# Patient Record
Sex: Male | Born: 1960 | Race: Black or African American | Hispanic: No | Smoking: Current every day smoker
Health system: Southern US, Community
[De-identification: ages and names within clinical notes are randomized; demographics above are authoritative.]

## PROBLEM LIST (undated history)

## (undated) DIAGNOSIS — Z91148 Patient's other noncompliance with medication regimen for other reason: Secondary | ICD-10-CM

## (undated) DIAGNOSIS — R0600 Dyspnea, unspecified: Secondary | ICD-10-CM

## (undated) DIAGNOSIS — G8929 Other chronic pain: Secondary | ICD-10-CM

## (undated) DIAGNOSIS — Z9289 Personal history of other medical treatment: Secondary | ICD-10-CM

## (undated) DIAGNOSIS — I509 Heart failure, unspecified: Secondary | ICD-10-CM

## (undated) DIAGNOSIS — M1990 Unspecified osteoarthritis, unspecified site: Secondary | ICD-10-CM

## (undated) DIAGNOSIS — N189 Chronic kidney disease, unspecified: Secondary | ICD-10-CM

## (undated) DIAGNOSIS — Z87828 Personal history of other (healed) physical injury and trauma: Secondary | ICD-10-CM

## (undated) DIAGNOSIS — E119 Type 2 diabetes mellitus without complications: Secondary | ICD-10-CM

## (undated) DIAGNOSIS — I499 Cardiac arrhythmia, unspecified: Secondary | ICD-10-CM

## (undated) DIAGNOSIS — E161 Other hypoglycemia: Secondary | ICD-10-CM

## (undated) DIAGNOSIS — F419 Anxiety disorder, unspecified: Secondary | ICD-10-CM

## (undated) DIAGNOSIS — M549 Dorsalgia, unspecified: Secondary | ICD-10-CM

## (undated) DIAGNOSIS — E785 Hyperlipidemia, unspecified: Secondary | ICD-10-CM

## (undated) DIAGNOSIS — E1121 Type 2 diabetes mellitus with diabetic nephropathy: Secondary | ICD-10-CM

## (undated) DIAGNOSIS — I10 Essential (primary) hypertension: Secondary | ICD-10-CM

## (undated) DIAGNOSIS — D649 Anemia, unspecified: Secondary | ICD-10-CM

## (undated) DIAGNOSIS — Q845 Enlarged and hypertrophic nails: Secondary | ICD-10-CM

## (undated) DIAGNOSIS — Z9114 Patient's other noncompliance with medication regimen: Secondary | ICD-10-CM

## (undated) HISTORY — PX: OTHER SURGICAL HISTORY: SHX169

## (undated) HISTORY — PX: VASCULAR SURGERY: SHX849

---
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
## 2009-08-08 ENCOUNTER — Emergency Department: Payer: Self-pay | Admitting: Emergency Medicine

## 2009-08-15 ENCOUNTER — Emergency Department: Payer: Self-pay | Admitting: Emergency Medicine

## 2010-02-14 ENCOUNTER — Emergency Department: Payer: Self-pay | Admitting: Emergency Medicine

## 2010-09-21 ENCOUNTER — Emergency Department: Payer: Self-pay | Admitting: Emergency Medicine

## 2010-09-25 ENCOUNTER — Emergency Department: Payer: Self-pay | Admitting: Emergency Medicine

## 2015-02-04 ENCOUNTER — Encounter: Payer: Self-pay | Admitting: Emergency Medicine

## 2015-02-04 ENCOUNTER — Emergency Department
Admission: EM | Admit: 2015-02-04 | Discharge: 2015-02-04 | Disposition: A | Discharge status: Home / Self Care | Payer: Self-pay | Attending: Emergency Medicine | Admitting: Emergency Medicine

## 2015-02-04 DIAGNOSIS — Z79899 Other long term (current) drug therapy: Secondary | ICD-10-CM | POA: Insufficient documentation

## 2015-02-04 DIAGNOSIS — E1122 Type 2 diabetes mellitus with diabetic chronic kidney disease: Secondary | ICD-10-CM

## 2015-02-04 DIAGNOSIS — E1165 Type 2 diabetes mellitus with hyperglycemia: Secondary | ICD-10-CM

## 2015-02-04 DIAGNOSIS — R202 Paresthesia of skin: Secondary | ICD-10-CM | POA: Insufficient documentation

## 2015-02-04 DIAGNOSIS — E119 Type 2 diabetes mellitus without complications: Secondary | ICD-10-CM | POA: Insufficient documentation

## 2015-02-04 DIAGNOSIS — E1169 Type 2 diabetes mellitus with other specified complication: Secondary | ICD-10-CM

## 2015-02-04 DIAGNOSIS — Q845 Enlarged and hypertrophic nails: Secondary | ICD-10-CM | POA: Insufficient documentation

## 2015-02-04 LAB — CBC WITH DIFFERENTIAL/PLATELET
BASOS ABS: 0 10*3/uL (ref 0–0.1)
BASOS PCT: 1 %
Eosinophils Absolute: 0.1 10*3/uL (ref 0–0.7)
Eosinophils Relative: 2 %
HCT: 37 % — ABNORMAL LOW (ref 40.0–52.0)
HEMOGLOBIN: 12.4 g/dL — AB (ref 13.0–18.0)
Lymphocytes Relative: 47 %
Lymphs Abs: 3.5 10*3/uL (ref 1.0–3.6)
MCH: 28 pg (ref 26.0–34.0)
MCHC: 33.7 g/dL (ref 32.0–36.0)
MCV: 83.4 fL (ref 80.0–100.0)
Monocytes Absolute: 0.4 10*3/uL (ref 0.2–1.0)
Monocytes Relative: 6 %
NEUTROS ABS: 3.4 10*3/uL (ref 1.4–6.5)
Neutrophils Relative %: 46 %
Platelets: 230 10*3/uL (ref 150–440)
RBC: 4.44 MIL/uL (ref 4.40–5.90)
RDW: 14.6 % — AB (ref 11.5–14.5)
WBC: 7.4 10*3/uL (ref 3.8–10.6)

## 2015-02-04 LAB — URINALYSIS COMPLETE WITH MICROSCOPIC (ARMC ONLY)
BACTERIA UA: NONE SEEN
Bilirubin Urine: NEGATIVE
Ketones, ur: NEGATIVE mg/dL
Leukocytes, UA: NEGATIVE
NITRITE: NEGATIVE
Protein, ur: 100 mg/dL — AB
Specific Gravity, Urine: 1.03 (ref 1.005–1.030)
pH: 5 (ref 5.0–8.0)

## 2015-02-04 LAB — BASIC METABOLIC PANEL
Anion gap: 12 (ref 5–15)
BUN: 14 mg/dL (ref 6–20)
CHLORIDE: 95 mmol/L — AB (ref 101–111)
CO2: 27 mmol/L (ref 22–32)
CREATININE: 0.92 mg/dL (ref 0.61–1.24)
Calcium: 9.8 mg/dL (ref 8.9–10.3)
GFR calc Af Amer: >60 mL/min (ref >60)
GFR calc non Af Amer: >60 mL/min (ref >60)
GLUCOSE: 437 mg/dL — AB (ref 65–99)
POTASSIUM: 3.2 mmol/L — AB (ref 3.5–5.1)
Sodium: 134 mmol/L — ABNORMAL LOW (ref 135–145)

## 2015-02-04 LAB — FERRITIN: Ferritin: 82 ng/mL (ref 24–336)

## 2015-02-04 LAB — GLUCOSE, CAPILLARY: Glucose-Capillary: 293 mg/dL — ABNORMAL HIGH (ref 65–99)

## 2015-02-04 MED ORDER — METFORMIN HCL 500 MG PO TABS: 500.0000 mg | ORAL_TABLET | Freq: Every day | ORAL | Status: DC

## 2015-02-04 MED ORDER — TRAMADOL HCL 50 MG PO TABS
ORAL_TABLET | ORAL | Status: AC
  Filled 2015-02-04: qty 1

## 2015-02-04 MED ORDER — TRAMADOL HCL 50 MG PO TABS
50.0000 mg | ORAL_TABLET | Freq: Once | ORAL | Status: AC
  Administered 2015-02-04: 50 mg via ORAL

## 2015-02-04 MED ORDER — SODIUM CHLORIDE 0.9 % IV BOLUS (SEPSIS)
1000.0000 mL | Freq: Once | INTRAVENOUS | Status: AC
  Administered 2015-02-04: 1000 mL via INTRAVENOUS

## 2015-02-04 MED ORDER — TRAZODONE HCL 50 MG PO TABS: 50.0000 mg | ORAL_TABLET | Freq: Every evening | ORAL | Status: DC | PRN

## 2015-02-04 NOTE — ED Notes (Signed)
Called lab about results of labwork; states that ferritin and BMP can be run off of labs sent

## 2015-02-04 NOTE — ED Notes (Signed)
C/o bilateral leg pain for the past 8 months or so.  Reports pain worse at night.  Concerned that he has restless leg.

## 2015-02-04 NOTE — ED Provider Notes (Signed)
[facility] Emergency Department Provider Note ____________________________________________  Time seen: 1644  I have reviewed the triage vital signs and the nursing notes.  HISTORY  Chief Complaint Leg Pain  HPI Steve Vance is a 54 y.o. male reports to the ED with his wife, with complaints of bilateral leg pain, right greater than left, for the last 8 months. He describes the pain being worse at night, noting burning and pins and needles in the legs. He describes the pain starts at his knees and works up towards his waist. He believes his symptoms are due to restless leg syndrome, this after he saw and infomercial in his wife's doctor's office waiting room last week. He is here today for evaluation management of his symptoms noting his pain keeps him awake at night. He has not dosed Tylenol, Advil, and over-the-counter sleep aids with no relief. He denies out right leg weakness, foot drop or difficulty walking.  History reviewed. No pertinent past medical history.  Patient Active Problem List   Diagnosis Date Noted  . 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 New onset type 2 diabetes mellitus 02/04/2015   History reviewed. No pertinent past surgical history.  Current Outpatient Rx  Name  Route  Sig  Dispense  Refill  . metFORMIN (GLUCOPHAGE) 500 MG tablet   Oral   Take 1 tablet (500 mg total) by mouth daily with breakfast.   30 tablet   0   . traZODone (DESYREL) 50 MG tablet   Oral   Take 1 tablet (50 mg total) by mouth at bedtime as needed for sleep.   20 tablet   0    Allergies Percocet  No family history on file.  Social History History  Substance Use Topics  . Smoking status: Never Smoker   . Smokeless tobacco: Not on file  . Alcohol Use: No   Review of Systems  Constitutional: Negative for fever. Eyes: Negative for visual changes. ENT: Negative for sore throat. Cardiovascular: Negative for chest pain. Respiratory: Negative for  shortness of breath. Gastrointestinal: Negative for abdominal pain, vomiting and diarrhea. Genitourinary: Negative for dysuria. Musculoskeletal: Negative for back pain. Positive for bilateral leg pain. Skin: Negative for rash. Neurological: Negative for headaches, focal weakness or numbness. ____________________________________________  PHYSICAL EXAM:  VITAL SIGNS: ED Triage Vitals  Enc Vitals Group     BP 02/04/15 1557 162/95 mmHg     Pulse Rate 02/04/15 1557 100     Resp 02/04/15 1557 22     Temp 02/04/15 1557 98.1 F (36.7 C)     Temp Source 02/04/15 1557 Oral     SpO2 02/04/15 1557 99 %     Weight 02/04/15 1557 250 lb (113.399 kg)     Height 02/04/15 1557 $RemoveBefor'5\' 11"'[id number]$  (1.803 m)     Head Cir --      Peak Flow --      Pain Score 02/04/15 1558 8     Pain Loc --      Pain Edu? --      Excl. in [place]? --    Constitutional: Alert and oriented. Well appearing and in no distress. Eyes: Conjunctivae are normal. PERRL. Normal extraocular movements. ENT   Head: Normocephalic and atraumatic.   Nose: No congestion/rhinnorhea.   Mouth/Throat: Mucous membranes are moist.   Neck: No stridor. Hematological/Lymphatic/Immunilogical: No cervical lymphadenopathy. Cardiovascular: Normal rate, regular rhythm. Normal distal pulses bilaterally. Respiratory: Normal respiratory effort.No wheezes/rales/rhonchi. Gastrointestinal: Soft and nontender. No distention. Musculoskeletal: Nontender with normal range of  motion in all extremities. No lower extremity tenderness nor edema.  Neurologic:  Normal speech and language. No gross focal neurologic deficits are appreciated. Normal BLE DTRs. Skin:  Skin is warm, dry and intact. No rash noted. Bilateral feet with severely overgrown, hypertrophic toenails. Psychiatric: Mood and affect are normal. Patient exhibits appropriate insight and judgment. ____________________________________________    LABS (pertinent positives/negatives) Labs Reviewed   CBC WITH DIFFERENTIAL/PLATELET - Abnormal; Notable for the following:    Hemoglobin 12.4 (*)    HCT 37.0 (*)    RDW 14.6 (*)    All other components within normal limits  BASIC METABOLIC PANEL - Abnormal; Notable for the following:    Sodium 134 (*)    Potassium 3.2 (*)    Chloride 95 (*)    Glucose, Bld 437 (*)    All other components within normal limits  URINALYSIS COMPLETEWITH MICROSCOPIC ([facility] ONLY) - Abnormal; Notable for the following:    Color, Urine YELLOW (*)    APPearance CLEAR (*)    Glucose, UA >500 (*)    Hgb urine dipstick 2+ (*)    Protein, ur 100 (*)    Squamous Epithelial / LPF 0-5 (*)    All other components within normal limits  GLUCOSE, CAPILLARY - Abnormal; Notable for the following:    Glucose-Capillary 293 (*)    All other components within normal limits  FERRITIN  HEMOGLOBIN A1C   Hgb A1c not available at time of discharge per lab. ____________________________________________  PROCEDURES   Ultram 50 mg PO  NS 1000 ml bolus x 1 ____________________________________________  INITIAL IMPRESSION / ASSESSMENT AND PLAN / ED COURSE  Clinical presentation of BLE paresthesias and insomnia due to symptoms. Patient and wife advised that there is no specific diagnostic test for RLS. Will check baseline labs and consider treatment for pain & insomnia with Trazodone. Patient to follow-up with [facility] as scheduled on February 17, 2015.  ----------------------------------------- 6:57 PM on 02/04/2015 ----------------------------------------- Labs resulted and reviewed with patient. Elevated serum glucose, consistent with probable new onset diabetes. Will add Hgb A1c and U/A. Patient now admits to nocturia and increased thirst.   Reviewed labs including UA and capillary blood sugar following IV fluid bolus with patient and wife. Symptoms, labs and exam consistent with new onset diabetes.  Patient verbalizes understanding of serious nature of this  diagnosis.  He will keep his appointment and start Metformin 500 mg PO daily.  Paresthesias may be related to new diagnosis. ____________________________________________  FINAL CLINICAL IMPRESSION(S) / ED DIAGNOSES  Final diagnoses:  Paresthesia of both lower extremities  Congenital hypertrophic nails  New onset type 2 diabetes mellitus     Melvenia Needles, PA-C 02/05/15 0033  Orbie Pyo, MD 02/06/15 [phone]

## 2015-02-04 NOTE — Discharge Instructions (Signed)
Paresthesia Paresthesia is a burning or prickling feeling. This feeling can happen in any part of the body. It often happens in the hands, arms, legs, or feet. HOME CARE  Avoid drinking alcohol.  Try massage or needle therapy (acupuncture) to help with your problems.  Keep all doctor visits as told. GET HELP RIGHT AWAY IF:   You feel weak.  You have trouble walking or moving.  You have problems speaking or seeing.  You feel confused.  You cannot control when you poop (bowel movement) or pee (urinate).  You lose feeling (numbness) after an injury.  You pass out (faint).  Your burning or prickling feeling gets worse when you walk.  You have pain, cramps, or feel dizzy.  You have a rash. MAKE SURE YOU:   Understand these instructions.  Will watch your condition.  Will get help right away if you are not doing well or get worse. Document Released: 08/05/2008 Document Revised: 11/15/2011 Document Reviewed: 05/14/2011 [facility] Patient Information 2015 [place], [place]. This information is not intended to replace advice given to you by your health care provider. Make sure you discuss any questions you have with your health care provider.  Restless Legs Syndrome Restless legs syndrome is a movement disorder. It may also be called a sensorimotor disorder.  CAUSES  No one knows what specifically causes restless legs syndrome, but it tends to run in families. It is also more common in people with low iron, in pregnancy, in people who need dialysis, and those with nerve damage (neuropathy).Some medications may make restless legs syndrome worse.Those medications include drugs to treat high blood pressure, some heart conditions, nausea, colds, allergies, and depression. SYMPTOMS Symptoms include uncomfortable sensations in the legs. These leg sensations are worse during periods of inactivity or rest. They are also worse while sitting or lying down. Individuals that have the disorder  describe sensations in the legs that feel like:  Pulling.  Drawing.  Crawling.  Worming.  Boring.  Tingling.  Pins and needles.  Prickling.  Pain. The sensations are usually accompanied by an overwhelming urge to move the legs. Sudden muscle jerks may also occur. Movement provides temporary relief from the discomfort. In rare cases, the arms may also be affected. Symptoms may interfere with going to sleep (sleep onset insomnia). Restless legs syndrome may also be related to periodic limb movement disorder (PLMD). PLMD is another more common motor disorder. It also causes interrupted sleep. The symptoms from PLMD usually occur most often when you are awake. TREATMENT  Treatment for restless legs syndrome is symptomatic. This means that the symptoms are treated.   Massage and cold compresses may provide temporary relief.  Walk, stretch, or take a cold or hot bath.  Get regular exercise and a good night's sleep.  Avoid caffeine, alcohol, nicotine, and medications that can make it worse.  Do activities that provide mental stimulation like discussions, needlework, and video games. These may be helpful if you are not able to walk or stretch. Some medications are effective in relieving the symptoms. However, many of these medications have side effects. Ask your caregiver about medications that may help your symptoms. Correcting iron deficiency may improve symptoms for some patients. Document Released: 08/13/2002 Document Revised: 01/07/2014 Document Reviewed: 11/19/2010 [facility] Patient Information 2015 [place], [place]. This information is not intended to replace advice given to you by your health care provider. Make sure you discuss any questions you have with your health care provider.  Your exam and labs were essentially normal  today. Your history and symptoms are suspicious for restless leg syndrome. There is not a particular test to confirm the diagnosis.  Take the prescription med  as directed for sleep and leg pains.  Follow-up with [facility], as scheduled, on Monday, February 17, 2015.  You should also make arrangements to see Dr. Elvina Mattes for foot care and nail trimming.  Diabetes Mellitus and Food It is important for you to manage your blood sugar (glucose) level. Your blood glucose level can be greatly affected by what you eat. Eating healthier foods in the appropriate amounts throughout the day at about the same time each day will help you control your blood glucose level. It can also help slow or prevent worsening of your diabetes mellitus. Healthy eating may even help you improve the level of your blood pressure and reach or maintain a healthy weight.  HOW CAN FOOD AFFECT ME? Carbohydrates Carbohydrates affect your blood glucose level more than any other type of food. Your dietitian will help you determine how many carbohydrates to eat at each meal and teach you how to count carbohydrates. Counting carbohydrates is important to keep your blood glucose at a healthy level, especially if you are using insulin or taking certain medicines for diabetes mellitus. Alcohol Alcohol can cause sudden decreases in blood glucose (hypoglycemia), especially if you use insulin or take certain medicines for diabetes mellitus. Hypoglycemia can be a life-threatening condition. Symptoms of hypoglycemia (sleepiness, dizziness, and disorientation) are similar to symptoms of having too much alcohol.  If your health care provider has given you approval to drink alcohol, do so in moderation and use the following guidelines:  Women should not have more than one drink per day, and men should not have more than two drinks per day. One drink is equal to:  12 oz of beer.  5 oz of wine.  1 oz of hard liquor.  Do not drink on an empty stomach.  Keep yourself hydrated. Have water, diet soda, or unsweetened iced tea.  Regular soda, juice, and other mixers might contain a lot of  carbohydrates and should be counted. WHAT FOODS ARE NOT RECOMMENDED? As you make food choices, it is important to remember that all foods are not the same. Some foods have fewer nutrients per serving than other foods, even though they might have the same number of calories or carbohydrates. It is difficult to get your body what it needs when you eat foods with fewer nutrients. Examples of foods that you should avoid that are high in calories and carbohydrates but low in nutrients include:  Trans fats (most processed foods list trans fats on the Nutrition Facts label).  Regular soda.  Juice.  Candy.  Sweets, such as cake, pie, doughnuts, and cookies.  Fried foods. WHAT FOODS CAN I EAT? Have nutrient-rich foods, which will nourish your body and keep you healthy. The food you should eat also will depend on several factors, including:  The calories you need.  The medicines you take.  Your weight.  Your blood glucose level.  Your blood pressure level.  Your cholesterol level. You also should eat a variety of foods, including:  Protein, such as meat, poultry, fish, tofu, nuts, and seeds (lean animal proteins are best).  Fruits.  Vegetables.  Dairy products, such as milk, cheese, and yogurt (low fat is best).  Breads, grains, pasta, cereal, rice, and beans.  Fats such as olive oil, trans fat-free margarine, canola oil, avocado, and olives. DOES EVERYONE WITH DIABETES  MELLITUS HAVE THE SAME MEAL PLAN? Because every person with diabetes mellitus is different, there is not one meal plan that works for everyone. It is very important that you meet with a dietitian who will help you create a meal plan that is just right for you. Document Released: 05/20/2005 Document Revised: 08/28/2013 Document Reviewed: 07/20/2013 [facility] Patient Information 2015 [place], [place]. This information is not intended to replace advice given to you by your health care provider. Make sure you discuss any  questions you have with your health care provider.  Take the new medicine as directed and follow-up with your provider as planned.  Monitor your high sugar and high carbohydrate foods in the meantime.  Return the the ED as needed.

## 2015-02-04 NOTE — ED Notes (Signed)
Per Edrick Kins PA-C pt okay to go home

## 2015-02-04 NOTE — ED Notes (Signed)
R leg pain X8 months; worse at night. Color WNL. Pt alert and oriented X4, active, cooperative, pt in NAD. RR even and unlabored, color WNL.

## 2015-02-05 LAB — HEMOGLOBIN A1C: HEMOGLOBIN A1C: 11.9 % — AB (ref 4.0–6.0)

## 2015-02-12 ENCOUNTER — Ambulatory Visit: Payer: Self-pay

## 2016-03-18 ENCOUNTER — Emergency Department: Payer: Commercial Managed Care - HMO

## 2016-03-18 ENCOUNTER — Encounter: Payer: Self-pay | Admitting: Emergency Medicine

## 2016-03-18 ENCOUNTER — Emergency Department
Admission: EM | Admit: 2016-03-18 | Discharge: 2016-03-18 | Disposition: A | Discharge status: Home / Self Care | Payer: Commercial Managed Care - HMO | Attending: Emergency Medicine | Admitting: Emergency Medicine

## 2016-03-18 DIAGNOSIS — Y999 Unspecified external cause status: Secondary | ICD-10-CM | POA: Diagnosis not present

## 2016-03-18 DIAGNOSIS — Y92488 Other paved roadways as the place of occurrence of the external cause: Secondary | ICD-10-CM | POA: Insufficient documentation

## 2016-03-18 DIAGNOSIS — I10 Essential (primary) hypertension: Secondary | ICD-10-CM | POA: Insufficient documentation

## 2016-03-18 DIAGNOSIS — Z760 Encounter for issue of repeat prescription: Secondary | ICD-10-CM | POA: Diagnosis not present

## 2016-03-18 DIAGNOSIS — E1165 Type 2 diabetes mellitus with hyperglycemia: Secondary | ICD-10-CM | POA: Insufficient documentation

## 2016-03-18 DIAGNOSIS — Z79899 Other long term (current) drug therapy: Secondary | ICD-10-CM | POA: Diagnosis not present

## 2016-03-18 DIAGNOSIS — S161XXA Strain of muscle, fascia and tendon at neck level, initial encounter: Secondary | ICD-10-CM | POA: Insufficient documentation

## 2016-03-18 DIAGNOSIS — M542 Cervicalgia: Secondary | ICD-10-CM | POA: Diagnosis present

## 2016-03-18 DIAGNOSIS — Y9389 Activity, other specified: Secondary | ICD-10-CM | POA: Diagnosis not present

## 2016-03-18 DIAGNOSIS — R739 Hyperglycemia, unspecified: Secondary | ICD-10-CM

## 2016-03-18 HISTORY — DX: Type 2 diabetes mellitus without complications: E11.9

## 2016-03-18 HISTORY — DX: Essential (primary) hypertension: I10

## 2016-03-18 LAB — URINALYSIS COMPLETE WITH MICROSCOPIC (ARMC ONLY)
Bacteria, UA: NONE SEEN
Bilirubin Urine: NEGATIVE
Glucose, UA: >500 mg/dL — AB
KETONES UR: NEGATIVE mg/dL
Leukocytes, UA: NEGATIVE
Nitrite: NEGATIVE
Protein, ur: >500 mg/dL — AB
SPECIFIC GRAVITY, URINE: 1.02 (ref 1.005–1.030)
pH: 7 (ref 5.0–8.0)

## 2016-03-18 LAB — CBC WITH DIFFERENTIAL/PLATELET
BASOS ABS: 0 10*3/uL (ref 0–0.1)
Basophils Relative: 1 %
EOS ABS: 0.2 10*3/uL (ref 0–0.7)
HCT: 37.8 % — ABNORMAL LOW (ref 40.0–52.0)
Hemoglobin: 13.7 g/dL (ref 13.0–18.0)
Lymphocytes Relative: 40 %
Lymphs Abs: 3.3 10*3/uL (ref 1.0–3.6)
MCH: 29.6 pg (ref 26.0–34.0)
MCHC: 36.2 g/dL — ABNORMAL HIGH (ref 32.0–36.0)
MCV: 81.9 fL (ref 80.0–100.0)
Monocytes Absolute: 0.5 10*3/uL (ref 0.2–1.0)
Monocytes Relative: 6 %
Neutro Abs: 4.2 10*3/uL (ref 1.4–6.5)
Neutrophils Relative %: 51 %
PLATELETS: 232 10*3/uL (ref 150–440)
RBC: 4.61 MIL/uL (ref 4.40–5.90)
RDW: 13.3 % (ref 11.5–14.5)
WBC: 8.2 10*3/uL (ref 3.8–10.6)

## 2016-03-18 LAB — BASIC METABOLIC PANEL
Anion gap: UNDETERMINED (ref 5–15)
BUN: 16 mg/dL (ref 6–20)
CO2: 28 mmol/L (ref 22–32)
Calcium: 9.7 mg/dL (ref 8.9–10.3)
Chloride: 98 mmol/L — ABNORMAL LOW (ref 101–111)
Creatinine, Ser: 1.04 mg/dL (ref 0.61–1.24)
GFR calc Af Amer: >60 mL/min (ref >60)
Glucose, Bld: 392 mg/dL — ABNORMAL HIGH (ref 65–99)
POTASSIUM: 3.5 mmol/L (ref 3.5–5.1)
SODIUM: UNDETERMINED mmol/L (ref 135–145)
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
## 2016-03-18 MED ORDER — NAPROXEN 500 MG PO TABS: 500.0000 mg | ORAL_TABLET | Freq: Two times a day (BID) | ORAL | Status: DC

## 2016-03-18 MED ORDER — METFORMIN HCL 500 MG PO TABS: 500.0000 mg | ORAL_TABLET | Freq: Two times a day (BID) | ORAL | Status: DC

## 2016-03-18 MED ORDER — AMLODIPINE BESYLATE 5 MG PO TABS: 5.0000 mg | ORAL_TABLET | Freq: Every day | ORAL | Status: DC

## 2016-03-18 MED ORDER — SODIUM CHLORIDE 0.9 % IV BOLUS (SEPSIS)
2000.0000 mL | Freq: Once | INTRAVENOUS | Status: AC
  Administered 2016-03-18: 2000 mL via INTRAVENOUS

## 2016-03-18 NOTE — Discharge Instructions (Signed)
Hyperglycemia Hyperglycemia occurs when the glucose (sugar) in your blood is too high. Hyperglycemia can happen for many reasons, but it most often happens to people who do not know they have diabetes or are not managing their diabetes properly.  CAUSES  Whether you have diabetes or not, there are other causes of hyperglycemia. Hyperglycemia can occur when you have diabetes, but it can also occur in other situations that you might not be as aware of, such as: Diabetes  If you have diabetes and are having problems controlling your blood glucose, hyperglycemia could occur because of some of the following reasons:  Not following your meal plan.  Not taking your diabetes medications or not taking it properly.  Exercising less or doing less activity than you normally do.  Being sick. Pre-diabetes  This cannot be ignored. Before people develop Type 2 diabetes, they almost always have "pre-diabetes." This is when your blood glucose levels are higher than normal, but not yet high enough to be diagnosed as diabetes. Research has shown that some long-term damage to the body, especially the heart and circulatory system, may already be occurring during pre-diabetes. If you take action to manage your blood glucose when you have pre-diabetes, you may delay or prevent Type 2 diabetes from developing. Stress  If you have diabetes, you may be "diet" controlled or on oral medications or insulin to control your diabetes. However, you may find that your blood glucose is higher than usual in the hospital whether you have diabetes or not. This is often referred to as "stress hyperglycemia." Stress can elevate your blood glucose. This happens because of hormones put out by the body during times of stress. If stress has been the cause of your high blood glucose, it can be followed regularly by your caregiver. That way he/she can make sure your hyperglycemia does not continue to get worse or progress to  diabetes. Steroids  Steroids are medications that act on the infection fighting system (immune system) to block inflammation or infection. One side effect can be a rise in blood glucose. Most people can produce enough extra insulin to allow for this rise, but for those who cannot, steroids make blood glucose levels go even higher. It is not unusual for steroid treatments to "uncover" diabetes that is developing. It is not always possible to determine if the hyperglycemia will go away after the steroids are stopped. A special blood test called an A1c is sometimes done to determine if your blood glucose was elevated before the steroids were started. SYMPTOMS  Thirsty.  Frequent urination.  Dry mouth.  Blurred vision.  Tired or fatigue.  Weakness.  Sleepy.  Tingling in feet or leg. DIAGNOSIS  Diagnosis is made by monitoring blood glucose in one or all of the following ways:  A1c test. This is a chemical found in your blood.  Fingerstick blood glucose monitoring.  Laboratory results. TREATMENT  First, knowing the cause of the hyperglycemia is important before the hyperglycemia can be treated. Treatment may include, but is not be limited to:  Education.  Change or adjustment in medications.  Change or adjustment in meal plan.  Treatment for an illness, infection, etc.  More frequent blood glucose monitoring.  Change in exercise plan.  Decreasing or stopping steroids.  Lifestyle changes. HOME CARE INSTRUCTIONS   Test your blood glucose as directed.  Exercise regularly. Your caregiver will give you instructions about exercise. Pre-diabetes or diabetes which comes on with stress is helped by exercising.  Eat wholesome,  balanced meals. Eat often and at regular, fixed times. Your caregiver or nutritionist will give you a meal plan to guide your sugar intake.  Being at an ideal weight is important. If needed, losing as little as 10 to 15 pounds may help improve blood  glucose levels. SEEK MEDICAL CARE IF:   You have questions about medicine, activity, or diet.  You continue to have symptoms (problems such as increased thirst, urination, or weight gain). SEEK IMMEDIATE MEDICAL CARE IF:   You are vomiting or have diarrhea.  Your breath smells fruity.  You are breathing faster or slower.  You are very sleepy or incoherent.  You have numbness, tingling, or pain in your feet or hands.  You have chest pain.  Your symptoms get worse even though you have been following your caregiver's orders.  If you have any other questions or concerns.   This information is not intended to replace advice given to you by your health care provider. Make sure you discuss any questions you have with your health care provider.   Document Released: 02/16/2001 Document Revised: 11/15/2011 Document Reviewed: 04/29/2015 Elsevier Interactive Patient Education 2016 [employer].  Hypertension Hypertension, commonly called high blood pressure, is when the force of blood pumping through your arteries is too strong. Your arteries are the blood vessels that carry blood from your heart throughout your body. A blood pressure reading consists of a higher number over a lower number, such as 110/72. The higher number (systolic) is the pressure inside your arteries when your heart pumps. The lower number (diastolic) is the pressure inside your arteries when your heart relaxes. Ideally you want your blood pressure below 120/80. Hypertension forces your heart to work harder to pump blood. Your arteries may become narrow or stiff. Having untreated or uncontrolled hypertension can cause heart attack, stroke, kidney disease, and other problems. RISK FACTORS Some risk factors for high blood pressure are controllable. Others are not.  Risk factors you cannot control include:   Race. You may be at higher risk if you are African American.  Age. Risk increases with age.  Gender. Men are at  higher risk than women before age 28 years. After age 47, women are at higher risk than men. Risk factors you can control include:  Not getting enough exercise or physical activity.  Being overweight.  Getting too much fat, sugar, calories, or salt in your diet.  Drinking too much alcohol. SIGNS AND SYMPTOMS Hypertension does not usually cause signs or symptoms. Extremely high blood pressure (hypertensive crisis) may cause headache, anxiety, shortness of breath, and nosebleed. DIAGNOSIS To check if you have hypertension, your health care provider will measure your blood pressure while you are seated, with your arm held at the level of your heart. It should be measured at least twice using the same arm. Certain conditions can cause a difference in blood pressure between your right and left arms. A blood pressure reading that is higher than normal on one occasion does not mean that you need treatment. If it is not clear whether you have high blood pressure, you may be asked to return on a different day to have your blood pressure checked again. Or, you may be asked to monitor your blood pressure at home for 1 or more weeks. TREATMENT Treating high blood pressure includes making lifestyle changes and possibly taking medicine. Living a healthy lifestyle can help lower high blood pressure. You may need to change some of your habits. Lifestyle changes may include:  Following the DASH diet. This diet is high in fruits, vegetables, and whole grains. It is low in salt, red meat, and added sugars.  Keep your sodium intake below 2,300 mg per day.  Getting at least 30-45 minutes of aerobic exercise at least 4 times per week.  Losing weight if necessary.  Not smoking.  Limiting alcoholic beverages.  Learning ways to reduce stress. Your health care provider may prescribe medicine if lifestyle changes are not enough to get your blood pressure under control, and if one of the following is true:  You  are 11-43 years of age and your systolic blood pressure is above 140.  You are 46 years of age or older, and your systolic blood pressure is above 150.  Your diastolic blood pressure is above 90.  You have diabetes, and your systolic blood pressure is over 102 or your diastolic blood pressure is over 90.  You have kidney disease and your blood pressure is above 140/90.  You have heart disease and your blood pressure is above 140/90. Your personal target blood pressure may vary depending on your medical conditions, your age, and other factors. HOME CARE INSTRUCTIONS  Have your blood pressure rechecked as directed by your health care provider.   Take medicines only as directed by your health care provider. Follow the directions carefully. Blood pressure medicines must be taken as prescribed. The medicine does not work as well when you skip doses. Skipping doses also puts you at risk for problems.  Do not smoke.   Monitor your blood pressure at home as directed by your health care provider. SEEK MEDICAL CARE IF:   You think you are having a reaction to medicines taken.  You have recurrent headaches or feel dizzy.  You have swelling in your ankles.  You have trouble with your vision. SEEK IMMEDIATE MEDICAL CARE IF:  You develop a severe headache or confusion.  You have unusual weakness, numbness, or feel faint.  You have severe chest or abdominal pain.  You vomit repeatedly.  You have trouble breathing. MAKE SURE YOU:   Understand these instructions.  Will watch your condition.  Will get help right away if you are not doing well or get worse.   This information is not intended to replace advice given to you by your health care provider. Make sure you discuss any questions you have with your health care provider.   Document Released: 08/23/2005 Document Revised: 01/07/2015 Document Reviewed: 06/15/2013 Elsevier Interactive Patient Education 2016 [place].  Muscle Strain A muscle strain is an injury that occurs when a muscle is stretched beyond its normal length. Usually a small number of muscle fibers are torn when this happens. Muscle strain is rated in degrees. First-degree strains have the least amount of muscle fiber tearing and pain. Second-degree and third-degree strains have increasingly more tearing and pain.  Usually, recovery from muscle strain takes 1-2 weeks. Complete healing takes 5-6 weeks.  CAUSES  Muscle strain happens when a sudden, violent force placed on a muscle stretches it too far. This may occur with lifting, sports, or a fall.  RISK FACTORS Muscle strain is especially common in athletes.  SIGNS AND SYMPTOMS At the site of the muscle strain, there may be:  Pain.  Bruising.  Swelling.  Difficulty using the muscle due to pain or lack of normal function. DIAGNOSIS  Your health care provider will perform a physical exam and ask about your medical history. TREATMENT  Often, the best treatment for  a muscle strain is resting, icing, and applying cold compresses to the injured area.  HOME CARE INSTRUCTIONS   Use the PRICE method of treatment to promote muscle healing during the first 2-3 days after your injury. The PRICE method involves:  Protecting the muscle from being injured again.  Restricting your activity and resting the injured body part.  Icing your injury. To do this, put ice in a plastic bag. Place a towel between your skin and the bag. Then, apply the ice and leave it on from 15-20 minutes each hour. After the third day, switch to moist heat packs.  Apply compression to the injured area with a splint or elastic bandage. Be careful not to wrap it too tightly. This may interfere with blood circulation or increase swelling.  Elevate the injured body part above the level of your heart as often as you can.  Only take over-the-counter or prescription medicines for pain, discomfort, or fever as directed  by your health care provider.  Warming up prior to exercise helps to prevent future muscle strains. SEEK MEDICAL CARE IF:   You have increasing pain or swelling in the injured area.  You have numbness, tingling, or a significant loss of strength in the injured area. MAKE SURE YOU:   Understand these instructions.  Will watch your condition.  Will get help right away if you are not doing well or get worse.   This information is not intended to replace advice given to you by your health care provider. Make sure you discuss any questions you have with your health care provider.   Document Released: 08/23/2005 Document Revised: 06/13/2013 Document Reviewed: 03/22/2013 Elsevier Interactive Patient Education [employer].
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
## 2016-12-30 ENCOUNTER — Observation Stay
Admission: EM | Admit: 2016-12-30 | Discharge: 2016-12-31 | Disposition: A | Discharge status: Home / Self Care | Payer: Commercial Managed Care - HMO | Attending: Internal Medicine | Admitting: Internal Medicine

## 2016-12-30 ENCOUNTER — Emergency Department: Payer: Commercial Managed Care - HMO

## 2016-12-30 ENCOUNTER — Encounter: Payer: Self-pay | Admitting: *Deleted

## 2016-12-30 DIAGNOSIS — Z6836 Body mass index (BMI) 36.0-36.9, adult: Secondary | ICD-10-CM | POA: Diagnosis not present

## 2016-12-30 DIAGNOSIS — E876 Hypokalemia: Secondary | ICD-10-CM | POA: Diagnosis not present

## 2016-12-30 DIAGNOSIS — Z7984 Long term (current) use of oral hypoglycemic drugs: Secondary | ICD-10-CM | POA: Insufficient documentation

## 2016-12-30 DIAGNOSIS — Z9119 Patient's noncompliance with other medical treatment and regimen: Secondary | ICD-10-CM | POA: Insufficient documentation

## 2016-12-30 DIAGNOSIS — F419 Anxiety disorder, unspecified: Secondary | ICD-10-CM | POA: Diagnosis not present

## 2016-12-30 DIAGNOSIS — I10 Essential (primary) hypertension: Secondary | ICD-10-CM

## 2016-12-30 DIAGNOSIS — I214 Non-ST elevation (NSTEMI) myocardial infarction: Secondary | ICD-10-CM

## 2016-12-30 DIAGNOSIS — M549 Dorsalgia, unspecified: Secondary | ICD-10-CM | POA: Insufficient documentation

## 2016-12-30 DIAGNOSIS — G8929 Other chronic pain: Secondary | ICD-10-CM | POA: Insufficient documentation

## 2016-12-30 DIAGNOSIS — E119 Type 2 diabetes mellitus without complications: Secondary | ICD-10-CM | POA: Diagnosis not present

## 2016-12-30 DIAGNOSIS — R079 Chest pain, unspecified: Principal | ICD-10-CM | POA: Diagnosis present

## 2016-12-30 DIAGNOSIS — Z79899 Other long term (current) drug therapy: Secondary | ICD-10-CM | POA: Diagnosis not present

## 2016-12-30 DIAGNOSIS — Z885 Allergy status to narcotic agent status: Secondary | ICD-10-CM | POA: Diagnosis not present

## 2016-12-30 DIAGNOSIS — Z9114 Patient's other noncompliance with medication regimen: Secondary | ICD-10-CM

## 2016-12-30 DIAGNOSIS — Z91148 Patient's other noncompliance with medication regimen for other reason: Secondary | ICD-10-CM

## 2016-12-30 HISTORY — DX: Dorsalgia, unspecified: M54.9

## 2016-12-30 HISTORY — DX: Personal history of other (healed) physical injury and trauma: Z87.828

## 2016-12-30 HISTORY — DX: Patient's other noncompliance with medication regimen: Z91.14

## 2016-12-30 HISTORY — DX: Patient's other noncompliance with medication regimen for other reason: Z91.148

## 2016-12-30 HISTORY — DX: Other chronic pain: G89.29

## 2016-12-30 HISTORY — DX: Anxiety disorder, unspecified: F41.9

## 2016-12-30 HISTORY — DX: Morbid (severe) obesity due to excess calories: E66.01

## 2016-12-30 LAB — CBC
HCT: 36.8 % — ABNORMAL LOW (ref 40.0–52.0)
Hemoglobin: 12.7 g/dL — ABNORMAL LOW (ref 13.0–18.0)
MCH: 27.4 pg (ref 26.0–34.0)
MCHC: 34.4 g/dL (ref 32.0–36.0)
MCV: 79.6 fL — ABNORMAL LOW (ref 80.0–100.0)
Platelets: 281 10*3/uL (ref 150–440)
RBC: 4.63 MIL/uL (ref 4.40–5.90)
RDW: 13.6 % (ref 11.5–14.5)
WBC: 7.9 10*3/uL (ref 3.8–10.6)

## 2016-12-30 LAB — PROTIME-INR
INR: 0.94
Prothrombin Time: 12.6 seconds (ref 11.4–15.2)

## 2016-12-30 LAB — MAGNESIUM: Magnesium: 1.6 mg/dL — ABNORMAL LOW (ref 1.7–2.4)

## 2016-12-30 LAB — BASIC METABOLIC PANEL
Anion gap: 8 (ref 5–15)
BUN: 13 mg/dL (ref 6–20)
CO2: 31 mmol/L (ref 22–32)
Calcium: 9.5 mg/dL (ref 8.9–10.3)
Chloride: 94 mmol/L — ABNORMAL LOW (ref 101–111)
Creatinine, Ser: 1.18 mg/dL (ref 0.61–1.24)
GFR calc Af Amer: >60 mL/min (ref >60)
Glucose, Bld: 313 mg/dL — ABNORMAL HIGH (ref 65–99)
POTASSIUM: 3 mmol/L — AB (ref 3.5–5.1)
Sodium: 133 mmol/L — ABNORMAL LOW (ref 135–145)

## 2016-12-30 LAB — TROPONIN I
Troponin I: 0.07 ng/mL (ref <0.03)
Troponin I: 0.07 ng/mL (ref <0.03)

## 2016-12-30 LAB — APTT: APTT: 29 s (ref 24–36)

## 2016-12-30 LAB — GLUCOSE, CAPILLARY: GLUCOSE-CAPILLARY: 265 mg/dL — AB (ref 65–99)

## 2016-12-30 IMAGING — CR DG CHEST 2V
2 series · 2 of 2 positions shown · non-contrast
Comparison: None.

CLINICAL DATA: Chest pain and shortness of breath for 6 months.

EXAM:
CHEST  2 VIEW

[chest pa]
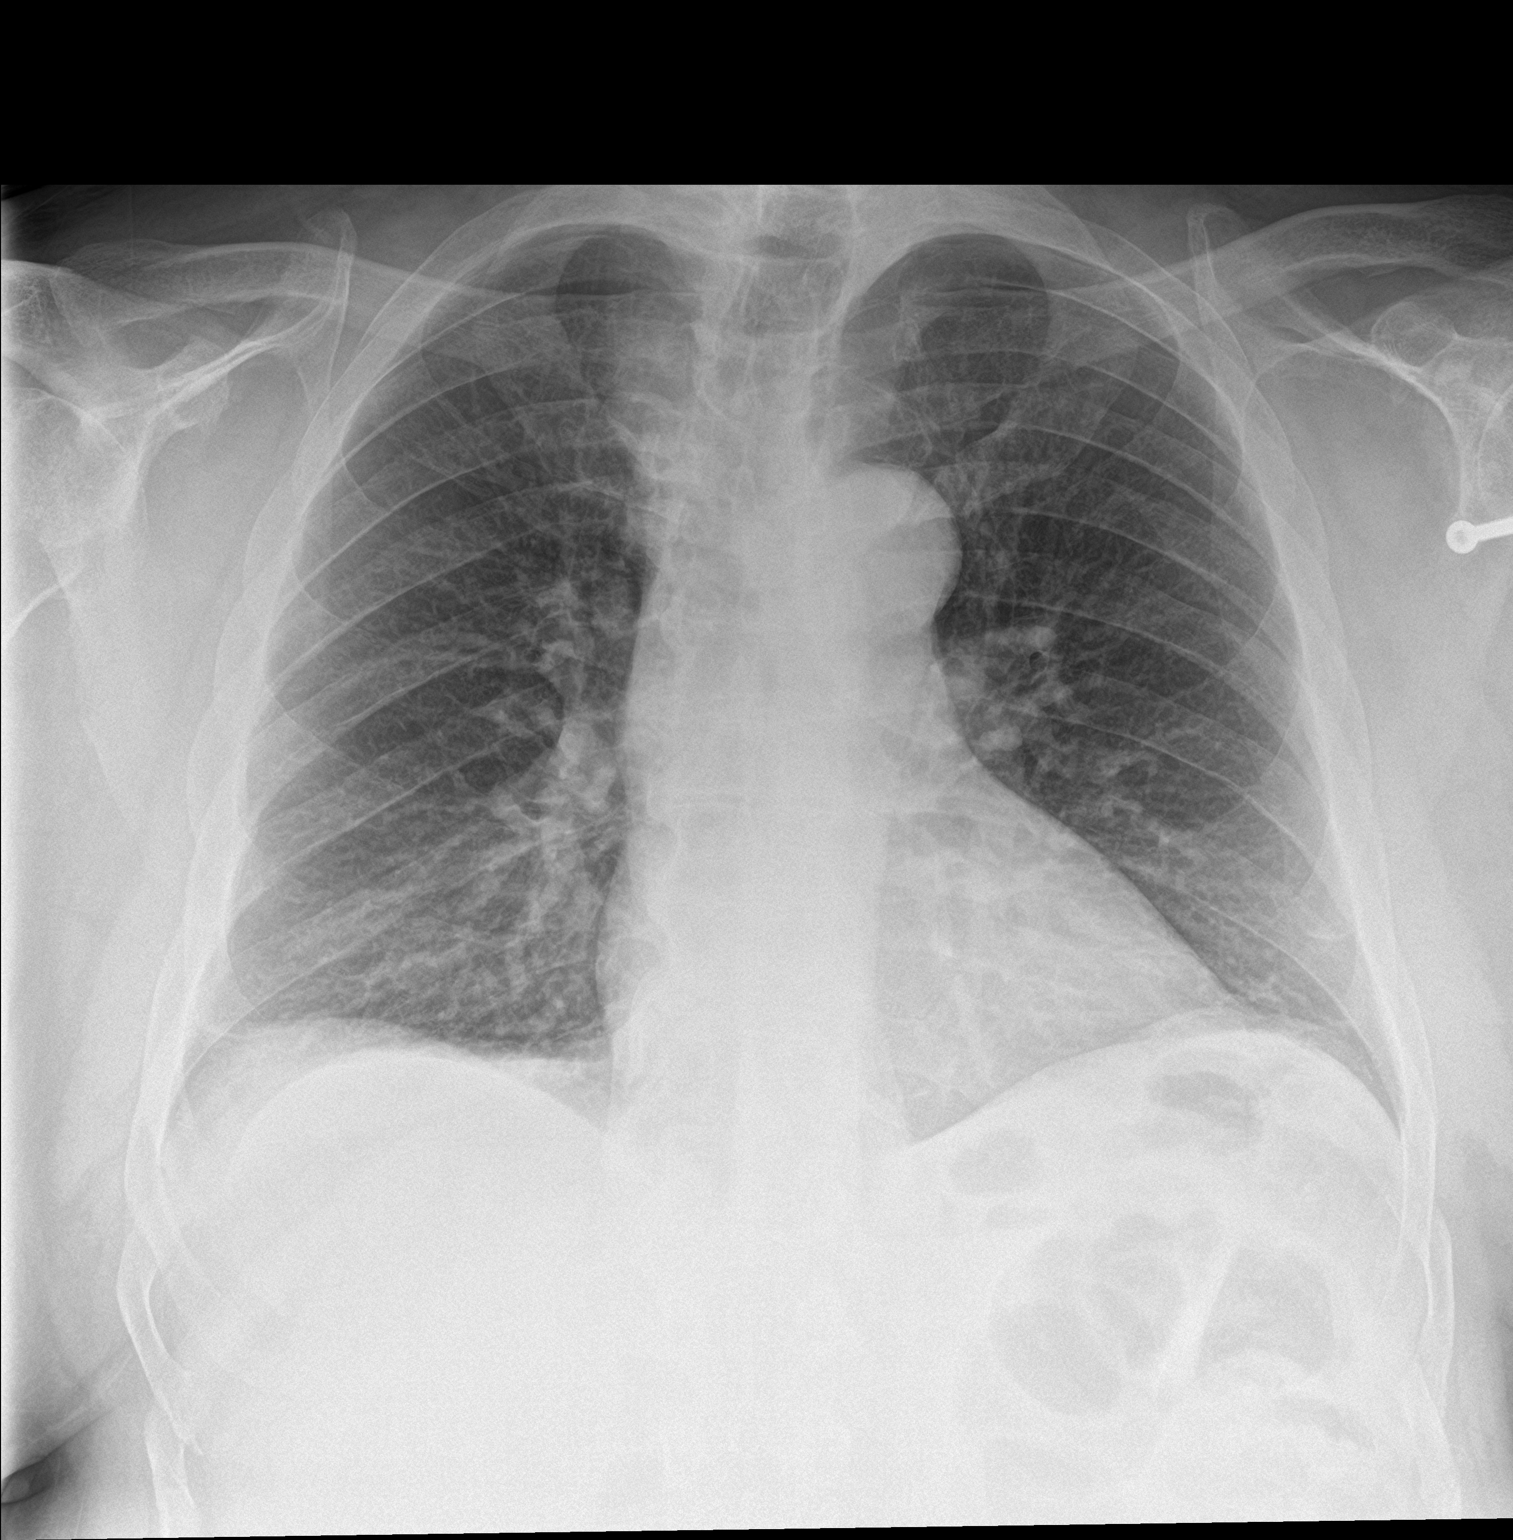

[chest lat]
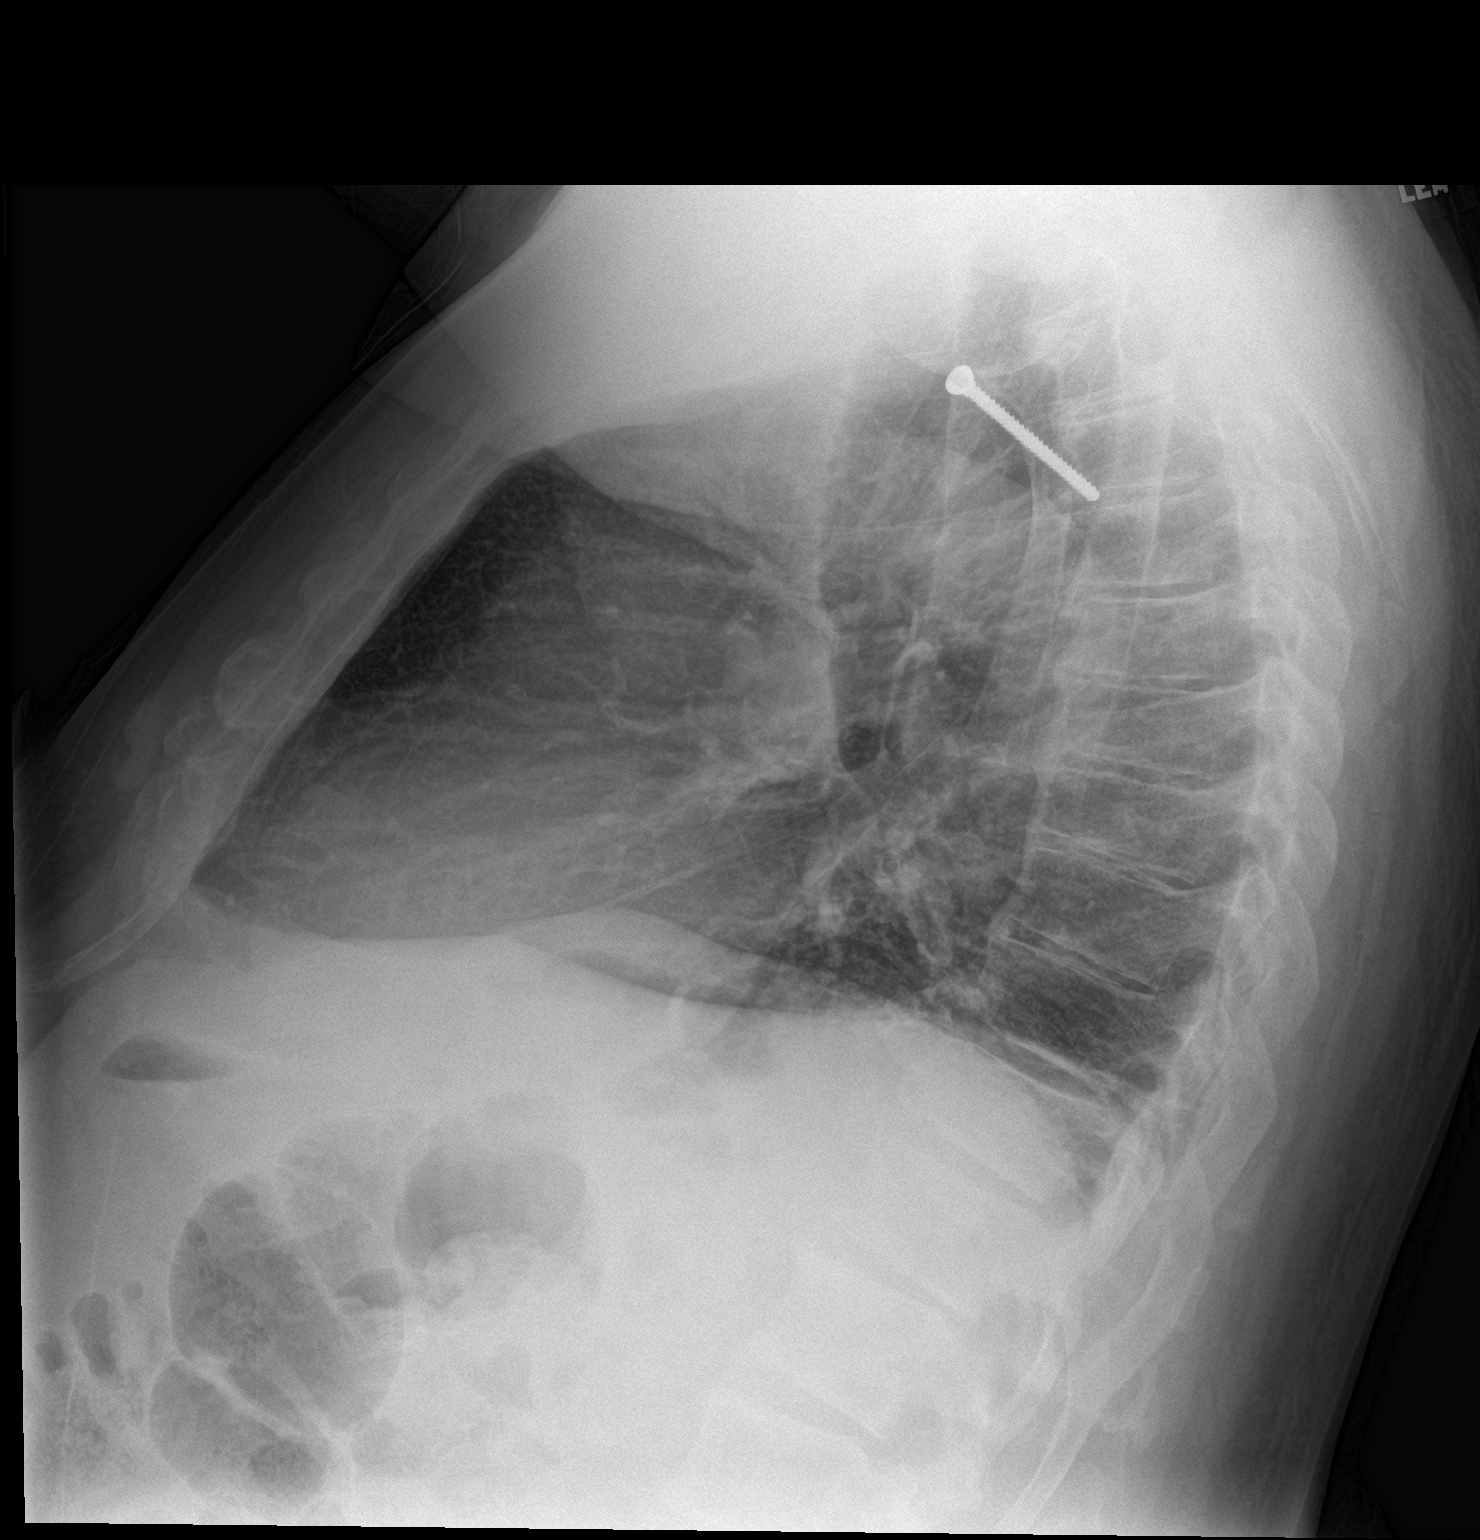

[2 of 2 positions shown; findings below may reference images not displayed]

FINDINGS: The cardiomediastinal silhouette is unremarkable.

Mild peribronchial thickening is noted.

There is no evidence of focal airspace disease, pulmonary edema,
suspicious pulmonary nodule/mass, pleural effusion, or pneumothorax.
No acute bony abnormalities are identified.

The surgical screw in the region of the left shoulder noted.
IMPRESSION: Mild peribronchial thickening -likely chronic.

No other significant cardiopulmonary process.

## 2016-12-30 MED ORDER — KETOROLAC TROMETHAMINE 15 MG/ML IJ SOLN
15.0000 mg | Freq: Four times a day (QID) | INTRAMUSCULAR | Status: DC | PRN
  Filled 2016-12-30: qty 1

## 2016-12-30 MED ORDER — ACETAMINOPHEN 325 MG PO TABS: 650.0000 mg | ORAL_TABLET | Freq: Four times a day (QID) | ORAL | Status: DC | PRN

## 2016-12-30 MED ORDER — POTASSIUM CHLORIDE CRYS ER 20 MEQ PO TBCR
40.0000 meq | EXTENDED_RELEASE_TABLET | Freq: Once | ORAL | Status: AC
  Administered 2016-12-30: 40 meq via ORAL
  Filled 2016-12-30: qty 2

## 2016-12-30 MED ORDER — ACETAMINOPHEN 650 MG RE SUPP: 650.0000 mg | Freq: Four times a day (QID) | RECTAL | Status: DC | PRN

## 2016-12-30 MED ORDER — TRAZODONE HCL 50 MG PO TABS
50.0000 mg | ORAL_TABLET | Freq: Every evening | ORAL | Status: DC | PRN
  Administered 2016-12-31: 50 mg via ORAL
  Filled 2016-12-30: qty 1

## 2016-12-30 MED ORDER — INSULIN ASPART 100 UNIT/ML ~~LOC~~ SOLN
0.0000 [IU] | Freq: Three times a day (TID) | SUBCUTANEOUS | Status: DC
  Administered 2016-12-31: 2 [IU] via SUBCUTANEOUS
  Administered 2016-12-31: 3 [IU] via SUBCUTANEOUS
  Filled 2016-12-30: qty 2
  Filled 2016-12-30: qty 3

## 2016-12-30 MED ORDER — LISINOPRIL 10 MG PO TABS
10.0000 mg | ORAL_TABLET | Freq: Every day | ORAL | Status: DC
  Administered 2016-12-31: 10 mg via ORAL
  Filled 2016-12-30: qty 1

## 2016-12-30 MED ORDER — ONDANSETRON HCL 4 MG/2ML IJ SOLN: 4.0000 mg | Freq: Four times a day (QID) | INTRAMUSCULAR | Status: DC | PRN

## 2016-12-30 MED ORDER — NITROGLYCERIN 0.4 MG SL SUBL: 0.4000 mg | SUBLINGUAL_TABLET | SUBLINGUAL | Status: DC | PRN

## 2016-12-30 MED ORDER — HEPARIN (PORCINE) IN NACL 100-0.45 UNIT/ML-% IJ SOLN
1700.0000 [IU]/h | INTRAMUSCULAR | Status: DC
  Administered 2016-12-30: 1300 [IU]/h via INTRAVENOUS
  Filled 2016-12-30 (×3): qty 250

## 2016-12-30 MED ORDER — METOPROLOL TARTRATE 25 MG PO TABS
25.0000 mg | ORAL_TABLET | Freq: Two times a day (BID) | ORAL | Status: DC
Start: 2016-12-30 — End: 2016-12-31
  Administered 2016-12-30 – 2016-12-31 (×2): 25 mg via ORAL
  Filled 2016-12-30 (×2): qty 1

## 2016-12-30 MED ORDER — POTASSIUM CHLORIDE CRYS ER 20 MEQ PO TBCR
20.0000 meq | EXTENDED_RELEASE_TABLET | Freq: Two times a day (BID) | ORAL | Status: DC
  Administered 2016-12-30 – 2016-12-31 (×2): 20 meq via ORAL
  Filled 2016-12-30 (×2): qty 1

## 2016-12-30 MED ORDER — HEPARIN BOLUS VIA INFUSION
4000.0000 [IU] | Freq: Once | INTRAVENOUS | Status: AC
  Administered 2016-12-30: 4000 [IU] via INTRAVENOUS
  Filled 2016-12-30: qty 4000

## 2016-12-30 MED ORDER — ASPIRIN 81 MG PO CHEW
324.0000 mg | CHEWABLE_TABLET | Freq: Once | ORAL | Status: AC
  Administered 2016-12-30: 324 mg via ORAL
  Filled 2016-12-30: qty 4

## 2016-12-30 MED ORDER — NITROGLYCERIN 0.4 MG SL SUBL
0.4000 mg | SUBLINGUAL_TABLET | SUBLINGUAL | Status: DC | PRN
  Administered 2016-12-30: 0.4 mg via SUBLINGUAL
  Filled 2016-12-30: qty 1

## 2016-12-30 MED ORDER — ASPIRIN EC 325 MG PO TBEC
DELAYED_RELEASE_TABLET | ORAL | Status: AC
  Filled 2016-12-30: qty 1

## 2016-12-30 MED ORDER — SODIUM CHLORIDE 0.9% FLUSH
3.0000 mL | Freq: Two times a day (BID) | INTRAVENOUS | Status: DC
  Administered 2016-12-30 – 2016-12-31 (×2): 3 mL via INTRAVENOUS

## 2016-12-30 MED ORDER — NITROGLYCERIN IN D5W 200-5 MCG/ML-% IV SOLN: 10.0000 ug/min | INTRAVENOUS | Status: DC

## 2016-12-30 MED ORDER — ONDANSETRON HCL 4 MG PO TABS: 4.0000 mg | ORAL_TABLET | Freq: Four times a day (QID) | ORAL | Status: DC | PRN

## 2016-12-30 MED ORDER — HYDRALAZINE HCL 20 MG/ML IJ SOLN: 10.0000 mg | Freq: Four times a day (QID) | INTRAMUSCULAR | Status: DC | PRN

## 2016-12-30 MED ORDER — ASPIRIN EC 81 MG PO TBEC
81.0000 mg | DELAYED_RELEASE_TABLET | Freq: Every day | ORAL | Status: DC
  Administered 2016-12-31: 81 mg via ORAL
  Filled 2016-12-30: qty 1

## 2016-12-30 MED ORDER — AMLODIPINE BESYLATE 5 MG PO TABS
5.0000 mg | ORAL_TABLET | Freq: Every day | ORAL | Status: DC
  Administered 2016-12-31: 5 mg via ORAL
  Filled 2016-12-30: qty 1

## 2016-12-30 MED ORDER — METFORMIN HCL 500 MG PO TABS: 500.0000 mg | ORAL_TABLET | Freq: Two times a day (BID) | ORAL | Status: DC

## 2016-12-30 MED ORDER — INSULIN ASPART 100 UNIT/ML ~~LOC~~ SOLN
0.0000 [IU] | Freq: Every day | SUBCUTANEOUS | Status: DC
  Administered 2016-12-30: 3 [IU] via SUBCUTANEOUS
  Filled 2016-12-30: qty 3

## 2016-12-30 NOTE — ED Notes (Signed)
Critical troponin of 0.07 given to Dr Darl Householder with verbal acknowledge.

## 2016-12-30 NOTE — Progress Notes (Signed)
ANTICOAGULATION CONSULT NOTE - Initial Consult  Pharmacy Consult for heparin drip  Indication: chest pain/ACS  Allergies  Allergen Reactions  . Percocet [Oxycodone-Acetaminophen] Nausea And Vomiting    Patient Measurements: Height: 5\' 11"  (180.3 cm) Weight: 260 lb (117.9 kg) IBW/kg (Calculated) : 75.3 Heparin Dosing Weight: 100kg  Vital Signs: Temp: 98.4 F (36.9 C) (04/26 1536) Temp Source: Oral (04/26 1536) BP: 184/106 (04/26 1730) Pulse Rate: 91 (04/26 1730)  Labs:  Recent Labs  12/30/16 1545 12/30/16 1705  HGB 12.7*  --   HCT 36.8*  --   PLT 281  --   APTT  --  29  LABPROT  --  12.6  INR  --  0.94  CREATININE 1.18  --   TROPONINI 0.07*  --     Estimated Creatinine Clearance: 90.2 mL/min (by C-G formula based on SCr of 1.18 mg/dL).   Medical History: Past Medical History:  Diagnosis Date  . Diabetes mellitus without complication ([place])   . Hypertension      Assessment: 56 yo male with chest pain. Pharmacy consulted for heparin dosing and monitoring.   Goal of Therapy:  Heparin level 0.3-0.7 units/ml Monitor platelets by anticoagulation protocol: Yes   Plan:  Baseline labs ordered Dosing weight:100kg Give 4000 units bolus x 1 Start heparin infusion at 1300 units/hr Check anti-Xa level in 6 hours and daily while on heparin Continue to monitor H&H and platelets  Pernell Dupre, PharmD, BCPS Clinical Pharmacist 12/30/2016 5:56 PM
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
## 2016-12-30 NOTE — ED Triage Notes (Signed)
Pt to triage via wheelchair.  Pt reports having chest pain for several weeks.  Pt also states sob.  Nonsmoker.  No n/v/d.  nonradiatng pain in chest   Pt alert

## 2016-12-30 NOTE — H&P (Addendum)
[place] NAME: Steve Vance    MR#:  [id number]  DATE OF BIRTH:  [birth year]  DATE OF ADMISSION:  12/30/2016  PRIMARY CARE PHYSICIAN:  Dr. Steele Sizer  REQUESTING/REFERRING PHYSICIAN: Dr. Shirlyn Goltz  CHIEF COMPLAINT:   Chief Complaint  Patient presents with  . Chest Pain    HISTORY OF PRESENT ILLNESS:  Steve Vance  is a 56 y.o. male with a known history of Diabetes, essential hypertension, medical noncompliance who presents to the hospital due to intermittent chest pain. Patient says he's been having chest pain on and off for the past 3-4 months but he had an episode like 2 days ago that was significantly worse than usual and therefore came to the ER for further evaluation. He describes the pain as being sharp in the nature in the center of his chest nonradiating. Not associated with any nausea, vomiting, diaphoresis, palpitations or syncope. Patient denies ever having a heart attack or CVA before. He called his primary care physician who advised to come to the ER for further evaluation. Patient emotional was noted to have significantly elevated blood pressure with systolic blood pressure in 200, he was noted to have a mildly elevated troponin of 0.07 and given his risk factors hospitalist services were contacted further treatment and evaluation.  PAST MEDICAL HISTORY:   Past Medical History:  Diagnosis Date  . Diabetes mellitus without complication ([place])   . Hypertension     PAST SURGICAL HISTORY:  No past surgical history on file.  SOCIAL HISTORY:   Social History  Substance Use Topics  . Smoking status: Never Smoker  . Smokeless tobacco: Never Used  . Alcohol use No    FAMILY HISTORY:   Family History  Problem Relation Age of Onset  . Heart failure Mother   . Cancer Father     DRUG ALLERGIES:   Allergies  Allergen Reactions  . Percocet [Oxycodone-Acetaminophen] Nausea And Vomiting    REVIEW OF  SYSTEMS:   Review of Systems  Constitutional: Negative for fever and weight loss.  HENT: Negative for congestion, nosebleeds and tinnitus.   Eyes: Negative for blurred vision, double vision and redness.  Respiratory: Negative for cough, hemoptysis and shortness of breath.   Cardiovascular: Positive for chest pain. Negative for orthopnea, leg swelling and PND.  Gastrointestinal: Negative for abdominal pain, diarrhea, melena, nausea and vomiting.  Genitourinary: Negative for dysuria, hematuria and urgency.  Musculoskeletal: Negative for falls and joint pain.  Neurological: Negative for dizziness, tingling, sensory change, focal weakness, seizures, weakness and headaches.  Endo/Heme/Allergies: Negative for polydipsia. Does not bruise/bleed easily.  Psychiatric/Behavioral: Negative for depression and memory loss. The patient is not nervous/anxious.     MEDICATIONS AT HOME:   Prior to Admission medications   Medication Sig Start Date End Date Taking? Authorizing Provider  amLODipine (NORVASC) 5 MG tablet Take 1 tablet (5 mg total) by mouth daily. 03/18/16   Carrie Mew, MD  metFORMIN (GLUCOPHAGE) 500 MG tablet Take 1 tablet (500 mg total) by mouth 2 (two) times daily with a meal. 03/18/16   Carrie Mew, MD  naproxen (NAPROSYN) 500 MG tablet Take 1 tablet (500 mg total) by mouth 2 (two) times daily with a meal. 03/18/16   Carrie Mew, MD  traZODone (DESYREL) 50 MG tablet Take 1 tablet (50 mg total) by mouth at bedtime as needed for sleep. 02/04/15   Dannielle Karvonen Menshew, PA-C      VITAL SIGNS:  Blood  pressure (!) 193/119, pulse 92, temperature 98.4 F (36.9 C), temperature source Oral, resp. rate (!) 22, height $RemoveBe'5\' 11"'LepRAhwyl$  (1.803 m), weight 117.9 kg (260 lb), SpO2 98 %.  PHYSICAL EXAMINATION:  Physical Exam  GENERAL:  56 y.o.-year-old obese patient lying in bed in no acute distress.  EYES: Pupils equal, round, reactive to light and accommodation. No scleral icterus. Extraocular  muscles intact.  HEENT: Head atraumatic, normocephalic. Oropharynx and nasopharynx clear. No oropharyngeal erythema, moist oral mucosa  NECK:  Supple, no jugular venous distention. No thyroid enlargement, no tenderness.  LUNGS: Normal breath sounds bilaterally, no wheezing, rales, rhonchi. No use of accessory muscles of respiration.  CARDIOVASCULAR: S1, S2 RRR. No murmurs, rubs, gallops, clicks.  ABDOMEN: Soft, nontender, nondistended. Bowel sounds present. No organomegaly or mass.  EXTREMITIES: No pedal edema, cyanosis, or clubbing. + 2 pedal & radial pulses b/l.   NEUROLOGIC: Cranial nerves II through XII are intact. No focal Motor or sensory deficits appreciated b/l PSYCHIATRIC: The patient is alert and oriented x 3.  SKIN: No obvious rash, lesion, or ulcer.   LABORATORY PANEL:   CBC  Recent Labs Lab 12/30/16 1545  WBC 7.9  HGB 12.7*  HCT 36.8*  PLT 281   ------------------------------------------------------------------------------------------------------------------  Chemistries   Recent Labs Lab 12/30/16 1545  NA 133*  K 3.0*  CL 94*  CO2 31  GLUCOSE 313*  BUN 13  CREATININE 1.18  CALCIUM 9.5   ------------------------------------------------------------------------------------------------------------------  Cardiac Enzymes  Recent Labs Lab 12/30/16 1545  TROPONINI 0.07*   ------------------------------------------------------------------------------------------------------------------  RADIOLOGY:  Dg Chest 2 View  Result Date: 12/30/2016 CLINICAL DATA:  Chest pain and shortness of breath for 6 months. EXAM: CHEST  2 VIEW COMPARISON:  None. FINDINGS: The cardiomediastinal silhouette is unremarkable. Mild peribronchial thickening is noted. There is no evidence of focal airspace disease, pulmonary edema, suspicious pulmonary nodule/mass, pleural effusion, or pneumothorax. No acute bony abnormalities are identified. The surgical screw in the region of the left  shoulder noted. IMPRESSION: Mild peribronchial thickening -likely chronic. No other significant cardiopulmonary process. Electronically Signed   By: Margarette Canada M.D.   On: 12/30/2016 16:23     IMPRESSION AND PLAN:   56 year old male with past medical history of diabetes, hypertension who presents to the hospital due to intermittent chest pain.   1. Chest pain-patient does have significant risk factors given his diabetes, hypertension and medical noncompliance. -His EKG shows LVH by voltage criteria. There are no acute ST or T-wave changes consistent with ischemia. He is a mildly elevated troponin. -I'll observe him on telemetry, cycle his cardiac markers. Continue aspirin, nitroglycerin, beta blocker, heparin gtt.  -I will check a two-dimensional echocardiogram, get a cardiology consult, I will go ahead and order stress test for tomorrow but if his markers turn positive this likely needs to be discontinued.  2. Accelerated hypertension-secondary to medical noncompliance. -I will continue his Norvasc, add some low-dose ace inhibitor given his diabetes. Also place him on some low-dose beta blocker, and as needed IV hydralazine.  3. DM Type II without complication - place on SSI.  - hold Metformin.   4. Hypokalemia - will place on Oral potassium supplements. Repeat level in a.m.  - check Mg. Level.    All the records are reviewed and case discussed with ED provider. Management plans discussed with the patient, family and they are in agreement.  CODE STATUS: full code  TOTAL TIME TAKING CARE OF THIS PATIENT: 40 minutes.    Henreitta Leber M.D on 12/30/2016  at 5:41 PM  Between 7am to 6pm - Pager - [phone]  After 6pm go to [email or web address] - password [email or web address] [facility]  [facility]  Office  [phone]  CC: Primary care physician; No PCP Per Patient

## 2016-12-31 ENCOUNTER — Observation Stay (HOSPITAL_BASED_OUTPATIENT_CLINIC_OR_DEPARTMENT_OTHER)
Admit: 2016-12-31 | Discharge: 2016-12-31 | Disposition: A | Discharge status: Home / Self Care | Payer: Commercial Managed Care - HMO | Attending: Specialist | Admitting: Specialist

## 2016-12-31 ENCOUNTER — Inpatient Hospital Stay (HOSPITAL_BASED_OUTPATIENT_CLINIC_OR_DEPARTMENT_OTHER): Payer: Commercial Managed Care - HMO

## 2016-12-31 ENCOUNTER — Encounter: Payer: Self-pay | Admitting: Nurse Practitioner

## 2016-12-31 DIAGNOSIS — E1165 Type 2 diabetes mellitus with hyperglycemia: Secondary | ICD-10-CM

## 2016-12-31 DIAGNOSIS — I10 Essential (primary) hypertension: Secondary | ICD-10-CM

## 2016-12-31 DIAGNOSIS — Z9114 Patient's other noncompliance with medication regimen: Secondary | ICD-10-CM

## 2016-12-31 DIAGNOSIS — R079 Chest pain, unspecified: Secondary | ICD-10-CM

## 2016-12-31 DIAGNOSIS — E782 Mixed hyperlipidemia: Secondary | ICD-10-CM | POA: Diagnosis not present

## 2016-12-31 LAB — NM MYOCAR MULTI W/SPECT W/WALL MOTION / EF
CHL CUP NUCLEAR SDS: 0
CHL CUP NUCLEAR SRS: 1
CHL CUP NUCLEAR SSS: 0
CSEPED: 0 min
CSEPPHR: 96 {beats}/min
Estimated workload: 1 METS
Exercise duration (sec): 0 s
LV dias vol: 178 mL (ref 62–150)
LVSYSVOL: 100 mL
MPHR: 163 {beats}/min
Percent HR: 58 %
Rest HR: 68 {beats}/min
TID: 1.09

## 2016-12-31 LAB — HEMOGLOBIN A1C
Hgb A1c MFr Bld: 12.2 % — ABNORMAL HIGH (ref 4.8–5.6)
Mean Plasma Glucose: 303 mg/dL

## 2016-12-31 LAB — GLUCOSE, CAPILLARY
Glucose-Capillary: 215 mg/dL — ABNORMAL HIGH (ref 65–99)
Glucose-Capillary: 187 mg/dL — ABNORMAL HIGH (ref 65–99)

## 2016-12-31 LAB — BASIC METABOLIC PANEL
ANION GAP: 7 (ref 5–15)
BUN: 17 mg/dL (ref 6–20)
CHLORIDE: 98 mmol/L — AB (ref 101–111)
CO2: 30 mmol/L (ref 22–32)
Calcium: 9.2 mg/dL (ref 8.9–10.3)
Creatinine, Ser: 1.39 mg/dL — ABNORMAL HIGH (ref 0.61–1.24)
GFR, EST NON AFRICAN AMERICAN: 55 mL/min — AB (ref >60)
Glucose, Bld: 235 mg/dL — ABNORMAL HIGH (ref 65–99)
POTASSIUM: 3.4 mmol/L — AB (ref 3.5–5.1)
Sodium: 135 mmol/L (ref 135–145)

## 2016-12-31 LAB — CBC
HEMATOCRIT: 37.1 % — AB (ref 40.0–52.0)
HEMOGLOBIN: 12.9 g/dL — AB (ref 13.0–18.0)
MCH: 27.5 pg (ref 26.0–34.0)
MCHC: 34.7 g/dL (ref 32.0–36.0)
MCV: 79.3 fL — ABNORMAL LOW (ref 80.0–100.0)
Platelets: 284 10*3/uL (ref 150–440)
RBC: 4.68 MIL/uL (ref 4.40–5.90)
RDW: 14.2 % (ref 11.5–14.5)
WBC: 8.7 10*3/uL (ref 3.8–10.6)

## 2016-12-31 LAB — LIPID PANEL
CHOLESTEROL: 135 mg/dL (ref 0–200)
HDL: 32 mg/dL — ABNORMAL LOW (ref >40)
LDL Cholesterol: 57 mg/dL (ref 0–99)
Total CHOL/HDL Ratio: 4.2 RATIO
Triglycerides: 232 mg/dL — ABNORMAL HIGH (ref <150)
VLDL: 46 mg/dL — ABNORMAL HIGH (ref 0–40)

## 2016-12-31 LAB — HEPARIN LEVEL (UNFRACTIONATED)
HEPARIN UNFRACTIONATED: 0.12 [IU]/mL — AB (ref 0.30–0.70)
Heparin Unfractionated: 0.29 IU/mL — ABNORMAL LOW (ref 0.30–0.70)

## 2016-12-31 LAB — ECHOCARDIOGRAM COMPLETE
HEIGHTINCHES: 71 in
WEIGHTICAEL: 4160 [oz_av]

## 2016-12-31 LAB — HIV ANTIBODY (ROUTINE TESTING W REFLEX): HIV SCREEN 4TH GENERATION: NONREACTIVE

## 2016-12-31 LAB — TROPONIN I
TROPONIN I: 0.07 ng/mL — AB (ref <0.03)
Troponin I: 0.06 ng/mL (ref <0.03)

## 2016-12-31 MED ORDER — TECHNETIUM TC 99M TETROFOSMIN IV KIT
13.0000 | PACK | Freq: Once | INTRAVENOUS | Status: AC | PRN
  Administered 2016-12-31: 13.984 via INTRAVENOUS

## 2016-12-31 MED ORDER — LISINOPRIL 10 MG PO TABS: 10.0000 mg | ORAL_TABLET | Freq: Every day | ORAL | 0 refills | Status: DC

## 2016-12-31 MED ORDER — REGADENOSON 0.4 MG/5ML IV SOLN
0.4000 mg | Freq: Once | INTRAVENOUS | Status: DC
  Filled 2016-12-31: qty 5

## 2016-12-31 MED ORDER — TECHNETIUM TC 99M TETROFOSMIN IV KIT
32.9900 | PACK | Freq: Once | INTRAVENOUS | Status: AC | PRN
  Administered 2016-12-31: 32.99 via INTRAVENOUS

## 2016-12-31 MED ORDER — ASPIRIN 81 MG PO TBEC: 81.0000 mg | DELAYED_RELEASE_TABLET | Freq: Every day | ORAL | 0 refills | Status: DC

## 2016-12-31 MED ORDER — REGADENOSON 0.4 MG/5ML IV SOLN
0.4000 mg | Freq: Once | INTRAVENOUS | Status: AC
  Administered 2016-12-31: 0.4 mg via INTRAVENOUS
  Filled 2016-12-31: qty 5

## 2016-12-31 MED ORDER — HEPARIN BOLUS VIA INFUSION
3000.0000 [IU] | Freq: Once | INTRAVENOUS | Status: AC
  Administered 2016-12-31: 3000 [IU] via INTRAVENOUS
  Filled 2016-12-31: qty 3000

## 2016-12-31 MED ORDER — MAGNESIUM SULFATE 2 GM/50ML IV SOLN
2.0000 g | Freq: Once | INTRAVENOUS | Status: AC
  Administered 2016-12-31: 2 g via INTRAVENOUS
  Filled 2016-12-31: qty 50

## 2016-12-31 MED ORDER — METOPROLOL TARTRATE 25 MG PO TABS: 25.0000 mg | ORAL_TABLET | Freq: Two times a day (BID) | ORAL | 0 refills | Status: DC

## 2016-12-31 MED ORDER — HEPARIN BOLUS VIA INFUSION
1500.0000 [IU] | Freq: Once | INTRAVENOUS | Status: AC
  Administered 2016-12-31: 1500 [IU] via INTRAVENOUS
  Filled 2016-12-31: qty 1500

## 2016-12-31 NOTE — Progress Notes (Signed)
Discharge instructions along with home medication list and follow up gone over in great detail with patient. Patient verbalized that he understood instructions. Printed rx given to patient. Iv removed x2, telemetry removed. No c/o pain no distress. Patient will be discharged home with family.

## 2016-12-31 NOTE — Progress Notes (Signed)
Spoke with dr. Rockey Situ. Per md patient okay to be discharged home stress test negative and echo result read.

## 2016-12-31 NOTE — Progress Notes (Signed)
MD Gollon notified. RN verified MD wanted heparin gtt discontinued. I will discontinue heparin gtt. I will continue to assess.
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
## 2016-12-31 NOTE — Progress Notes (Signed)
ANTICOAGULATION CONSULT NOTE - Initial Consult  Pharmacy Consult for heparin drip  Indication: chest pain/ACS      Allergies  Allergen Reactions  . Percocet [Oxycodone-Acetaminophen] Nausea And Vomiting    Patient Measurements: Height: 5\' 11"  (180.3 cm) Weight: 260 lb (117.9 kg) IBW/kg (Calculated) : 75.3 Heparin Dosing Weight: 100kg  Vital Signs: Temp: 98.4 F (36.9 C) (04/26 1536) Temp Source: Oral (04/26 1536) BP: 184/106 (04/26 1730) Pulse Rate: 91 (04/26 1730)  Labs:  Recent Labs (last 2 labs)    Recent Labs  12/30/16 1545 12/30/16 1705  HGB 12.7*  --   HCT 36.8*  --   PLT 281  --   APTT  --  29  LABPROT  --  12.6  INR  --  0.94  CREATININE 1.18  --   TROPONINI 0.07*  --       Estimated Creatinine Clearance: 90.2 mL/min (by C-G formula based on SCr of 1.18 mg/dL).   Medical History:     Past Medical History:  Diagnosis Date  . Diabetes mellitus without complication ([place])   . Hypertension      Assessment: 56 yo male with chest pain. Pharmacy consulted for heparin dosing and monitoring.   Goal of Therapy:  Heparin level 0.3-0.7 units/ml Monitor platelets by anticoagulation protocol: Yes   Plan:  Baseline labs ordered Dosing weight:100kg Give 4000 units bolus x 1 Start heparin infusion at 1300 units/hr Check anti-Xa level in 6 hours and daily while on heparin Continue to monitor H&H and platelets  4/27 @ 2346 HL 0.12 subtherapeutic. Will rebolus w/ heparin 3000 units IV x 1 and will increase rate to 1500 units/hr and will recheck HL @ 0600.  Thank you for this consult.  Tobie Lords, PharmD, BCPS Clinical Pharmacist 12/31/2016

## 2016-12-31 NOTE — Progress Notes (Signed)
*  PRELIMINARY RESULTS* Echocardiogram 2D Echocardiogram has been performed.  Sherrie Sport 12/31/2016, 2:56 PM

## 2016-12-31 NOTE — Progress Notes (Signed)
[facility] for heparin drip  Indication: chest pain/ACS  Allergies  Allergen Reactions  . Percocet [Oxycodone-Acetaminophen] Nausea And Vomiting    Patient Measurements: Height: 5\' 11"  (180.3 cm) Weight: 260 lb (117.9 kg) IBW/kg (Calculated) : 75.3 Heparin Dosing Weight: 100kg  Vital Signs: Temp: 98.3 F (36.8 C) (04/27 0501) Temp Source: Oral (04/27 0501) BP: 167/86 (04/27 0501) Pulse Rate: 82 (04/27 0501)  Labs:  Recent Labs  12/30/16 1545 12/30/16 1705 12/30/16 1912 12/30/16 2345 12/31/16 0250 12/31/16 0610  HGB 12.7*  --   --   --  12.9*  --   HCT 36.8*  --   --   --  37.1*  --   PLT 281  --   --   --  284  --   APTT  --  29  --   --   --   --   LABPROT  --  12.6  --   --   --   --   INR  --  0.94  --   --   --   --   HEPARINUNFRC  --   --   --  0.12*  --  0.29*  CREATININE 1.18  --   --   --  1.39*  --   TROPONINI 0.07*  --  0.07* 0.06* 0.07*  --     Estimated Creatinine Clearance: 76.5 mL/min (A) (by C-G formula based on SCr of 1.39 mg/dL (H)).   Medical History: Past Medical History:  Diagnosis Date  . Diabetes mellitus without complication ([place])   . Hypertension      Assessment: 56 yo male with chest pain. Pharmacy consulted for heparin dosing and monitoring.   Goal of Therapy:  Heparin level 0.3-0.7 units/ml Monitor platelets by anticoagulation protocol: Yes   Plan:  Baseline labs ordered Dosing weight:100kg Give 4000 units bolus x 1 Start heparin infusion at 1300 units/hr Check anti-Xa level in 6 hours and daily while on heparin Continue to monitor H&H and platelets   4/27 @ 2346 HL 0.12 subtherapeutic. Will rebolus w/ heparin 3000 units IV x 1 and will increase rate to 1500 units/hr and will recheck HL @ 0600.  4/27 HL@ 0610= 0.29. Will give Heparin bolus of 1500 units x 1 and increase rate to 1700 units/hr. Will recheck in 6 hours at 1400.    Noralee Space, PharmD, BCPS Clinical  Pharmacist 12/31/2016 7:18 AM
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
## 2016-12-31 NOTE — Progress Notes (Signed)
Inpatient Diabetes Program Recommendations  AACE/ADA: New Consensus Statement on Inpatient Glycemic Control (2015)  Target Ranges:  Prepandial:   less than 140 mg/dL      Peak postprandial:   less than 180 mg/dL (1-2 hours)      Critically ill patients:  140 - 180 mg/dL   Lab Results  Component Value Date   GLUCAP 215 (H) 12/31/2016   HGBA1C 11.9 (H) 02/04/2015    Review of Glycemic Control  Results for AMEAR, STROJNY (MRN [id number]) as of 12/31/2016 08:31  Ref. Range 12/30/2016 21:07 12/31/2016 07:40  Glucose-Capillary Latest Ref Range: 65 - 99 mg/dL 265 (H) 215 (H)    Diabetes history: Type 2, A1C pending Outpatient Diabetes medications: Metformin 500mg  bid Current orders for Inpatient glycemic control: Metformin 500mg  bid, Novolog 0-9 units tid , Novolog 0-5 units qhs  Inpatient Diabetes Program Recommendations:  Please d/c Metformin while patient is in the hospital.   Gentry Fitz, RN, BA, MHA, CDE Diabetes Coordinator Inpatient Diabetes Program  [phone] (Team Pager) [phone] ([place]) 12/31/2016 8:33 AM

## 2017-01-07 ENCOUNTER — Ambulatory Visit: Payer: Commercial Managed Care - HMO | Admitting: Pharmacist

## 2017-01-07 VITALS — BP 142/90

## 2017-01-07 DIAGNOSIS — Z79899 Other long term (current) drug therapy: Secondary | ICD-10-CM

## 2017-01-11 NOTE — Progress Notes (Signed)
Mr. Tietje stopped by our office and asked to have his blood pressure checked. Patient states that it has been "high" and that he was started on new medication. He has not received medications from [facility] since 2016.  He has an eligibility appointment with our care manager scheduled for 01-14-17.   Per the hospital discharge note from 12/31/16, patient has not been compliant and was started on lisinopril, metoprolol tartrate, and amlodipine for his hypertension.  BP was 152/92 then 142/90. Patient denied chest pain, headaches or dizziness.  Shreyas Piatkowski K. Dicky Doe, PharmD [facility] [place] Operations Coordinator [phone]

## 2017-01-14 ENCOUNTER — Ambulatory Visit: Payer: Commercial Managed Care - HMO | Admitting: Pharmacy Technician

## 2017-01-17 NOTE — Progress Notes (Signed)
Patient scheduled for eligibility appointment at [facility].  Patient did not show for the appointment on Jan 14, 2018 at 9:00a.m.  Patient did not reschedule eligibility appointment.  [facility] unable to provide additional medication assistance until eligibility is determined.  [facility]

## 2017-05-04 ENCOUNTER — Ambulatory Visit: Payer: Self-pay | Admitting: Pharmacy Technician

## 2017-05-04 ENCOUNTER — Encounter (INDEPENDENT_AMBULATORY_CARE_PROVIDER_SITE_OTHER): Payer: Self-pay

## 2017-05-04 DIAGNOSIS — Z79899 Other long term (current) drug therapy: Secondary | ICD-10-CM

## 2017-05-04 NOTE — Progress Notes (Signed)
Met with patient completed financial assistance application for  due to recent hospital visit.  Patient agreed to be responsible for gathering financial information and forwarding to appropriate department in [facility].    Completed [facility] application and contract.  Patient agreed to all terms of the [facility] contract.  Patient approved to receive medication assistance through 2018, as long as eligibility criteria continues to be met.  Provided patient with Civil engineer, contracting based on his particular needs.    Referred patient to [facility].  Referred patient for MTM.  [place]

## 2017-05-24 ENCOUNTER — Emergency Department
Admission: EM | Admit: 2017-05-24 | Discharge: 2017-05-24 | Disposition: A | Discharge status: Home / Self Care | Payer: Self-pay | Attending: Emergency Medicine | Admitting: Emergency Medicine

## 2017-05-24 ENCOUNTER — Encounter: Payer: Self-pay | Admitting: *Deleted

## 2017-05-24 ENCOUNTER — Emergency Department: Payer: Self-pay

## 2017-05-24 DIAGNOSIS — I10 Essential (primary) hypertension: Secondary | ICD-10-CM | POA: Insufficient documentation

## 2017-05-24 DIAGNOSIS — W19XXXA Unspecified fall, initial encounter: Secondary | ICD-10-CM | POA: Insufficient documentation

## 2017-05-24 DIAGNOSIS — E119 Type 2 diabetes mellitus without complications: Secondary | ICD-10-CM | POA: Insufficient documentation

## 2017-05-24 DIAGNOSIS — R0789 Other chest pain: Secondary | ICD-10-CM | POA: Insufficient documentation

## 2017-05-24 DIAGNOSIS — Z7982 Long term (current) use of aspirin: Secondary | ICD-10-CM | POA: Insufficient documentation

## 2017-05-24 DIAGNOSIS — M25562 Pain in left knee: Secondary | ICD-10-CM | POA: Insufficient documentation

## 2017-05-24 DIAGNOSIS — Z7984 Long term (current) use of oral hypoglycemic drugs: Secondary | ICD-10-CM | POA: Insufficient documentation

## 2017-05-24 LAB — CBC
HEMATOCRIT: 34 % — AB (ref 40.0–52.0)
Hemoglobin: 11.7 g/dL — ABNORMAL LOW (ref 13.0–18.0)
MCH: 28.3 pg (ref 26.0–34.0)
MCHC: 34.4 g/dL (ref 32.0–36.0)
MCV: 82.2 fL (ref 80.0–100.0)
PLATELETS: 212 10*3/uL (ref 150–440)
RBC: 4.13 MIL/uL — ABNORMAL LOW (ref 4.40–5.90)
RDW: 14.6 % — AB (ref 11.5–14.5)
WBC: 8.3 10*3/uL (ref 3.8–10.6)

## 2017-05-24 LAB — BASIC METABOLIC PANEL
Anion gap: 8 (ref 5–15)
BUN: 23 mg/dL — AB (ref 6–20)
CO2: 27 mmol/L (ref 22–32)
CREATININE: 1.78 mg/dL — AB (ref 0.61–1.24)
Calcium: 9.3 mg/dL (ref 8.9–10.3)
Chloride: 103 mmol/L (ref 101–111)
GFR calc Af Amer: 47 mL/min — ABNORMAL LOW (ref >60)
GFR, EST NON AFRICAN AMERICAN: 41 mL/min — AB (ref >60)
Glucose, Bld: 140 mg/dL — ABNORMAL HIGH (ref 65–99)
POTASSIUM: 3.7 mmol/L (ref 3.5–5.1)
SODIUM: 138 mmol/L (ref 135–145)

## 2017-05-24 LAB — TROPONIN I
TROPONIN I: 0.04 ng/mL — AB (ref <0.03)
Troponin I: 0.04 ng/mL (ref <0.03)

## 2017-05-24 IMAGING — CR DG KNEE COMPLETE 4+V*L*
4 series · 4 of 4 positions shown · non-contrast
Comparison: CT scan [DATE]

CLINICAL DATA: Fell yesterday.  Low back pain.

EXAM:
LEFT KNEE - COMPLETE 4+ VIEW; LUMBAR SPINE - 2-3 VIEW

[knee ap]
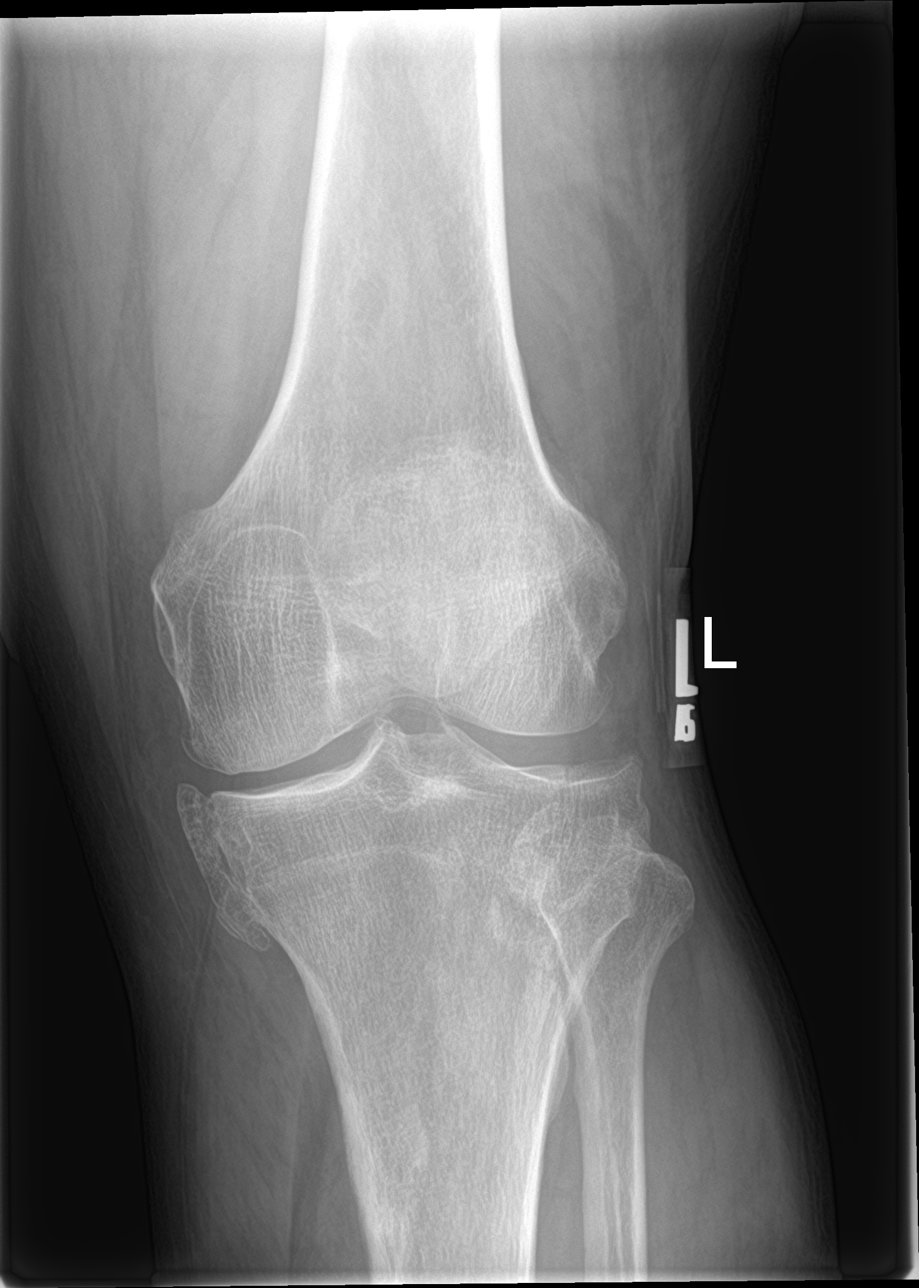

[knee obl (1 of 2)]
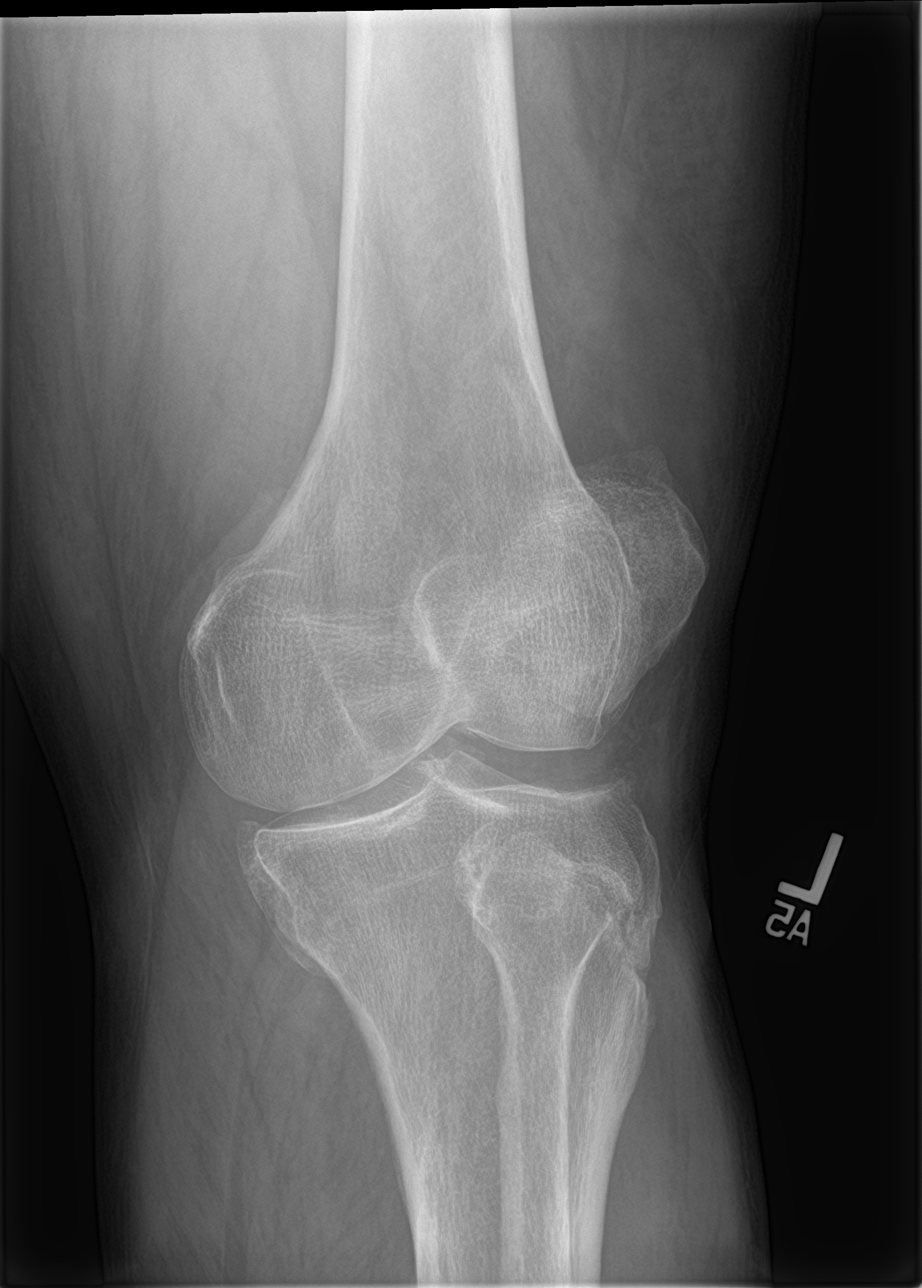

[knee obl (2 of 2)]
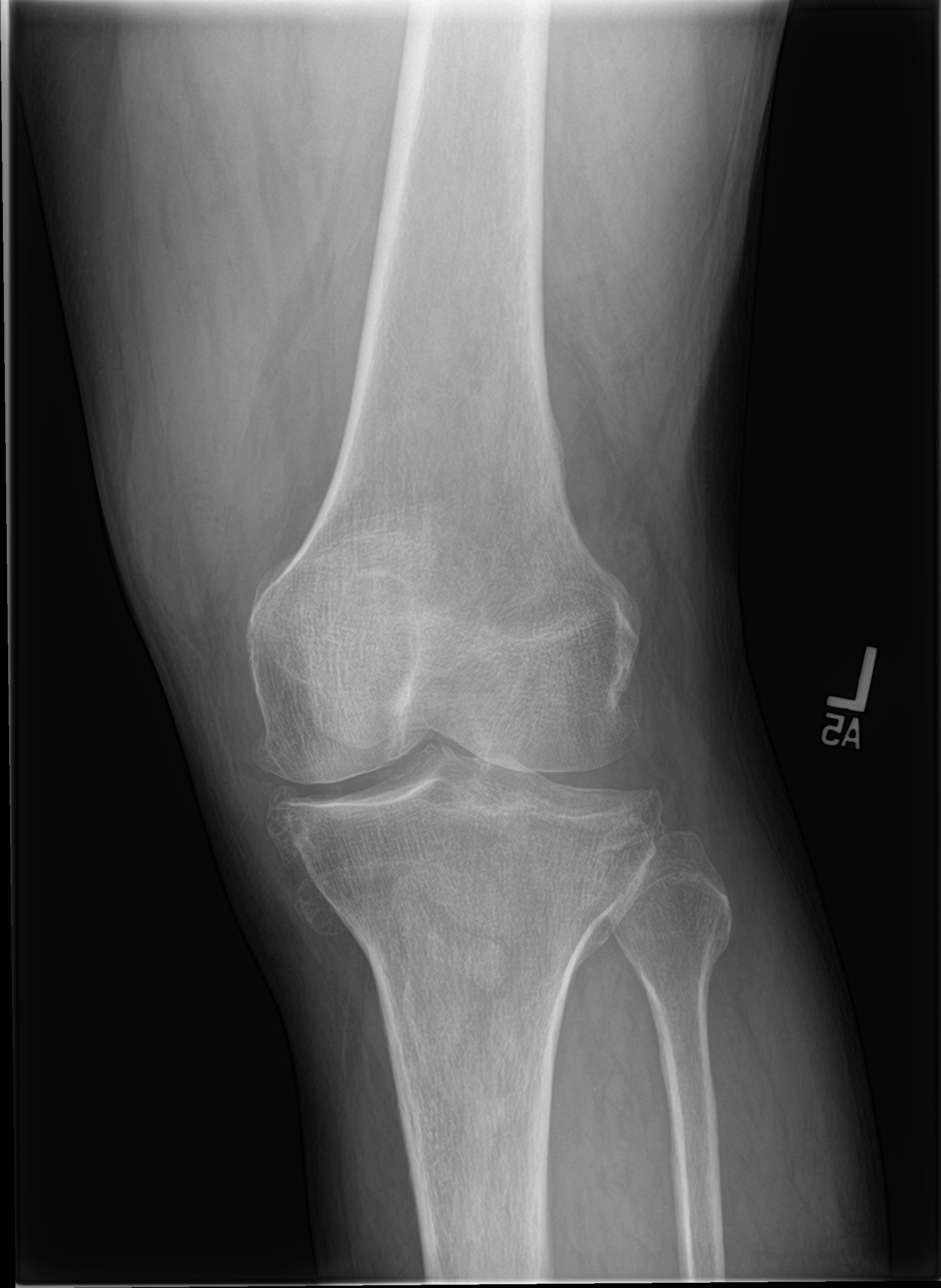

[knee lat]
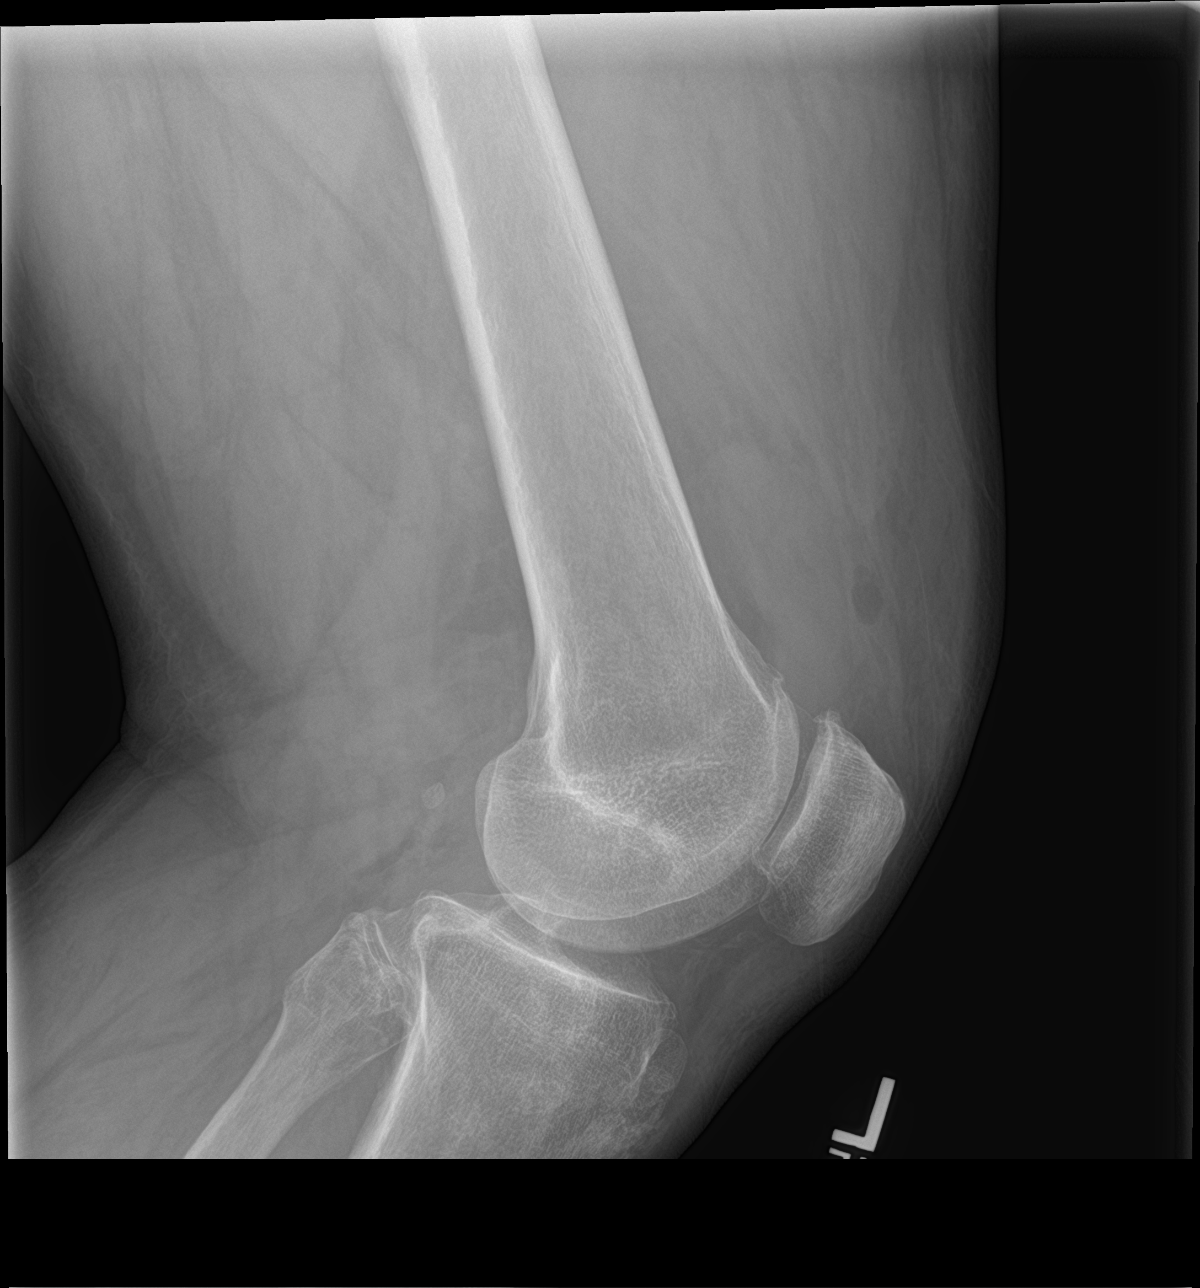

[4 of 4 positions shown; findings below may reference images not displayed]

FINDINGS: Stable degenerative changes. Normal overall alignment. No acute
fracture is identified. The facets are normally aligned. Advanced
facet disease in the lower lumbar spine most notably at L5-S1. No
pars defects.
IMPRESSION: Normal alignment and no acute bony findings.

Stable degenerative changes.

## 2017-05-24 IMAGING — CR DG LUMBAR SPINE 2-3V
3 series · 3 of 3 positions shown · non-contrast
Comparison: CT scan [DATE]

CLINICAL DATA: Fell yesterday.  Low back pain.

EXAM:
LEFT KNEE - COMPLETE 4+ VIEW; LUMBAR SPINE - 2-3 VIEW

[l-spine ap]
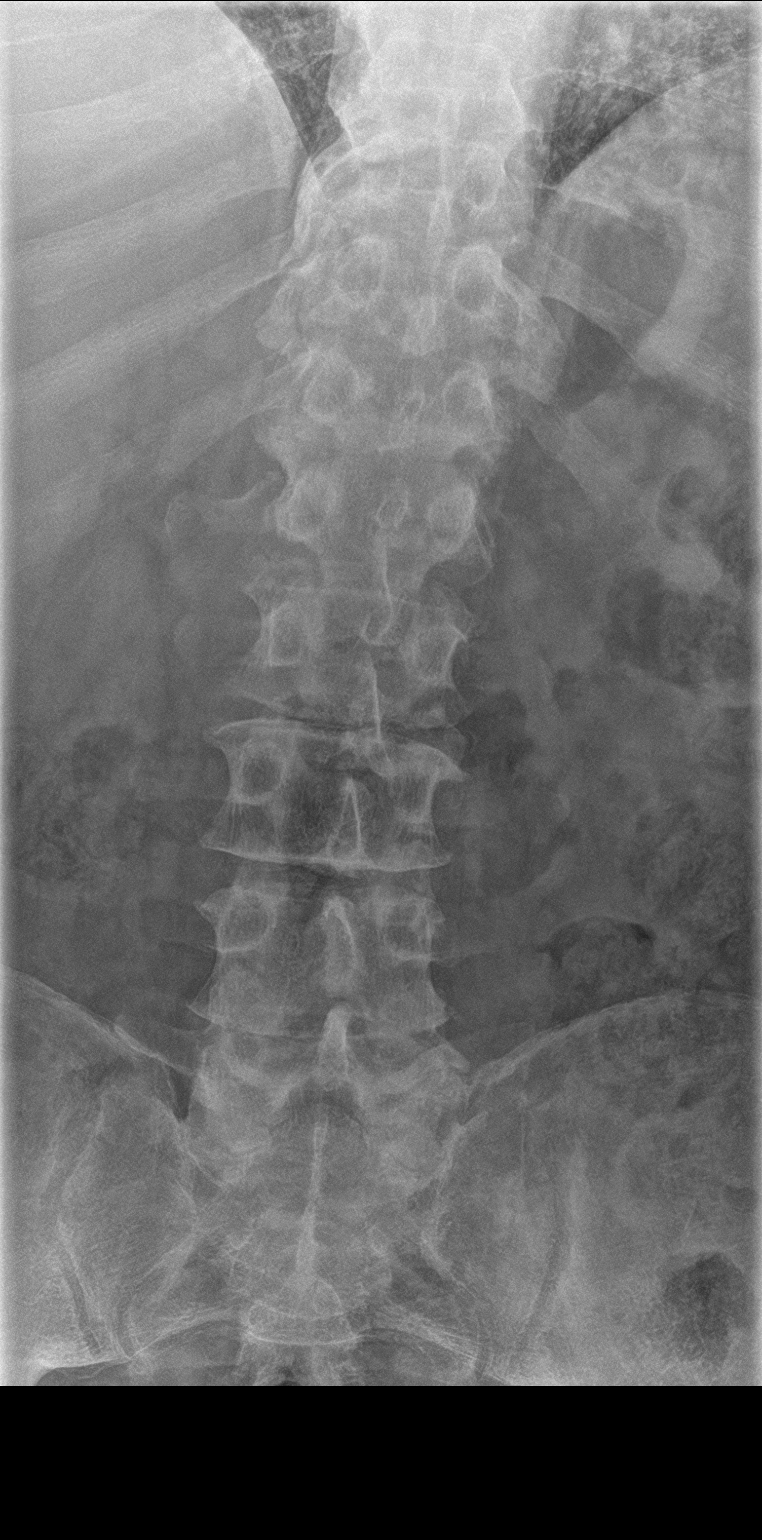

[l-spine lat]
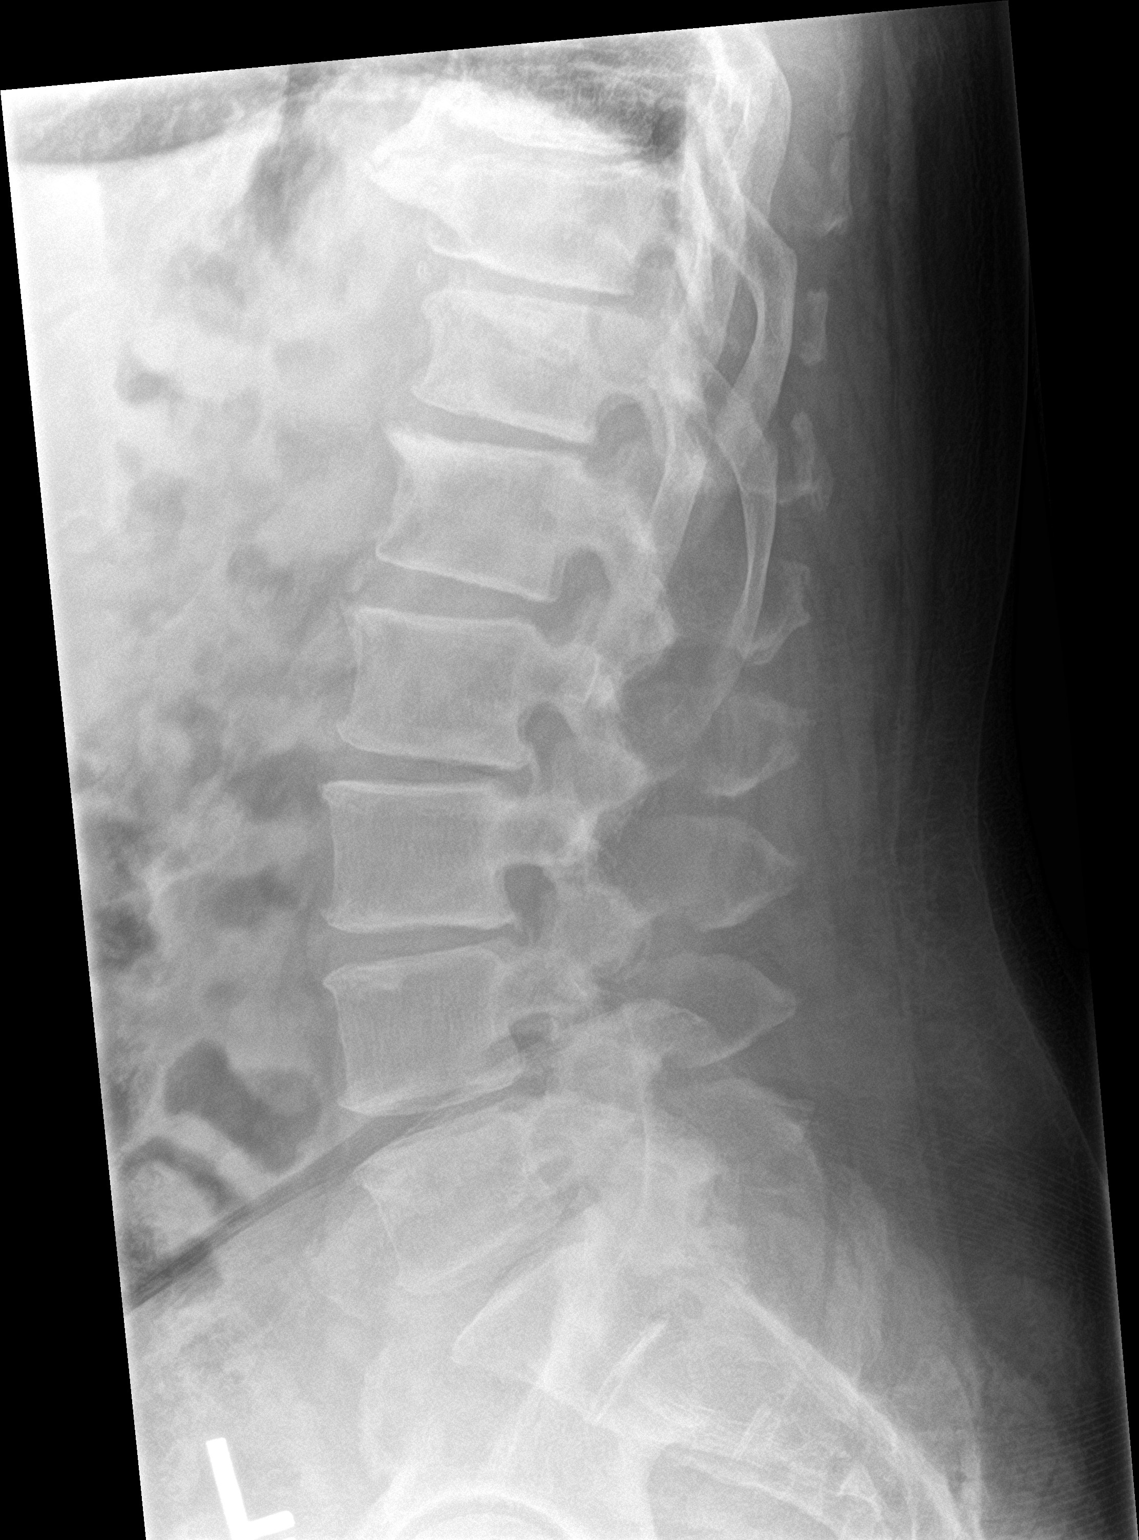

[l-spine spot]
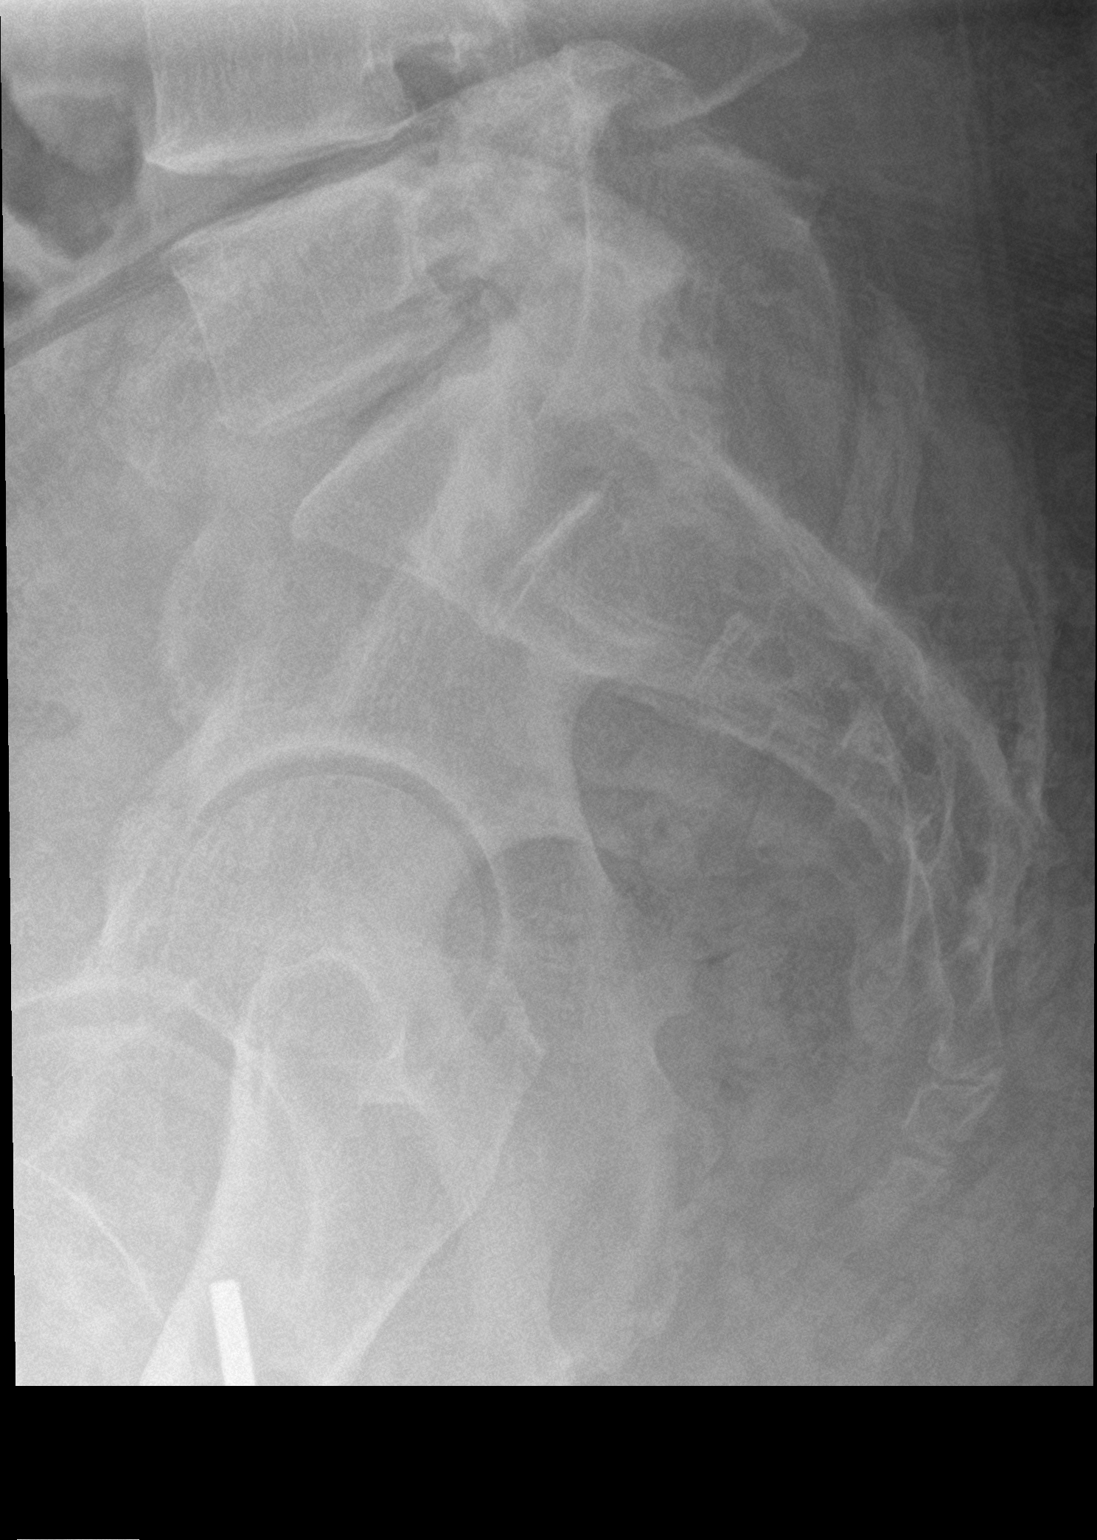

[3 of 3 positions shown; findings below may reference images not displayed]

FINDINGS: Stable degenerative changes. Normal overall alignment. No acute
fracture is identified. The facets are normally aligned. Advanced
facet disease in the lower lumbar spine most notably at L5-S1. No
pars defects.
IMPRESSION: Normal alignment and no acute bony findings.

Stable degenerative changes.

## 2017-05-24 IMAGING — CR DG CHEST 2V
2 series · 2 of 2 positions shown · non-contrast
Comparison: [DATE]

CLINICAL DATA: Shortness of Breath

EXAM:
CHEST  2 VIEW

[chest lat]
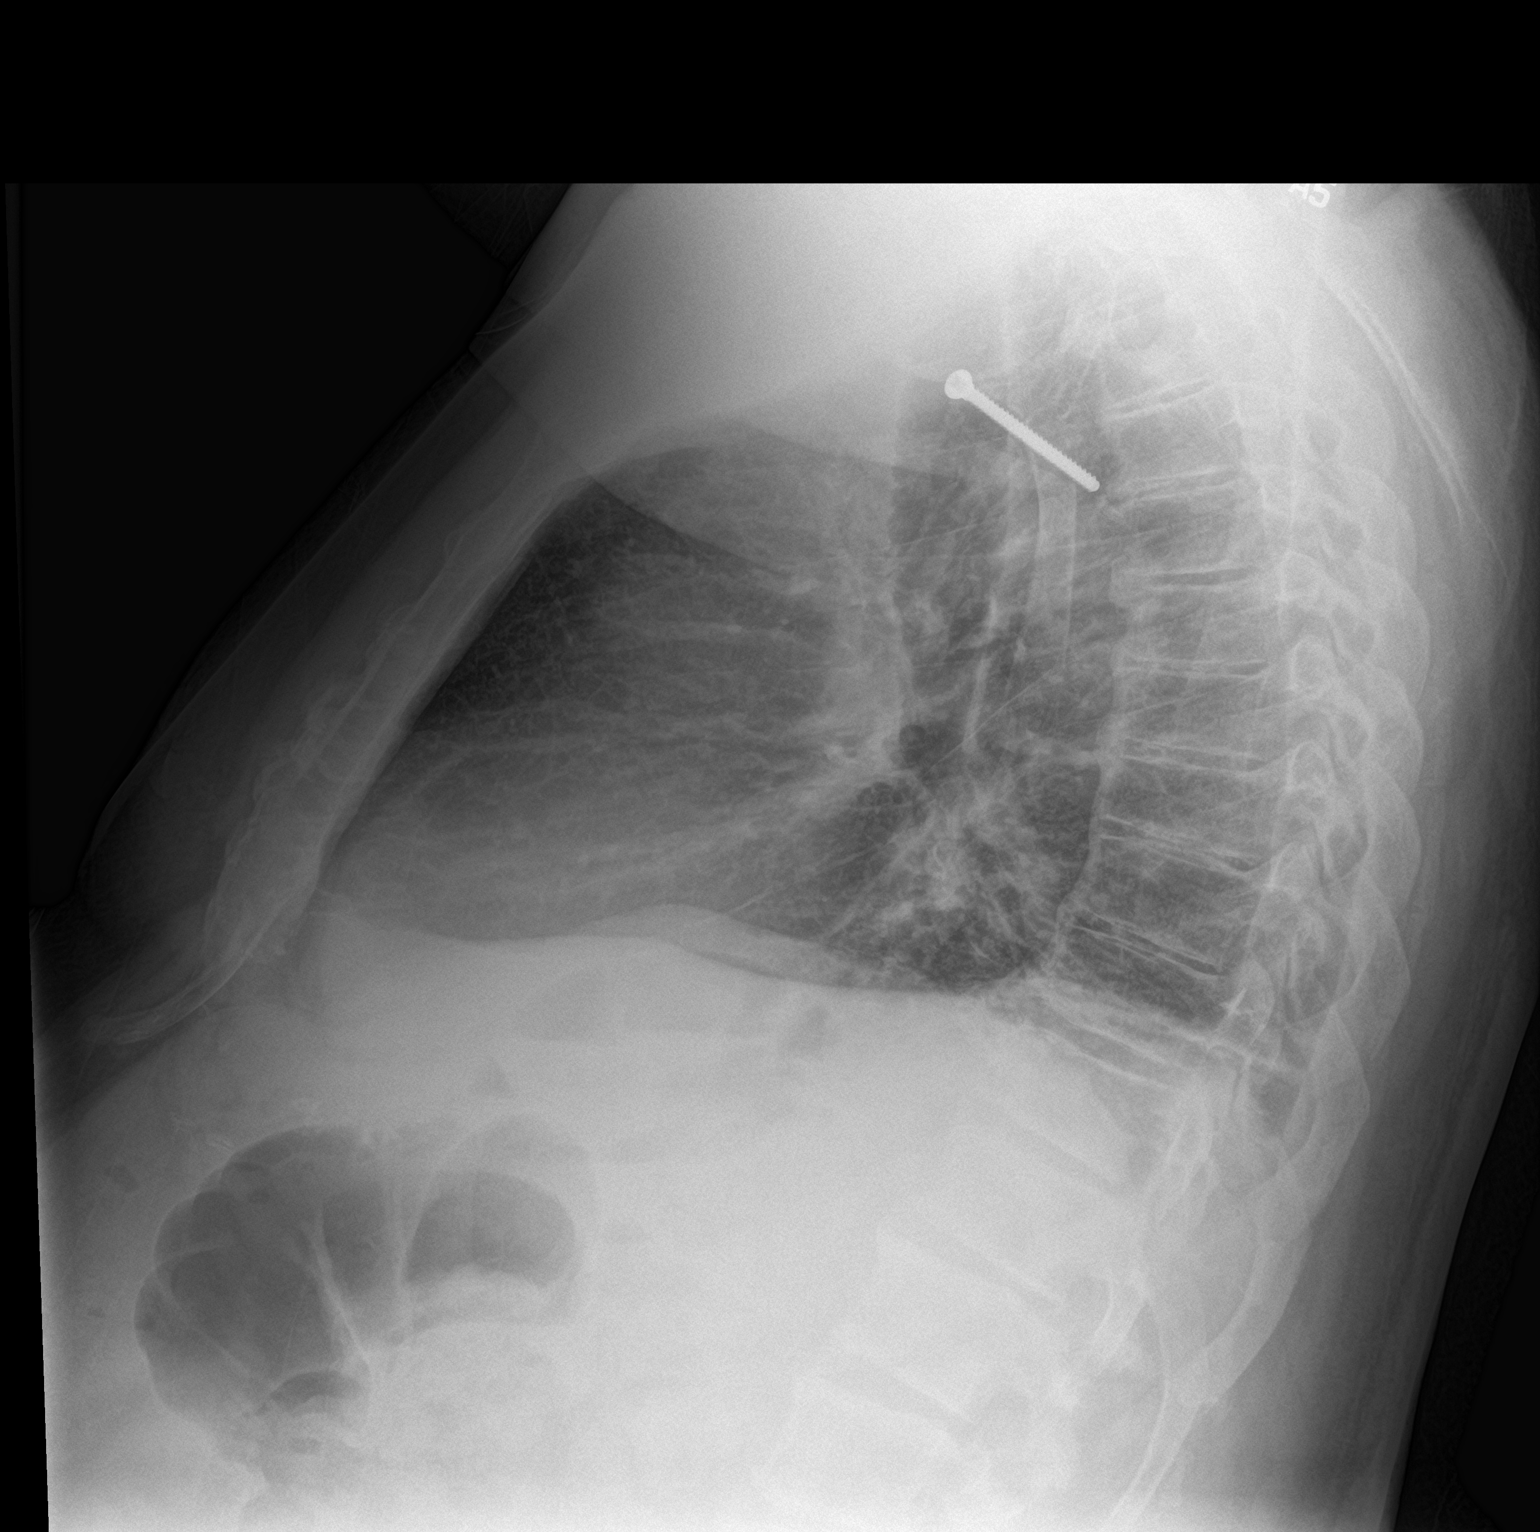

[chest ap]
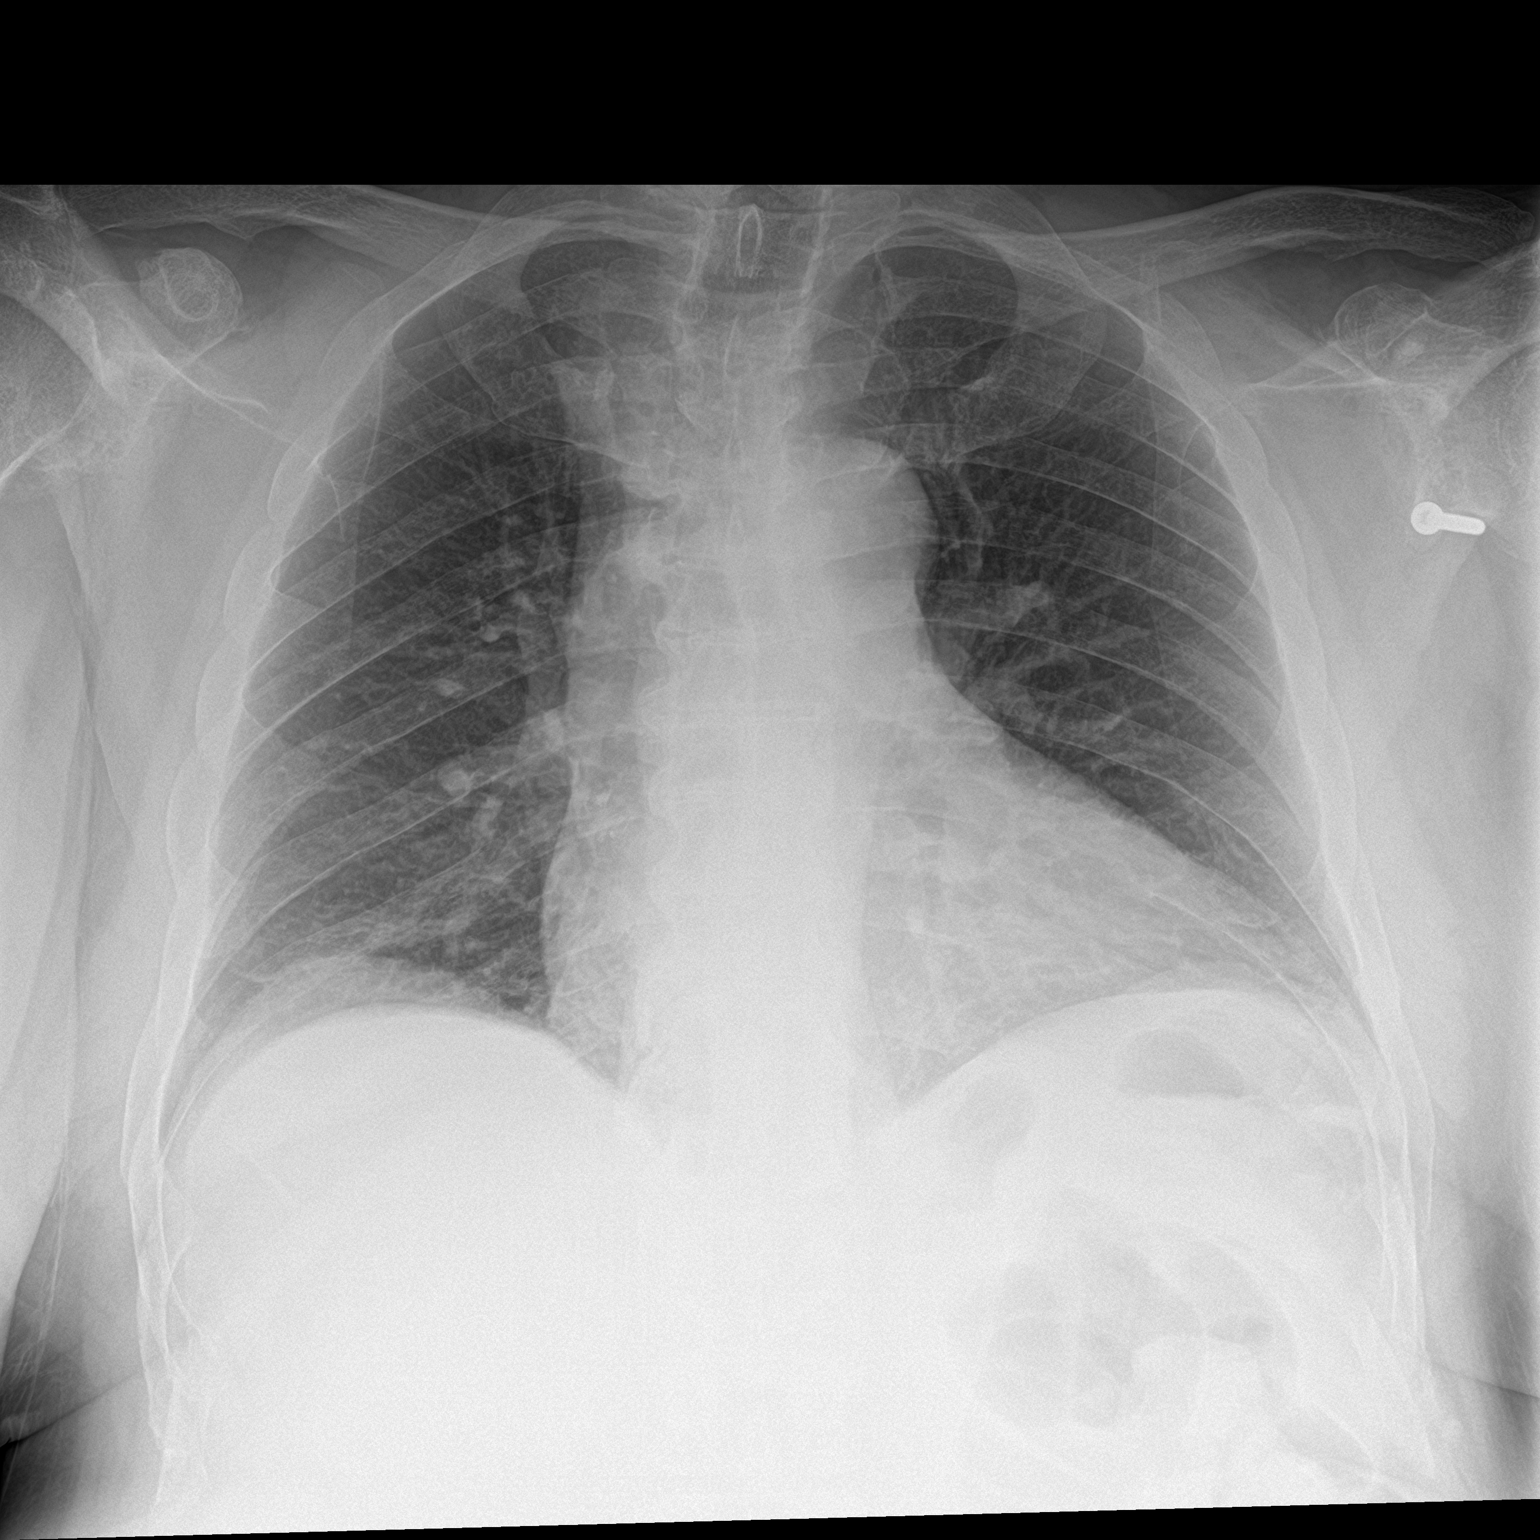

[2 of 2 positions shown; findings below may reference images not displayed]

FINDINGS: Bibasilar atelectasis. Mild cardiomegaly. No overt edema or
effusions. No acute bony abnormality.
IMPRESSION: Cardiomegaly.  Bibasilar atelectasis.

## 2017-05-24 MED ORDER — LORAZEPAM 1 MG PO TABS: 1.0000 mg | ORAL_TABLET | Freq: Two times a day (BID) | ORAL | 0 refills | Status: AC

## 2017-05-24 MED ORDER — HYDROCODONE-ACETAMINOPHEN 5-325 MG PO TABS: 1.0000 | ORAL_TABLET | Freq: Four times a day (QID) | ORAL | 0 refills | Status: DC | PRN

## 2017-05-24 MED ORDER — HYDROCODONE-ACETAMINOPHEN 5-325 MG PO TABS
2.0000 | ORAL_TABLET | Freq: Once | ORAL | Status: AC
Start: 2017-05-24 — End: 2017-05-24
  Administered 2017-05-24: 2 via ORAL
  Filled 2017-05-24: qty 2

## 2017-05-24 NOTE — ED Notes (Signed)
Troponin 0.04 Retail banker notified. Awaiting bed placement.
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
## 2017-05-24 NOTE — ED Triage Notes (Signed)
Pt arrives with complaints of chest tightness and feeling as if he is breathing threw a straw, states he also fell last night and and has back pain and left knee pain, uses a cane

## 2017-06-01 ENCOUNTER — Emergency Department
Admission: EM | Admit: 2017-06-01 | Discharge: 2017-06-01 | Disposition: A | Discharge status: Home / Self Care | Payer: Self-pay | Attending: Emergency Medicine | Admitting: Emergency Medicine

## 2017-06-01 ENCOUNTER — Encounter: Payer: Self-pay | Admitting: Emergency Medicine

## 2017-06-01 DIAGNOSIS — E119 Type 2 diabetes mellitus without complications: Secondary | ICD-10-CM | POA: Insufficient documentation

## 2017-06-01 DIAGNOSIS — I10 Essential (primary) hypertension: Secondary | ICD-10-CM | POA: Insufficient documentation

## 2017-06-01 DIAGNOSIS — Z7984 Long term (current) use of oral hypoglycemic drugs: Secondary | ICD-10-CM | POA: Insufficient documentation

## 2017-06-01 DIAGNOSIS — M25562 Pain in left knee: Secondary | ICD-10-CM | POA: Insufficient documentation

## 2017-06-01 DIAGNOSIS — Z7982 Long term (current) use of aspirin: Secondary | ICD-10-CM | POA: Insufficient documentation

## 2017-06-01 DIAGNOSIS — M1712 Unilateral primary osteoarthritis, left knee: Secondary | ICD-10-CM | POA: Insufficient documentation

## 2017-06-01 DIAGNOSIS — Z79899 Other long term (current) drug therapy: Secondary | ICD-10-CM | POA: Insufficient documentation

## 2017-06-01 MED ORDER — HYDROCODONE-ACETAMINOPHEN 5-325 MG PO TABS: 1.0000 | ORAL_TABLET | Freq: Four times a day (QID) | ORAL | 0 refills | Status: DC | PRN
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
## 2017-06-01 NOTE — Discharge Instructions (Signed)
Follow-up with your primary care doctor for reevaluation of your blood pressure. Blood pressure today was 176/91. Wear knee immobilizer when up walking. Call and make an appointment with Dr. Marry Guan who is the orthopedist on call today for reevaluation of your knee pain.

## 2017-06-01 NOTE — ED Triage Notes (Signed)
Says the vicodin was working on his knee pain, but he is out

## 2017-06-01 NOTE — ED Triage Notes (Signed)
Pt says his knee is still hurting from when he came in last time.  Also says he is still short of breath as he was then. He is not worse. He has not followed up as yet.

## 2017-07-16 ENCOUNTER — Emergency Department: Payer: Self-pay

## 2017-07-16 ENCOUNTER — Emergency Department
Admission: EM | Admit: 2017-07-16 | Discharge: 2017-07-16 | Disposition: A | Discharge status: Home / Self Care | Payer: Self-pay | Attending: Emergency Medicine | Admitting: Emergency Medicine

## 2017-07-16 ENCOUNTER — Encounter: Payer: Self-pay | Admitting: Emergency Medicine

## 2017-07-16 ENCOUNTER — Other Ambulatory Visit: Payer: Self-pay

## 2017-07-16 DIAGNOSIS — Z7984 Long term (current) use of oral hypoglycemic drugs: Secondary | ICD-10-CM | POA: Insufficient documentation

## 2017-07-16 DIAGNOSIS — I10 Essential (primary) hypertension: Secondary | ICD-10-CM | POA: Insufficient documentation

## 2017-07-16 DIAGNOSIS — E119 Type 2 diabetes mellitus without complications: Secondary | ICD-10-CM | POA: Insufficient documentation

## 2017-07-16 DIAGNOSIS — Z7982 Long term (current) use of aspirin: Secondary | ICD-10-CM | POA: Insufficient documentation

## 2017-07-16 DIAGNOSIS — Z79899 Other long term (current) drug therapy: Secondary | ICD-10-CM | POA: Insufficient documentation

## 2017-07-16 DIAGNOSIS — R079 Chest pain, unspecified: Secondary | ICD-10-CM | POA: Insufficient documentation

## 2017-07-16 LAB — BASIC METABOLIC PANEL
ANION GAP: 8 (ref 5–15)
BUN: 21 mg/dL — ABNORMAL HIGH (ref 6–20)
CALCIUM: 9.8 mg/dL (ref 8.9–10.3)
CO2: 27 mmol/L (ref 22–32)
Chloride: 101 mmol/L (ref 101–111)
Creatinine, Ser: 1.65 mg/dL — ABNORMAL HIGH (ref 0.61–1.24)
GFR, EST AFRICAN AMERICAN: 51 mL/min — AB (ref >60)
GFR, EST NON AFRICAN AMERICAN: 44 mL/min — AB (ref >60)
Glucose, Bld: 173 mg/dL — ABNORMAL HIGH (ref 65–99)
Potassium: 3.6 mmol/L (ref 3.5–5.1)
Sodium: 136 mmol/L (ref 135–145)

## 2017-07-16 LAB — CBC
HCT: 36.5 % — ABNORMAL LOW (ref 40.0–52.0)
HEMOGLOBIN: 12.1 g/dL — AB (ref 13.0–18.0)
MCH: 27.1 pg (ref 26.0–34.0)
MCHC: 33.2 g/dL (ref 32.0–36.0)
MCV: 81.4 fL (ref 80.0–100.0)
Platelets: 240 10*3/uL (ref 150–440)
RBC: 4.48 MIL/uL (ref 4.40–5.90)
RDW: 14.5 % (ref 11.5–14.5)
WBC: 6.3 10*3/uL (ref 3.8–10.6)

## 2017-07-16 LAB — TROPONIN I
TROPONIN I: 0.04 ng/mL — AB (ref <0.03)
TROPONIN I: 0.04 ng/mL — AB (ref <0.03)

## 2017-07-16 IMAGING — CR DG CHEST 2V
2 series · 2 of 2 positions shown · non-contrast
Comparison: [DATE]

CLINICAL DATA: Intermittent central chest pain.

EXAM:
CHEST  2 VIEW

[chest pa]
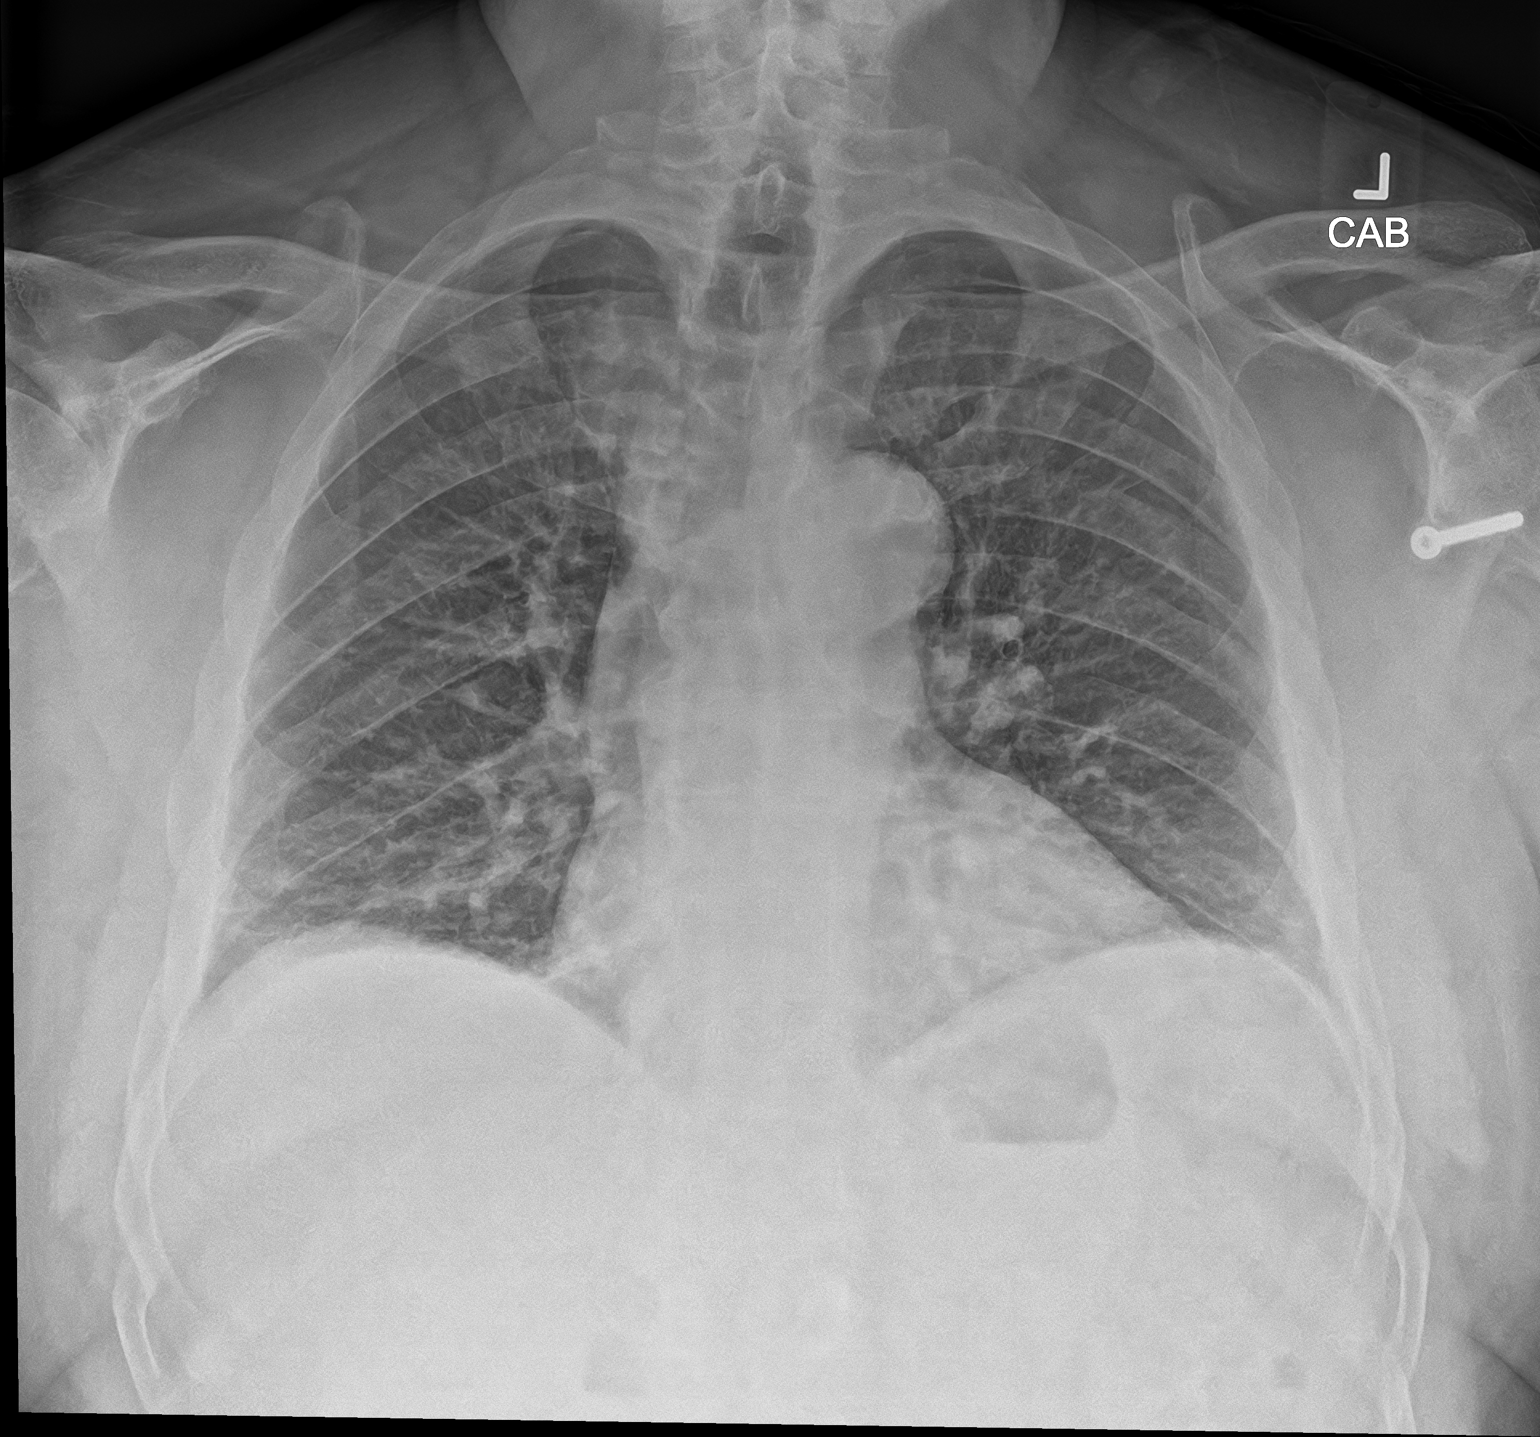

[chest lat]
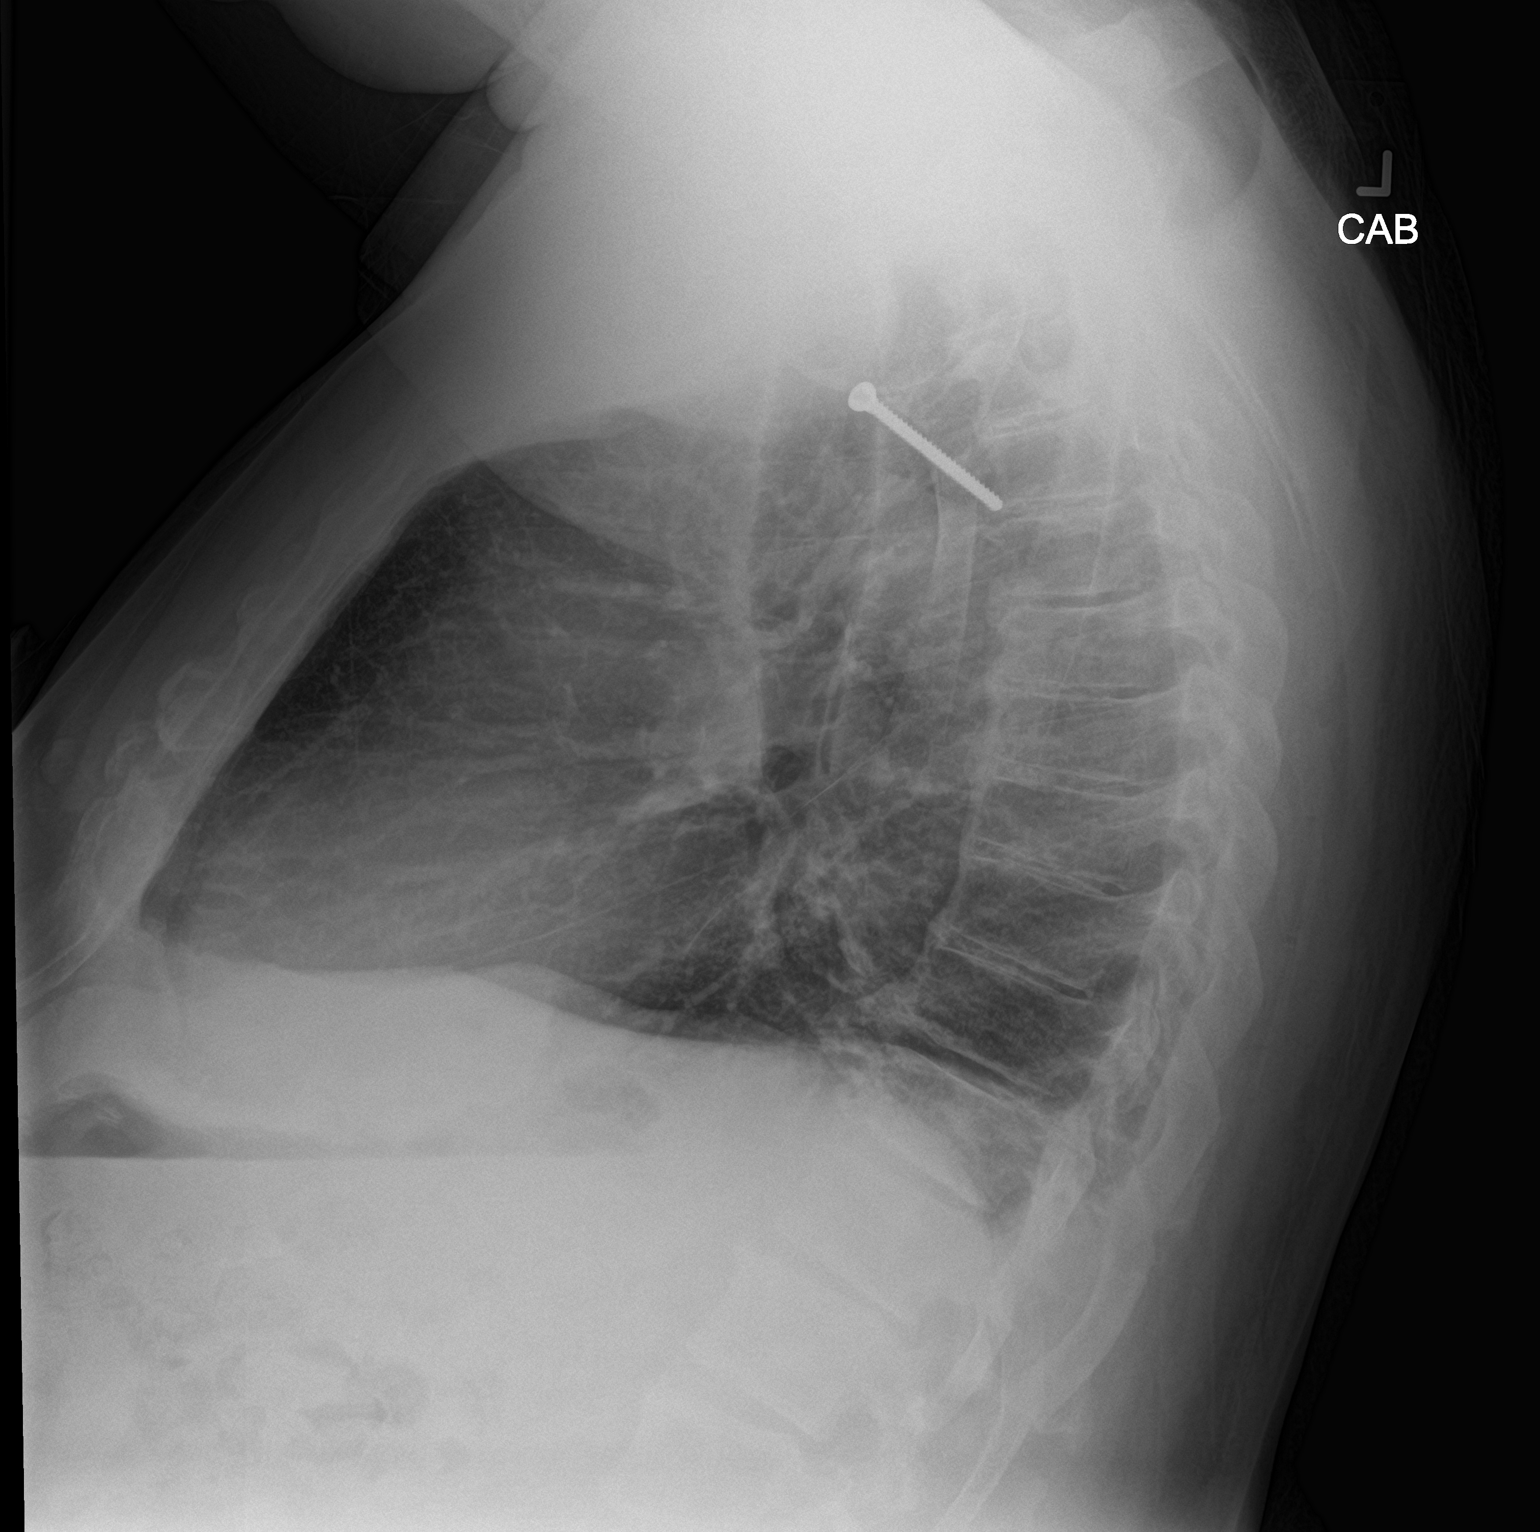

[2 of 2 positions shown; findings below may reference images not displayed]

FINDINGS: Cardiomediastinal silhouette is normal. Mediastinal contours appear
intact. Tortuosity and calcific atherosclerotic disease of the
aorta.

There is no evidence of focal airspace consolidation, pleural
effusion or pneumothorax. Diffusely increased interstitial markings.

Osseous structures are without acute abnormality. Soft tissues are
grossly normal.
IMPRESSION: Diffusely increased interstitial markings which may represent
pulmonary vascular congestion.

Calcific atherosclerotic disease of the aorta.

## 2017-07-16 NOTE — ED Triage Notes (Signed)
Pt to ED stating that he has been having intermittent chest pain since Wednesday. Pt states that he does have shortness of breath associated with the chest pain but no other symptoms. Pt states that he thinks some of his medications may be too strong. Pt is in NAD at this time.

## 2017-07-16 NOTE — ED Notes (Signed)
Pt resting in bed watching football at this time. NAD Noted. VSS. Pt denies any needs. Will continue to monitor for further patient needs. Explained to patient will continue to monitor until approx 1500 when 2nd cardiac enzyme will be drawn, pt states understanding at this time.

## 2017-07-16 NOTE — ED Notes (Signed)
Pt currently states no chest pain at present

## 2017-07-16 NOTE — Discharge Instructions (Signed)
You have been seen in the emergency department today for chest pain. Your workup has shown normal results. As we discussed please follow-up with your primary care physician in the next 1-2 days for recheck. Return to the emergency department for any further chest pain, trouble breathing, or any other symptom personally concerning to yourself. °
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
## 2017-07-16 NOTE — ED Notes (Signed)
This RN and MD to bedside.

## 2017-12-09 ENCOUNTER — Telehealth: Payer: Self-pay | Admitting: Pharmacy Technician

## 2017-12-09 NOTE — Telephone Encounter (Signed)
Patient failed to provide 2019 financial documentation.  No additional medication assistance will be provided by [facility] without the required proof of income documentation.  Patient notified by letter.  Brixton Schnapp J. Kymberlyn Eckford Care Manager [facility] 

## 2017-12-16 ENCOUNTER — Ambulatory Visit: Payer: Self-pay | Admitting: Pharmacy Technician

## 2017-12-19 ENCOUNTER — Ambulatory Visit: Payer: Self-pay | Admitting: Pharmacist

## 2017-12-19 ENCOUNTER — Encounter: Payer: Self-pay | Admitting: Pharmacist

## 2017-12-19 ENCOUNTER — Other Ambulatory Visit: Payer: Self-pay

## 2017-12-19 ENCOUNTER — Telehealth: Payer: Self-pay | Admitting: Pharmacy Technician

## 2017-12-19 VITALS — Ht 71.0 in | Wt 284.0 lb

## 2017-12-19 DIAGNOSIS — Z79899 Other long term (current) drug therapy: Secondary | ICD-10-CM
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
## 2017-12-19 NOTE — Telephone Encounter (Signed)
Received updated proof of income.  Patient eligible to receive medication assistance at [facility] through 2019, as long as eligibility requirements continue to be met.  [place] [facility]

## 2018-03-28 ENCOUNTER — Encounter (INDEPENDENT_AMBULATORY_CARE_PROVIDER_SITE_OTHER): Payer: Self-pay

## 2018-03-28 ENCOUNTER — Other Ambulatory Visit: Payer: Self-pay

## 2018-03-28 ENCOUNTER — Telehealth: Payer: Self-pay | Admitting: Pharmacy Technician

## 2018-03-28 ENCOUNTER — Ambulatory Visit: Payer: Medicaid Other | Admitting: Pharmacist

## 2018-03-28 DIAGNOSIS — Z79899 Other long term (current) drug therapy: Secondary | ICD-10-CM

## 2018-03-28 NOTE — Telephone Encounter (Signed)
Patient has full Medicaid that provides prescription drug coverage.  Patient is no longer eligible to receive medication assistance at [facility].  Patient told when he presented to pick-up medications at [facility].  Patient acknowledged that he understood.  Patient asked that his prescriptions be transferred to [facility], [employer], [place].    Rita-Pharmacy Technician at [facility] is to handle the transfer of the medications to [place].  [place] [facility]
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
## 2018-03-30 ENCOUNTER — Encounter: Payer: Self-pay | Admitting: Pharmacist

## 2018-07-19 ENCOUNTER — Other Ambulatory Visit: Payer: Self-pay | Admitting: Student

## 2018-07-19 DIAGNOSIS — B182 Chronic viral hepatitis C: Secondary | ICD-10-CM

## 2018-07-27 ENCOUNTER — Ambulatory Visit: Payer: Medicaid Other

## 2019-03-07 DIAGNOSIS — E161 Other hypoglycemia: Secondary | ICD-10-CM

## 2019-03-07 HISTORY — DX: Other hypoglycemia: E16.1

## 2019-03-29 ENCOUNTER — Inpatient Hospital Stay: Admit: 2019-03-29 | Payer: Medicare Other

## 2019-03-29 ENCOUNTER — Inpatient Hospital Stay
Admission: EM | Admit: 2019-03-29 | Discharge: 2019-04-06 | DRG: 291 | Disposition: A | Discharge status: Home Health | Payer: Medicare Other | Attending: Specialist | Admitting: Specialist

## 2019-03-29 ENCOUNTER — Emergency Department: Payer: Medicare Other

## 2019-03-29 ENCOUNTER — Encounter: Payer: Self-pay | Admitting: Emergency Medicine

## 2019-03-29 ENCOUNTER — Other Ambulatory Visit: Payer: Self-pay

## 2019-03-29 ENCOUNTER — Inpatient Hospital Stay (HOSPITAL_COMMUNITY)
Admit: 2019-03-29 | Discharge: 2019-03-29 | Disposition: A | Discharge status: Home / Self Care | Payer: Medicare Other | Attending: Critical Care Medicine | Admitting: Critical Care Medicine

## 2019-03-29 ENCOUNTER — Inpatient Hospital Stay: Payer: Medicare Other

## 2019-03-29 DIAGNOSIS — N2581 Secondary hyperparathyroidism of renal origin: Secondary | ICD-10-CM | POA: Diagnosis present

## 2019-03-29 DIAGNOSIS — F419 Anxiety disorder, unspecified: Secondary | ICD-10-CM | POA: Diagnosis present

## 2019-03-29 DIAGNOSIS — M549 Dorsalgia, unspecified: Secondary | ICD-10-CM | POA: Diagnosis not present

## 2019-03-29 DIAGNOSIS — Z79899 Other long term (current) drug therapy: Secondary | ICD-10-CM | POA: Diagnosis not present

## 2019-03-29 DIAGNOSIS — I340 Nonrheumatic mitral (valve) insufficiency: Secondary | ICD-10-CM

## 2019-03-29 DIAGNOSIS — Z7984 Long term (current) use of oral hypoglycemic drugs: Secondary | ICD-10-CM

## 2019-03-29 DIAGNOSIS — I132 Hypertensive heart and chronic kidney disease with heart failure and with stage 5 chronic kidney disease, or end stage renal disease: Secondary | ICD-10-CM | POA: Diagnosis present

## 2019-03-29 DIAGNOSIS — G8929 Other chronic pain: Secondary | ICD-10-CM | POA: Diagnosis present

## 2019-03-29 DIAGNOSIS — N189 Chronic kidney disease, unspecified: Secondary | ICD-10-CM

## 2019-03-29 DIAGNOSIS — Z7982 Long term (current) use of aspirin: Secondary | ICD-10-CM | POA: Diagnosis not present

## 2019-03-29 DIAGNOSIS — N185 Chronic kidney disease, stage 5: Secondary | ICD-10-CM

## 2019-03-29 DIAGNOSIS — I5033 Acute on chronic diastolic (congestive) heart failure: Secondary | ICD-10-CM | POA: Diagnosis present

## 2019-03-29 DIAGNOSIS — N183 Chronic kidney disease, stage 3 (moderate): Secondary | ICD-10-CM

## 2019-03-29 DIAGNOSIS — N186 End stage renal disease: Secondary | ICD-10-CM | POA: Diagnosis present

## 2019-03-29 DIAGNOSIS — K219 Gastro-esophageal reflux disease without esophagitis: Secondary | ICD-10-CM | POA: Diagnosis present

## 2019-03-29 DIAGNOSIS — Z8249 Family history of ischemic heart disease and other diseases of the circulatory system: Secondary | ICD-10-CM | POA: Diagnosis not present

## 2019-03-29 DIAGNOSIS — F329 Major depressive disorder, single episode, unspecified: Secondary | ICD-10-CM | POA: Diagnosis present

## 2019-03-29 DIAGNOSIS — D631 Anemia in chronic kidney disease: Secondary | ICD-10-CM | POA: Diagnosis present

## 2019-03-29 DIAGNOSIS — Z9114 Patient's other noncompliance with medication regimen: Secondary | ICD-10-CM | POA: Diagnosis not present

## 2019-03-29 DIAGNOSIS — D649 Anemia, unspecified: Secondary | ICD-10-CM

## 2019-03-29 DIAGNOSIS — Z20828 Contact with and (suspected) exposure to other viral communicable diseases: Secondary | ICD-10-CM | POA: Diagnosis present

## 2019-03-29 DIAGNOSIS — E1122 Type 2 diabetes mellitus with diabetic chronic kidney disease: Secondary | ICD-10-CM | POA: Diagnosis present

## 2019-03-29 DIAGNOSIS — J810 Acute pulmonary edema: Secondary | ICD-10-CM

## 2019-03-29 DIAGNOSIS — Z885 Allergy status to narcotic agent status: Secondary | ICD-10-CM

## 2019-03-29 DIAGNOSIS — R600 Localized edema: Secondary | ICD-10-CM | POA: Diagnosis not present

## 2019-03-29 DIAGNOSIS — I129 Hypertensive chronic kidney disease with stage 1 through stage 4 chronic kidney disease, or unspecified chronic kidney disease: Secondary | ICD-10-CM | POA: Diagnosis not present

## 2019-03-29 DIAGNOSIS — I161 Hypertensive emergency: Secondary | ICD-10-CM | POA: Diagnosis present

## 2019-03-29 DIAGNOSIS — N179 Acute kidney failure, unspecified: Secondary | ICD-10-CM | POA: Diagnosis present

## 2019-03-29 DIAGNOSIS — Z6841 Body Mass Index (BMI) 40.0 and over, adult: Secondary | ICD-10-CM

## 2019-03-29 DIAGNOSIS — E440 Moderate protein-calorie malnutrition: Secondary | ICD-10-CM | POA: Diagnosis present

## 2019-03-29 DIAGNOSIS — Z992 Dependence on renal dialysis: Secondary | ICD-10-CM | POA: Diagnosis not present

## 2019-03-29 LAB — BASIC METABOLIC PANEL
Anion gap: 10 (ref 5–15)
BUN: 41 mg/dL — ABNORMAL HIGH (ref 6–20)
CO2: 28 mmol/L (ref 22–32)
Calcium: 8.5 mg/dL — ABNORMAL LOW (ref 8.9–10.3)
Chloride: 101 mmol/L (ref 98–111)
Creatinine, Ser: 5.14 mg/dL — ABNORMAL HIGH (ref 0.61–1.24)
GFR calc Af Amer: 13 mL/min — ABNORMAL LOW (ref >60)
GFR calc non Af Amer: 11 mL/min — ABNORMAL LOW (ref >60)
Glucose, Bld: 62 mg/dL — ABNORMAL LOW (ref 70–99)
Potassium: 3.1 mmol/L — ABNORMAL LOW (ref 3.5–5.1)
Sodium: 139 mmol/L (ref 135–145)

## 2019-03-29 LAB — TROPONIN I (HIGH SENSITIVITY): Troponin I (High Sensitivity): 32 ng/L — ABNORMAL HIGH (ref <18)

## 2019-03-29 LAB — CBC
HCT: 26.6 % — ABNORMAL LOW (ref 39.0–52.0)
Hemoglobin: 7.9 g/dL — ABNORMAL LOW (ref 13.0–17.0)
MCH: 24.6 pg — ABNORMAL LOW (ref 26.0–34.0)
MCHC: 29.7 g/dL — ABNORMAL LOW (ref 30.0–36.0)
MCV: 82.9 fL (ref 80.0–100.0)
Platelets: 362 10*3/uL (ref 150–400)
RBC: 3.21 MIL/uL — ABNORMAL LOW (ref 4.22–5.81)
RDW: 15.6 % — ABNORMAL HIGH (ref 11.5–15.5)
WBC: 7.8 10*3/uL (ref 4.0–10.5)
nRBC: 0 % (ref 0.0–0.2)

## 2019-03-29 LAB — TYPE AND SCREEN
ABO/RH(D): AB POS
Antibody Screen: NEGATIVE

## 2019-03-29 LAB — GLUCOSE, CAPILLARY
Glucose-Capillary: 55 mg/dL — ABNORMAL LOW (ref 70–99)
Glucose-Capillary: 58 mg/dL — ABNORMAL LOW (ref 70–99)
Glucose-Capillary: 66 mg/dL — ABNORMAL LOW (ref 70–99)
Glucose-Capillary: 92 mg/dL (ref 70–99)
Glucose-Capillary: 115 mg/dL — ABNORMAL HIGH (ref 70–99)
Glucose-Capillary: 88 mg/dL (ref 70–99)
Glucose-Capillary: 97 mg/dL (ref 70–99)
Glucose-Capillary: 121 mg/dL — ABNORMAL HIGH (ref 70–99)

## 2019-03-29 LAB — BRAIN NATRIURETIC PEPTIDE: B Natriuretic Peptide: 891 pg/mL — ABNORMAL HIGH (ref 0.0–100.0)

## 2019-03-29 LAB — ECHOCARDIOGRAM COMPLETE
Height: 71 in
Weight: 4934.78 oz

## 2019-03-29 LAB — MRSA PCR SCREENING: MRSA by PCR: NEGATIVE

## 2019-03-29 LAB — TSH: TSH: 2.484 u[IU]/mL (ref 0.350–4.500)

## 2019-03-29 LAB — SARS CORONAVIRUS 2 BY RT PCR (HOSPITAL ORDER, PERFORMED IN ~~LOC~~ HOSPITAL LAB): SARS Coronavirus 2: NEGATIVE

## 2019-03-29 IMAGING — CR CHEST - 2 VIEW
1 series · 2 of 2 positions shown · non-contrast
Comparison: [DATE]

CLINICAL DATA: Leg swelling for 1 week. Shortness of breath.

EXAM:
CHEST - 2 VIEW

[Series 1: dg chest 2 view · 0.14mm/px · 2 of 2 slices shown]
[im 1/2]
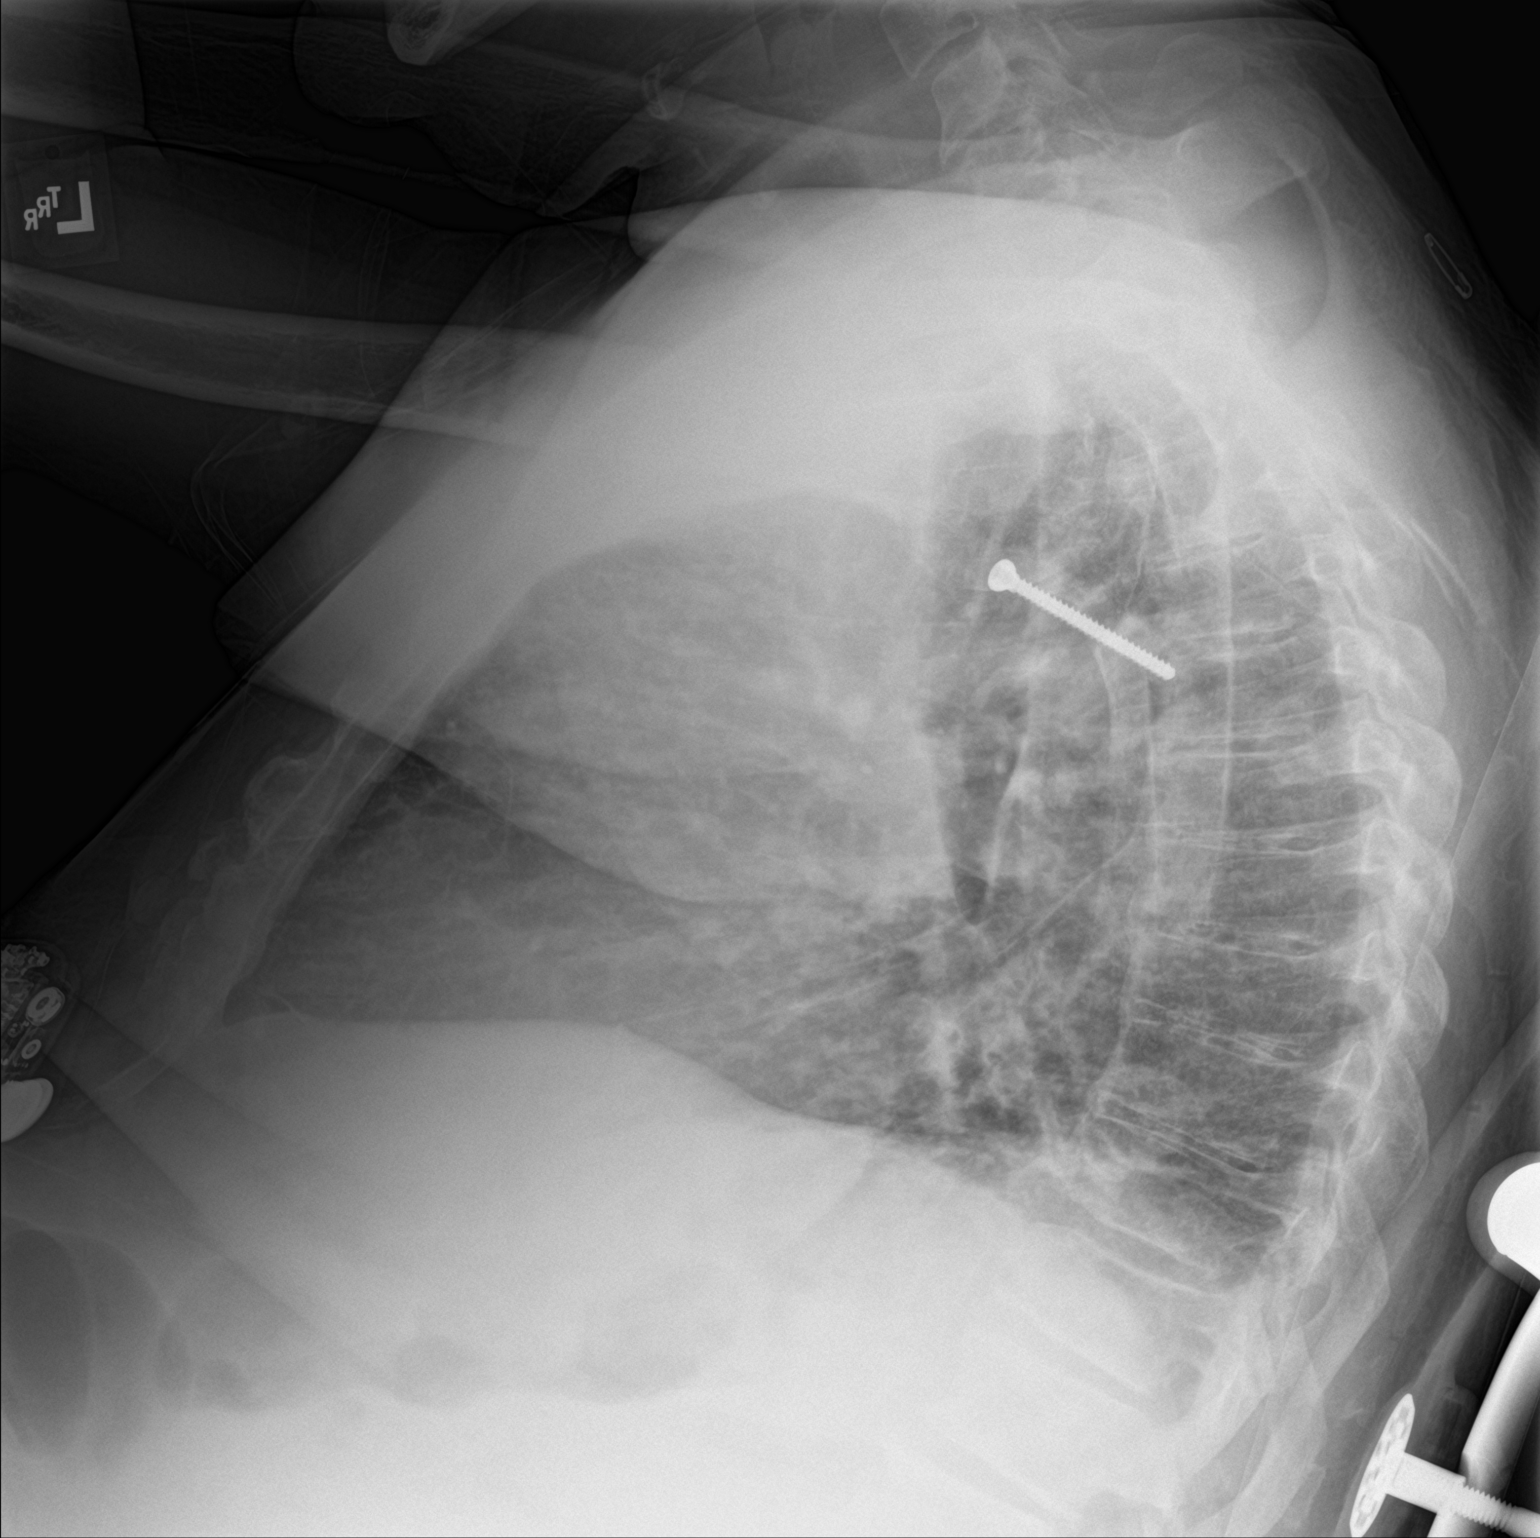
[im 2/2]
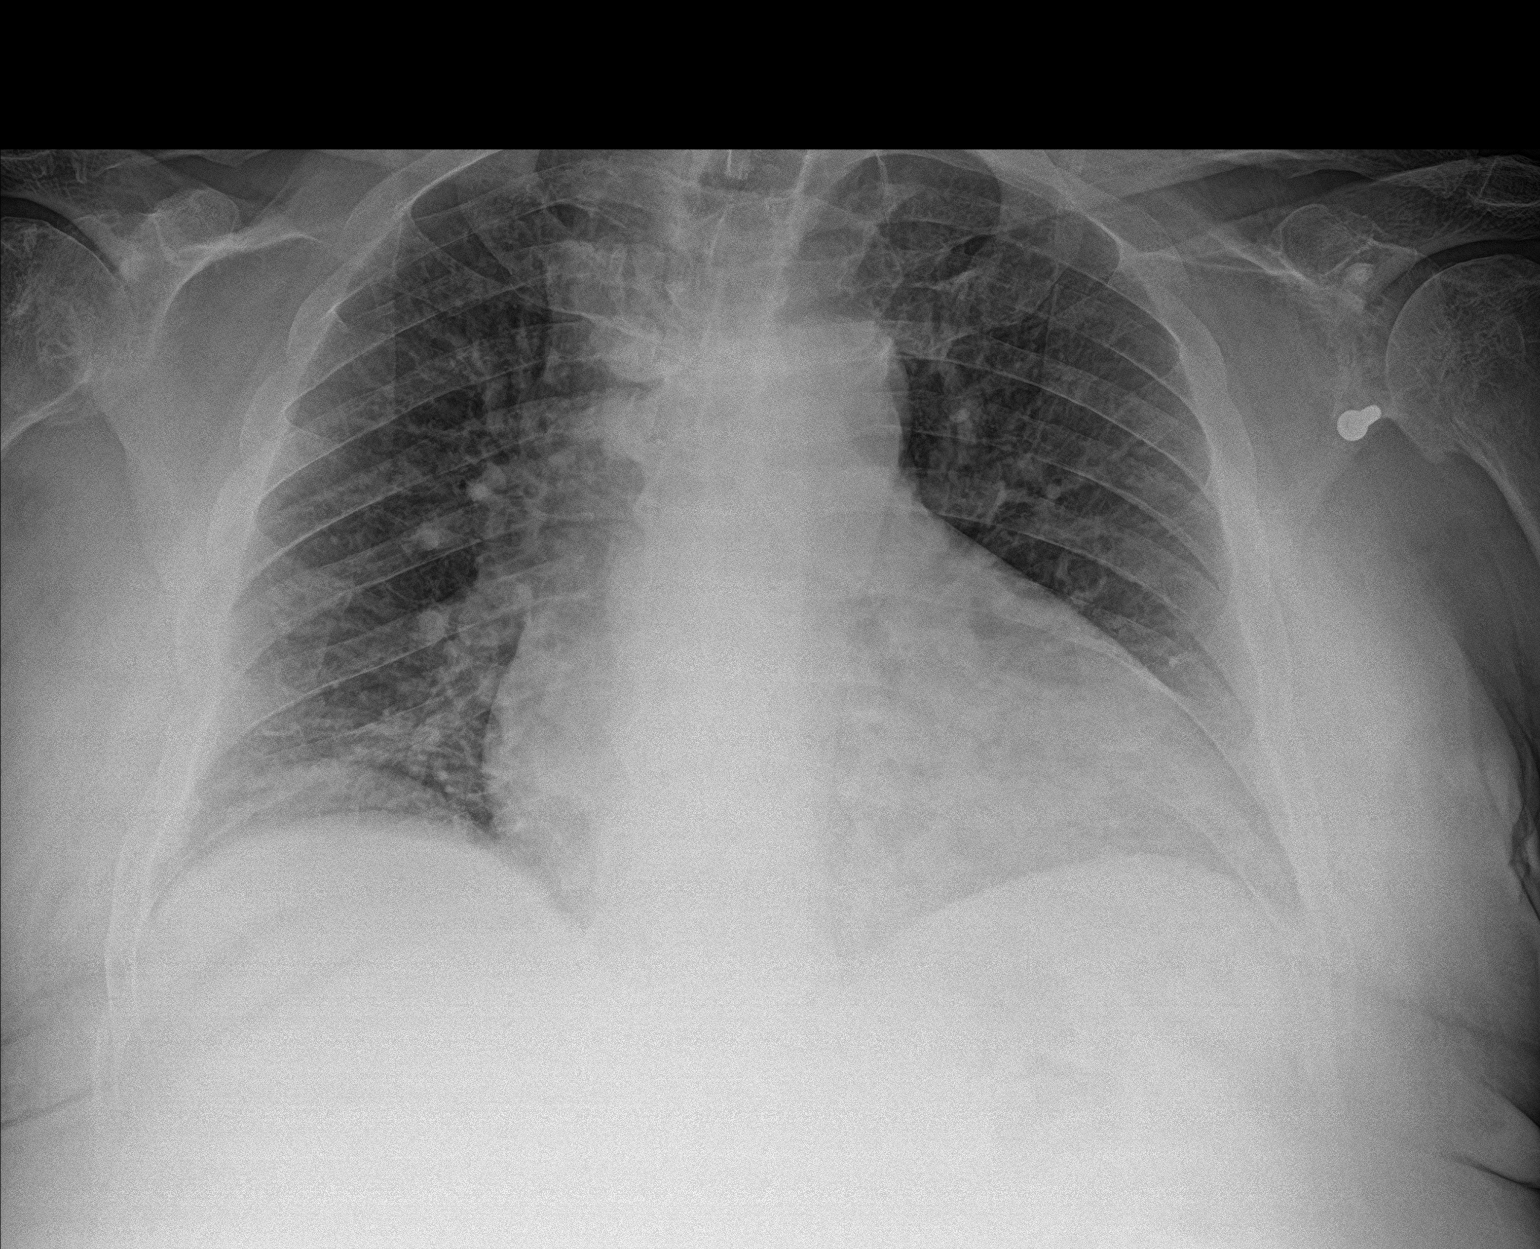

[2 of 2 positions shown; findings below may reference images not displayed]

FINDINGS: Cardiomegaly with unchanged mediastinal contours. Vascular
congestion with some interstitial prominence suspicious for
pulmonary edema. No significant pleural effusion. Mild right lung
base scarring. No confluent airspace disease. Surgical screw in the
left glenoid. Safety pin projects over the left posterior upper
back.
IMPRESSION: Cardiomegaly with vascular congestion and interstitial prominence,
suspicious for pulmonary edema.

## 2019-03-29 MED ORDER — LABETALOL HCL 5 MG/ML IV SOLN
10.0000 mg | INTRAVENOUS | Status: DC | PRN
  Administered 2019-03-29 – 2019-04-05 (×6): 10 mg via INTRAVENOUS
  Filled 2019-03-29 (×8): qty 4

## 2019-03-29 MED ORDER — ASPIRIN EC 81 MG PO TBEC
81.0000 mg | DELAYED_RELEASE_TABLET | Freq: Every day | ORAL | Status: DC
  Administered 2019-03-29 – 2019-04-06 (×9): 81 mg via ORAL
  Filled 2019-03-29 (×9): qty 1

## 2019-03-29 MED ORDER — DOCUSATE SODIUM 100 MG PO CAPS
100.0000 mg | ORAL_CAPSULE | Freq: Two times a day (BID) | ORAL | Status: DC
  Administered 2019-03-29 – 2019-04-06 (×13): 100 mg via ORAL
  Filled 2019-03-29 (×14): qty 1

## 2019-03-29 MED ORDER — LABETALOL HCL 5 MG/ML IV SOLN
10.0000 mg | Freq: Once | INTRAVENOUS | Status: AC
  Administered 2019-03-29: 10 mg via INTRAVENOUS
  Filled 2019-03-29: qty 4

## 2019-03-29 MED ORDER — POTASSIUM CHLORIDE CRYS ER 20 MEQ PO TBCR
40.0000 meq | EXTENDED_RELEASE_TABLET | Freq: Once | ORAL | Status: AC
  Administered 2019-03-29: 40 meq via ORAL
  Filled 2019-03-29: qty 2

## 2019-03-29 MED ORDER — ONDANSETRON HCL 4 MG PO TABS: 4.0000 mg | ORAL_TABLET | Freq: Four times a day (QID) | ORAL | Status: DC | PRN

## 2019-03-29 MED ORDER — ACETAMINOPHEN 650 MG RE SUPP: 650.0000 mg | Freq: Four times a day (QID) | RECTAL | Status: DC | PRN

## 2019-03-29 MED ORDER — INSULIN ASPART 100 UNIT/ML ~~LOC~~ SOLN
0.0000 [IU] | Freq: Three times a day (TID) | SUBCUTANEOUS | Status: DC
  Administered 2019-03-30 – 2019-04-06 (×6): 1 [IU] via SUBCUTANEOUS
  Filled 2019-03-29 (×6): qty 1

## 2019-03-29 MED ORDER — NITROGLYCERIN 2 % TD OINT
0.5000 [in_us] | TOPICAL_OINTMENT | TRANSDERMAL | Status: AC
  Administered 2019-03-29: 0.5 [in_us] via TOPICAL
  Filled 2019-03-29: qty 1

## 2019-03-29 MED ORDER — NICARDIPINE HCL IN NACL 20-0.86 MG/200ML-% IV SOLN
3.0000 mg/h | INTRAVENOUS | Status: DC
  Administered 2019-03-29: 10 mg/h via INTRAVENOUS
  Administered 2019-03-29: 5 mg/h via INTRAVENOUS
  Filled 2019-03-29 (×4): qty 200

## 2019-03-29 MED ORDER — LABETALOL HCL 5 MG/ML IV SOLN
10.0000 mg | Freq: Once | INTRAVENOUS | Status: AC
  Administered 2019-03-29: 10 mg via INTRAVENOUS

## 2019-03-29 MED ORDER — METOPROLOL TARTRATE 25 MG PO TABS
25.0000 mg | ORAL_TABLET | Freq: Two times a day (BID) | ORAL | Status: DC
  Administered 2019-03-29: 25 mg via ORAL
  Filled 2019-03-29: qty 1

## 2019-03-29 MED ORDER — CHLORHEXIDINE GLUCONATE CLOTH 2 % EX PADS
6.0000 | MEDICATED_PAD | Freq: Every day | CUTANEOUS | Status: DC
  Administered 2019-03-29 – 2019-04-04 (×6): 6 via TOPICAL
  Filled 2019-03-29: qty 6

## 2019-03-29 MED ORDER — HYDROCHLOROTHIAZIDE 25 MG PO TABS
25.0000 mg | ORAL_TABLET | Freq: Every day | ORAL | Status: DC
  Administered 2019-03-29 – 2019-03-30 (×2): 25 mg via ORAL
  Filled 2019-03-29 (×2): qty 1

## 2019-03-29 MED ORDER — LABETALOL HCL 200 MG PO TABS
200.0000 mg | ORAL_TABLET | Freq: Two times a day (BID) | ORAL | Status: DC
  Administered 2019-03-29 – 2019-04-06 (×16): 200 mg via ORAL
  Filled 2019-03-29 (×19): qty 1

## 2019-03-29 MED ORDER — SERTRALINE HCL 50 MG PO TABS
100.0000 mg | ORAL_TABLET | Freq: Every day | ORAL | Status: DC
  Administered 2019-03-29 – 2019-04-06 (×9): 100 mg via ORAL
  Filled 2019-03-29 (×9): qty 2

## 2019-03-29 MED ORDER — ONDANSETRON HCL 4 MG/2ML IJ SOLN
4.0000 mg | Freq: Four times a day (QID) | INTRAMUSCULAR | Status: DC | PRN
  Administered 2019-03-29: 4 mg via INTRAVENOUS
  Filled 2019-03-29: qty 2

## 2019-03-29 MED ORDER — ALUM & MAG HYDROXIDE-SIMETH 200-200-20 MG/5ML PO SUSP
30.0000 mL | Freq: Once | ORAL | Status: AC
  Administered 2019-03-29: 30 mL via ORAL
  Filled 2019-03-29: qty 30

## 2019-03-29 MED ORDER — HEPARIN SODIUM (PORCINE) 5000 UNIT/ML IJ SOLN
5000.0000 [IU] | Freq: Three times a day (TID) | INTRAMUSCULAR | Status: DC
  Administered 2019-03-29 – 2019-04-06 (×21): 5000 [IU] via SUBCUTANEOUS
  Filled 2019-03-29 (×21): qty 1

## 2019-03-29 MED ORDER — INSULIN ASPART 100 UNIT/ML ~~LOC~~ SOLN
0.0000 [IU] | Freq: Every day | SUBCUTANEOUS | Status: DC
  Administered 2019-03-29: 1 [IU] via SUBCUTANEOUS
  Administered 2019-04-03: 2 [IU] via SUBCUTANEOUS
  Filled 2019-03-29: qty 1

## 2019-03-29 MED ORDER — FUROSEMIDE 10 MG/ML IJ SOLN
20.0000 mg | Freq: Once | INTRAMUSCULAR | Status: AC
  Administered 2019-03-29: 20 mg via INTRAVENOUS
  Filled 2019-03-29: qty 4

## 2019-03-29 MED ORDER — ACETAMINOPHEN 325 MG PO TABS
650.0000 mg | ORAL_TABLET | Freq: Four times a day (QID) | ORAL | Status: DC | PRN
  Administered 2019-03-29 (×2): 650 mg via ORAL
  Filled 2019-03-29 (×2): qty 2
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
## 2019-03-29 NOTE — Progress Notes (Signed)
*  PRELIMINARY RESULTS* Echocardiogram 2D Echocardiogram has been performed.  Steve Vance 03/29/2019, 11:42 AM

## 2019-03-29 NOTE — Progress Notes (Signed)
Hypoglycemic Event  CBG: 55 at 0553  Treatment: 8 oz juice/soda  Symptoms: Hungry  Follow-up CBG: Time: 0610 CBG Result: 66 Follow-up CBG: Time: 0630 CBG Result: 92  Possible Reasons for Event: Pt kept NPO in ED  Comments/MD notified:Dana Melene Muller, NP aware    Dena Billet, RN
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
## 2019-03-29 NOTE — ED Notes (Signed)
Pt placed on 2L BNC to help with SOB.

## 2019-03-29 NOTE — ED Triage Notes (Signed)
Pt presents to ED with bilateral leg swelling for over a week with slight sob. Pt states swelling starts in his abd and goes into his testicles and down into his legs. Denies similar symptoms previously. Pt reports he has not been taking BP his mediations as prescribed.
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
## 2019-03-29 NOTE — Progress Notes (Signed)
Pt admitted to [place] from ED. Pt on nicardipine gtt infusing. Report received via phone from [place]. See flowsheets for more details. Will continue to monitor.

## 2019-03-29 NOTE — Progress Notes (Signed)
[facility] NAME: Danni Leabo    MR#:  [id number]  DATE OF BIRTH:  [birth year]  SUBJECTIVE:  CHIEF COMPLAINT:   Chief Complaint  Patient presents with  . Leg Swelling  . Shortness of Breath   No new complaint this morning.  Shortness of breath and wheezing improved.  2D echocardiogram being done at bedside. REVIEW OF SYSTEMS:  Review of Systems  Constitutional: Negative for chills and fever.  HENT: Negative for hearing loss and tinnitus.   Eyes: Negative for blurred vision and double vision.  Respiratory: Negative for cough and hemoptysis.        Shortness of breath improved  Cardiovascular: Negative for chest pain and palpitations.  Gastrointestinal: Negative for heartburn and nausea.  Genitourinary: Negative for dysuria.  Musculoskeletal: Negative for myalgias and neck pain.  Skin: Negative for itching and rash.  Neurological: Negative for dizziness and headaches.  Psychiatric/Behavioral: Negative for depression and hallucinations.    DRUG ALLERGIES:   Allergies  Allergen Reactions  . Percocet [Oxycodone-Acetaminophen] Nausea And Vomiting   VITALS:  Blood pressure (!) 167/99, pulse 67, temperature (!) 97.3 F (36.3 C), temperature source Oral, resp. rate (!) 21, height $RemoveBe'5\' 11"'dOfEMJyWL$  (1.803 m), weight (!) 139.9 kg, SpO2 100 %. PHYSICAL EXAMINATION:   Physical Exam  Constitutional: He is oriented to person, place, and time. He appears well-developed.  HENT:  Head: Normocephalic.  Right Ear: External ear normal.  Left Ear: External ear normal.  Eyes: Pupils are equal, round, and reactive to light. Conjunctivae are normal. Right eye exhibits no discharge.  Neck: Normal range of motion. Neck supple. No thyromegaly present.  Cardiovascular: Normal rate, regular rhythm and normal heart sounds.  Respiratory: Effort normal. No respiratory distress.  GI: Soft. Bowel sounds are normal. There is no abdominal tenderness.   Musculoskeletal: Normal range of motion.        General: No edema.  Neurological: He is alert and oriented to person, place, and time. No cranial nerve deficit.  Skin: Skin is warm. He is not diaphoretic. No erythema.  Psychiatric: He has a normal mood and affect. Thought content normal.   LABORATORY PANEL:  Male CBC Recent Labs  Lab 03/29/19 0128  WBC 7.8  HGB 7.9*  HCT 26.6*  PLT 362   ------------------------------------------------------------------------------------------------------------------ Chemistries  Recent Labs  Lab 03/29/19 0128  NA 139  K 3.1*  CL 101  CO2 28  GLUCOSE 62*  BUN 41*  CREATININE 5.14*  CALCIUM 8.5*   RADIOLOGY:  Dg Chest 2 View  Result Date: 03/29/2019 CLINICAL DATA:  Leg swelling for 1 week. Shortness of breath. EXAM: CHEST - 2 VIEW COMPARISON:  07/16/2017 FINDINGS: Cardiomegaly with unchanged mediastinal contours. Vascular congestion with some interstitial prominence suspicious for pulmonary edema. No significant pleural effusion. Mild right lung base scarring. No confluent airspace disease. Surgical screw in the left glenoid. Safety pin projects over the left posterior upper back. IMPRESSION: Cardiomegaly with vascular congestion and interstitial prominence, suspicious for pulmonary edema. Electronically Signed   By: Keith Rake M.D.   On: 03/29/2019 02:47   ASSESSMENT AND PLAN:   This is a 58 year old male admitted for hypertensive emergency. 1.  Hypertensive emergency: Significant worsening kidney failure as well as mild increase in troponin.  Continue nicardipine drip.  Transition to p.o. blood pressure meds once weaned off nicardipine drip.   Being managed by ICU team.    2.  Acute kidney injury: Superimposed upon chronic kidney failure.  Hold lisinopril.  Avoid other nephrotoxic agents.  Consulted nephrology for further evaluation and management.  3.  CHF: Acute on chronic; diastolic.   Was given a dose of IV Lasix this morning.   Further evaluation and recommendation from nephrology and ICU team.  Follow-up on 2D echocardiogram report  4.  Diabetes mellitus type 2: Hold oral hypoglycemic agents.  Sliding scale insulin while hospitalized.  5.  Depression: Continue Zoloft   DVT prophylaxis: Heparin   All the records are reviewed and case discussed with Care Management/Social Worker. Management plans discussed with the patient, family and they are in agreement.  CODE STATUS: Full Code  TOTAL TIME TAKING CARE OF THIS PATIENT: 38 minutes.   More than 50% of the time was spent in counseling/coordination of care: YES  POSSIBLE D/C IN 2 DAYS, DEPENDING ON CLINICAL CONDITION.   Ceira Hoeschen M.D on 03/29/2019 at 4:20 PM  Between 7am to 6pm - Pager - [phone]  After 6pm go to [email or web address] - [employer] [facility]  Office  [phone]  CC: Primary care physician; [place]  Note: This dictation was prepared with Dragon dictation along with smaller Company secretary. Any transcriptional errors that result from this process are unintentional.

## 2019-03-30 ENCOUNTER — Inpatient Hospital Stay: Payer: Medicare Other

## 2019-03-30 LAB — CBC
HCT: 25.6 % — ABNORMAL LOW (ref 39.0–52.0)
Hemoglobin: 7.6 g/dL — ABNORMAL LOW (ref 13.0–17.0)
MCH: 24.1 pg — ABNORMAL LOW (ref 26.0–34.0)
MCHC: 29.7 g/dL — ABNORMAL LOW (ref 30.0–36.0)
MCV: 81 fL (ref 80.0–100.0)
Platelets: 332 10*3/uL (ref 150–400)
RBC: 3.16 MIL/uL — ABNORMAL LOW (ref 4.22–5.81)
RDW: 15.5 % (ref 11.5–15.5)
WBC: 5.5 10*3/uL (ref 4.0–10.5)
nRBC: 0 % (ref 0.0–0.2)

## 2019-03-30 LAB — COMPREHENSIVE METABOLIC PANEL
ALT: 24 U/L (ref 0–44)
AST: 23 U/L (ref 15–41)
Albumin: 2.6 g/dL — ABNORMAL LOW (ref 3.5–5.0)
Alkaline Phosphatase: 85 U/L (ref 38–126)
Anion gap: 9 (ref 5–15)
BUN: 40 mg/dL — ABNORMAL HIGH (ref 6–20)
CO2: 25 mmol/L (ref 22–32)
Calcium: 8.4 mg/dL — ABNORMAL LOW (ref 8.9–10.3)
Chloride: 102 mmol/L (ref 98–111)
Creatinine, Ser: 4.7 mg/dL — ABNORMAL HIGH (ref 0.61–1.24)
GFR calc Af Amer: 15 mL/min — ABNORMAL LOW (ref >60)
GFR calc non Af Amer: 13 mL/min — ABNORMAL LOW (ref >60)
Glucose, Bld: 133 mg/dL — ABNORMAL HIGH (ref 70–99)
Potassium: 3.5 mmol/L (ref 3.5–5.1)
Sodium: 136 mmol/L (ref 135–145)
Total Bilirubin: 0.5 mg/dL (ref 0.3–1.2)
Total Protein: 6.8 g/dL (ref 6.5–8.1)

## 2019-03-30 LAB — GLUCOSE, CAPILLARY
Glucose-Capillary: 117 mg/dL — ABNORMAL HIGH (ref 70–99)
Glucose-Capillary: 132 mg/dL — ABNORMAL HIGH (ref 70–99)
Glucose-Capillary: 146 mg/dL — ABNORMAL HIGH (ref 70–99)
Glucose-Capillary: 134 mg/dL — ABNORMAL HIGH (ref 70–99)

## 2019-03-30 LAB — PROTEIN / CREATININE RATIO, URINE
Creatinine, Urine: 134 mg/dL
Protein Creatinine Ratio: 3.75 mg/mg{Cre} — ABNORMAL HIGH (ref 0.00–0.15)
Total Protein, Urine: 503 mg/dL

## 2019-03-30 LAB — HEMOGLOBIN A1C
Hgb A1c MFr Bld: 6.8 % — ABNORMAL HIGH (ref 4.8–5.6)
Mean Plasma Glucose: 148.46 mg/dL

## 2019-03-30 LAB — PHOSPHORUS: Phosphorus: 5.7 mg/dL — ABNORMAL HIGH (ref 2.5–4.6)

## 2019-03-30 MED ORDER — METOPROLOL TARTRATE 25 MG PO TABS
25.0000 mg | ORAL_TABLET | Freq: Two times a day (BID) | ORAL | Status: DC
  Administered 2019-03-30 – 2019-04-04 (×9): 25 mg via ORAL
  Filled 2019-03-30 (×10): qty 1

## 2019-03-30 MED ORDER — PANTOPRAZOLE SODIUM 40 MG IV SOLR
40.0000 mg | Freq: Two times a day (BID) | INTRAVENOUS | Status: DC
  Administered 2019-03-30 – 2019-04-03 (×10): 40 mg via INTRAVENOUS
  Filled 2019-03-30 (×10): qty 40

## 2019-03-30 MED ORDER — HYDRALAZINE HCL 20 MG/ML IJ SOLN
10.0000 mg | INTRAMUSCULAR | Status: DC | PRN
  Administered 2019-03-30 (×2): 10 mg via INTRAVENOUS
  Administered 2019-04-03 – 2019-04-05 (×3): 20 mg via INTRAVENOUS
  Administered 2019-04-05 – 2019-04-06 (×2): 10 mg via INTRAVENOUS
  Filled 2019-03-30 (×7): qty 1

## 2019-03-30 MED ORDER — ALUM & MAG HYDROXIDE-SIMETH 200-200-20 MG/5ML PO SUSP
30.0000 mL | Freq: Once | ORAL | Status: DC
  Filled 2019-03-30: qty 30

## 2019-03-30 MED ORDER — FUROSEMIDE 10 MG/ML IJ SOLN
8.0000 mg/h | INTRAVENOUS | Status: DC
  Administered 2019-03-30 – 2019-04-02 (×3): 8 mg/h via INTRAVENOUS
  Filled 2019-03-30 (×3): qty 25

## 2019-03-30 MED ORDER — HYDROCODONE-ACETAMINOPHEN 5-325 MG PO TABS
1.0000 | ORAL_TABLET | Freq: Four times a day (QID) | ORAL | Status: DC | PRN
  Administered 2019-03-30 – 2019-03-31 (×2): 2 via ORAL
  Administered 2019-04-02 – 2019-04-05 (×3): 1 via ORAL
  Filled 2019-03-30: qty 2
  Filled 2019-03-30 (×2): qty 1
  Filled 2019-03-30: qty 2
  Filled 2019-03-30: qty 1
  Filled 2019-03-30: qty 2

## 2019-03-30 MED ORDER — LIDOCAINE VISCOUS HCL 2 % MT SOLN
15.0000 mL | Freq: Once | OROMUCOSAL | Status: DC
  Filled 2019-03-30: qty 15

## 2019-03-30 MED ORDER — AMLODIPINE BESYLATE 5 MG PO TABS
5.0000 mg | ORAL_TABLET | Freq: Every day | ORAL | Status: DC
  Administered 2019-03-30 – 2019-04-05 (×6): 5 mg via ORAL
  Filled 2019-03-30 (×6): qty 1
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
## 2019-03-30 NOTE — Progress Notes (Signed)
[place] [place] NAME: Steve Vance    MR#:  [id number]  DATE OF BIRTH:  [birth year]  SUBJECTIVE:  CHIEF COMPLAINT:   Chief Complaint  Patient presents with  . Leg Swelling  . Shortness of Breath   No new complaint this morning.  Shortness of breath and wheezing improved.  Patient weaned off nicardipine drip   REVIEW OF SYSTEMS:  Review of Systems  Constitutional: Negative for chills and fever.  HENT: Negative for hearing loss and tinnitus.   Eyes: Negative for blurred vision and double vision.  Respiratory: Negative for cough and hemoptysis.        Shortness of breath improved  Cardiovascular: Negative for chest pain and palpitations.  Gastrointestinal: Negative for heartburn and nausea.  Genitourinary: Negative for dysuria.  Musculoskeletal: Negative for myalgias and neck pain.  Skin: Negative for itching and rash.  Neurological: Negative for dizziness and headaches.  Psychiatric/Behavioral: Negative for depression and hallucinations.    DRUG ALLERGIES:   Allergies  Allergen Reactions  . Percocet [Oxycodone-Acetaminophen] Nausea And Vomiting   VITALS:  Blood pressure (!) 179/106, pulse 73, temperature (!) 97.4 F (36.3 C), temperature source Oral, resp. rate 18, height $RemoveBe'5\' 11"'[id number]$  (1.803 m), weight (!) 142 kg, SpO2 100 %. PHYSICAL EXAMINATION:   Physical Exam  Constitutional: He is oriented to person, place, and time. He appears well-developed.  HENT:  Head: Normocephalic.  Right Ear: External ear normal.  Left Ear: External ear normal.  Eyes: Pupils are equal, round, and reactive to light. Conjunctivae are normal. Right eye exhibits no discharge.  Neck: Normal range of motion. Neck supple. No thyromegaly present.  Cardiovascular: Normal rate, regular rhythm and normal heart sounds.  Respiratory: Effort normal. No respiratory distress.  GI: Soft. Bowel sounds are normal. There is no abdominal tenderness.   Musculoskeletal: Normal range of motion.        General: No edema.  Neurological: He is alert and oriented to person, place, and time. No cranial nerve deficit.  Skin: Skin is warm. He is not diaphoretic. No erythema.  Psychiatric: He has a normal mood and affect. Thought content normal.   LABORATORY PANEL:  Male CBC Recent Labs  Lab 03/30/19 0456  WBC 5.5  HGB 7.6*  HCT 25.6*  PLT 332   ------------------------------------------------------------------------------------------------------------------ Chemistries  Recent Labs  Lab 03/30/19 0456  NA 136  K 3.5  CL 102  CO2 25  GLUCOSE 133*  BUN 40*  CREATININE 4.70*  CALCIUM 8.4*  AST 23  ALT 24  ALKPHOS 85  BILITOT 0.5   RADIOLOGY:  No results found. ASSESSMENT AND PLAN:   This is a 58 year old male admitted for hypertensive emergency. 1.  Hypertensive emergency: Significant worsening kidney failure as well as mild increase in troponin.  Patient initially placed on nicardipine drip and admitted to ICU.  Has been weaned off nicardipine drip now.  Continue nicardipine drip.  Transition to p.o. blood pressure meds once weaned off nicardipine drip.  Started on p.o. blood pressure meds. Being managed by ICU team.   Patient admitted to being noncompliant with meds.  Had been off blood pressure medications for a while.  2.  Acute kidney injury: Superimposed upon chronic kidney failure.  Hold lisinopril and hydrochlorothiazide.  Avoid other nephrotoxic agents.  Consulted nephrology for further evaluation and management.  Follow-up renal ultrasound.  3.  CHF: Acute on chronic; diastolic.   Patient currently on Lasix drip.  2D echocardiogram with ejection fraction  of 55 to 60%.  4.  Diabetes mellitus type 2: Hold oral hypoglycemic agents.  Sliding scale insulin while hospitalized.  5.  Depression: Continue Zoloft   DVT prophylaxis: Heparin   All the records are reviewed and case discussed with Care Management/Social  Worker. Management plans discussed with the patient, family and they are in agreement.  CODE STATUS: Full Code  TOTAL TIME TAKING CARE OF THIS PATIENT: 36 minutes.   More than 50% of the time was spent in counseling/coordination of care: YES  POSSIBLE D/C IN 2 DAYS, DEPENDING ON CLINICAL CONDITION.   [name] M.D on 03/30/2019 at 3:39 PM  Between 7am to 6pm - Pager - [phone]  After 6pm go to [email or web address] - Technical brewer [facility]  Office  [phone]  CC: Primary care physician; [place]  Note: This dictation was prepared with Dragon dictation along with smaller Company secretary. Any transcriptional errors that result from this process are unintentional.
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
## 2019-03-31 ENCOUNTER — Inpatient Hospital Stay: Payer: Medicare Other

## 2019-03-31 LAB — CBC
HCT: 25.8 % — ABNORMAL LOW (ref 39.0–52.0)
Hemoglobin: 7.8 g/dL — ABNORMAL LOW (ref 13.0–17.0)
MCH: 24.3 pg — ABNORMAL LOW (ref 26.0–34.0)
MCHC: 30.2 g/dL (ref 30.0–36.0)
MCV: 80.4 fL (ref 80.0–100.0)
Platelets: 357 10*3/uL (ref 150–400)
RBC: 3.21 MIL/uL — ABNORMAL LOW (ref 4.22–5.81)
RDW: 15.6 % — ABNORMAL HIGH (ref 11.5–15.5)
WBC: 5.3 10*3/uL (ref 4.0–10.5)
nRBC: 0 % (ref 0.0–0.2)

## 2019-03-31 LAB — MAGNESIUM: Magnesium: 2.1 mg/dL (ref 1.7–2.4)

## 2019-03-31 LAB — COMPREHENSIVE METABOLIC PANEL
ALT: 23 U/L (ref 0–44)
AST: 19 U/L (ref 15–41)
Albumin: 2.6 g/dL — ABNORMAL LOW (ref 3.5–5.0)
Alkaline Phosphatase: 86 U/L (ref 38–126)
Anion gap: 10 (ref 5–15)
BUN: 45 mg/dL — ABNORMAL HIGH (ref 6–20)
CO2: 24 mmol/L (ref 22–32)
Calcium: 8.7 mg/dL — ABNORMAL LOW (ref 8.9–10.3)
Chloride: 101 mmol/L (ref 98–111)
Creatinine, Ser: 5.16 mg/dL — ABNORMAL HIGH (ref 0.61–1.24)
GFR calc Af Amer: 13 mL/min — ABNORMAL LOW (ref >60)
GFR calc non Af Amer: 11 mL/min — ABNORMAL LOW (ref >60)
Glucose, Bld: 128 mg/dL — ABNORMAL HIGH (ref 70–99)
Potassium: 3.8 mmol/L (ref 3.5–5.1)
Sodium: 135 mmol/L (ref 135–145)
Total Bilirubin: 0.6 mg/dL (ref 0.3–1.2)
Total Protein: 7 g/dL (ref 6.5–8.1)

## 2019-03-31 LAB — GLUCOSE, CAPILLARY
Glucose-Capillary: 123 mg/dL — ABNORMAL HIGH (ref 70–99)
Glucose-Capillary: 142 mg/dL — ABNORMAL HIGH (ref 70–99)
Glucose-Capillary: 128 mg/dL — ABNORMAL HIGH (ref 70–99)
Glucose-Capillary: 145 mg/dL — ABNORMAL HIGH (ref 70–99)

## 2019-03-31 LAB — FERRITIN: Ferritin: 27 ng/mL (ref 24–336)

## 2019-03-31 LAB — ANA W/REFLEX IF POSITIVE: Anti Nuclear Antibody (ANA): NEGATIVE

## 2019-03-31 LAB — C3 COMPLEMENT: C3 Complement: 110 mg/dL (ref 82–167)

## 2019-03-31 LAB — PHOSPHORUS: Phosphorus: 6.4 mg/dL — ABNORMAL HIGH (ref 2.5–4.6)

## 2019-03-31 LAB — IRON AND TIBC
Iron: 20 ug/dL — ABNORMAL LOW (ref 45–182)
Saturation Ratios: 7 % — ABNORMAL LOW (ref 17.9–39.5)
TIBC: 290 ug/dL (ref 250–450)
UIBC: 270 ug/dL

## 2019-03-31 LAB — C4 COMPLEMENT: Complement C4, Body Fluid: 22 mg/dL (ref 14–44)

## 2019-03-31 LAB — PARATHYROID HORMONE, INTACT (NO CA): PTH: 190 pg/mL — ABNORMAL HIGH (ref 15–65)

## 2019-03-31 IMAGING — US US RENAL
1 series · 14 of 25 positions shown · non-contrast
Comparison: None.

CLINICAL DATA: 57-year-old male with acute renal failure. History
of diabetes.

EXAM:
RENAL / URINARY TRACT ULTRASOUND COMPLETE

[Series 1: us renal · 14 of 28 slices shown]
[im 1/28]
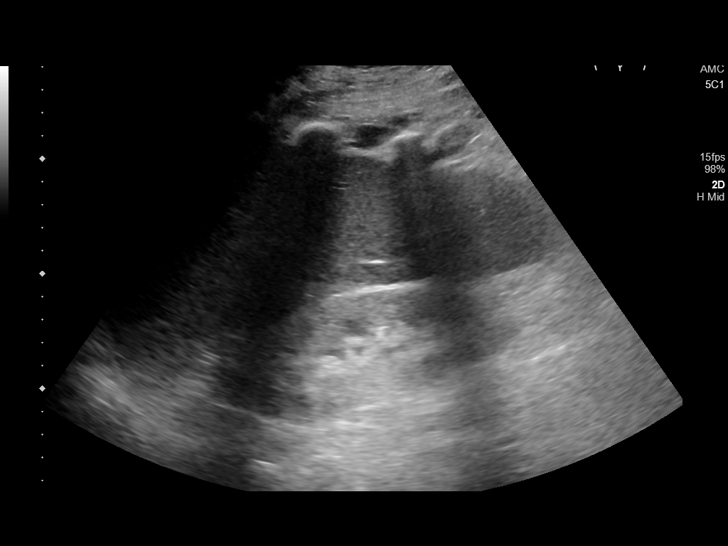
[im 3/28]
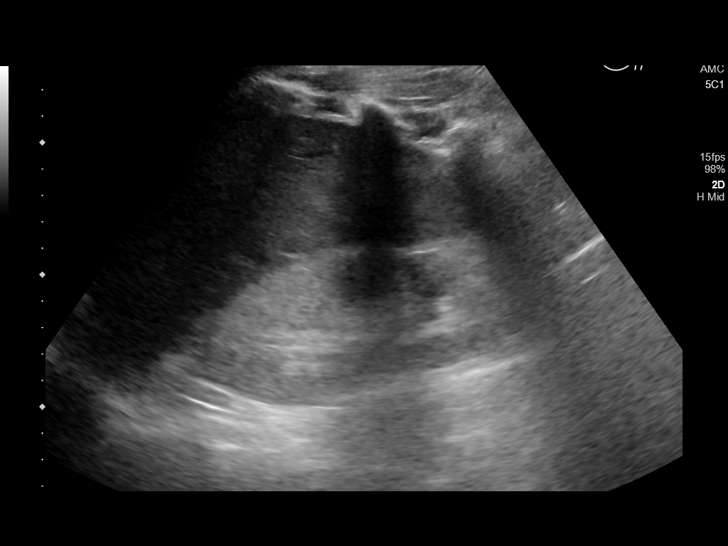
[im 5/28]
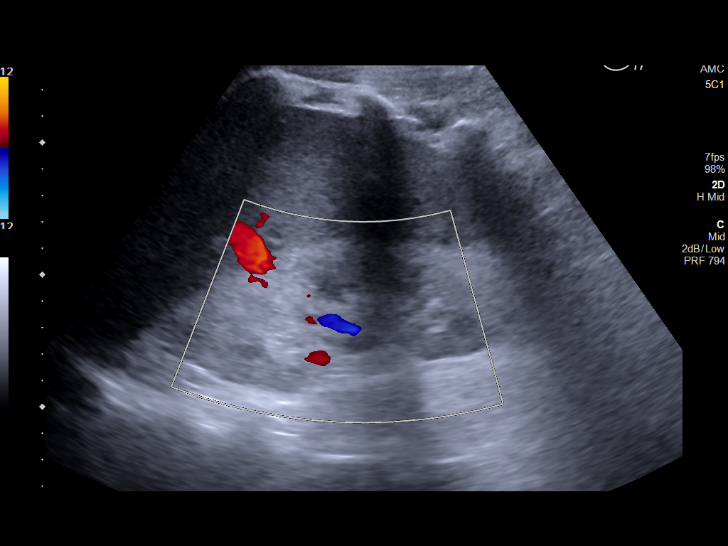
[im 7/28]
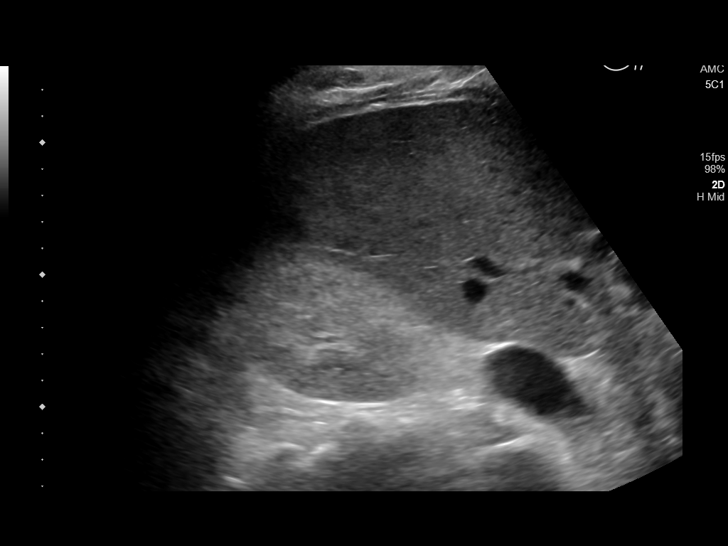
[im 10/28]
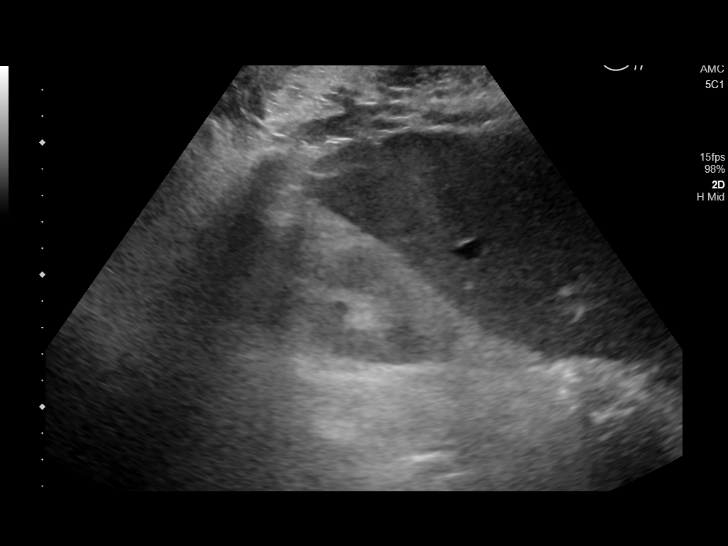
[im 11/28]
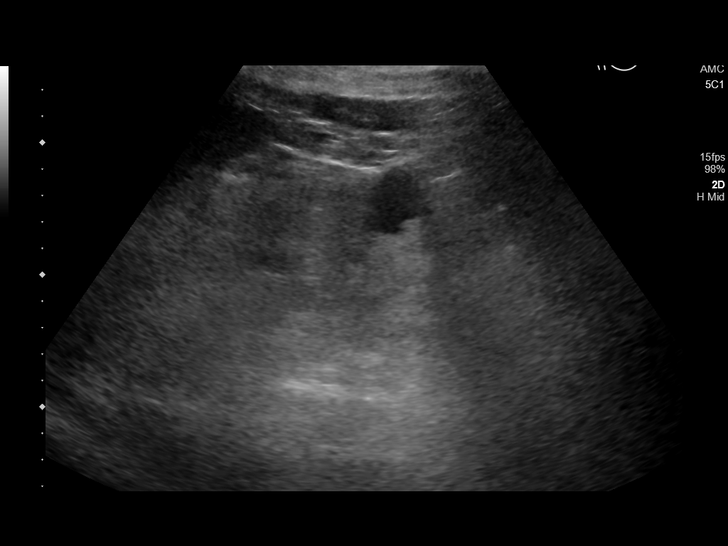
[im 13/28]
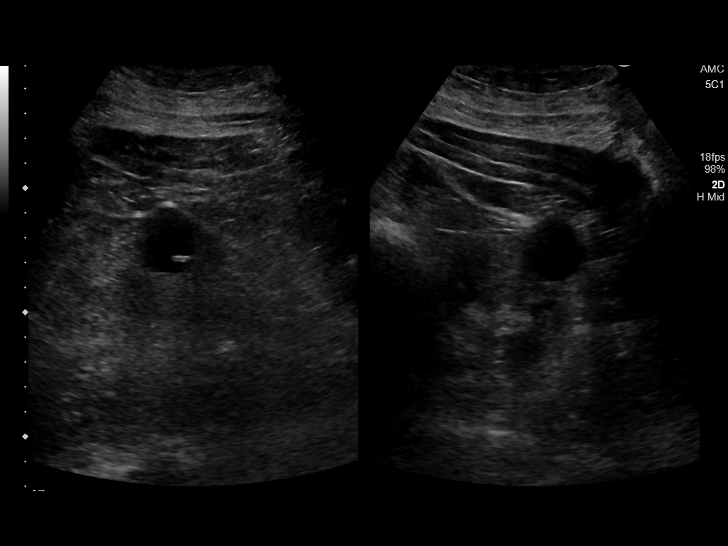
[im 15/28]
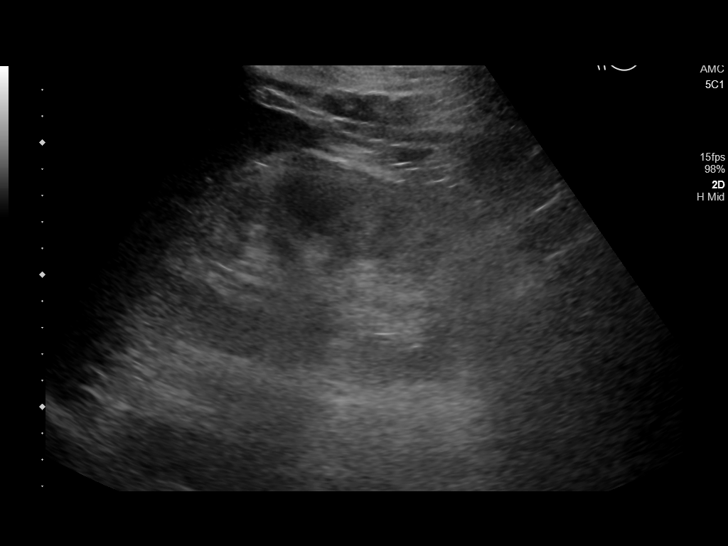
[im 17/28]
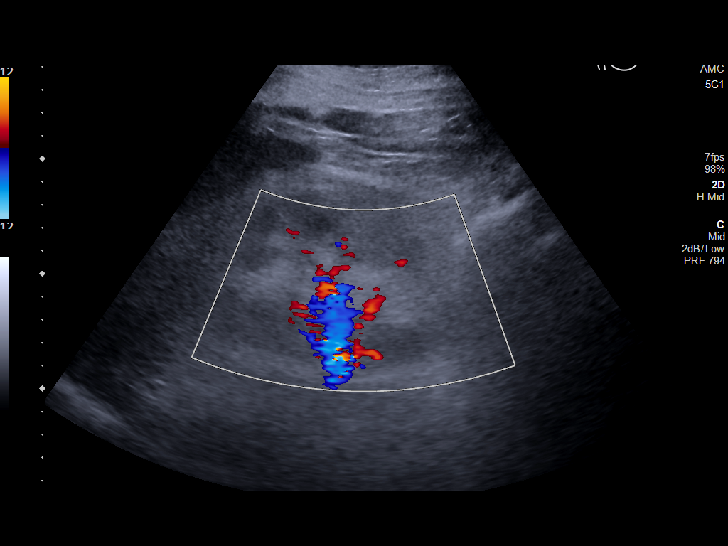
[im 19/28]
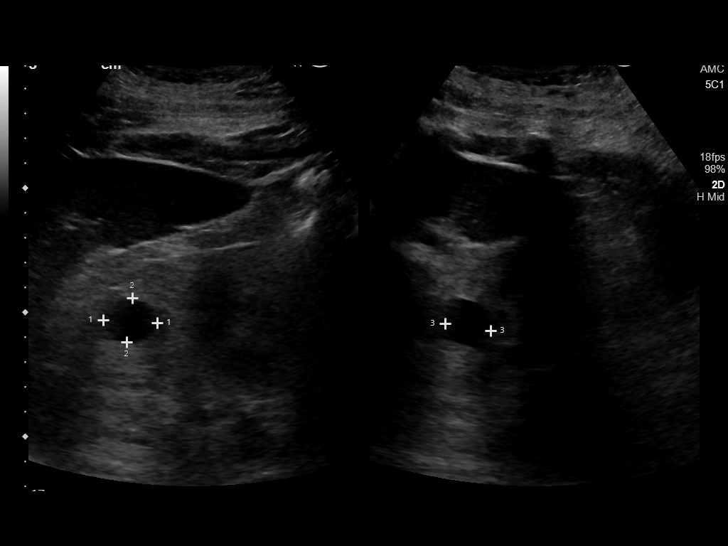
[im 21/28]
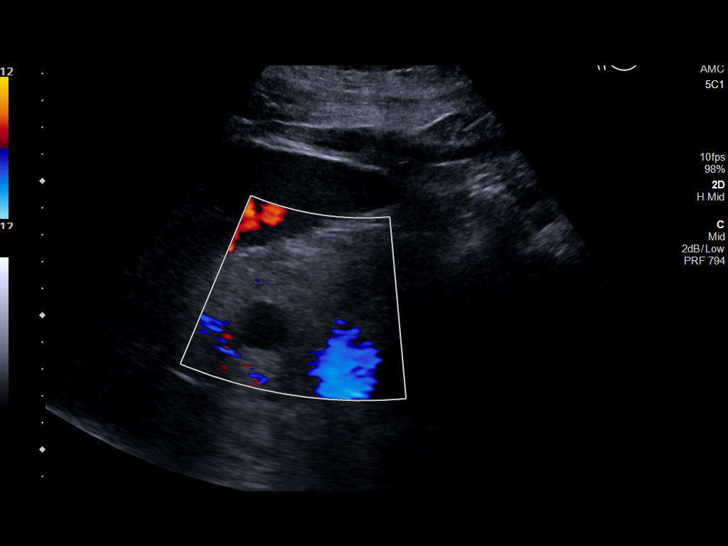
[im 23/28]
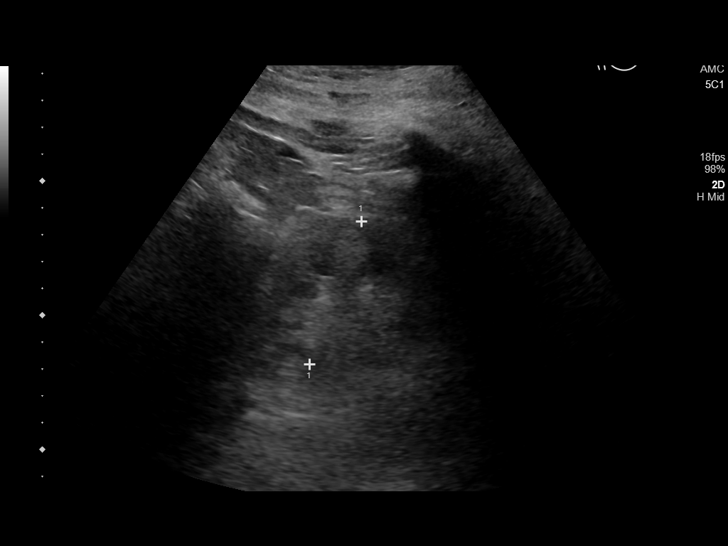
[im 25/28]
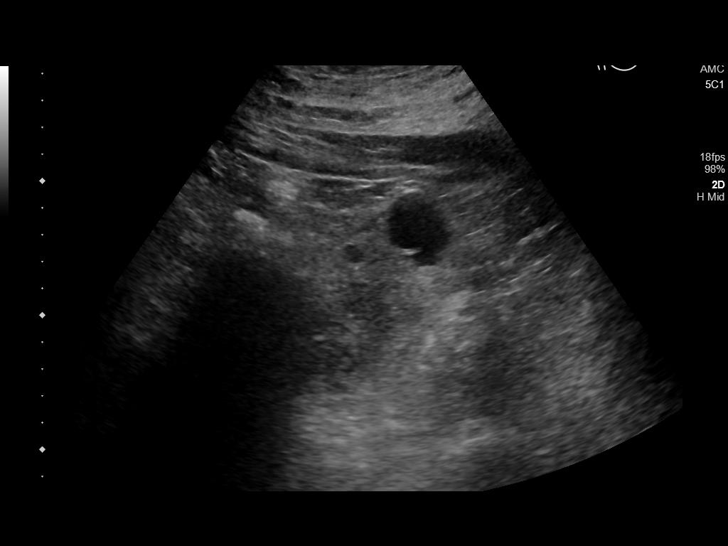
[im 28/28]
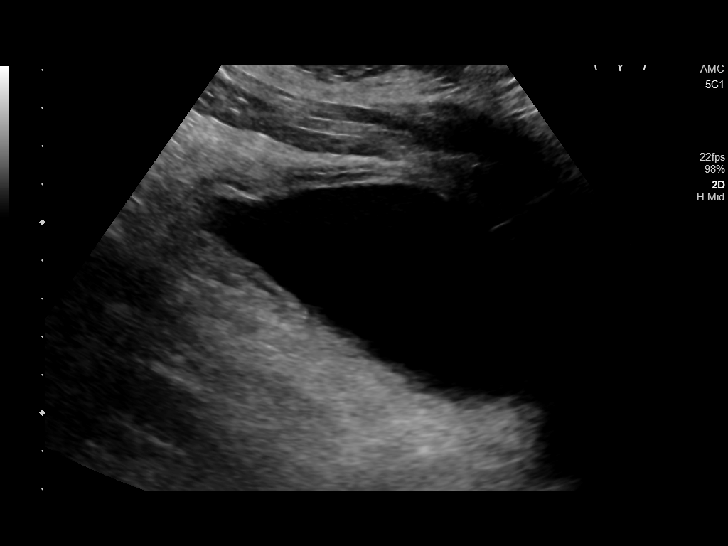

[14 of 25 positions shown; findings below may reference images not displayed]

FINDINGS: Evaluation is limited due to body habitus.

Right Kidney:

Renal measurements: 12.3 x 5.3 x 5.7 cm = volume: 197 mL. There is
diffuse increased renal echogenicity. No hydronephrosis or shadowing
stone.

Left Kidney:

Renal measurements: 13.3 x 7.6 x 2.7 cm = volume: 299 mL. There is
diffuse increased renal echogenicity. No hydronephrosis or shadowing
stone. There is a 2.5 x 2.4 x 3.0 cm lateral interpolar cyst and a
2.8 x 1.9 x 1.8 cm medial upper pole cyst.

Bladder:

Appears normal for degree of bladder distention.
IMPRESSION: 1. Echogenic kidneys in keeping with medical renal disease.
2. No hydronephrosis or shadowing stone.

## 2019-03-31 NOTE — Progress Notes (Signed)
[facility]  ROUNDING NOTE   Subjective:  Urine output was only 750 cc despite Lasix drip. BUN currently 45 with a creatinine up to 5.16 and EGFR of 13. Patient sitting up in bed at the moment.   Objective:  Vital signs in last 24 hours:  Temp:  [97.4 F (36.3 C)-98.6 F (37 C)] 98.6 F (37 C) (07/25 0800) Pulse Rate:  [68-84] 84 (07/25 0800) Resp:  [6-26] 15 (07/25 0800) BP: (150-189)/(84-124) 150/107 (07/25 0800) SpO2:  [93 %-100 %] 96 % (07/25 0800) Weight:  [140.6 kg] 140.6 kg (07/25 0500)  Weight change: -1.4 kg Filed Weights   03/29/19 0543 03/30/19 0500 03/31/19 0500  Weight: (!) 139.9 kg (!) 142 kg (!) 140.6 kg    Intake/Output: I/O last 3 completed shifts: In: 136.3 [I.V.:136.3] Out: 1050 [Urine:1050]   Intake/Output this shift:  Total I/O In: 180 [P.O.:180] Out: 600 [Urine:600]  Physical Exam: General: No acute distress  Head: Normocephalic, atraumatic. Moist oral mucosal membranes  Eyes: Anicteric  Neck: Supple, trachea midline  Lungs:  Clear to auscultation, normal effort  Heart: S1S2 no rubs  Abdomen:  Soft, nontender, bowel sounds present  Extremities: 3+ brawny peripheral edema.  Neurologic: Awake, alert, following commands  Skin: No lesions       Basic Metabolic Panel: Recent Labs  Lab 03/29/19 0128 03/30/19 0456 03/30/19 1511 03/31/19 0616  NA 139 136  --  135  K 3.1* 3.5  --  3.8  CL 101 102  --  101  CO2 28 25  --  24  GLUCOSE 62* 133*  --  128*  BUN 41* 40*  --  45*  CREATININE 5.14* 4.70*  --  5.16*  CALCIUM 8.5* 8.4*  --  8.7*  MG  --   --   --  2.1  PHOS  --   --  5.7* 6.4*    Liver Function Tests: Recent Labs  Lab 03/30/19 0456 03/31/19 0616  AST 23 19  ALT 24 23  ALKPHOS 85 86  BILITOT 0.5 0.6  PROT 6.8 7.0  ALBUMIN 2.6* 2.6*   No results for input(s): LIPASE, AMYLASE in the last 168 hours. No results for input(s): AMMONIA in the last 168 hours.  CBC: Recent Labs  Lab 03/29/19 0128  03/30/19 0456 03/31/19 0616  WBC 7.8 5.5 5.3  HGB 7.9* 7.6* 7.8*  HCT 26.6* 25.6* 25.8*  MCV 82.9 81.0 80.4  PLT 362 332 357    Cardiac Enzymes: No results for input(s): CKTOTAL, CKMB, CKMBINDEX, TROPONINI in the last 168 hours.  BNP: Invalid input(s): POCBNP  CBG: Recent Labs  Lab 03/30/19 0752 03/30/19 1158 03/30/19 1619 03/30/19 2219 03/31/19 0732  GLUCAP 117* 132* 146* 134* 123*    Microbiology: Results for orders placed or performed during the hospital encounter of 03/29/19  SARS Coronavirus 2 (CEPHEID - Performed in [place] hospital lab), Hosp Order     Status: None   Collection Time: 03/29/19  3:08 AM   Specimen: Nasopharyngeal Swab  Result Value Ref Range Status   SARS Coronavirus 2 NEGATIVE NEGATIVE Final    Comment: (NOTE) If result is NEGATIVE SARS-CoV-2 target nucleic acids are NOT DETECTED. The SARS-CoV-2 RNA is generally detectable in upper and lower  respiratory specimens during the acute phase of infection. The lowest  concentration of SARS-CoV-2 viral copies this assay can detect is 250  copies / mL. A negative result does not preclude SARS-CoV-2 infection  and should not be used as the  sole basis for treatment or other  patient management decisions.  A negative result may occur with  improper specimen collection / handling, submission of specimen other  than nasopharyngeal swab, presence of viral mutation(s) within the  areas targeted by this assay, and inadequate number of viral copies  (<250 copies / mL). A negative result must be combined with clinical  observations, patient history, and epidemiological information. If result is POSITIVE SARS-CoV-2 target nucleic acids are DETECTED. The SARS-CoV-2 RNA is generally detectable in upper and lower  respiratory specimens dur ing the acute phase of infection.  Positive  results are indicative of active infection with SARS-CoV-2.  Clinical  correlation with patient history and other diagnostic  information is  necessary to determine patient infection status.  Positive results do  not rule out bacterial infection or co-infection with other viruses. If result is PRESUMPTIVE POSTIVE SARS-CoV-2 nucleic acids MAY BE PRESENT.   A presumptive positive result was obtained on the submitted specimen  and confirmed on repeat testing.  While 2019 novel coronavirus  (SARS-CoV-2) nucleic acids may be present in the submitted sample  additional confirmatory testing may be necessary for epidemiological  and / or clinical management purposes  to differentiate between  SARS-CoV-2 and other Sarbecovirus currently known to infect humans.  If clinically indicated additional testing with an alternate test  methodology [phone]) is advised. The SARS-CoV-2 RNA is generally  detectable in upper and lower respiratory sp ecimens during the acute  phase of infection. The expected result is Negative. Fact Sheet for Patients:  StrictlyIdeas.no Fact Sheet for Healthcare Providers: BankingDealers.co.za This test is not yet approved or cleared by the [place] FDA and has been authorized for detection and/or diagnosis of SARS-CoV-2 by FDA under an Emergency Use Authorization (EUA).  This EUA will remain in effect (meaning this test can be used) for the duration of the COVID-19 declaration under Section 564(b)(1) of the Act, 21 U.S.C. section 360bbb-3(b)(1), unless the authorization is terminated or revoked sooner. Performed at [facility], [address] [place], [place] [address]   MRSA PCR Screening     Status: None   Collection Time: 03/29/19  5:45 AM   Specimen: Nasal Mucosa; Nasopharyngeal  Result Value Ref Range Status   MRSA by PCR NEGATIVE NEGATIVE Final    Comment:        The GeneXpert MRSA Assay (FDA approved for NASAL specimens only), is one component of a comprehensive MRSA colonization surveillance program. It is  not intended to diagnose MRSA infection nor to guide or monitor treatment for MRSA infections. Performed at [facility], [address] [place], [place] [address]     Coagulation Studies: No results for input(s): LABPROT, INR in the last 72 hours.  Urinalysis: No results for input(s): COLORURINE, LABSPEC, PHURINE, GLUCOSEU, HGBUR, BILIRUBINUR, KETONESUR, PROTEINUR, UROBILINOGEN, NITRITE, LEUKOCYTESUR in the last 72 hours.  Invalid input(s): APPERANCEUR    Imaging: US Renal  Result Date: 03/31/2019 CLINICAL DATA:  58 year old male with acute renal failure. History of diabetes. EXAM: RENAL / URINARY TRACT ULTRASOUND COMPLETE COMPARISON:  None. FINDINGS: Evaluation is limited due to body habitus. Right Kidney: Renal measurements: 12.3 x 5.3 x 5.7 cm = volume: 197 mL. There is diffuse increased renal echogenicity. No hydronephrosis or shadowing stone. Left Kidney: Renal measurements: 13.3 x 7.6 x 2.7 cm = volume: 299 mL. There is diffuse increased renal echogenicity. No hydronephrosis or shadowing stone. There is a 2.5 x 2.4 x 3.0 cm lateral interpolar cyst and  a 2.8 x 1.9 x 1.8 cm medial upper pole cyst. Bladder: Appears normal for degree of bladder distention. IMPRESSION: 1. Echogenic kidneys in keeping with medical renal disease. 2. No hydronephrosis or shadowing stone. Electronically Signed   By: Anner Crete M.D.   On: 03/31/2019 02:50     Medications:   . furosemide (LASIX) infusion 8 mg/hr (03/30/19 1911)   . alum & mag hydroxide-simeth  30 mL Oral Once   And  . lidocaine  15 mL Oral Once  . amLODipine  5 mg Oral Daily  . aspirin EC  81 mg Oral Daily  . Chlorhexidine Gluconate Cloth  6 each Topical Q0600  . docusate sodium  100 mg Oral BID  . heparin  5,000 Units Subcutaneous Q8H  . insulin aspart  0-5 Units Subcutaneous QHS  . insulin aspart  0-9 Units Subcutaneous TID WC  . labetalol  200 mg Oral BID  . metoprolol tartrate  25 mg Oral BID  . pantoprazole  (PROTONIX) IV  40 mg Intravenous Q12H  . sertraline  100 mg Oral Daily   acetaminophen **OR** acetaminophen, hydrALAZINE, HYDROcodone-acetaminophen, labetalol, ondansetron **OR** ondansetron (ZOFRAN) IV  Assessment/ Plan:  58 y.o. male with a PMHx of anxiety, chronic back pain, diabetes mellitus type 2, hypertension, morbid obesity, medication nonadherence, chronic kidney disease stage III baseline creatinine 2.0, anemia of chronic kidney disease, secondary hyperparathyroidism who was admitted to [facility] on 03/29/2019 for evaluation of shortness of breath.   1.  Acute renal failure/chronic kidney disease stage III baseline creatinine 2.02 previously followed by [facility] nephrology, but lost to follow up. He has now experienced acute renal failure which we suspect may be related to malignant hypertension.   -Patient's renal function not significantly improved.  EGFR remains low at 13.  Urine output was only 750 cc.  We had a long discussion today about the potential for renal placement therapy.  Patient is open to this possibility if deemed necessary.  Continue Lasix for another 24 hours.  2.  Acute on chronic diastolic heart failure.  Previously the patient had low ejection fraction however repeat 2D echocardiogram showed normal EF.  Patient remains significantly volume overloaded.  Continue Lasix drip.  3.  Anemia chronic kidney disease.  Awaiting SPEP, UPEP, serum iron studies.  Hold off on Epogen at this time.  4.  Secondary hyperparathyroidism.    PTH 190.  Hold off on calcitriol at the moment.  5.  Hypertension.  Patient currently on amlodipine, labetalol, and metoprolol.  Unclear if he needs to beta-blockers but defer to cardiology as well as primary team.   LOS: 2 Ervine Witucki 7/25/202011:48 AM

## 2019-03-31 NOTE — Progress Notes (Signed)
Pt transferred to room 154.  Pt is alert and oriented with no complaints of pain.  Latest vital signs in chart.

## 2019-03-31 NOTE — Progress Notes (Signed)
Patient is able to stand and pivot to [facility].  Patient is able to use the urinal himself.  He is alert x 4.  Patient's skin is dry and crunchy on his legs.  Patient's heart rate and vitals have been stable.  Phillis Knack, RN

## 2019-03-31 NOTE — Progress Notes (Signed)
[facility] [facility] [place] NAME: Steve Vance    MR#:  [id number]  DATE OF BIRTH:  09-11-1960  SUBJECTIVE:   Patient presented to the hospital due to shortness of breath and lower extremity swelling and noted to be in acute CHF.  Patient also noted to have significant accelerated hypertension which is also improved and patient is off Cardene drip now.  Renal function about the same, remains on a Lasix drip  REVIEW OF SYSTEMS:    Review of Systems  Constitutional: Negative for chills and fever.  HENT: Negative for congestion and tinnitus.   Eyes: Negative for blurred vision and double vision.  Respiratory: Positive for shortness of breath. Negative for cough and wheezing.   Cardiovascular: Positive for leg swelling. Negative for chest pain, orthopnea and PND.  Gastrointestinal: Negative for abdominal pain, diarrhea, nausea and vomiting.  Genitourinary: Negative for dysuria and hematuria.  Neurological: Negative for dizziness, sensory change and focal weakness.  All other systems reviewed and are negative.   Nutrition: Renal/Carb modified Tolerating Diet: Yes Tolerating PT: Await Eval.   DRUG ALLERGIES:   Allergies  Allergen Reactions  . Percocet [Oxycodone-Acetaminophen] Nausea And Vomiting    VITALS:  Blood pressure (!) 150/107, pulse 84, temperature 98.6 F (37 C), temperature source Axillary, resp. rate 15, height $RemoveBe'5\' 11"'oHvVwXcLq$  (1.803 m), weight (!) 140.6 kg, SpO2 96 %.  PHYSICAL EXAMINATION:   Physical Exam  GENERAL:  58 y.o.-year-old obese patient sitting up in bed in no acute distress.  EYES: Pupils equal, round, reactive to light and accommodation. No scleral icterus. Extraocular muscles intact.  HEENT: Head atraumatic, normocephalic. Oropharynx and nasopharynx clear.  NECK:  Supple, no jugular venous distention. No thyroid enlargement, no tenderness.  LUNGS: Normal breath sounds bilaterally, no wheezing, rales, rhonchi. No use  of accessory muscles of respiration.  CARDIOVASCULAR: S1, S2 normal. No murmurs, rubs, or gallops.  ABDOMEN: Soft, nontender, nondistended. Bowel sounds present. No organomegaly or mass.  EXTREMITIES: No cyanosis, clubbing, +2-3 edema b/l NEUROLOGIC: Cranial nerves II through XII are intact. No focal Motor or sensory deficits b/l. Globally weak.    PSYCHIATRIC: The patient is alert and oriented x 3.  SKIN: No obvious rash, lesion, or ulcer.    LABORATORY PANEL:   CBC Recent Labs  Lab 03/31/19 0616  WBC 5.3  HGB 7.8*  HCT 25.8*  PLT 357   ------------------------------------------------------------------------------------------------------------------  Chemistries  Recent Labs  Lab 03/31/19 0616  NA 135  K 3.8  CL 101  CO2 24  GLUCOSE 128*  BUN 45*  CREATININE 5.16*  CALCIUM 8.7*  MG 2.1  AST 19  ALT 23  ALKPHOS 86  BILITOT 0.6   ------------------------------------------------------------------------------------------------------------------  Cardiac Enzymes No results for input(s): TROPONINI in the last 168 hours. ------------------------------------------------------------------------------------------------------------------  RADIOLOGY:  US Renal  Result Date: 03/31/2019 CLINICAL DATA:  58 year old male with acute renal failure. History of diabetes. EXAM: RENAL / URINARY TRACT ULTRASOUND COMPLETE COMPARISON:  None. FINDINGS: Evaluation is limited due to body habitus. Right Kidney: Renal measurements: 12.3 x 5.3 x 5.7 cm = volume: 197 mL. There is diffuse increased renal echogenicity. No hydronephrosis or shadowing stone. Left Kidney: Renal measurements: 13.3 x 7.6 x 2.7 cm = volume: 299 mL. There is diffuse increased renal echogenicity. No hydronephrosis or shadowing stone. There is a 2.5 x 2.4 x 3.0 cm lateral interpolar cyst and a 2.8 x 1.9 x 1.8 cm medial upper pole cyst. Bladder: Appears normal for degree of  bladder distention. IMPRESSION: 1. Echogenic kidneys  in keeping with medical renal disease. 2. No hydronephrosis or shadowing stone. Electronically Signed   By: Anner Crete M.D.   On: 03/31/2019 02:50     ASSESSMENT AND PLAN:   58 year old male with past medical history of hypertension, diabetes, morbid obesity, chronic back pain, anxiety, medical noncompliance who presented to the hospital due to shortness of breath, worsening lower extremity edema and also noted to be in acute renal failure.  1.  Accelerated hypertension/hypertensive urgency- patient presented to the hospital with systolic blood pressures over 200.  Initially was on a nicardipine drip but now has been weaned off of it. - Blood pressures have improved.  Continue Norvasc, labetalol, Lopressor.  2.  Acute on chronic renal failure- patient's baseline creatinines around 2 and patient presented to the hospital with a creatinine over 5.  Patient is followed by [facility] nephrology but has not seen them in quite a while. - Currently on a Lasix drip and urine output is fair, nephrology is following and is considering dialysis but no acute need presently. - Serologic work-up has been initiated.  No evidence of obstruction as per renal ultrasound. -Patient likely has CKD secondary to uncontrolled hypertension.  3.  CHF-acute on chronic diastolic dysfunction. - Presently on a Lasix drip, follow I's and O's and daily weights -Continue metoprolol.  4.  Depression-continue Zoloft.  5.  Diabetes type 2 with CKD stage III-continue sliding scale insulin.  Follow blood sugars.   All the records are reviewed and case discussed with Care Management/Social Worker. Management plans discussed with the patient, family and they are in agreement.  CODE STATUS: Full code  DVT Prophylaxis: Hep. SQ  TOTAL TIME TAKING CARE OF THIS PATIENT: 30 minutes.   POSSIBLE D/C IN 2-3 DAYS, DEPENDING ON CLINICAL CONDITION.   Henreitta Leber M.D on 03/31/2019 at 2:50 PM  Between 7am to 6pm - Pager -  [phone]  After 6pm go to [email or web address] - [facility]  Office  [phone]  CC: Primary care physician; [place]
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
## 2019-04-01 LAB — GLUCOSE, CAPILLARY
Glucose-Capillary: 109 mg/dL — ABNORMAL HIGH (ref 70–99)
Glucose-Capillary: 125 mg/dL — ABNORMAL HIGH (ref 70–99)
Glucose-Capillary: 149 mg/dL — ABNORMAL HIGH (ref 70–99)
Glucose-Capillary: 133 mg/dL — ABNORMAL HIGH (ref 70–99)

## 2019-04-01 MED ORDER — LORAZEPAM 2 MG PO TABS
2.0000 mg | ORAL_TABLET | Freq: Every day | ORAL | Status: DC
  Administered 2019-04-01 – 2019-04-05 (×5): 2 mg via ORAL
  Filled 2019-04-01 (×5): qty 1

## 2019-04-01 MED ORDER — NITROGLYCERIN 2 % TD OINT
0.5000 [in_us] | TOPICAL_OINTMENT | Freq: Once | TRANSDERMAL | Status: AC
  Administered 2019-04-01: 03:00:00 0.5 [in_us] via TOPICAL
  Filled 2019-04-01: qty 1

## 2019-04-01 MED ORDER — CLONIDINE HCL 0.1 MG PO TABS
0.2000 mg | ORAL_TABLET | ORAL | Status: AC
  Administered 2019-04-01: 0.2 mg via ORAL
  Filled 2019-04-01: qty 2

## 2019-04-01 NOTE — Plan of Care (Signed)
Patient is pleasant and calm needs assistance with moving in bed and getting into bed. Legs are very swollen and he has difficulty lifting them. B/P has been elevated, PRN medication given. Patient asks questions about his medication and situation, unclear how much he understands.    Problem: Education: Goal: Knowledge of General Education information will improve Description: Including pain rating scale, medication(s)/side effects and non-pharmacologic comfort measures Outcome: Progressing   Problem: Health Behavior/Discharge Planning: Goal: Ability to manage health-related needs will improve Outcome: Progressing   Problem: Clinical Measurements: Goal: Ability to maintain clinical measurements within normal limits will improve Outcome: Progressing Goal: Will remain free from infection Outcome: Progressing Goal: Diagnostic test results will improve Outcome: Progressing Goal: Respiratory complications will improve Outcome: Progressing Goal: Cardiovascular complication will be avoided Outcome: Progressing   Problem: Activity: Goal: Risk for activity intolerance will decrease Outcome: Progressing   Problem: Nutrition: Goal: Adequate nutrition will be maintained Outcome: Progressing   Problem: Coping: Goal: Level of anxiety will decrease Outcome: Progressing   Problem: Elimination: Goal: Will not experience complications related to bowel motility Outcome: Progressing Goal: Will not experience complications related to urinary retention Outcome: Progressing   Problem: Pain Managment: Goal: General experience of comfort will improve Outcome: Progressing   Problem: Safety: Goal: Ability to remain free from injury will improve Outcome: Progressing   Problem: Skin Integrity: Goal: Risk for impaired skin integrity will decrease Outcome: Progressing

## 2019-04-01 NOTE — Progress Notes (Signed)
[facility]  ROUNDING NOTE   Subjective:  Patient seen at bedside.  Renal function worse. EGFR 13. Discussed the potential for renal replacement therapy. Patient agreeable to proceeding. Urine output was 1.5 L over the preceding 24 hours.  Objective:  Vital signs in last 24 hours:  Temp:  [97.7 F (36.5 C)-98.2 F (36.8 C)] 97.7 F (36.5 C) (07/26 1142) Pulse Rate:  [52-108] 102 (07/26 1142) Resp:  [12-24] 24 (07/25 2010) BP: (145-178)/(87-125) 149/87 (07/26 1142) SpO2:  [94 %-100 %] 99 % (07/26 1142)  Weight change:  Filed Weights   03/29/19 0543 03/30/19 0500 03/31/19 0500  Weight: (!) 139.9 kg (!) 142 kg (!) 140.6 kg    Intake/Output: I/O last 3 completed shifts: In: 559.7 [P.O.:380; I.V.:179.7] Out: 1950 [Urine:1950]   Intake/Output this shift:  Total I/O In: 167.3 [I.V.:167.3] Out: 950 [Urine:950]  Physical Exam: General: No acute distress  Head: Normocephalic, atraumatic. Moist oral mucosal membranes  Eyes: Anicteric  Neck: Supple, trachea midline  Lungs:  Clear to auscultation, normal effort  Heart: S1S2 no rubs  Abdomen:  Soft, nontender, bowel sounds present  Extremities: 3+ brawny peripheral edema.  Neurologic: Awake, alert, following commands  Skin: No lesions       Basic Metabolic Panel: Recent Labs  Lab 03/29/19 0128 03/30/19 0456 03/30/19 1511 03/31/19 0616  NA 139 136  --  135  K 3.1* 3.5  --  3.8  CL 101 102  --  101  CO2 28 25  --  24  GLUCOSE 62* 133*  --  128*  BUN 41* 40*  --  45*  CREATININE 5.14* 4.70*  --  5.16*  CALCIUM 8.5* 8.4*  --  8.7*  MG  --   --   --  2.1  PHOS  --   --  5.7* 6.4*    Liver Function Tests: Recent Labs  Lab 03/30/19 0456 03/31/19 0616  AST 23 19  ALT 24 23  ALKPHOS 85 86  BILITOT 0.5 0.6  PROT 6.8 7.0  ALBUMIN 2.6* 2.6*   No results for input(s): LIPASE, AMYLASE in the last 168 hours. No results for input(s): AMMONIA in the last 168 hours.  CBC: Recent Labs  Lab  03/29/19 0128 03/30/19 0456 03/31/19 0616  WBC 7.8 5.5 5.3  HGB 7.9* 7.6* 7.8*  HCT 26.6* 25.6* 25.8*  MCV 82.9 81.0 80.4  PLT 362 332 357    Cardiac Enzymes: No results for input(s): CKTOTAL, CKMB, CKMBINDEX, TROPONINI in the last 168 hours.  BNP: Invalid input(s): POCBNP  CBG: Recent Labs  Lab 03/31/19 1235 03/31/19 1616 03/31/19 2131 04/01/19 0739 04/01/19 1146  GLUCAP 142* 128* 145* 109* 125*    Microbiology: Results for orders placed or performed during the hospital encounter of 03/29/19  SARS Coronavirus 2 (CEPHEID - Performed in [place] hospital lab), Hosp Order     Status: None   Collection Time: 03/29/19  3:08 AM   Specimen: Nasopharyngeal Swab  Result Value Ref Range Status   SARS Coronavirus 2 NEGATIVE NEGATIVE Final    Comment: (NOTE) If result is NEGATIVE SARS-CoV-2 target nucleic acids are NOT DETECTED. The SARS-CoV-2 RNA is generally detectable in upper and lower  respiratory specimens during the acute phase of infection. The lowest  concentration of SARS-CoV-2 viral copies this assay can detect is 250  copies / mL. A negative result does not preclude SARS-CoV-2 infection  and should not be used as the sole basis for treatment or other  patient  management decisions.  A negative result may occur with  improper specimen collection / handling, submission of specimen other  than nasopharyngeal swab, presence of viral mutation(s) within the  areas targeted by this assay, and inadequate number of viral copies  (<250 copies / mL). A negative result must be combined with clinical  observations, patient history, and epidemiological information. If result is POSITIVE SARS-CoV-2 target nucleic acids are DETECTED. The SARS-CoV-2 RNA is generally detectable in upper and lower  respiratory specimens dur ing the acute phase of infection.  Positive  results are indicative of active infection with SARS-CoV-2.  Clinical  correlation with patient history and  other diagnostic information is  necessary to determine patient infection status.  Positive results do  not rule out bacterial infection or co-infection with other viruses. If result is PRESUMPTIVE POSTIVE SARS-CoV-2 nucleic acids MAY BE PRESENT.   A presumptive positive result was obtained on the submitted specimen  and confirmed on repeat testing.  While 2019 novel coronavirus  (SARS-CoV-2) nucleic acids may be present in the submitted sample  additional confirmatory testing may be necessary for epidemiological  and / or clinical management purposes  to differentiate between  SARS-CoV-2 and other Sarbecovirus currently known to infect humans.  If clinically indicated additional testing with an alternate test  methodology [phone]) is advised. The SARS-CoV-2 RNA is generally  detectable in upper and lower respiratory sp ecimens during the acute  phase of infection. The expected result is Negative. Fact Sheet for Patients:  StrictlyIdeas.no Fact Sheet for Healthcare Providers: BankingDealers.co.za This test is not yet approved or cleared by the [place] FDA and has been authorized for detection and/or diagnosis of SARS-CoV-2 by FDA under an Emergency Use Authorization (EUA).  This EUA will remain in effect (meaning this test can be used) for the duration of the COVID-19 declaration under Section 564(b)(1) of the Act, 21 U.S.C. section 360bbb-3(b)(1), unless the authorization is terminated or revoked sooner. Performed at [facility], [address] [place], [place] [address]   MRSA PCR Screening     Status: None   Collection Time: 03/29/19  5:45 AM   Specimen: Nasal Mucosa; Nasopharyngeal  Result Value Ref Range Status   MRSA by PCR NEGATIVE NEGATIVE Final    Comment:        The GeneXpert MRSA Assay (FDA approved for NASAL specimens only), is one component of a comprehensive MRSA colonization surveillance program.  It is not intended to diagnose MRSA infection nor to guide or monitor treatment for MRSA infections. Performed at [facility], [address] [place], [place] [address]     Coagulation Studies: No results for input(s): LABPROT, INR in the last 72 hours.  Urinalysis: No results for input(s): COLORURINE, LABSPEC, PHURINE, GLUCOSEU, HGBUR, BILIRUBINUR, KETONESUR, PROTEINUR, UROBILINOGEN, NITRITE, LEUKOCYTESUR in the last 72 hours.  Invalid input(s): APPERANCEUR    Imaging: US Renal  Result Date: 03/31/2019 CLINICAL DATA:  58 year old male with acute renal failure. History of diabetes. EXAM: RENAL / URINARY TRACT ULTRASOUND COMPLETE COMPARISON:  None. FINDINGS: Evaluation is limited due to body habitus. Right Kidney: Renal measurements: 12.3 x 5.3 x 5.7 cm = volume: 197 mL. There is diffuse increased renal echogenicity. No hydronephrosis or shadowing stone. Left Kidney: Renal measurements: 13.3 x 7.6 x 2.7 cm = volume: 299 mL. There is diffuse increased renal echogenicity. No hydronephrosis or shadowing stone. There is a 2.5 x 2.4 x 3.0 cm lateral interpolar cyst and a 2.8 x 1.9 x 1.8 cm medial  upper pole cyst. Bladder: Appears normal for degree of bladder distention. IMPRESSION: 1. Echogenic kidneys in keeping with medical renal disease. 2. No hydronephrosis or shadowing stone. Electronically Signed   By: Anner Crete M.D.   On: 03/31/2019 02:50     Medications:   . furosemide (LASIX) infusion 8 mg/hr (03/31/19 2133)   . alum & mag hydroxide-simeth  30 mL Oral Once   And  . lidocaine  15 mL Oral Once  . amLODipine  5 mg Oral Daily  . aspirin EC  81 mg Oral Daily  . Chlorhexidine Gluconate Cloth  6 each Topical Q0600  . docusate sodium  100 mg Oral BID  . heparin  5,000 Units Subcutaneous Q8H  . insulin aspart  0-5 Units Subcutaneous QHS  . insulin aspart  0-9 Units Subcutaneous TID WC  . labetalol  200 mg Oral BID  . metoprolol tartrate  25 mg Oral BID  .  pantoprazole (PROTONIX) IV  40 mg Intravenous Q12H  . sertraline  100 mg Oral Daily   acetaminophen **OR** acetaminophen, hydrALAZINE, HYDROcodone-acetaminophen, labetalol, ondansetron **OR** ondansetron (ZOFRAN) IV  Assessment/ Plan:  58 y.o. male with a PMHx of anxiety, chronic back pain, diabetes mellitus type 2, hypertension, morbid obesity, medication nonadherence, chronic kidney disease stage III baseline creatinine 2.0, anemia of chronic kidney disease, secondary hyperparathyroidism who was admitted to [facility] on 03/29/2019 for evaluation of shortness of breath.   1.  Acute renal failure/chronic kidney disease stage III baseline creatinine 2.02 previously followed by [facility] nephrology, but lost to follow up. He has now experienced acute renal failure which we suspect may be related to malignant hypertension.   -Patient remains significantly volume overloaded.  He may have experienced progressive renal dysfunction.  EGFR currently 13.  We will proceed with initiation of renal replacement therapy.  PermCath to be placed tomorrow.  2.  Acute on chronic diastolic heart failure.  Previously the patient had low ejection fraction however repeat 2D echocardiogram showed normal EF.  Continue Lasix drip for now.  Patient to be started on dialysis.  3.  Anemia chronic kidney disease.  Awaiting SPEP, UPEP.  Low iron noted.  Hold off on Epogen at this time.  4.  Secondary hyperparathyroidism.    PTH 190.  Hold off on calcitriol at the moment.  5.  Hypertension.  Continue amlodipine, labetalol.  Consider adding ARB once the patient is on dialysis.   LOS: 3 Steve Vance 7/26/20203:39 PM

## 2019-04-01 NOTE — Progress Notes (Addendum)
[place] at [place] NAME: Steve Vance    MR#:  [id number]  DATE OF BIRTH:  [birth year]  SUBJECTIVE:   Patient remains on Lasix drip and urine output is marginal.  Patient did not sleep well last night.  Denies any worsening shortness of breath, chest pains or any other associated symptoms.  REVIEW OF SYSTEMS:    Review of Systems  Constitutional: Negative for chills and fever.  HENT: Negative for congestion and tinnitus.   Eyes: Negative for blurred vision and double vision.  Respiratory: Negative for cough, shortness of breath and wheezing.   Cardiovascular: Positive for leg swelling. Negative for chest pain, orthopnea and PND.  Gastrointestinal: Negative for abdominal pain, diarrhea, nausea and vomiting.  Genitourinary: Negative for dysuria and hematuria.  Neurological: Negative for dizziness, sensory change and focal weakness.  All other systems reviewed and are negative.   Nutrition: Renal/Carb modified Tolerating Diet: Yes Tolerating PT: Await Eval.   DRUG ALLERGIES:   Allergies  Allergen Reactions  . Percocet [Oxycodone-Acetaminophen] Nausea And Vomiting    VITALS:  Blood pressure (!) 149/87, pulse (!) 102, temperature 97.7 F (36.5 C), temperature source Oral, resp. rate (!) 24, height $RemoveBe'5\' 11"'XmDzXEwFr$  (1.803 m), weight (!) 140.6 kg, SpO2 99 %.  PHYSICAL EXAMINATION:   Physical Exam  GENERAL:  58 y.o.-year-old obese patient lying in bed in no acute distress.  EYES: Pupils equal, round, reactive to light and accommodation. No scleral icterus. Extraocular muscles intact.  HEENT: Head atraumatic, normocephalic. Oropharynx and nasopharynx clear.  NECK:  Supple, no jugular venous distention. No thyroid enlargement, no tenderness.  LUNGS: Normal breath sounds bilaterally, no wheezing, rales, rhonchi. No use of accessory muscles of respiration.  CARDIOVASCULAR: S1, S2 normal. No murmurs, rubs, or gallops.  ABDOMEN: Soft,  nontender, nondistended. Bowel sounds present. No organomegaly or mass.  EXTREMITIES: No cyanosis, clubbing, +2-3 edema b/l. Signs of chronic venous stasis b/l.   NEUROLOGIC: Cranial nerves II through XII are intact. No focal Motor or sensory deficits b/l. Globally weak.    PSYCHIATRIC: The patient is alert and oriented x 3.  SKIN: No obvious rash, lesion, or ulcer.    LABORATORY PANEL:   CBC Recent Labs  Lab 03/31/19 0616  WBC 5.3  HGB 7.8*  HCT 25.8*  PLT 357   ------------------------------------------------------------------------------------------------------------------  Chemistries  Recent Labs  Lab 03/31/19 0616  NA 135  K 3.8  CL 101  CO2 24  GLUCOSE 128*  BUN 45*  CREATININE 5.16*  CALCIUM 8.7*  MG 2.1  AST 19  ALT 23  ALKPHOS 86  BILITOT 0.6   ------------------------------------------------------------------------------------------------------------------  Cardiac Enzymes No results for input(s): TROPONINI in the last 168 hours. ------------------------------------------------------------------------------------------------------------------  RADIOLOGY:  US Renal  Result Date: 03/31/2019 CLINICAL DATA:  58 year old male with acute renal failure. History of diabetes. EXAM: RENAL / URINARY TRACT ULTRASOUND COMPLETE COMPARISON:  None. FINDINGS: Evaluation is limited due to body habitus. Right Kidney: Renal measurements: 12.3 x 5.3 x 5.7 cm = volume: 197 mL. There is diffuse increased renal echogenicity. No hydronephrosis or shadowing stone. Left Kidney: Renal measurements: 13.3 x 7.6 x 2.7 cm = volume: 299 mL. There is diffuse increased renal echogenicity. No hydronephrosis or shadowing stone. There is a 2.5 x 2.4 x 3.0 cm lateral interpolar cyst and a 2.8 x 1.9 x 1.8 cm medial upper pole cyst. Bladder: Appears normal for degree of bladder distention. IMPRESSION: 1. Echogenic kidneys in keeping with medical renal disease. 2.  No hydronephrosis or shadowing  stone. Electronically Signed   By: Anner Crete M.D.   On: 03/31/2019 02:50     ASSESSMENT AND PLAN:   58 year old male with past medical history of hypertension, diabetes, morbid obesity, chronic back pain, anxiety, medical noncompliance who presented to the hospital due to shortness of breath, worsening lower extremity edema and also noted to be in acute renal failure.  1.  Accelerated hypertension/hypertensive urgency- patient presented to the hospital with systolic blood pressures over 200.  Initially was on a nicardipine drip but now has been weaned off of it. - Blood pressures have improved.  Continue Norvasc, labetalol, Lopressor. - cont. PRN Hydralazine  2.  Acute on chronic renal failure- patient's baseline creatinines around 2 and patient presented to the hospital with a creatinine over 5.  Patient is followed by [facility] nephrology but has not seen them in quite a while. - Currently on a Lasix drip and urine output is marginal, cussed with nephrology and they plan on initiating hemodialysis next week.  Renal ultrasound was negative for obstruction. -Patient likely has CKD secondary to uncontrolled hypertension.  3.  CHF-acute on chronic diastolic dysfunction. - Presently on a Lasix drip, had 1500 cc of urine output in the last 24 hours.  About 2.5 L negative since admission. -Continue metoprolol.  4.  Depression-continue Zoloft.  5.  Diabetes type 2 with CKD stage III-continue sliding scale insulin.  BS stable.    All the records are reviewed and case discussed with Care Management/Social Worker. Management plans discussed with the patient, family and they are in agreement.  CODE STATUS: Full code  DVT Prophylaxis: Hep. SQ  TOTAL TIME TAKING CARE OF THIS PATIENT: 30 minutes.   POSSIBLE D/C IN 3-4 DAYS, DEPENDING ON CLINICAL CONDITION.   Henreitta Leber M.D on 04/01/2019 at 1:42 PM  Between 7am to 6pm - Pager - [phone]  After 6pm go to [email or web address] - [facility]  Office  [phone]  CC: Primary care physician; [place]

## 2019-04-02 ENCOUNTER — Other Ambulatory Visit: Payer: Medicare Other | Admitting: Pharmacist

## 2019-04-02 ENCOUNTER — Inpatient Hospital Stay
Admission: EM | Disposition: A | Discharge status: Home Health | Payer: Self-pay | Source: Home / Self Care | Attending: Specialist

## 2019-04-02 ENCOUNTER — Encounter
Admission: EM | Disposition: A | Discharge status: Home Health | Payer: Self-pay | Source: Home / Self Care | Attending: Specialist

## 2019-04-02 ENCOUNTER — Encounter: Payer: Self-pay | Admitting: Vascular Surgery

## 2019-04-02 ENCOUNTER — Other Ambulatory Visit (INDEPENDENT_AMBULATORY_CARE_PROVIDER_SITE_OTHER): Payer: Self-pay | Admitting: Vascular Surgery

## 2019-04-02 DIAGNOSIS — I129 Hypertensive chronic kidney disease with stage 1 through stage 4 chronic kidney disease, or unspecified chronic kidney disease: Secondary | ICD-10-CM

## 2019-04-02 DIAGNOSIS — R600 Localized edema: Secondary | ICD-10-CM

## 2019-04-02 DIAGNOSIS — F419 Anxiety disorder, unspecified: Secondary | ICD-10-CM

## 2019-04-02 DIAGNOSIS — M549 Dorsalgia, unspecified: Secondary | ICD-10-CM

## 2019-04-02 DIAGNOSIS — G8929 Other chronic pain: Secondary | ICD-10-CM

## 2019-04-02 DIAGNOSIS — N189 Chronic kidney disease, unspecified: Secondary | ICD-10-CM

## 2019-04-02 DIAGNOSIS — N186 End stage renal disease: Secondary | ICD-10-CM

## 2019-04-02 DIAGNOSIS — E1122 Type 2 diabetes mellitus with diabetic chronic kidney disease: Secondary | ICD-10-CM

## 2019-04-02 HISTORY — PX: DIALYSIS/PERMA CATHETER INSERTION: CATH118288

## 2019-04-02 LAB — CBC
HCT: 23.8 % — ABNORMAL LOW (ref 39.0–52.0)
Hemoglobin: 7.2 g/dL — ABNORMAL LOW (ref 13.0–17.0)
MCH: 24 pg — ABNORMAL LOW (ref 26.0–34.0)
MCHC: 30.3 g/dL (ref 30.0–36.0)
MCV: 79.3 fL — ABNORMAL LOW (ref 80.0–100.0)
Platelets: 312 10*3/uL (ref 150–400)
RBC: 3 MIL/uL — ABNORMAL LOW (ref 4.22–5.81)
RDW: 15.6 % — ABNORMAL HIGH (ref 11.5–15.5)
WBC: 5.3 10*3/uL (ref 4.0–10.5)
nRBC: 0 % (ref 0.0–0.2)

## 2019-04-02 LAB — BASIC METABOLIC PANEL
Anion gap: 10 (ref 5–15)
BUN: 53 mg/dL — ABNORMAL HIGH (ref 6–20)
CO2: 26 mmol/L (ref 22–32)
Calcium: 8.7 mg/dL — ABNORMAL LOW (ref 8.9–10.3)
Chloride: 100 mmol/L (ref 98–111)
Creatinine, Ser: 5.6 mg/dL — ABNORMAL HIGH (ref 0.61–1.24)
GFR calc Af Amer: 12 mL/min — ABNORMAL LOW (ref >60)
GFR calc non Af Amer: 10 mL/min — ABNORMAL LOW (ref >60)
Glucose, Bld: 108 mg/dL — ABNORMAL HIGH (ref 70–99)
Potassium: 3.7 mmol/L (ref 3.5–5.1)
Sodium: 136 mmol/L (ref 135–145)

## 2019-04-02 LAB — PROTEIN ELECTROPHORESIS, SERUM
A/G Ratio: 0.7 (ref 0.7–1.7)
Albumin ELP: 2.6 g/dL — ABNORMAL LOW (ref 2.9–4.4)
Alpha-1-Globulin: 0.3 g/dL (ref 0.0–0.4)
Alpha-2-Globulin: 0.8 g/dL (ref 0.4–1.0)
Beta Globulin: 0.8 g/dL (ref 0.7–1.3)
Gamma Globulin: 1.8 g/dL (ref 0.4–1.8)
Globulin, Total: 3.8 g/dL (ref 2.2–3.9)
Total Protein ELP: 6.4 g/dL (ref 6.0–8.5)

## 2019-04-02 LAB — PROTEIN ELECTRO, RANDOM URINE
Albumin ELP, Urine: 56.2 %
Alpha-1-Globulin, U: 7 %
Alpha-2-Globulin, U: 6.4 %
Beta Globulin, U: 9.7 %
Gamma Globulin, U: 20.7 %
Total Protein, Urine: 484.7 mg/dL

## 2019-04-02 LAB — GLUCOSE, CAPILLARY
Glucose-Capillary: 115 mg/dL — ABNORMAL HIGH (ref 70–99)
Glucose-Capillary: 101 mg/dL — ABNORMAL HIGH (ref 70–99)
Glucose-Capillary: 98 mg/dL (ref 70–99)
Glucose-Capillary: 128 mg/dL — ABNORMAL HIGH (ref 70–99)

## 2019-04-02 LAB — PHOSPHORUS: Phosphorus: 6.2 mg/dL — ABNORMAL HIGH (ref 2.5–4.6)

## 2019-04-02 LAB — GLOMERULAR BASEMENT MEMBRANE ANTIBODIES: GBM Ab: 3 units (ref 0–20)

## 2019-04-02 SURGERY — DIALYSIS/PERMA CATHETER INSERTION
Anesthesia: Moderate Sedation

## 2019-04-02 SURGERY — LOWER EXTREMITY ANGIOGRAPHY
Anesthesia: Moderate Sedation

## 2019-04-02 MED ORDER — SODIUM CHLORIDE 0.9 % IV SOLN: 100.0000 mL | INTRAVENOUS | Status: DC | PRN

## 2019-04-02 MED ORDER — DIPHENHYDRAMINE HCL 50 MG/ML IJ SOLN: 50.0000 mg | Freq: Once | INTRAMUSCULAR | Status: DC | PRN

## 2019-04-02 MED ORDER — FAMOTIDINE 20 MG PO TABS: 40.0000 mg | ORAL_TABLET | Freq: Once | ORAL | Status: DC | PRN

## 2019-04-02 MED ORDER — CEFAZOLIN SODIUM-DEXTROSE 1-4 GM/50ML-% IV SOLN: 1.0000 g | Freq: Once | INTRAVENOUS | Status: DC

## 2019-04-02 MED ORDER — HEPARIN SODIUM (PORCINE) 1000 UNIT/ML DIALYSIS
1000.0000 [IU] | INTRAMUSCULAR | Status: DC | PRN
  Filled 2019-04-02: qty 1

## 2019-04-02 MED ORDER — MIDAZOLAM HCL 5 MG/5ML IJ SOLN
INTRAMUSCULAR | Status: AC
  Filled 2019-04-02: qty 5

## 2019-04-02 MED ORDER — LIDOCAINE-EPINEPHRINE (PF) 1 %-1:200000 IJ SOLN
INTRAMUSCULAR | Status: AC
  Filled 2019-04-02: qty 30

## 2019-04-02 MED ORDER — LIDOCAINE-PRILOCAINE 2.5-2.5 % EX CREA
1.0000 "application " | TOPICAL_CREAM | CUTANEOUS | Status: DC | PRN
  Filled 2019-04-02: qty 5

## 2019-04-02 MED ORDER — MIDAZOLAM HCL 2 MG/ML PO SYRP: 8.0000 mg | ORAL_SOLUTION | Freq: Once | ORAL | Status: DC | PRN

## 2019-04-02 MED ORDER — FENTANYL CITRATE (PF) 100 MCG/2ML IJ SOLN
INTRAMUSCULAR | Status: AC
  Filled 2019-04-02: qty 2

## 2019-04-02 MED ORDER — FENTANYL CITRATE (PF) 100 MCG/2ML IJ SOLN
INTRAMUSCULAR | Status: DC | PRN
  Administered 2019-04-02: 50 ug via INTRAVENOUS

## 2019-04-02 MED ORDER — TUBERCULIN PPD 5 UNIT/0.1ML ID SOLN
5.0000 [IU] | Freq: Once | INTRADERMAL | Status: AC
  Administered 2019-04-02: 5 [IU] via INTRADERMAL
  Filled 2019-04-02: qty 0.1

## 2019-04-02 MED ORDER — METHYLPREDNISOLONE SODIUM SUCC 125 MG IJ SOLR: 125.0000 mg | Freq: Once | INTRAMUSCULAR | Status: DC | PRN

## 2019-04-02 MED ORDER — ONDANSETRON HCL 4 MG/2ML IJ SOLN: 4.0000 mg | Freq: Four times a day (QID) | INTRAMUSCULAR | Status: DC | PRN

## 2019-04-02 MED ORDER — SODIUM CHLORIDE 0.9 % IV SOLN: INTRAVENOUS | Status: DC

## 2019-04-02 MED ORDER — PENTAFLUOROPROP-TETRAFLUOROETH EX AERO
1.0000 "application " | INHALATION_SPRAY | CUTANEOUS | Status: DC | PRN
  Filled 2019-04-02: qty 30

## 2019-04-02 MED ORDER — LIDOCAINE HCL (PF) 1 % IJ SOLN
5.0000 mL | INTRAMUSCULAR | Status: DC | PRN
  Filled 2019-04-02: qty 5

## 2019-04-02 MED ORDER — CEFAZOLIN SODIUM-DEXTROSE 1-4 GM/50ML-% IV SOLN
INTRAVENOUS | Status: AC
  Filled 2019-04-02: qty 50

## 2019-04-02 MED ORDER — ALTEPLASE 2 MG IJ SOLR
2.0000 mg | Freq: Once | INTRAMUSCULAR | Status: DC | PRN
  Filled 2019-04-02: qty 2

## 2019-04-02 MED ORDER — HEPARIN (PORCINE) IN NACL 1000-0.9 UT/500ML-% IV SOLN
INTRAVENOUS | Status: DC | PRN
  Administered 2019-04-02: 500 mL

## 2019-04-02 MED ORDER — HYDROMORPHONE HCL 1 MG/ML IJ SOLN: 1.0000 mg | Freq: Once | INTRAMUSCULAR | Status: DC | PRN

## 2019-04-02 MED ORDER — CHLORHEXIDINE GLUCONATE CLOTH 2 % EX PADS
6.0000 | MEDICATED_PAD | Freq: Every day | CUTANEOUS | Status: DC
  Administered 2019-04-03 – 2019-04-05 (×3): 6 via TOPICAL

## 2019-04-02 MED ORDER — CEFAZOLIN SODIUM-DEXTROSE 1-4 GM/50ML-% IV SOLN
1.0000 g | Freq: Once | INTRAVENOUS | Status: AC
  Administered 2019-04-02: 1 g via INTRAVENOUS

## 2019-04-02 MED ORDER — HEPARIN SODIUM (PORCINE) 10000 UNIT/ML IJ SOLN
INTRAMUSCULAR | Status: AC
  Filled 2019-04-02: qty 1

## 2019-04-02 MED ORDER — LIDOCAINE-EPINEPHRINE (PF) 1 %-1:200000 IJ SOLN
INTRAMUSCULAR | Status: DC | PRN
  Administered 2019-04-02: 10 mL

## 2019-04-02 MED ORDER — MIDAZOLAM HCL 2 MG/2ML IJ SOLN
INTRAMUSCULAR | Status: DC | PRN
  Administered 2019-04-02: 2 mg via INTRAVENOUS

## 2019-04-02 SURGICAL SUPPLY — 9 items
BIOPATCH RED 1 DISK 7.0 (GAUZE/BANDAGES/DRESSINGS) ×1 IMPLANT
BIOPATCH RED 1IN DISK 7.0MM (GAUZE/BANDAGES/DRESSINGS) ×1
CATH CANNON HEMO 15FR 19 (HEMODIALYSIS SUPPLIES) ×2 IMPLANT
COVER PROBE U/S 5X48 (MISCELLANEOUS) ×2 IMPLANT
DERMABOND ADVANCED (GAUZE/BANDAGES/DRESSINGS) ×2
DERMABOND ADVANCED .7 DNX12 (GAUZE/BANDAGES/DRESSINGS) IMPLANT
PACK ANGIOGRAPHY (CUSTOM PROCEDURE TRAY) ×2 IMPLANT
SUT MNCRL AB 4-0 PS2 18 (SUTURE) ×2 IMPLANT
SUT PROLENE 0 CT 1 30 (SUTURE) ×2 IMPLANT

## 2019-04-02 NOTE — Progress Notes (Signed)
[place] at [place] NAME: Steve Vance    MR#:  [id number]  DATE OF BIRTH:  1961/03/09  SUBJECTIVE:   Remains on Lasix drip but plan on having PermCath placement today and starting hemodialysis.  Patient denies any worsening shortness of breath, chest pain or any other associated symptoms.  REVIEW OF SYSTEMS:    Review of Systems  Constitutional: Negative for chills and fever.  HENT: Negative for congestion and tinnitus.   Eyes: Negative for blurred vision and double vision.  Respiratory: Negative for cough, shortness of breath and wheezing.   Cardiovascular: Positive for leg swelling. Negative for chest pain, orthopnea and PND.  Gastrointestinal: Negative for abdominal pain, diarrhea, nausea and vomiting.  Genitourinary: Negative for dysuria and hematuria.  Neurological: Negative for dizziness, sensory change and focal weakness.  All other systems reviewed and are negative.   Nutrition: Renal/Carb modified Tolerating Diet: Yes Tolerating PT: Await Eval.   DRUG ALLERGIES:   Allergies  Allergen Reactions  . Percocet [Oxycodone-Acetaminophen] Nausea And Vomiting    VITALS:  Blood pressure (!) 176/91, pulse 65, temperature 98.6 F (37 C), temperature source Oral, resp. rate 12, height $RemoveBe'5\' 11"'nDaGVMLix$  (1.803 m), weight (!) 139.3 kg, SpO2 95 %.  PHYSICAL EXAMINATION:   Physical Exam  GENERAL:  58 y.o.-year-old obese patient lying in bed in no acute distress.  EYES: Pupils equal, round, reactive to light and accommodation. No scleral icterus. Extraocular muscles intact.  HEENT: Head atraumatic, normocephalic. Oropharynx and nasopharynx clear.  NECK:  Supple, no jugular venous distention. No thyroid enlargement, no tenderness.  LUNGS: Normal breath sounds bilaterally, no wheezing, rales, rhonchi. No use of accessory muscles of respiration.  CARDIOVASCULAR: S1, S2 normal. No murmurs, rubs, or gallops.  ABDOMEN: Soft, nontender,  nondistended. Bowel sounds present. No organomegaly or mass.  EXTREMITIES: No cyanosis, clubbing, +2-3 edema b/l. Signs of chronic venous stasis b/l.   NEUROLOGIC: Cranial nerves II through XII are intact. No focal Motor or sensory deficits b/l. Globally weak.    PSYCHIATRIC: The patient is alert and oriented x 3.  SKIN: No obvious rash, lesion, or ulcer.    LABORATORY PANEL:   CBC Recent Labs  Lab 04/02/19 0447  WBC 5.3  HGB 7.2*  HCT 23.8*  PLT 312   ------------------------------------------------------------------------------------------------------------------  Chemistries  Recent Labs  Lab 03/31/19 0616 04/02/19 0447  NA 135 136  K 3.8 3.7  CL 101 100  CO2 24 26  GLUCOSE 128* 108*  BUN 45* 53*  CREATININE 5.16* 5.60*  CALCIUM 8.7* 8.7*  MG 2.1  --   AST 19  --   ALT 23  --   ALKPHOS 86  --   BILITOT 0.6  --    ------------------------------------------------------------------------------------------------------------------  Cardiac Enzymes No results for input(s): TROPONINI in the last 168 hours. ------------------------------------------------------------------------------------------------------------------  RADIOLOGY:  No results found.   ASSESSMENT AND PLAN:   58 year old male with past medical history of hypertension, diabetes, morbid obesity, chronic back pain, anxiety, medical noncompliance who presented to the hospital due to shortness of breath, worsening lower extremity edema and also noted to be in acute renal failure.  1.  Accelerated hypertension/hypertensive urgency- patient presented to the hospital with systolic blood pressures over 200.  Initially was on a nicardipine drip but now has been weaned off of it. - Blood pressures much improved. .  Continue Norvasc, labetalol, Lopressor. - cont. PRN Hydralazine  2.  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  Patient is followed by [facility] nephrology but has not seen them in quite a while. -Patient's urine output has been marginal despite being on a Lasix drip.  As per nephrology plan for permacath placement and starting hemodialysis today.  Renal ultrasound was negative.  Continue further care as per nephrology.   3.  CHF-acute on chronic diastolic dysfunction. - Presently on a Lasix drip, had 1600 cc of urine output in the last 24 hours.  About 3.7 L negative since admission. -Continue metoprolol.  4.  Depression-continue Zoloft.  5.  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    All the records are reviewed and case discussed with Care Management/Social Worker. Management plans discussed with the patient, family and they are in agreement.  CODE STATUS: Full code  DVT Prophylaxis: Hep. SQ  TOTAL TIME TAKING CARE OF THIS PATIENT: 30 minutes.   POSSIBLE D/C IN 2-3 DAYS, DEPENDING ON CLINICAL CONDITION.   Henreitta Leber M.D on 04/02/2019 at 2:57 PM  Between 7am to 6pm - Pager - [phone]  After 6pm go to [email or web address] - [facility]  Office  [phone]  CC: Primary care physician; [place]

## 2019-04-02 NOTE — Care Management Important Message (Signed)
Important Message  Patient Details  Name: Steve Vance MRN: [id number] Date of Birth: 20-Oct-1960   Medicare Important Message Given:  Yes     Juliann Pulse A Merced Brougham 04/02/2019, 11:20 AM

## 2019-04-02 NOTE — Progress Notes (Signed)
Post HD:    04/02/19 1500  Vital Signs  Temp 98.8 F (37.1 C)  Temp Source Oral  Pulse Rate 66  Pulse Rate Source Monitor  Resp 12  BP (!) 189/92  BP Location Left Arm  BP Method Automatic  Patient Position (if appropriate) Lying  Oxygen Therapy  SpO2 95 %  O2 Device Room Air  Pain Assessment  Pain Scale 0-10  Pain Score 0  Dialysis Weight  Weight (!) 138.7 kg  Type of Weight Post-Dialysis  Post-Hemodialysis Assessment  Rinseback Volume (mL) 250 mL  KECN 17.9 V  Dialyzer Clearance Lightly streaked  Duration of HD Treatment -hour(s) 1.5 hour(s)  Hemodialysis Intake (mL) 500 mL  UF Total -Machine (mL) 501 mL  Net UF (mL) 1 mL  Tolerated HD Treatment Yes  AVG/AVF Arterial Site Held (minutes)  (n/a )  AVG/AVF Venous Site Held (minutes)  (n/a )  Education / Care Plan  Dialysis Education Provided Yes  Documented Education in Care Plan Yes  Outpatient Plan of Care Reviewed and on Chart Yes

## 2019-04-02 NOTE — Progress Notes (Signed)
[facility]  ROUNDING NOTE   Subjective:   Seen and examined on first hemodialysis treatment. Tolerating treatment well. RIJ permcath.   Continued on furosemide gtt. UOP 2160mL  Objective:  Vital signs in last 24 hours:  Temp:  [97.6 F (36.4 C)-98.8 F (37.1 C)] 98.8 F (37.1 C) (07/27 1500) Pulse Rate:  [62-109] 66 (07/27 1500) Resp:  [6-28] 12 (07/27 1500) BP: (125-189)/(72-109) 189/92 (07/27 1500) SpO2:  [81 %-100 %] 95 % (07/27 1500) Weight:  [138.7 kg-139.3 kg] 138.7 kg (07/27 1500)  Weight change:  Filed Weights   04/02/19 1400 04/02/19 1449 04/02/19 1500  Weight: (!) 139.3 kg (!) 138.7 kg (!) 138.7 kg    Intake/Output: I/O last 3 completed shifts: In: 475.2 [P.O.:200; I.V.:275.2] Out: 3100 [Urine:3100]   Intake/Output this shift:  Total I/O In: 100 [P.O.:100] Out: 426 [Urine:425; Other:1]  Physical Exam: General: No acute distress  Head: Normocephalic, atraumatic. Moist oral mucosal membranes  Eyes: Anicteric  Neck: Supple, trachea midline  Lungs:  Clear to auscultation, normal effort  Heart: regular  Abdomen:  Soft, nontender, bowel sounds present  Extremities: 3+ brawny peripheral edema.  Neurologic: Awake, alert, following commands  Skin: No lesions       Basic Metabolic Panel: Recent Labs  Lab 03/29/19 0128 03/30/19 0456 03/30/19 1511 03/31/19 0616 04/02/19 0447  NA 139 136  --  135 136  K 3.1* 3.5  --  3.8 3.7  CL 101 102  --  101 100  CO2 28 25  --  24 26  GLUCOSE 62* 133*  --  128* 108*  BUN 41* 40*  --  45* 53*  CREATININE 5.14* 4.70*  --  5.16* 5.60*  CALCIUM 8.5* 8.4*  --  8.7* 8.7*  MG  --   --   --  2.1  --   PHOS  --   --  5.7* 6.4*  --     Liver Function Tests: Recent Labs  Lab 03/30/19 0456 03/31/19 0616  AST 23 19  ALT 24 23  ALKPHOS 85 86  BILITOT 0.5 0.6  PROT 6.8 7.0  ALBUMIN 2.6* 2.6*   No results for input(s): LIPASE, AMYLASE in the last 168 hours. No results for input(s): AMMONIA in the  last 168 hours.  CBC: Recent Labs  Lab 03/29/19 0128 03/30/19 0456 03/31/19 0616 04/02/19 0447  WBC 7.8 5.5 5.3 5.3  HGB 7.9* 7.6* 7.8* 7.2*  HCT 26.6* 25.6* 25.8* 23.8*  MCV 82.9 81.0 80.4 79.3*  PLT 362 332 357 312    Cardiac Enzymes: No results for input(s): CKTOTAL, CKMB, CKMBINDEX, TROPONINI in the last 168 hours.  BNP: Invalid input(s): POCBNP  CBG: Recent Labs  Lab 04/01/19 1146 04/01/19 1720 04/01/19 2134 04/02/19 0750 04/02/19 1221  GLUCAP 125* 149* 133* 115* 101*    Microbiology: Results for orders placed or performed during the hospital encounter of 03/29/19  SARS Coronavirus 2 (CEPHEID - Performed in [place] hospital lab), Hosp Order     Status: None   Collection Time: 03/29/19  3:08 AM   Specimen: Nasopharyngeal Swab  Result Value Ref Range Status   SARS Coronavirus 2 NEGATIVE NEGATIVE Final    Comment: (NOTE) If result is NEGATIVE SARS-CoV-2 target nucleic acids are NOT DETECTED. The SARS-CoV-2 RNA is generally detectable in upper and lower  respiratory specimens during the acute phase of infection. The lowest  concentration of SARS-CoV-2 viral copies this assay can detect is 250  copies / mL. A negative result  does not preclude SARS-CoV-2 infection  and should not be used as the sole basis for treatment or other  patient management decisions.  A negative result may occur with  improper specimen collection / handling, submission of specimen other  than nasopharyngeal swab, presence of viral mutation(s) within the  areas targeted by this assay, and inadequate number of viral copies  (<250 copies / mL). A negative result must be combined with clinical  observations, patient history, and epidemiological information. If result is POSITIVE SARS-CoV-2 target nucleic acids are DETECTED. The SARS-CoV-2 RNA is generally detectable in upper and lower  respiratory specimens dur ing the acute phase of infection.  Positive  results are indicative of  active infection with SARS-CoV-2.  Clinical  correlation with patient history and other diagnostic information is  necessary to determine patient infection status.  Positive results do  not rule out bacterial infection or co-infection with other viruses. If result is PRESUMPTIVE POSTIVE SARS-CoV-2 nucleic acids MAY BE PRESENT.   A presumptive positive result was obtained on the submitted specimen  and confirmed on repeat testing.  While 2019 novel coronavirus  (SARS-CoV-2) nucleic acids may be present in the submitted sample  additional confirmatory testing may be necessary for epidemiological  and / or clinical management purposes  to differentiate between  SARS-CoV-2 and other Sarbecovirus currently known to infect humans.  If clinically indicated additional testing with an alternate test  methodology [phone]) is advised. The SARS-CoV-2 RNA is generally  detectable in upper and lower respiratory sp ecimens during the acute  phase of infection. The expected result is Negative. Fact Sheet for Patients:  StrictlyIdeas.no Fact Sheet for Healthcare Providers: BankingDealers.co.za This test is not yet approved or cleared by the [place] FDA and has been authorized for detection and/or diagnosis of SARS-CoV-2 by FDA under an Emergency Use Authorization (EUA).  This EUA will remain in effect (meaning this test can be used) for the duration of the COVID-19 declaration under Section 564(b)(1) of the Act, 21 U.S.C. section 360bbb-3(b)(1), unless the authorization is terminated or revoked sooner. Performed at [facility], [address] [place], [place] [address]   MRSA PCR Screening     Status: None   Collection Time: 03/29/19  5:45 AM   Specimen: Nasal Mucosa; Nasopharyngeal  Result Value Ref Range Status   MRSA by PCR NEGATIVE NEGATIVE Final    Comment:        The GeneXpert MRSA Assay (FDA approved for NASAL  specimens only), is one component of a comprehensive MRSA colonization surveillance program. It is not intended to diagnose MRSA infection nor to guide or monitor treatment for MRSA infections. Performed at [facility], [address] [place], [place] [address]     Coagulation Studies: No results for input(s): LABPROT, INR in the last 72 hours.  Urinalysis: No results for input(s): COLORURINE, LABSPEC, PHURINE, GLUCOSEU, HGBUR, BILIRUBINUR, KETONESUR, PROTEINUR, UROBILINOGEN, NITRITE, LEUKOCYTESUR in the last 72 hours.  Invalid input(s): APPERANCEUR    Imaging: No results found.   Medications:   . ceFAZolin    . furosemide (LASIX) infusion Stopped (04/02/19 1518)   . alum & mag hydroxide-simeth  30 mL Oral Once   And  . lidocaine  15 mL Oral Once  . amLODipine  5 mg Oral Daily  . aspirin EC  81 mg Oral Daily  . Chlorhexidine Gluconate Cloth  6 each Topical Q0600  . Chlorhexidine Gluconate Cloth  6 each Topical Q0600  . docusate sodium  100  mg Oral BID  . fentaNYL      . heparin      . heparin  5,000 Units Subcutaneous Q8H  . insulin aspart  0-5 Units Subcutaneous QHS  . insulin aspart  0-9 Units Subcutaneous TID WC  . labetalol  200 mg Oral BID  . lidocaine-EPINEPHrine      . LORazepam  2 mg Oral QHS  . metoprolol tartrate  25 mg Oral BID  . midazolam      . pantoprazole (PROTONIX) IV  40 mg Intravenous Q12H  . sertraline  100 mg Oral Daily  . tuberculin  5 Units Intradermal Once   acetaminophen **OR** acetaminophen, hydrALAZINE, HYDROcodone-acetaminophen, HYDROmorphone (DILAUDID) injection, labetalol, ondansetron **OR** ondansetron (ZOFRAN) IV, ondansetron (ZOFRAN) IV  Assessment/ Plan:   Steve Vance is a 58 y.o. black male with anxiety, diabetes mellitus type 2, hypertension, morbid obesity,  [facility] on 03/29/2019 for evaluation of shortness of breath. Found to have acute renal failure and requiring hemodialysis treatment.   1.  Acute renal  failure on chronic kidney disease stage III baseline creatinine 2.02, GFR of 41 on 05/23/2019. Nephrotic range proteinuria.  Chronic kidney disease secondary to diabetic nephropathy.  Acute renal failure secondary to acute cardiorenal syndrome.  Requiring hemodialysis. First treatment on 7/27. Seen and examined on hemodialysis treatment. Tolerating treatment well.  - Evaluate daily for dialysis need  2.  Hypertension and Acute on chronic diastolic heart failure: echocardiogram 7/23.  - UF with hemodialysis treatment - Discontinue furosemide gtt - Continue labetalol.   3.  Anemia of chronic kidney disease: hemoglobin 7.2. Microcytic. Iron studies are low.  Pending SPEP/UPEP.    - Will consider EPO with next treatment.   4.  Secondary hyperparathyroidism: PTH 190, phosphorus 6.4, calcium 8.7. Not currently on activated vitamin D agent or binders.    LOS: 4 Fernande Treiber 7/27/20203:21 PM

## 2019-04-02 NOTE — Plan of Care (Signed)
Patient asked for sleep medication.   Problem: Education: Goal: Knowledge of General Education information will improve Description: Including pain rating scale, medication(s)/side effects and non-pharmacologic comfort measures Outcome: Progressing   Problem: Health Behavior/Discharge Planning: Goal: Ability to manage health-related needs will improve Outcome: Progressing   Problem: Clinical Measurements: Goal: Ability to maintain clinical measurements within normal limits will improve Outcome: Progressing Goal: Will remain free from infection Outcome: Progressing Goal: Diagnostic test results will improve Outcome: Progressing Goal: Respiratory complications will improve Outcome: Progressing Goal: Cardiovascular complication will be avoided Outcome: Progressing   Problem: Activity: Goal: Risk for activity intolerance will decrease Outcome: Progressing   Problem: Nutrition: Goal: Adequate nutrition will be maintained Outcome: Progressing   Problem: Coping: Goal: Level of anxiety will decrease Outcome: Progressing   Problem: Elimination: Goal: Will not experience complications related to bowel motility Outcome: Progressing Goal: Will not experience complications related to urinary retention Outcome: Progressing   Problem: Pain Managment: Goal: General experience of comfort will improve Outcome: Progressing   Problem: Safety: Goal: Ability to remain free from injury will improve Outcome: Progressing   Problem: Skin Integrity: Goal: Risk for impaired skin integrity will decrease Outcome: Progressing

## 2019-04-02 NOTE — Op Note (Signed)
OPERATIVE NOTE    PRE-OPERATIVE DIAGNOSIS: 1. ESRD   POST-OPERATIVE DIAGNOSIS: same as above  PROCEDURE: 1. Ultrasound guidance for vascular access to the right internal jugular vein 2. Fluoroscopic guidance for placement of catheter 3. Placement of a 19 cm tip to cuff tunneled hemodialysis catheter via the right internal jugular vein  SURGEON: [name], MD  ANESTHESIA:  Local with Moderate conscious sedation for approximately 20 minutes using 2 mg of Versed and 50 mcg of Fentanyl  ESTIMATED BLOOD LOSS: 5 cc  FLUORO TIME: less than one minute  CONTRAST: none  FINDING(S): 1.  Patent right internal jugular vein  SPECIMEN(S):  None  INDICATIONS:   Steve Vance is a 58 y.o.male who presents with renal failure.  The patient needs long term dialysis access for their ESRD, and a Permcath is necessary.  Risks and benefits are discussed and informed consent is obtained.    DESCRIPTION: After obtaining full informed written consent, the patient was brought back to the vascular suited. The patient's right neck and chest were sterilely prepped and draped in a sterile surgical field was created. Moderate conscious sedation was administered during a face to face encounter with the patient throughout the procedure with my supervision of the RN administering medicines and monitoring the patient's vital signs, pulse oximetry, telemetry and mental status throughout from the start of the procedure until the patient was taken to the recovery room.  The right internal jugular vein was visualized with ultrasound and found to be patent. It was then accessed under direct ultrasound guidance and a permanent image was recorded. A wire was placed. After skin nick and dilatation, the peel-away sheath was placed over the wire. I then turned my attention to an area under the clavicle. Approximately 1-2 fingerbreadths below the clavicle a small counterincision was created and tunneled from the subclavicular  incision to the access site. Using fluoroscopic guidance, a 19 centimeter tip to cuff tunneled hemodialysis catheter was selected, and tunneled from the subclavicular incision to the access site. It was then placed through the peel-away sheath and the peel-away sheath was removed. Using fluoroscopic guidance the catheter tips were parked in the right atrium. The appropriate distal connectors were placed. It withdrew blood well and flushed easily with heparinized saline and a concentrated heparin solution was then placed. It was secured to the chest wall with 2 Prolene sutures. The access incision was closed single 4-0 Monocryl. A 4-0 Monocryl pursestring suture was placed around the exit site. Sterile dressings were placed. The patient tolerated the procedure well and was taken to the recovery room in stable condition.  COMPLICATIONS: None  CONDITION: Stable  [name], MD 04/02/2019 12:01 PM   This note was created with Dragon Medical transcription system. Any errors in dictation are purely unintentional.

## 2019-04-02 NOTE — Progress Notes (Signed)
[facility] Assessment:     04/02/19 1500  Neurological  Level of Consciousness Alert  Orientation Level Oriented to person;Oriented to place  Respiratory  Respiratory Pattern Irregular  Chest Assessment Chest expansion symmetrical  Bilateral Breath Sounds Clear;Diminished  Cardiac  Pulse Regular  Heart Sounds S1, S2  Jugular Venous Distention (JVD) No  ECG Monitor Yes  Vascular  R Radial Pulse +2  L Radial Pulse +2  Psychosocial  Psychosocial (WDL) WDL
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
## 2019-04-02 NOTE — Progress Notes (Signed)
Pre- HD Assessment:    04/02/19 1315  Neurological  Level of Consciousness Responds to Voice  Orientation Level Oriented to person  Respiratory  Respiratory Pattern Irregular  Chest Assessment Chest expansion symmetrical  Bilateral Breath Sounds Diminished (expiratory grunting)  Cough None  Cardiac  Heart Sounds S1, S2  Vascular  R Radial Pulse +2  L Radial Pulse +2  Psychosocial  Psychosocial (WDL) WDL

## 2019-04-02 NOTE — Progress Notes (Addendum)
HD Completed:     04/02/19 1449  Vital Signs  Temp 98.8 F (37.1 C)  Temp Source Oral  Pulse Rate 67  Pulse Rate Source Monitor  Resp 12  BP (!) 176/86  BP Location Left Arm  BP Method Automatic  Patient Position (if appropriate) Lying  Oxygen Therapy  SpO2 100 %  O2 Device Room Air  Pain Assessment  Pain Scale 0-10  Pain Score 0  Dialysis Weight  Weight (!) 138.7 kg  Type of Weight Post-Dialysis  During Hemodialysis Assessment  Blood Flow Rate (mL/min) 150 mL/min  Arterial Pressure (mmHg) -100 mmHg  Venous Pressure (mmHg) 70 mmHg  Transmembrane Pressure (mmHg) 40 mmHg  Ultrafiltration Rate (mL/min) 0 mL/min  Dialysate Flow Rate (mL/min) 340 ml/min  Conductivity: Machine  14  HD Safety Checks Performed Yes  Intra-Hemodialysis Comments Tx completed

## 2019-04-02 NOTE — Progress Notes (Signed)
Pre HD Tx:    04/02/19 1315  Vital Signs  Temp 98.6 F (37 C)  Temp Source Oral  Pulse Rate 65  Pulse Rate Source Monitor  Resp (!) 23  BP (!) 171/92  BP Location Left Arm  BP Method Automatic  Patient Position (if appropriate) Lying  Oxygen Therapy  SpO2 94 %  O2 Device Room Air  Pain Assessment  Pain Scale 0-10  Pain Score Asleep  Dialysis Weight  Weight (!) 139.3 kg  Type of Weight Pre-Dialysis  Time-Out for Hemodialysis  What Procedure? HD  Pt Identifiers(min of two) First/Last Name;MRN/Account#;Pt's DOB(use if MRN/Acct# not available  Correct Site? Yes  Correct Side? Yes  Correct Procedure? Yes  Consents Verified? Yes  Rad Studies Available? Yes  Safety Precautions Reviewed? Yes  Engineer, civil (consulting) Number 7  Station Number 2  UF/Alarm Test Passed  Conductivity: Meter 13.8  Conductivity: Machine  14  pH 7.6  Reverse Osmosis main   Normal Saline Lot Number [id number]  Dialyzer Lot Number [id number]  Disposable Set Lot Number [id number]  Machine Temperature 98.6 F (37 C)  Musician and Audible Yes  Blood Lines Intact and Secured Yes  Pre Treatment Patient Checks  Vascular access used during treatment Catheter  HD catheter dressing before treatment WDL  Patient is receiving dialysis in a chair  (bed)  Hepatitis B Surface Antigen Results Pending  Hepatitis B Surface Antibody  (unknwn)  Date Hepatitis B Surface Antibody Drawn 04/02/19  Hemodialysis Consent Verified Yes  Hemodialysis Standing Orders Initiated Yes  ECG (Telemetry) Monitor On Yes  Prime Ordered Normal Saline  Length of  DialysisTreatment -hour(s) 1.5 Hour(s)  Dialysis Treatment Comments  (Na 140)  Dialyzer Elisio 17H NR  Dialysate 2K;2.5 Ca  Dialysate Flow Ordered 300  Blood Flow Rate Ordered 150 mL/min  Ultrafiltration Goal 0 Liters  Dialysis Blood Pressure Support Ordered Normal Saline  Education / Care Plan  Dialysis Education Provided Yes  Documented Education in Care Plan  Yes  Outpatient Plan of Care Reviewed and on Chart Yes

## 2019-04-02 NOTE — Progress Notes (Signed)
HD Tx Initiated:    04/02/19 1400  Vital Signs  Temp 98.6 F (37 C)  Temp Source Oral  Pulse Rate 64  Pulse Rate Source Monitor  Resp (!) 28  BP (!) 169/89  BP Location Left Arm  BP Method Automatic  Patient Position (if appropriate) Lying  Oxygen Therapy  SpO2 99 %  O2 Device Room Air  Pain Assessment  Pain Scale 0-10  Pain Score Asleep  Dialysis Weight  Weight (!) 139.3 kg  Type of Weight Pre-Dialysis  During Hemodialysis Assessment  Blood Flow Rate (mL/min) 150 mL/min  Arterial Pressure (mmHg) -60 mmHg  Venous Pressure (mmHg) 50 mmHg  Transmembrane Pressure (mmHg) 40 mmHg  Ultrafiltration Rate (mL/min) 0 mL/min  Dialysate Flow Rate (mL/min) 340 ml/min  Conductivity: Machine  14  HD Safety Checks Performed Yes  Intra-Hemodialysis Comments Tx initiated

## 2019-04-02 NOTE — H&P (Signed)
[facility] History & Physical Update  The patient was interviewed and re-examined.  The patient's previous History and Physical has been reviewed and is unchanged.  There is no change in the plan of care. We plan to proceed with the scheduled procedure.  [name], MD  04/02/2019, 11:41 AM

## 2019-04-03 LAB — CBC
HCT: 25.5 % — ABNORMAL LOW (ref 39.0–52.0)
Hemoglobin: 7.7 g/dL — ABNORMAL LOW (ref 13.0–17.0)
MCH: 24.4 pg — ABNORMAL LOW (ref 26.0–34.0)
MCHC: 30.2 g/dL (ref 30.0–36.0)
MCV: 81 fL (ref 80.0–100.0)
Platelets: 321 10*3/uL (ref 150–400)
RBC: 3.15 MIL/uL — ABNORMAL LOW (ref 4.22–5.81)
RDW: 15.8 % — ABNORMAL HIGH (ref 11.5–15.5)
WBC: 5.7 10*3/uL (ref 4.0–10.5)
nRBC: 0 % (ref 0.0–0.2)

## 2019-04-03 LAB — GLUCOSE, CAPILLARY
Glucose-Capillary: 96 mg/dL (ref 70–99)
Glucose-Capillary: 117 mg/dL — ABNORMAL HIGH (ref 70–99)
Glucose-Capillary: 120 mg/dL — ABNORMAL HIGH (ref 70–99)
Glucose-Capillary: 203 mg/dL — ABNORMAL HIGH (ref 70–99)

## 2019-04-03 LAB — PROTEIN ELECTROPHORESIS, SERUM
A/G Ratio: 0.6 — ABNORMAL LOW (ref 0.7–1.7)
Albumin ELP: 2.5 g/dL — ABNORMAL LOW (ref 2.9–4.4)
Alpha-1-Globulin: 0.4 g/dL (ref 0.0–0.4)
Alpha-2-Globulin: 0.9 g/dL (ref 0.4–1.0)
Beta Globulin: 0.8 g/dL (ref 0.7–1.3)
Gamma Globulin: 1.9 g/dL — ABNORMAL HIGH (ref 0.4–1.8)
Globulin, Total: 4 g/dL — ABNORMAL HIGH (ref 2.2–3.9)
Total Protein ELP: 6.5 g/dL (ref 6.0–8.5)

## 2019-04-03 LAB — RENAL FUNCTION PANEL
Albumin: 2.7 g/dL — ABNORMAL LOW (ref 3.5–5.0)
Anion gap: 9 (ref 5–15)
BUN: 47 mg/dL — ABNORMAL HIGH (ref 6–20)
CO2: 27 mmol/L (ref 22–32)
Calcium: 8.9 mg/dL (ref 8.9–10.3)
Chloride: 100 mmol/L (ref 98–111)
Creatinine, Ser: 5.31 mg/dL — ABNORMAL HIGH (ref 0.61–1.24)
GFR calc Af Amer: 13 mL/min — ABNORMAL LOW (ref >60)
GFR calc non Af Amer: 11 mL/min — ABNORMAL LOW (ref >60)
Glucose, Bld: 117 mg/dL — ABNORMAL HIGH (ref 70–99)
Phosphorus: 6 mg/dL — ABNORMAL HIGH (ref 2.5–4.6)
Potassium: 3.9 mmol/L (ref 3.5–5.1)
Sodium: 136 mmol/L (ref 135–145)

## 2019-04-03 LAB — HEPATITIS B SURFACE ANTIGEN: Hepatitis B Surface Ag: NEGATIVE

## 2019-04-03 LAB — MPO/PR-3 (ANCA) ANTIBODIES
ANCA Proteinase 3: <3.5 U/mL (ref 0.0–3.5)
Myeloperoxidase Abs: <9 U/mL (ref 0.0–9.0)

## 2019-04-03 LAB — HEPATITIS B CORE ANTIBODY, IGM: Hep B C IgM: NEGATIVE

## 2019-04-03 LAB — HEPATITIS B SURFACE ANTIBODY, QUANTITATIVE: Hep B S AB Quant (Post): <3.1 m[IU]/mL — ABNORMAL LOW (ref >9.9)

## 2019-04-03 MED ORDER — PANTOPRAZOLE SODIUM 40 MG PO TBEC
40.0000 mg | DELAYED_RELEASE_TABLET | Freq: Every day | ORAL | Status: DC
  Administered 2019-04-04 – 2019-04-06 (×3): 40 mg via ORAL
  Filled 2019-04-03 (×3): qty 1

## 2019-04-03 MED ORDER — EPOETIN ALFA 10000 UNIT/ML IJ SOLN
10000.0000 [IU] | INTRAMUSCULAR | Status: DC
  Filled 2019-04-03: qty 1

## 2019-04-03 NOTE — Progress Notes (Signed)
[facility] [place] NAME: Steve Vance    MR#:  [id number]  DATE OF BIRTH:  Jul 19, 1961  SUBJECTIVE:   Patient received his first hemodialysis session yesterday and tolerated well.  Off the Lasix drip now.  Denies any worsening shortness of breath.  Plan for another hemodialysis treatment today.  REVIEW OF SYSTEMS:    Review of Systems  Constitutional: Negative for chills and fever.  HENT: Negative for congestion and tinnitus.   Eyes: Negative for blurred vision and double vision.  Respiratory: Negative for cough, shortness of breath and wheezing.   Cardiovascular: Positive for leg swelling. Negative for chest pain, orthopnea and PND.  Gastrointestinal: Negative for abdominal pain, diarrhea, nausea and vomiting.  Genitourinary: Negative for dysuria and hematuria.  Neurological: Negative for dizziness, sensory change and focal weakness.  All other systems reviewed and are negative.   Nutrition: Renal/Carb modified Tolerating Diet: Yes Tolerating PT: Await Eval.   DRUG ALLERGIES:   Allergies  Allergen Reactions  . Percocet [Oxycodone-Acetaminophen] Nausea And Vomiting    VITALS:  Blood pressure (!) 161/95, pulse 100, temperature 97.9 F (36.6 C), temperature source Oral, resp. rate 17, height $RemoveBe'5\' 11"'XjKNMAYgV$  (1.803 m), weight (!) 138.7 kg, SpO2 98 %.  PHYSICAL EXAMINATION:   Physical Exam  GENERAL:  58 y.o.-year-old obese patient sitting up in bed in no acute distress.  EYES: Pupils equal, round, reactive to light and accommodation. No scleral icterus. Extraocular muscles intact.  HEENT: Head atraumatic, normocephalic. Oropharynx and nasopharynx clear.  NECK:  Supple, no jugular venous distention. No thyroid enlargement, no tenderness.  LUNGS: Normal breath sounds bilaterally, no wheezing, rales, rhonchi. No use of accessory muscles of respiration.  CARDIOVASCULAR: S1, S2 normal. No murmurs, rubs, or gallops.  ABDOMEN: Soft,  nontender, nondistended. Bowel sounds present. No organomegaly or mass.  EXTREMITIES: No cyanosis, clubbing, +2-3 edema b/l. Signs of chronic venous stasis b/l.   NEUROLOGIC: Cranial nerves II through XII are intact. No focal Motor or sensory deficits b/l. Globally weak.    PSYCHIATRIC: The patient is alert and oriented x 3.  SKIN: No obvious rash, lesion, or ulcer.    LABORATORY PANEL:   CBC Recent Labs  Lab 04/03/19 0040  WBC 5.7  HGB 7.7*  HCT 25.5*  PLT 321   ------------------------------------------------------------------------------------------------------------------  Chemistries  Recent Labs  Lab 03/31/19 0616  04/03/19 0040  NA 135   < > 136  K 3.8   < > 3.9  CL 101   < > 100  CO2 24   < > 27  GLUCOSE 128*   < > 117*  BUN 45*   < > 47*  CREATININE 5.16*   < > 5.31*  CALCIUM 8.7*   < > 8.9  MG 2.1  --   --   AST 19  --   --   ALT 23  --   --   ALKPHOS 86  --   --   BILITOT 0.6  --   --    < > = values in this interval not displayed.   ------------------------------------------------------------------------------------------------------------------  Cardiac Enzymes No results for input(s): TROPONINI in the last 168 hours. ------------------------------------------------------------------------------------------------------------------  RADIOLOGY:  No results found.   ASSESSMENT AND PLAN:   58 year old male with past medical history of hypertension, diabetes, morbid obesity, chronic back pain, anxiety, medical noncompliance who presented to the hospital due to shortness of breath, worsening lower extremity edema and also noted to be in acute  renal failure.  1.  Accelerated hypertension/hypertensive urgency- patient presented to the hospital with systolic blood pressures over 200.  Initially was on a nicardipine drip but now has been weaned off of it. - Continue Norvasc, labetalol, Lopressor, cont. PRN Hydralazine - BP improved  2.  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  Patient is followed by [facility] nephrology but has not seen them in quite a while. -Status post PermCath placement yesterday and started on hemodialysis.  Had first dialysis treatment yesterday.  Plan for the treatment today. -Continue further care as per nephrology. - awaiting outpatient spot for HD.    3.  CHF-acute on chronic diastolic dysfunction. -Patient was on a Lasix drip but that has been discontinued.  Continue dialysis for fluid removal.  Patient is clinically stable. -Continue metoprolol.  4.  Depression-continue Zoloft.  5.  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   6. GERD - cont. Protonix.    All the records are reviewed and case discussed with Care Management/Social Worker. Management plans discussed with the patient, family and they are in agreement.  CODE STATUS: Full code  DVT Prophylaxis: Hep. SQ  TOTAL TIME TAKING CARE OF THIS PATIENT: 30 minutes.   POSSIBLE D/C IN 2-3 DAYS, DEPENDING ON CLINICAL CONDITION.   Henreitta Leber M.D on 04/03/2019 at 2:59 PM  Between 7am to 6pm - Pager - [phone]  After 6pm go to [email or web address] - [facility]  Office  [phone]  CC: Primary care physician; [place]

## 2019-04-03 NOTE — Progress Notes (Signed)
[facility]  ROUNDING NOTE   Subjective:   Lethargic and slow to answer questions. Alert and oriented.  First hemodialysis treatment yesterday. Tolerated treatment well.   Objective:  Vital signs in last 24 hours:  Temp:  [97.5 F (36.4 C)-98.8 F (37.1 C)] 97.9 F (36.6 C) (07/28 0753) Pulse Rate:  [64-101] 100 (07/28 0753) Resp:  [12-28] 17 (07/28 0753) BP: (147-189)/(78-102) 161/95 (07/28 0753) SpO2:  [81 %-100 %] 98 % (07/28 0753) Weight:  [138.7 kg-139.3 kg] 138.7 kg (07/27 1500)  Weight change:  Filed Weights   04/02/19 1400 04/02/19 1449 04/02/19 1500  Weight: (!) 139.3 kg (!) 138.7 kg (!) 138.7 kg    Intake/Output: I/O last 3 completed shifts: In: 404.5 [P.O.:340; I.V.:64.5] Out: 3276 [Urine:3275; Other:1]   Intake/Output this shift:  Total I/O In: 240 [P.O.:240] Out: 625 [Urine:625]  Physical Exam: General: No acute distress, laying in bed  Head: Normocephalic, atraumatic. Moist oral mucosal membranes  Eyes: Anicteric  Neck: Supple, trachea midline  Lungs:  Clear to auscultation, normal effort  Heart: regular  Abdomen:  Soft, nontender, bowel sounds present, obese  Extremities: 2+ brawny peripheral edema.  Neurologic: Awake, alert, following commands  Skin: No lesions       Basic Metabolic Panel: Recent Labs  Lab 03/29/19 0128 03/30/19 0456 03/30/19 1511 03/31/19 0616 04/02/19 0447 04/02/19 2023 04/03/19 0040  NA 139 136  --  135 136  --  136  K 3.1* 3.5  --  3.8 3.7  --  3.9  CL 101 102  --  101 100  --  100  CO2 28 25  --  24 26  --  27  GLUCOSE 62* 133*  --  128* 108*  --  117*  BUN 41* 40*  --  45* 53*  --  47*  CREATININE 5.14* 4.70*  --  5.16* 5.60*  --  5.31*  CALCIUM 8.5* 8.4*  --  8.7* 8.7*  --  8.9  MG  --   --   --  2.1  --   --   --   PHOS  --   --  5.7* 6.4*  --  6.2* 6.0*    Liver Function Tests: Recent Labs  Lab 03/30/19 0456 03/31/19 0616 04/03/19 0040  AST 23 19  --   ALT 24 23  --   ALKPHOS 85 86   --   BILITOT 0.5 0.6  --   PROT 6.8 7.0  --   ALBUMIN 2.6* 2.6* 2.7*   No results for input(s): LIPASE, AMYLASE in the last 168 hours. No results for input(s): AMMONIA in the last 168 hours.  CBC: Recent Labs  Lab 03/29/19 0128 03/30/19 0456 03/31/19 0616 04/02/19 0447 04/03/19 0040  WBC 7.8 5.5 5.3 5.3 5.7  HGB 7.9* 7.6* 7.8* 7.2* 7.7*  HCT 26.6* 25.6* 25.8* 23.8* 25.5*  MCV 82.9 81.0 80.4 79.3* 81.0  PLT 362 332 357 312 321    Cardiac Enzymes: No results for input(s): CKTOTAL, CKMB, CKMBINDEX, TROPONINI in the last 168 hours.  BNP: Invalid input(s): POCBNP  CBG: Recent Labs  Lab 04/02/19 1221 04/02/19 1636 04/02/19 2126 04/03/19 0756 04/03/19 1148  GLUCAP 101* 98 128* 96 117*    Microbiology: Results for orders placed or performed during the hospital encounter of 03/29/19  SARS Coronavirus 2 (CEPHEID - Performed in [facility] hospital lab), Hosp Order     Status: None   Collection Time: 03/29/19  3:08 AM   Specimen: Nasopharyngeal  Swab  Result Value Ref Range Status   SARS Coronavirus 2 NEGATIVE NEGATIVE Final    Comment: (NOTE) If result is NEGATIVE SARS-CoV-2 target nucleic acids are NOT DETECTED. The SARS-CoV-2 RNA is generally detectable in upper and lower  respiratory specimens during the acute phase of infection. The lowest  concentration of SARS-CoV-2 viral copies this assay can detect is 250  copies / mL. A negative result does not preclude SARS-CoV-2 infection  and should not be used as the sole basis for treatment or other  patient management decisions.  A negative result may occur with  improper specimen collection / handling, submission of specimen other  than nasopharyngeal swab, presence of viral mutation(s) within the  areas targeted by this assay, and inadequate number of viral copies  (<250 copies / mL). A negative result must be combined with clinical  observations, patient history, and epidemiological information. If result is  POSITIVE SARS-CoV-2 target nucleic acids are DETECTED. The SARS-CoV-2 RNA is generally detectable in upper and lower  respiratory specimens dur ing the acute phase of infection.  Positive  results are indicative of active infection with SARS-CoV-2.  Clinical  correlation with patient history and other diagnostic information is  necessary to determine patient infection status.  Positive results do  not rule out bacterial infection or co-infection with other viruses. If result is PRESUMPTIVE POSTIVE SARS-CoV-2 nucleic acids MAY BE PRESENT.   A presumptive positive result was obtained on the submitted specimen  and confirmed on repeat testing.  While 2019 novel coronavirus  (SARS-CoV-2) nucleic acids may be present in the submitted sample  additional confirmatory testing may be necessary for epidemiological  and / or clinical management purposes  to differentiate between  SARS-CoV-2 and other Sarbecovirus currently known to infect humans.  If clinically indicated additional testing with an alternate test  methodology [phone]) is advised. The SARS-CoV-2 RNA is generally  detectable in upper and lower respiratory sp ecimens during the acute  phase of infection. The expected result is Negative. Fact Sheet for Patients:  BoilerBrush.com.cy Fact Sheet for Healthcare Providers: https://pope.com/ This test is not yet approved or cleared by the [place] FDA and has been authorized for detection and/or diagnosis of SARS-CoV-2 by FDA under an Emergency Use Authorization (EUA).  This EUA will remain in effect (meaning this test can be used) for the duration of the COVID-19 declaration under Section 564(b)(1) of the Act, 21 U.S.C. section 360bbb-3(b)(1), unless the authorization is terminated or revoked sooner. Performed at [facility], [address] [place], [place] [address]   MRSA PCR Screening     Status: None   Collection  Time: 03/29/19  5:45 AM   Specimen: Nasal Mucosa; Nasopharyngeal  Result Value Ref Range Status   MRSA by PCR NEGATIVE NEGATIVE Final    Comment:        The GeneXpert MRSA Assay (FDA approved for NASAL specimens only), is one component of a comprehensive MRSA colonization surveillance program. It is not intended to diagnose MRSA infection nor to guide or monitor treatment for MRSA infections. Performed at [facility], [address] [place], [place] [address]     Coagulation Studies: No results for input(s): LABPROT, INR in the last 72 hours.  Urinalysis: No results for input(s): COLORURINE, LABSPEC, PHURINE, GLUCOSEU, HGBUR, BILIRUBINUR, KETONESUR, PROTEINUR, UROBILINOGEN, NITRITE, LEUKOCYTESUR in the last 72 hours.  Invalid input(s): APPERANCEUR    Imaging: No results found.   Medications:   . sodium chloride    .  sodium chloride     . alum & mag hydroxide-simeth  30 mL Oral Once   And  . lidocaine  15 mL Oral Once  . amLODipine  5 mg Oral Daily  . aspirin EC  81 mg Oral Daily  . Chlorhexidine Gluconate Cloth  6 each Topical Q0600  . Chlorhexidine Gluconate Cloth  6 each Topical Q0600  . docusate sodium  100 mg Oral BID  . heparin  5,000 Units Subcutaneous Q8H  . insulin aspart  0-5 Units Subcutaneous QHS  . insulin aspart  0-9 Units Subcutaneous TID WC  . labetalol  200 mg Oral BID  . LORazepam  2 mg Oral QHS  . metoprolol tartrate  25 mg Oral BID  . pantoprazole (PROTONIX) IV  40 mg Intravenous Q12H  . sertraline  100 mg Oral Daily  . tuberculin  5 Units Intradermal Once   sodium chloride, sodium chloride, acetaminophen **OR** acetaminophen, alteplase, heparin, hydrALAZINE, HYDROcodone-acetaminophen, HYDROmorphone (DILAUDID) injection, labetalol, lidocaine (PF), lidocaine-prilocaine, ondansetron **OR** ondansetron (ZOFRAN) IV, ondansetron (ZOFRAN) IV, pentafluoroprop-tetrafluoroeth  Assessment/ Plan:   Mr. Fritz Cauthon is a 58 y.o. black male  with anxiety, diabetes mellitus type 2, hypertension, morbid obesity,  [facility] on 03/29/2019 for evaluation of shortness of breath. Found to have acute renal failure and requiring hemodialysis treatment.   1.  Acute renal failure on chronic kidney disease stage III baseline creatinine 2.02, GFR of 41 on 05/22/2018. Nephrotic range proteinuria.  Chronic kidney disease secondary to diabetic nephropathy.  Acute renal failure secondary to acute cardiorenal syndrome.  Requiring hemodialysis. First treatment on 7/27. - Second hemodialysis treatment for today. Orders prepared.  - Evaluate daily for dialysis need  2.  Hypertension and Acute on chronic diastolic heart failure: echocardiogram 7/23.  - UF with hemodialysis treatment - Continue labetalol.   3.  Anemia of chronic kidney disease: hemoglobin 7.7. Negative SPEP/UPEP - EPO with HD treatment today.   4.  Secondary hyperparathyroidism:  - Not currently on activated vitamin D agent or binders.    LOS: 5 Deral Schellenberg 7/28/202012:34 PM

## 2019-04-04 LAB — GLUCOSE, CAPILLARY
Glucose-Capillary: 120 mg/dL — ABNORMAL HIGH (ref 70–99)
Glucose-Capillary: 102 mg/dL — ABNORMAL HIGH (ref 70–99)
Glucose-Capillary: 146 mg/dL — ABNORMAL HIGH (ref 70–99)

## 2019-04-04 LAB — PARATHYROID HORMONE, INTACT (NO CA): PTH: 152 pg/mL — ABNORMAL HIGH (ref 15–65)

## 2019-04-04 LAB — HEPATITIS B SURFACE ANTIBODY,QUALITATIVE: Hep B S Ab: NONREACTIVE

## 2019-04-04 LAB — HEPATITIS B CORE ANTIBODY, TOTAL: Hep B Core Total Ab: POSITIVE — AB

## 2019-04-04 MED ORDER — DIPHENHYDRAMINE HCL 50 MG/ML IJ SOLN
12.5000 mg | Freq: Once | INTRAMUSCULAR | Status: AC
  Administered 2019-04-04: 12.5 mg via INTRAVENOUS
  Filled 2019-04-04: qty 1

## 2019-04-04 NOTE — Progress Notes (Signed)
Hemodialysis treatment completed at 1340. Net UF removed 1.5 L. Blood rinsed back, CVC  flushed and locked with NS. Red caps replaced. Dressing remains intact and insertion site remains without signs and symptoms of infection.  Post hemodialysis report given to B. Tamala Julian, RN.    04/04/19 1340  Hand-Off documentation  Report given to (Full Name) Elizebeth Koller, RN  Vital Signs  Pulse Rate 74  Resp (!) 21  BP (!) 182/90  BP Location Left Arm  BP Method Automatic  Patient Position (if appropriate) Lying  Oxygen Therapy  SpO2 98 %  O2 Device Room Air  Pain Assessment  Pain Scale 0-10  Pain Score 0  During Hemodialysis Assessment  Dialysis Fluid Bolus Normal Saline  Bolus Amount (mL) 250 mL (rinseback)  Intra-Hemodialysis Comments Tx completed  Post-Hemodialysis Assessment  Rinseback Volume (mL) 250 mL  KECN 67.8 V  Dialyzer Clearance Lightly streaked  Duration of HD Treatment -hour(s) 3 hour(s)  Hemodialysis Intake (mL) 500 mL  UF Total -Machine (mL) 2000 mL  Net UF (mL) 1500 mL  Tolerated HD Treatment Yes  Education / Care Plan  Dialysis Education Provided Yes  Documented Education in Care Plan Yes  Outpatient Plan of Care Reviewed and on Chart Yes

## 2019-04-04 NOTE — Progress Notes (Signed)
Pre hemodialysis assessement   04/04/19 1040  Neurological  Level of Consciousness Alert  Orientation Level Oriented X4  Respiratory  Respiratory Pattern Regular;Unlabored  Chest Assessment Chest expansion symmetrical  Bilateral Breath Sounds Clear;Diminished  Cardiac  Pulse Regular  Heart Sounds S1, S2  Jugular Venous Distention (JVD) No  ECG Monitor Yes  Cardiac Rhythm NSR  Vascular  R Radial Pulse +2  L Radial Pulse +2  Edema Generalized;Left lower extremity;Right lower extremity  RUE Edema Non-pitting  LUE Edema Non-pitting  RLE Edema Non-pitting  LLE Edema Non-Pitting  Integumentary  Integumentary (WDL) WDL  Skin Color Appropriate for ethnicity  Skin Condition Dry  Skin Integrity Intact  Musculoskeletal  Musculoskeletal (WDL) X  Generalized Weakness Yes  Gastrointestinal  Bowel Sounds Assessment Active  GU Assessment  Genitourinary (WDL) WDL  Psychosocial  Psychosocial (WDL) WDL  Emotional support given Given to patient

## 2019-04-04 NOTE — Progress Notes (Signed)
Pre hemodialysis report received from B. Tamala Julian, RN. Received patient in acute room via bed. Awake, alert and verbally responsive. No acute distress noted. CVC without signs and symptoms of infection.  Dressing in place and uncompromised. CVC accessed per policy and found patent on each side. Hemodialysis treatment initiated at 1040. Plan for 3  hours treatment with UF of 1.5 L  as tolerated.    04/04/19 1040  Hand-Off documentation  Report received from (Full Name) Elizebeth Koller, RN  Vital Signs  Temp 98.2 F (36.8 C)  Temp Source Oral  Pulse Rate 73  Resp 18  BP (!) 173/84  BP Location Left Arm  BP Method Automatic  Patient Position (if appropriate) Lying  Oxygen Therapy  SpO2 99 %  O2 Device Room Air  Pain Assessment  Pain Scale 0-10  Pain Score 0  Time-Out for Hemodialysis  What Procedure? hemodialysis  Pt Identifiers(min of two) First/Last Name;MRN/Account#  Correct Site? Yes  Correct Side? Yes  Correct Procedure? Yes  Consents Verified? Yes  Rad Studies Available? N/A  Safety Precautions Reviewed? Yes  Engineer, civil (consulting) Number 3  Station Number 3  UF/Alarm Test Passed  Conductivity: Meter 14  Conductivity: Machine  13.9  pH 7.4  Reverse Osmosis main  Normal Saline Lot Number [id number]  Dialyzer Lot Number [id number]  Disposable Set Lot Number [id number]  Machine Temperature 98.6 F (37 C)  Musician and Audible Yes  Blood Lines Intact and Secured Yes  Pre Treatment Patient Checks  Vascular access used during treatment Catheter  HD catheter dressing before treatment WDL  Hepatitis B Surface Antigen Results Negative  Date Hepatitis B Surface Antigen Drawn 04/02/19  Hepatitis B Surface Antibody  (suceptible)  Date Hepatitis B Surface Antibody Drawn 04/02/19  Hemodialysis Consent Verified Yes  Hemodialysis Standing Orders Initiated Yes  ECG (Telemetry) Monitor On Yes  Prime Ordered Normal Saline  Length of  DialysisTreatment -hour(s) 3 Hour(s)  Dialyzer  Elisio 17H NR  Dialysate 2K;2.5 Ca  Dialysate Flow Ordered 800  Blood Flow Rate Ordered 400 mL/min  Ultrafiltration Goal 1500 Liters  During Hemodialysis Assessment  Blood Flow Rate (mL/min) 400 mL/min  Arterial Pressure (mmHg) -130 mmHg  Venous Pressure (mmHg) 110 mmHg  Transmembrane Pressure (mmHg) 50 mmHg  Ultrafiltration Rate (mL/min) 670 mL/min  Dialysate Flow Rate (mL/min) 800 ml/min  Conductivity: Machine  13.9  HD Safety Checks Performed Yes  Dialysis Fluid Bolus Normal Saline  Bolus Amount (mL) 250 mL (prime)  Intra-Hemodialysis Comments Tx initiated

## 2019-04-04 NOTE — Progress Notes (Signed)
Following this patient for hemodialysis placement, patient choose [facility] as clinic. Sent referral, patient education completed. Please contact me directly with any dialysis placement concerns.    Elvera Bicker Dialysis Coordinator [phone]

## 2019-04-04 NOTE — Progress Notes (Signed)
[facility]  ROUNDING NOTE   Subjective:   Seen and examined on hemodialysis treatment. Third treatment. Tolerating treatment well.   Laying in bed.   Objective:  Vital signs in last 24 hours:  Temp:  [97.8 F (36.6 C)-98.2 F (36.8 C)] 98.2 F (36.8 C) (07/29 1040) Pulse Rate:  [64-105] 76 (07/29 1230) Resp:  [10-25] 18 (07/29 1230) BP: (143-189)/(70-110) 179/87 (07/29 1230) SpO2:  [94 %-100 %] 99 % (07/29 1040)  Weight change:  Filed Weights   04/02/19 1400 04/02/19 1449 04/02/19 1500  Weight: (!) 139.3 kg (!) 138.7 kg (!) 138.7 kg    Intake/Output: I/O last 3 completed shifts: In: 540 [P.O.:540] Out: 4750 [Urine:3250; Other:1500]   Intake/Output this shift:  Total I/O In: 240 [P.O.:240] Out: -   Physical Exam: General: No acute distress, laying in bed  Head: Normocephalic, atraumatic. Moist oral mucosal membranes  Eyes: Anicteric  Neck: Supple, trachea midline  Lungs:  Clear to auscultation, normal effort  Heart: regular  Abdomen:  Soft, nontender, bowel sounds present, obese  Extremities: 1+ peripheral edema.  Neurologic: Awake, alert, following commands  Skin: No lesions       Basic Metabolic Panel: Recent Labs  Lab 03/29/19 0128 03/30/19 0456 03/30/19 1511 03/31/19 0616 04/02/19 0447 04/02/19 2023 04/03/19 0040  NA 139 136  --  135 136  --  136  K 3.1* 3.5  --  3.8 3.7  --  3.9  CL 101 102  --  101 100  --  100  CO2 28 25  --  24 26  --  27  GLUCOSE 62* 133*  --  128* 108*  --  117*  BUN 41* 40*  --  45* 53*  --  47*  CREATININE 5.14* 4.70*  --  5.16* 5.60*  --  5.31*  CALCIUM 8.5* 8.4*  --  8.7* 8.7*  --  8.9  MG  --   --   --  2.1  --   --   --   PHOS  --   --  5.7* 6.4*  --  6.2* 6.0*    Liver Function Tests: Recent Labs  Lab 03/30/19 0456 03/31/19 0616 04/03/19 0040  AST 23 19  --   ALT 24 23  --   ALKPHOS 85 86  --   BILITOT 0.5 0.6  --   PROT 6.8 7.0  --   ALBUMIN 2.6* 2.6* 2.7*   No results for input(s):  LIPASE, AMYLASE in the last 168 hours. No results for input(s): AMMONIA in the last 168 hours.  CBC: Recent Labs  Lab 03/29/19 0128 03/30/19 0456 03/31/19 0616 04/02/19 0447 04/03/19 0040  WBC 7.8 5.5 5.3 5.3 5.7  HGB 7.9* 7.6* 7.8* 7.2* 7.7*  HCT 26.6* 25.6* 25.8* 23.8* 25.5*  MCV 82.9 81.0 80.4 79.3* 81.0  PLT 362 332 357 312 321    Cardiac Enzymes: No results for input(s): CKTOTAL, CKMB, CKMBINDEX, TROPONINI in the last 168 hours.  BNP: Invalid input(s): POCBNP  CBG: Recent Labs  Lab 04/03/19 0756 04/03/19 1148 04/03/19 1638 04/03/19 2114 04/04/19 0745  GLUCAP 96 117* 120* 203* 120*    Microbiology: Results for orders placed or performed during the hospital encounter of 03/29/19  SARS Coronavirus 2 (CEPHEID - Performed in [facility] hospital lab), Hosp Order     Status: None   Collection Time: 03/29/19  3:08 AM   Specimen: Nasopharyngeal Swab  Result Value Ref Range Status   SARS Coronavirus 2  NEGATIVE NEGATIVE Final    Comment: (NOTE) If result is NEGATIVE SARS-CoV-2 target nucleic acids are NOT DETECTED. The SARS-CoV-2 RNA is generally detectable in upper and lower  respiratory specimens during the acute phase of infection. The lowest  concentration of SARS-CoV-2 viral copies this assay can detect is 250  copies / mL. A negative result does not preclude SARS-CoV-2 infection  and should not be used as the sole basis for treatment or other  patient management decisions.  A negative result may occur with  improper specimen collection / handling, submission of specimen other  than nasopharyngeal swab, presence of viral mutation(s) within the  areas targeted by this assay, and inadequate number of viral copies  (<250 copies / mL). A negative result must be combined with clinical  observations, patient history, and epidemiological information. If result is POSITIVE SARS-CoV-2 target nucleic acids are DETECTED. The SARS-CoV-2 RNA is generally detectable in  upper and lower  respiratory specimens dur ing the acute phase of infection.  Positive  results are indicative of active infection with SARS-CoV-2.  Clinical  correlation with patient history and other diagnostic information is  necessary to determine patient infection status.  Positive results do  not rule out bacterial infection or co-infection with other viruses. If result is PRESUMPTIVE POSTIVE SARS-CoV-2 nucleic acids MAY BE PRESENT.   A presumptive positive result was obtained on the submitted specimen  and confirmed on repeat testing.  While 2019 novel coronavirus  (SARS-CoV-2) nucleic acids may be present in the submitted sample  additional confirmatory testing may be necessary for epidemiological  and / or clinical management purposes  to differentiate between  SARS-CoV-2 and other Sarbecovirus currently known to infect humans.  If clinically indicated additional testing with an alternate test  methodology [phone]) is advised. The SARS-CoV-2 RNA is generally  detectable in upper and lower respiratory sp ecimens during the acute  phase of infection. The expected result is Negative. Fact Sheet for Patients:  StrictlyIdeas.no Fact Sheet for Healthcare Providers: BankingDealers.co.za This test is not yet approved or cleared by the [place] FDA and has been authorized for detection and/or diagnosis of SARS-CoV-2 by FDA under an Emergency Use Authorization (EUA).  This EUA will remain in effect (meaning this test can be used) for the duration of the COVID-19 declaration under Section 564(b)(1) of the Act, 21 U.S.C. section 360bbb-3(b)(1), unless the authorization is terminated or revoked sooner. Performed at [facility], [address] [place], [place] [address]   MRSA PCR Screening     Status: None   Collection Time: 03/29/19  5:45 AM   Specimen: Nasal Mucosa; Nasopharyngeal  Result Value Ref Range Status   MRSA  by PCR NEGATIVE NEGATIVE Final    Comment:        The GeneXpert MRSA Assay (FDA approved for NASAL specimens only), is one component of a comprehensive MRSA colonization surveillance program. It is not intended to diagnose MRSA infection nor to guide or monitor treatment for MRSA infections. Performed at [facility], [address] [place], [place] [address]     Coagulation Studies: No results for input(s): LABPROT, INR in the last 72 hours.  Urinalysis: No results for input(s): COLORURINE, LABSPEC, PHURINE, GLUCOSEU, HGBUR, BILIRUBINUR, KETONESUR, PROTEINUR, UROBILINOGEN, NITRITE, LEUKOCYTESUR in the last 72 hours.  Invalid input(s): APPERANCEUR    Imaging: No results found.   Medications:   . sodium chloride    . sodium chloride     . alum & mag hydroxide-simeth  30 mL Oral Once   And  . lidocaine  15 mL Oral Once  . amLODipine  5 mg Oral Daily  . aspirin EC  81 mg Oral Daily  . Chlorhexidine Gluconate Cloth  6 each Topical Q0600  . Chlorhexidine Gluconate Cloth  6 each Topical Q0600  . docusate sodium  100 mg Oral BID  . [START ON 04/05/2019] epoetin (EPOGEN/PROCRIT) injection  10,000 Units Intravenous Q T,Th,Sa-HD  . heparin  5,000 Units Subcutaneous Q8H  . insulin aspart  0-5 Units Subcutaneous QHS  . insulin aspart  0-9 Units Subcutaneous TID WC  . labetalol  200 mg Oral BID  . LORazepam  2 mg Oral QHS  . metoprolol tartrate  25 mg Oral BID  . pantoprazole  40 mg Oral Daily  . sertraline  100 mg Oral Daily  . tuberculin  5 Units Intradermal Once   sodium chloride, sodium chloride, acetaminophen **OR** acetaminophen, alteplase, heparin, hydrALAZINE, HYDROcodone-acetaminophen, HYDROmorphone (DILAUDID) injection, labetalol, lidocaine (PF), lidocaine-prilocaine, ondansetron **OR** ondansetron (ZOFRAN) IV, ondansetron (ZOFRAN) IV, pentafluoroprop-tetrafluoroeth  Assessment/ Plan:   [name] is a 58 y.o. black male with anxiety, diabetes  mellitus type 2, hypertension, morbid obesity,  [facility] on 03/29/2019 for evaluation of shortness of breath. Found to have acute renal failure and requiring hemodialysis treatment.   1.  Acute renal failure on chronic kidney disease stage III baseline creatinine 2.02, GFR of 41 on 05/22/2018. Nephrotic range proteinuria.  Chronic kidney disease secondary to diabetic nephropathy.  Acute renal failure secondary to acute cardiorenal syndrome.  Requiring hemodialysis. First treatment on 7/27. - Third hemodialysis treatment today. Tolerating treatment well.  - Next treatment scheduled for tomorrow in a chair.  - Evaluate daily for dialysis need - Outpatient dialysis planning for [facility] TTS schedule.   2.  Hypertension and Acute on chronic diastolic heart failure: echocardiogram 7/23.  - UF with hemodialysis treatment - Continue labetalol.   3.  Anemia of chronic kidney disease: hemoglobin 7.7. Negative SPEP/UPEP - EPO with HD treatments TTS  4.  Secondary hyperparathyroidism:  - Not currently on activated vitamin D agent or binders.    LOS: [place] 7/29/202012:47 PM

## 2019-04-04 NOTE — Care Management Important Message (Signed)
Important Message  Patient Details  Name: Steve Vance MRN: [id number] Date of Birth: 1960-12-27   Medicare Important Message Given:  Yes     Juliann Pulse A Corona Popovich 04/04/2019, 12:20 PM

## 2019-04-05 LAB — RENAL FUNCTION PANEL
Albumin: 2.8 g/dL — ABNORMAL LOW (ref 3.5–5.0)
Anion gap: 9 (ref 5–15)
BUN: 30 mg/dL — ABNORMAL HIGH (ref 6–20)
CO2: 28 mmol/L (ref 22–32)
Calcium: 8.9 mg/dL (ref 8.9–10.3)
Chloride: 100 mmol/L (ref 98–111)
Creatinine, Ser: 3.69 mg/dL — ABNORMAL HIGH (ref 0.61–1.24)
GFR calc Af Amer: 20 mL/min — ABNORMAL LOW (ref >60)
GFR calc non Af Amer: 17 mL/min — ABNORMAL LOW (ref >60)
Glucose, Bld: 110 mg/dL — ABNORMAL HIGH (ref 70–99)
Phosphorus: 4 mg/dL (ref 2.5–4.6)
Potassium: 3.6 mmol/L (ref 3.5–5.1)
Sodium: 137 mmol/L (ref 135–145)

## 2019-04-05 LAB — CBC
HCT: 23.1 % — ABNORMAL LOW (ref 39.0–52.0)
Hemoglobin: 7 g/dL — ABNORMAL LOW (ref 13.0–17.0)
MCH: 24.3 pg — ABNORMAL LOW (ref 26.0–34.0)
MCHC: 30.3 g/dL (ref 30.0–36.0)
MCV: 80.2 fL (ref 80.0–100.0)
Platelets: 282 10*3/uL (ref 150–400)
RBC: 2.88 MIL/uL — ABNORMAL LOW (ref 4.22–5.81)
RDW: 15.4 % (ref 11.5–15.5)
WBC: 5 10*3/uL (ref 4.0–10.5)
nRBC: 0 % (ref 0.0–0.2)

## 2019-04-05 LAB — PREPARE RBC (CROSSMATCH)

## 2019-04-05 LAB — GLUCOSE, CAPILLARY
Glucose-Capillary: 83 mg/dL (ref 70–99)
Glucose-Capillary: 118 mg/dL — ABNORMAL HIGH (ref 70–99)

## 2019-04-05 MED ORDER — NEPRO/CARBSTEADY PO LIQD
237.0000 mL | Freq: Two times a day (BID) | ORAL | Status: DC
  Administered 2019-04-05: 237 mL via ORAL

## 2019-04-05 MED ORDER — AMLODIPINE BESYLATE 10 MG PO TABS
10.0000 mg | ORAL_TABLET | Freq: Every day | ORAL | Status: DC
  Administered 2019-04-06: 10 mg via ORAL
  Filled 2019-04-05: qty 1

## 2019-04-05 MED ORDER — RENA-VITE PO TABS
1.0000 | ORAL_TABLET | Freq: Every day | ORAL | Status: DC
  Administered 2019-04-05: 23:00:00 1 via ORAL
  Filled 2019-04-05: qty 1

## 2019-04-05 MED ORDER — MUSCLE RUB 10-15 % EX CREA
TOPICAL_CREAM | CUTANEOUS | Status: DC | PRN
  Filled 2019-04-05: qty 85

## 2019-04-05 MED ORDER — SODIUM CHLORIDE 0.9% IV SOLUTION
Freq: Once | INTRAVENOUS | Status: AC
  Administered 2019-04-05: 10 mL/h via INTRAVENOUS

## 2019-04-05 NOTE — Progress Notes (Signed)
[facility]  ROUNDING NOTE   Subjective:   Seen and examined on hemodialysis treatment. Fourth treatment. Tolerating treatment well. Sitting in chair    HEMODIALYSIS FLOWSHEET:  Blood Flow Rate (mL/min): 400 mL/min Arterial Pressure (mmHg): -130 mmHg Venous Pressure (mmHg): 140 mmHg Transmembrane Pressure (mmHg): 30 mmHg Ultrafiltration Rate (mL/min): 660 mL/min Dialysate Flow Rate (mL/min): 600 ml/min Conductivity: Machine : 13.9 Conductivity: Machine : 13.9 Dialysis Fluid Bolus: Normal Saline Bolus Amount (mL): 250 mL(rinseback)    Objective:  Vital signs in last 24 hours:  Temp:  [98.2 F (36.8 C)-98.6 F (37 C)] 98.3 F (36.8 C) (07/30 0406) Pulse Rate:  [72-78] 74 (07/30 0610) Resp:  [10-30] 20 (07/30 0406) BP: (159-189)/(82-100) 159/86 (07/30 0610) SpO2:  [94 %-100 %] 100 % (07/30 0406)  Weight change:  Filed Weights   04/02/19 1400 04/02/19 1449 04/02/19 1500  Weight: (!) 139.3 kg (!) 138.7 kg (!) 138.7 kg    Intake/Output: I/O last 3 completed shifts: In: 300 [P.O.:300] Out: 4000 [Urine:1000; Other:3000]   Intake/Output this shift:  No intake/output data recorded.  Physical Exam: General: No acute distress, in chair  Head: Normocephalic, atraumatic. Moist oral mucosal membranes  Eyes: Anicteric  Neck: Supple, trachea midline  Lungs:  Clear to auscultation, normal effort  Heart: regular  Abdomen:  Soft, nontender, bowel sounds present, obese  Extremities: trace peripheral edema.  Neurologic: Awake, alert, following commands  Skin: No lesions       Basic Metabolic Panel: Recent Labs  Lab 03/30/19 0456 03/30/19 1511 03/31/19 0616 04/02/19 0447 04/02/19 2023 04/03/19 0040 04/05/19 0507  NA 136  --  135 136  --  136 137  K 3.5  --  3.8 3.7  --  3.9 3.6  CL 102  --  101 100  --  100 100  CO2 25  --  24 26  --  27 28  GLUCOSE 133*  --  128* 108*  --  117* 110*  BUN 40*  --  45* 53*  --  47* 30*  CREATININE 4.70*  --  5.16*  5.60*  --  5.31* 3.69*  CALCIUM 8.4*  --  8.7* 8.7*  --  8.9 8.9  MG  --   --  2.1  --   --   --   --   PHOS  --  5.7* 6.4*  --  6.2* 6.0* 4.0    Liver Function Tests: Recent Labs  Lab 03/30/19 0456 03/31/19 0616 04/03/19 0040 04/05/19 0507  AST 23 19  --   --   ALT 24 23  --   --   ALKPHOS 85 86  --   --   BILITOT 0.5 0.6  --   --   PROT 6.8 7.0  --   --   ALBUMIN 2.6* 2.6* 2.7* 2.8*   No results for input(s): LIPASE, AMYLASE in the last 168 hours. No results for input(s): AMMONIA in the last 168 hours.  CBC: Recent Labs  Lab 03/30/19 0456 03/31/19 0616 04/02/19 0447 04/03/19 0040 04/05/19 0507  WBC 5.5 5.3 5.3 5.7 5.0  HGB 7.6* 7.8* 7.2* 7.7* 7.0*  HCT 25.6* 25.8* 23.8* 25.5* 23.1*  MCV 81.0 80.4 79.3* 81.0 80.2  PLT 332 357 312 321 282    Cardiac Enzymes: No results for input(s): CKTOTAL, CKMB, CKMBINDEX, TROPONINI in the last 168 hours.  BNP: Invalid input(s): POCBNP  CBG: Recent Labs  Lab 04/03/19 1638 04/03/19 2114 04/04/19 0745 04/04/19 1621 04/04/19 2059  GLUCAP 120* [place]    Microbiology: Results for orders placed or performed during the hospital encounter of 03/29/19  SARS Coronavirus 2 (CEPHEID - Performed in [place] hospital lab), Hosp Order     Status: None   Collection Time: 03/29/19  3:08 AM   Specimen: Nasopharyngeal Swab  Result Value Ref Range Status   SARS Coronavirus 2 NEGATIVE NEGATIVE Final    Comment: (NOTE) If result is NEGATIVE SARS-CoV-2 target nucleic acids are NOT DETECTED. The SARS-CoV-2 RNA is generally detectable in upper and lower  respiratory specimens during the acute phase of infection. The lowest  concentration of SARS-CoV-2 viral copies this assay can detect is 250  copies / mL. A negative result does not preclude SARS-CoV-2 infection  and should not be used as the sole basis for treatment or other  patient management decisions.  A negative result may occur with  improper specimen collection  / handling, submission of specimen other  than nasopharyngeal swab, presence of viral mutation(s) within the  areas targeted by this assay, and inadequate number of viral copies  (<250 copies / mL). A negative result must be combined with clinical  observations, patient history, and epidemiological information. If result is POSITIVE SARS-CoV-2 target nucleic acids are DETECTED. The SARS-CoV-2 RNA is generally detectable in upper and lower  respiratory specimens dur ing the acute phase of infection.  Positive  results are indicative of active infection with SARS-CoV-2.  Clinical  correlation with patient history and other diagnostic information is  necessary to determine patient infection status.  Positive results do  not rule out bacterial infection or co-infection with other viruses. If result is PRESUMPTIVE POSTIVE SARS-CoV-2 nucleic acids MAY BE PRESENT.   A presumptive positive result was obtained on the submitted specimen  and confirmed on repeat testing.  While 2019 novel coronavirus  (SARS-CoV-2) nucleic acids may be present in the submitted sample  additional confirmatory testing may be necessary for epidemiological  and / or clinical management purposes  to differentiate between  SARS-CoV-2 and other Sarbecovirus currently known to infect humans.  If clinically indicated additional testing with an alternate test  methodology [phone]) is advised. The SARS-CoV-2 RNA is generally  detectable in upper and lower respiratory sp ecimens during the acute  phase of infection. The expected result is Negative. Fact Sheet for Patients:  StrictlyIdeas.no Fact Sheet for Healthcare Providers: BankingDealers.co.za This test is not yet approved or cleared by the [place] FDA and has been authorized for detection and/or diagnosis of SARS-CoV-2 by FDA under an Emergency Use Authorization (EUA).  This EUA will remain in effect (meaning this  test can be used) for the duration of the COVID-19 declaration under Section 564(b)(1) of the Act, 21 U.S.C. section 360bbb-3(b)(1), unless the authorization is terminated or revoked sooner. Performed at [facility], [address] [place], [place] [address]   MRSA PCR Screening     Status: None   Collection Time: 03/29/19  5:45 AM   Specimen: Nasal Mucosa; Nasopharyngeal  Result Value Ref Range Status   MRSA by PCR NEGATIVE NEGATIVE Final    Comment:        The GeneXpert MRSA Assay (FDA approved for NASAL specimens only), is one component of a comprehensive MRSA colonization surveillance program. It is not intended to diagnose MRSA infection nor to guide or monitor treatment for MRSA infections. Performed at [facility], [address] [place], [place] [address]     Coagulation Studies: No results  for input(s): LABPROT, INR in the last 72 hours.  Urinalysis: No results for input(s): COLORURINE, LABSPEC, PHURINE, GLUCOSEU, HGBUR, BILIRUBINUR, KETONESUR, PROTEINUR, UROBILINOGEN, NITRITE, LEUKOCYTESUR in the last 72 hours.  Invalid input(s): APPERANCEUR    Imaging: No results found.   Medications:    . alum & mag hydroxide-simeth  30 mL Oral Once   And  . lidocaine  15 mL Oral Once  . amLODipine  5 mg Oral Daily  . aspirin EC  81 mg Oral Daily  . Chlorhexidine Gluconate Cloth  6 each Topical Q0600  . docusate sodium  100 mg Oral BID  . epoetin (EPOGEN/PROCRIT) injection  10,000 Units Intravenous Q T,Th,Sa-HD  . heparin  5,000 Units Subcutaneous Q8H  . insulin aspart  0-5 Units Subcutaneous QHS  . insulin aspart  0-9 Units Subcutaneous TID WC  . labetalol  200 mg Oral BID  . LORazepam  2 mg Oral QHS  . metoprolol tartrate  25 mg Oral BID  . pantoprazole  40 mg Oral Daily  . sertraline  100 mg Oral Daily   acetaminophen **OR** acetaminophen, hydrALAZINE, HYDROcodone-acetaminophen, HYDROmorphone (DILAUDID) injection, labetalol, ondansetron  **OR** ondansetron (ZOFRAN) IV, ondansetron (ZOFRAN) IV  Assessment/ Plan:   Steve Vance is a 58 y.o. black male with anxiety, diabetes mellitus type 2, hypertension, morbid obesity,  [facility] on 03/29/2019 for evaluation of shortness of breath. Found to have acute renal failure and requiring hemodialysis treatment.   1.  Acute renal failure on chronic kidney disease stage III baseline creatinine 2.02, GFR of 41 on 05/22/2018. Nephrotic range proteinuria.  Chronic kidney disease secondary to diabetic nephropathy.  Acute renal failure secondary to acute cardiorenal syndrome.  Requiring hemodialysis. First treatment on 7/27. - Fourth hemodialysis treatment today. Tolerating treatment well. Seated in chair - Evaluate daily for dialysis need - Outpatient dialysis planning for [facility] TTS schedule.   2.  Hypertension and Acute on chronic diastolic heart failure: echocardiogram 7/23.  - UF with hemodialysis treatment - Continue labetalol. Started on amlodipine this admission. Also no metoprolol? - discontinue metoprolol  3.  Anemia of chronic kidney disease: hemoglobin 7. Negative SPEP/UPEP - EPO with HD treatments TTS  4.  Secondary hyperparathyroidism:  - Not currently on activated vitamin D agent or binders.    LOS: 7 Zavon Hyson 7/30/20208:48 AM

## 2019-04-05 NOTE — Progress Notes (Signed)
Hemodialysis treatment completed at 1100.  Net UF removed 1L. Blood rinsed back, CV flushed and locked with NS . Red caps replaced. Dressing changed and insertion site remains without signs and symptoms of infection.  Post hemodialysis report given to M. Burns, RN.    04/05/19 1100  Hand-Off documentation  Report given to (Full Name) Doneen Poisson, RN  Vital Signs  Temp 98.2 F (36.8 C)  Temp Source Oral  Pulse Rate 77  Resp (!) 24  BP (!) 183/92  BP Location Left Arm  BP Method Automatic  Patient Position (if appropriate) Lying  Oxygen Therapy  SpO2 97 %  O2 Device Room Air  Pain Assessment  Pain Scale 0-10  Pain Score 0  During Hemodialysis Assessment  Blood Flow Rate (mL/min) 300 mL/min  KECN 53.5 KECN  Dialysis Fluid Bolus Normal Saline  Bolus Amount (mL) 250 mL (rinseback)  Intra-Hemodialysis Comments Tx completed  Post-Hemodialysis Assessment  Rinseback Volume (mL) 250 mL  KECN 53.5 V  Dialyzer Clearance Lightly streaked  Duration of HD Treatment -hour(s) 3 hour(s)  Hemodialysis Intake (mL) 500 mL  UF Total -Machine (mL) 1500 mL  Net UF (mL) 1000 mL  Tolerated HD Treatment Yes  Education / Care Plan  Dialysis Education Provided Yes  Documented Education in Care Plan Yes  Outpatient Plan of Care Reviewed and on Chart Yes

## 2019-04-05 NOTE — Progress Notes (Signed)
Pre hemodialysis assessment   04/05/19 0800  Neurological  Level of Consciousness Alert  Orientation Level Oriented X4  Respiratory  Respiratory Pattern Regular;Unlabored  Chest Assessment Chest expansion symmetrical  Bilateral Breath Sounds Clear;Diminished  Cough Non-productive  Cardiac  Pulse Regular  Heart Sounds S1, S2  Jugular Venous Distention (JVD) No  ECG Monitor Yes  Cardiac Rhythm NSR  Antiarrhythmic device No  Vascular  R Radial Pulse +2  L Radial Pulse +2  RUE Edema Non-pitting  LUE Edema Non-pitting  RLE Edema Non-pitting  LLE Edema Non-Pitting  Integumentary  Integumentary (WDL) WDL  Skin Color Appropriate for ethnicity  Skin Condition Dry  Skin Integrity Intact  Musculoskeletal  Musculoskeletal (WDL) X  Generalized Weakness Yes  Assistive Device Cane  Gastrointestinal  Bowel Sounds Assessment Active  GU Assessment  Genitourinary (WDL) WDL  Psychosocial  Psychosocial (WDL) WDL  Patient Behaviors Cooperative;Irritable  Emotional support given Given to patient

## 2019-04-05 NOTE — TOC Initial Note (Signed)
Transition of Care [facility]) - Initial/Assessment Note    Patient Details  Name: Steve Vance MRN: [id number] Date of Birth: Nov 24, 1960  [facility] of [facility] CM/SW Contact:    Candie Chroman, LCSW Phone Number: 04/05/2019, 2:21 PM  Clinical Narrative: CSW met with patient, introduced role, and explained that PT recommendations would be discussed. Patient is not interested in SNF placement but is agreeable to home health. Provided CMS list for agencies that serve [place]. [facility]. CSW called and made referral. Patient typically uses a cane at home but has two walkers as well. Outpatient HD center is pending. Patient stated while he's getting used to HD his fiance' will drive him but afterwards he plans to drive himself. No further concerns. CSW encouraged patient to contact CSW as needed. CSW will continue to follow patient for support and facilitate return home when stable.                 Expected Discharge Plan: [place] Barriers to Discharge: Continued Medical Work up, Waiting for outpatient dialysis   Patient Goals and CMS Choice   CMS Medicare.gov Compare Post Acute Care list provided to:: Patient    Expected Discharge Plan and Services Expected Discharge Plan: [place] Choice: [place] arrangements for the past 2 months: [place]: PT [place] Agency: [place] ([place]) Date [place]: 04/05/19   Representative spoke with at [place]: Corene Cornea  Prior Living Arrangements/Services Living arrangements for the past 2 months: [place] Lives with:: Parents Patient language and need for interpreter reviewed:: Yes Do you feel safe going back to the place where you live?: Yes      Need for Family Participation in Patient Care: Yes (Comment) Care giver support system in place?: Yes (comment) Current home  services: DME Criminal Activity/Legal Involvement Pertinent to Current Situation/Hospitalization: No - Comment as needed  Activities of Daily Living Home Assistive Devices/Equipment: Gilford Rile (specify type) ADL Screening (condition at time of admission) Patient's cognitive ability adequate to safely complete daily activities?: Yes Is the patient deaf or have difficulty hearing?: No Does the patient have difficulty seeing, even when wearing glasses/contacts?: No Does the patient have difficulty concentrating, remembering, or making decisions?: No Patient able to express need for assistance with ADLs?: Yes Does the patient have difficulty dressing or bathing?: Yes Independently performs ADLs?: Yes (appropriate for developmental age) Does the patient have difficulty walking or climbing stairs?: Yes Weakness of Legs: Both Weakness of Arms/Hands: None  Permission Sought/Granted Permission sought to share information with : Facility Art therapist granted to share information with : Yes, Verbal Permission Granted     Permission granted to share info w AGENCY: [facility]        Emotional Assessment Appearance:: Appears stated age Attitude/Demeanor/Rapport: Engaged, Gracious Affect (typically observed): Accepting, Appropriate, Calm, Pleasant Orientation: : Oriented to Self, Oriented to Place, Oriented to  Time, Oriented to Situation Alcohol / Substance Use: Never Used Psych Involvement: No (comment)  Admission diagnosis:  Acute pulmonary edema (HCC) [J81.0] Hypertensive emergency [I16.1] Acute kidney injury superimposed on chronic kidney disease ([place]) [N17.9, N18.9] Acute on chronic anemia [D64.9] Patient Active Problem List   Diagnosis Date Noted  . Hypertensive emergency 03/29/2019  .  Essential hypertension 12/31/2016  . H/O medication noncompliance 12/31/2016  . Morbid obesity ([place]) 12/31/2016  . Chest pain 12/30/2016  . 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 Poorly controlled type 2 diabetes mellitus ([place]) 02/04/2015   PCP:  [place] [facility]:   [facility] [address]), [place] [address] [place] [address] Phone: [phone] Fax: [phone]  Medication Mgmt. [address] [place], [address] [place] [address] Phone: [phone] Fax: [phone]     Social Determinants of Health (SDOH) Interventions    Readmission Risk Interventions No flowsheet data found.

## 2019-04-05 NOTE — Progress Notes (Signed)
Pre hemodialysis report received from D. Songster, Therapist, sports. Received patient in acute room via bed. Awake, alert and verbally responsive. No acute distress noted. CVC without signs and symptoms of infection.  Dressing in place and uncompromised. CVC accessed  per policy and found patent on each side. Hemodialysis treatment initiated at 0800. Plan for 3 hours treatment with UF of 1L as tolerated.   04/05/19 0800  Hand-Off documentation  Report given to (Full Name) Lupita Dawn, RN  Vital Signs  Temp 98 F (36.7 C)  Temp Source Axillary  Pulse Rate 75  Resp (!) 26  BP (!) 177/96  BP Location Left Arm  BP Method Automatic  Patient Position (if appropriate) Sitting  Oxygen Therapy  SpO2 96 %  O2 Device Room Air  Pain Assessment  Pain Scale 0-10  Pain Score 0  Time-Out for Hemodialysis  What Procedure? hemodialysis  Pt Identifiers(min of two) First/Last Name;MRN/Account#  Correct Site? Yes  Correct Side? Yes  Correct Procedure? Yes  Consents Verified? Yes  Rad Studies Available? N/A  Safety Precautions Reviewed? Yes  Engineer, civil (consulting) Number 7  Station Number 2  UF/Alarm Test Passed  Conductivity: Meter 14  Conductivity: Machine  14.2  pH 7.4  Reverse Osmosis main  Normal Saline Lot Number [id number]  Dialyzer Lot Number [id number]  Disposable Set Lot Number [id number]  Machine Temperature 98.6 F (37 C)  Musician and Audible Yes  Blood Lines Intact and Secured Yes  Pre Treatment Patient Checks  Vascular access used during treatment Catheter  HD catheter dressing before treatment WDL  Patient is receiving dialysis in a chair Yes  Hepatitis B Surface Antigen Results Negative  Date Hepatitis B Surface Antigen Drawn 04/02/19  Hepatitis B Surface Antibody  (negative)  Date Hepatitis B Surface Antibody Drawn 04/02/19  Hemodialysis Consent Verified Yes  Hemodialysis Standing Orders Initiated Yes  ECG (Telemetry) Monitor On Yes  Prime Ordered Normal Saline  Length of   DialysisTreatment -hour(s) 3 Hour(s)  Dialyzer Elisio 17H NR  Dialysate 3K;2.5 Ca  Dialysate Flow Ordered 600  Blood Flow Rate Ordered 300 mL/min  Ultrafiltration Goal 1000 Liters  During Hemodialysis Assessment  Blood Flow Rate (mL/min) 300 mL/min  Arterial Pressure (mmHg) -90 mmHg  Venous Pressure (mmHg) 90 mmHg  Transmembrane Pressure (mmHg) 80 mmHg  Ultrafiltration Rate (mL/min) 500 mL/min  Dialysate Flow Rate (mL/min) 600 ml/min  Conductivity: Machine  14.3  HD Safety Checks Performed Yes  Dialysis Fluid Bolus Normal Saline  Bolus Amount (mL) 250 mL (prime)  Intra-Hemodialysis Comments Tx initiated

## 2019-04-05 NOTE — Progress Notes (Signed)
[place] at [place] NAME: Steve Vance    MR#:  [id number]  DATE OF BIRTH:  1961-03-22  SUBJECTIVE:   Patient seen during hemodialysis today, tolerating it well.  No other acute complaints or events overnight.  Still awaiting physical therapy evaluation.  Patient noted to be anemic with a hemoglobin down to 7.0.  REVIEW OF SYSTEMS:    Review of Systems  Constitutional: Negative for chills and fever.  HENT: Negative for congestion and tinnitus.   Eyes: Negative for blurred vision and double vision.  Respiratory: Negative for cough, shortness of breath and wheezing.   Cardiovascular: Positive for leg swelling. Negative for chest pain, orthopnea and PND.  Gastrointestinal: Negative for abdominal pain, diarrhea, nausea and vomiting.  Genitourinary: Negative for dysuria and hematuria.  Neurological: Negative for dizziness, sensory change and focal weakness.  All other systems reviewed and are negative.   Nutrition: Renal/Carb modified Tolerating Diet: Yes Tolerating PT: Await Eval.   DRUG ALLERGIES:   Allergies  Allergen Reactions  . Percocet [Oxycodone-Acetaminophen] Nausea And Vomiting    VITALS:  Blood pressure (!) 176/83, pulse 82, temperature 98.1 F (36.7 C), temperature source Oral, resp. rate (!) 24, height $RemoveBe'5\' 11"'mnYNGqjHd$  (1.803 m), weight (!) 138.7 kg, SpO2 96 %.  PHYSICAL EXAMINATION:   Physical Exam  GENERAL:  58 y.o.-year-old obese patient sitting up in chair getting HD.   EYES: Pupils equal, round, reactive to light and accommodation. No scleral icterus. Extraocular muscles intact.  HEENT: Head atraumatic, normocephalic. Oropharynx and nasopharynx clear.  NECK:  Supple, no jugular venous distention. No thyroid enlargement, no tenderness.  LUNGS: Normal breath sounds bilaterally, no wheezing, rales, rhonchi. No use of accessory muscles of respiration.  CARDIOVASCULAR: S1, S2 normal. No murmurs, rubs, or gallops.   ABDOMEN: Soft, nontender, nondistended. Bowel sounds present. No organomegaly or mass.  EXTREMITIES: No cyanosis, clubbing, +1-2 edema b/l. Signs of chronic venous stasis b/l.   NEUROLOGIC: Cranial nerves II through XII are intact. No focal Motor or sensory deficits b/l. Globally weak.    PSYCHIATRIC: The patient is alert and oriented x 3.  SKIN: No obvious rash, lesion, or ulcer.    LABORATORY PANEL:   CBC Recent Labs  Lab 04/05/19 0507  WBC 5.0  HGB 7.0*  HCT 23.1*  PLT 282   ------------------------------------------------------------------------------------------------------------------  Chemistries  Recent Labs  Lab 03/31/19 0616  04/05/19 0507  NA 135   < > 137  K 3.8   < > 3.6  CL 101   < > 100  CO2 24   < > 28  GLUCOSE 128*   < > 110*  BUN 45*   < > 30*  CREATININE 5.16*   < > 3.69*  CALCIUM 8.7*   < > 8.9  MG 2.1  --   --   AST 19  --   --   ALT 23  --   --   ALKPHOS 86  --   --   BILITOT 0.6  --   --    < > = values in this interval not displayed.   ------------------------------------------------------------------------------------------------------------------  Cardiac Enzymes No results for input(s): TROPONINI in the last 168 hours. ------------------------------------------------------------------------------------------------------------------  RADIOLOGY:  No results found.   ASSESSMENT AND PLAN:   58 year old male with past medical history of hypertension, diabetes, morbid obesity, chronic back pain, anxiety, medical noncompliance who presented to the hospital due to shortness of breath, worsening lower extremity edema and also noted  to be in acute renal failure.  1.  Accelerated hypertension/hypertensive urgency- patient presented to the hospital with systolic blood pressures over 200.  Initially was on a nicardipine drip but now has been weaned off of it. - Continue Norvasc but will increase dose, cont. labetalol, cont. PRN Hydralazine - BP  improved  2.  Acute on chronic renal failure progressed to ESRD on HD- patient's baseline creatinines around 2 and patient presented to the hospital with a creatinine over 5.  Patient is [facility] nephrology but has not seen them in quite a while. -Continue dialysis as per nephrology.  Patient has received dialysis the past 3 days.  Plan for outpatient spot on a Monday Wednesday Friday schedule.  Appreciate nephrology input.  3.  CHF-acute on chronic diastolic dysfunction. -Patient was on a Lasix drip but that has been discontinued.  Continue dialysis for fluid removal.  Patient is clinically stable. -Continue Labetalol.   4.  Depression-continue Zoloft.  5.  Diabetes type 2 with CKD stage III- continue sliding scale insulin and BS are stable.   6. GERD - cont. Protonix.    All the records are reviewed and case discussed with Care Management/Social Worker. Management plans discussed with the patient, family and they are in agreement.  CODE STATUS: Full code  DVT Prophylaxis: Hep. SQ  TOTAL TIME TAKING CARE OF THIS PATIENT: 30 minutes.   POSSIBLE D/C IN 2-3 DAYS, DEPENDING ON CLINICAL CONDITION.   Henreitta Leber M.D on 04/05/2019 at 2:04 PM  Between 7am to 6pm - Pager - [phone]  After 6pm go to [email or web address] - [employer] [facility]  Office  [phone]  CC: Primary care physician; [place]

## 2019-04-05 NOTE — Evaluation (Signed)
Physical Therapy Evaluation Patient Details Name: Steve Vance MRN: [id number] DOB: [birth year] Today's Date: 04/05/2019   History of Present Illness  Pt is a 58 year old male with past medical history of hypertension, diabetes, morbid obesity, chronic back pain, anxiety, medical noncompliance who presented to the hospital due to shortness of breath, worsening lower extremity edema and also noted to be in acute renal failure.  Pt now s/p right internal jugular vein Permcath placement and initiation of HD.  Assessment includes: Accelerated hypertension/hypertensive urgency, Acute on chronic renal failure progressed to ESRD on HD, CHF, depression, DM II, and GERD.    Clinical Impression  Pt presents with deficits in strength, transfers, mobility, gait, balance, and activity tolerance.  Pt required min A with bed mobility tasks and CGA during sit to/from stand transfers from an elevated EOB.  Pt required min verbal cues for sequencing for foot/hand positioning and increased trunk flexion during transfer training.  Pt was able to amb 3' x 1 and 10' x 1 with slow cadence and short, effortful step length.  Ambulation performed near EOB for safety with pt's Hgb 7.0. Pt's SpO2 and HR were WNL during the session with no adverse symptoms noted other than B knee pain.  Pt will benefit from PT services in a SNF setting upon discharge to safely address above deficits for decreased caregiver assistance and eventual return to PLOF.      Follow Up Recommendations SNF    Equipment Recommendations  None recommended by PT    Recommendations for Other Services       Precautions / Restrictions Precautions Precautions: Fall Precaution Comments: Recent Right IJ permcath placement Restrictions Weight Bearing Restrictions: No      Mobility  Bed Mobility Overal bed mobility: Needs Assistance Bed Mobility: Supine to Sit;Sit to Supine     Supine to sit: Min assist Sit to supine: Min assist   General bed  mobility comments: Min A for BLE control/positioning  Transfers Overall transfer level: Needs assistance Equipment used: Rolling walker (2 wheeled) Transfers: Sit to/from Stand Sit to Stand: From elevated surface;Min guard         General transfer comment: Pt required extra time and effort to stand from an elevated surface with min verbal cues for sequencing.    Ambulation/Gait Ambulation/Gait assistance: Min guard Gait Distance (Feet): 8 Feet Assistive device: Rolling walker (2 wheeled) Gait Pattern/deviations: Step-to pattern;Trunk flexed Gait velocity: decreased   General Gait Details: Heavy lean on the RW with amb with short effortful steps  Stairs            Wheelchair Mobility    Modified Rankin (Stroke Patients Only)       Balance Overall balance assessment: Needs assistance   Sitting balance-Leahy Scale: Good     Standing balance support: Bilateral upper extremity supported Standing balance-Leahy Scale: Fair                               Pertinent Vitals/Pain Pain Assessment: 0-10 Pain Score: 7  Pain Location: B knees Pain Descriptors / Indicators: Aching;Sore Pain Intervention(s): Monitored during session    Home Living Family/patient expects to be discharged to:: Private residence Living Arrangements: Parent Available Help at Discharge: Family;Available PRN/intermittently Type of Home: House Home Access: Ramped entrance     Home Layout: One level Home Equipment: Walker - 2 wheels;Cane - single point      Prior Function Level of Independence: Needs assistance   Gait /  Transfers Assistance Needed: Mod Ind amb with a SPC limited community distances with no falls in the last year  ADL's / Homemaking Assistance Needed: Ind with ADLs, brother and fiance assist with errands        Hand Dominance        Extremity/Trunk Assessment   Upper Extremity Assessment Upper Extremity Assessment: Generalized weakness    Lower  Extremity Assessment Lower Extremity Assessment: Generalized weakness       Communication      Cognition Arousal/Alertness: Awake/alert Behavior During Therapy: Flat affect Overall Cognitive Status: Within Functional Limits for tasks assessed                                        General Comments      Exercises Total Joint Exercises Ankle Circles/Pumps: Strengthening;Both;10 reps;5 reps Quad Sets: Strengthening;Both;5 reps;10 reps Gluteal Sets: Strengthening;Both;5 reps;10 reps Heel Slides: AAROM;Both;5 reps Hip ABduction/ADduction: AAROM;Both;5 reps Straight Leg Raises: AAROM;Both;5 reps Long Arc Quad: AROM;Both;5 reps;10 reps Knee Flexion: AROM;Both;5 reps;10 reps Marching in Standing: AROM;Both;5 reps   Assessment/Plan    PT Assessment Patient needs continued PT services  PT Problem List Decreased strength;Decreased activity tolerance;Decreased balance;Decreased mobility;Decreased knowledge of use of DME       PT Treatment Interventions DME instruction;Gait training;Functional mobility training;Therapeutic activities;Therapeutic exercise;Balance training;Patient/family education    PT Goals (Current goals can be found in the Care Plan section)  Acute Rehab PT Goals Patient Stated Goal: To get my strength back and return home PT Goal Formulation: With patient Time For Goal Achievement: 04/18/19 Potential to Achieve Goals: Good    Frequency Min 2X/week   Barriers to discharge Decreased caregiver support      Co-evaluation               AM-PAC PT "6 Clicks" Mobility  Outcome Measure Help needed turning from your back to your side while in a flat bed without using bedrails?: A Little Help needed moving from lying on your back to sitting on the side of a flat bed without using bedrails?: A Little Help needed moving to and from a bed to a chair (including a wheelchair)?: A Little Help needed standing up from a chair using your arms (e.g.,  wheelchair or bedside chair)?: A Little Help needed to walk in hospital room?: A Little Help needed climbing 3-5 steps with a railing? : A Lot 6 Click Score: 17    End of Session Equipment Utilized During Treatment: Gait belt Activity Tolerance: Patient tolerated treatment well Patient left: in bed;with call bell/phone within reach;with bed alarm set Nurse Communication: Mobility status;Patient requests pain meds PT Visit Diagnosis: Unsteadiness on feet (R26.81);Difficulty in walking, not elsewhere classified (R26.2);Muscle weakness (generalized) (M62.81)    Time: 1130-1200 PT Time Calculation (min) (ACUTE ONLY): 30 min   Charges:   PT Evaluation $PT Eval Low Complexity: 1 Low PT Treatments $Therapeutic Exercise: 8-22 mins        D. Royetta Asal PT, DPT 04/05/19, 12:23 PM

## 2019-04-06 LAB — CBC
HCT: 27.2 % — ABNORMAL LOW (ref 39.0–52.0)
Hemoglobin: 8.4 g/dL — ABNORMAL LOW (ref 13.0–17.0)
MCH: 24.7 pg — ABNORMAL LOW (ref 26.0–34.0)
MCHC: 30.9 g/dL (ref 30.0–36.0)
MCV: 80 fL (ref 80.0–100.0)
Platelets: 289 10*3/uL (ref 150–400)
RBC: 3.4 MIL/uL — ABNORMAL LOW (ref 4.22–5.81)
RDW: 15.5 % (ref 11.5–15.5)
WBC: 6.1 10*3/uL (ref 4.0–10.5)
nRBC: 0 % (ref 0.0–0.2)

## 2019-04-06 LAB — TYPE AND SCREEN
ABO/RH(D): AB POS
Antibody Screen: NEGATIVE
Unit division: 0

## 2019-04-06 LAB — BPAM RBC
Blood Product Expiration Date: 202008102359
ISSUE DATE / TIME: 202007301828
Unit Type and Rh: 7300

## 2019-04-06 LAB — GLUCOSE, CAPILLARY
Glucose-Capillary: 151 mg/dL — ABNORMAL HIGH (ref 70–99)
Glucose-Capillary: 114 mg/dL — ABNORMAL HIGH (ref 70–99)
Glucose-Capillary: 125 mg/dL — ABNORMAL HIGH (ref 70–99)

## 2019-04-06 MED ORDER — DIPHENHYDRAMINE HCL 25 MG PO CAPS
25.0000 mg | ORAL_CAPSULE | Freq: Four times a day (QID) | ORAL | Status: DC | PRN
  Administered 2019-04-06: 25 mg via ORAL
  Filled 2019-04-06: qty 1

## 2019-04-06 MED ORDER — AMLODIPINE BESYLATE 10 MG PO TABS: 10.0000 mg | ORAL_TABLET | Freq: Every day | ORAL | 1 refills | Status: DC

## 2019-04-06 MED ORDER — LABETALOL HCL 200 MG PO TABS: 200.0000 mg | ORAL_TABLET | Freq: Two times a day (BID) | ORAL | 1 refills | Status: DC

## 2019-04-06 NOTE — TOC Transition Note (Signed)
Transition of Care [facility]) - CM/SW Discharge Note   Patient Details  Name: Tabitha Tupper MRN: [id number] Date of Birth: [birth year]  Transition of Care [facility]) CM/SW Contact:  Candie Chroman, LCSW Phone Number: 04/06/2019, 11:44 AM   Clinical Narrative: Patient has discharge and home health orders. Advanced representative is aware. No DME needs. Patient's fiance' will pick him up to get him to his outpatient HD center by 1:00. CSW signing off.    Final next level of care: Home w Home Health Services Barriers to Discharge: Barriers Resolved   Patient Goals and CMS Choice   CMS Medicare.gov Compare Post Acute Care list provided to:: Patient Choice offered to / list presented to : Patient  Discharge Placement                Patient to be transferred to facility by: Girlfriend will pick him up.   Patient and family notified of of transfer: 04/06/19  Discharge Plan and Services     Post Acute Care Choice: Home Health          DME Arranged: N/A         HH Arranged: RN, PT, OT, Nurse's Aide, Social Work [employer] Agency: [place] ([place]) Date [facility]: 04/06/19   Representative spoke with at [place]: [place] ([place]) Interventions     Readmission Risk Interventions No flowsheet data found.

## 2019-04-06 NOTE — Progress Notes (Signed)
Patient is being discharged today. Dr. Juleen China asked if clinic was set up for patient to start today. Called clinic and they are ready for patient to start as long as he can arrive by 1:00. Spoke with Dr. Juleen China and Annye Rusk to make aware of this. Per Dr. Juleen China patient stated that his girlfriend would be able to pick him and get him to dialysis center today. Dialysis center is [place] MWF 1:30. Thank you everyone for all of your help in avoiding a Day of Dialysis Discharge.  Elvera Bicker Dialysis Coordinator  [phone]
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
## 2019-04-06 NOTE — Progress Notes (Signed)
[facility]  ROUNDING NOTE   Subjective:   Patient has completed four hemodialysis treatments. He feels well. Edema and shortness of breath has improved.   Objective:  Vital signs in last 24 hours:  Temp:  [97.7 F (36.5 C)-98.4 F (36.9 C)] 98.4 F (36.9 C) (07/31 1143) Pulse Rate:  [73-87] 78 (07/31 1143) Resp:  [15-20] 20 (07/31 0643) BP: (133-190)/(68-101) 179/97 (07/31 1143) SpO2:  [89 %-100 %] 94 % (07/31 1143) Weight:  [132.1 kg] 132.1 kg (07/31 0500)  Weight change:  Filed Weights   04/02/19 1449 04/02/19 1500 04/06/19 0500  Weight: (!) 138.7 kg (!) 138.7 kg 132.1 kg    Intake/Output: I/O last 3 completed shifts: In: 1060 [P.O.:120; I.V.:250; Blood:690] Out: 2050 [Urine:1050; Other:1000]   Intake/Output this shift:  Total I/O In: 240 [P.O.:240] Out: -   Physical Exam: General: No acute distress, in chair  Head: Normocephalic, atraumatic. Moist oral mucosal membranes  Eyes: Anicteric  Neck: Supple, trachea midline  Lungs:  Clear to auscultation, normal effort  Heart: regular  Abdomen:  Soft, nontender, bowel sounds present, obese  Extremities: trace peripheral edema.  Neurologic: Awake, alert, following commands  Skin: No lesions       Basic Metabolic Panel: Recent Labs  Lab 03/30/19 1511  03/31/19 0616 04/02/19 0447 04/02/19 2023 04/03/19 0040 04/05/19 0507  NA  --   --  135 136  --  136 137  K  --   --  3.8 3.7  --  3.9 3.6  CL  --   --  101 100  --  100 100  CO2  --   --  24 26  --  27 28  GLUCOSE  --   --  128* 108*  --  117* 110*  BUN  --   --  45* 53*  --  47* 30*  CREATININE  --   --  5.16* 5.60*  --  5.31* 3.69*  CALCIUM  --    < > 8.7* 8.7*  --  8.9 8.9  MG  --   --  2.1  --   --   --   --   PHOS 5.7*  --  6.4*  --  6.2* 6.0* 4.0   < > = values in this interval not displayed.    Liver Function Tests: Recent Labs  Lab 03/31/19 0616 04/03/19 0040 04/05/19 0507  AST 19  --   --   ALT 23  --   --   ALKPHOS 86  --    --   BILITOT 0.6  --   --   PROT 7.0  --   --   ALBUMIN 2.6* 2.7* 2.8*   No results for input(s): LIPASE, AMYLASE in the last 168 hours. No results for input(s): AMMONIA in the last 168 hours.  CBC: Recent Labs  Lab 03/31/19 0616 04/02/19 0447 04/03/19 0040 04/05/19 0507 04/06/19 0909  WBC 5.3 5.3 5.7 5.0 6.1  HGB 7.8* 7.2* 7.7* 7.0* 8.4*  HCT 25.8* 23.8* 25.5* 23.1* 27.2*  MCV 80.4 79.3* 81.0 80.2 80.0  PLT 357 312 321 282 289    Cardiac Enzymes: No results for input(s): CKTOTAL, CKMB, CKMBINDEX, TROPONINI in the last 168 hours.  BNP: Invalid input(s): POCBNP  CBG: Recent Labs  Lab 04/05/19 1137 04/05/19 1640 04/05/19 2135 04/06/19 0752 04/06/19 1141  GLUCAP 83 118* 151* 114* 125*    Microbiology: Results for orders placed or performed during the hospital encounter of 03/29/19  SARS Coronavirus 2 (CEPHEID - Performed in [place] hospital lab), Hosp Order     Status: None   Collection Time: 03/29/19  3:08 AM   Specimen: Nasopharyngeal Swab  Result Value Ref Range Status   SARS Coronavirus 2 NEGATIVE NEGATIVE Final    Comment: (NOTE) If result is NEGATIVE SARS-CoV-2 target nucleic acids are NOT DETECTED. The SARS-CoV-2 RNA is generally detectable in upper and lower  respiratory specimens during the acute phase of infection. The lowest  concentration of SARS-CoV-2 viral copies this assay can detect is 250  copies / mL. A negative result does not preclude SARS-CoV-2 infection  and should not be used as the sole basis for treatment or other  patient management decisions.  A negative result may occur with  improper specimen collection / handling, submission of specimen other  than nasopharyngeal swab, presence of viral mutation(s) within the  areas targeted by this assay, and inadequate number of viral copies  (<250 copies / mL). A negative result must be combined with clinical  observations, patient history, and epidemiological information. If result is  POSITIVE SARS-CoV-2 target nucleic acids are DETECTED. The SARS-CoV-2 RNA is generally detectable in upper and lower  respiratory specimens dur ing the acute phase of infection.  Positive  results are indicative of active infection with SARS-CoV-2.  Clinical  correlation with patient history and other diagnostic information is  necessary to determine patient infection status.  Positive results do  not rule out bacterial infection or co-infection with other viruses. If result is PRESUMPTIVE POSTIVE SARS-CoV-2 nucleic acids MAY BE PRESENT.   A presumptive positive result was obtained on the submitted specimen  and confirmed on repeat testing.  While 2019 novel coronavirus  (SARS-CoV-2) nucleic acids may be present in the submitted sample  additional confirmatory testing may be necessary for epidemiological  and / or clinical management purposes  to differentiate between  SARS-CoV-2 and other Sarbecovirus currently known to infect humans.  If clinically indicated additional testing with an alternate test  methodology [phone]) is advised. The SARS-CoV-2 RNA is generally  detectable in upper and lower respiratory sp ecimens during the acute  phase of infection. The expected result is Negative. Fact Sheet for Patients:  StrictlyIdeas.no Fact Sheet for Healthcare Providers: BankingDealers.co.za This test is not yet approved or cleared by the [place] FDA and has been authorized for detection and/or diagnosis of SARS-CoV-2 by FDA under an Emergency Use Authorization (EUA).  This EUA will remain in effect (meaning this test can be used) for the duration of the COVID-19 declaration under Section 564(b)(1) of the Act, 21 U.S.C. section 360bbb-3(b)(1), unless the authorization is terminated or revoked sooner. Performed at [facility], [address] [place], [place] [address]   MRSA PCR Screening     Status: None   Collection  Time: 03/29/19  5:45 AM   Specimen: Nasal Mucosa; Nasopharyngeal  Result Value Ref Range Status   MRSA by PCR NEGATIVE NEGATIVE Final    Comment:        The GeneXpert MRSA Assay (FDA approved for NASAL specimens only), is one component of a comprehensive MRSA colonization surveillance program. It is not intended to diagnose MRSA infection nor to guide or monitor treatment for MRSA infections. Performed at [facility], [address] [place], [place] [address]     Coagulation Studies: No results for input(s): LABPROT, INR in the last 72 hours.  Urinalysis: No results for input(s): COLORURINE, LABSPEC, [place], [place], [place], [place], [place], [place],  UROBILINOGEN, NITRITE, LEUKOCYTESUR in the last 72 hours.  Invalid input(s): APPERANCEUR    Imaging: No results found.   Medications:    . alum & mag hydroxide-simeth  30 mL Oral Once   And  . lidocaine  15 mL Oral Once  . amLODipine  10 mg Oral Daily  . aspirin EC  81 mg Oral Daily  . Chlorhexidine Gluconate Cloth  6 each Topical Q0600  . docusate sodium  100 mg Oral BID  . epoetin (EPOGEN/PROCRIT) injection  10,000 Units Intravenous Q T,Th,Sa-HD  . feeding supplement (NEPRO CARB STEADY)  237 mL Oral BID BM  . heparin  5,000 Units Subcutaneous Q8H  . insulin aspart  0-5 Units Subcutaneous QHS  . insulin aspart  0-9 Units Subcutaneous TID WC  . labetalol  200 mg Oral BID  . LORazepam  2 mg Oral QHS  . multivitamin  1 tablet Oral QHS  . pantoprazole  40 mg Oral Daily  . sertraline  100 mg Oral Daily   acetaminophen **OR** acetaminophen, diphenhydrAMINE, hydrALAZINE, HYDROcodone-acetaminophen, HYDROmorphone (DILAUDID) injection, labetalol, Muscle Rub, ondansetron **OR** ondansetron (ZOFRAN) IV, ondansetron (ZOFRAN) IV  Assessment/ Plan:   [name] is a 58 y.o. black male with anxiety, diabetes mellitus type 2, hypertension, morbid obesity,  [facility] on 03/29/2019 for evaluation of shortness  of breath. Found to have acute renal failure and requiring hemodialysis treatment.   1. 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  Secondary to diabetic nephropathy.  Requiring hemodialysis. First treatment on 7/27. - Outpatient dialysis planning for [facility] MWF schedule.   2.  Hypertension and Acute on chronic diastolic heart failure: echocardiogram 7/23.  - UF with hemodialysis treatment - Started on labetalol and amlodipine this admission.   3.  Anemia of chronic kidney disease:   Negative SPEP/UPEP - EPO with HD treatments    4.  Secondary hyperparathyroidism:  - Not currently on activated vitamin D agent or binders.    LOS: 8 Steve Vance 7/31/202012:29 PM

## 2019-04-06 NOTE — Care Management Important Message (Signed)
Important Message  Patient Details  Name: Steve Vance MRN: [id number] Date of Birth: 1961-08-31   Medicare Important Message Given:  Yes     Dannette Barbara 04/06/2019, 10:50 AM

## 2019-04-13 ENCOUNTER — Emergency Department: Payer: No Typology Code available for payment source

## 2019-04-13 ENCOUNTER — Emergency Department
Admission: EM | Admit: 2019-04-13 | Discharge: 2019-04-13 | Disposition: A | Discharge status: Home / Self Care | Payer: No Typology Code available for payment source | Attending: Emergency Medicine | Admitting: Emergency Medicine

## 2019-04-13 ENCOUNTER — Encounter: Payer: Self-pay | Admitting: Emergency Medicine

## 2019-04-13 DIAGNOSIS — Y92410 Unspecified street and highway as the place of occurrence of the external cause: Secondary | ICD-10-CM | POA: Insufficient documentation

## 2019-04-13 DIAGNOSIS — M545 Low back pain, unspecified: Secondary | ICD-10-CM

## 2019-04-13 DIAGNOSIS — E119 Type 2 diabetes mellitus without complications: Secondary | ICD-10-CM | POA: Diagnosis not present

## 2019-04-13 DIAGNOSIS — I10 Essential (primary) hypertension: Secondary | ICD-10-CM | POA: Insufficient documentation

## 2019-04-13 DIAGNOSIS — Z7982 Long term (current) use of aspirin: Secondary | ICD-10-CM | POA: Insufficient documentation

## 2019-04-13 DIAGNOSIS — Y9389 Activity, other specified: Secondary | ICD-10-CM | POA: Diagnosis not present

## 2019-04-13 DIAGNOSIS — Z79899 Other long term (current) drug therapy: Secondary | ICD-10-CM | POA: Diagnosis not present

## 2019-04-13 DIAGNOSIS — Y999 Unspecified external cause status: Secondary | ICD-10-CM | POA: Insufficient documentation

## 2019-04-13 DIAGNOSIS — Z7984 Long term (current) use of oral hypoglycemic drugs: Secondary | ICD-10-CM | POA: Diagnosis not present
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
## 2019-04-13 MED ORDER — CYCLOBENZAPRINE HCL 10 MG PO TABS
5.0000 mg | ORAL_TABLET | Freq: Once | ORAL | Status: AC
  Administered 2019-04-13: 5 mg via ORAL
  Filled 2019-04-13: qty 1

## 2019-04-13 MED ORDER — BACLOFEN 10 MG PO TABS: 10.0000 mg | ORAL_TABLET | Freq: Three times a day (TID) | ORAL | 0 refills | Status: DC | PRN
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
## 2019-04-17 ENCOUNTER — Encounter: Payer: Self-pay | Admitting: Emergency Medicine

## 2019-04-17 ENCOUNTER — Inpatient Hospital Stay
Admission: EM | Admit: 2019-04-17 | Discharge: 2019-04-19 | DRG: 638 | Disposition: A | Discharge status: Home / Self Care | Payer: Medicare Other | Source: Other Acute Inpatient Hospital | Attending: Internal Medicine | Admitting: Internal Medicine

## 2019-04-17 ENCOUNTER — Other Ambulatory Visit: Payer: Self-pay

## 2019-04-17 DIAGNOSIS — E11649 Type 2 diabetes mellitus with hypoglycemia without coma: Principal | ICD-10-CM | POA: Diagnosis present

## 2019-04-17 DIAGNOSIS — R410 Disorientation, unspecified: Secondary | ICD-10-CM | POA: Diagnosis present

## 2019-04-17 DIAGNOSIS — D631 Anemia in chronic kidney disease: Secondary | ICD-10-CM | POA: Diagnosis present

## 2019-04-17 DIAGNOSIS — Z992 Dependence on renal dialysis: Secondary | ICD-10-CM

## 2019-04-17 DIAGNOSIS — Z6841 Body Mass Index (BMI) 40.0 and over, adult: Secondary | ICD-10-CM

## 2019-04-17 DIAGNOSIS — T383X1A Poisoning by insulin and oral hypoglycemic [antidiabetic] drugs, accidental (unintentional), initial encounter: Secondary | ICD-10-CM

## 2019-04-17 DIAGNOSIS — N186 End stage renal disease: Secondary | ICD-10-CM | POA: Diagnosis present

## 2019-04-17 DIAGNOSIS — I120 Hypertensive chronic kidney disease with stage 5 chronic kidney disease or end stage renal disease: Secondary | ICD-10-CM | POA: Diagnosis present

## 2019-04-17 DIAGNOSIS — Z20828 Contact with and (suspected) exposure to other viral communicable diseases: Secondary | ICD-10-CM | POA: Diagnosis present

## 2019-04-17 DIAGNOSIS — Z8249 Family history of ischemic heart disease and other diseases of the circulatory system: Secondary | ICD-10-CM | POA: Diagnosis not present

## 2019-04-17 DIAGNOSIS — E876 Hypokalemia: Secondary | ICD-10-CM | POA: Diagnosis present

## 2019-04-17 DIAGNOSIS — Z7984 Long term (current) use of oral hypoglycemic drugs: Secondary | ICD-10-CM | POA: Diagnosis not present

## 2019-04-17 DIAGNOSIS — Z7982 Long term (current) use of aspirin: Secondary | ICD-10-CM

## 2019-04-17 DIAGNOSIS — E162 Hypoglycemia, unspecified: Secondary | ICD-10-CM

## 2019-04-17 DIAGNOSIS — E1122 Type 2 diabetes mellitus with diabetic chronic kidney disease: Secondary | ICD-10-CM | POA: Diagnosis present

## 2019-04-17 DIAGNOSIS — N2581 Secondary hyperparathyroidism of renal origin: Secondary | ICD-10-CM | POA: Diagnosis present

## 2019-04-17 HISTORY — DX: Chronic kidney disease, unspecified: N18.9

## 2019-04-17 LAB — CBC WITH DIFFERENTIAL/PLATELET
Abs Immature Granulocytes: 0.03 10*3/uL (ref 0.00–0.07)
Basophils Absolute: 0 10*3/uL (ref 0.0–0.1)
Basophils Relative: 1 %
Eosinophils Absolute: 0.2 10*3/uL (ref 0.0–0.5)
Eosinophils Relative: 3 %
HCT: 27.5 % — ABNORMAL LOW (ref 39.0–52.0)
Hemoglobin: 8.3 g/dL — ABNORMAL LOW (ref 13.0–17.0)
Immature Granulocytes: 1 %
Lymphocytes Relative: 19 %
Lymphs Abs: 1.1 10*3/uL (ref 0.7–4.0)
MCH: 24.1 pg — ABNORMAL LOW (ref 26.0–34.0)
MCHC: 30.2 g/dL (ref 30.0–36.0)
MCV: 79.7 fL — ABNORMAL LOW (ref 80.0–100.0)
Monocytes Absolute: 0.6 10*3/uL (ref 0.1–1.0)
Monocytes Relative: 11 %
Neutro Abs: 3.7 10*3/uL (ref 1.7–7.7)
Neutrophils Relative %: 65 %
Platelets: 335 10*3/uL (ref 150–400)
RBC: 3.45 MIL/uL — ABNORMAL LOW (ref 4.22–5.81)
RDW: 15.7 % — ABNORMAL HIGH (ref 11.5–15.5)
WBC: 5.6 10*3/uL (ref 4.0–10.5)
nRBC: 0 % (ref 0.0–0.2)

## 2019-04-17 LAB — GLUCOSE, CAPILLARY
Glucose-Capillary: 36 mg/dL — CL (ref 70–99)
Glucose-Capillary: 79 mg/dL (ref 70–99)
Glucose-Capillary: 108 mg/dL — ABNORMAL HIGH (ref 70–99)

## 2019-04-17 LAB — COMPREHENSIVE METABOLIC PANEL
ALT: 18 U/L (ref 0–44)
AST: 23 U/L (ref 15–41)
Albumin: 3.1 g/dL — ABNORMAL LOW (ref 3.5–5.0)
Alkaline Phosphatase: 126 U/L (ref 38–126)
Anion gap: 8 (ref 5–15)
BUN: 14 mg/dL (ref 6–20)
CO2: 27 mmol/L (ref 22–32)
Calcium: 8.6 mg/dL — ABNORMAL LOW (ref 8.9–10.3)
Chloride: 98 mmol/L (ref 98–111)
Creatinine, Ser: 2.69 mg/dL — ABNORMAL HIGH (ref 0.61–1.24)
GFR calc Af Amer: 29 mL/min — ABNORMAL LOW (ref >60)
GFR calc non Af Amer: 25 mL/min — ABNORMAL LOW (ref >60)
Glucose, Bld: 49 mg/dL — ABNORMAL LOW (ref 70–99)
Potassium: 2.6 mmol/L — CL (ref 3.5–5.1)
Sodium: 133 mmol/L — ABNORMAL LOW (ref 135–145)
Total Bilirubin: 0.4 mg/dL (ref 0.3–1.2)
Total Protein: 8.3 g/dL — ABNORMAL HIGH (ref 6.5–8.1)

## 2019-04-17 MED ORDER — HEPARIN SODIUM (PORCINE) 5000 UNIT/ML IJ SOLN
5000.0000 [IU] | Freq: Three times a day (TID) | INTRAMUSCULAR | Status: DC
  Administered 2019-04-17 – 2019-04-19 (×5): 5000 [IU] via SUBCUTANEOUS
  Filled 2019-04-17 (×5): qty 1

## 2019-04-17 MED ORDER — POTASSIUM CHLORIDE 20 MEQ/15ML (10%) PO SOLN
40.0000 meq | Freq: Once | ORAL | Status: AC
  Administered 2019-04-17: 20:00:00 40 meq via ORAL
  Filled 2019-04-17: qty 30

## 2019-04-17 MED ORDER — DEXTROSE 50 % IV SOLN
50.0000 mL | Freq: Once | INTRAVENOUS | Status: AC
  Administered 2019-04-17: 18:00:00 50 mL via INTRAVENOUS

## 2019-04-17 MED ORDER — BACLOFEN 10 MG PO TABS
10.0000 mg | ORAL_TABLET | Freq: Three times a day (TID) | ORAL | Status: DC | PRN
  Administered 2019-04-18: 10 mg via ORAL
  Filled 2019-04-17 (×2): qty 1

## 2019-04-17 MED ORDER — DEXTROSE 10 % IV SOLN
INTRAVENOUS | Status: DC
  Administered 2019-04-17: 18:00:00 via INTRAVENOUS
  Administered 2019-04-17: 500 mL via INTRAVENOUS
  Administered 2019-04-18 (×2): via INTRAVENOUS

## 2019-04-17 MED ORDER — AMLODIPINE BESYLATE 10 MG PO TABS
10.0000 mg | ORAL_TABLET | Freq: Every day | ORAL | Status: DC
  Administered 2019-04-18 – 2019-04-19 (×2): 10 mg via ORAL
  Filled 2019-04-17 (×2): qty 1

## 2019-04-17 MED ORDER — SODIUM CHLORIDE 4 MEQ/ML IV SOLN: INTRAVENOUS | Status: DC

## 2019-04-17 MED ORDER — DEXTROSE 50 % IV SOLN
INTRAVENOUS | Status: AC
  Administered 2019-04-17: 18:00:00 50 mL via INTRAVENOUS
  Filled 2019-04-17: qty 50

## 2019-04-17 MED ORDER — ASPIRIN EC 81 MG PO TBEC
81.0000 mg | DELAYED_RELEASE_TABLET | Freq: Every day | ORAL | Status: DC
  Administered 2019-04-18 – 2019-04-19 (×2): 81 mg via ORAL
  Filled 2019-04-17 (×2): qty 1

## 2019-04-17 MED ORDER — ACETAMINOPHEN 500 MG PO TABS: 500.0000 mg | ORAL_TABLET | Freq: Four times a day (QID) | ORAL | Status: DC | PRN

## 2019-04-17 MED ORDER — DOCUSATE SODIUM 100 MG PO CAPS: 100.0000 mg | ORAL_CAPSULE | Freq: Two times a day (BID) | ORAL | Status: DC | PRN

## 2019-04-17 MED ORDER — SERTRALINE HCL 50 MG PO TABS
100.0000 mg | ORAL_TABLET | Freq: Every day | ORAL | Status: DC
  Administered 2019-04-18 – 2019-04-19 (×2): 100 mg via ORAL
  Filled 2019-04-17 (×2): qty 2

## 2019-04-17 MED ORDER — LABETALOL HCL 200 MG PO TABS
200.0000 mg | ORAL_TABLET | Freq: Two times a day (BID) | ORAL | Status: DC
  Administered 2019-04-17 – 2019-04-19 (×4): 200 mg via ORAL
  Filled 2019-04-17 (×5): qty 1

## 2019-04-17 NOTE — ED Triage Notes (Signed)
Pt presents to ED via [facility] from dialysis center c/o hypoglycemia. Pt states he takes glipizide, has not eaten since breakfast. C/o generalized weakness. Denies cough, SOB, and pain. Initial CBG with EMS 34, rose to 121 after 237mL of D10. CBG on arrival 28. D50 given, EDP notified.
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
## 2019-04-17 NOTE — ED Notes (Signed)
Date and time results received: 04/17/19 1818  Test: Potassium Critical Value: 2.6  Name of Provider Notified: Dr. Charna Archer  Orders Received? Or Actions Taken?: Orders Received - See Orders for details

## 2019-04-17 NOTE — Progress Notes (Signed)
Family Meeting Note  Advance Directive:yes  Today a meeting took place with the Patient.  The following clinical team members were present during this meeting:MD  The following were discussed:Patient's diagnosis: Hypoglycemia, end-stage renal disease, hypertension, diabetes, Patient's progosis: Unable to determine and Goals for treatment: Full Code  Additional follow-up to be provided: Nephrology  Time spent during discussion:20 minutes  Vaughan Basta, MD

## 2019-04-18 LAB — CBC
HCT: 25 % — ABNORMAL LOW (ref 39.0–52.0)
Hemoglobin: 7.5 g/dL — ABNORMAL LOW (ref 13.0–17.0)
MCH: 23.5 pg — ABNORMAL LOW (ref 26.0–34.0)
MCHC: 30 g/dL (ref 30.0–36.0)
MCV: 78.4 fL — ABNORMAL LOW (ref 80.0–100.0)
Platelets: 323 10*3/uL (ref 150–400)
RBC: 3.19 MIL/uL — ABNORMAL LOW (ref 4.22–5.81)
RDW: 15.6 % — ABNORMAL HIGH (ref 11.5–15.5)
WBC: 5.4 10*3/uL (ref 4.0–10.5)
nRBC: 0 % (ref 0.0–0.2)

## 2019-04-18 LAB — GLUCOSE, CAPILLARY
Glucose-Capillary: 63 mg/dL — ABNORMAL LOW (ref 70–99)
Glucose-Capillary: 57 mg/dL — ABNORMAL LOW (ref 70–99)
Glucose-Capillary: 69 mg/dL — ABNORMAL LOW (ref 70–99)
Glucose-Capillary: 94 mg/dL (ref 70–99)
Glucose-Capillary: 77 mg/dL (ref 70–99)
Glucose-Capillary: 75 mg/dL (ref 70–99)
Glucose-Capillary: 89 mg/dL (ref 70–99)
Glucose-Capillary: 98 mg/dL (ref 70–99)
Glucose-Capillary: 137 mg/dL — ABNORMAL HIGH (ref 70–99)
Glucose-Capillary: 123 mg/dL — ABNORMAL HIGH (ref 70–99)
Glucose-Capillary: 145 mg/dL — ABNORMAL HIGH (ref 70–99)
Glucose-Capillary: 127 mg/dL — ABNORMAL HIGH (ref 70–99)
Glucose-Capillary: 163 mg/dL — ABNORMAL HIGH (ref 70–99)

## 2019-04-18 LAB — BASIC METABOLIC PANEL
Anion gap: 6 (ref 5–15)
BUN: 16 mg/dL (ref 6–20)
CO2: 27 mmol/L (ref 22–32)
Calcium: 8.6 mg/dL — ABNORMAL LOW (ref 8.9–10.3)
Chloride: 100 mmol/L (ref 98–111)
Creatinine, Ser: 3.14 mg/dL — ABNORMAL HIGH (ref 0.61–1.24)
GFR calc Af Amer: 24 mL/min — ABNORMAL LOW (ref >60)
GFR calc non Af Amer: 21 mL/min — ABNORMAL LOW (ref >60)
Glucose, Bld: 77 mg/dL (ref 70–99)
Potassium: 3 mmol/L — ABNORMAL LOW (ref 3.5–5.1)
Sodium: 133 mmol/L — ABNORMAL LOW (ref 135–145)

## 2019-04-18 LAB — SARS CORONAVIRUS 2 (TAT 6-24 HRS): SARS Coronavirus 2: NEGATIVE

## 2019-04-18 MED ORDER — POTASSIUM CHLORIDE CRYS ER 20 MEQ PO TBCR
40.0000 meq | EXTENDED_RELEASE_TABLET | Freq: Once | ORAL | Status: AC
  Administered 2019-04-18: 40 meq via ORAL
  Filled 2019-04-18: qty 2

## 2019-04-18 MED ORDER — DEXTROSE 50 % IV SOLN
25.0000 mL | Freq: Once | INTRAVENOUS | Status: AC
  Administered 2019-04-18: 25 mL via INTRAVENOUS

## 2019-04-18 MED ORDER — CHLORHEXIDINE GLUCONATE CLOTH 2 % EX PADS
6.0000 | MEDICATED_PAD | Freq: Every day | CUTANEOUS | Status: DC
  Administered 2019-04-18 – 2019-04-19 (×2): 6 via TOPICAL

## 2019-04-18 NOTE — Progress Notes (Addendum)
Inpatient Diabetes Program Recommendations  AACE/ADA: New Consensus Statement on Inpatient Glycemic Control  Target Ranges:  Prepandial:   less than 140 mg/dL      Peak postprandial:   less than 180 mg/dL (1-2 hours)      Critically ill patients:  140 - 180 mg/dL   Results for Steve Vance, Steve Vance (MRN [id number]) as of 04/18/2019 08:25  Ref. Range 04/17/2019 17:41 04/17/2019 18:20 04/17/2019 19:43 04/17/2019 22:45 04/18/2019 01:09 04/18/2019 01:42 04/18/2019 03:08 04/18/2019 04:55  Glucose-Capillary Latest Ref Range: 70 - 99 mg/dL 36 (LL) 79 108 (H) 63 (L) 57 (L) 69 (L) 94 77  Results for Steve Vance, Steve Vance (MRN [id number]) as of 04/18/2019 08:25  Ref. Range 04/06/2019 07:52 04/06/2019 11:41  Glucose-Capillary Latest Ref Range: 70 - 99 mg/dL 114 (H) 125 (H)  Results for Steve Vance, Steve Vance (MRN [id number]) as of 04/18/2019 08:25  Ref. Range 04/05/2019 11:37 04/05/2019 16:40 04/05/2019 21:35  Glucose-Capillary Latest Ref Range: 70 - 99 mg/dL 83 118 (H) 151 (H)  Results for Steve Vance, Steve Vance (MRN [id number]) as of 04/18/2019 08:25  Ref. Range 03/29/2019 16:31  Hemoglobin A1C Latest Ref Range: 4.8 - 5.6 % 6.8 (H)  Results for Steve Vance, Steve Vance (MRN [id number]) as of 04/18/2019 08:25  Ref. Range 03/29/2019 01:28  Hemoglobin Latest Ref Range: 13.0 - 17.0 g/dL 7.9 (L)   Review of Glycemic Control  Diabetes history: DM2 Outpatient Diabetes medications: Glipizide 5 mg BID Current orders for Inpatient glycemic control: CBG monitoring  Inpatient Diabetes Program Recommendations:  HgbA1C: A1C 6.8% on 03/29/19 indicating an average glucose of 148 mg/dl. However, hemoglobin was 7.9 g/dL on 03/29/19 so A1C results are likely not completely accurate.  Noted patient was admitted with hypoglycemia and was taking Glipizide 5 mg BID as an outpatient. Patient recently inpatient from 03/29/19 to 04/06/19 and received a total of Novolog 7 units over the course of the entire hospitalization (so very little insulin needed SQ for correction).  Would  recommend patient not be discharged on any DM medication, continue to monitor glucose outpatient, and to follow up with PCP regarding glycemic control.  NOTE: Patient was inpatient 03/29/19 to 04/06/19 did not require much Novolog correction at all. Also noted that patient was newly started on hemodialysis at last hospitalization. Patient admitted 04/17/19 with hypoglycemia and patient is ordered D10$RemoveBefor'@100'nEFUizTqTlOU$  ml/hr. Due to hypoglycemia, recent initiation of hemodialysis, and A1C results would recommend patient not be discharged on any DM medication, asked to continue checking glucose outpatient, and follow up with PCP regarding glycemic control.   Thanks, Barnie Alderman, RN, MSN, CDE Diabetes Coordinator Inpatient Diabetes Program [phone] (Team Pager from 8am to 5pm)

## 2019-04-18 NOTE — Progress Notes (Signed)
Pt's CBG 69. Notified Dr. Jannifer Franklin. Orders for the hypoglycemic protocol obtained. 1/2 amp of D50 administered. Will continue to monitor

## 2019-04-18 NOTE — Progress Notes (Signed)
[facility]  ROUNDING NOTE   Subjective:  Patient known to [place] from prior admission. Comes in now with hypoglycemia. Upon arriving here he was started on D10 drip. Patient did complete dialysis yesterday.   Objective:  Vital signs in last 24 hours:  Temp:  [97.8 F (36.6 C)-98.7 F (37.1 C)] 98.7 F (37.1 C) (08/12 0452) Pulse Rate:  [68-85] 85 (08/12 1416) Resp:  [10-19] 19 (08/12 1416) BP: (149-190)/(75-99) 166/81 (08/12 1416) SpO2:  [95 %-100 %] 100 % (08/12 1416) Weight:  [128 kg-136.1 kg] 128 kg (08/11 2233)  Weight change:  Filed Weights   04/17/19 1750 04/17/19 2233  Weight: 136.1 kg 128 kg    Intake/Output: I/O last 3 completed shifts: In: 1060 [I.V.:1060] Out: 300 [Urine:300]   Intake/Output this shift:  Total I/O In: 988.3 [P.O.:480; I.V.:508.3] Out: -   Physical Exam: General: No acute distress  Head: Normocephalic, atraumatic. Moist oral mucosal membranes  Eyes: Anicteric  Neck: Supple, trachea midline  Lungs:  Clear to auscultation, normal effort  Heart: regular  Abdomen:  Soft, nontender, bowel sounds present, obese  Extremities: trace peripheral edema.  Neurologic: Awake, alert, following commands  Skin: No lesions  Access:  Right internal jugular PermCath    Basic Metabolic Panel: Recent Labs  Lab 04/17/19 1746 04/18/19 0506  NA 133* 133*  K 2.6* 3.0*  CL 98 100  CO2 27 27  GLUCOSE 49* 77  BUN 14 16  CREATININE 2.69* 3.14*  CALCIUM 8.6* 8.6*    Liver Function Tests: Recent Labs  Lab 04/17/19 1746  AST 23  ALT 18  ALKPHOS 126  BILITOT 0.4  PROT 8.3*  ALBUMIN 3.1*   No results for input(s): LIPASE, AMYLASE in the last 168 hours. No results for input(s): AMMONIA in the last 168 hours.  CBC: Recent Labs  Lab 04/17/19 1746 04/18/19 0506  WBC 5.6 5.4  NEUTROABS 3.7  --   HGB 8.3* 7.5*  HCT 27.5* 25.0*  MCV 79.7* 78.4*  PLT 335 323    Cardiac Enzymes: No results for input(s): CKTOTAL, CKMB, CKMBINDEX,  TROPONINI in the last 168 hours.  BNP: Invalid input(s): POCBNP  CBG: Recent Labs  Lab 04/18/19 0455 04/18/19 0737 04/18/19 0935 04/18/19 1146 04/18/19 1406  GLUCAP 77 75 89 98 137*    Microbiology: Results for orders placed or performed during the hospital encounter of 04/17/19  SARS CORONAVIRUS 2 Nasal Swab Aptima Multi Swab     Status: None   Collection Time: 04/17/19  7:44 PM   Specimen: Aptima Multi Swab; Nasal Swab  Result Value Ref Range Status   SARS Coronavirus 2 NEGATIVE NEGATIVE Final    Comment: (NOTE) SARS-CoV-2 target nucleic acids are NOT DETECTED. The SARS-CoV-2 RNA is generally detectable in upper and lower respiratory specimens during the acute phase of infection. Negative results do not preclude SARS-CoV-2 infection, do not rule out co-infections with other pathogens, and should not be used as the sole basis for treatment or other patient management decisions. Negative results must be combined with clinical observations, patient history, and epidemiological information. The expected result is Negative. Fact Sheet for Patients: SugarRoll.be Fact Sheet for Healthcare Providers: https://www.woods-mathews.com/ This test is not yet approved or cleared by the [place] FDA and  has been authorized for detection and/or diagnosis of SARS-CoV-2 by FDA under an Emergency Use Authorization (EUA). This EUA will remain  in effect (meaning this test can be used) for the duration of the COVID-19 declaration under Section 56 4(b)(1)  of the Act, 21 U.S.C. section 360bbb-3(b)(1), unless the authorization is terminated or revoked sooner. Performed at [facility], [place] [address] [place], [place] [address]     Coagulation Studies: No results for input(s): LABPROT, INR in the last 72 hours.  Urinalysis: No results for input(s): COLORURINE, LABSPEC, PHURINE, GLUCOSEU, HGBUR, BILIRUBINUR, KETONESUR, PROTEINUR,  UROBILINOGEN, NITRITE, LEUKOCYTESUR in the last 72 hours.  Invalid input(s): APPERANCEUR    Imaging: No results found.   Medications:   . dextrose 100 mL/hr at 04/18/19 1410   . amLODipine  10 mg Oral Daily  . aspirin EC  81 mg Oral Daily  . Chlorhexidine Gluconate Cloth  6 each Topical Q0600  . heparin  5,000 Units Subcutaneous Q8H  . labetalol  200 mg Oral BID  . sertraline  100 mg Oral Daily   acetaminophen, baclofen, docusate sodium  Assessment/ Plan:   [name] is a 58 y.o. black male with anxiety, diabetes mellitus type 2, hypertension, morbid obesity,  admitted with hypoglycemia.  1. 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  Secondary to diabetic nephropathy.  Requiring hemodialysis. First treatment on 7/27. -Patient due for dialysis again tomorrow.  He dialyzes on TTS schedule.  2.    Hypertension.  Maintain the patient on amlodipine and labetalol for now.  3.  Anemia of chronic kidney disease:   Hemoglobin down to 7.5.  Consider starting the patient on Epogen tomorrow.  4.  Secondary hyperparathyroidism:  Check serum phosphorus and calcium during dialysis treatment tomorrow.   LOS: 1 [name] 8/12/20203:03 PM

## 2019-04-18 NOTE — Progress Notes (Signed)
Patient's last 2 CBGs were over 100 with last one being 123.  Made Dr. Estanislado Pandy aware, verbal order to stop D10 and keep monitoring CBGs.  Clarise Cruz, BSN

## 2019-04-18 NOTE — Progress Notes (Signed)
[place] [facility] [place] NAME: Steve Vance    MR#:  [id number]  DATE OF BIRTH:  1961/03/14  SUBJECTIVE:  CHIEF COMPLAINT:   Chief Complaint  Patient presents with  . Hypoglycemia  Seen and evaluated today Awake and responds to all verbal commands .  Sugars have been borderline Blood sugars are 63, 57, 69, 94  REVIEW OF SYSTEMS:    ROS  CONSTITUTIONAL: No documented fever. Has fatigue, weakness. No weight gain, no weight loss.  EYES: No blurry or double vision.  ENT: No tinnitus. No postnasal drip. No redness of the oropharynx.  RESPIRATORY: No cough, no wheeze, no hemoptysis. No dyspnea.  CARDIOVASCULAR: No chest pain. No orthopnea. No palpitations. No syncope.  GASTROINTESTINAL: No nausea, no vomiting or diarrhea. No abdominal pain. No melena or hematochezia.  GENITOURINARY: No dysuria or hematuria.  ENDOCRINE: No polyuria or nocturia. No heat or cold intolerance.  HEMATOLOGY: No anemia. No bruising. No bleeding.  INTEGUMENTARY: No rashes. No lesions.  MUSCULOSKELETAL: No arthritis. No swelling. No gout.  NEUROLOGIC: No numbness, tingling, or ataxia. No seizure-type activity.  PSYCHIATRIC: No anxiety. No insomnia. No ADD.   DRUG ALLERGIES:   Allergies  Allergen Reactions  . Percocet [Oxycodone-Acetaminophen] Nausea And Vomiting    VITALS:  Blood pressure (!) 153/77, pulse 79, temperature 98.7 F (37.1 C), temperature source Oral, resp. rate 18, height $RemoveBe'5\' 9"'eqUubFjPa$  (1.753 m), weight 128 kg, SpO2 96 %.  PHYSICAL EXAMINATION:   Physical Exam  GENERAL:  58 y.o.-year-old patient lying in the bed with no acute distress.  EYES: Pupils equal, round, reactive to light and accommodation. No scleral icterus. Extraocular muscles intact.  HEENT: Head atraumatic, normocephalic. Oropharynx and nasopharynx clear.  NECK:  Supple, no jugular venous distention. No thyroid enlargement, no tenderness.  LUNGS: Normal breath sounds bilaterally, no  wheezing, rales, rhonchi. No use of accessory muscles of respiration.  CARDIOVASCULAR: S1, S2 normal. No murmurs, rubs, or gallops.  ABDOMEN: Soft, nontender, nondistended. Bowel sounds present. No organomegaly or mass.  EXTREMITIES: No cyanosis, clubbing or edema b/l.    NEUROLOGIC: Cranial nerves II through XII are intact. No focal Motor or sensory deficits b/l.   PSYCHIATRIC: The patient is alert and oriented x 3.  SKIN: No obvious rash, lesion, or ulcer.   LABORATORY PANEL:   CBC Recent Labs  Lab 04/18/19 0506  WBC 5.4  HGB 7.5*  HCT 25.0*  PLT 323   ------------------------------------------------------------------------------------------------------------------ Chemistries  Recent Labs  Lab 04/17/19 1746 04/18/19 0506  NA 133* 133*  K 2.6* 3.0*  CL 98 100  CO2 27 27  GLUCOSE 49* 77  BUN 14 16  CREATININE 2.69* 3.14*  CALCIUM 8.6* 8.6*  AST 23  --   ALT 18  --   ALKPHOS 126  --   BILITOT 0.4  --    ------------------------------------------------------------------------------------------------------------------  Cardiac Enzymes No results for input(s): TROPONINI in the last 168 hours. ------------------------------------------------------------------------------------------------------------------  RADIOLOGY:  No results found.   ASSESSMENT AND PLAN:   58 year old male patient with history of chronic back pain, end-stage renal disease on dialysis, hypertension, obesity, anxiety disorder currently under hospitalist service  -Acute hypoglycemia Diabetic medications on hold On IV D10 drip Blood sugar still borderline Close monitoring of blood sugars Appreciate diabetic coordinator follow-up  -End-stage renal disease Nephrology follow-up and dialysis to continue  -Hypertension Continue Norvasc and labetalol  -Hypokalemia Replace potassium  All the records are reviewed and case discussed with Care Management/Social Worker. Management plans  discussed with the patient, family and they are in agreement.  CODE STATUS: Full code  DVT Prophylaxis: SCDs  TOTAL TIME TAKING CARE OF THIS PATIENT: 36 minutes.   POSSIBLE D/C IN 1 to 2 DAYS, DEPENDING ON CLINICAL CONDITION.  Saundra Shelling M.D on 04/18/2019 at 11:36 AM  Between 7am to 6pm - Pager - [phone]  After 6pm go to [email or web address] - password [email or web address] [facility]  Office  [phone]  CC: Primary care physician; [place]  Note: This dictation was prepared with Dragon dictation along with smaller Company secretary. Any transcriptional errors that result from this process are unintentional.

## 2019-04-19 LAB — BASIC METABOLIC PANEL
Anion gap: 11 (ref 5–15)
BUN: 21 mg/dL — ABNORMAL HIGH (ref 6–20)
CO2: 25 mmol/L (ref 22–32)
Calcium: 9.1 mg/dL (ref 8.9–10.3)
Chloride: 99 mmol/L (ref 98–111)
Creatinine, Ser: 3.92 mg/dL — ABNORMAL HIGH (ref 0.61–1.24)
GFR calc Af Amer: 18 mL/min — ABNORMAL LOW (ref >60)
GFR calc non Af Amer: 16 mL/min — ABNORMAL LOW (ref >60)
Glucose, Bld: 116 mg/dL — ABNORMAL HIGH (ref 70–99)
Potassium: 3.5 mmol/L (ref 3.5–5.1)
Sodium: 135 mmol/L (ref 135–145)

## 2019-04-19 LAB — HIV ANTIBODY (ROUTINE TESTING W REFLEX): HIV Screen 4th Generation wRfx: NONREACTIVE

## 2019-04-19 LAB — GLUCOSE, CAPILLARY
Glucose-Capillary: 102 mg/dL — ABNORMAL HIGH (ref 70–99)
Glucose-Capillary: 117 mg/dL — ABNORMAL HIGH (ref 70–99)
Glucose-Capillary: 124 mg/dL — ABNORMAL HIGH (ref 70–99)
Glucose-Capillary: 124 mg/dL — ABNORMAL HIGH (ref 70–99)
Glucose-Capillary: 139 mg/dL — ABNORMAL HIGH (ref 70–99)

## 2019-04-19 MED ORDER — LOSARTAN POTASSIUM 50 MG PO TABS: 50.0000 mg | ORAL_TABLET | Freq: Every day | ORAL | 0 refills | Status: DC

## 2019-04-19 MED ORDER — BLOOD GLUCOSE MONITOR KIT: PACK | 0 refills | Status: DC

## 2019-04-19 MED ORDER — LOSARTAN POTASSIUM 50 MG PO TABS
50.0000 mg | ORAL_TABLET | Freq: Every day | ORAL | Status: DC
  Administered 2019-04-19: 15:00:00 50 mg via ORAL
  Filled 2019-04-19 (×2): qty 1

## 2019-04-19 MED ORDER — EPOETIN ALFA 10000 UNIT/ML IJ SOLN
10000.0000 [IU] | INTRAMUSCULAR | Status: DC
  Administered 2019-04-19: 10000 [IU] via INTRAVENOUS

## 2019-04-19 NOTE — Progress Notes (Addendum)
HD Tx Paused:    04/19/19 1015  Vital Signs  Temp 98.4 F (36.9 C)  Temp Source Oral  Pulse Rate 86  Resp (!) 23  BP (!) 163/82  BP Location Left Arm  BP Method Automatic  Patient Position (if appropriate) Lying  Oxygen Therapy  SpO2 93 %  O2 Device Room Air  Pain Assessment  Pain Scale 0-10  Pain Score 0  During Hemodialysis Assessment  Blood Flow Rate (mL/min) 400 mL/min  Arterial Pressure (mmHg) -160 mmHg  Venous Pressure (mmHg) 150 mmHg  Transmembrane Pressure (mmHg) 50 mmHg  Ultrafiltration Rate (mL/min) 570 mL/min  Dialysate Flow Rate (mL/min) 800 ml/min  Conductivity: Machine  14.1  HD Safety Checks Performed Yes  Intra-Hemodialysis Comments Tx completed  Post-Hemodialysis Assessment  Rinseback Volume (mL) 250 mL  KECN 28.2 V  Dialyzer Clearance Lightly streaked  Duration of HD Treatment -hour(s) 1.15 hour(s)  Hemodialysis Intake (mL) 500 mL  UF Total -Machine (mL) 718 mL  Net UF (mL) 218 mL  Tolerated HD Treatment Yes  AVG/AVF Arterial Site Held (minutes)  (n/a)  AVG/AVF Venous Site Held (minutes)  (n/a)

## 2019-04-19 NOTE — Progress Notes (Signed)
HD Tx Initiated:    04/19/19 0900  Vital Signs  Temp 98.5 F (36.9 C)  Temp Source Oral  Pulse Rate 78  Pulse Rate Source Dinamap  Resp (!) 0  BP (!) 185/97  BP Location Left Arm  BP Method Automatic  Patient Position (if appropriate) Lying  Oxygen Therapy  SpO2 97 %  O2 Device Room Air  Pain Assessment  Pain Scale 0-10  Pain Score 0  During Hemodialysis Assessment  Blood Flow Rate (mL/min) 300 mL/min  Arterial Pressure (mmHg) -90 mmHg  Venous Pressure (mmHg) 70 mmHg  Transmembrane Pressure (mmHg) 60 mmHg  Ultrafiltration Rate (mL/min) 570 mL/min  Dialysate Flow Rate (mL/min) 800 ml/min  Conductivity: Machine  14.1  HD Safety Checks Performed Yes  Intra-Hemodialysis Comments Tx initiated  Hemodialysis Catheter Right Subclavian Double lumen Permanent (Tunneled)  Placement Date/Time: 04/02/19 1200   Placed prior to admission: No  Person Inserting Catheter: Dr. [name]  Orientation: Right  Access Location: Subclavian  Hemodialysis Catheter Type: Double lumen Permanent (Tunneled)  Site Condition No complications  Blue Lumen Status Blood return noted  Red Lumen Status Blood return noted  Purple Lumen Status N/A  Dressing Type Biopatch;Occlusive  Dressing Status Clean;Dry;Intact  Drainage Description None

## 2019-04-19 NOTE — Progress Notes (Signed)
Established hemodialysis patient known at [facility] TTS 10:45, transported by self or girlfriend. Please note any change in Covid and mobility may affect this plan. Please contact me directly with any dialysis placement concerns.  Elvera Bicker Dialysis Coordinator [phone]

## 2019-04-19 NOTE — Progress Notes (Addendum)
Discussed discharge instructions and medications with patient.  Patient educated on carb modified diet. IV removed. All questions addressed. Patient transported home via car by his family.  Clarise Cruz, BSN

## 2019-04-19 NOTE — Progress Notes (Signed)
Post HD Tx Assessment:   04/19/19 1345  Neurological  Level of Consciousness Alert  Orientation Level Oriented X4  Cardiac  Pulse Regular  Heart Sounds S1, S2  Vascular  R Radial Pulse +2  L Radial Pulse +2  Psychosocial  Psychosocial (WDL) WDL

## 2019-04-19 NOTE — Progress Notes (Signed)
HD Tx Completed:    04/19/19 1330  Vital Signs  Temp 99 F (37.2 C)  Temp Source Oral  Pulse Rate 80  Pulse Rate Source Dinamap  Resp 13  BP (!) 179/91  BP Location Right Arm  BP Method Automatic  Patient Position (if appropriate) Lying  Oxygen Therapy  SpO2 98 %  O2 Device Room Air  Pain Assessment  Pain Scale 0-10  Pain Score 0  During Hemodialysis Assessment  Blood Flow Rate (mL/min) 400 mL/min  Arterial Pressure (mmHg) -150 mmHg  Venous Pressure (mmHg) 140 mmHg  Transmembrane Pressure (mmHg) 60 mmHg  Ultrafiltration Rate (mL/min) 550 mL/min  Dialysate Flow Rate (mL/min) 800 ml/min  Conductivity: Machine  14  HD Safety Checks Performed Yes  Intra-Hemodialysis Comments Tx completed

## 2019-04-19 NOTE — Progress Notes (Signed)
HD Tx Reinitiated:   04/19/19 1115  Vital Signs  Temp 98.4 F (36.9 C)  Temp Source Oral  Pulse Rate 85  Pulse Rate Source Dinamap  Resp 15  BP (!) 165/81  BP Location Left Arm  BP Method Automatic  Patient Position (if appropriate) Lying  Oxygen Therapy  SpO2 95 %  O2 Device Room Air  Pain Assessment  Pain Scale 0-10  Pain Score 0  During Hemodialysis Assessment  Blood Flow Rate (mL/min) 400 mL/min  Arterial Pressure (mmHg) -110 mmHg  Venous Pressure (mmHg) 120 mmHg  Transmembrane Pressure (mmHg) 50 mmHg  Ultrafiltration Rate (mL/min) 550 mL/min  Dialysate Flow Rate (mL/min) 800 ml/min  Conductivity: Machine  14  HD Safety Checks Performed Yes  Intra-Hemodialysis Comments Tx initiated
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
## 2019-04-19 NOTE — Progress Notes (Signed)
Pre HD Note:    04/19/19 0855  Vital Signs  Temp 98.5 F (36.9 C)  Temp Source Oral  Pulse Rate 78  Resp 16  BP (!) 185/97  BP Location Left Arm  BP Method Automatic  Patient Position (if appropriate) Lying  Oxygen Therapy  SpO2 96 %  O2 Device Room Air  Pain Assessment  Pain Scale 0-10  Pain Score 0  Machine Checks  Machine Number 2  Station Number 1  UF/Alarm Test Passed  Conductivity: Meter 13.8  Conductivity: Machine  14  pH 7.6  Reverse Osmosis main  Normal Saline Lot Number [id number]  Dialyzer Lot Number [id number]  Disposable Set Lot Number [id number]  Machine Temperature 98.6 F (37 C)  Musician and Audible Yes  Blood Lines Intact and Secured Yes  Pre Treatment Patient Checks  Vascular access used during treatment Catheter  HD catheter dressing before treatment WDL  Hepatitis B Surface Antigen Results Negative  Date Hepatitis B Surface Antigen Drawn 04/02/19  Hepatitis B Surface Antibody  (<10)  Date Hepatitis B Surface Antibody Drawn 04/02/19  Hemodialysis Consent Verified Yes  Hemodialysis Standing Orders Initiated Yes  ECG (Telemetry) Monitor On Yes  Prime Ordered Normal Saline  Length of  DialysisTreatment -hour(s) 3.5 Hour(s)  Dialysis Treatment Comments  (Na 140)  Dialyzer Elisio 17H NR  Dialysate 2K;2.5 Ca  Dialysate Flow Ordered 800  Blood Flow Rate Ordered 400 mL/min  Ultrafiltration Goal 1.5 Liters  Dialysis Blood Pressure Support Ordered Normal Saline  Hemodialysis Catheter Right Subclavian Double lumen Permanent (Tunneled)  Placement Date/Time: 04/02/19 1200   Placed prior to admission: No  Person Inserting Catheter: Dr. [name]  Orientation: Right  Access Location: Subclavian  Hemodialysis Catheter Type: Double lumen Permanent (Tunneled)  Site Condition No complications  Blue Lumen Status Blood return noted  Red Lumen Status Blood return noted  Purple Lumen Status N/A  Dressing Type Biopatch;Occlusive  Dressing Status  Dry;Intact;Clean  Drainage Description None

## 2019-04-19 NOTE — Progress Notes (Signed)
[facility]  ROUNDING NOTE   Subjective:  Patient seen and evaluated during dialysis treatment. Tolerating well.    HEMODIALYSIS FLOWSHEET:  Blood Flow Rate (mL/min): 400 mL/min Arterial Pressure (mmHg): -160 mmHg Venous Pressure (mmHg): 150 mmHg Transmembrane Pressure (mmHg): 50 mmHg Conductivity: Machine : 15.4 Conductivity: Machine : 15.4   Objective:  Vital signs in last 24 hours:  Temp:  [98.2 F (36.8 C)-98.6 F (37 C)] 98.4 F (36.9 C) (08/13 1028) Pulse Rate:  [75-86] 83 (08/13 1130) Resp:  [0-30] 19 (08/13 1130) BP: (162-189)/(81-102) 180/88 (08/13 1130) SpO2:  [93 %-100 %] 99 % (08/13 1130)  Weight change:  Filed Weights   04/17/19 1750 04/17/19 2233  Weight: 136.1 kg 128 kg    Intake/Output: I/O last 3 completed shifts: In: 2787.2 [P.O.:600; I.V.:2187.2] Out: 600 [Urine:600]   Intake/Output this shift:  Total I/O In: -  Out: 400 [Urine:400]  Physical Exam: General: No acute distress  Head: Normocephalic, atraumatic. Moist oral mucosal membranes  Eyes: Anicteric  Neck: Supple, trachea midline  Lungs:  Clear to auscultation, normal effort  Heart: regular  Abdomen:  Soft, nontender, bowel sounds present, obese  Extremities: trace peripheral edema.  Neurologic: Awake, alert, following commands  Skin: No lesions  Access:  Right internal jugular PermCath    Basic Metabolic Panel: Recent Labs  Lab 04/17/19 1746 04/18/19 0506 04/19/19 0535  NA 133* 133* 135  K 2.6* 3.0* 3.5  CL 98 100 99  CO2 $Re'27 27 25  'Iek$ GLUCOSE 49* 77 116*  BUN 14 16 21*  CREATININE 2.69* 3.14* 3.92*  CALCIUM 8.6* 8.6* 9.1    Liver Function Tests: Recent Labs  Lab 04/17/19 1746  AST 23  ALT 18  ALKPHOS 126  BILITOT 0.4  PROT 8.3*  ALBUMIN 3.1*   No results for input(s): LIPASE, AMYLASE in the last 168 hours. No results for input(s): AMMONIA in the last 168 hours.  CBC: Recent Labs  Lab 04/17/19 1746 04/18/19 0506  WBC 5.6 5.4  NEUTROABS 3.7   --   HGB 8.3* 7.5*  HCT 27.5* 25.0*  MCV 79.7* 78.4*  PLT 335 323    Cardiac Enzymes: No results for input(s): CKTOTAL, CKMB, CKMBINDEX, TROPONINI in the last 168 hours.  BNP: Invalid input(s): POCBNP  CBG: Recent Labs  Lab 04/19/19 0001 04/19/19 0203 04/19/19 0337 04/19/19 0538 04/19/19 0741  GLUCAP 139* 124* 124* 117* 102*    Microbiology: Results for orders placed or performed during the hospital encounter of 04/17/19  SARS CORONAVIRUS 2 Nasal Swab Aptima Multi Swab     Status: None   Collection Time: 04/17/19  7:44 PM   Specimen: Aptima Multi Swab; Nasal Swab  Result Value Ref Range Status   SARS Coronavirus 2 NEGATIVE NEGATIVE Final    Comment: (NOTE) SARS-CoV-2 target nucleic acids are NOT DETECTED. The SARS-CoV-2 RNA is generally detectable in upper and lower respiratory specimens during the acute phase of infection. Negative results do not preclude SARS-CoV-2 infection, do not rule out co-infections with other pathogens, and should not be used as the sole basis for treatment or other patient management decisions. Negative results must be combined with clinical observations, patient history, and epidemiological information. The expected result is Negative. Fact Sheet for Patients: SugarRoll.be Fact Sheet for Healthcare Providers: https://www.woods-mathews.com/ This test is not yet approved or cleared by the [place] FDA and  has been authorized for detection and/or diagnosis of SARS-CoV-2 by FDA under an Emergency Use Authorization (EUA). This EUA will remain  in effect (meaning this test can be used) for the duration of the COVID-19 declaration under Section 56 4(b)(1) of the Act, 21 U.S.C. section 360bbb-3(b)(1), unless the authorization is terminated or revoked sooner. Performed at [facility], [place] [address], [place], [place] [address]     Coagulation Studies: No results for input(s): LABPROT,  INR in the last 72 hours.  Urinalysis: No results for input(s): COLORURINE, LABSPEC, PHURINE, GLUCOSEU, HGBUR, BILIRUBINUR, KETONESUR, PROTEINUR, UROBILINOGEN, NITRITE, LEUKOCYTESUR in the last 72 hours.  Invalid input(s): APPERANCEUR    Imaging: No results found.   Medications:    . amLODipine  10 mg Oral Daily  . aspirin EC  81 mg Oral Daily  . Chlorhexidine Gluconate Cloth  6 each Topical Q0600  . heparin  5,000 Units Subcutaneous Q8H  . labetalol  200 mg Oral BID  . sertraline  100 mg Oral Daily   acetaminophen, baclofen, docusate sodium  Assessment/ Plan:   [name] is a 58 y.o. black male with anxiety, diabetes mellitus type 2, hypertension, morbid obesity,  admitted with hypoglycemia.  1. 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  Secondary to diabetic nephropathy.  Requiring hemodialysis. First treatment on 7/27. -Patient seen and evaluated during dialysis treatment.  Tolerating well.  We plan to complete dialysis treatment today.  2.    Hypertension.  Blood pressure still high at 180/88.  Patient maintained on amlodipine and labetalol.  Add losartan 50 mg daily to his medication regimen.  3.  Anemia of chronic kidney disease:   Start the patient on Epogen 10,000 units IV with dialysis.  4.  Secondary hyperparathyroidism:  Check serum phosphorus with dialysis treatment today.   LOS: 2 Steve Vance 8/13/202011:32 AM

## 2019-04-19 NOTE — Progress Notes (Signed)
Pre HD Assessment:    04/19/19 0900  Neurological  Level of Consciousness Alert  Orientation Level Oriented X4  Respiratory  Respiratory Pattern Regular;Unlabored  Chest Assessment Chest expansion symmetrical  Bilateral Breath Sounds Clear;Diminished  L Lower Breath Sounds Diminished  Cardiac  Pulse Regular  Heart Sounds S1, S2  ECG Monitor No  Antiarrhythmic device No  Vascular  R Radial Pulse +2  L Radial Pulse +2  RLE Edema +1  LLE Edema +1  Integumentary  Skin Color Ashen  Skin Condition Flaky;Dry  Psychosocial  Psychosocial (WDL) WDL

## 2019-05-07 ENCOUNTER — Emergency Department
Admission: EM | Admit: 2019-05-07 | Discharge: 2019-05-07 | Disposition: A | Discharge status: Home / Self Care | Payer: No Typology Code available for payment source | Attending: Emergency Medicine | Admitting: Emergency Medicine

## 2019-05-07 ENCOUNTER — Other Ambulatory Visit: Payer: Self-pay

## 2019-05-07 DIAGNOSIS — E1122 Type 2 diabetes mellitus with diabetic chronic kidney disease: Secondary | ICD-10-CM | POA: Insufficient documentation

## 2019-05-07 DIAGNOSIS — S39012D Strain of muscle, fascia and tendon of lower back, subsequent encounter: Secondary | ICD-10-CM | POA: Diagnosis present

## 2019-05-07 DIAGNOSIS — Z79899 Other long term (current) drug therapy: Secondary | ICD-10-CM | POA: Diagnosis not present

## 2019-05-07 DIAGNOSIS — G8929 Other chronic pain: Secondary | ICD-10-CM

## 2019-05-07 DIAGNOSIS — N189 Chronic kidney disease, unspecified: Secondary | ICD-10-CM | POA: Diagnosis not present

## 2019-05-07 DIAGNOSIS — I129 Hypertensive chronic kidney disease with stage 1 through stage 4 chronic kidney disease, or unspecified chronic kidney disease: Secondary | ICD-10-CM | POA: Diagnosis not present

## 2019-05-07 MED ORDER — HYDROCODONE-ACETAMINOPHEN 5-325 MG PO TABS
1.0000 | ORAL_TABLET | Freq: Once | ORAL | Status: AC
  Administered 2019-05-07: 13:00:00 1 via ORAL
  Filled 2019-05-07: qty 1

## 2019-05-07 MED ORDER — ONDANSETRON 4 MG PO TBDP
4.0000 mg | ORAL_TABLET | Freq: Once | ORAL | Status: AC
  Administered 2019-05-07: 4 mg via ORAL
  Filled 2019-05-07: qty 1
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
## 2019-05-07 NOTE — ED Notes (Signed)

## 2019-05-09 ENCOUNTER — Other Ambulatory Visit: Payer: Self-pay | Admitting: Family Medicine

## 2019-05-09 ENCOUNTER — Other Ambulatory Visit: Payer: Self-pay

## 2019-05-09 ENCOUNTER — Ambulatory Visit
Admission: RE | Admit: 2019-05-09 | Discharge: 2019-05-09 | Disposition: A | Discharge status: Home / Self Care | Payer: Medicare Other | Source: Ambulatory Visit | Attending: Family Medicine | Admitting: Family Medicine

## 2019-05-09 DIAGNOSIS — M546 Pain in thoracic spine: Secondary | ICD-10-CM
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
## 2019-05-10 ENCOUNTER — Other Ambulatory Visit: Payer: Self-pay | Admitting: Family Medicine

## 2019-05-10 DIAGNOSIS — M545 Low back pain, unspecified: Secondary | ICD-10-CM

## 2019-05-25 ENCOUNTER — Other Ambulatory Visit: Payer: Self-pay

## 2019-05-25 ENCOUNTER — Ambulatory Visit
Admission: RE | Admit: 2019-05-25 | Discharge: 2019-05-25 | Disposition: A | Discharge status: Home / Self Care | Payer: Medicare Other | Source: Ambulatory Visit | Attending: Family Medicine | Admitting: Family Medicine

## 2019-05-25 DIAGNOSIS — M545 Low back pain, unspecified: Secondary | ICD-10-CM

## 2019-05-25 IMAGING — MR MR LUMBAR SPINE W/O CM
5 series · 30 of 48 positions shown · non-contrast
Comparison: [DATE]

CLINICAL DATA: Low back pain and bilateral hip pain

EXAM:
MRI LUMBAR SPINE WITHOUT CONTRAST
TECHNIQUE: Multiplanar, multisequence MR imaging of the lumbar spine was
performed. No intravenous contrast was administered.

[Series 5: T2 · sagittal · 4.0mm · 0.81mm/px · 6 of 17 slices shown (1 of 2)]
[im 1/17]
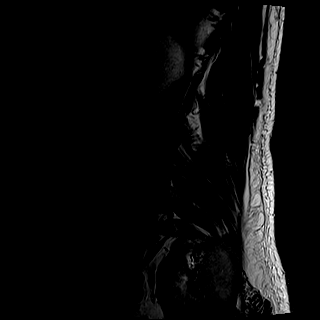
[im 4/17]
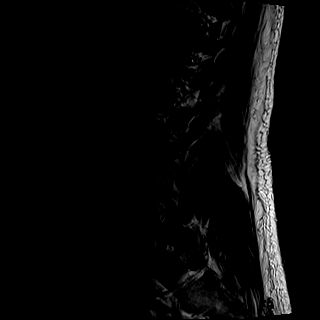
[im 7/17]
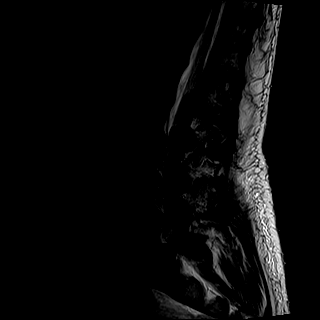
[im 10/17]
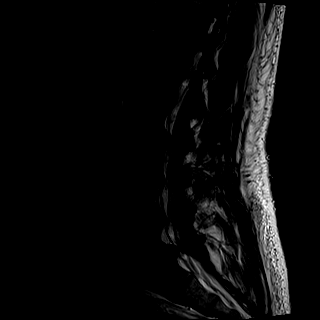
[im 13/17]
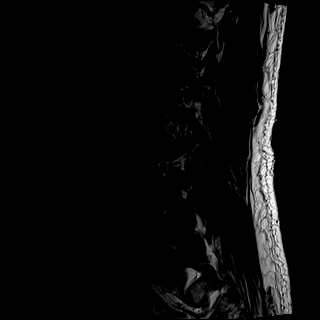
[im 17/17]
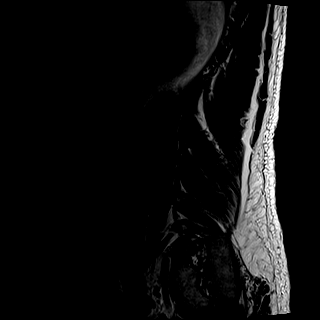

[Series 6: T1 · sagittal · 4.0mm · 0.81mm/px · 7 of 17 slices shown (1 of 2)]
[im 1/17]
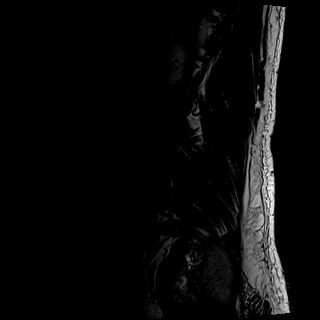
[im 3/17]
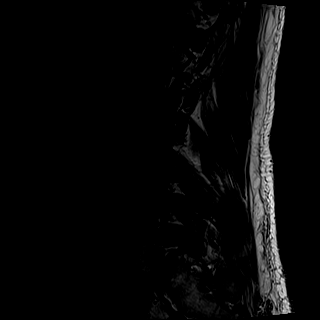
[im 6/17]
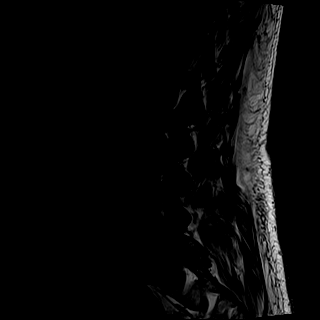
[im 9/17]
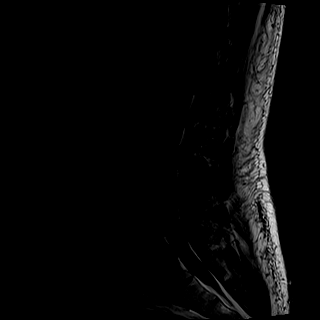
[im 11/17]
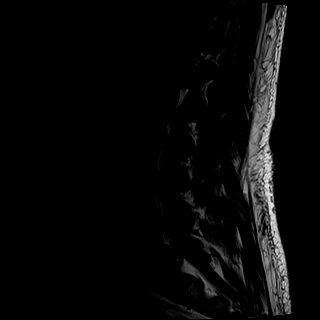
[im 14/17]
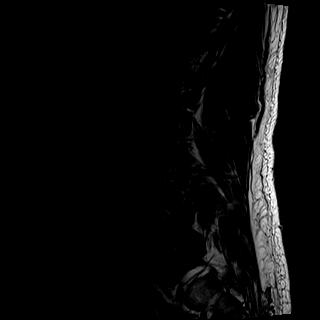
[im 17/17]
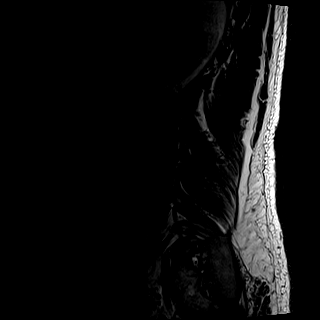

[Series 7: STIR · sagittal · 4.0mm · 0.41mm/px · 1 of 17 slices shown]
[im 1/17]
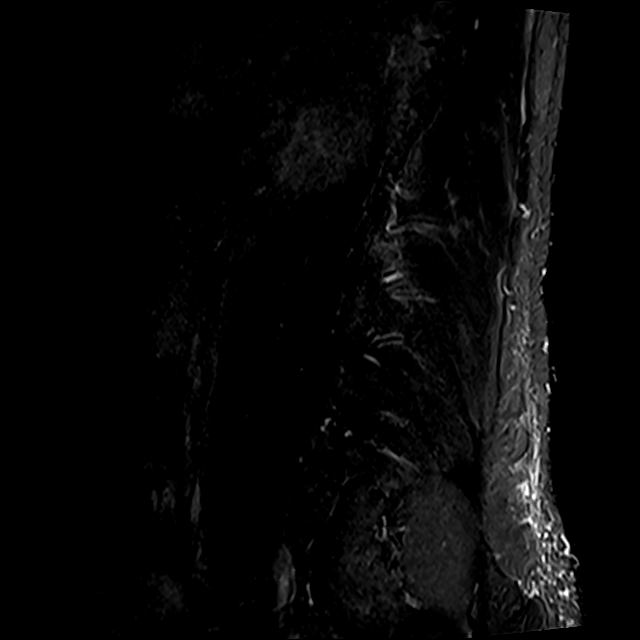

[Series 8: T2 · axial · 4.0mm · 0.78mm/px · z∈[-31,+182]mm · 8 of 36 slices shown (2 of 2)]
[im 1/36]
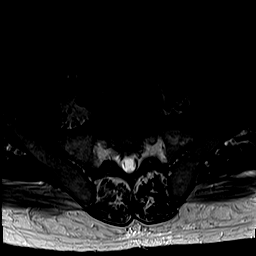
[im 6/36]
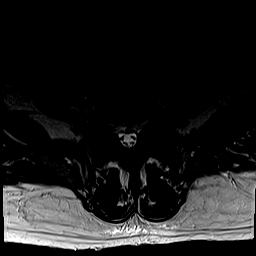
[im 11/36]
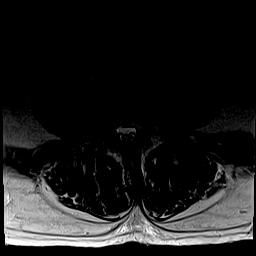
[im 17/36]
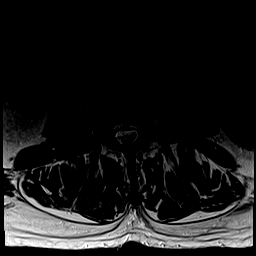
[im 19/36]
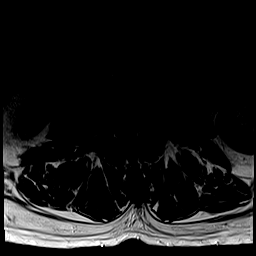
[im 25/36]
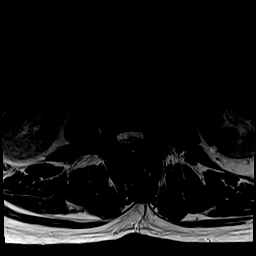
[im 30/36]
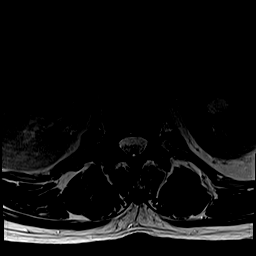
[im 36/36]
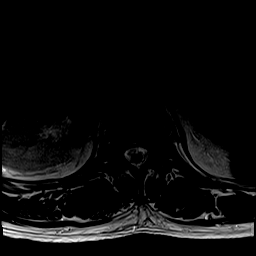

[Series 9: T1 · axial · 4.0mm · 0.39mm/px · z∈[-31,+182]mm · 8 of 36 slices shown (2 of 2)]
[im 1/36]
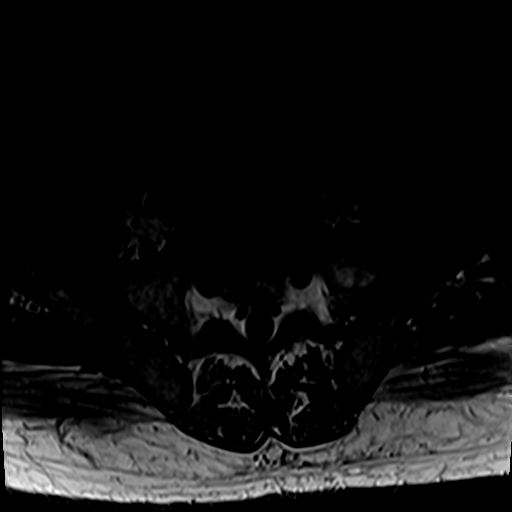
[im 6/36]
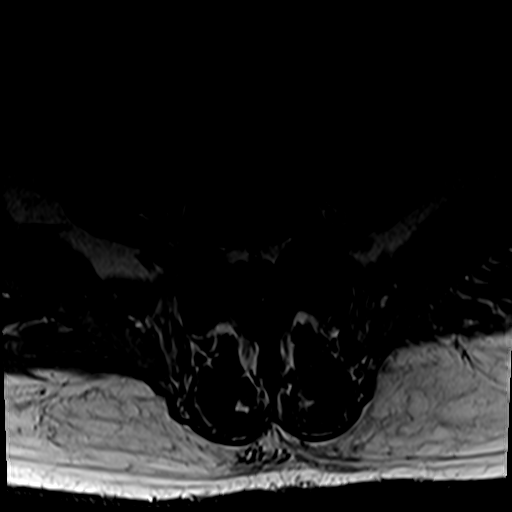
[im 11/36]
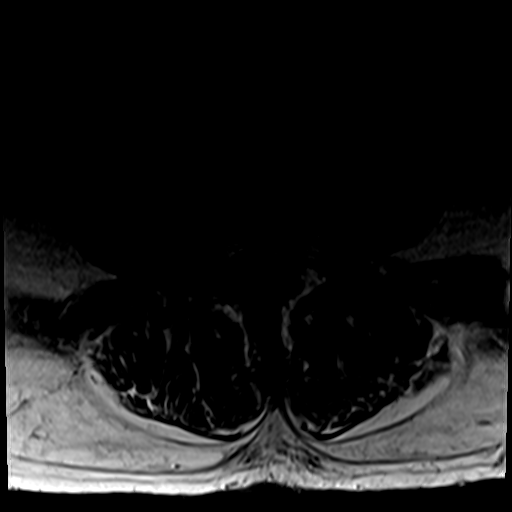
[im 17/36]
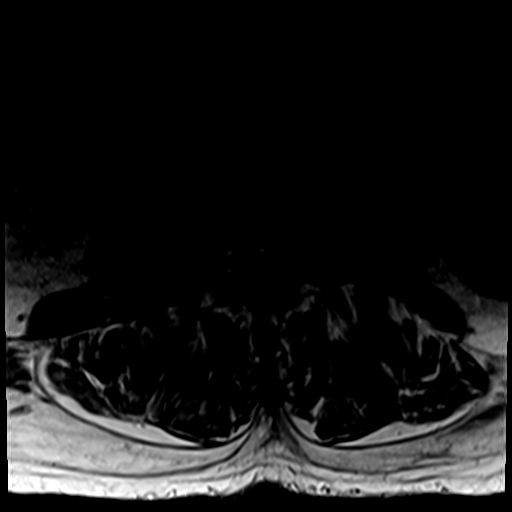
[im 19/36]
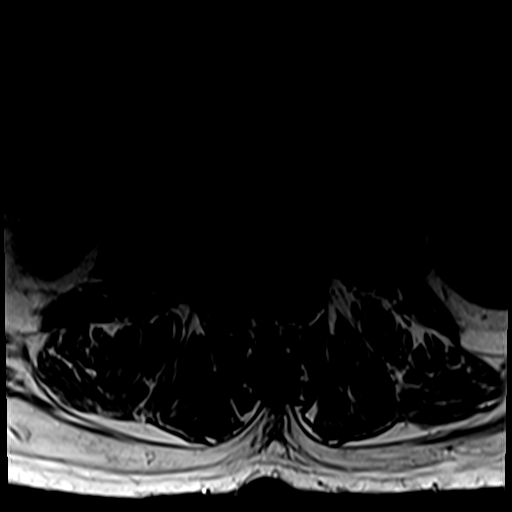
[im 25/36]
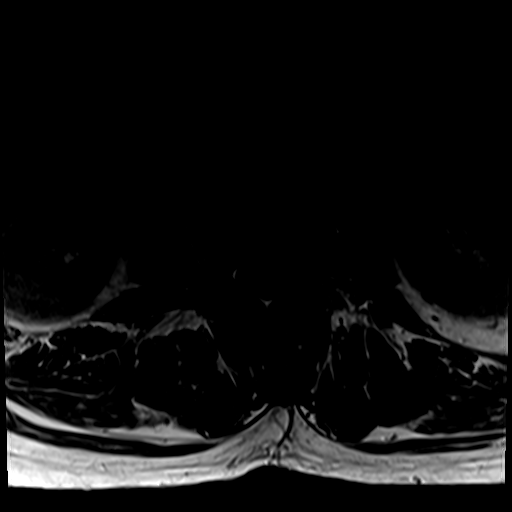
[im 30/36]
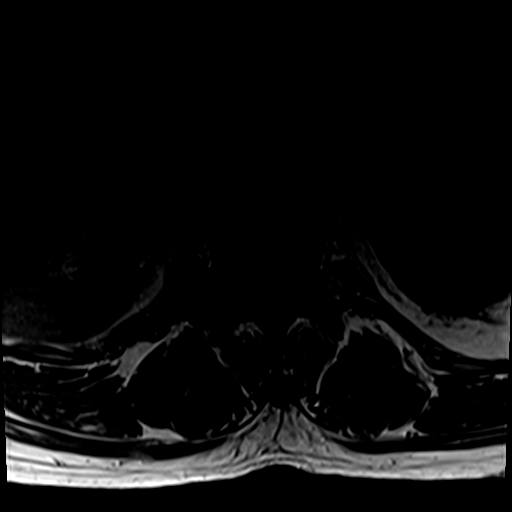
[im 36/36]
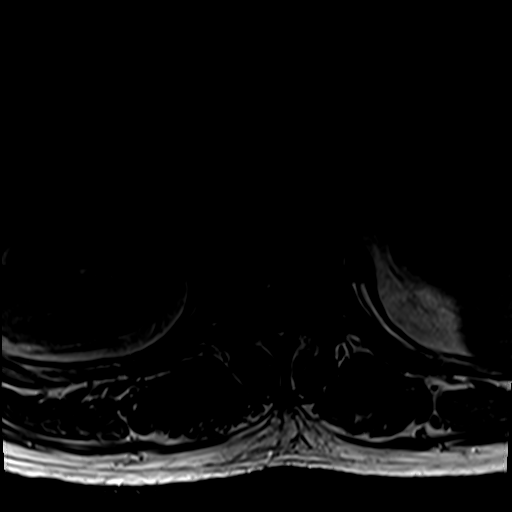

[30 of 48 positions shown; findings below may reference images not displayed]

FINDINGS: Segmentation:  Standard.

Alignment:  Physiologic.

Vertebrae: No fracture, evidence of discitis, or bone lesion. Mild
scattered discogenic endplate marrow changes.

Conus medullaris and cauda equina: Conus extends to the L1 level.
Conus and cauda equina appear normal.

Paraspinal and other soft tissues: Cortically based T2 hyperintense
lesionswithin the bilateral kidneys, incompletely characterized, but
most likely represent cysts. Additional ovoid 11 mm T2 hypointense
focus within the inferior pole of the left kidney, nonspecific.

Disc levels:

T12-L1: Mild disc bulge.  No foraminal or canal stenosis.

L1-L2: No significant disc protrusion. Mild bilateral facet
arthrosis. No foraminal or canal stenosis.

L2-L3: Diffuse disc bulge with mild bilateral facet arthrosis and
buckling of the ligamentum flavum result in mild left foraminal
stenosis and moderate to severe canal stenosis.

L3-L4: Diffuse disc bulge with mild bilateral facet arthrosis and
ligamentum flavum buckling results in moderate canal stenosis
without foraminal stenosis.

L4-L5: Diffuse disc bulge with bilateral facet arthrosis and
ligamentum flavum buckling results in moderate canal stenosis
without foraminal stenosis.

L5-S1: Diffuse disc bulge with advanced bilateral facet arthrosis
and ligamentum flavum buckling resulting in moderate bilateral
foraminal stenosis and moderate canal stenosis.
IMPRESSION: 1. Multilevel lumbar spondylosis, most pronounced at the L2-3 level
where there is moderate-to-severe canal stenosis and mild left
foraminal stenosis.
2. There is also moderate canal stenosis at the L3-4, L4-5, and
L5-S1 levels.
3. Moderate bilateral foraminal stenosis at L5-S1.
4. Likely bilateral renal cysts with 11 mm low T2 signal intensity
focus within the inferior pole of the left kidney which could
represent a renal calculus or complex contents within a cyst. A
small solid lesion is not excluded. This finding was not clearly
delineated on recent renal ultrasound [DATE]. Consider further
evaluation with renal protocol CT.

## 2019-05-31 ENCOUNTER — Other Ambulatory Visit (INDEPENDENT_AMBULATORY_CARE_PROVIDER_SITE_OTHER): Payer: Self-pay | Admitting: Vascular Surgery

## 2019-05-31 DIAGNOSIS — N186 End stage renal disease: Secondary | ICD-10-CM

## 2019-06-01 ENCOUNTER — Other Ambulatory Visit (INDEPENDENT_AMBULATORY_CARE_PROVIDER_SITE_OTHER): Payer: Self-pay | Admitting: Vascular Surgery

## 2019-06-01 DIAGNOSIS — N186 End stage renal disease: Secondary | ICD-10-CM

## 2019-06-04 ENCOUNTER — Encounter (INDEPENDENT_AMBULATORY_CARE_PROVIDER_SITE_OTHER): Payer: Self-pay | Admitting: Vascular Surgery

## 2019-06-04 ENCOUNTER — Ambulatory Visit (INDEPENDENT_AMBULATORY_CARE_PROVIDER_SITE_OTHER): Payer: Medicare Other

## 2019-06-04 ENCOUNTER — Other Ambulatory Visit: Payer: Self-pay

## 2019-06-04 ENCOUNTER — Ambulatory Visit (INDEPENDENT_AMBULATORY_CARE_PROVIDER_SITE_OTHER): Payer: Medicare Other | Admitting: Vascular Surgery

## 2019-06-04 VITALS — BP 181/87 | HR 75 | Resp 16 | Wt 247.6 lb

## 2019-06-04 DIAGNOSIS — I10 Essential (primary) hypertension: Secondary | ICD-10-CM

## 2019-06-04 DIAGNOSIS — N186 End stage renal disease: Secondary | ICD-10-CM | POA: Diagnosis not present

## 2019-06-04 DIAGNOSIS — E1165 Type 2 diabetes mellitus with hyperglycemia: Secondary | ICD-10-CM

## 2019-06-04 DIAGNOSIS — E782 Mixed hyperlipidemia: Secondary | ICD-10-CM
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
## 2019-06-07 DIAGNOSIS — Z9289 Personal history of other medical treatment: Secondary | ICD-10-CM

## 2019-06-07 HISTORY — DX: Personal history of other medical treatment: Z92.89

## 2019-06-08 ENCOUNTER — Ambulatory Visit (INDEPENDENT_AMBULATORY_CARE_PROVIDER_SITE_OTHER): Payer: Medicare Other | Admitting: Cardiology

## 2019-06-08 ENCOUNTER — Other Ambulatory Visit: Payer: Self-pay

## 2019-06-08 ENCOUNTER — Encounter: Payer: Self-pay | Admitting: Cardiology

## 2019-06-08 VITALS — BP 140/80 | HR 73 | Temp 98.2°F | Ht 71.0 in | Wt 242.8 lb

## 2019-06-08 DIAGNOSIS — I10 Essential (primary) hypertension: Secondary | ICD-10-CM | POA: Diagnosis not present

## 2019-06-08 DIAGNOSIS — Z01818 Encounter for other preprocedural examination: Secondary | ICD-10-CM

## 2019-06-08 NOTE — Patient Instructions (Signed)
Medication Instructions:  Your physician recommends that you continue on your current medications as directed. Please refer to the Current Medication list given to you today.  If you need a refill on your cardiac medications before your next appointment, please call your pharmacy.   Lab work: None ordered  If you have labs (blood work) drawn today and your tests are completely normal, you will receive your results only by: Marland Kitchen MyChart Message (if you have MyChart) OR . A paper copy in the mail If you have any lab test that is abnormal or we need to change your treatment, we will call you to review the results.  Testing/Procedures: None ordered   Follow-Up: At [facility], you and your health needs are our priority.  As part of our continuing mission to provide you with exceptional heart care, we have created designated Provider Care Teams.  These Care Teams include your primary Cardiologist (physician) and Advanced Practice Providers (APPs -  Physician Assistants and Nurse Practitioners) who all work together to provide you with the care you need, when you need it. You will need a follow up appointment as needed.
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
## 2019-06-12 ENCOUNTER — Telehealth (INDEPENDENT_AMBULATORY_CARE_PROVIDER_SITE_OTHER): Payer: Self-pay

## 2019-06-12 NOTE — Telephone Encounter (Signed)
Spoke with the patient and he is now scheduled with Dr. Delana Meyer for surgery on 06/22/2019 at the [facility]. Patient will do his Covid testing on 06/18/2019 at 2:30 pm at the [facility]. Pre-procedure instructions will be faxed to the patient's dialysis center at his request and I will mail to his home.

## 2019-06-13 DIAGNOSIS — N186 End stage renal disease: Secondary | ICD-10-CM | POA: Insufficient documentation

## 2019-06-13 DIAGNOSIS — E785 Hyperlipidemia, unspecified: Secondary | ICD-10-CM | POA: Insufficient documentation

## 2019-06-14 ENCOUNTER — Ambulatory Visit
Admission: RE | Admit: 2019-06-14 | Discharge: 2019-06-14 | Disposition: A | Discharge status: Home / Self Care | Payer: Medicare Other | Source: Ambulatory Visit | Attending: Chiropractor | Admitting: Chiropractor

## 2019-06-14 ENCOUNTER — Other Ambulatory Visit: Payer: Self-pay | Admitting: Chiropractor

## 2019-06-14 DIAGNOSIS — M50322 Other cervical disc degeneration at C5-C6 level: Secondary | ICD-10-CM | POA: Insufficient documentation
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
## 2019-06-17 ENCOUNTER — Other Ambulatory Visit (INDEPENDENT_AMBULATORY_CARE_PROVIDER_SITE_OTHER): Payer: Self-pay | Admitting: Nurse Practitioner

## 2019-06-18 ENCOUNTER — Telehealth (INDEPENDENT_AMBULATORY_CARE_PROVIDER_SITE_OTHER): Payer: Self-pay

## 2019-06-18 ENCOUNTER — Other Ambulatory Visit: Admission: RE | Admit: 2019-06-18 | Payer: Medicare Other | Source: Ambulatory Visit

## 2019-06-18 NOTE — Telephone Encounter (Signed)
Spoke with the patient and he is aware that his surgery has been rescheduled to 06/27/2019 with Dr. Delana Meyer. Patient will keep the appt to do his pre-op today but will do his Covid testing on 06/22/2019 between 12:30-2:30 pm at the [facility]. New pre-surgical instructions will be mailed to the patient.

## 2019-06-19 ENCOUNTER — Other Ambulatory Visit: Payer: Medicare Other

## 2019-06-20 NOTE — Telephone Encounter (Signed)
Spoke with the patient and gave him the time for his pre-op and Covid test on 06/22/2019 with a arrival time of 8:15 am to the [facility].

## 2019-06-22 ENCOUNTER — Other Ambulatory Visit: Admission: RE | Admit: 2019-06-22 | Payer: Medicare Other | Source: Ambulatory Visit

## 2019-06-22 ENCOUNTER — Other Ambulatory Visit: Payer: Self-pay

## 2019-06-22 ENCOUNTER — Other Ambulatory Visit
Admission: RE | Admit: 2019-06-22 | Discharge: 2019-06-22 | Disposition: A | Discharge status: Home / Self Care | Payer: Medicare Other | Source: Ambulatory Visit | Attending: Vascular Surgery | Admitting: Vascular Surgery

## 2019-06-22 ENCOUNTER — Encounter: Payer: Self-pay | Admitting: *Deleted

## 2019-06-22 ENCOUNTER — Other Ambulatory Visit: Payer: Medicare Other

## 2019-06-22 DIAGNOSIS — I120 Hypertensive chronic kidney disease with stage 5 chronic kidney disease or end stage renal disease: Secondary | ICD-10-CM | POA: Insufficient documentation

## 2019-06-22 DIAGNOSIS — N186 End stage renal disease: Secondary | ICD-10-CM | POA: Insufficient documentation

## 2019-06-22 DIAGNOSIS — Z01812 Encounter for preprocedural laboratory examination: Secondary | ICD-10-CM | POA: Insufficient documentation

## 2019-06-22 DIAGNOSIS — E1122 Type 2 diabetes mellitus with diabetic chronic kidney disease: Secondary | ICD-10-CM | POA: Diagnosis not present

## 2019-06-22 DIAGNOSIS — Z20828 Contact with and (suspected) exposure to other viral communicable diseases: Secondary | ICD-10-CM | POA: Insufficient documentation

## 2019-06-22 HISTORY — DX: Unspecified osteoarthritis, unspecified site: M19.90

## 2019-06-22 LAB — CBC WITH DIFFERENTIAL/PLATELET
Abs Immature Granulocytes: 0.01 10*3/uL (ref 0.00–0.07)
Basophils Absolute: 0 10*3/uL (ref 0.0–0.1)
Basophils Relative: 1 %
Eosinophils Absolute: 0.3 10*3/uL (ref 0.0–0.5)
Eosinophils Relative: 5 %
HCT: 36.3 % — ABNORMAL LOW (ref 39.0–52.0)
Hemoglobin: 11 g/dL — ABNORMAL LOW (ref 13.0–17.0)
Immature Granulocytes: 0 %
Lymphocytes Relative: 26 %
Lymphs Abs: 1.5 10*3/uL (ref 0.7–4.0)
MCH: 24.6 pg — ABNORMAL LOW (ref 26.0–34.0)
MCHC: 30.3 g/dL (ref 30.0–36.0)
MCV: 81.2 fL (ref 80.0–100.0)
Monocytes Absolute: 0.6 10*3/uL (ref 0.1–1.0)
Monocytes Relative: 10 %
Neutro Abs: 3.5 10*3/uL (ref 1.7–7.7)
Neutrophils Relative %: 58 %
Platelets: 303 10*3/uL (ref 150–400)
RBC: 4.47 MIL/uL (ref 4.22–5.81)
RDW: 18.9 % — ABNORMAL HIGH (ref 11.5–15.5)
WBC: 5.9 10*3/uL (ref 4.0–10.5)
nRBC: 0 % (ref 0.0–0.2)

## 2019-06-22 LAB — BASIC METABOLIC PANEL
Anion gap: 11 (ref 5–15)
BUN: 28 mg/dL — ABNORMAL HIGH (ref 6–20)
CO2: 27 mmol/L (ref 22–32)
Calcium: 9.4 mg/dL (ref 8.9–10.3)
Chloride: 99 mmol/L (ref 98–111)
Creatinine, Ser: 4.65 mg/dL — ABNORMAL HIGH (ref 0.61–1.24)
GFR calc Af Amer: 15 mL/min — ABNORMAL LOW (ref >60)
GFR calc non Af Amer: 13 mL/min — ABNORMAL LOW (ref >60)
Glucose, Bld: 139 mg/dL — ABNORMAL HIGH (ref 70–99)
Potassium: 3.4 mmol/L — ABNORMAL LOW (ref 3.5–5.1)
Sodium: 137 mmol/L (ref 135–145)

## 2019-06-22 LAB — PROTIME-INR
INR: 1.1 (ref 0.8–1.2)
Prothrombin Time: 14.3 seconds (ref 11.4–15.2)

## 2019-06-22 LAB — TYPE AND SCREEN
ABO/RH(D): AB POS
Antibody Screen: NEGATIVE
Extend sample reason: TRANSFUSED

## 2019-06-22 LAB — APTT: aPTT: 34 seconds (ref 24–36)

## 2019-06-22 LAB — SARS CORONAVIRUS 2 (TAT 6-24 HRS): SARS Coronavirus 2: NEGATIVE

## 2019-06-22 NOTE — Patient Instructions (Signed)
INSTRUCTIONS FOR SURGERY     Your surgery is scheduled for:   Wednesday, October 21ST     To find out your arrival time for the day of surgery,          please call [phone] between 1 pm and 3 pm on :  Tuesday, October 20TH     When you arrive for surgery, report to the [facility].       Do NOT stop on the first floor to register.    REMEMBER: Instructions that are not followed completely may result in serious medical risk,  up to and including death, or upon the discretion of your surgeon and anesthesiologist,            your surgery may need to be rescheduled.  __X__ 1. Do not eat food after midnight the night before your procedure.                    No gum, candy, lozenger, tic tacs, tums or hard candies.                  ABSOLUTELY NOTHING SOLID IN YOUR MOUTH AFTER MIDNIGHT                    You may drink unlimited clear liquids up to 2 hours before you are scheduled to arrive for surgery.                   Do not drink anything within those 2 hours unless you need to take medicine, then take the                   smallest amount you need.  Clear liquids include:  water, apple juice without pulp,                   any flavor Gatorade, Black coffee, black tea.  Sugar may be added but no dairy/ honey /lemon.                        Broth and jello is not considered a clear liquid.  __x__  2. On the morning of surgery, please brush your teeth with toothpaste and water. You may rinse with                  mouthwash if you wish but DO NOT SWALLOW TOOTHPASTE OR MOUTHWASH  __X___3. NO alcohol for 24 hours before or after surgery.  __x___ 4.  Do NOT smoke or use e-cigarettes for 24 HOURS PRIOR TO SURGERY.                      DO NOT Use any chewable tobacco products for at least 6 hours prior to surgery.  __x___ 5. If you start any new medication after this appointment and prior to surgery, please                    Bring it with you on the day of surgery.  ___x__ 6. Notify your doctor if there is any change in  your medical condition, such as fever, infection, vomitting,                   Diarrhea or any open sores.  __x___ 7.  USE the CHG WIPES as instructed, the night before surgery and the day of surgery.                   Once you have washed with this soap, do NOT use any of the following: Powders, perfumes                    or lotions. Please do not wear make up, hairpins, clips or nail polish. You MAY NOT wear deodorant.                   Men may shave their face and neck.  Women need to shave 48 hours prior to surgery.                   DO NOT wear ANY jewelry on the day of surgery. If there are rings that are too tight to                    remove easily, please address this prior to the surgery day. Piercings need to be removed.                                                                     NO METAL ON YOUR BODY.                    Do NOT bring any valuables.  If you came to Pre-Admit testing then you will not need license,                     insurance card or credit card.  If you will be staying overnight, please either leave your things in                     the car or have your family be responsible for these items.                     [facility] IS NOT RESPONSIBLE FOR BELONGINGS OR VALUABLES.  ___X__ 8. DO NOT wear contact lenses on surgery day.  You may not have dentures,                     Hearing aides, contacts or glasses in the operating room. These items can be                    Placed in the Recovery Room to receive immediately after surgery.  __x___ 9. IF YOU ARE SCHEDULED TO GO HOME ON THE SAME DAY, YOU MUST                   Have someone to drive you home and to stay with you  for the first 24 hours.                    Have an arrangement prior to arriving on surgery day.  ___x__ 10. Take the following medications  on the morning of surgery with a sip of  water:                              1.  AMLODIPINE                     2.  LABETOLOL                     3.  ZOLOFT                     4.                     5.                     6.  _____ 11.  Follow any instructions provided to you by your surgeon.                        Such as enema, clear liquid bowel prep  __X__  12. DO NOT STOP ASPIRIN                            DO NOT TAKE IT ON THE MORNING OF SURGERY   __x___ 13. STOP Anti-inflammatories as of: TODAY                      This includes IBUPROFEN / MOTRIN / ADVIL / ALEVE/ NAPROXYN                    YOU MAY TAKE TYLENOL ANY TIME PRIOR TO SURGERY.   _N/A__  14.  Stop Metformin 2 full days prior to surgery.  Stop on:                     TAKE 1/2 OF USUAL INSULIN DOSE ON THE EVENING PRIOR TO SURGERY.                     Do NOT take any diabetes medications on surgery day.  __X____17.  Continue to take the following medications but do not take on the morning of surgery:                             LOSARTAN //  PHOSLO //  ASPIRIN  ______18. If staying overnight, please have appropriate shoes to wear to be able to walk around the unit.                   Wear clean and comfortable clothing to the hospital.  WEAR A LOOSE FITTING SHIRT THAT IS EASY TO GET YOUR ARM INTO AFTER SURGERY.  FILL IN POWER OF ATTORNEY PAPERS AND HAVE NOTARIZED.  IF YOU COMPLETE THIS PRIOR    TO SURGERY, PLEASE BRING IT WITH YOU SO WE CAN MAKE A COPY FOR YOUR CHART.

## 2019-06-27 ENCOUNTER — Ambulatory Visit: Payer: Medicare Other | Admitting: Anesthesiology

## 2019-06-27 ENCOUNTER — Ambulatory Visit: Payer: Medicare Other

## 2019-06-27 ENCOUNTER — Ambulatory Visit
Admission: RE | Admit: 2019-06-27 | Discharge: 2019-06-27 | Disposition: A | Discharge status: Home / Self Care | Payer: Medicare Other | Attending: Vascular Surgery | Admitting: Vascular Surgery

## 2019-06-27 ENCOUNTER — Encounter: Payer: Self-pay | Admitting: *Deleted

## 2019-06-27 ENCOUNTER — Other Ambulatory Visit: Payer: Self-pay

## 2019-06-27 ENCOUNTER — Encounter
Admission: RE | Disposition: A | Discharge status: Home / Self Care | Payer: Self-pay | Source: Home / Self Care | Attending: Vascular Surgery

## 2019-06-27 DIAGNOSIS — I120 Hypertensive chronic kidney disease with stage 5 chronic kidney disease or end stage renal disease: Secondary | ICD-10-CM | POA: Diagnosis present

## 2019-06-27 DIAGNOSIS — Z6833 Body mass index (BMI) 33.0-33.9, adult: Secondary | ICD-10-CM | POA: Insufficient documentation

## 2019-06-27 DIAGNOSIS — N186 End stage renal disease: Secondary | ICD-10-CM | POA: Insufficient documentation

## 2019-06-27 DIAGNOSIS — E1165 Type 2 diabetes mellitus with hyperglycemia: Secondary | ICD-10-CM | POA: Insufficient documentation

## 2019-06-27 DIAGNOSIS — E1122 Type 2 diabetes mellitus with diabetic chronic kidney disease: Secondary | ICD-10-CM | POA: Insufficient documentation

## 2019-06-27 DIAGNOSIS — E782 Mixed hyperlipidemia: Secondary | ICD-10-CM | POA: Diagnosis not present

## 2019-06-27 DIAGNOSIS — Z79899 Other long term (current) drug therapy: Secondary | ICD-10-CM | POA: Insufficient documentation

## 2019-06-27 DIAGNOSIS — N185 Chronic kidney disease, stage 5: Secondary | ICD-10-CM | POA: Diagnosis not present

## 2019-06-27 HISTORY — PX: AV FISTULA PLACEMENT: SHX1204

## 2019-06-27 LAB — POCT I-STAT, CHEM 8
BUN: 32 mg/dL — ABNORMAL HIGH (ref 6–20)
Calcium, Ion: 1.21 mmol/L (ref 1.15–1.40)
Chloride: 101 mmol/L (ref 98–111)
Creatinine, Ser: 4.9 mg/dL — ABNORMAL HIGH (ref 0.61–1.24)
Glucose, Bld: 107 mg/dL — ABNORMAL HIGH (ref 70–99)
HCT: 39 % (ref 39.0–52.0)
Hemoglobin: 13.3 g/dL (ref 13.0–17.0)
Potassium: 3.6 mmol/L (ref 3.5–5.1)
Sodium: 137 mmol/L (ref 135–145)
TCO2: 23 mmol/L (ref 22–32)

## 2019-06-27 LAB — TYPE AND SCREEN
ABO/RH(D): AB POS
Antibody Screen: NEGATIVE

## 2019-06-27 LAB — GLUCOSE, CAPILLARY: Glucose-Capillary: 97 mg/dL (ref 70–99)

## 2019-06-27 SURGERY — ARTERIOVENOUS (AV) FISTULA CREATION
Anesthesia: General | Laterality: Left

## 2019-06-27 MED ORDER — FENTANYL CITRATE (PF) 100 MCG/2ML IJ SOLN
INTRAMUSCULAR | Status: AC
  Filled 2019-06-27: qty 2

## 2019-06-27 MED ORDER — BUPIVACAINE LIPOSOME 1.3 % IJ SUSP
INTRAMUSCULAR | Status: AC
  Filled 2019-06-27: qty 20

## 2019-06-27 MED ORDER — HYDROCODONE-ACETAMINOPHEN 5-325 MG PO TABS: 1.0000 | ORAL_TABLET | Freq: Four times a day (QID) | ORAL | 0 refills | Status: DC | PRN

## 2019-06-27 MED ORDER — CEFAZOLIN SODIUM-DEXTROSE 1-4 GM/50ML-% IV SOLN
INTRAVENOUS | Status: AC
  Filled 2019-06-27: qty 50

## 2019-06-27 MED ORDER — LIDOCAINE HCL (PF) 1 % IJ SOLN
INTRAMUSCULAR | Status: AC
  Filled 2019-06-27: qty 5

## 2019-06-27 MED ORDER — MIDAZOLAM HCL 2 MG/2ML IJ SOLN
INTRAMUSCULAR | Status: AC
  Filled 2019-06-27: qty 2

## 2019-06-27 MED ORDER — ONDANSETRON HCL 4 MG/2ML IJ SOLN: 4.0000 mg | Freq: Once | INTRAMUSCULAR | Status: DC | PRN

## 2019-06-27 MED ORDER — CEFAZOLIN SODIUM-DEXTROSE 2-4 GM/100ML-% IV SOLN
INTRAVENOUS | Status: AC
  Filled 2019-06-27: qty 100

## 2019-06-27 MED ORDER — ROPIVACAINE HCL 5 MG/ML IJ SOLN
INTRAMUSCULAR | Status: DC | PRN
  Administered 2019-06-27: 10 mL
  Administered 2019-06-27: 30 mL via PERINEURAL

## 2019-06-27 MED ORDER — MIDAZOLAM HCL 2 MG/2ML IJ SOLN
1.0000 mg | Freq: Once | INTRAMUSCULAR | Status: AC
  Administered 2019-06-27: 08:00:00 1 mg via INTRAVENOUS

## 2019-06-27 MED ORDER — PROPOFOL 500 MG/50ML IV EMUL
INTRAVENOUS | Status: AC
  Filled 2019-06-27: qty 50

## 2019-06-27 MED ORDER — FENTANYL CITRATE (PF) 100 MCG/2ML IJ SOLN
INTRAMUSCULAR | Status: AC
  Administered 2019-06-27: 50 ug via INTRAVENOUS
  Filled 2019-06-27: qty 2

## 2019-06-27 MED ORDER — SODIUM CHLORIDE 0.9 % IV SOLN
INTRAVENOUS | Status: DC | PRN
  Administered 2019-06-27: 50 mL via INTRAMUSCULAR

## 2019-06-27 MED ORDER — CEFAZOLIN SODIUM-DEXTROSE 1-4 GM/50ML-% IV SOLN
1.0000 g | INTRAVENOUS | Status: AC
  Administered 2019-06-27: 1 g via INTRAVENOUS

## 2019-06-27 MED ORDER — MIDAZOLAM HCL 2 MG/2ML IJ SOLN
INTRAMUSCULAR | Status: AC
  Administered 2019-06-27: 1 mg via INTRAVENOUS
  Filled 2019-06-27: qty 2

## 2019-06-27 MED ORDER — FAMOTIDINE 20 MG PO TABS
ORAL_TABLET | ORAL | Status: AC
  Administered 2019-06-27: 07:00:00 20 mg via ORAL
  Filled 2019-06-27: qty 1

## 2019-06-27 MED ORDER — CHLORHEXIDINE GLUCONATE CLOTH 2 % EX PADS: 6.0000 | MEDICATED_PAD | Freq: Once | CUTANEOUS | Status: DC

## 2019-06-27 MED ORDER — HYDROCODONE-ACETAMINOPHEN 5-325 MG PO TABS: 1.0000 | ORAL_TABLET | Freq: Four times a day (QID) | ORAL | Status: DC | PRN

## 2019-06-27 MED ORDER — FAMOTIDINE 20 MG PO TABS
20.0000 mg | ORAL_TABLET | Freq: Once | ORAL | Status: AC
  Administered 2019-06-27: 07:00:00 20 mg via ORAL

## 2019-06-27 MED ORDER — ROPIVACAINE HCL 5 MG/ML IJ SOLN
INTRAMUSCULAR | Status: AC
  Filled 2019-06-27: qty 60

## 2019-06-27 MED ORDER — SEVOFLURANE IN SOLN
RESPIRATORY_TRACT | Status: AC
  Filled 2019-06-27: qty 250

## 2019-06-27 MED ORDER — ONDANSETRON HCL 4 MG/2ML IJ SOLN
4.0000 mg | Freq: Four times a day (QID) | INTRAMUSCULAR | Status: DC | PRN
  Administered 2019-06-27: 4 mg via INTRAVENOUS

## 2019-06-27 MED ORDER — MIDAZOLAM HCL 2 MG/2ML IJ SOLN
INTRAMUSCULAR | Status: DC | PRN
  Administered 2019-06-27 (×2): 1 mg via INTRAVENOUS

## 2019-06-27 MED ORDER — HEPARIN SODIUM (PORCINE) 5000 UNIT/ML IJ SOLN
INTRAMUSCULAR | Status: AC
  Filled 2019-06-27: qty 1

## 2019-06-27 MED ORDER — FENTANYL CITRATE (PF) 100 MCG/2ML IJ SOLN: 25.0000 ug | INTRAMUSCULAR | Status: DC | PRN

## 2019-06-27 MED ORDER — PROPOFOL 500 MG/50ML IV EMUL
INTRAVENOUS | Status: DC | PRN
  Administered 2019-06-27: 15 ug/kg/min via INTRAVENOUS

## 2019-06-27 MED ORDER — FENTANYL CITRATE (PF) 100 MCG/2ML IJ SOLN
INTRAMUSCULAR | Status: DC | PRN
  Administered 2019-06-27 (×2): 50 ug via INTRAVENOUS

## 2019-06-27 MED ORDER — HYDROCODONE-ACETAMINOPHEN 5-325 MG PO TABS
ORAL_TABLET | ORAL | Status: AC
  Filled 2019-06-27: qty 1

## 2019-06-27 MED ORDER — HYDROMORPHONE HCL 1 MG/ML IJ SOLN: 1.0000 mg | Freq: Once | INTRAMUSCULAR | Status: DC | PRN

## 2019-06-27 MED ORDER — BUPIVACAINE HCL (PF) 0.5 % IJ SOLN
INTRAMUSCULAR | Status: AC
  Filled 2019-06-27: qty 30

## 2019-06-27 MED ORDER — LIDOCAINE HCL (PF) 1 % IJ SOLN
INTRAMUSCULAR | Status: DC | PRN
  Administered 2019-06-27: 5 mL via SUBCUTANEOUS

## 2019-06-27 MED ORDER — SODIUM CHLORIDE 0.9 % IV SOLN
INTRAVENOUS | Status: DC
  Administered 2019-06-27: 08:00:00 via INTRAVENOUS

## 2019-06-27 MED ORDER — FENTANYL CITRATE (PF) 100 MCG/2ML IJ SOLN
50.0000 ug | Freq: Once | INTRAMUSCULAR | Status: AC
  Administered 2019-06-27: 08:00:00 50 ug via INTRAVENOUS

## 2019-06-27 SURGICAL SUPPLY — 59 items
APPLIER CLIP 11 MED OPEN (CLIP)
APPLIER CLIP 9.375 SM OPEN (CLIP)
BAG DECANTER FOR FLEXI CONT (MISCELLANEOUS) ×3 IMPLANT
BLADE SURG SZ11 CARB STEEL (BLADE) ×3 IMPLANT
BOOT SUTURE AID YELLOW STND (SUTURE) ×3 IMPLANT
BRUSH SCRUB EZ  4% CHG (MISCELLANEOUS) ×2
BRUSH SCRUB EZ 4% CHG (MISCELLANEOUS) ×1 IMPLANT
CANISTER SUCT 1200ML W/VALVE (MISCELLANEOUS) ×3 IMPLANT
CHLORAPREP W/TINT 26 (MISCELLANEOUS) ×3 IMPLANT
CLIP APPLIE 11 MED OPEN (CLIP) IMPLANT
CLIP APPLIE 9.375 SM OPEN (CLIP) IMPLANT
COVER WAND RF STERILE (DRAPES) ×3 IMPLANT
DERMABOND ADVANCED (GAUZE/BANDAGES/DRESSINGS) ×2
DERMABOND ADVANCED .7 DNX12 (GAUZE/BANDAGES/DRESSINGS) ×1 IMPLANT
DRESSING SURGICEL FIBRLLR 1X2 (HEMOSTASIS) ×1 IMPLANT
DRSG SURGICEL FIBRILLAR 1X2 (HEMOSTASIS) ×3
ELECT CAUTERY BLADE 6.4 (BLADE) ×3 IMPLANT
ELECT REM PT RETURN 9FT ADLT (ELECTROSURGICAL) ×3
ELECTRODE REM PT RTRN 9FT ADLT (ELECTROSURGICAL) ×1 IMPLANT
GEL ULTRASOUND 20GR AQUASONIC (MISCELLANEOUS) IMPLANT
GLOVE BIO SURGEON STRL SZ7 (GLOVE) ×9 IMPLANT
GLOVE INDICATOR 7.5 STRL GRN (GLOVE) ×5 IMPLANT
GLOVE SURG SYN 8.0 (GLOVE) ×3 IMPLANT
GLOVE SURG SYN 8.0 PF PI (GLOVE) ×1 IMPLANT
GOWN STRL REUS W/ TWL LRG LVL3 (GOWN DISPOSABLE) ×2 IMPLANT
GOWN STRL REUS W/ TWL XL LVL3 (GOWN DISPOSABLE) ×1 IMPLANT
GOWN STRL REUS W/TWL LRG LVL3 (GOWN DISPOSABLE) ×4
GOWN STRL REUS W/TWL XL LVL3 (GOWN DISPOSABLE) ×2
IV NS 500ML (IV SOLUTION) ×2
IV NS 500ML BAXH (IV SOLUTION) ×1 IMPLANT
KIT TURNOVER KIT A (KITS) ×3 IMPLANT
LABEL OR SOLS (LABEL) ×3 IMPLANT
LOOP RED MAXI  1X406MM (MISCELLANEOUS) ×2
LOOP VESSEL MAXI 1X406 RED (MISCELLANEOUS) ×1 IMPLANT
LOOP VESSEL MINI 0.8X406 BLUE (MISCELLANEOUS) ×2 IMPLANT
LOOPS BLUE MINI 0.8X406MM (MISCELLANEOUS) ×4
NDL FILTER BLUNT 18X1 1/2 (NEEDLE) ×1 IMPLANT
NDL HYPO 30X.5 LL (NEEDLE) IMPLANT
NEEDLE FILTER BLUNT 18X 1/2SAF (NEEDLE) ×2
NEEDLE FILTER BLUNT 18X1 1/2 (NEEDLE) ×1 IMPLANT
NEEDLE HYPO 30X.5 LL (NEEDLE) IMPLANT
PACK EXTREMITY ARMC (MISCELLANEOUS) ×3 IMPLANT
PAD PREP 24X41 OB/GYN DISP (PERSONAL CARE ITEMS) ×3 IMPLANT
STOCKINETTE 48X4 2 PLY STRL (GAUZE/BANDAGES/DRESSINGS) ×1 IMPLANT
STOCKINETTE STRL 4IN 9604848 (GAUZE/BANDAGES/DRESSINGS) ×3 IMPLANT
SUT MNCRL+ 5-0 UNDYED PC-3 (SUTURE) ×1 IMPLANT
SUT MONOCRYL 5-0 (SUTURE) ×2
SUT PROLENE 6 0 BV (SUTURE) ×10 IMPLANT
SUT PROLENE 7 0 BV 1 (SUTURE) ×2 IMPLANT
SUT SILK 2 0 (SUTURE) ×2
SUT SILK 2-0 18XBRD TIE 12 (SUTURE) ×1 IMPLANT
SUT SILK 3 0 (SUTURE) ×2
SUT SILK 3-0 18XBRD TIE 12 (SUTURE) ×1 IMPLANT
SUT SILK 4 0 (SUTURE) ×2
SUT SILK 4-0 18XBRD TIE 12 (SUTURE) ×1 IMPLANT
SUT VIC AB 3-0 SH 27 (SUTURE) ×2
SUT VIC AB 3-0 SH 27X BRD (SUTURE) ×1 IMPLANT
SYR 20ML LL LF (SYRINGE) ×3 IMPLANT
SYR 3ML LL SCALE MARK (SYRINGE) ×3 IMPLANT

## 2019-06-27 NOTE — Plan of Care (Signed)
Spoke with the patient by phone.  The patient states he is doing well he denies any postoperative nausea.  He is comfortable.  He notes his block is still pretty much working.  He did pick up the Norco prescription at the [facility] so he does have some medication for when his block wears off.  He had no further questions and we will see him in 2 weeks with an HDA and follow-up.

## 2019-06-27 NOTE — Anesthesia Postprocedure Evaluation (Signed)
Anesthesia Post Note  Patient: Steve Vance  Procedure(s) Performed: ARTERIOVENOUS (AV) FISTULA CREATION ( BRACHIAL CEPHALIC ) (Left )  Patient location during evaluation: PACU Anesthesia Type: General Level of consciousness: awake and alert Pain management: pain level controlled Vital Signs Assessment: post-procedure vital signs reviewed and stable Respiratory status: spontaneous breathing, nonlabored ventilation, respiratory function stable and patient connected to nasal cannula oxygen Cardiovascular status: blood pressure returned to baseline and stable Postop Assessment: no apparent nausea or vomiting Anesthetic complications: no     Last Vitals:  Vitals:   06/27/19 1155 06/27/19 1301  BP: (!) 159/75   Pulse: 65 64  Resp: 16 18  Temp: (!) 36.1 C   SpO2: 100% 100%    Last Pain:  Vitals:   06/27/19 1301  TempSrc:   PainSc: 5                  Martha Clan

## 2019-06-27 NOTE — Transfer of Care (Signed)
Immediate Anesthesia Transfer of Care Note  Patient: Steve Vance  Procedure(s) Performed: ARTERIOVENOUS (AV) FISTULA CREATION ( BRACHIAL CEPHALIC ) (Left )  Patient Location: PACU  Anesthesia Type:MAC and Regional  Level of Consciousness: awake, alert  and oriented  Airway & Oxygen Therapy: Patient Spontanous Breathing and Patient connected to nasal cannula oxygen  Post-op Assessment: Report given to RN, Post -op Vital signs reviewed and stable and Patient moving all extremities  Post vital signs: Reviewed and stable  Last Vitals:  Vitals Value Taken Time  BP    Temp    Pulse 80 06/27/19 1040  Resp 14 06/27/19 1040  SpO2 100 % 06/27/19 1040  Vitals shown include unvalidated device data.  Last Pain:  Vitals:   06/27/19 0707  TempSrc: Tympanic  PainSc: 6       Patients Stated Pain Goal: 0 (01/77/93 9030)  Complications: No apparent anesthesia complications

## 2019-06-27 NOTE — H&P (Signed)
[facility] History & Physical Update  The patient was interviewed and re-examined.  The patient's previous History and Physical has been reviewed and is unchanged.  There is no change in the plan of care. We plan to proceed with the scheduled procedure.  [name], MD  06/27/2019, 8:32 AM
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
## 2019-06-27 NOTE — Discharge Instructions (Signed)

## 2019-06-27 NOTE — Anesthesia Procedure Notes (Signed)
Anesthesia Regional Block: Supraclavicular block   Pre-Anesthetic Checklist: ,, timeout performed, Correct Patient, Correct Site, Correct Laterality, Correct Procedure, Correct Position, site marked, Risks and benefits discussed,  Surgical consent,  Pre-op evaluation,  At surgeon's request and post-op pain management  Laterality: Left  Prep: chloraprep       Needles:  Injection technique: Single-shot  Needle Type: Stimiplex     Needle Length: 10cm  Needle Gauge: 22     Additional Needles:   Procedures:,,,, ultrasound used (permanent image in chart),,,,  Narrative:  Start time: 06/27/2019 8:04 AM End time: 06/27/2019 8:22 AM Injection made incrementally with aspirations every 5 mL.  Performed by: Personally  Anesthesiologist: Martha Clan, MD  Additional Notes: Functioning IV was confirmed and monitors were applied.  A 23mm 22ga Stimuplex needle was used. Sterile prep and drape,hand hygiene and sterile gloves were used.  Negative aspiration and negative test dose prior to incremental administration of local anesthetic. The patient tolerated the procedure well.  Additionally, a superficial ring block was performed without complications.

## 2019-06-27 NOTE — Anesthesia Post-op Follow-up Note (Signed)
Anesthesia QCDR form completed.        

## 2019-06-27 NOTE — Op Note (Signed)
     OPERATIVE NOTE   PROCEDURE: left brachial cephalic arteriovenous fistula placement  PRE-OPERATIVE DIAGNOSIS: End Stage Renal Disease  POST-OPERATIVE DIAGNOSIS: End Stage Renal Disease  SURGEON: [name]  ASSISTANT(S): none  ANESTHESIA: general  ESTIMATED BLOOD LOSS: <50 cc  FINDING(S): 4 mm cephalic vein  SPECIMEN(S):  none  INDICATIONS:   Steve Vance is a 58 y.o. male who presents with end stage renal disease.  The patient is scheduled for left brachiocephalic arteriovenous fistula placement.  The patient is aware the risks include but are not limited to: bleeding, infection, steal syndrome, nerve damage, ischemic monomelic neuropathy, failure to mature, and need for additional procedures.  The patient is aware of the risks of the procedure and elects to proceed forward.  DESCRIPTION: After full informed written consent was obtained from the patient, the patient was brought back to the operating room and placed supine upon the operating table.  Prior to induction, the patient received IV antibiotics.   After obtaining adequate anesthesia, the patient was then prepped and draped in the standard fashion for a left arm access procedure.   A first assistant was required to provide a safe and appropriate environment for executing the surgery.  The assistant was integral in providing retraction, exposure, running suture providing suction and in the closing process.   A curvilinear incision was then created midway between the radial impulse and the cephalic vein. The cephalic vein was then identified and dissected circumferentially. It was marked with a surgical marker.    Attention was then turned to the brachial artery which was exposed through the same incision and looped proximally and distally. Side branches were controlled with 4-0 silk ties.  The distal segment of the vein was ligated with a  2-0 silk, and the vein was transected.  The proximal segment was  interrogated with serial dilators.  The vein accepted up to a 4 mm dilator without any difficulty. Heparinized saline was infused into the vein and clamped it with a small bulldog.  At this point, I reset my exposure of the brachial artery and controlled the artery with vessel loops proximally and distally.  An arteriotomy was then made with a #11 blade, and extended with a Potts scissor.  Heparinized saline was injected proximal and distal into the radial artery.  The vein was then approximated to the artery while the artery was in its native bed and subsequently the vein was beveled using Potts scissors. The vein was then sewn to the artery in an end-to-side configuration with a running stitch of 6-0 Prolene.  Prior to completing this anastomosis Flushing maneuvers were performed and the artery was allowed to forward and back bleed.  There was no evidence of clot from any vessels.  I completed the anastomosis in the usual fashion and then released all vessel loops and clamps.    There was good  thrill in the venous outflow, and there was 1+ palpable radial pulse.  At this point, I irrigated out the surgical wound.  There was no further active bleeding.  The subcutaneous tissue was reapproximated with a running stitch of 3-0 Vicryl.  The skin was then reapproximated with a running subcuticular stitch of 4-0 Vicryl.  The skin was then cleaned, dried, and reinforced with Dermabond.    The patient tolerated this procedure well.   COMPLICATIONS: None  CONDITION: [facility]  Office: [phone]   06/27/2019, 10:41 AM

## 2019-06-28 ENCOUNTER — Encounter: Payer: Self-pay | Admitting: Vascular Surgery

## 2019-07-02 ENCOUNTER — Telehealth (INDEPENDENT_AMBULATORY_CARE_PROVIDER_SITE_OTHER): Payer: Self-pay | Admitting: Vascular Surgery

## 2019-07-02 ENCOUNTER — Other Ambulatory Visit (INDEPENDENT_AMBULATORY_CARE_PROVIDER_SITE_OTHER): Payer: Self-pay | Admitting: Nurse Practitioner

## 2019-07-02 NOTE — Telephone Encounter (Signed)
Please advise 

## 2019-07-02 NOTE — Telephone Encounter (Signed)
That's fine, he can see me or Schnier with an HDA

## 2019-07-03 ENCOUNTER — Other Ambulatory Visit (INDEPENDENT_AMBULATORY_CARE_PROVIDER_SITE_OTHER): Payer: Self-pay | Admitting: Vascular Surgery

## 2019-07-03 ENCOUNTER — Other Ambulatory Visit (INDEPENDENT_AMBULATORY_CARE_PROVIDER_SITE_OTHER): Payer: Self-pay | Admitting: Nurse Practitioner

## 2019-07-03 DIAGNOSIS — M7989 Other specified soft tissue disorders: Secondary | ICD-10-CM

## 2019-07-03 DIAGNOSIS — Z9889 Other specified postprocedural states: Secondary | ICD-10-CM

## 2019-07-03 DIAGNOSIS — N186 End stage renal disease: Secondary | ICD-10-CM

## 2019-07-03 DIAGNOSIS — M79603 Pain in arm, unspecified: Secondary | ICD-10-CM

## 2019-07-04 ENCOUNTER — Ambulatory Visit (INDEPENDENT_AMBULATORY_CARE_PROVIDER_SITE_OTHER): Payer: Medicare Other | Admitting: Nurse Practitioner

## 2019-07-04 ENCOUNTER — Encounter (INDEPENDENT_AMBULATORY_CARE_PROVIDER_SITE_OTHER): Payer: Self-pay | Admitting: Nurse Practitioner

## 2019-07-04 ENCOUNTER — Ambulatory Visit (INDEPENDENT_AMBULATORY_CARE_PROVIDER_SITE_OTHER): Payer: Medicare Other

## 2019-07-04 ENCOUNTER — Other Ambulatory Visit: Payer: Self-pay

## 2019-07-04 VITALS — BP 157/76 | HR 71 | Resp 16 | Wt 245.4 lb

## 2019-07-04 DIAGNOSIS — M79603 Pain in arm, unspecified: Secondary | ICD-10-CM | POA: Diagnosis not present

## 2019-07-04 DIAGNOSIS — N186 End stage renal disease: Secondary | ICD-10-CM

## 2019-07-04 DIAGNOSIS — Z9889 Other specified postprocedural states: Secondary | ICD-10-CM | POA: Diagnosis not present

## 2019-07-04 DIAGNOSIS — I10 Essential (primary) hypertension: Secondary | ICD-10-CM

## 2019-07-04 DIAGNOSIS — M7989 Other specified soft tissue disorders: Secondary | ICD-10-CM

## 2019-07-04 DIAGNOSIS — E782 Mixed hyperlipidemia: Secondary | ICD-10-CM

## 2019-07-09 ENCOUNTER — Encounter (INDEPENDENT_AMBULATORY_CARE_PROVIDER_SITE_OTHER): Payer: Self-pay | Admitting: Nurse Practitioner
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
## 2019-07-13 ENCOUNTER — Telehealth (INDEPENDENT_AMBULATORY_CARE_PROVIDER_SITE_OTHER): Payer: Self-pay

## 2019-07-13 NOTE — Telephone Encounter (Signed)
Home health called to inform patient has decline the last two weeks but she was able to do start of care admission. Patient declined 2nd week and this week the patient decline due to death in the family.

## 2019-07-16 ENCOUNTER — Ambulatory Visit (INDEPENDENT_AMBULATORY_CARE_PROVIDER_SITE_OTHER): Payer: Medicare Other | Admitting: Vascular Surgery

## 2019-07-16 ENCOUNTER — Encounter (INDEPENDENT_AMBULATORY_CARE_PROVIDER_SITE_OTHER): Payer: Medicare Other

## 2019-07-19 ENCOUNTER — Ambulatory Visit: Payer: Self-pay | Admitting: Urology

## 2019-07-30 ENCOUNTER — Other Ambulatory Visit: Payer: Self-pay

## 2019-07-30 ENCOUNTER — Ambulatory Visit (INDEPENDENT_AMBULATORY_CARE_PROVIDER_SITE_OTHER): Payer: Medicare Other | Admitting: Urology

## 2019-07-30 ENCOUNTER — Encounter: Payer: Self-pay | Admitting: Urology

## 2019-07-30 VITALS — BP 167/80 | HR 76 | Ht 71.0 in | Wt 240.0 lb

## 2019-07-30 DIAGNOSIS — R3121 Asymptomatic microscopic hematuria: Secondary | ICD-10-CM | POA: Diagnosis not present

## 2019-07-30 DIAGNOSIS — N281 Cyst of kidney, acquired: Secondary | ICD-10-CM | POA: Diagnosis not present

## 2019-07-30 LAB — URINALYSIS, COMPLETE
Bilirubin, UA: NEGATIVE
Glucose, UA: NEGATIVE
Ketones, UA: NEGATIVE
Leukocytes,UA: NEGATIVE
Nitrite, UA: NEGATIVE
Specific Gravity, UA: 1.025 (ref 1.005–1.030)
Urobilinogen, Ur: 0.2 mg/dL (ref 0.2–1.0)
pH, UA: 5.5 (ref 5.0–7.5)

## 2019-07-30 LAB — MICROSCOPIC EXAMINATION: RBC, Urine: >30 /hpf — AB (ref 0–2)
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
## 2019-07-30 NOTE — Patient Instructions (Signed)
Cystoscopy Cystoscopy is a procedure that is used to help diagnose and sometimes treat conditions that affect the lower urinary tract. The lower urinary tract includes the bladder and the urethra. The urethra is the tube that drains urine from the bladder. Cystoscopy is done using a thin, tube-shaped instrument with a light and camera at the end (cystoscope). The cystoscope may be hard or flexible, depending on the goal of the procedure. The cystoscope is inserted through the urethra, into the bladder. Cystoscopy may be recommended if you have:  Urinary tract infections that keep coming back.  Blood in the urine (hematuria).  An inability to control when you urinate (urinary incontinence) or an overactive bladder.  Unusual cells found in a urine sample.  A blockage in the urethra, such as a urinary stone.  Painful urination.  An abnormality in the bladder found during an intravenous pyelogram (IVP) or CT scan. Cystoscopy may also be done to remove a sample of tissue to be examined under a microscope (biopsy). Tell a health care provider about:  Any allergies you have.  All medicines you are taking, including vitamins, herbs, eye drops, creams, and over-the-counter medicines.  Any problems you or family members have had with anesthetic medicines.  Any blood disorders you have.  Any surgeries you have had.  Any medical conditions you have.  Whether you are pregnant or may be pregnant. What are the risks? Generally, this is a safe procedure. However, problems may occur, including:  Infection.  Bleeding.  Allergic reactions to medicines.  Damage to other structures or organs. What happens before the procedure?  Ask your health care provider about: ? Changing or stopping your regular medicines. This is especially important if you are taking diabetes medicines or blood thinners. ? Taking medicines such as aspirin and ibuprofen. These medicines can thin your blood. Do not take  these medicines unless your health care provider tells you to take them. ? Taking over-the-counter medicines, vitamins, herbs, and supplements.  Follow instructions from your health care provider about eating or drinking restrictions.  Ask your health care provider what steps will be taken to help prevent infection. These may include: ? Washing skin with a germ-killing soap. ? Taking antibiotic medicine.  You may have an exam or testing, such as: ? X-rays of the bladder, urethra, or kidneys. ? Urine tests to check for signs of infection.  Plan to have someone take you home from the hospital or clinic. What happens during the procedure?   You will be given one or more of the following: ? A medicine to help you relax (sedative). ? A medicine to numb the area (local anesthetic).  The area around the opening of your urethra will be cleaned.  The cystoscope will be passed through your urethra into your bladder.  Germ-free (sterile) fluid will flow through the cystoscope to fill your bladder. The fluid will stretch your bladder so that your health care provider can clearly examine your bladder walls.  Your doctor will look at the urethra and bladder. Your doctor may take a biopsy or remove stones.  The cystoscope will be removed, and your bladder will be emptied. The procedure may vary among health care providers and hospitals. What can I expect after the procedure? After the procedure, it is common to have:  Some soreness or pain in your abdomen and urethra.  Urinary symptoms. These include: ? Mild pain or burning when you urinate. Pain should stop within a few minutes after you urinate. This   may last for up to 1 week. ? A small amount of blood in your urine for several days. ? Feeling like you need to urinate but producing only a small amount of urine. Follow these instructions at home: Medicines  Take over-the-counter and prescription medicines only as told by your health care  provider.  If you were prescribed an antibiotic medicine, take it as told by your health care provider. Do not stop taking the antibiotic even if you start to feel better. General instructions  Return to your normal activities as told by your health care provider. Ask your health care provider what activities are safe for you.  Do not drive for 24 hours if you were given a sedative during your procedure.  Watch for any blood in your urine. If the amount of blood in your urine increases, call your health care provider.  Follow instructions from your health care provider about eating or drinking restrictions.  If a tissue sample was removed for testing (biopsy) during your procedure, it is up to you to get your test results. Ask your health care provider, or the department that is doing the test, when your results will be ready.  Drink enough fluid to keep your urine pale yellow.  Keep all follow-up visits as told by your health care provider. This is important. Contact a health care provider if you:  Have pain that gets worse or does not get better with medicine, especially pain when you urinate.  Have trouble urinating.  Have more blood in your urine. Get help right away if you:  Have blood clots in your urine.  Have abdominal pain.  Have a fever or chills.  Are unable to urinate. Summary  Cystoscopy is a procedure that is used to help diagnose and sometimes treat conditions that affect the lower urinary tract.  Cystoscopy is done using a thin, tube-shaped instrument with a light and camera at the end.  After the procedure, it is common to have some soreness or pain in your abdomen and urethra.  Watch for any blood in your urine. If the amount of blood in your urine increases, call your health care provider.  If you were prescribed an antibiotic medicine, take it as told by your health care provider. Do not stop taking the antibiotic even if you start to feel better. This  information is not intended to replace advice given to you by your health care provider. Make sure you discuss any questions you have with your health care provider. Document Released: 08/20/2000 Document Revised: 08/15/2018 Document Reviewed: 08/15/2018 Elsevier Patient Education  2020 [employer]  
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
## 2019-08-15 ENCOUNTER — Other Ambulatory Visit: Payer: Self-pay | Admitting: Family Medicine

## 2019-08-15 DIAGNOSIS — M5412 Radiculopathy, cervical region: Secondary | ICD-10-CM

## 2019-08-16 ENCOUNTER — Ambulatory Visit (INDEPENDENT_AMBULATORY_CARE_PROVIDER_SITE_OTHER): Payer: Medicare Other | Admitting: Vascular Surgery

## 2019-08-16 ENCOUNTER — Other Ambulatory Visit: Payer: Self-pay

## 2019-08-16 ENCOUNTER — Encounter (INDEPENDENT_AMBULATORY_CARE_PROVIDER_SITE_OTHER): Payer: Self-pay | Admitting: Vascular Surgery

## 2019-08-16 VITALS — BP 164/80 | HR 77 | Resp 16 | Ht 71.0 in | Wt 244.0 lb

## 2019-08-16 DIAGNOSIS — N186 End stage renal disease: Secondary | ICD-10-CM

## 2019-08-23 ENCOUNTER — Other Ambulatory Visit: Payer: Self-pay

## 2019-08-23 ENCOUNTER — Emergency Department: Payer: Medicare Other

## 2019-08-23 ENCOUNTER — Emergency Department
Admission: EM | Admit: 2019-08-23 | Discharge: 2019-08-23 | Disposition: A | Discharge status: Home / Self Care | Payer: Medicare Other | Attending: Student | Admitting: Student

## 2019-08-23 ENCOUNTER — Encounter: Payer: Self-pay | Admitting: Emergency Medicine

## 2019-08-23 DIAGNOSIS — E119 Type 2 diabetes mellitus without complications: Secondary | ICD-10-CM | POA: Diagnosis not present

## 2019-08-23 DIAGNOSIS — N186 End stage renal disease: Secondary | ICD-10-CM | POA: Diagnosis not present

## 2019-08-23 DIAGNOSIS — T148XXA Other injury of unspecified body region, initial encounter: Secondary | ICD-10-CM

## 2019-08-23 DIAGNOSIS — Z7982 Long term (current) use of aspirin: Secondary | ICD-10-CM | POA: Insufficient documentation

## 2019-08-23 DIAGNOSIS — M79602 Pain in left arm: Secondary | ICD-10-CM | POA: Insufficient documentation

## 2019-08-23 DIAGNOSIS — R2232 Localized swelling, mass and lump, left upper limb: Secondary | ICD-10-CM | POA: Diagnosis present

## 2019-08-23 DIAGNOSIS — Z79899 Other long term (current) drug therapy: Secondary | ICD-10-CM | POA: Insufficient documentation

## 2019-08-23 DIAGNOSIS — I120 Hypertensive chronic kidney disease with stage 5 chronic kidney disease or end stage renal disease: Secondary | ICD-10-CM | POA: Diagnosis not present

## 2019-08-23 DIAGNOSIS — T82838A Hemorrhage of vascular prosthetic devices, implants and grafts, initial encounter: Secondary | ICD-10-CM

## 2019-08-23 DIAGNOSIS — M7989 Other specified soft tissue disorders: Secondary | ICD-10-CM

## 2019-08-23 IMAGING — US US EXTREM UP DUPLEX ARTERIAL*L* LIMITED
1 series · 13 of 13 positions shown · non-contrast
Comparison: None.

CLINICAL DATA: Left upper arm pain and swelling after use of a
brachiocephalic AV fistula during hemodialysis.

EXAM:
LEFT UPPER EXTREMITY ARTERIAL DUPLEX SCAN
TECHNIQUE: Gray-scale sonography as well as color Doppler and duplex ultrasound
was performed to evaluate the arteries of the upper extremity.

[Series 1: us extrem up duplex arterial*left* limited · 13 acquisitions, 13 frames shown]
[im 1/13]
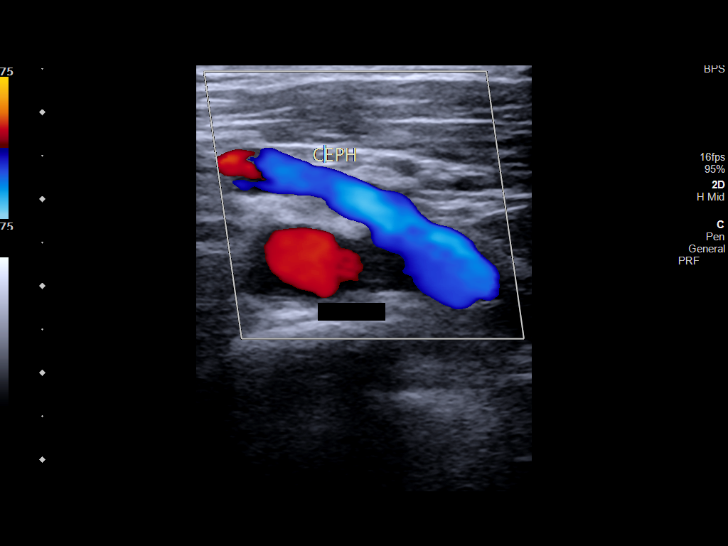
[im 2/13]
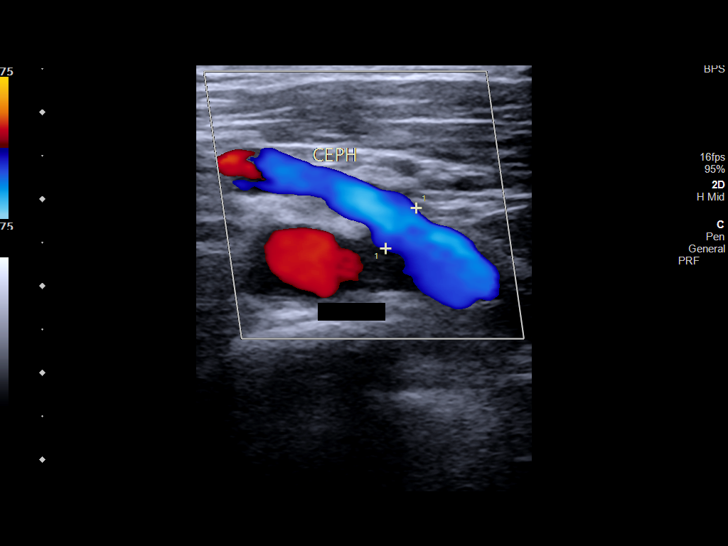
[im 3/13]
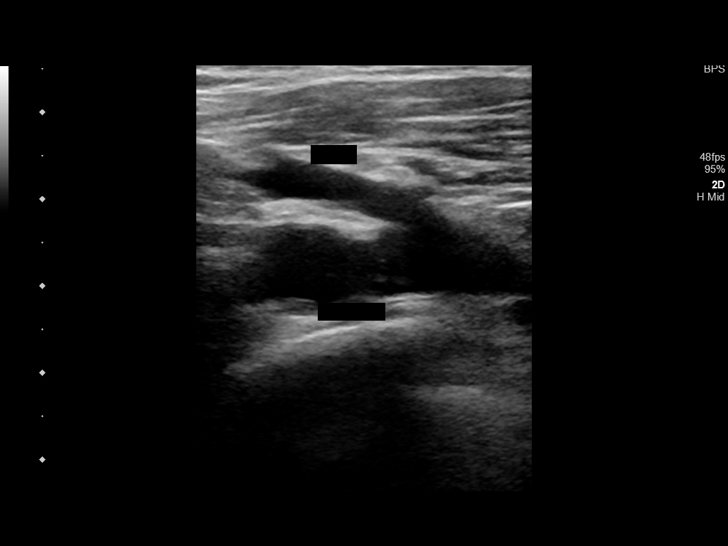
[im 4/13]
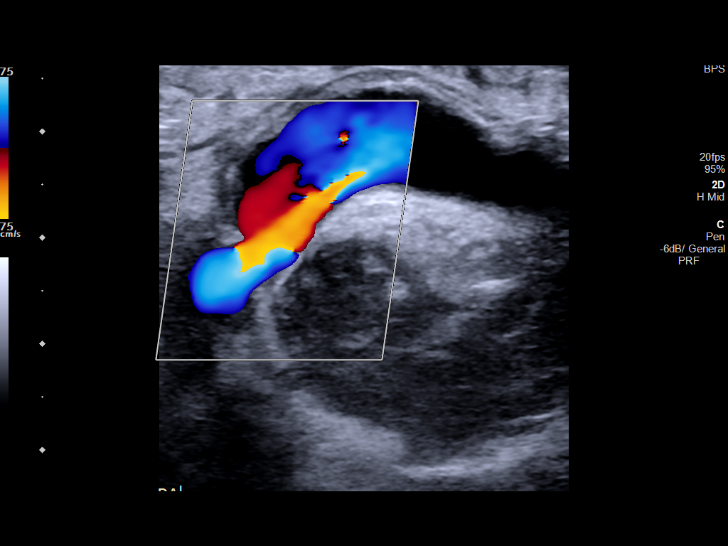
[im 5/13]
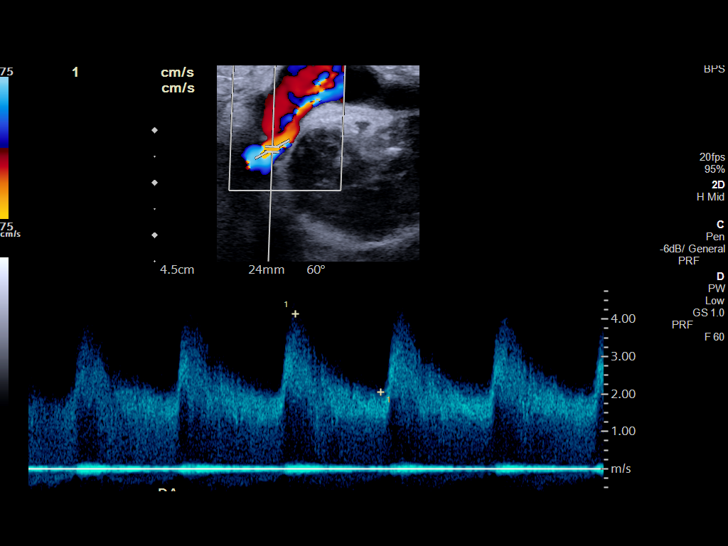
[im 6/13]
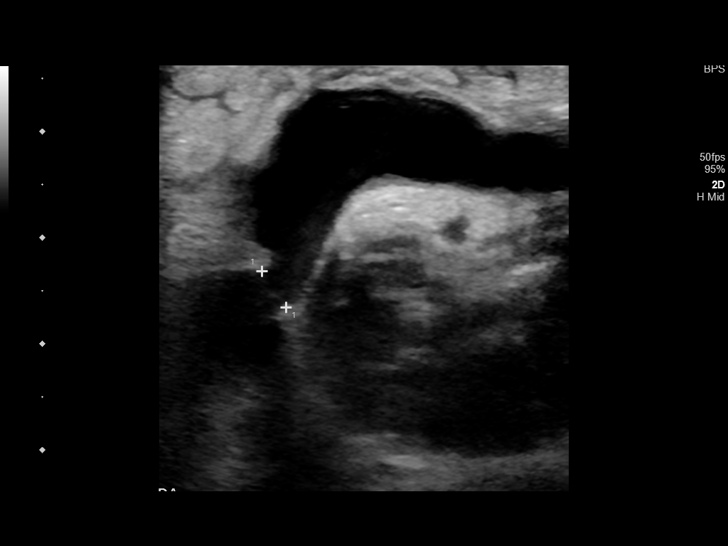
[im 7/13]
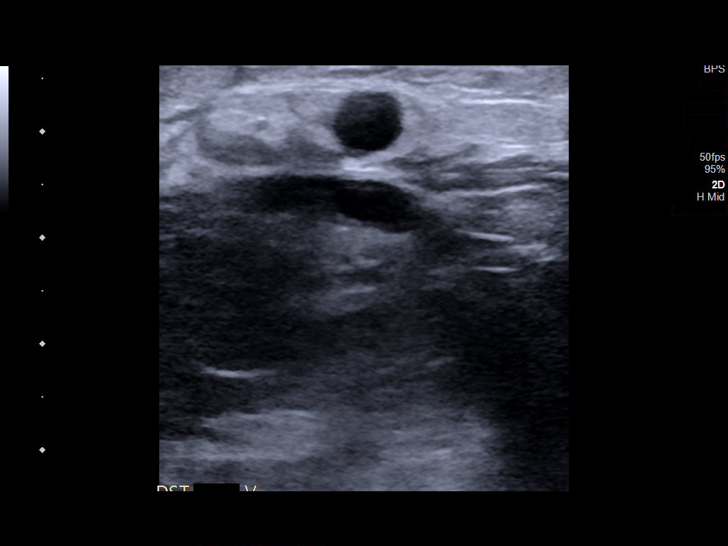
[im 8/13]
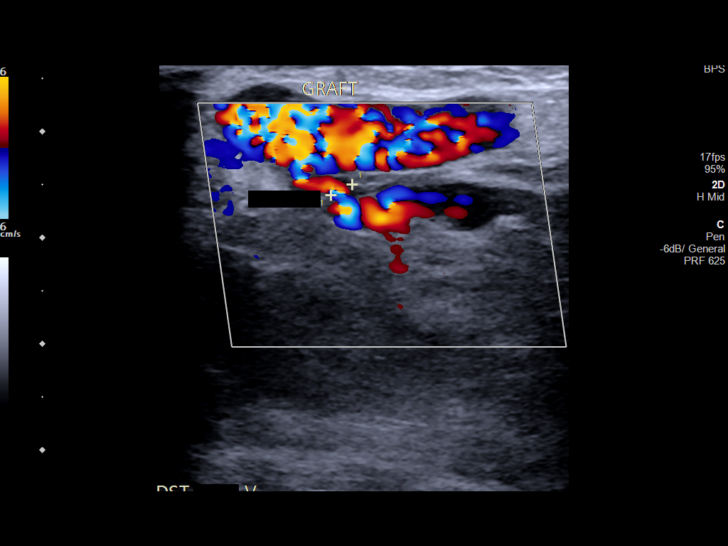
[im 9/13]
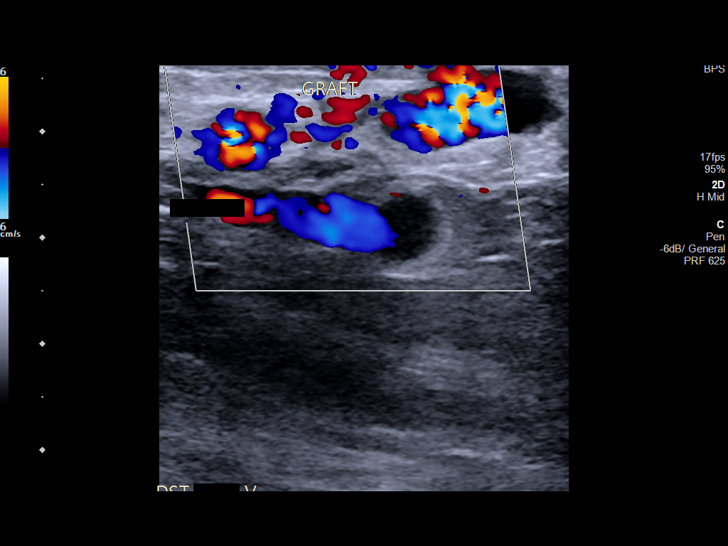
[im 10/13]
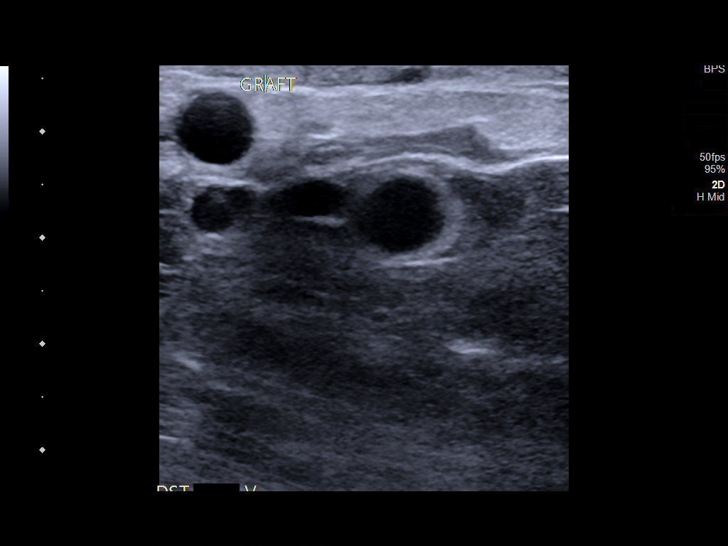
[im 11/13]
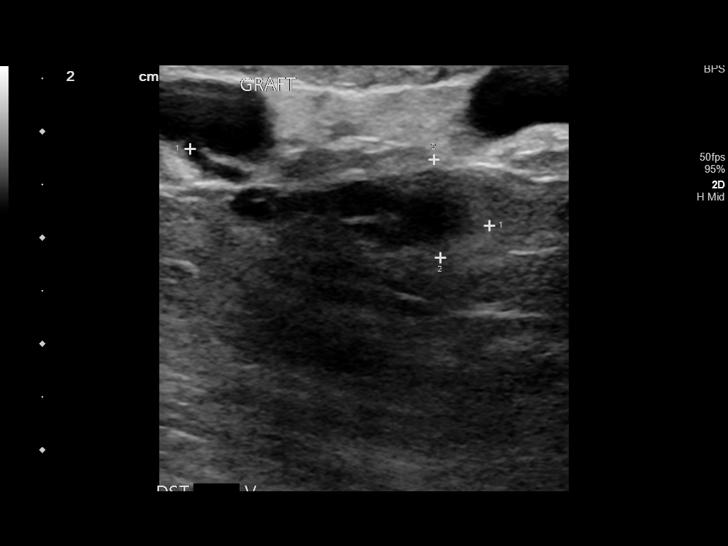
[im 12/13]
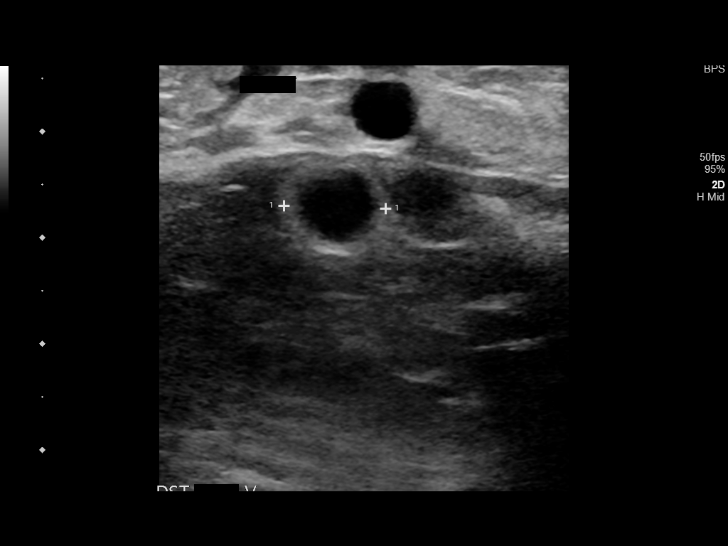
[im 13/13]
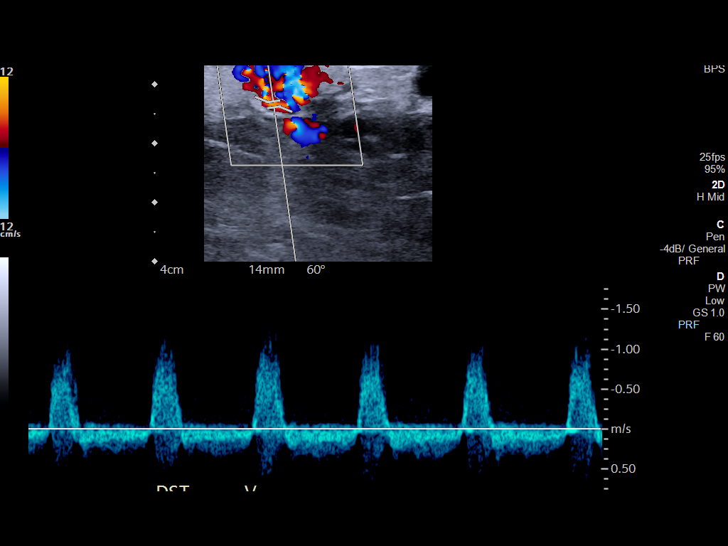

[13 of 13 positions shown; findings below may reference images not displayed]

FINDINGS: Focused evaluation of the left upper arm AV fistula and surrounding
soft tissues was performed. A complete vascular evaluation of the
fistula was not performed.

The brachiocephalic AV fistula is patent. In the distal aspect of
the left upper arm just above the antecubital fossa, there is
potentially an area of contained extravasation/bleeding at the level
of a branch of the cephalic vein located deep to the AV fistula.
Area of potential contained hemorrhage measures only roughly 2 cm in
diameter. No large hematoma is identified.
IMPRESSION: Patent left upper arm brachiocephalic AV fistula. Potential area of
focal contained extravasation/bleeding at the level of a deep branch
of the cephalic vein just above the antecubital fossa. This area
only measures roughly 2 cm in rough diameter and a large hematoma is
not identified.

## 2019-08-23 MED ORDER — HYDROCODONE-ACETAMINOPHEN 5-325 MG PO TABS: 1.0000 | ORAL_TABLET | Freq: Four times a day (QID) | ORAL | 0 refills | Status: AC | PRN

## 2019-08-23 MED ORDER — HYDROCODONE-ACETAMINOPHEN 5-325 MG PO TABS
1.0000 | ORAL_TABLET | Freq: Once | ORAL | Status: AC
  Administered 2019-08-23: 1 via ORAL
  Filled 2019-08-23: qty 1

## 2019-08-23 NOTE — ED Notes (Signed)
Pt returned from [place] at this time. Pt can be heard telling us tech, "I need something for pain, I need that strong pain medicine, not that Tylenol". This RN informed us tech that RN aware.

## 2019-08-23 NOTE — ED Notes (Signed)
Pt presents to ED via POV with c/o left arm pain. Pt states he received approx 2 hrs of his dialysis today, pt with new fistula to L upper arm, pt states was receiving dialysis, had his fistula accessed, dialysis RN told him not to bend his arm, and 2 hrs in he bent his arm. Pt states immediate swelling to L bicep with associated pain. Pt c/o numbness/tingling to L 1st, 2nd, and 3rd digit, pt cap refill < 3 seconds, +movement noted. Pt with noted firmness with palpation to L bicep area, pt with strong radial pulses, + thrill noted on assessment. Pt is alert and oriented upon arrival to ED.

## 2019-08-23 NOTE — Discharge Instructions (Signed)
Alternate between hot and cold packs to your arm. Do these for about ~20 minutes at a time. You can do these a few times a day to help with pain and swelling.   For the next 2 WEEKS your dialysis center should dialyze you from you catheter site in your chest to let the fistula in your arm rest.  Follow-up with your vascular surgeon as needed in the clinic.  Return to the emergency department for worsening pain, swelling, inability to use your hand, weakness, numbness, tingling.

## 2019-08-23 NOTE — ED Notes (Signed)
Unable to obtain discharge signature at this time. Pt verbally consents to discharge and has no questions at this time.

## 2019-08-23 NOTE — ED Notes (Signed)
Patient transported to Ultrasound 

## 2019-08-23 NOTE — ED Notes (Signed)
Warm compresses applied to patient's L upper extremity by this RN. Pt tolerated well.

## 2019-08-23 NOTE — ED Triage Notes (Signed)
Pt here with c/o left arm pain, while at dialysis, while the nurse was working with his left arm, nurse told him not to move his arm while she was working on his fistula, pt bent his arm and instantly began feeling intense pain and swelling. Site wrapped with ice bag wrapped by dialysis RN, pt was able to complete 2 hours of dialysis. Pt denies bleeding after site began to swell. Having some numbness in the fingers on left hand.
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
## 2019-08-27 ENCOUNTER — Other Ambulatory Visit: Payer: Self-pay

## 2019-08-27 ENCOUNTER — Ambulatory Visit
Admission: RE | Admit: 2019-08-27 | Discharge: 2019-08-27 | Disposition: A | Discharge status: Home / Self Care | Payer: Medicare Other | Source: Ambulatory Visit | Attending: Family Medicine | Admitting: Family Medicine

## 2019-08-27 DIAGNOSIS — M5412 Radiculopathy, cervical region: Secondary | ICD-10-CM | POA: Diagnosis present
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
## 2019-09-05 ENCOUNTER — Encounter (INDEPENDENT_AMBULATORY_CARE_PROVIDER_SITE_OTHER): Payer: Self-pay | Admitting: Vascular Surgery

## 2019-09-10 ENCOUNTER — Ambulatory Visit
Admission: RE | Admit: 2019-09-10 | Discharge: 2019-09-10 | Disposition: A | Discharge status: Home / Self Care | Payer: Medicare Other | Source: Ambulatory Visit | Attending: Urology | Admitting: Urology

## 2019-09-10 ENCOUNTER — Other Ambulatory Visit: Payer: Self-pay

## 2019-09-10 DIAGNOSIS — N281 Cyst of kidney, acquired: Secondary | ICD-10-CM | POA: Insufficient documentation

## 2019-09-10 DIAGNOSIS — R3121 Asymptomatic microscopic hematuria: Secondary | ICD-10-CM

## 2019-09-10 IMAGING — CT CT ABD-PEL WO/W CM
3 of 12 series · 11 of 46 positions shown, 17 images · IV contrast (omnipaque)
Comparison: None.

CLINICAL DATA: Microscopic hematuria, ESRD

EXAM:
CT ABDOMEN AND PELVIS WITHOUT AND WITH CONTRAST
TECHNIQUE: Multidetector CT imaging of the abdomen and pelvis was performed
following the standard protocol before and following the bolus
administration of intravenous contrast.
CONTRAST:  125mL OMNIPAQUE IOHEXOL 300 MG/ML  SOLN

[Series 2: axial pre · axial · non-contrast · 0.81mm/px · z∈[-432,-367]mm · 2 of 94 slices shown]
[im 14/94  soft-tissue]
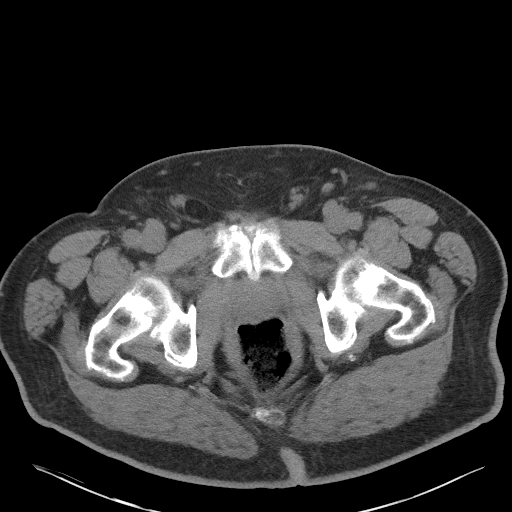
[im 27/94  soft-tissue]
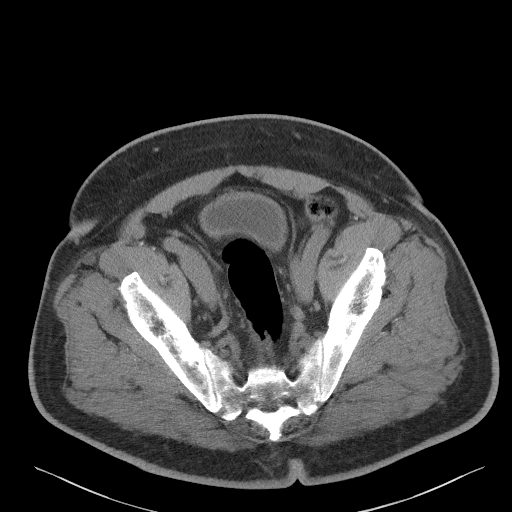

[Series 5: coronal pre · coronal · non-contrast · 0.69mm/px · 2 of 114 slices shown, 3 images]
[im 38/114  soft-tissue]
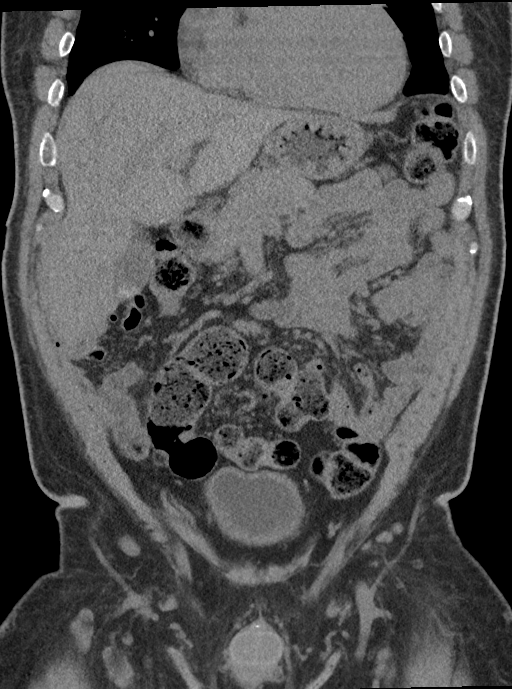
[im 38/114  bone]
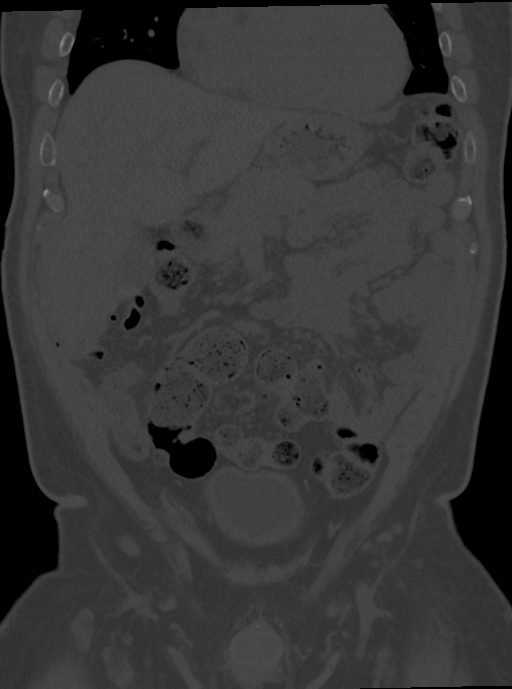
[im 76/114  soft-tissue]
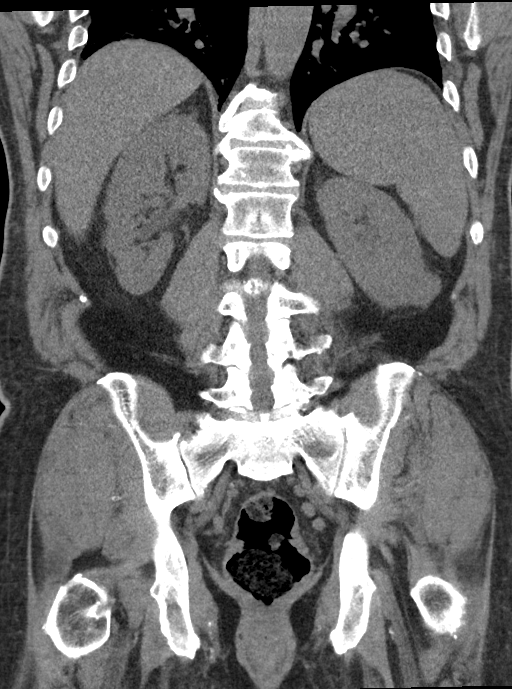

[Series 13: axial delay · axial · delayed · 0.89mm/px · z∈[-523,-163]mm · 7 of 97 slices shown, 12 images]
[im 13/97  soft-tissue]
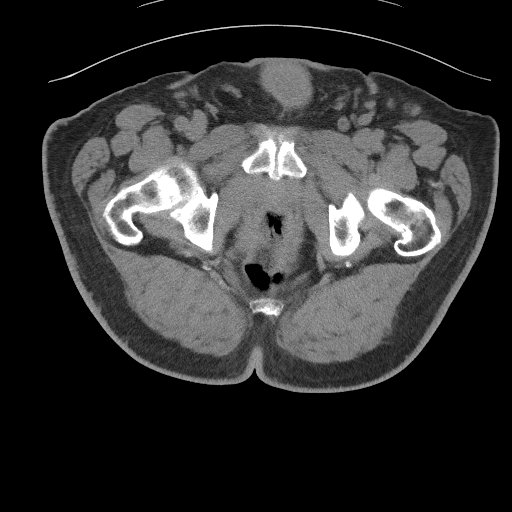
[im 13/97  bone]
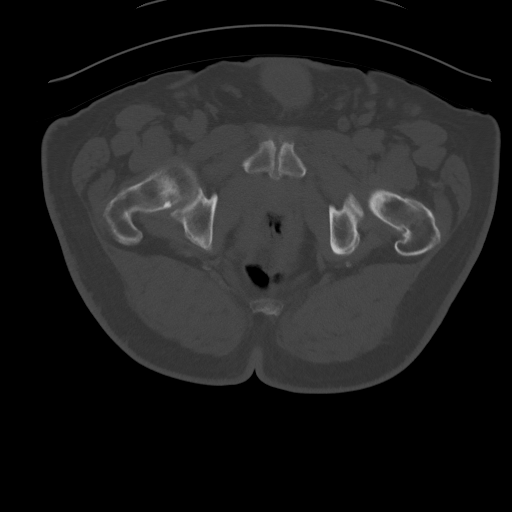
[im 25/97  soft-tissue]
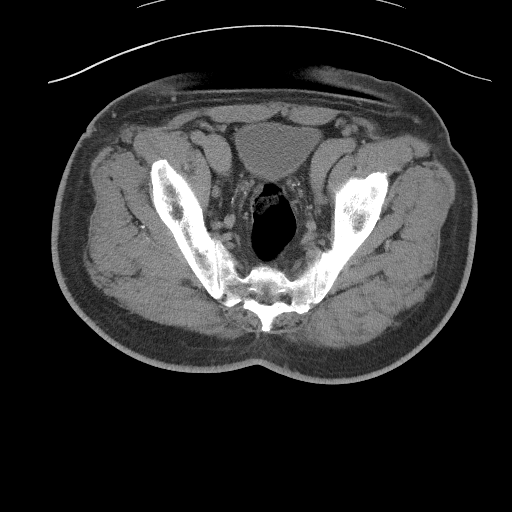
[im 37/97  soft-tissue]
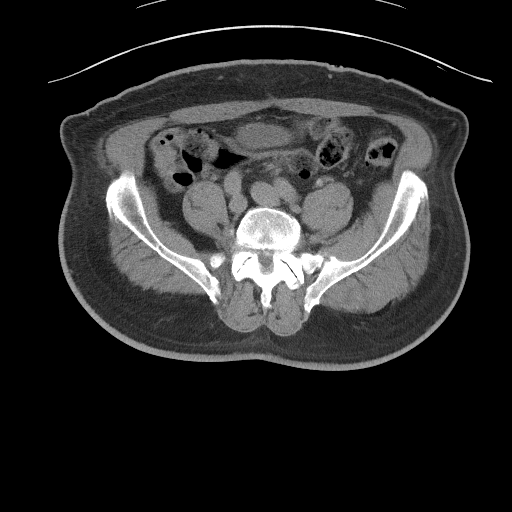
[im 49/97  soft-tissue]
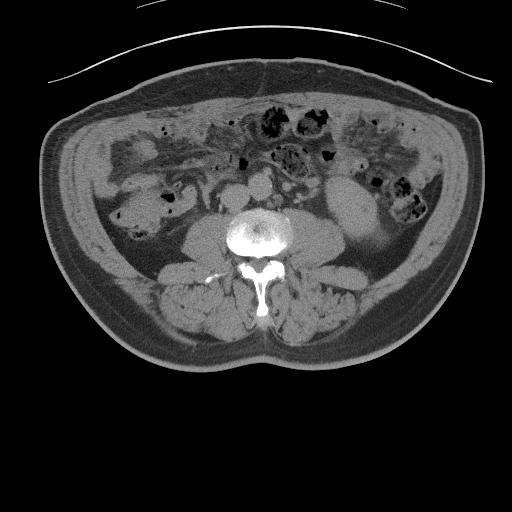
[im 49/97  lung]
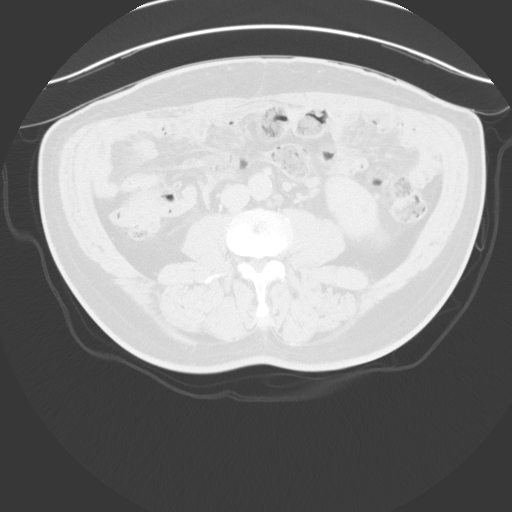
[im 61/97  soft-tissue]
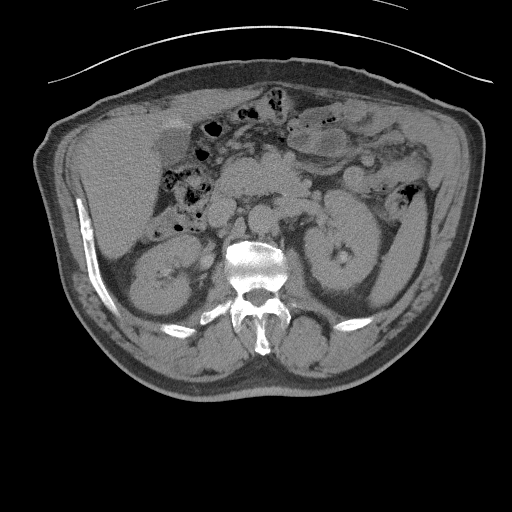
[im 61/97  lung]
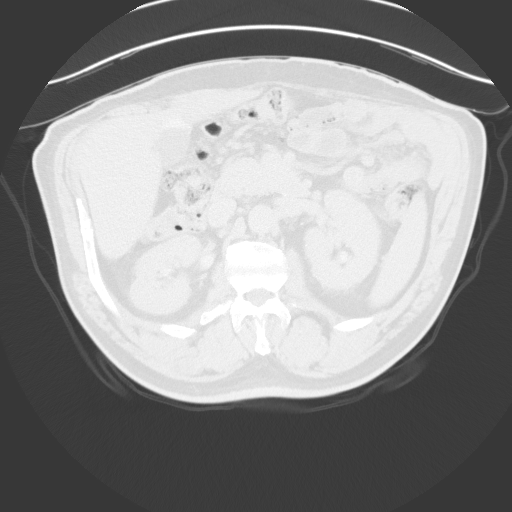
[im 73/97  soft-tissue]
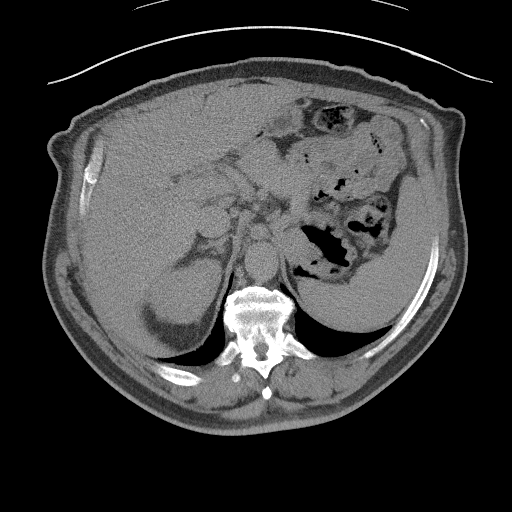
[im 73/97  lung]
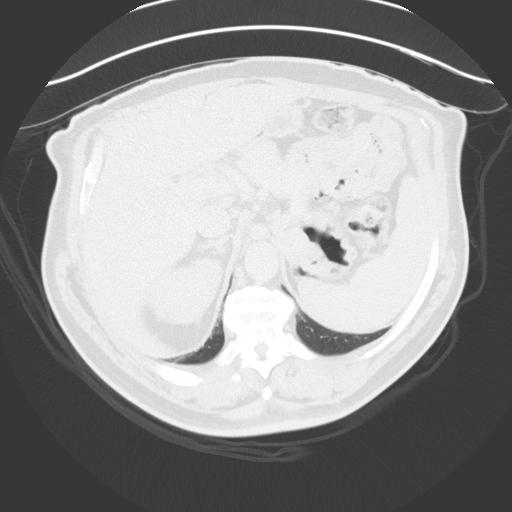
[im 85/97  soft-tissue]
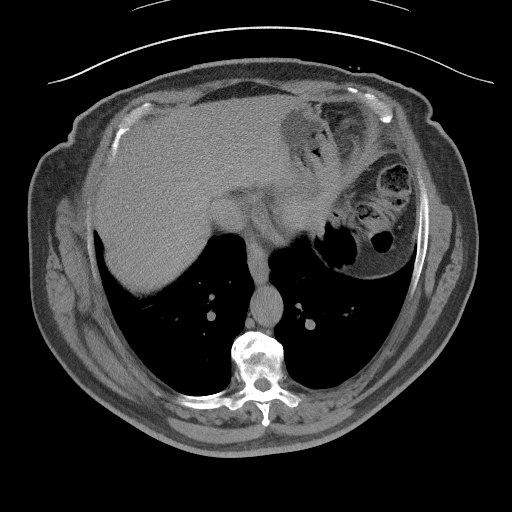
[im 85/97  lung]
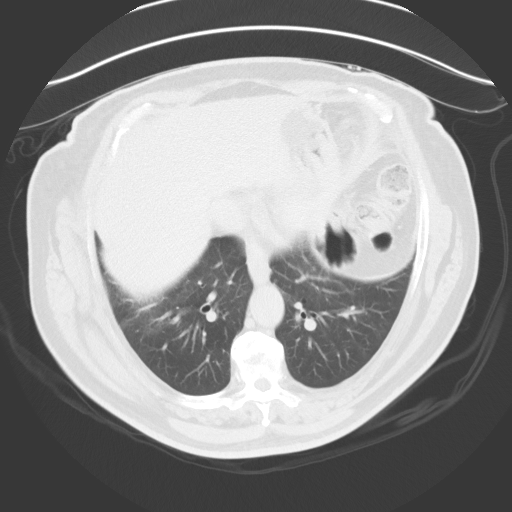

[11 of 46 positions shown; findings below may reference images not displayed]

FINDINGS: Lower chest: No acute abnormality.

Hepatobiliary: No solid liver abnormality is seen. Dependent
calcified sludge and/or tiny gallstones in the gallbladder.
Gallbladder wall thickening, or biliary dilatation.

Pancreas: Unremarkable. No pancreatic ductal dilatation or
surrounding inflammatory changes.

Spleen: Normal in size without significant abnormality.

Adrenals/Urinary Tract: Adrenal glands are unremarkable. Kidneys are
normal, without renal calculi, solid lesion, or hydronephrosis.
Nonenhancing simple cysts of the bilateral kidneys. There is minimal
contrast excretion in the renal pelves and ureters on delayed phase
imaging, in keeping with history of dialysis dependent ESRD. Bladder
is unremarkable.

Stomach/Bowel: Stomach is within normal limits. Appendix appears
normal. No evidence of bowel wall thickening, distention, or
inflammatory changes.

Vascular/Lymphatic: Aortic atherosclerosis. No enlarged abdominal or
pelvic lymph nodes.

Reproductive: No mass or other significant abnormality.

Other: No abdominal wall hernia or abnormality. No abdominopelvic
ascites.

Musculoskeletal: No acute or significant osseous findings.
IMPRESSION: 1. No evidence of calculus or contrast enhancing renal lesion. There
are nonenhancing cysts of the kidneys. No obvious urinary tract
filling defect, however there is minimal contrast excretion in the
renal pelves and ureters on delayed phase imaging, in keeping with
history of dialysis dependent ESRD. No hydronephrosis.
2. There is minimal contrast excretion in the bilateral ureters on
delayed phase imaging, in keeping with history of dialysis dependent
ESRD.
3. Cholelithiasis without evidence of cholecystitis.
4. Aortic Atherosclerosis ([AB]-[AB]).

## 2019-09-10 MED ORDER — IOHEXOL 300 MG/ML  SOLN
125.0000 mL | Freq: Once | INTRAMUSCULAR | Status: AC | PRN
  Administered 2019-09-10: 12:00:00 125 mL via INTRAVENOUS

## 2019-09-13 ENCOUNTER — Other Ambulatory Visit: Payer: Medicare Other | Admitting: Urology

## 2019-09-14 ENCOUNTER — Encounter: Payer: Self-pay | Admitting: Urology

## 2019-09-27 ENCOUNTER — Telehealth (INDEPENDENT_AMBULATORY_CARE_PROVIDER_SITE_OTHER): Payer: Self-pay

## 2019-09-27 NOTE — Telephone Encounter (Signed)
A fax was received for a permcath removal for this patient. Patient has been scheduled with Dr. Delana Meyer for permcath removal on 10/03/19 with a 1:45 pm arrival time to the [facility]. Patient will do covid testing on 10/01/19 between 12:30-2:30 pm at the [facility]. Pre-procedure instructions will be faxed back to [facility] [place].

## 2019-10-01 ENCOUNTER — Other Ambulatory Visit
Admission: RE | Admit: 2019-10-01 | Discharge: 2019-10-01 | Disposition: A | Discharge status: Home / Self Care | Payer: Medicare Other | Source: Ambulatory Visit | Attending: Vascular Surgery | Admitting: Vascular Surgery

## 2019-10-01 ENCOUNTER — Other Ambulatory Visit: Payer: Self-pay

## 2019-10-01 DIAGNOSIS — Z01812 Encounter for preprocedural laboratory examination: Secondary | ICD-10-CM | POA: Insufficient documentation

## 2019-10-01 DIAGNOSIS — Z20822 Contact with and (suspected) exposure to covid-19: Secondary | ICD-10-CM | POA: Diagnosis not present

## 2019-10-02 LAB — SARS CORONAVIRUS 2 (TAT 6-24 HRS): SARS Coronavirus 2: NEGATIVE

## 2019-10-03 ENCOUNTER — Ambulatory Visit
Admission: RE | Admit: 2019-10-03 | Discharge: 2019-10-03 | Disposition: A | Discharge status: Home / Self Care | Payer: Medicare Other | Attending: Vascular Surgery | Admitting: Vascular Surgery

## 2019-10-03 ENCOUNTER — Other Ambulatory Visit: Payer: Self-pay

## 2019-10-03 ENCOUNTER — Encounter
Admission: RE | Disposition: A | Discharge status: Home / Self Care | Payer: Self-pay | Source: Home / Self Care | Attending: Vascular Surgery

## 2019-10-03 ENCOUNTER — Encounter: Payer: Self-pay | Admitting: Cardiology

## 2019-10-03 ENCOUNTER — Other Ambulatory Visit (INDEPENDENT_AMBULATORY_CARE_PROVIDER_SITE_OTHER): Payer: Self-pay | Admitting: Nurse Practitioner

## 2019-10-03 DIAGNOSIS — Z8249 Family history of ischemic heart disease and other diseases of the circulatory system: Secondary | ICD-10-CM | POA: Diagnosis not present

## 2019-10-03 DIAGNOSIS — T82868A Thrombosis of vascular prosthetic devices, implants and grafts, initial encounter: Secondary | ICD-10-CM | POA: Diagnosis present

## 2019-10-03 DIAGNOSIS — E1122 Type 2 diabetes mellitus with diabetic chronic kidney disease: Secondary | ICD-10-CM | POA: Insufficient documentation

## 2019-10-03 DIAGNOSIS — N186 End stage renal disease: Secondary | ICD-10-CM | POA: Insufficient documentation

## 2019-10-03 DIAGNOSIS — Z992 Dependence on renal dialysis: Secondary | ICD-10-CM | POA: Insufficient documentation

## 2019-10-03 DIAGNOSIS — Z95828 Presence of other vascular implants and grafts: Secondary | ICD-10-CM | POA: Insufficient documentation

## 2019-10-03 DIAGNOSIS — Z6834 Body mass index (BMI) 34.0-34.9, adult: Secondary | ICD-10-CM | POA: Diagnosis not present

## 2019-10-03 DIAGNOSIS — I2510 Atherosclerotic heart disease of native coronary artery without angina pectoris: Secondary | ICD-10-CM | POA: Insufficient documentation

## 2019-10-03 DIAGNOSIS — Z885 Allergy status to narcotic agent status: Secondary | ICD-10-CM | POA: Diagnosis not present

## 2019-10-03 DIAGNOSIS — I120 Hypertensive chronic kidney disease with stage 5 chronic kidney disease or end stage renal disease: Secondary | ICD-10-CM | POA: Diagnosis not present

## 2019-10-03 DIAGNOSIS — E11649 Type 2 diabetes mellitus with hypoglycemia without coma: Secondary | ICD-10-CM | POA: Diagnosis not present

## 2019-10-03 DIAGNOSIS — M170 Bilateral primary osteoarthritis of knee: Secondary | ICD-10-CM | POA: Diagnosis not present

## 2019-10-03 HISTORY — PX: DIALYSIS/PERMA CATHETER REMOVAL: CATH118289

## 2019-10-03 SURGERY — DIALYSIS/PERMA CATHETER REMOVAL
Anesthesia: LOCAL

## 2019-10-03 MED ORDER — LIDOCAINE-EPINEPHRINE (PF) 1 %-1:200000 IJ SOLN
INTRAMUSCULAR | Status: DC | PRN
  Administered 2019-10-03: 20 mL via INTRADERMAL

## 2019-10-03 SURGICAL SUPPLY — 2 items
FORCEPS HALSTEAD CVD 5IN STRL (INSTRUMENTS) ×2 IMPLANT
TRAY LACERAT/PLASTIC (MISCELLANEOUS) ×2 IMPLANT
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
## 2019-10-03 NOTE — Op Note (Signed)
  OPERATIVE NOTE   PROCEDURE: 1. Removal of a right IJ tunneled dialysis catheter  PRE-OPERATIVE DIAGNOSIS: Complication of dialysis catheter, End stage renal disease  POST-OPERATIVE DIAGNOSIS: Same  SURGEON: [name], M.D.  ANESTHESIA: Local anesthetic with 1% lidocaine with epinephrine   ESTIMATED BLOOD LOSS: Minimal   FINDING(S): 1. Catheter intact   SPECIMEN(S):  Catheter  INDICATIONS:   Steve Vance is a 59 y.o. male who presents with a functioning left arm fistula and a nonfunctioning catheter.  The patient has undergone placement of an extremity access which is working and this has been successfully cannulated without difficulty.  therefore is undergoing removal of his tunneled catheter which is no longer needed to avoid septic complications.   DESCRIPTION: After obtaining full informed written consent, the patient was positioned supine. The right IJ catheter and surrounding area is prepped and draped in a sterile fashion. The cuff was localized by palpation and noted to be less than 3 cm from the exit site. After appropriate timeout is called, 1% lidocaine with epinephrine is infiltrated into the surrounding tissues around the cuff. Small transverse incision is created at the exit site with an 11 blade scalpel and the dissection was carried up along the catheter to expose the cuff of the tunneled catheter.  The catheter cuff is then freed from the surrounding attachments and adhesions. Once the catheter has been freed circumferentially it is removed in 1 piece. Light pressure was held at the base of the neck.   Antibiotic ointment and a sterile dressing is applied to the exit site. Patient tolerated procedure well and there were no complications.  COMPLICATIONS: None  CONDITION: Unchanged  [name], M.D. [facility] Office: [phone]  10/03/2019,3:21 PM

## 2019-10-03 NOTE — Discharge Instructions (Signed)
Tunneled Catheter Removal, Care After °Refer to this sheet in the next few weeks. These instructions provide you with information about caring for yourself after your procedure. Your health care provider may also give you more specific instructions. Your treatment has been planned according to current medical practices, but problems sometimes occur. Call your health care provider if you have any problems or questions after your procedure. °What can I expect after the procedure? °After the procedure, it is common to have: °· Some mild redness, swelling, and pain around your catheter site. ° ° °Follow these instructions at home: °Incision care  °· Check your removal site  every day for signs of infection. Check for: °¨ More redness, swelling, or pain. °¨ More fluid or blood. °¨ Warmth. °¨ Pus or a bad smell. °· Follow instructions from your health care provider about how to take care of your removal site. Make sure you: °¨ Wash your hands with soap and water before you change your bandages (dressings). If soap and water are not available, use hand sanitizer. °Activity  °· Return to your normal activities as told by your health care provider. Ask your health care provider what activities are safe for you. °· Do not lift anything that is heavier than 10 lb (4.5 kg) for 3 weeks or as long as told by your health care provider. ° °Contact a health care provider if: °· You have more fluid or blood coming from your removal site °· You have more redness, swelling, or pain at your incisions or around the area where your catheter was removed °· Your removal site feel warm to the touch. °· You feel unusually weak. °· You feel nauseous.. °· Get help right away if °· You have swelling in your arm, shoulder, neck, or face. °· You develop chest pain. °· You have difficulty breathing. °· You feel dizzy or light-headed. °· You have pus or a bad smell coming from your removal site °· You have a fever. °· You develop bleeding from your  removal site, and your bleeding does not stop. °This information is not intended to replace advice given to you by your health care provider. Make sure you discuss any questions you have with your health care provider. °Document Released: 08/09/2012 Document Revised: 04/25/2016 Document Reviewed: 05/19/2015 °[employer] Interactive Patient Education © 2017 [employer] ° °

## 2019-10-24 LAB — HEPATITIS B SURFACE ANTIGEN

## 2019-11-05 ENCOUNTER — Ambulatory Visit: Payer: Medicare Other | Admitting: Podiatry
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
## 2019-11-13 ENCOUNTER — Telehealth: Payer: Self-pay

## 2019-11-13 DIAGNOSIS — E669 Obesity, unspecified: Secondary | ICD-10-CM | POA: Insufficient documentation

## 2019-11-13 NOTE — Patient Instructions (Addendum)
______________________________________________________________________________________________  Weight Management Required  URGENT: Your weight has been found to be adversely affecting your health.  Dear Steve Vance:  Your current Estimated body mass index is 34.59 kg/m as calculated from the following:   Height as of 10/03/19: $RemoveBef'5\' 11"'zigHliTIxz$  (1.803 m).   Weight as of 10/03/19: 248 lb (112.5 kg).  Please use the table below to identify your weight category and associated incidence of chronic pain, secondary to your weight.  Body Mass Index (BMI) Classification BMI level (kg/m2) Category Associated incidence of chronic pain  <18  Underweight   18.5-24.9 Ideal body weight   25-29.9 Overweight  20%  30-34.9 Obese (Class I)  68%  35-39.9 Severe obesity (Class II)  136%  >40 Extreme obesity (Class III)  254%   In addition: You will be considered "Morbidly Obese", if your BMI is above 30 and you have one or more of the following conditions which are known to be directly associated with obesity: 1.    Type 2 Diabetes (Which in turn can lead to cardiovascular diseases (CVD), stroke, peripheral vascular diseases (PVD), retinopathy, nephropathy, and neuropathy) 2.    Cardiovascular Disease (High Blood Pressure; Congestive Heart Failure; High Cholesterol; Coronary Artery Disease; Angina; or History of Heart Attacks) 3.    Breathing problems (Asthma; obesity-hypoventilation syndrome; obstructive sleep apnea; chronic inflammatory airway disease; reactive airway disease; or shortness of breath) 4.    Chronic kidney disease 5.    Liver disease (nonalcoholic fatty liver disease) 6.    High blood pressure 7.    Acid reflux (gastroesophageal reflux disease; heartburn) 8.    Osteoarthritis (OA) (with any of the following: hip pain; knee pain; and/or low back pain) 9.    Low back pain (Lumbar Facet Syndrome; and/or Degenerative Disc Disease) 10.  Hip pain (Osteoarthritis of hip) (For every 1 lbs of added body  weight, there is a 2 lbs increase in pressure inside of each hip articulation. 1:2 mechanical relationship) 11.  Knee pain (Osteoarthritis of knee) (For every 1 lbs of added body weight, there is a 4 lbs increase in pressure inside of each knee articulation. 1:4 mechanical relationship) (patients with a BMI>30 kg/m2 were 6.8 times more likely to develop knee OA than normal-weight individuals) 12.  Cancer. Epidemiological studies have shown that obesity is a risk factor for: post-menopausal breast cancer; cancers of the endometrium, colon and kidney cancer; malignant adenomas of the oesophagus. Obese subjects have an approximately 1.5-3.5-fold increased risk of developing these cancers compared with normal-weight subjects, and it has been estimated that between 15 and 45% of these cancers can be attributed to overweight. More recent studies suggest that obesity may also increase the risk of other types of cancer, including pancreatic, hepatic and gallbladder cancer. (Ref: Obesity and cancer. Pischon T, Nthlings U, [employer] H. Proc Nutr Soc. 2008 May;67(2):128-45. doi: 89.3734/[id number].)  Recommendation: At this point it is urgent that you take a step back and concentrate in loosing weight. Dedicate 100% of your efforts on this task. Nothing else will improve your health more than bringing down your BMI to less than 30. Because most chronic pain patients do have difficulty exercising secondary to their pain, you must rely on proper nutrition and dieting in order to lose the weight. If your BMI is above 40, you should seriously consider bariatric surgery. A realistic goal is to lose 10% of your body weight over a period of 12 months.  If over time you have unsuccessfully try to lose weight, then it  is time for you to seek professional help and to enter a medically supervised weight management program.  Pain management considerations:  1.    Pharmacological Problems: Be advised that the use of opioid  analgesics (oxycodone; hydrocodone; morphine; methadone; codeine; and all of their derivatives) have been associated with decreased metabolism and weight gain.  For this reason, should we see that you are unable to lose weight while taking these medications, it may become necessary for [place] to taper down and indefinitely discontinue them.  2.    Technical Problems: The incidence of successful interventional therapies decreases as the patient's BMI increases. It is much more difficult to accomplish a safe and effective interventional therapy on a patient with a BMI above 35. 3.    Radiation Exposure Problems: The x-rays machine, used to accomplish injection therapies, will automatically increase their x-ray output in order to capture an appropriate bone image. This means that radiation exposure increases exponentially with the patient's BMI. (The higher the BMI, the higher the radiation exposure.) Although the level of radiation used at a given time is still safe to the patient, it is not for the physician and/or assisting staff. Unfortunately, radiation exposure is accumulative. Because physicians and the staff have to do procedures and be exposed on a daily basis, this can result in health problems such as cancer and radiation burns. Radiation exposure to the staff is monitored by the radiation batches that they wear. The exposure levels are reported back to the staff on a quarterly basis. Depending on levels of exposure, physicians and staff may be obligated by law to decrease this exposure. This means that they have the right and obligation to refuse providing therapies where they may be overexposed to radiation. For this reason, physicians may decline to offer therapies such as radiofrequency ablation or implants to patients with a BMI above 40. 4.    Current Trends: Be advised that the current trend is to no longer offer certain therapies to patients with a BMI equal to, or above 35, due to increase  perioperative risks, increased technical procedural difficulties, and excessive radiation exposure to healthcare personnel.  ______________________________________________________________________________________________    ____________________________________________________________________________________________  General Risks and Possible Complications  Patient Responsibilities: It is important that you read this as it is part of your informed consent. It is our duty to inform you of the risks and possible complications associated with treatments offered to you. It is your responsibility as a patient to read this and to ask questions about anything that is not clear or that you believe was not covered in this document.  Patient's Rights: You have the right to refuse treatment. You also have the right to change your mind, even after initially having agreed to have the treatment done. However, under this last option, if you wait until the last second to change your mind, you may be charged for the materials used up to that point.  Introduction: Medicine is not an Chief Strategy Officer. Everything in Medicine, including the lack of treatment(s), carries the potential for danger, harm, or loss (which is by definition: Risk). In Medicine, a complication is a secondary problem, condition, or disease that can aggravate an already existing one. All treatments carry the risk of possible complications. The fact that a side effects or complications occurs, does not imply that the treatment was conducted incorrectly. It must be clearly understood that these can happen even when everything is done following the highest safety standards.  No treatment: You can choose  not to proceed with the proposed treatment alternative. The "PRO(s)" would include: avoiding the risk of complications associated with the therapy. The "CON(s)" would include: not getting any of the treatment benefits. These benefits fall under one of  three categories: diagnostic; therapeutic; and/or palliative. Diagnostic benefits include: getting information which can ultimately lead to improvement of the disease or symptom(s). Therapeutic benefits are those associated with the successful treatment of the disease. Finally, palliative benefits are those related to the decrease of the primary symptoms, without necessarily curing the condition (example: decreasing the pain from a flare-up of a chronic condition, such as incurable terminal cancer).  General Risks and Complications: These are associated to most interventional treatments. They can occur alone, or in combination. They fall under one of the following six (6) categories: no benefit or worsening of symptoms; bleeding; infection; nerve damage; allergic reactions; and/or death. 1. No benefits or worsening of symptoms: In Medicine there are no guarantees, only probabilities. No healthcare provider can ever guarantee that a medical treatment will work, they can only state the probability that it may. Furthermore, there is always the possibility that the condition may worsen, either directly, or indirectly, as a consequence of the treatment. 2. Bleeding: This is more common if the patient is taking a blood thinner, either prescription or over the counter (example: Goody Powders, Fish oil, Aspirin, Garlic, etc.), or if suffering a condition associated with impaired coagulation (example: Hemophilia, cirrhosis of the liver, low platelet counts, etc.). However, even if you do not have one on these, it can still happen. If you have any of these conditions, or take one of these drugs, make sure to notify your treating physician. 3. Infection: This is more common in patients with a compromised immune system, either due to disease (example: diabetes, cancer, human immunodeficiency virus [HIV], etc.), or due to medications or treatments (example: therapies used to treat cancer and rheumatological diseases).  However, even if you do not have one on these, it can still happen. If you have any of these conditions, or take one of these drugs, make sure to notify your treating physician. 4. Nerve Damage: This is more common when the treatment is an invasive one, but it can also happen with the use of medications, such as those used in the treatment of cancer. The damage can occur to small secondary nerves, or to large primary ones, such as those in the spinal cord and brain. This damage may be temporary or permanent and it may lead to impairments that can range from temporary numbness to permanent paralysis and/or brain death. 5. Allergic Reactions: Any time a substance or material comes in contact with our body, there is the possibility of an allergic reaction. These can range from a mild skin rash (contact dermatitis) to a severe systemic reaction (anaphylactic reaction), which can result in death. 6. Death: In general, any medical intervention can result in death, most of the time due to an unforeseen complication. ____________________________________________________________________________________________  ____________________________________________________________________________________________  Preparing for Procedure with Sedation  Procedure appointments are limited to planned procedures: . No Prescription Refills. . No disability issues will be discussed. . No medication changes will be discussed.  Instructions: . Oral Intake: Do not eat or drink anything for at least 8 hours prior to your procedure. . Transportation: Public transportation is not allowed. Bring an adult driver. The driver must be physically present in our waiting room before any procedure can be started. Marland Kitchen Physical Assistance: Bring an adult physically capable of assisting  you, in the event you need help. This adult should keep you company at home for at least 6 hours after the procedure. . Blood Pressure Medicine: Take your  blood pressure medicine with a sip of water the morning of the procedure. . Blood thinners: Notify our staff if you are taking any blood thinners. Depending on which one you take, there will be specific instructions on how and when to stop it. . Diabetics on insulin: Notify the staff so that you can be scheduled 1st case in the morning. If your diabetes requires high dose insulin, take only  of your normal insulin dose the morning of the procedure and notify the staff that you have done so. . Preventing infections: Shower with an antibacterial soap the morning of your procedure. . Build-up your immune system: Take 1000 mg of Vitamin C with every meal (3 times a day) the day prior to your procedure. Marland Kitchen Antibiotics: Inform the staff if you have a condition or reason that requires you to take antibiotics before dental procedures. . Pregnancy: If you are pregnant, call and cancel the procedure. . Sickness: If you have a cold, fever, or any active infections, call and cancel the procedure. . Arrival: You must be in the facility at least 30 minutes prior to your scheduled procedure. . Children: Do not bring children with you. . Dress appropriately: Bring dark clothing that you would not mind if they get stained. . Valuables: Do not bring any jewelry or valuables.  Reasons to call and reschedule or cancel your procedure: (Following these recommendations will minimize the risk of a serious complication.) . Surgeries: Avoid having procedures within 2 weeks of any surgery. (Avoid for 2 weeks before or after any surgery). . Flu Shots: Avoid having procedures within 2 weeks of a flu shots or . (Avoid for 2 weeks before or after immunizations). . Barium: Avoid having a procedure within 7-10 days after having had a radiological study involving the use of radiological contrast. (Myelograms, Barium swallow or enema study). . Heart attacks: Avoid any elective procedures or surgeries for the initial 6 months after a  "Myocardial Infarction" (Heart Attack). . Blood thinners: It is imperative that you stop these medications before procedures. Let us know if you if you take any blood thinner.  . Infection: Avoid procedures during or within two weeks of an infection (including chest colds or gastrointestinal problems). Symptoms associated with infections include: Localized redness, fever, chills, night sweats or profuse sweating, burning sensation when voiding, cough, congestion, stuffiness, runny nose, sore throat, diarrhea, nausea, vomiting, cold or Flu symptoms, recent or current infections. It is specially important if the infection is over the area that we intend to treat. Marland Kitchen Heart and lung problems: Symptoms that may suggest an active cardiopulmonary problem include: cough, chest pain, breathing difficulties or shortness of breath, dizziness, ankle swelling, uncontrolled high or unusually low blood pressure, and/or palpitations. If you are experiencing any of these symptoms, cancel your procedure and contact your primary care physician for an evaluation.  Remember:  Regular Business hours are:  Monday to Thursday 8:00 AM to 4:00 PM  Provider's Schedule: Milinda Pointer, MD:  Procedure days: Tuesday and Thursday 7:30 AM to 4:00 PM  Gillis Santa, MD:  Procedure days: Monday and Wednesday 7:30 AM to 4:00 PM ____________________________________________________________________________________________

## 2019-11-13 NOTE — Telephone Encounter (Signed)
Attempted to call patient for new patient questionaire.  There is no voicemail set up so unable to leave a message.

## 2019-11-14 ENCOUNTER — Ambulatory Visit: Payer: Medicare Other | Attending: Pain Medicine | Admitting: Pain Medicine

## 2019-11-14 ENCOUNTER — Encounter: Payer: Self-pay | Admitting: Pain Medicine

## 2019-11-14 ENCOUNTER — Telehealth: Payer: Self-pay | Admitting: *Deleted

## 2019-11-14 ENCOUNTER — Other Ambulatory Visit: Payer: Self-pay

## 2019-11-14 DIAGNOSIS — N186 End stage renal disease: Secondary | ICD-10-CM

## 2019-11-14 DIAGNOSIS — M5137 Other intervertebral disc degeneration, lumbosacral region: Secondary | ICD-10-CM

## 2019-11-14 DIAGNOSIS — M79604 Pain in right leg: Secondary | ICD-10-CM

## 2019-11-14 DIAGNOSIS — R937 Abnormal findings on diagnostic imaging of other parts of musculoskeletal system: Secondary | ICD-10-CM | POA: Diagnosis not present

## 2019-11-14 DIAGNOSIS — M5030 Other cervical disc degeneration, unspecified cervical region: Secondary | ICD-10-CM

## 2019-11-14 DIAGNOSIS — M47812 Spondylosis without myelopathy or radiculopathy, cervical region: Secondary | ICD-10-CM | POA: Insufficient documentation

## 2019-11-14 DIAGNOSIS — M4802 Spinal stenosis, cervical region: Secondary | ICD-10-CM

## 2019-11-14 DIAGNOSIS — M545 Low back pain, unspecified: Secondary | ICD-10-CM | POA: Insufficient documentation

## 2019-11-14 DIAGNOSIS — M48062 Spinal stenosis, lumbar region with neurogenic claudication: Secondary | ICD-10-CM | POA: Diagnosis not present

## 2019-11-14 DIAGNOSIS — Z992 Dependence on renal dialysis: Secondary | ICD-10-CM | POA: Insufficient documentation

## 2019-11-14 DIAGNOSIS — M79605 Pain in left leg: Secondary | ICD-10-CM

## 2019-11-14 DIAGNOSIS — M4807 Spinal stenosis, lumbosacral region: Secondary | ICD-10-CM

## 2019-11-14 DIAGNOSIS — Z87828 Personal history of other (healed) physical injury and trauma: Secondary | ICD-10-CM | POA: Insufficient documentation

## 2019-11-14 DIAGNOSIS — M4310 Spondylolisthesis, site unspecified: Secondary | ICD-10-CM

## 2019-11-14 DIAGNOSIS — G8929 Other chronic pain: Secondary | ICD-10-CM | POA: Insufficient documentation

## 2019-11-14 DIAGNOSIS — M542 Cervicalgia: Secondary | ICD-10-CM

## 2019-11-14 DIAGNOSIS — M47816 Spondylosis without myelopathy or radiculopathy, lumbar region: Secondary | ICD-10-CM

## 2019-11-14 DIAGNOSIS — M7918 Myalgia, other site: Secondary | ICD-10-CM

## 2019-11-14 DIAGNOSIS — M25561 Pain in right knee: Secondary | ICD-10-CM

## 2019-11-14 NOTE — Progress Notes (Signed)
No diabetes medication on med list. Asked patient about this, he does not know what he takes.

## 2019-11-14 NOTE — Telephone Encounter (Signed)
Attempted to call for pre appointment review of allergies/meds. Unable to leave a message, voicemail net set up.

## 2019-11-22 ENCOUNTER — Ambulatory Visit (HOSPITAL_BASED_OUTPATIENT_CLINIC_OR_DEPARTMENT_OTHER): Payer: Medicare Other | Admitting: Pain Medicine

## 2019-11-22 ENCOUNTER — Ambulatory Visit
Admission: RE | Admit: 2019-11-22 | Discharge: 2019-11-22 | Disposition: A | Discharge status: Home / Self Care | Payer: Medicare Other | Source: Ambulatory Visit | Attending: Pain Medicine | Admitting: Pain Medicine

## 2019-11-22 ENCOUNTER — Encounter: Payer: Self-pay | Admitting: Pain Medicine

## 2019-11-22 ENCOUNTER — Other Ambulatory Visit: Payer: Self-pay

## 2019-11-22 VITALS — BP 133/77 | HR 80 | Temp 98.2°F | Resp 22 | Ht 71.0 in | Wt 240.0 lb

## 2019-11-22 DIAGNOSIS — R937 Abnormal findings on diagnostic imaging of other parts of musculoskeletal system: Secondary | ICD-10-CM | POA: Insufficient documentation

## 2019-11-22 DIAGNOSIS — M48062 Spinal stenosis, lumbar region with neurogenic claudication: Secondary | ICD-10-CM

## 2019-11-22 DIAGNOSIS — N186 End stage renal disease: Secondary | ICD-10-CM | POA: Diagnosis present

## 2019-11-22 DIAGNOSIS — M5137 Other intervertebral disc degeneration, lumbosacral region: Secondary | ICD-10-CM

## 2019-11-22 DIAGNOSIS — M545 Low back pain: Secondary | ICD-10-CM | POA: Diagnosis present

## 2019-11-22 DIAGNOSIS — M4807 Spinal stenosis, lumbosacral region: Secondary | ICD-10-CM | POA: Diagnosis present

## 2019-11-22 DIAGNOSIS — G8929 Other chronic pain: Secondary | ICD-10-CM

## 2019-11-22 DIAGNOSIS — M79604 Pain in right leg: Secondary | ICD-10-CM

## 2019-11-22 DIAGNOSIS — M79605 Pain in left leg: Secondary | ICD-10-CM | POA: Insufficient documentation

## 2019-11-22 DIAGNOSIS — Z992 Dependence on renal dialysis: Secondary | ICD-10-CM | POA: Diagnosis present

## 2019-11-22 MED ORDER — LIDOCAINE HCL 2 % IJ SOLN
20.0000 mL | Freq: Once | INTRAMUSCULAR | Status: AC
  Administered 2019-11-22: 400 mg
  Filled 2019-11-22: qty 20

## 2019-11-22 MED ORDER — SODIUM CHLORIDE 0.9% FLUSH
2.0000 mL | Freq: Once | INTRAVENOUS | Status: AC
  Administered 2019-11-22: 2 mL

## 2019-11-22 MED ORDER — TRIAMCINOLONE ACETONIDE 40 MG/ML IJ SUSP
40.0000 mg | Freq: Once | INTRAMUSCULAR | Status: AC
  Administered 2019-11-22: 40 mg
  Filled 2019-11-22: qty 1

## 2019-11-22 MED ORDER — IOHEXOL 180 MG/ML  SOLN
INTRAMUSCULAR | Status: AC
  Filled 2019-11-22: qty 20

## 2019-11-22 MED ORDER — SODIUM CHLORIDE (PF) 0.9 % IJ SOLN
INTRAMUSCULAR | Status: AC
  Filled 2019-11-22: qty 10

## 2019-11-22 MED ORDER — LACTATED RINGERS IV SOLN: 1000.0000 mL | Freq: Once | INTRAVENOUS | Status: DC

## 2019-11-22 MED ORDER — ROPIVACAINE HCL 2 MG/ML IJ SOLN
2.0000 mL | Freq: Once | INTRAMUSCULAR | Status: AC
  Administered 2019-11-22: 2 mL via EPIDURAL
  Filled 2019-11-22: qty 10

## 2019-11-22 MED ORDER — MIDAZOLAM HCL 5 MG/5ML IJ SOLN
1.0000 mg | INTRAMUSCULAR | Status: DC | PRN
  Filled 2019-11-22: qty 5

## 2019-11-22 MED ORDER — FENTANYL CITRATE (PF) 100 MCG/2ML IJ SOLN
25.0000 ug | INTRAMUSCULAR | Status: DC | PRN
  Filled 2019-11-22: qty 2

## 2019-11-22 NOTE — Progress Notes (Signed)
PROVIDER NOTE: Information contained herein reflects review and annotations entered in association with encounter. Interpretation of such information and data should be left to medically-trained personnel. Information provided to patient can be located elsewhere in the medical record under "Patient Instructions". Document created using STT-dictation technology, any transcriptional errors that may result from process are unintentional.    Patient: Steve Vance  Service Category: Procedure  Provider: Gaspar Cola, MD  DOB: 28-Sep-1960  DOS: 11/22/2019  Location: [facility]  MRN: [id number]  Setting: Ambulatory - outpatient  Referring Provider: [facility]*  Type: Established Patient  Specialty: Interventional Pain Management  PCP: [place]   Primary Reason for Visit: Interventional Pain Management Treatment. CC: Back Pain (low)  Procedure:          Anesthesia, Analgesia, Anxiolysis:  Type: Therapeutic Inter-Laminar Epidural Steroid Injection  #1  Region: Lumbar Level: L2-3 Level. Laterality: Midline Paramedial  Type: Local Anesthesia Indication(s): Analgesia         Route: Infiltration ([place]/IM) IV Access: Unable to obtain IV access despite multiple trials by multiple nurses. Sedation: Declined  Local Anesthetic: Lidocaine 1-2%  Position: Prone with head of the table was raised to facilitate breathing.   Indications: 1. Chronic low back pain (1ry area of Pain) (Bilateral) (R=L)   2. DDD (degenerative disc disease), lumbosacral   3. 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 Chronic lower extremity pain (3ry area of Pain) (Bilateral) (R>L)   6. Abnormal MRI, lumbar spine (05/25/2019)   7. ESRD on dialysis [facility])    Pain Score: Pre-procedure: 7 /10 Post-procedure: 5 /10   Pre-op Assessment:  Steve Vance is a 59 y.o. (year old), male patient,  seen today for interventional treatment. He  has a past surgical history that includes Left Shoulder Surgery; DIALYSIS/PERMA CATHETER INSERTION (N/A, 04/02/2019); AV fistula placement (Left, 06/27/2019); and DIALYSIS/PERMA CATHETER REMOVAL (N/A, 10/03/2019). Steve Vance has a current medication list which includes the following prescription(s): accu-chek fastclix lancets, amlodipine, aspirin, atorvastatin, blood glucose meter kit and supplies, calcium acetate, cyclobenzaprine, hydrocodone-acetaminophen, labetalol, losartan, and sertraline, and the following Facility-Administered Medications: fentanyl, lactated ringers, and midazolam. His primarily concern today is the Back Pain (low)  Initial Vital Signs:  Pulse/HCG Rate: 90  Temp: 98.2 F (36.8 C) Resp: 18 BP: (!) 171/82 SpO2: 100 %  BMI: Estimated body mass index is 33.47 kg/m as calculated from the following:   Height as of this encounter: '5\' 11"'  (1.803 m).   Weight as of this encounter: 240 lb (108.9 kg).  Risk Assessment: Allergies: Reviewed. He is allergic to baclofen; gabapentin; and percocet [oxycodone-acetaminophen].  Allergy Precautions: None required Coagulopathies: Reviewed. None identified.  Blood-thinner therapy: None at this time Active Infection(s): Reviewed. None identified. Steve Vance is afebrile  Site Confirmation: Steve Vance was asked to confirm the procedure and laterality before marking the site Procedure checklist: Completed Consent: Before the procedure and under the influence of no sedative(s), amnesic(s), or anxiolytics, the patient was informed of the treatment options, risks and possible complications. To fulfill our ethical and legal obligations, as recommended by the American Medical Association's Code of Ethics, I have informed the patient of my clinical impression; the nature and purpose of the treatment or procedure; the risks, benefits, and possible complications of the intervention; the alternatives, including  doing nothing; the risk(s) and benefit(s) of the alternative treatment(s) or procedure(s); and the risk(s) and  benefit(s) of doing nothing. The patient was provided information about the general risks and possible complications associated with the procedure. These may include, but are not limited to: failure to achieve desired goals, infection, bleeding, organ or nerve damage, allergic reactions, paralysis, and death. In addition, the patient was informed of those risks and complications associated to Spine-related procedures, such as failure to decrease pain; infection (i.e.: Meningitis, epidural or intraspinal abscess); bleeding (i.e.: epidural hematoma, subarachnoid hemorrhage, or any other type of intraspinal or peri-dural bleeding); organ or nerve damage (i.e.: Any type of peripheral nerve, nerve root, or spinal cord injury) with subsequent damage to sensory, motor, and/or autonomic systems, resulting in permanent pain, numbness, and/or weakness of one or several areas of the body; allergic reactions; (i.e.: anaphylactic reaction); and/or death. Furthermore, the patient was informed of those risks and complications associated with the medications. These include, but are not limited to: allergic reactions (i.e.: anaphylactic or anaphylactoid reaction(s)); adrenal axis suppression; blood sugar elevation that in diabetics may result in ketoacidosis or comma; water retention that in patients with history of congestive heart failure may result in shortness of breath, pulmonary edema, and decompensation with resultant heart failure; weight gain; swelling or edema; medication-induced neural toxicity; particulate matter embolism and blood vessel occlusion with resultant organ, and/or nervous system infarction; and/or aseptic necrosis of one or more joints. Finally, the patient was informed that Medicine is not an exact science; therefore, there is also the possibility of unforeseen or unpredictable risks and/or  possible complications that may result in a catastrophic outcome. The patient indicated having understood very clearly. We have given the patient no guarantees and we have made no promises. Enough time was given to the patient to ask questions, all of which were answered to the patient's satisfaction. Steve Vance has indicated that he wanted to continue with the procedure. Attestation: I, the ordering provider, attest that I have discussed with the patient the benefits, risks, side-effects, alternatives, likelihood of achieving goals, and potential problems during recovery for the procedure that I have provided informed consent. Date  Time: 11/22/2019  9:35 AM  Pre-Procedure Preparation:  Monitoring: As per clinic protocol. Respiration, ETCO2, SpO2, BP, heart rate and rhythm monitor placed and checked for adequate function Safety Precautions: Patient was assessed for positional comfort and pressure points before starting the procedure. Time-out: I initiated and conducted the "Time-out" before starting the procedure, as per protocol. The patient was asked to participate by confirming the accuracy of the "Time Out" information. Verification of the correct person, site, and procedure were performed and confirmed by me, the nursing staff, and the patient. "Time-out" conducted as per Joint Commission's Universal Protocol (UP.01.01.01). Time: 1045  Description of Procedure:          Target Area: The interlaminar space, initially targeting the lower laminar border of the superior vertebral body. Approach: Paramedial approach. Area Prepped: Entire Posterior Lumbar Region Prepping solution: DuraPrep (Iodine Povacrylex [0.7% available iodine] and Isopropyl Alcohol, 74% w/w) Safety Precautions: Aspiration looking for blood return was conducted prior to all injections. At no point did we inject any substances, as a needle was being advanced. No attempts were made at seeking any paresthesias. Safe injection  practices and needle disposal techniques used. Medications properly checked for expiration dates. SDV (single dose vial) medications used. Description of the Procedure: Protocol guidelines were followed. The procedure needle was introduced through the skin, ipsilateral to the reported pain, and advanced to the target area. Bone was contacted and the needle walked  caudad, until the lamina was cleared. The epidural space was identified using "loss-of-resistance technique" with 2-3 ml of PF-NaCl (0.9% NSS), in a 5cc LOR glass syringe.  Vitals:   11/22/19 1040 11/22/19 1045 11/22/19 1050 11/22/19 1055  BP: (!) 129/92 121/81 134/76 133/77  Pulse: 70 77 79 80  Resp: 16 (!) 21 (!) 28 (!) 22  Temp:      SpO2: 100% 100% 100% 99%  Weight:      Height:        Start Time: 1045 hrs. End Time: 1054 hrs.  Materials:  Needle(s) Type: Epidural needle Gauge: 17G Length: 3.5-in Medication(s): Please see orders for medications and dosing details.  Imaging Guidance (Spinal):          Type of Imaging Technique: Fluoroscopy Guidance (Spinal) Indication(s): Assistance in needle guidance and placement for procedures requiring needle placement in or near specific anatomical locations not easily accessible without such assistance. Exposure Time: Please see nurses notes. Contrast: Before injecting any contrast, we confirmed that the patient did not have an allergy to iodine, shellfish, or radiological contrast. Once satisfactory needle placement was completed at the desired level, radiological contrast was injected. Contrast injected under live fluoroscopy. No contrast complications. See chart for type and volume of contrast used. Fluoroscopic Guidance: I was personally present during the use of fluoroscopy. "Tunnel Vision Technique" used to obtain the best possible view of the target area. Parallax error corrected before commencing the procedure. "Direction-depth-direction" technique used to introduce the needle  under continuous pulsed fluoroscopy. Once target was reached, antero-posterior, oblique, and lateral fluoroscopic projection used confirm needle placement in all planes. Images permanently stored in EMR. Interpretation: I personally interpreted the imaging intraoperatively. Adequate needle placement confirmed in multiple planes. Appropriate spread of contrast into desired area was observed. No evidence of afferent or efferent intravascular uptake. No intrathecal or subarachnoid spread observed. Permanent images saved into the patient's record.  Antibiotic Prophylaxis:   Anti-infectives (From admission, onward)   None     Indication(s): None identified  Post-operative Assessment:  Post-procedure Vital Signs:  Pulse/HCG Rate: 80  Temp: 98.2 F (36.8 C) Resp: (!) 22 BP: 133/77 SpO2: 99 %  EBL: None  Complications: No immediate post-treatment complications observed by team, or reported by patient.  Note: The patient tolerated the entire procedure well. A repeat set of vitals were taken after the procedure and the patient was kept under observation following institutional policy, for this type of procedure. Post-procedural neurological assessment was performed, showing return to baseline, prior to discharge. The patient was provided with post-procedure discharge instructions, including a section on how to identify potential problems. Should any problems arise concerning this procedure, the patient was given instructions to immediately contact us, at any time, without hesitation. In any case, we plan to contact the patient by telephone for a follow-up status report regarding this interventional procedure.  Comments:  No additional relevant information.  Plan of Care  Orders:  Orders Placed This Encounter  Procedures  . Lumbar Epidural Injection    Scheduling Instructions:     Procedure: Interlaminar LESI L2-3     Laterality: Midline     Sedation: No Sedation     Timeframe:  Today     Order Specific Question:   Where will this procedure be performed?    Answer:   [facility]  . [facility] C-ARM 1-60 MIN NO REPORT    Intraoperative interpretation by procedural physician at [place].    Standing  Status:   Standing    Number of Occurrences:   1    Order Specific Question:   Reason for exam:    Answer:   Assistance in needle guidance and placement for procedures requiring needle placement in or near specific anatomical locations not easily accessible without such assistance.  . Informed Consent Details: Physician/Practitioner Attestation; Transcribe to consent form and obtain patient signature    Provider Attestation: I, [place] Dossie Arbour, MD, (Pain Management Specialist), the physician/practitioner, attest that I have discussed with the patient the benefits, risks, side effects, alternatives, likelihood of achieving goals and potential problems during recovery for the procedure that I have provided informed consent.    Scheduling Instructions:     Procedure: Lumbar epidural steroid injection under fluoroscopic guidance     Indications: Low back and/or lower extremity pain secondary to lumbar radiculitis     Note: Always confirm laterality of pain with Steve Vance, before procedure.     Transcribe to consent form and obtain patient signature.  . Provide equipment / supplies at bedside    Equipment required: Single use, disposable, "Epidural Tray" Epidural Catheter: NOT required    Standing Status:   Standing    Number of Occurrences:   1    Order Specific Question:   Specify    Answer:   Epidural Tray   Chronic Opioid Analgesic:  Hydrocodone/APAP 5/325, 1 tab PO BID to q4hrs Highest recorded MME/day: 35 mg/day MME/day: 10 mg/day   Medications ordered for procedure: Meds ordered this encounter  Medications  . lidocaine (XYLOCAINE) 2 % (with pres) injection 400 mg  . sodium chloride flush (NS) 0.9 % injection 2 mL  . ropivacaine (PF) 2 mg/mL  (0.2%) (NAROPIN) injection 2 mL  . triamcinolone acetonide (KENALOG-40) injection 40 mg  . lactated ringers infusion 1,000 mL  . midazolam (VERSED) 5 MG/5ML injection 1-2 mg    Make sure Flumazenil is available in the pyxis when using this medication. If oversedation occurs, administer 0.2 mg IV over 15 sec. If after 45 sec no response, administer 0.2 mg again over 1 min; may repeat at 1 min intervals; not to exceed 4 doses (1 mg)  . fentaNYL (SUBLIMAZE) injection 25-50 mcg    Make sure Narcan is available in the pyxis when using this medication. In the event of respiratory depression (RR< 8/min): Titrate NARCAN (naloxone) in increments of 0.1 to 0.2 mg IV at 2-3 minute intervals, until desired degree of reversal.   Medications administered: We administered lidocaine, sodium chloride flush, ropivacaine (PF) 2 mg/mL (0.2%), and triamcinolone acetonide.  See the medical record for exact dosing, route, and time of administration.  Follow-up plan:   Return in about 2 weeks (around 12/06/2019) for (VV), (PP).        Interventional treatment options: Planned, scheduled, and/or pending:      Under consideration:   Diagnostic midline L2-3 interlaminar LESI #1  Diagnostic bilateral L2 TFESI  Diagnostic bilateral lumbar facet block  Possible bilateral lumbar facet RFA  Diagnostic right-sided CESI  Diagnostic bilateral cervical facet block  Possible bilateral cervical facet RFA    Therapeutic/palliative (PRN):   None at this time    Recent Visits Date Type Provider Dept  11/14/19 Office Visit Milinda Pointer, MD [facility]  Showing recent visits within past 90 days and meeting all other requirements   Today's Visits Date Type Provider Dept  11/22/19 Procedure visit Milinda Pointer, MD [facility]  Showing today's visits and meeting all other  requirements   Future Appointments Date Type Provider Dept  12/05/19 Appointment Milinda Pointer, MD [facility]  Showing future appointments within next 90 days and meeting all other requirements   Disposition: Discharge home  Discharge (Date  Time): 11/22/2019; 1059 hrs.   Primary Care Physician: [place] Location: [facility] Note by: Gaspar Cola, MD Date: 11/22/2019; Time: 1:02 PM  Disclaimer:  Medicine is not an Chief Strategy Officer. The only guarantee in medicine is that nothing is guaranteed. It is important to note that the decision to proceed with this intervention was based on the information collected from the patient. The Data and conclusions were drawn from the patient's questionnaire, the interview, and the physical examination. Because the information was provided in large part by the patient, it cannot be guaranteed that it has not been purposely or unconsciously manipulated. Every effort has been made to obtain as much relevant data as possible for this evaluation. It is important to note that the conclusions that lead to this procedure are derived in large part from the available data. Always take into account that the treatment will also be dependent on availability of resources and existing treatment guidelines, considered by other Pain Management Practitioners as being common knowledge and practice, at the time of the intervention. For Medico-Legal purposes, it is also important to point out that variation in procedural techniques and pharmacological choices are the acceptable norm. The indications, contraindications, technique, and results of the above procedure should only be interpreted and judged by a Board-Certified Interventional Pain Specialist with extensive familiarity and expertise in the same exact procedure and technique.

## 2019-11-22 NOTE — Patient Instructions (Addendum)
____________________________________________________________________________________________  Post-Procedure Discharge Instructions  Instructions:  Apply ice:   Purpose: This will minimize any swelling and discomfort after procedure.   When: Day of procedure, as soon as you get home.  How: Fill a plastic sandwich bag with crushed ice. Cover it with a small towel and apply to injection site.  How long: (15 min on, 15 min off) Apply for 15 minutes then remove x 15 minutes.  Repeat sequence on day of procedure, until you go to bed.  Apply heat:   Purpose: To treat any soreness and discomfort from the procedure.  When: Starting the next day after the procedure.  How: Apply heat to procedure site starting the day following the procedure.  How long: May continue to repeat daily, until discomfort goes away.  Food intake: Start with clear liquids (like water) and advance to regular food, as tolerated.   Physical activities: Keep activities to a minimum for the first 8 hours after the procedure. After that, then as tolerated.  Driving: If you have received any sedation, be responsible and do not drive. You are not allowed to drive for 24 hours after having sedation.  Blood thinner: (Applies only to those taking blood thinners) You may restart your blood thinner 6 hours after your procedure.  Insulin: (Applies only to Diabetic patients taking insulin) As soon as you can eat, you may resume your normal dosing schedule.  Infection prevention: Keep procedure site clean and dry. Shower daily and clean area with soap and water.  Post-procedure Pain Diary: Extremely important that this be done correctly and accurately. Recorded information will be used to determine the next step in treatment. For the purpose of accuracy, follow these rules:  Evaluate only the area treated. Do not report or include pain from an untreated area. For the purpose of this evaluation, ignore all other areas of pain,  except for the treated area.  After your procedure, avoid taking a long nap and attempting to complete the pain diary after you wake up. Instead, set your alarm clock to go off every hour, on the hour, for the initial 8 hours after the procedure. Document the duration of the numbing medicine, and the relief you are getting from it.  Do not go to sleep and attempt to complete it later. It will not be accurate. If you received sedation, it is likely that you were given a medication that may cause amnesia. Because of this, completing the diary at a later time may cause the information to be inaccurate. This information is needed to plan your care.  Follow-up appointment: Keep your post-procedure follow-up evaluation appointment after the procedure (usually 2 weeks for most procedures, 6 weeks for radiofrequencies). DO NOT FORGET to bring you pain diary with you.   Expect: (What should I expect to see with my procedure?)  From numbing medicine (AKA: Local Anesthetics): Numbness or decrease in pain. You may also experience some weakness, which if present, could last for the duration of the local anesthetic.  Onset: Full effect within 15 minutes of injected.  Duration: It will depend on the type of local anesthetic used. On the average, 1 to 8 hours.   From steroids (Applies only if steroids were used): Decrease in swelling or inflammation. Once inflammation is improved, relief of the pain will follow.  Onset of benefits: Depends on the amount of swelling present. The more swelling, the longer it will take for the benefits to be seen. In some cases, up to 10 days.  Duration: Steroids will stay in the system x 2 weeks. Duration of benefits will depend on multiple posibilities including persistent irritating factors.  Side-effects: If present, they may typically last 2 weeks (the duration of the steroids).  Frequent: Cramps (if they occur, drink Gatorade and take over-the-counter Magnesium 450-500 mg  once to twice a day); water retention with temporary weight gain; increases in blood sugar; decreased immune system response; increased appetite.  Occasional: Facial flushing (red, warm cheeks); mood swings; menstrual changes.  Uncommon: Long-term decrease or suppression of natural hormones; bone thinning. (These are more common with higher doses or more frequent use. This is why we prefer that our patients avoid having any injection therapies in other practices.)   Very Rare: Severe mood changes; psychosis; aseptic necrosis.  From procedure: Some discomfort is to be expected once the numbing medicine wears off. This should be minimal if ice and heat are applied as instructed.  Call if: (When should I call?)  You experience numbness and weakness that gets worse with time, as opposed to wearing off.  New onset bowel or bladder incontinence. (Applies only to procedures done in the spine)  Emergency Numbers:  Durning business hours (Monday - Thursday, 8:00 AM - 4:00 PM) (Friday, 9:00 AM - 12:00 Noon): [phone]  After hours: [phone]  NOTE: If you are having a problem and are unable connect with, or to talk to a provider, then go to your nearest urgent care or emergency department. If the problem is serious and urgent, please call 911. ____________________________________________________________________________________________   Celiac Plexus Block Patient Information  Description: The celiac plexus is a group of nerves which are part of the sympathetic nervous system.  These nerves supply organs in the abdomen and pelvis.  Specific organs supplied with sensation by the celiac plexus include the stomach, liver, gallbladder, pancreas, kidneys and part of the gut.   The celiac plexus is located on both sides of the aorta at approximately the level of the first lumbar vertebral body.  The block will be performed with you lying on your abdomen with a pillow underneath.  Using direct  x-ray guidance, the celiac plexus will be located on both sides of the spine.  Numbing medicine will be used to deaden the skin prior to needle insertion.  In most cases, a small amount of sedation can be given by IV prior to the numbing medicine.  Two small needles will be place near the celiac plexus and local anesthetic and steroid will be injected.  The entire block usually last about 15-25 minutes.  Conditions which may be treated by celiac plexus block:   Acute and chronic pancreatitis  Pain from liver or pancreatic cancer  Pain from Crohn's disease  Other types of abdominal or flank pain  Preparation for the injection:  1. Do not eat any solid food or dairy products within 8 hours of your appointment. 2. You may drink clear liquids up to 3 hours before appointment.  Clear liquids include water, black coffee, juice or soda.  No milk or cream please. 3. You may take your regular medication, including pain medications, with a sip of water before your appointment.  Diabetics should hold regular insulin (if taken separately) and take 1/2 normal NPH dose in the morning of the procedure.  Carry some sugar containing items with you to your appointment. 4. A driver must accompany you and be prepared to drive you home after your procedure. 5. Bring all your current medications with you.  6. An IV may be inserted and sedation may be given at the discretion of the physician. 7. A blood pressure cuff, EKG, and other monitors will often be applied during the procedure.  Some patients may need to have extra oxygen administered for a short period. 8. You will be asked to provide medical information, including your allergies and medications, prior to the procedure.  We must know immediately if you are taking blood thinners (like Coumadin/Warfarin) or if you are allergic to IV iodine contrast (dye).  We must know if you could possible be pregnant.  Possible side-effects:   Bleeding from needle site or  deeper  Infection (rarre, can require surgery)  Nerve injury (rare)  Numbness & tingling (temporary)  Collapsed lung (rare)  Spinal headache ( a headache worse with upright posture)  Light-headedness (temporary)  Pain at injection site (several days)  Decreased blood pressure (temporary)  Weakness in legs (temporary)  Seizure or other drug reaction (rare)  Call if you experience:   Fever/chills associated with headache or increased back/neck pain  Headache worsened by an upright position  New onset weakness or numbness of an extremity below the injection site.  Hives or difficulty breathing (go to the emergency room)  Inflammation or drainage at the injection site.  New onset diarrhea lasting more than 2 weeks.  New symptoms which are concerning to you  Please note:  If effective, we will often do a series of 2-3 injections spaced 3-6 weeks apart to maximally decrease your pain.  If initial series is effective, you may be a candidate for a more permanent block of the celiac plexus. .  If you have questions, please call [phone] [facility]

## 2019-11-22 NOTE — Progress Notes (Signed)
Safety precautions to be maintained throughout the outpatient stay will include: orient to surroundings, keep bed in low position, maintain call bell within reach at all times, provide assistance with transfer out of bed and ambulation.  

## 2019-11-23 ENCOUNTER — Telehealth: Payer: Self-pay

## 2019-11-23 NOTE — Telephone Encounter (Signed)
Post procedure phone call.  Voicemail box has not been set up.  Unable to leave message.

## 2019-12-04 ENCOUNTER — Encounter: Payer: Self-pay | Admitting: Pain Medicine

## 2019-12-04 DIAGNOSIS — N179 Acute kidney failure, unspecified: Secondary | ICD-10-CM | POA: Insufficient documentation

## 2019-12-04 NOTE — Progress Notes (Signed)
Pain relief after procedure (treated area only): (Questions asked to patient) 1. Starting about 15 minutes after the procedure, and "while the area was still numb" (from the local anesthetics), were you having any of your usual pain "in that area" (the treated area)?  (NOTE: NOT including the discomfort from the needle sticks.) First 1 hour: 20 % better. First 4-6 hours:20% better. 2. How long did the numbness from the local anesthetics last? (More than 6 hours?) Duration: 0 hours.  3. How much better is your pain now, when compared to before the procedure? Current benefit: 75 % better. 4. Can you move better now? Improvement in ROM (Range of Motion): Yes. 5. Can you do more now? Improvement in function: No. 4. Did you have any problems with the procedure? Side-effects/Complications: No.
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
## 2019-12-05 ENCOUNTER — Other Ambulatory Visit: Payer: Self-pay

## 2019-12-05 ENCOUNTER — Ambulatory Visit: Payer: Medicare Other | Attending: Pain Medicine | Admitting: Pain Medicine

## 2019-12-05 DIAGNOSIS — M5137 Other intervertebral disc degeneration, lumbosacral region: Secondary | ICD-10-CM

## 2019-12-05 DIAGNOSIS — M4807 Spinal stenosis, lumbosacral region: Secondary | ICD-10-CM | POA: Diagnosis not present

## 2019-12-05 DIAGNOSIS — M545 Low back pain: Secondary | ICD-10-CM | POA: Diagnosis not present

## 2019-12-05 DIAGNOSIS — M48062 Spinal stenosis, lumbar region with neurogenic claudication: Secondary | ICD-10-CM | POA: Diagnosis not present

## 2019-12-05 DIAGNOSIS — G8929 Other chronic pain: Secondary | ICD-10-CM

## 2019-12-05 DIAGNOSIS — M47816 Spondylosis without myelopathy or radiculopathy, lumbar region: Secondary | ICD-10-CM

## 2019-12-05 NOTE — Patient Instructions (Signed)
____________________________________________________________________________________________  Preparing for Procedure with Sedation  Procedure appointments are limited to planned procedures: . No Prescription Refills. . No disability issues will be discussed. . No medication changes will be discussed.  Instructions: . Oral Intake: Do not eat or drink anything for at least 3 hours prior to your procedure. (Exception: Blood Pressure Medication. See below.) . Transportation: Unless otherwise stated by your physician, you may drive yourself after the procedure. . Blood Pressure Medicine: Do not forget to take your blood pressure medicine with a sip of water the morning of the procedure. If your Diastolic (lower reading)is above 100 mmHg, elective cases will be cancelled/rescheduled. . Blood thinners: These will need to be stopped for procedures. Notify our staff if you are taking any blood thinners. Depending on which one you take, there will be specific instructions on how and when to stop it. . Diabetics on insulin: Notify the staff so that you can be scheduled 1st case in the morning. If your diabetes requires high dose insulin, take only  of your normal insulin dose the morning of the procedure and notify the staff that you have done so. . Preventing infections: Shower with an antibacterial soap the morning of your procedure. . Build-up your immune system: Take 1000 mg of Vitamin C with every meal (3 times a day) the day prior to your procedure. . Antibiotics: Inform the staff if you have a condition or reason that requires you to take antibiotics before dental procedures. . Pregnancy: If you are pregnant, call and cancel the procedure. . Sickness: If you have a cold, fever, or any active infections, call and cancel the procedure. . Arrival: You must be in the facility at least 30 minutes prior to your scheduled procedure. . Children: Do not bring children with you. . Dress appropriately:  Bring dark clothing that you would not mind if they get stained. . Valuables: Do not bring any jewelry or valuables.  Reasons to call and reschedule or cancel your procedure: (Following these recommendations will minimize the risk of a serious complication.) . Surgeries: Avoid having procedures within 2 weeks of any surgery. (Avoid for 2 weeks before or after any surgery). . Flu Shots: Avoid having procedures within 2 weeks of a flu shots or . (Avoid for 2 weeks before or after immunizations). . Barium: Avoid having a procedure within 7-10 days after having had a radiological study involving the use of radiological contrast. (Myelograms, Barium swallow or enema study). . Heart attacks: Avoid any elective procedures or surgeries for the initial 6 months after a "Myocardial Infarction" (Heart Attack). . Blood thinners: It is imperative that you stop these medications before procedures. Let us know if you if you take any blood thinner.  . Infection: Avoid procedures during or within two weeks of an infection (including chest colds or gastrointestinal problems). Symptoms associated with infections include: Localized redness, fever, chills, night sweats or profuse sweating, burning sensation when voiding, cough, congestion, stuffiness, runny nose, sore throat, diarrhea, nausea, vomiting, cold or Flu symptoms, recent or current infections. It is specially important if the infection is over the area that we intend to treat. . Heart and lung problems: Symptoms that may suggest an active cardiopulmonary problem include: cough, chest pain, breathing difficulties or shortness of breath, dizziness, ankle swelling, uncontrolled high or unusually low blood pressure, and/or palpitations. If you are experiencing any of these symptoms, cancel your procedure and contact your primary care physician for an evaluation.  Remember:  Regular Business hours are:    Monday to Thursday 8:00 AM to 4:00 PM  Provider's  Schedule: Deidrick Rainey, MD:  Procedure days: Tuesday and Thursday 7:30 AM to 4:00 PM  Bilal Lateef, MD:  Procedure days: Monday and Wednesday 7:30 AM to 4:00 PM ____________________________________________________________________________________________    

## 2019-12-10 NOTE — Patient Instructions (Signed)

## 2019-12-10 NOTE — Progress Notes (Signed)
PROVIDER NOTE: Information contained herein reflects review and annotations entered in association with encounter. Interpretation of such information and data should be left to medically-trained personnel. Information provided to patient can be located elsewhere in the medical record under "Patient Instructions". Document created using STT-dictation technology, any transcriptional errors that may result from process are unintentional.    Patient: Steve Vance  Service Category: Procedure  Provider: Gaspar Cola, MD  DOB: [birth year]  DOS: 12/11/2019  Location: [facility]  MRN: [id number]  Setting: Ambulatory - outpatient  Referring Provider: [facility]*  Type: Established Patient  Specialty: Interventional Pain Management  PCP: [place]   Primary Reason for Visit: Interventional Pain Management Treatment. CC: Back Pain (lower)  Procedure:          Anesthesia, Analgesia, Anxiolysis:  Type: Lumbar Facet, Medial Branch Block(s) #1  Primary Purpose: Diagnostic Region: Posterolateral Lumbosacral Spine Level: L2, L3, L4, L5, & S1 Medial Branch Level(s). Injecting these levels blocks the L3-4, L4-5, and L5-S1 lumbar facet joints. Laterality: Bilateral  Type: Moderate (Conscious) Sedation combined with Local Anesthesia Indication(s): Analgesia and Anxiety Route: Intravenous (IV) IV Access: Secured Sedation: Meaningful verbal contact was maintained at all times during the procedure  Local Anesthetic: Lidocaine 1-2%  Position: Prone   Indications: 1. Lumbar facet syndrome (Bilateral)   2. 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 Lumbar facet arthropathy (Multilevel) (Bilateral)   4. DDD (degenerative disc disease), lumbosacral   5. Chronic low back pain (1ry area of Pain) (Bilateral) (R=L)    Pain Score: Pre-procedure: 7 /10 Post-procedure: 0-No pain/10   Pre-op Assessment:  Steve Vance is a 59  y.o. (year old), male patient, seen today for interventional treatment. He  has a past surgical history that includes Left Shoulder Surgery; DIALYSIS/PERMA CATHETER INSERTION (N/A, 04/02/2019); AV fistula placement (Left, 06/27/2019); and DIALYSIS/PERMA CATHETER REMOVAL (N/A, 10/03/2019). Steve Vance has a current medication list which includes the following prescription(s): accu-chek fastclix lancets, amlodipine, aspirin, atorvastatin, blood glucose meter kit and supplies, calcium acetate, cyclobenzaprine, hydrocodone-acetaminophen, labetalol, losartan, and sertraline, and the following Facility-Administered Medications: midazolam. His primarily concern today is the Back Pain (lower)  Initial Vital Signs:  Pulse/HCG Rate: 79ECG Heart Rate: 72 Temp: 97.7 F (36.5 C) Resp: 18 BP: (!) 176/86 SpO2: 100 %  BMI: Estimated body mass index is 33.47 kg/m as calculated from the following:   Height as of this encounter: '5\' 11"'  (1.803 m).   Weight as of this encounter: 240 lb (108.9 kg).  Risk Assessment: Allergies: Reviewed. He is allergic to baclofen; gabapentin; and percocet [oxycodone-acetaminophen].  Allergy Precautions: None required Coagulopathies: Reviewed. None identified.  Blood-thinner therapy: None at this time Active Infection(s): Reviewed. None identified. Steve Vance is afebrile  Site Confirmation: Steve Vance was asked to confirm the procedure and laterality before marking the site Procedure checklist: Completed Consent: Before the procedure and under the influence of no sedative(s), amnesic(s), or anxiolytics, the patient was informed of the treatment options, risks and possible complications. To fulfill our ethical and legal obligations, as recommended by the American Medical Association's Code of Ethics, I have informed the patient of my clinical impression; the nature and purpose of the treatment or procedure; the risks, benefits, and possible complications of the intervention; the  alternatives, including doing nothing; the risk(s) and benefit(s) of the alternative treatment(s) or procedure(s); and the risk(s) and benefit(s) of doing nothing. The patient was provided information about the general risks and possible  complications associated with the procedure. These may include, but are not limited to: failure to achieve desired goals, infection, bleeding, organ or nerve damage, allergic reactions, paralysis, and death. In addition, the patient was informed of those risks and complications associated to Spine-related procedures, such as failure to decrease pain; infection (i.e.: Meningitis, epidural or intraspinal abscess); bleeding (i.e.: epidural hematoma, subarachnoid hemorrhage, or any other type of intraspinal or peri-dural bleeding); organ or nerve damage (i.e.: Any type of peripheral nerve, nerve root, or spinal cord injury) with subsequent damage to sensory, motor, and/or autonomic systems, resulting in permanent pain, numbness, and/or weakness of one or several areas of the body; allergic reactions; (i.e.: anaphylactic reaction); and/or death. Furthermore, the patient was informed of those risks and complications associated with the medications. These include, but are not limited to: allergic reactions (i.e.: anaphylactic or anaphylactoid reaction(s)); adrenal axis suppression; blood sugar elevation that in diabetics may result in ketoacidosis or comma; water retention that in patients with history of congestive heart failure may result in shortness of breath, pulmonary edema, and decompensation with resultant heart failure; weight gain; swelling or edema; medication-induced neural toxicity; particulate matter embolism and blood vessel occlusion with resultant organ, and/or nervous system infarction; and/or aseptic necrosis of one or more joints. Finally, the patient was informed that Medicine is not an exact science; therefore, there is also the possibility of unforeseen or  unpredictable risks and/or possible complications that may result in a catastrophic outcome. The patient indicated having understood very clearly. We have given the patient no guarantees and we have made no promises. Enough time was given to the patient to ask questions, all of which were answered to the patient's satisfaction. Mr. Hymas has indicated that he wanted to continue with the procedure. Attestation: I, the ordering provider, attest that I have discussed with the patient the benefits, risks, side-effects, alternatives, likelihood of achieving goals, and potential problems during recovery for the procedure that I have provided informed consent. Date   Time: 12/11/2019  7:58 AM  Pre-Procedure Preparation:  Monitoring: As per clinic protocol. Respiration, ETCO2, SpO2, BP, heart rate and rhythm monitor placed and checked for adequate function Safety Precautions: Patient was assessed for positional comfort and pressure points before starting the procedure. Time-out: I initiated and conducted the "Time-out" before starting the procedure, as per protocol. The patient was asked to participate by confirming the accuracy of the "Time Out" information. Verification of the correct person, site, and procedure were performed and confirmed by me, the nursing staff, and the patient. "Time-out" conducted as per Joint Commission's Universal Protocol (UP.01.01.01). Time: 0973  Description of Procedure:          Laterality: Bilateral. The procedure was performed in identical fashion on both sides. Levels:  L2, L3, L4, L5, & S1 Medial Branch Level(s) Area Prepped: Posterior Lumbosacral Region DuraPrep (Iodine Povacrylex [0.7% available iodine] and Isopropyl Alcohol, 74% w/w) Safety Precautions: Aspiration looking for blood return was conducted prior to all injections. At no point did we inject any substances, as a needle was being advanced. Before injecting, the patient was told to immediately notify me if he was  experiencing any new onset of "ringing in the ears, or metallic taste in the mouth". No attempts were made at seeking any paresthesias. Safe injection practices and needle disposal techniques used. Medications properly checked for expiration dates. SDV (single dose vial) medications used. After the completion of the procedure, all disposable equipment used was discarded in the proper designated medical waste containers.  Local Anesthesia: Protocol guidelines were followed. The patient was positioned over the fluoroscopy table. The area was prepped in the usual manner. The time-out was completed. The target area was identified using fluoroscopy. A 12-in long, straight, sterile hemostat was used with fluoroscopic guidance to locate the targets for each level blocked. Once located, the skin was marked with an approved surgical skin marker. Once all sites were marked, the skin (epidermis, dermis, and hypodermis), as well as deeper tissues (fat, connective tissue and muscle) were infiltrated with a small amount of a short-acting local anesthetic, loaded on a 10cc syringe with a 25G, 1.5-in  Needle. An appropriate amount of time was allowed for local anesthetics to take effect before proceeding to the next step. Local Anesthetic: Lidocaine 2.0% The unused portion of the local anesthetic was discarded in the proper designated containers. Technical explanation of process:  L2 Medial Branch Nerve Block (MBB): The target area for the L2 medial branch is at the junction of the postero-lateral aspect of the superior articular process and the superior, posterior, and medial edge of the transverse process of L3. Under fluoroscopic guidance, a Quincke needle was inserted until contact was made with os over the superior postero-lateral aspect of the pedicular shadow (target area). After negative aspiration for blood, 0.5 mL of the nerve block solution was injected without difficulty or complication. The needle was removed  intact. L3 Medial Branch Nerve Block (MBB): The target area for the L3 medial branch is at the junction of the postero-lateral aspect of the superior articular process and the superior, posterior, and medial edge of the transverse process of L4. Under fluoroscopic guidance, a Quincke needle was inserted until contact was made with os over the superior postero-lateral aspect of the pedicular shadow (target area). After negative aspiration for blood, 0.5 mL of the nerve block solution was injected without difficulty or complication. The needle was removed intact. L4 Medial Branch Nerve Block (MBB): The target area for the L4 medial branch is at the junction of the postero-lateral aspect of the superior articular process and the superior, posterior, and medial edge of the transverse process of L5. Under fluoroscopic guidance, a Quincke needle was inserted until contact was made with os over the superior postero-lateral aspect of the pedicular shadow (target area). After negative aspiration for blood, 0.5 mL of the nerve block solution was injected without difficulty or complication. The needle was removed intact. L5 Medial Branch Nerve Block (MBB): The target area for the L5 medial branch is at the junction of the postero-lateral aspect of the superior articular process and the superior, posterior, and medial edge of the sacral ala. Under fluoroscopic guidance, a Quincke needle was inserted until contact was made with os over the superior postero-lateral aspect of the pedicular shadow (target area). After negative aspiration for blood, 0.5 mL of the nerve block solution was injected without difficulty or complication. The needle was removed intact. S1 Medial Branch Nerve Block (MBB): The target area for the S1 medial branch is at the posterior and inferior 6 o'clock position of the L5-S1 facet joint. Under fluoroscopic guidance, the Quincke needle inserted for the L5 MBB was redirected until contact was made with  os over the inferior and postero aspect of the sacrum, at the 6 o' clock position under the L5-S1 facet joint (Target area). After negative aspiration for blood, 0.5 mL of the nerve block solution was injected without difficulty or complication. The needle was removed intact.  Nerve block solution: 0.2% PF-Ropivacaine +  Triamcinolone (40 mg/mL) diluted to a final concentration of 4 mg of Triamcinolone/mL of Ropivacaine The unused portion of the solution was discarded in the proper designated containers. Procedural Needles: 22-gauge, 7-inch, Quincke needles used for all levels.  Once the entire procedure was completed, the treated area was cleaned, making sure to leave some of the prepping solution back to take advantage of its long term bactericidal properties.   Illustration of the posterior view of the lumbar spine and the posterior neural structures. Laminae of L2 through S1 are labeled. DPRL5, dorsal primary ramus of L5; DPRS1, dorsal primary ramus of S1; DPR3, dorsal primary ramus of L3; FJ, facet (zygapophyseal) joint L3-L4; I, inferior articular process of L4; LB1, lateral branch of dorsal primary ramus of L1; IAB, inferior articular branches from L3 medial branch (supplies L4-L5 facet joint); IBP, intermediate branch plexus; MB3, medial branch of dorsal primary ramus of L3; NR3, third lumbar nerve root; S, superior articular process of L5; SAB, superior articular branches from L4 (supplies L4-5 facet joint also); TP3, transverse process of L3.  Vitals:   12/11/19 0859 12/11/19 0909 12/11/19 0919 12/11/19 0929  BP: (!) 205/73 (!) 191/81 (!) 187/83 (!) 190/82  Pulse: 76     Resp: '16 16 18 17  ' Temp:  97.6 F (36.4 C)  (!) 97.5 F (36.4 C)  TempSrc:      SpO2: 99% 99% 99% 99%  Weight:      Height:         Start Time: 0847 hrs. End Time: 0857 hrs.  Imaging Guidance (Spinal):          Type of Imaging Technique: Fluoroscopy Guidance (Spinal) Indication(s): Assistance in needle guidance  and placement for procedures requiring needle placement in or near specific anatomical locations not easily accessible without such assistance. Exposure Time: Please see nurses notes. Contrast: None used. Fluoroscopic Guidance: I was personally present during the use of fluoroscopy. "Tunnel Vision Technique" used to obtain the best possible view of the target area. Parallax error corrected before commencing the procedure. "Direction-depth-direction" technique used to introduce the needle under continuous pulsed fluoroscopy. Once target was reached, antero-posterior, oblique, and lateral fluoroscopic projection used confirm needle placement in all planes. Images permanently stored in EMR. Interpretation: No contrast injected. I personally interpreted the imaging intraoperatively. Adequate needle placement confirmed in multiple planes. Permanent images saved into the patient's record.  Antibiotic Prophylaxis:   Anti-infectives (From admission, onward)   None     Indication(s): None identified  Post-operative Assessment:  Post-procedure Vital Signs:  Pulse/HCG Rate: 7674 Temp: (!) 97.5 F (36.4 C) Resp: 17 BP: (!) 190/82 SpO2: 99 %  EBL: None  Complications: No immediate post-treatment complications observed by team, or reported by patient.  Note: The patient tolerated the entire procedure well. A repeat set of vitals were taken after the procedure and the patient was kept under observation following institutional policy, for this type of procedure. Post-procedural neurological assessment was performed, showing return to baseline, prior to discharge. The patient was provided with post-procedure discharge instructions, including a section on how to identify potential problems. Should any problems arise concerning this procedure, the patient was given instructions to immediately contact us, at any time, without hesitation. In any case, we plan to contact the patient by telephone for a follow-up  status report regarding this interventional procedure.  Comments:  No additional relevant information.  Plan of Care  Orders:  Orders Placed This Encounter  Procedures   LUMBAR FACET(MEDIAL BRANCH NERVE BLOCK)  MBNB    Scheduling Instructions:     Procedure: Lumbar facet block (AKA.: Lumbosacral medial branch nerve block)     Side: Bilateral     Level: L3-4, L4-5, & L5-S1 Facets (L2, L3, L4, L5, & S1 Medial Branch Nerves)     Sedation: Patient's choice.     Timeframe: Today    Order Specific Question:   Where will this procedure be performed?    Answer:   [facility]   DG [facility] C-ARM 1-60 MIN NO REPORT    Intraoperative interpretation by procedural physician at [place].    Standing Status:   Standing    Number of Occurrences:   1    Order Specific Question:   Reason for exam:    Answer:   Assistance in needle guidance and placement for procedures requiring needle placement in or near specific anatomical locations not easily accessible without such assistance.   Informed Consent Details: Physician/Practitioner Attestation; Transcribe to consent form and obtain patient signature    Nursing Order: Transcribe to consent form and obtain patient signature. Note: Always confirm laterality of pain with Mr. Court, before procedure. Procedure: Lumbar Facet Block  under fluoroscopic guidance Indication/Reason: Low Back Pain, with our without leg pain, due to Facet Joint Arthralgia (Joint Pain) known as Lumbar Facet Syndrome, secondary to Lumbar, and/or Lumbosacral Spondylosis (Arthritis of the Spine), without myelopathy or radiculopathy (Nerve Damage). Provider Attestation: I, [place] Dossie Arbour, MD, (Pain Management Specialist), the physician/practitioner, attest that I have discussed with the patient the benefits, risks, side effects, alternatives, likelihood of achieving goals and potential problems during recovery for the procedure that I have provided informed  consent.   Provide equipment / supplies at bedside    Equipment required: Single use, disposable, "Block Tray"    Standing Status:   Standing    Number of Occurrences:   1    Order Specific Question:   Specify    Answer:   Block Tray   Chronic Opioid Analgesic:  Hydrocodone/APAP 5/325, 1 tab PO BID to q4hrs Highest recorded MME/day: 35 mg/day MME/day: 10 mg/day   Medications ordered for procedure: Meds ordered this encounter  Medications   lidocaine (XYLOCAINE) 2 % (with pres) injection 400 mg   lactated ringers infusion 1,000 mL   midazolam (VERSED) 5 MG/5ML injection 1-2 mg    Make sure Flumazenil is available in the pyxis when using this medication. If oversedation occurs, administer 0.2 mg IV over 15 sec. If after 45 sec no response, administer 0.2 mg again over 1 min; may repeat at 1 min intervals; not to exceed 4 doses (1 mg)   fentaNYL (SUBLIMAZE) injection 25-50 mcg    Make sure Narcan is available in the pyxis when using this medication. In the event of respiratory depression (RR< 8/min): Titrate NARCAN (naloxone) in increments of 0.1 to 0.2 mg IV at 2-3 minute intervals, until desired degree of reversal.   ropivacaine (PF) 2 mg/mL (0.2%) (NAROPIN) injection 18 mL   triamcinolone acetonide (KENALOG-40) injection 80 mg   Medications administered: We administered lidocaine, lactated ringers, midazolam, fentaNYL, ropivacaine (PF) 2 mg/mL (0.2%), and triamcinolone acetonide.  See the medical record for exact dosing, route, and time of administration.  Follow-up plan:   Return in about 2 weeks (around 12/25/2019) for (VV), (PP).       Interventional treatment options: Planned, scheduled, and/or pending:      Under consideration:   Diagnostic midline L2-3 interlaminar LESI #1  Diagnostic bilateral  L2 TFESI  Diagnostic bilateral lumbar facet block  Possible bilateral lumbar facet RFA  Diagnostic right-sided CESI  Diagnostic bilateral cervical facet block  Possible  bilateral cervical facet RFA    Therapeutic/palliative (PRN):   None at this time      Recent Visits Date Type Provider Dept  12/05/19 [place], [place], MD [facility]  11/22/19 Procedure visit Milinda Pointer, MD [facility]  11/14/19 Office Visit Milinda Pointer, MD [facility]  Showing recent visits within past 90 days and meeting all other requirements   Today's Visits Date Type Provider Dept  12/11/19 Procedure visit Milinda Pointer, MD [facility]  Showing today's visits and meeting all other requirements   Future Appointments Date Type Provider Dept  01/01/20 Appointment Milinda Pointer, MD [facility]  Showing future appointments within next 90 days and meeting all other requirements   Disposition: Discharge home  Discharge (Date   Time): 12/11/2019; 0939 hrs.   Primary Care Physician: [place] Location: [facility] Note by: Gaspar Cola, MD Date: 12/11/2019; Time: 10:42 AM  Disclaimer:  Medicine is not an exact science. The only guarantee in medicine is that nothing is guaranteed. It is important to note that the decision to proceed with this intervention was based on the information collected from the patient. The Data and conclusions were drawn from the patient's questionnaire, the interview, and the physical examination. Because the information was provided in large part by the patient, it cannot be guaranteed that it has not been purposely or unconsciously manipulated. Every effort has been made to obtain as much relevant data as possible for this evaluation. It is important to note that the conclusions that lead to this procedure are derived in large part from the available data. Always take into account that the treatment will also be dependent on availability of resources and existing treatment guidelines, considered by other Pain Management  Practitioners as being common knowledge and practice, at the time of the intervention. For Medico-Legal purposes, it is also important to point out that variation in procedural techniques and pharmacological choices are the acceptable norm. The indications, contraindications, technique, and results of the above procedure should only be interpreted and judged by a Board-Certified Interventional Pain Specialist with extensive familiarity and expertise in the same exact procedure and technique.

## 2019-12-11 ENCOUNTER — Ambulatory Visit
Admission: RE | Admit: 2019-12-11 | Discharge: 2019-12-11 | Disposition: A | Discharge status: Home / Self Care | Payer: Medicare Other | Source: Ambulatory Visit | Attending: Pain Medicine | Admitting: Pain Medicine

## 2019-12-11 ENCOUNTER — Ambulatory Visit (HOSPITAL_BASED_OUTPATIENT_CLINIC_OR_DEPARTMENT_OTHER): Payer: Medicare Other | Admitting: Pain Medicine

## 2019-12-11 ENCOUNTER — Other Ambulatory Visit: Payer: Self-pay

## 2019-12-11 VITALS — BP 190/82 | HR 76 | Temp 97.5°F | Resp 17 | Ht 71.0 in | Wt 240.0 lb

## 2019-12-11 DIAGNOSIS — M545 Low back pain, unspecified: Secondary | ICD-10-CM

## 2019-12-11 DIAGNOSIS — M47816 Spondylosis without myelopathy or radiculopathy, lumbar region: Secondary | ICD-10-CM | POA: Diagnosis present

## 2019-12-11 DIAGNOSIS — G8929 Other chronic pain: Secondary | ICD-10-CM | POA: Insufficient documentation

## 2019-12-11 DIAGNOSIS — M5137 Other intervertebral disc degeneration, lumbosacral region: Secondary | ICD-10-CM | POA: Insufficient documentation

## 2019-12-11 DIAGNOSIS — M51379 Other intervertebral disc degeneration, lumbosacral region without mention of lumbar back pain or lower extremity pain: Secondary | ICD-10-CM

## 2019-12-11 MED ORDER — LACTATED RINGERS IV SOLN
1000.0000 mL | Freq: Once | INTRAVENOUS | Status: AC
  Administered 2019-12-11: 1000 mL via INTRAVENOUS

## 2019-12-11 MED ORDER — ROPIVACAINE HCL 2 MG/ML IJ SOLN
18.0000 mL | Freq: Once | INTRAMUSCULAR | Status: AC
  Administered 2019-12-11: 18 mL via PERINEURAL

## 2019-12-11 MED ORDER — TRIAMCINOLONE ACETONIDE 40 MG/ML IJ SUSP
80.0000 mg | Freq: Once | INTRAMUSCULAR | Status: AC
  Administered 2019-12-11: 80 mg

## 2019-12-11 MED ORDER — LIDOCAINE HCL 2 % IJ SOLN
INTRAMUSCULAR | Status: AC
  Filled 2019-12-11: qty 20

## 2019-12-11 MED ORDER — FENTANYL CITRATE (PF) 100 MCG/2ML IJ SOLN
25.0000 ug | INTRAMUSCULAR | Status: AC | PRN
  Administered 2019-12-11 (×2): 50 ug via INTRAVENOUS

## 2019-12-11 MED ORDER — TRIAMCINOLONE ACETONIDE 40 MG/ML IJ SUSP
INTRAMUSCULAR | Status: AC
  Filled 2019-12-11: qty 2

## 2019-12-11 MED ORDER — FENTANYL CITRATE (PF) 100 MCG/2ML IJ SOLN
INTRAMUSCULAR | Status: AC
  Filled 2019-12-11: qty 2

## 2019-12-11 MED ORDER — MIDAZOLAM HCL 5 MG/5ML IJ SOLN
1.0000 mg | INTRAMUSCULAR | Status: DC | PRN
  Administered 2019-12-11 (×2): 1 mg via INTRAVENOUS

## 2019-12-11 MED ORDER — ROPIVACAINE HCL 2 MG/ML IJ SOLN
INTRAMUSCULAR | Status: AC
  Filled 2019-12-11: qty 20

## 2019-12-11 MED ORDER — MIDAZOLAM HCL 5 MG/5ML IJ SOLN
INTRAMUSCULAR | Status: AC
  Filled 2019-12-11: qty 5

## 2019-12-11 MED ORDER — LIDOCAINE HCL 2 % IJ SOLN
20.0000 mL | Freq: Once | INTRAMUSCULAR | Status: AC
  Administered 2019-12-11: 400 mg

## 2019-12-11 NOTE — Progress Notes (Signed)
Safety precautions to be maintained throughout the outpatient stay will include: orient to surroundings, keep bed in low position, maintain call bell within reach at all times, provide assistance with transfer out of bed and ambulation.  

## 2019-12-12 ENCOUNTER — Telehealth: Payer: Self-pay | Admitting: *Deleted

## 2019-12-12 NOTE — Telephone Encounter (Signed)
Attempted to call for post procedure follow-up. Voice mailbox not set up.

## 2019-12-31 ENCOUNTER — Encounter: Payer: Self-pay | Admitting: Pain Medicine

## 2019-12-31 ENCOUNTER — Telehealth: Payer: Self-pay

## 2019-12-31 DIAGNOSIS — G894 Chronic pain syndrome: Secondary | ICD-10-CM | POA: Insufficient documentation
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
## 2019-12-31 NOTE — Telephone Encounter (Signed)
Attempted to call patient for virtual visit questions.  Voicemail has not been set up as of  Yet.  Unable to leave message.

## 2020-01-01 ENCOUNTER — Other Ambulatory Visit: Payer: Self-pay

## 2020-01-01 ENCOUNTER — Ambulatory Visit: Payer: Medicare Other | Attending: Pain Medicine | Admitting: Pain Medicine

## 2020-01-01 DIAGNOSIS — M545 Low back pain: Secondary | ICD-10-CM

## 2020-01-01 DIAGNOSIS — G894 Chronic pain syndrome: Secondary | ICD-10-CM | POA: Diagnosis not present

## 2020-01-01 DIAGNOSIS — M542 Cervicalgia: Secondary | ICD-10-CM | POA: Diagnosis not present

## 2020-01-01 DIAGNOSIS — G8929 Other chronic pain: Secondary | ICD-10-CM

## 2020-01-01 DIAGNOSIS — M79605 Pain in left leg: Secondary | ICD-10-CM

## 2020-01-01 DIAGNOSIS — M79604 Pain in right leg: Secondary | ICD-10-CM

## 2020-01-01 NOTE — Patient Instructions (Signed)
____________________________________________________________________________________________  Preparing for Procedure with Sedation  Procedure appointments are limited to planned procedures: . No Prescription Refills. . No disability issues will be discussed. . No medication changes will be discussed.  Instructions: . Oral Intake: Do not eat or drink anything for at least 3 hours prior to your procedure. (Exception: Blood Pressure Medication. See below.) . Transportation: Unless otherwise stated by your physician, you may drive yourself after the procedure. . Blood Pressure Medicine: Do not forget to take your blood pressure medicine with a sip of water the morning of the procedure. If your Diastolic (lower reading)is above 100 mmHg, elective cases will be cancelled/rescheduled. . Blood thinners: These will need to be stopped for procedures. Notify our staff if you are taking any blood thinners. Depending on which one you take, there will be specific instructions on how and when to stop it. . Diabetics on insulin: Notify the staff so that you can be scheduled 1st case in the morning. If your diabetes requires high dose insulin, take only  of your normal insulin dose the morning of the procedure and notify the staff that you have done so. . Preventing infections: Shower with an antibacterial soap the morning of your procedure. . Build-up your immune system: Take 1000 mg of Vitamin C with every meal (3 times a day) the day prior to your procedure. . Antibiotics: Inform the staff if you have a condition or reason that requires you to take antibiotics before dental procedures. . Pregnancy: If you are pregnant, call and cancel the procedure. . Sickness: If you have a cold, fever, or any active infections, call and cancel the procedure. . Arrival: You must be in the facility at least 30 minutes prior to your scheduled procedure. . Children: Do not bring children with you. . Dress appropriately:  Bring dark clothing that you would not mind if they get stained. . Valuables: Do not bring any jewelry or valuables.  Reasons to call and reschedule or cancel your procedure: (Following these recommendations will minimize the risk of a serious complication.) . Surgeries: Avoid having procedures within 2 weeks of any surgery. (Avoid for 2 weeks before or after any surgery). . Flu Shots: Avoid having procedures within 2 weeks of a flu shots or . (Avoid for 2 weeks before or after immunizations). . Barium: Avoid having a procedure within 7-10 days after having had a radiological study involving the use of radiological contrast. (Myelograms, Barium swallow or enema study). . Heart attacks: Avoid any elective procedures or surgeries for the initial 6 months after a "Myocardial Infarction" (Heart Attack). . Blood thinners: It is imperative that you stop these medications before procedures. Let us know if you if you take any blood thinner.  . Infection: Avoid procedures during or within two weeks of an infection (including chest colds or gastrointestinal problems). Symptoms associated with infections include: Localized redness, fever, chills, night sweats or profuse sweating, burning sensation when voiding, cough, congestion, stuffiness, runny nose, sore throat, diarrhea, nausea, vomiting, cold or Flu symptoms, recent or current infections. It is specially important if the infection is over the area that we intend to treat. . Heart and lung problems: Symptoms that may suggest an active cardiopulmonary problem include: cough, chest pain, breathing difficulties or shortness of breath, dizziness, ankle swelling, uncontrolled high or unusually low blood pressure, and/or palpitations. If you are experiencing any of these symptoms, cancel your procedure and contact your primary care physician for an evaluation.  Remember:  Regular Business hours are:    Monday to Thursday 8:00 AM to 4:00 PM  Provider's  Schedule: Genea Rheaume, MD:  Procedure days: Tuesday and Thursday 7:30 AM to 4:00 PM  Bilal Lateef, MD:  Procedure days: Monday and Wednesday 7:30 AM to 4:00 PM ____________________________________________________________________________________________    

## 2020-01-29 ENCOUNTER — Ambulatory Visit
Admission: RE | Admit: 2020-01-29 | Discharge: 2020-01-29 | Disposition: A | Discharge status: Home / Self Care | Payer: Medicare Other | Source: Ambulatory Visit | Attending: Pain Medicine | Admitting: Pain Medicine

## 2020-01-29 ENCOUNTER — Other Ambulatory Visit: Payer: Self-pay

## 2020-01-29 ENCOUNTER — Ambulatory Visit (HOSPITAL_BASED_OUTPATIENT_CLINIC_OR_DEPARTMENT_OTHER): Payer: Medicare Other | Admitting: Pain Medicine

## 2020-01-29 ENCOUNTER — Encounter: Payer: Self-pay | Admitting: Pain Medicine

## 2020-01-29 VITALS — BP 150/61 | HR 77 | Temp 98.1°F | Resp 12 | Ht 71.0 in | Wt 245.0 lb

## 2020-01-29 DIAGNOSIS — M5137 Other intervertebral disc degeneration, lumbosacral region: Secondary | ICD-10-CM | POA: Insufficient documentation

## 2020-01-29 DIAGNOSIS — M545 Low back pain: Secondary | ICD-10-CM | POA: Diagnosis present

## 2020-01-29 DIAGNOSIS — G8929 Other chronic pain: Secondary | ICD-10-CM

## 2020-01-29 DIAGNOSIS — M47816 Spondylosis without myelopathy or radiculopathy, lumbar region: Secondary | ICD-10-CM | POA: Insufficient documentation

## 2020-01-29 MED ORDER — FENTANYL CITRATE (PF) 100 MCG/2ML IJ SOLN
25.0000 ug | INTRAMUSCULAR | Status: DC | PRN
  Administered 2020-01-29: 50 ug via INTRAVENOUS
  Filled 2020-01-29: qty 2

## 2020-01-29 MED ORDER — LIDOCAINE HCL 2 % IJ SOLN
20.0000 mL | Freq: Once | INTRAMUSCULAR | Status: AC
  Administered 2020-01-29: 400 mg
  Filled 2020-01-29: qty 40

## 2020-01-29 MED ORDER — ROPIVACAINE HCL 2 MG/ML IJ SOLN
18.0000 mL | Freq: Once | INTRAMUSCULAR | Status: AC
  Administered 2020-01-29: 18 mL via PERINEURAL
  Filled 2020-01-29: qty 20

## 2020-01-29 MED ORDER — LACTATED RINGERS IV SOLN
1000.0000 mL | Freq: Once | INTRAVENOUS | Status: AC
  Administered 2020-01-29: 1000 mL via INTRAVENOUS

## 2020-01-29 MED ORDER — MIDAZOLAM HCL 5 MG/5ML IJ SOLN
1.0000 mg | INTRAMUSCULAR | Status: DC | PRN
  Administered 2020-01-29: 1 mg via INTRAVENOUS
  Filled 2020-01-29: qty 5

## 2020-01-29 MED ORDER — TRIAMCINOLONE ACETONIDE 40 MG/ML IJ SUSP
80.0000 mg | Freq: Once | INTRAMUSCULAR | Status: AC
  Administered 2020-01-29: 80 mg
  Filled 2020-01-29: qty 2

## 2020-01-29 NOTE — Patient Instructions (Signed)

## 2020-01-29 NOTE — Progress Notes (Addendum)
PROVIDER NOTE: Information contained herein reflects review and annotations entered in association with encounter. Interpretation of such information and data should be left to medically-trained personnel. Information provided to patient can be located elsewhere in the medical record under "Patient Instructions". Document created using STT-dictation technology, any transcriptional errors that may result from process are unintentional.    Patient: Steve Vance  Service Category: Procedure  Provider: Gaspar Cola, MD  DOB: May 29, 1961  DOS: 01/29/2020  Location: [facility]  MRN: [id number]  Setting: Ambulatory - outpatient  Referring Provider: [place] [facility]*  Type: Established Patient  Specialty: Interventional Pain Management  PCP: [place]by   Primary Reason for Visit: Interventional Pain Management Treatment. CC: Back Pain (low)  Procedure:          Anesthesia, Analgesia, Anxiolysis:  Type: Lumbar Facet, Medial Branch Block(s) #2  Primary Purpose: Diagnostic Region: Posterolateral Lumbosacral Spine Level: L2, L3, L4, L5, & S1 Medial Branch Level(s). Injecting these levels blocks the L3-4, L4-5, and L5-S1 lumbar facet joints. Laterality: Bilateral  Type: Moderate (Conscious) Sedation combined with Local Anesthesia Indication(s): Analgesia and Anxiety Route: Intravenous (IV) IV Access: Secured Sedation: Meaningful verbal contact was maintained at all times during the procedure  Local Anesthetic: Lidocaine 1-2%  Position: Prone   Indications: 1. Lumbar facet syndrome (Bilateral)   2. 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 Lumbar facet arthropathy (Multilevel) (Bilateral)   4. DDD (degenerative disc disease), lumbosacral   5. Chronic low back pain (1ry area of Pain) (Bilateral) (R=L)    Pain Score: Pre-procedure: 8 /10 Post-procedure: 0-No pain/10   Note: Today the patient asked about the  injections how often he would need to have them.  I have explained to him that today we will be doing his second diagnostic injection.  I also explained to him that should this one provide [place] with results that are concordant with his first diagnostic injection and both demonstrate that he has a lumbar facet syndrome, then the next step would be to consider radiofrequency ablation for the purpose of obtaining longer lasting benefit.  I have informed the patient that on the average, a successful radiofrequency ablation should provide patients with results that may last between 3 and 18 months.  Those results fall within a bell-shaped curve where the mean is between 6 and 8 months.  Depending on how long of a benefit he gets from these injections, there is the potential that he may need to have them repeated anywhere from 1-2 times a year.  I also took the time today to talk to the patient about what he needs to do in order to improve his condition.  I pointed out that he needs to make sure to bring his BMI down to less than 30, and avoid doing anything that can cause damage to the lining of small blood vessels, such as smoking or having high cholesterol levels.  Pre-op Assessment:  Steve Vance is a 59 y.o. (year old), male patient, seen today for interventional treatment. He  has a past surgical history that includes Left Shoulder Surgery; DIALYSIS/PERMA CATHETER INSERTION (N/A, 04/02/2019); AV fistula placement (Left, 06/27/2019); and DIALYSIS/PERMA CATHETER REMOVAL (N/A, 10/03/2019). Steve Vance has a current medication list which includes the following prescription(s): accu-chek fastclix lancets, amlodipine, aspirin, atorvastatin, blood glucose meter kit and supplies, calcium acetate, cyclobenzaprine, hydrocodone-acetaminophen, labetalol, losartan, and sertraline, and the following Facility-Administered Medications: fentanyl and midazolam. His primarily concern today is the Back  Pain (low)  Initial Vital Signs:    Pulse/HCG Rate: 77ECG Heart Rate: 74 Temp: 98.1 F (36.7 C) Resp: 18 BP: (!) 170/67 SpO2: 100 %  BMI: Estimated body mass index is 34.17 kg/m as calculated from the following:   Height as of this encounter: _0  (1.803 m).   Weight as of this encounter: 245 lb (111.1 kg).  Risk Assessment: Allergies: Reviewed. He is allergic to baclofen; gabapentin; and percocet [oxycodone-acetaminophen].  Allergy Precautions: None required Coagulopathies: Reviewed. None identified.  Blood-thinner therapy: None at this time Active Infection(s): Reviewed. None identified. Steve Vance is afebrile  Site Confirmation: Steve Vance was asked to confirm the procedure and laterality before marking the site Procedure checklist: Completed Consent: Before the procedure and under the influence of no sedative(s), amnesic(s), or anxiolytics, the patient was informed of the treatment options, risks and possible complications. To fulfill our ethical and legal obligations, as recommended by the American Medical Association's Code of Ethics, I have informed the patient of my clinical impression; the nature and purpose of the treatment or procedure; the risks, benefits, and possible complications of the intervention; the alternatives, including doing nothing; the risk(s) and benefit(s) of the alternative treatment(s) or procedure(s); and the risk(s) and benefit(s) of doing nothing. The patient was provided information about the general risks and possible complications associated with the procedure. These may include, but are not limited to: failure to achieve desired goals, infection, bleeding, organ or nerve damage, allergic reactions, paralysis, and death. In addition, the patient was informed of those risks and complications associated to Spine-related procedures, such as failure to decrease pain; infection (i.e.: Meningitis, epidural or intraspinal abscess); bleeding (i.e.: epidural hematoma, subarachnoid hemorrhage, or any  other type of intraspinal or peri-dural bleeding); organ or nerve damage (i.e.: Any type of peripheral nerve, nerve root, or spinal cord injury) with subsequent damage to sensory, motor, and/or autonomic systems, resulting in permanent pain, numbness, and/or weakness of one or several areas of the body; allergic reactions; (i.e.: anaphylactic reaction); and/or death. Furthermore, the patient was informed of those risks and complications associated with the medications. These include, but are not limited to: allergic reactions (i.e.: anaphylactic or anaphylactoid reaction(s)); adrenal axis suppression; blood sugar elevation that in diabetics may result in ketoacidosis or comma; water retention that in patients with history of congestive heart failure may result in shortness of breath, pulmonary edema, and decompensation with resultant heart failure; weight gain; swelling or edema; medication-induced neural toxicity; particulate matter embolism and blood vessel occlusion with resultant organ, and/or nervous system infarction; and/or aseptic necrosis of one or more joints. Finally, the patient was informed that Medicine is not an exact science; therefore, there is also the possibility of unforeseen or unpredictable risks and/or possible complications that may result in a catastrophic outcome. The patient indicated having understood very clearly. We have given the patient no guarantees and we have made no promises. Enough time was given to the patient to ask questions, all of which were answered to the patient's satisfaction. Mr. Kirk has indicated that he wanted to continue with the procedure. Attestation: I, the ordering provider, attest that I have discussed with the patient the benefits, risks, side-effects, alternatives, likelihood of achieving goals, and potential problems during recovery for the procedure that I have provided informed consent. Date  Time: 01/29/2020  9:24 AM  Pre-Procedure Preparation:    Monitoring: As per clinic protocol. Respiration, ETCO2, SpO2, BP, heart rate and rhythm monitor placed and checked for adequate function Safety Precautions:  Patient was assessed for positional comfort and pressure points before starting the procedure. Time-out: I initiated and conducted the "Time-out" before starting the procedure, as per protocol. The patient was asked to participate by confirming the accuracy of the "Time Out" information. Verification of the correct person, site, and procedure were performed and confirmed by me, the nursing staff, and the patient. "Time-out" conducted as per Joint Commission's Universal Protocol (UP.01.01.01). Time: 1023  Description of Procedure:          Laterality: Bilateral. The procedure was performed in identical fashion on both sides. Levels:  L2, L3, L4, L5, & S1 Medial Branch Level(s) Area Prepped: Posterior Lumbosacral Region DuraPrep (Iodine Povacrylex [0.7% available iodine] and Isopropyl Alcohol, 74% w/w) Safety Precautions: Aspiration looking for blood return was conducted prior to all injections. At no point did we inject any substances, as a needle was being advanced. Before injecting, the patient was told to immediately notify me if he was experiencing any new onset of "ringing in the ears, or metallic taste in the mouth". No attempts were made at seeking any paresthesias. Safe injection practices and needle disposal techniques used. Medications properly checked for expiration dates. SDV (single dose vial) medications used. After the completion of the procedure, all disposable equipment used was discarded in the proper designated medical waste containers. Local Anesthesia: Protocol guidelines were followed. The patient was positioned over the fluoroscopy table. The area was prepped in the usual manner. The time-out was completed. The target area was identified using fluoroscopy. A 12-in long, straight, sterile hemostat was used with fluoroscopic  guidance to locate the targets for each level blocked. Once located, the skin was marked with an approved surgical skin marker. Once all sites were marked, the skin (epidermis, dermis, and hypodermis), as well as deeper tissues (fat, connective tissue and muscle) were infiltrated with a small amount of a short-acting local anesthetic, loaded on a 10cc syringe with a 25G, 1.5-in  Needle. An appropriate amount of time was allowed for local anesthetics to take effect before proceeding to the next step. Local Anesthetic: Lidocaine 2.0% The unused portion of the local anesthetic was discarded in the proper designated containers. Technical explanation of process:  L2 Medial Branch Nerve Block (MBB): The target area for the L2 medial branch is at the junction of the postero-lateral aspect of the superior articular process and the superior, posterior, and medial edge of the transverse process of L3. Under fluoroscopic guidance, a Quincke needle was inserted until contact was made with os over the superior postero-lateral aspect of the pedicular shadow (target area). After negative aspiration for blood, 0.5 mL of the nerve block solution was injected without difficulty or complication. The needle was removed intact. L3 Medial Branch Nerve Block (MBB): The target area for the L3 medial branch is at the junction of the postero-lateral aspect of the superior articular process and the superior, posterior, and medial edge of the transverse process of L4. Under fluoroscopic guidance, a Quincke needle was inserted until contact was made with os over the superior postero-lateral aspect of the pedicular shadow (target area). After negative aspiration for blood, 0.5 mL of the nerve block solution was injected without difficulty or complication. The needle was removed intact. L4 Medial Branch Nerve Block (MBB): The target area for the L4 medial branch is at the junction of the postero-lateral aspect of the superior articular  process and the superior, posterior, and medial edge of the transverse process of L5. Under fluoroscopic guidance, a Quincke needle  was inserted until contact was made with os over the superior postero-lateral aspect of the pedicular shadow (target area). After negative aspiration for blood, 0.5 mL of the nerve block solution was injected without difficulty or complication. The needle was removed intact. L5 Medial Branch Nerve Block (MBB): The target area for the L5 medial branch is at the junction of the postero-lateral aspect of the superior articular process and the superior, posterior, and medial edge of the sacral ala. Under fluoroscopic guidance, a Quincke needle was inserted until contact was made with os over the superior postero-lateral aspect of the pedicular shadow (target area). After negative aspiration for blood, 0.5 mL of the nerve block solution was injected without difficulty or complication. The needle was removed intact. S1 Medial Branch Nerve Block (MBB): The target area for the S1 medial branch is at the posterior and inferior 6 o'clock position of the L5-S1 facet joint. Under fluoroscopic guidance, the Quincke needle inserted for the L5 MBB was redirected until contact was made with os over the inferior and postero aspect of the sacrum, at the 6 o' clock position under the L5-S1 facet joint (Target area). After negative aspiration for blood, 0.5 mL of the nerve block solution was injected without difficulty or complication. The needle was removed intact.  Nerve block solution: 0.2% PF-Ropivacaine + Triamcinolone (40 mg/mL) diluted to a final concentration of 4 mg of Triamcinolone/mL of Ropivacaine The unused portion of the solution was discarded in the proper designated containers. Procedural Needles: 22-gauge, 3.5-inch, Quincke needles used for all levels.  Once the entire procedure was completed, the treated area was cleaned, making sure to leave some of the prepping solution back to  take advantage of its long term bactericidal properties.   Illustration of the posterior view of the lumbar spine and the posterior neural structures. Laminae of L2 through S1 are labeled. DPRL5, dorsal primary ramus of L5; DPRS1, dorsal primary ramus of S1; DPR3, dorsal primary ramus of L3; FJ, facet (zygapophyseal) joint L3-L4; I, inferior articular process of L4; LB1, lateral branch of dorsal primary ramus of L1; IAB, inferior articular branches from L3 medial branch (supplies L4-L5 facet joint); IBP, intermediate branch plexus; MB3, medial branch of dorsal primary ramus of L3; NR3, third lumbar nerve root; S, superior articular process of L5; SAB, superior articular branches from L4 (supplies L4-5 facet joint also); TP3, transverse process of L3.  Vitals:   01/29/20 1033 01/29/20 1043 01/29/20 1053 01/29/20 1103  BP: (!) 138/56 (!) 149/66 (!) 153/53 (!) 150/61  Pulse:      Resp: _0 Temp:  98.3 F (36.8 C)  98.1 F (36.7 C)  SpO2: 97% 100% 100% 100%  Weight:      Height:         Start Time: 1023 hrs. End Time: 1032 hrs.  Imaging Guidance (Spinal):          Type of Imaging Technique: Fluoroscopy Guidance (Spinal) Indication(s): Assistance in needle guidance and placement for procedures requiring needle placement in or near specific anatomical locations not easily accessible without such assistance. Exposure Time: Please see nurses notes. Contrast: None used. Fluoroscopic Guidance: I was personally present during the use of fluoroscopy. "Tunnel Vision Technique" used to obtain the best possible view of the target area. Parallax error corrected before commencing the procedure. "Direction-depth-direction" technique used to introduce the needle under continuous pulsed fluoroscopy. Once target was reached, antero-posterior, oblique, and lateral fluoroscopic projection used confirm needle placement in all planes. Images  permanently stored in EMR. Interpretation: No contrast injected.  I personally interpreted the imaging intraoperatively. Adequate needle placement confirmed in multiple planes. Permanent images saved into the patient's record.  Antibiotic Prophylaxis:   Anti-infectives (From admission, onward)   None     Indication(s): None identified  Post-operative Assessment:  Post-procedure Vital Signs:  Pulse/HCG Rate: 7771 Temp: 98.1 F (36.7 C) Resp: 12 BP: (!) 150/61 SpO2: 100 %  EBL: None  Complications: No immediate post-treatment complications observed by team, or reported by patient.  Note: The patient tolerated the entire procedure well. A repeat set of vitals were taken after the procedure and the patient was kept under observation following institutional policy, for this type of procedure. Post-procedural neurological assessment was performed, showing return to baseline, prior to discharge. The patient was provided with post-procedure discharge instructions, including a section on how to identify potential problems. Should any problems arise concerning this procedure, the patient was given instructions to immediately contact us, at any time, without hesitation. In any case, we plan to contact the patient by telephone for a follow-up status report regarding this interventional procedure.  Comments:  No additional relevant information.  Plan of Care  Orders:  Orders Placed This Encounter  Procedures  . LUMBAR FACET(MEDIAL BRANCH NERVE BLOCK) MBNB    Scheduling Instructions:     Procedure: Lumbar facet block (AKA.: Lumbosacral medial branch nerve block)     Side: Bilateral     Level: L3-4, L4-5, & L5-S1 Facets (L2, L3, L4, L5, & S1 Medial Branch Nerves)     Sedation: Patient's choice.     Timeframe: Today    Order Specific Question:   Where will this procedure be performed?    Answer:   [facility]  . [facility] C-ARM 1-60 MIN NO REPORT    Intraoperative interpretation by procedural physician at [place].    Standing  Status:   Standing    Number of Occurrences:   1    Order Specific Question:   Reason for exam:    Answer:   Assistance in needle guidance and placement for procedures requiring needle placement in or near specific anatomical locations not easily accessible without such assistance.  . Informed Consent Details: Physician/Practitioner Attestation; Transcribe to consent form and obtain patient signature    Nursing Order: Transcribe to consent form and obtain patient signature. Note: Always confirm laterality of pain with Mr. Ardito, before procedure. Procedure: Lumbar Facet Block  under fluoroscopic guidance Indication/Reason: Low Back Pain, with our without leg pain, due to Facet Joint Arthralgia (Joint Pain) known as Lumbar Facet Syndrome, secondary to Lumbar, and/or Lumbosacral Spondylosis (Arthritis of the Spine), without myelopathy or radiculopathy (Nerve Damage). Provider Attestation: I, Carnuel Dossie Arbour, MD, (Pain Management Specialist), the physician/practitioner, attest that I have discussed with the patient the benefits, risks, side effects, alternatives, likelihood of achieving goals and potential problems during recovery for the procedure that I have provided informed consent.  . Provide equipment / supplies at bedside    Equipment required: Single use, disposable, "Block Tray"    Standing Status:   Standing    Number of Occurrences:   1    Order Specific Question:   Specify    Answer:   Block Tray   Chronic Opioid Analgesic:  Hydrocodone/APAP 5/325, 1 tab PO BID to q4hrs Highest recorded MME/day: 35 mg/day MME/day: 10 mg/day   Medications ordered for procedure: Meds ordered this encounter  Medications  . lidocaine (XYLOCAINE) 2 % (  with pres) injection 400 mg  . lactated ringers infusion 1,000 mL  . midazolam (VERSED) 5 MG/5ML injection 1-2 mg    Make sure Flumazenil is available in the pyxis when using this medication. If oversedation occurs, administer 0.2 mg IV over 15 sec.  If after 45 sec no response, administer 0.2 mg again over 1 min; may repeat at 1 min intervals; not to exceed 4 doses (1 mg)  . fentaNYL (SUBLIMAZE) injection 25-50 mcg    Make sure Narcan is available in the pyxis when using this medication. In the event of respiratory depression (RR< 8/min): Titrate NARCAN (naloxone) in increments of 0.1 to 0.2 mg IV at 2-3 minute intervals, until desired degree of reversal.  . ropivacaine (PF) 2 mg/mL (0.2%) (NAROPIN) injection 18 mL  . triamcinolone acetonide (KENALOG-40) injection 80 mg   Medications administered: We administered lidocaine, lactated ringers, midazolam, fentaNYL, ropivacaine (PF) 2 mg/mL (0.2%), and triamcinolone acetonide.  See the medical record for exact dosing, route, and time of administration.  Follow-up plan:   Return in about 2 weeks (around 02/12/2020) for (VV), (PP).       Interventional treatment options: Planned, scheduled, and/or pending:      Under consideration:   Diagnostic bilateral L2 TFESI  Possible bilateral lumbar facet RFA  Diagnostic right-sided CESI  Diagnostic bilateral cervical facet block  Possible bilateral cervical facet RFA    Therapeutic/palliative (PRN):   Palliative bilateral lumbar facet block #3  Diagnostic midline L2-3 LESI #2     Recent Visits Date Type Provider Dept  01/01/20 Telemedicine Milinda Pointer, MD [facility]  12/11/19 Procedure visit Milinda Pointer, [facility]  12/05/19 Telemedicine Milinda Pointer, MD [facility]  11/22/19 Procedure visit Milinda Pointer, MD [facility]  11/14/19 Office Visit Milinda Pointer, MD [facility]  Showing recent visits within past 90 days and meeting all other requirements   Today's Visits Date Type Provider Dept  01/29/20 Procedure visit Milinda Pointer, MD [facility]  Showing today's visits and meeting all other requirements   Future Appointments Date Type  Provider Dept  02/13/20 Appointment Milinda Pointer, MD [facility]  Showing future appointments within next 90 days and meeting all other requirements   Disposition: Discharge home  Discharge (Date  Time): 01/29/2020; 1104 hrs.   Primary Care Physician: [place] Location: [facility] Note by: Gaspar Cola, MD Date: 01/29/2020; Time: 12:17 PM  Disclaimer:  Medicine is not an Chief Strategy Officer. The only guarantee in medicine is that nothing is guaranteed. It is important to note that the decision to proceed with this intervention was based on the information collected from the patient. The Data and conclusions were drawn from the patient's questionnaire, the interview, and the physical examination. Because the information was provided in large part by the patient, it cannot be guaranteed that it has not been purposely or unconsciously manipulated. Every effort has been made to obtain as much relevant data as possible for this evaluation. It is important to note that the conclusions that lead to this procedure are derived in large part from the available data. Always take into account that the treatment will also be dependent on availability of resources and existing treatment guidelines, considered by other Pain Management Practitioners as being common knowledge and practice, at the time of the intervention. For Medico-Legal purposes, it is also important to point out that variation in procedural techniques and pharmacological choices are the  acceptable norm. The indications, contraindications, technique, and results of the above procedure should only be interpreted and judged by a Board-Certified Interventional Pain Specialist with extensive familiarity and expertise in the same exact procedure and technique.

## 2020-01-29 NOTE — Progress Notes (Signed)
Safety precautions to be maintained throughout the outpatient stay will include: orient to surroundings, keep bed in low position, maintain call bell within reach at all times, provide assistance with transfer out of bed and ambulation.  

## 2020-01-30 ENCOUNTER — Telehealth: Payer: Self-pay | Admitting: Pain Medicine

## 2020-01-30 ENCOUNTER — Telehealth: Payer: Self-pay

## 2020-01-30 NOTE — Telephone Encounter (Signed)
Patient came back in to office asking about a statement from Dr. Dossie Arbour that the patient would have to have pain injections for the rest of his life. He says he spoke with Dr. Dossie Arbour about this already and Dr. Dossie Arbour agreed to write this. Patient became upset because Dr. Dossie Arbour was not here. Patient states he will be here first thing Tue. To get this statement. I did advise him to call before coming in to make sure it was ready. Please check with Dr. Dossie Arbour and call patient when this can be picked up. Thank you

## 2020-01-30 NOTE — Telephone Encounter (Signed)
Post procedure phone call.  Patient states he is doing good.  

## 2020-02-12 ENCOUNTER — Encounter: Payer: Self-pay | Admitting: Pain Medicine
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
## 2020-02-13 ENCOUNTER — Other Ambulatory Visit: Payer: Self-pay

## 2020-02-13 ENCOUNTER — Ambulatory Visit: Payer: Medicare Other | Attending: Pain Medicine | Admitting: Pain Medicine

## 2020-02-13 DIAGNOSIS — M542 Cervicalgia: Secondary | ICD-10-CM

## 2020-02-13 DIAGNOSIS — M47816 Spondylosis without myelopathy or radiculopathy, lumbar region: Secondary | ICD-10-CM

## 2020-02-13 DIAGNOSIS — M545 Low back pain: Secondary | ICD-10-CM

## 2020-02-13 DIAGNOSIS — M79604 Pain in right leg: Secondary | ICD-10-CM

## 2020-02-13 DIAGNOSIS — G8929 Other chronic pain: Secondary | ICD-10-CM

## 2020-02-13 DIAGNOSIS — M79605 Pain in left leg: Secondary | ICD-10-CM

## 2020-02-13 DIAGNOSIS — G894 Chronic pain syndrome: Secondary | ICD-10-CM | POA: Diagnosis not present

## 2020-02-13 DIAGNOSIS — M48062 Spinal stenosis, lumbar region with neurogenic claudication: Secondary | ICD-10-CM

## 2020-02-13 NOTE — Patient Instructions (Signed)
____________________________________________________________________________________________  Preparing for Procedure with Sedation  Procedure appointments are limited to planned procedures: . No Prescription Refills. . No disability issues will be discussed. . No medication changes will be discussed.  Instructions: . Oral Intake: Do not eat or drink anything for at least 8 hours prior to your procedure. (Exception: Blood Pressure Medication. See below.) . Transportation: Unless otherwise stated by your physician, you may drive yourself after the procedure. . Blood Pressure Medicine: Do not forget to take your blood pressure medicine with a sip of water the morning of the procedure. If your Diastolic (lower reading)is above 100 mmHg, elective cases will be cancelled/rescheduled. . Blood thinners: These will need to be stopped for procedures. Notify our staff if you are taking any blood thinners. Depending on which one you take, there will be specific instructions on how and when to stop it. . Diabetics on insulin: Notify the staff so that you can be scheduled 1st case in the morning. If your diabetes requires high dose insulin, take only  of your normal insulin dose the morning of the procedure and notify the staff that you have done so. . Preventing infections: Shower with an antibacterial soap the morning of your procedure. . Build-up your immune system: Take 1000 mg of Vitamin C with every meal (3 times a day) the day prior to your procedure. . Antibiotics: Inform the staff if you have a condition or reason that requires you to take antibiotics before dental procedures. . Pregnancy: If you are pregnant, call and cancel the procedure. . Sickness: If you have a cold, fever, or any active infections, call and cancel the procedure. . Arrival: You must be in the facility at least 30 minutes prior to your scheduled procedure. . Children: Do not bring children with you. . Dress appropriately:  Bring dark clothing that you would not mind if they get stained. . Valuables: Do not bring any jewelry or valuables.  Reasons to call and reschedule or cancel your procedure: (Following these recommendations will minimize the risk of a serious complication.) . Surgeries: Avoid having procedures within 2 weeks of any surgery. (Avoid for 2 weeks before or after any surgery). . Flu Shots: Avoid having procedures within 2 weeks of a flu shots or . (Avoid for 2 weeks before or after immunizations). . Barium: Avoid having a procedure within 7-10 days after having had a radiological study involving the use of radiological contrast. (Myelograms, Barium swallow or enema study). . Heart attacks: Avoid any elective procedures or surgeries for the initial 6 months after a "Myocardial Infarction" (Heart Attack). . Blood thinners: It is imperative that you stop these medications before procedures. Let us know if you if you take any blood thinner.  . Infection: Avoid procedures during or within two weeks of an infection (including chest colds or gastrointestinal problems). Symptoms associated with infections include: Localized redness, fever, chills, night sweats or profuse sweating, burning sensation when voiding, cough, congestion, stuffiness, runny nose, sore throat, diarrhea, nausea, vomiting, cold or Flu symptoms, recent or current infections. It is specially important if the infection is over the area that we intend to treat. . Heart and lung problems: Symptoms that may suggest an active cardiopulmonary problem include: cough, chest pain, breathing difficulties or shortness of breath, dizziness, ankle swelling, uncontrolled high or unusually low blood pressure, and/or palpitations. If you are experiencing any of these symptoms, cancel your procedure and contact your primary care physician for an evaluation.  Remember:  Regular Business hours are:    Monday to Thursday 8:00 AM to 4:00 PM  Provider's  Schedule: Kemper Heupel, MD:  Procedure days: Tuesday and Thursday 7:30 AM to 4:00 PM  Bilal Lateef, MD:  Procedure days: Monday and Wednesday 7:30 AM to 4:00 PM ____________________________________________________________________________________________    

## 2020-02-18 ENCOUNTER — Ambulatory Visit (INDEPENDENT_AMBULATORY_CARE_PROVIDER_SITE_OTHER): Payer: Medicare Other

## 2020-02-18 ENCOUNTER — Ambulatory Visit (INDEPENDENT_AMBULATORY_CARE_PROVIDER_SITE_OTHER): Payer: Medicare Other | Admitting: Vascular Surgery

## 2020-02-18 ENCOUNTER — Other Ambulatory Visit: Payer: Self-pay

## 2020-02-18 ENCOUNTER — Encounter (INDEPENDENT_AMBULATORY_CARE_PROVIDER_SITE_OTHER): Payer: Self-pay | Admitting: Vascular Surgery

## 2020-02-18 VITALS — Resp 16 | Wt 253.8 lb

## 2020-02-18 DIAGNOSIS — N186 End stage renal disease: Secondary | ICD-10-CM

## 2020-02-18 DIAGNOSIS — E1165 Type 2 diabetes mellitus with hyperglycemia: Secondary | ICD-10-CM | POA: Diagnosis not present

## 2020-02-18 DIAGNOSIS — I10 Essential (primary) hypertension: Secondary | ICD-10-CM | POA: Diagnosis not present

## 2020-02-18 DIAGNOSIS — T829XXA Unspecified complication of cardiac and vascular prosthetic device, implant and graft, initial encounter: Secondary | ICD-10-CM | POA: Insufficient documentation

## 2020-02-18 DIAGNOSIS — T829XXS Unspecified complication of cardiac and vascular prosthetic device, implant and graft, sequela: Secondary | ICD-10-CM
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
## 2020-02-19 ENCOUNTER — Ambulatory Visit
Admission: RE | Admit: 2020-02-19 | Discharge: 2020-02-19 | Disposition: A | Discharge status: Home / Self Care | Payer: Medicare Other | Source: Ambulatory Visit | Attending: Pain Medicine | Admitting: Pain Medicine

## 2020-02-19 ENCOUNTER — Encounter: Payer: Self-pay | Admitting: Pain Medicine

## 2020-02-19 ENCOUNTER — Ambulatory Visit (HOSPITAL_BASED_OUTPATIENT_CLINIC_OR_DEPARTMENT_OTHER): Payer: Medicare Other | Admitting: Pain Medicine

## 2020-02-19 VITALS — BP 138/77 | HR 83 | Temp 98.5°F | Resp 18 | Ht 71.0 in | Wt 248.0 lb

## 2020-02-19 DIAGNOSIS — M79605 Pain in left leg: Secondary | ICD-10-CM

## 2020-02-19 DIAGNOSIS — M48062 Spinal stenosis, lumbar region with neurogenic claudication: Secondary | ICD-10-CM | POA: Insufficient documentation

## 2020-02-19 DIAGNOSIS — M545 Low back pain: Secondary | ICD-10-CM

## 2020-02-19 DIAGNOSIS — G8929 Other chronic pain: Secondary | ICD-10-CM | POA: Diagnosis present

## 2020-02-19 DIAGNOSIS — M5137 Other intervertebral disc degeneration, lumbosacral region: Secondary | ICD-10-CM

## 2020-02-19 DIAGNOSIS — M4807 Spinal stenosis, lumbosacral region: Secondary | ICD-10-CM | POA: Insufficient documentation

## 2020-02-19 DIAGNOSIS — M79604 Pain in right leg: Secondary | ICD-10-CM | POA: Diagnosis present

## 2020-02-19 MED ORDER — TRIAMCINOLONE ACETONIDE 40 MG/ML IJ SUSP
INTRAMUSCULAR | Status: AC
  Filled 2020-02-19: qty 1

## 2020-02-19 MED ORDER — SODIUM CHLORIDE (PF) 0.9 % IJ SOLN
INTRAMUSCULAR | Status: AC
  Filled 2020-02-19: qty 10

## 2020-02-19 MED ORDER — ROPIVACAINE HCL 2 MG/ML IJ SOLN
2.0000 mL | Freq: Once | INTRAMUSCULAR | Status: AC
  Administered 2020-02-19: 2 mL via EPIDURAL

## 2020-02-19 MED ORDER — TRIAMCINOLONE ACETONIDE 40 MG/ML IJ SUSP
40.0000 mg | Freq: Once | INTRAMUSCULAR | Status: AC
  Administered 2020-02-19: 40 mg

## 2020-02-19 MED ORDER — LIDOCAINE HCL 2 % IJ SOLN
INTRAMUSCULAR | Status: AC
  Filled 2020-02-19: qty 20

## 2020-02-19 MED ORDER — LACTATED RINGERS IV SOLN
1000.0000 mL | Freq: Once | INTRAVENOUS | Status: AC
  Administered 2020-02-19: 1000 mL via INTRAVENOUS

## 2020-02-19 MED ORDER — FENTANYL CITRATE (PF) 100 MCG/2ML IJ SOLN
25.0000 ug | INTRAMUSCULAR | Status: DC | PRN
  Administered 2020-02-19: 50 ug via INTRAVENOUS

## 2020-02-19 MED ORDER — MIDAZOLAM HCL 5 MG/5ML IJ SOLN
INTRAMUSCULAR | Status: AC
  Filled 2020-02-19: qty 5

## 2020-02-19 MED ORDER — FENTANYL CITRATE (PF) 100 MCG/2ML IJ SOLN
INTRAMUSCULAR | Status: AC
  Filled 2020-02-19: qty 2

## 2020-02-19 MED ORDER — MIDAZOLAM HCL 5 MG/5ML IJ SOLN
1.0000 mg | INTRAMUSCULAR | Status: DC | PRN
  Administered 2020-02-19: 2 mg via INTRAVENOUS

## 2020-02-19 MED ORDER — SODIUM CHLORIDE 0.9% FLUSH
2.0000 mL | Freq: Once | INTRAVENOUS | Status: AC
  Administered 2020-02-19: 2 mL

## 2020-02-19 MED ORDER — ROPIVACAINE HCL 2 MG/ML IJ SOLN
INTRAMUSCULAR | Status: AC
  Filled 2020-02-19: qty 10

## 2020-02-19 MED ORDER — LIDOCAINE HCL 2 % IJ SOLN
20.0000 mL | Freq: Once | INTRAMUSCULAR | Status: AC
  Administered 2020-02-19: 400 mg

## 2020-02-19 NOTE — Patient Instructions (Addendum)

## 2020-02-19 NOTE — Progress Notes (Signed)
PROVIDER NOTE: Information contained herein reflects review and annotations entered in association with encounter. Interpretation of such information and data should be left to medically-trained personnel. Information provided to patient can be located elsewhere in the medical record under "Patient Instructions". Document created using STT-dictation technology, any transcriptional errors that may result from process are unintentional.    Patient: Steve Vance  Service Category: Procedure  Provider: Gaspar Cola, MD  DOB: [birth year]  DOS: 02/19/2020  Location: [facility]  MRN: [id number]  Setting: Ambulatory - outpatient  Referring Provider: [facility]*  Type: Established Patient  Specialty: Interventional Pain Management  PCP: Theotis Burrow, MD   Primary Reason for Visit: Interventional Pain Management Treatment. CC: Back Pain (lower)  Procedure:          Anesthesia, Analgesia, Anxiolysis:  Type: Therapeutic Inter-Laminar Epidural Steroid Injection  #2  Region: Lumbar Level: L2-3 Level. Laterality: Midline         Type: Moderate (Conscious) Sedation combined with Local Anesthesia Indication(s): Analgesia and Anxiety Route: Intravenous (IV) IV Access: Secured Sedation: Meaningful verbal contact was maintained at all times during the procedure  Local Anesthetic: Lidocaine 1-2%  Position: Prone with head of the table was raised to facilitate breathing.   Indications: 1. Chronic low back pain (1ry area of Pain) (Bilateral) (R=L)   2. DDD (degenerative disc disease), lumbosacral   3. 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 Chronic lower extremity pain (3ry area of Pain) (Bilateral) (R>L)    Pain Score: Pre-procedure: 8 /10 Post-procedure: 0-No pain/10   Pre-op Assessment:  Steve Vance is a 59 y.o. (year old), male patient, seen today for  interventional treatment. He  has a past surgical history that includes Left Shoulder Surgery; DIALYSIS/PERMA CATHETER INSERTION (N/A, 04/02/2019); AV fistula placement (Left, 06/27/2019); and DIALYSIS/PERMA CATHETER REMOVAL (N/A, 10/03/2019). Steve Vance has a current medication list which includes the following prescription(s): accu-chek fastclix lancets, amlodipine, aspirin, atorvastatin, blood glucose meter kit and supplies, calcium acetate, cyclobenzaprine, hydrocodone-acetaminophen, labetalol, losartan, and sertraline, and the following Facility-Administered Medications: fentanyl and midazolam. His primarily concern today is the Back Pain (lower)  Initial Vital Signs:  Pulse/HCG Rate: 83ECG Heart Rate: 79 Temp: 98.4 F (36.9 C) Resp: 16 BP: (!) 142/83 SpO2: 99 %  BMI: Estimated body mass index is 34.59 kg/m as calculated from the following:   Height as of this encounter: '5\' 11"'  (1.803 m).   Weight as of this encounter: 248 lb (112.5 kg).  Risk Assessment: Allergies: Reviewed. He is allergic to baclofen, gabapentin, and percocet [oxycodone-acetaminophen].  Allergy Precautions: None required Coagulopathies: Reviewed. None identified.  Blood-thinner therapy: None at this time Active Infection(s): Reviewed. None identified. Steve Vance is afebrile  Site Confirmation: Steve Vance was asked to confirm the procedure and laterality before marking the site Procedure checklist: Completed Consent: Before the procedure and under the influence of no sedative(s), amnesic(s), or anxiolytics, the patient was informed of the treatment options, risks and possible complications. To fulfill our ethical and legal obligations, as recommended by the American Medical Association's Code of Ethics, I have informed the patient of my clinical impression; the nature and purpose of the treatment or procedure; the risks, benefits, and possible complications of the intervention; the alternatives, including doing nothing; the  risk(s) and benefit(s) of the alternative treatment(s) or procedure(s); and the risk(s) and benefit(s) of doing nothing. The patient was provided information about  the general risks and possible complications associated with the procedure. These may include, but are not limited to: failure to achieve desired goals, infection, bleeding, organ or nerve damage, allergic reactions, paralysis, and death. In addition, the patient was informed of those risks and complications associated to Spine-related procedures, such as failure to decrease pain; infection (i.e.: Meningitis, epidural or intraspinal abscess); bleeding (i.e.: epidural hematoma, subarachnoid hemorrhage, or any other type of intraspinal or peri-dural bleeding); organ or nerve damage (i.e.: Any type of peripheral nerve, nerve root, or spinal cord injury) with subsequent damage to sensory, motor, and/or autonomic systems, resulting in permanent pain, numbness, and/or weakness of one or several areas of the body; allergic reactions; (i.e.: anaphylactic reaction); and/or death. Furthermore, the patient was informed of those risks and complications associated with the medications. These include, but are not limited to: allergic reactions (i.e.: anaphylactic or anaphylactoid reaction(s)); adrenal axis suppression; blood sugar elevation that in diabetics may result in ketoacidosis or comma; water retention that in patients with history of congestive heart failure may result in shortness of breath, pulmonary edema, and decompensation with resultant heart failure; weight gain; swelling or edema; medication-induced neural toxicity; particulate matter embolism and blood vessel occlusion with resultant organ, and/or nervous system infarction; and/or aseptic necrosis of one or more joints. Finally, the patient was informed that Medicine is not an exact science; therefore, there is also the possibility of unforeseen or unpredictable risks and/or possible complications  that may result in a catastrophic outcome. The patient indicated having understood very clearly. We have given the patient no guarantees and we have made no promises. Enough time was given to the patient to ask questions, all of which were answered to the patient's satisfaction. Steve Vance has indicated that he wanted to continue with the procedure. Attestation: I, the ordering provider, attest that I have discussed with the patient the benefits, risks, side-effects, alternatives, likelihood of achieving goals, and potential problems during recovery for the procedure that I have provided informed consent. Date  Time: 02/19/2020 12:30 PM  Pre-Procedure Preparation:  Monitoring: As per clinic protocol. Respiration, ETCO2, SpO2, BP, heart rate and rhythm monitor placed and checked for adequate function Safety Precautions: Patient was assessed for positional comfort and pressure points before starting the procedure. Time-out: I initiated and conducted the "Time-out" before starting the procedure, as per protocol. The patient was asked to participate by confirming the accuracy of the "Time Out" information. Verification of the correct person, site, and procedure were performed and confirmed by me, the nursing staff, and the patient. "Time-out" conducted as per Joint Commission's Universal Protocol (UP.01.01.01). Time: 1305  Description of Procedure:          Target Area: The interlaminar space, initially targeting the lower laminar border of the superior vertebral body. Approach: Paramedial approach. Area Prepped: Entire Posterior Lumbar Region DuraPrep (Iodine Povacrylex [0.7% available iodine] and Isopropyl Alcohol, 74% w/w) Safety Precautions: Aspiration looking for blood return was conducted prior to all injections. At no point did we inject any substances, as a needle was being advanced. No attempts were made at seeking any paresthesias. Safe injection practices and needle disposal techniques used.  Medications properly checked for expiration dates. SDV (single dose vial) medications used. Description of the Procedure: Protocol guidelines were followed. The procedure needle was introduced through the skin, ipsilateral to the reported pain, and advanced to the target area. Bone was contacted and the needle walked caudad, until the lamina was cleared. The epidural space was identified using "loss-of-resistance  technique" with 2-3 ml of PF-NaCl (0.9% NSS), in a 5cc LOR glass syringe.  Vitals:   02/19/20 1318 02/19/20 1325 02/19/20 1336 02/19/20 1346  BP: (!) 159/81 (!) 152/83 (!) 145/82 138/77  Pulse: 83     Resp: '16 16 18 18  ' Temp:  98.5 F (36.9 C)    TempSrc:      SpO2: 97% 100% 100% 98%  Weight:      Height:        Start Time: 1305 hrs. End Time: 1317 hrs.  Materials:  Needle(s) Type: Epidural needle Gauge: 17G Length: 3.5-in Medication(s): Please see orders for medications and dosing details.  Imaging Guidance (Spinal):          Type of Imaging Technique: Fluoroscopy Guidance (Spinal) Indication(s): Assistance in needle guidance and placement for procedures requiring needle placement in or near specific anatomical locations not easily accessible without such assistance. Exposure Time: Please see nurses notes. Contrast: Before injecting any contrast, we confirmed that the patient did not have an allergy to iodine, shellfish, or radiological contrast. Once satisfactory needle placement was completed at the desired level, radiological contrast was injected. Contrast injected under live fluoroscopy. No contrast complications. See chart for type and volume of contrast used. Fluoroscopic Guidance: I was personally present during the use of fluoroscopy. "Tunnel Vision Technique" used to obtain the best possible view of the target area. Parallax error corrected before commencing the procedure. "Direction-depth-direction" technique used to introduce the needle under continuous pulsed  fluoroscopy. Once target was reached, antero-posterior, oblique, and lateral fluoroscopic projection used confirm needle placement in all planes. Images permanently stored in EMR. Interpretation: I personally interpreted the imaging intraoperatively. Adequate needle placement confirmed in multiple planes. Appropriate spread of contrast into desired area was observed. No evidence of afferent or efferent intravascular uptake. No intrathecal or subarachnoid spread observed. Permanent images saved into the patient's record.  Antibiotic Prophylaxis:   Anti-infectives (From admission, onward)   None     Indication(s): None identified  Post-operative Assessment:  Post-procedure Vital Signs:  Pulse/HCG Rate: 8381 Temp: 98.5 F (36.9 C) Resp: 18 BP: 138/77 SpO2: 98 %  EBL: None  Complications: No immediate post-treatment complications observed by team, or reported by patient.  Note: The patient tolerated the entire procedure well. A repeat set of vitals were taken after the procedure and the patient was kept under observation following institutional policy, for this type of procedure. Post-procedural neurological assessment was performed, showing return to baseline, prior to discharge. The patient was provided with post-procedure discharge instructions, including a section on how to identify potential problems. Should any problems arise concerning this procedure, the patient was given instructions to immediately contact us, at any time, without hesitation. In any case, we plan to contact the patient by telephone for a follow-up status report regarding this interventional procedure.  Comments:  No additional relevant information.  Plan of Care  Orders:  Orders Placed This Encounter  Procedures  . Lumbar Epidural Injection    Scheduling Instructions:     Procedure: Interlaminar LESI L2-3     Laterality: Midline     Sedation: Patient's choice     Timeframe:  Today    Order Specific  Question:   Where will this procedure be performed?    Answer:   [facility]  . DG PA[facility] C-ARM 1-60 MIN NO REPORT    Intraoperative interpretation by procedural physician at [place].    Standing Status:   Standing  Number of Occurrences:   1    Order Specific Question:   Reason for exam:    Answer:   Assistance in needle guidance and placement for procedures requiring needle placement in or near specific anatomical locations not easily accessible without such assistance.  . Informed Consent Details: Physician/Practitioner Attestation; Transcribe to consent form and obtain patient signature    Provider Attestation: I, Koosharem Dossie Arbour, MD, (Pain Management Specialist), the physician/practitioner, attest that I have discussed with the patient the benefits, risks, side effects, alternatives, likelihood of achieving goals and potential problems during recovery for the procedure that I have provided informed consent.    Scheduling Instructions:     Procedure: Lumbar epidural steroid injection under fluoroscopic guidance     Indications: Low back and/or lower extremity pain secondary to lumbar radiculitis     Note: Always confirm laterality of pain with Steve Vance, before procedure.     Transcribe to consent form and obtain patient signature.  . Provide equipment / supplies at bedside    Equipment required: Single use, disposable, "Epidural Tray" Epidural Catheter: NOT required    Standing Status:   Standing    Number of Occurrences:   1    Order Specific Question:   Specify    Answer:   Epidural Tray   Chronic Opioid Analgesic:  No opioid analgesics prescribed by our practice. Highest recorded MME/day: 35 mg/day MME/day: 0 mg/day   Medications ordered for procedure: Meds ordered this encounter  Medications  . lidocaine (XYLOCAINE) 2 % (with pres) injection 400 mg  . lactated ringers infusion 1,000 mL  . midazolam (VERSED) 5 MG/5ML injection 1-2 mg     Make sure Flumazenil is available in the pyxis when using this medication. If oversedation occurs, administer 0.2 mg IV over 15 sec. If after 45 sec no response, administer 0.2 mg again over 1 min; may repeat at 1 min intervals; not to exceed 4 doses (1 mg)  . fentaNYL (SUBLIMAZE) injection 25-50 mcg    Make sure Narcan is available in the pyxis when using this medication. In the event of respiratory depression (RR< 8/min): Titrate NARCAN (naloxone) in increments of 0.1 to 0.2 mg IV at 2-3 minute intervals, until desired degree of reversal.  . sodium chloride flush (NS) 0.9 % injection 2 mL  . ropivacaine (PF) 2 mg/mL (0.2%) (NAROPIN) injection 2 mL  . triamcinolone acetonide (KENALOG-40) injection 40 mg   Medications administered: We administered lidocaine, lactated ringers, midazolam, fentaNYL, sodium chloride flush, ropivacaine (PF) 2 mg/mL (0.2%), and triamcinolone acetonide.  See the medical record for exact dosing, route, and time of administration.  Follow-up plan:   Return in about 2 weeks (around 03/04/2020) for (VV), (PP).       Interventional treatment options: Planned, scheduled, and/or pending:      Under consideration:   Diagnostic bilateral L2 TFESI  Possible bilateral lumbar facet RFA  Diagnostic right-sided CESI  Diagnostic bilateral cervical facet block  Possible bilateral cervical facet RFA    Therapeutic/palliative (PRN):   Palliative bilateral lumbar facet block #3  Diagnostic midline L2-3 LESI #2       Recent Visits Date Type Provider Dept  02/13/20 Telemedicine Milinda Pointer, MD [facility]  01/29/20 Procedure visit Milinda Pointer, MD [facility]  01/01/20 Telemedicine Milinda Pointer, MD [facility]  12/11/19 Procedure visit Milinda Pointer, [facility]  12/05/19 Telemedicine Milinda Pointer, MD [facility]  11/22/19 Procedure visit Milinda Pointer, MD [facility]  Showing  recent visits within past 90 days and meeting all other requirements Today's Visits Date Type Provider Dept  02/19/20 Procedure visit Milinda Pointer, MD [facility]  Showing today's visits and meeting all other requirements Future Appointments Date Type Provider Dept  03/04/20 Appointment Milinda Pointer, MD [facility]  Showing future appointments within next 90 days and meeting all other requirements  Disposition: Discharge home  Discharge (Date  Time): 02/19/2020; 1346 hrs.   Primary Care Physician: Theotis Burrow, MD Location: [facility] Note by: Gaspar Cola, MD Date: 02/19/2020; Time: 2:57 PM  Disclaimer:  Medicine is not an Chief Strategy Officer. The only guarantee in medicine is that nothing is guaranteed. It is important to note that the decision to proceed with this intervention was based on the information collected from the patient. The Data and conclusions were drawn from the patient's questionnaire, the interview, and the physical examination. Because the information was provided in large part by the patient, it cannot be guaranteed that it has not been purposely or unconsciously manipulated. Every effort has been made to obtain as much relevant data as possible for this evaluation. It is important to note that the conclusions that lead to this procedure are derived in large part from the available data. Always take into account that the treatment will also be dependent on availability of resources and existing treatment guidelines, considered by other Pain Management Practitioners as being common knowledge and practice, at the time of the intervention. For Medico-Legal purposes, it is also important to point out that variation in procedural techniques and pharmacological choices are the acceptable norm. The indications, contraindications, technique, and results of the above procedure should only be interpreted and  judged by a Board-Certified Interventional Pain Specialist with extensive familiarity and expertise in the same exact procedure and technique.

## 2020-02-19 NOTE — Progress Notes (Signed)
Safety precautions to be maintained throughout the outpatient stay will include: orient to surroundings, keep bed in low position, maintain call bell within reach at all times, provide assistance with transfer out of bed and ambulation.  

## 2020-02-20 ENCOUNTER — Telehealth: Payer: Self-pay | Admitting: *Deleted

## 2020-02-20 NOTE — Telephone Encounter (Signed)
No problems post procedure. 

## 2020-02-26 ENCOUNTER — Other Ambulatory Visit: Payer: Self-pay

## 2020-02-26 ENCOUNTER — Telehealth (INDEPENDENT_AMBULATORY_CARE_PROVIDER_SITE_OTHER): Payer: Self-pay

## 2020-02-26 ENCOUNTER — Other Ambulatory Visit (INDEPENDENT_AMBULATORY_CARE_PROVIDER_SITE_OTHER): Payer: Self-pay | Admitting: Nurse Practitioner

## 2020-02-26 ENCOUNTER — Other Ambulatory Visit
Admission: RE | Admit: 2020-02-26 | Discharge: 2020-02-26 | Disposition: A | Discharge status: Home / Self Care | Payer: Medicare Other | Source: Ambulatory Visit | Attending: Vascular Surgery | Admitting: Vascular Surgery

## 2020-02-26 DIAGNOSIS — Z20822 Contact with and (suspected) exposure to covid-19: Secondary | ICD-10-CM | POA: Diagnosis not present

## 2020-02-26 DIAGNOSIS — Z01812 Encounter for preprocedural laboratory examination: Secondary | ICD-10-CM | POA: Diagnosis present

## 2020-02-26 LAB — SARS CORONAVIRUS 2 (TAT 6-24 HRS): SARS Coronavirus 2: NEGATIVE

## 2020-02-26 NOTE — Telephone Encounter (Signed)
A fax was received from [place] at [facility] about the patient having a left arm fistulagram. Patient is scheduled with Dr. Delana Meyer on 02/27/20 with a 3:00 pm arrival time to the [facility]. Patient will do his covid test today 02/26/20 before 1:00 pm at the [facility]. Pre-procedure instructions will be faxed to [facility] at [facility].

## 2020-02-27 ENCOUNTER — Other Ambulatory Visit: Payer: Self-pay

## 2020-02-27 ENCOUNTER — Encounter: Payer: Self-pay | Admitting: Vascular Surgery

## 2020-02-27 ENCOUNTER — Ambulatory Visit
Admission: RE | Admit: 2020-02-27 | Discharge: 2020-02-27 | Disposition: A | Discharge status: Home / Self Care | Payer: Medicare Other | Attending: Vascular Surgery | Admitting: Vascular Surgery

## 2020-02-27 ENCOUNTER — Encounter
Admission: RE | Disposition: A | Discharge status: Home / Self Care | Payer: Self-pay | Source: Home / Self Care | Attending: Vascular Surgery

## 2020-02-27 DIAGNOSIS — Z888 Allergy status to other drugs, medicaments and biological substances status: Secondary | ICD-10-CM | POA: Insufficient documentation

## 2020-02-27 DIAGNOSIS — M170 Bilateral primary osteoarthritis of knee: Secondary | ICD-10-CM | POA: Diagnosis not present

## 2020-02-27 DIAGNOSIS — Z8249 Family history of ischemic heart disease and other diseases of the circulatory system: Secondary | ICD-10-CM | POA: Insufficient documentation

## 2020-02-27 DIAGNOSIS — N186 End stage renal disease: Secondary | ICD-10-CM | POA: Insufficient documentation

## 2020-02-27 DIAGNOSIS — Z6835 Body mass index (BMI) 35.0-35.9, adult: Secondary | ICD-10-CM | POA: Diagnosis not present

## 2020-02-27 DIAGNOSIS — Y832 Surgical operation with anastomosis, bypass or graft as the cause of abnormal reaction of the patient, or of later complication, without mention of misadventure at the time of the procedure: Secondary | ICD-10-CM | POA: Diagnosis not present

## 2020-02-27 DIAGNOSIS — Z79899 Other long term (current) drug therapy: Secondary | ICD-10-CM | POA: Diagnosis not present

## 2020-02-27 DIAGNOSIS — T82858A Stenosis of vascular prosthetic devices, implants and grafts, initial encounter: Secondary | ICD-10-CM | POA: Insufficient documentation

## 2020-02-27 DIAGNOSIS — T82898A Other specified complication of vascular prosthetic devices, implants and grafts, initial encounter: Secondary | ICD-10-CM | POA: Diagnosis not present

## 2020-02-27 DIAGNOSIS — I120 Hypertensive chronic kidney disease with stage 5 chronic kidney disease or end stage renal disease: Secondary | ICD-10-CM | POA: Diagnosis not present

## 2020-02-27 DIAGNOSIS — Z885 Allergy status to narcotic agent status: Secondary | ICD-10-CM | POA: Diagnosis not present

## 2020-02-27 DIAGNOSIS — F419 Anxiety disorder, unspecified: Secondary | ICD-10-CM | POA: Diagnosis not present

## 2020-02-27 DIAGNOSIS — Z992 Dependence on renal dialysis: Secondary | ICD-10-CM | POA: Diagnosis not present

## 2020-02-27 DIAGNOSIS — Z7982 Long term (current) use of aspirin: Secondary | ICD-10-CM | POA: Insufficient documentation

## 2020-02-27 DIAGNOSIS — E1122 Type 2 diabetes mellitus with diabetic chronic kidney disease: Secondary | ICD-10-CM | POA: Diagnosis not present

## 2020-02-27 HISTORY — PX: A/V FISTULAGRAM: CATH118298

## 2020-02-27 LAB — GLUCOSE, CAPILLARY
Glucose-Capillary: 90 mg/dL (ref 70–99)
Glucose-Capillary: 84 mg/dL (ref 70–99)

## 2020-02-27 LAB — POTASSIUM (ARMC VASCULAR LAB ONLY): Potassium (ARMC vascular lab): 4.7 (ref 3.5–5.1)

## 2020-02-27 SURGERY — A/V FISTULAGRAM
Anesthesia: Moderate Sedation | Laterality: Left

## 2020-02-27 MED ORDER — LABETALOL HCL 5 MG/ML IV SOLN
INTRAVENOUS | Status: AC
  Filled 2020-02-27: qty 4

## 2020-02-27 MED ORDER — HEPARIN SODIUM (PORCINE) 1000 UNIT/ML IJ SOLN
INTRAMUSCULAR | Status: AC
  Filled 2020-02-27: qty 1

## 2020-02-27 MED ORDER — FENTANYL CITRATE (PF) 100 MCG/2ML IJ SOLN
INTRAMUSCULAR | Status: DC | PRN
  Administered 2020-02-27: 50 ug via INTRAVENOUS

## 2020-02-27 MED ORDER — HEPARIN SODIUM (PORCINE) 1000 UNIT/ML IJ SOLN
INTRAMUSCULAR | Status: DC | PRN
  Administered 2020-02-27: 4000 [IU] via INTRAVENOUS

## 2020-02-27 MED ORDER — ONDANSETRON HCL 4 MG/2ML IJ SOLN: 4.0000 mg | Freq: Four times a day (QID) | INTRAMUSCULAR | Status: DC | PRN

## 2020-02-27 MED ORDER — FENTANYL CITRATE (PF) 100 MCG/2ML IJ SOLN
INTRAMUSCULAR | Status: AC
  Filled 2020-02-27: qty 2

## 2020-02-27 MED ORDER — METHYLPREDNISOLONE SODIUM SUCC 125 MG IJ SOLR: 125.0000 mg | Freq: Once | INTRAMUSCULAR | Status: DC | PRN

## 2020-02-27 MED ORDER — SODIUM CHLORIDE 0.9 % IV SOLN: INTRAVENOUS | Status: DC

## 2020-02-27 MED ORDER — LABETALOL HCL 5 MG/ML IV SOLN
INTRAVENOUS | Status: DC | PRN
  Administered 2020-02-27 (×2): 10 mg via INTRAVENOUS

## 2020-02-27 MED ORDER — FAMOTIDINE 20 MG PO TABS: 40.0000 mg | ORAL_TABLET | Freq: Once | ORAL | Status: DC | PRN

## 2020-02-27 MED ORDER — MIDAZOLAM HCL 5 MG/5ML IJ SOLN
INTRAMUSCULAR | Status: AC
  Filled 2020-02-27: qty 5

## 2020-02-27 MED ORDER — HYDROMORPHONE HCL 1 MG/ML IJ SOLN: 1.0000 mg | Freq: Once | INTRAMUSCULAR | Status: DC | PRN

## 2020-02-27 MED ORDER — MIDAZOLAM HCL 2 MG/2ML IJ SOLN
INTRAMUSCULAR | Status: DC | PRN
  Administered 2020-02-27: 2 mg via INTRAVENOUS

## 2020-02-27 MED ORDER — DIPHENHYDRAMINE HCL 50 MG/ML IJ SOLN: 50.0000 mg | Freq: Once | INTRAMUSCULAR | Status: DC | PRN

## 2020-02-27 MED ORDER — CEFAZOLIN SODIUM-DEXTROSE 1-4 GM/50ML-% IV SOLN
1.0000 g | Freq: Once | INTRAVENOUS | Status: AC
  Administered 2020-02-27: 1 g via INTRAVENOUS

## 2020-02-27 MED ORDER — MIDAZOLAM HCL 2 MG/ML PO SYRP: 8.0000 mg | ORAL_SOLUTION | Freq: Once | ORAL | Status: DC | PRN

## 2020-02-27 SURGICAL SUPPLY — 15 items
BALLN DORADO 6X80X80 (BALLOONS) ×3
BALLN DORADO 8X60X80 (BALLOONS) ×3
BALLOON DORADO 6X80X80 (BALLOONS) ×1 IMPLANT
BALLOON DORADO 8X60X80 (BALLOONS) ×1 IMPLANT
CATH BEACON 5 .035 65 KMP TIP (CATHETERS) ×3 IMPLANT
DEVICE PRESTO INFLATION (MISCELLANEOUS) ×3 IMPLANT
GUIDEWIRE ANGLED .035 180CM (WIRE) ×3 IMPLANT
NEEDLE ENTRY 21GA 7CM ECHOTIP (NEEDLE) ×3 IMPLANT
PACK ANGIOGRAPHY (CUSTOM PROCEDURE TRAY) ×3 IMPLANT
SET INTRO CAPELLA COAXIAL (SET/KITS/TRAYS/PACK) ×3 IMPLANT
SHEATH BRITE TIP 6FRX5.5 (SHEATH) ×3 IMPLANT
SHEATH BRITE TIP 7FRX5.5 (SHEATH) ×3 IMPLANT
STENT VIABAHN 8X7.5X120 (Permanent Stent) ×3 IMPLANT
SUT MNCRL AB 4-0 PS2 18 (SUTURE) ×3 IMPLANT
WIRE G V18X300CM (WIRE) ×3 IMPLANT

## 2020-02-27 NOTE — Op Note (Signed)
OPERATIVE NOTE   PROCEDURE: 1. Contrast injection left arm brachiocephalic AV access 2. Percutaneous transluminal angioplasty and stent placement peripheral segment brachiocephalic fistula  PRE-OPERATIVE DIAGNOSIS: Complication of dialysis access                                                       End Stage Renal Disease  POST-OPERATIVE DIAGNOSIS: same as above   SURGEON: Katha Cabal, M.D.  ANESTHESIA: Conscious sedation was administered under my direct supervision by the interventional radiology RN. IV Versed plus fentanyl were utilized. Continuous ECG, pulse oximetry and blood pressure was monitored throughout the entire procedure.  Conscious sedation was for a total of 35 minutes.  ESTIMATED BLOOD LOSS: minimal  FINDING(S): 1. 2 tandem greater than 90% stenoses in the midportion of the fistula associated with extensive tributaries.  SPECIMEN(S):  None  CONTRAST: 25 cc  FLUOROSCOPY TIME: 5.3 minutes  INDICATIONS: Steve Vance is a 59 y.o. male who  presents with malfunctioning left arm brachiocephalic AV access.  The patient is scheduled for angiography with possible intervention of the AV access.  The patient is aware the risks include but are not limited to: bleeding, infection, thrombosis of the cannulated access, and possible anaphylactic reaction to the contrast.  The patient acknowledges if the access can not be salvaged a tunneled catheter will be needed and will be placed during this procedure.  The patient is aware of the risks of the procedure and elects to proceed with the angiogram and intervention.  DESCRIPTION: After full informed written consent was obtained, the patient was brought back to the Special Procedure suite and placed supine position.  Appropriate cardiopulmonary monitors were placed.  The left arm was prepped and draped in the standard fashion.  Appropriate timeout is called. The left brachiocephalic fistula was cannulated with a micropuncture  needle.  Cannulation was performed with ultrasound guidance. Ultrasound was placed in a sterile sleeve, the AV access was interrogated and noted to be echolucent and compressible indicating patency. Image was recorded for the permanent record. The puncture is performed under continuous ultrasound visualization.   The microwire was advanced and the needle was exchanged for  a microsheath.  The J-wire was then advanced and a 6 Fr sheath inserted.  Hand injections were completed to image the access from the arterial anastomosis through the entire access.  The central venous structures were also imaged by hand injections.  Based on the images,  4000 units of heparin was given and a wire was negotiated through the strictures within the venous portion of the graft.  6 mm x 80 mm Dorado balloon was then advanced across the tandem lesions and inflated to 14 atm for 1 minute.  Follow-up imaging demonstrated greater than 70% residual stenosis.  An 8 mm x 75 mm Viabahn was deployed across the stenoses and postdilated with an 8 mm Dorado balloon inflated to 28 atm for 30 seconds.  Follow-up imaging demonstrates complete resolution of the stricture with less than 10% residual stenosis and rapid flow of contrast through the graft, the central venous anatomy is preserved and free of any hemodynamic stenosis..  A 4-0 Monocryl purse-string suture was sewn around the sheath.  The sheath was removed and light pressure was applied.  A sterile bandage was applied to the puncture site.    COMPLICATIONS: None  CONDITION: Steve Vance, M.D [facility] Office: [phone]  02/27/2020 6:03 PM

## 2020-02-27 NOTE — Progress Notes (Signed)
Dr. Delana Meyer at bedside speaking with pt. And significant other [place] re: procedural results. Both verbalized understanding of conversation.

## 2020-02-28 ENCOUNTER — Encounter: Payer: Self-pay | Admitting: Vascular Surgery

## 2020-02-29 NOTE — H&P (Signed)
[facility] History & Physical Update  The patient was interviewed and re-examined.  The patient's previous History and Physical has been reviewed and is unchanged.  There is no change in the plan of care. We plan to proceed with the scheduled procedure.  [name], MD  02/29/2020, 4:36 PM

## 2020-03-03 ENCOUNTER — Other Ambulatory Visit: Payer: Self-pay

## 2020-03-03 ENCOUNTER — Ambulatory Visit: Payer: Medicare Other | Attending: Pain Medicine | Admitting: Pain Medicine

## 2020-03-03 DIAGNOSIS — N186 End stage renal disease: Secondary | ICD-10-CM

## 2020-03-03 DIAGNOSIS — M79605 Pain in left leg: Secondary | ICD-10-CM

## 2020-03-03 DIAGNOSIS — M48062 Spinal stenosis, lumbar region with neurogenic claudication: Secondary | ICD-10-CM

## 2020-03-03 DIAGNOSIS — M545 Low back pain: Secondary | ICD-10-CM

## 2020-03-03 DIAGNOSIS — Z992 Dependence on renal dialysis: Secondary | ICD-10-CM

## 2020-03-03 DIAGNOSIS — M5137 Other intervertebral disc degeneration, lumbosacral region: Secondary | ICD-10-CM

## 2020-03-03 DIAGNOSIS — G8929 Other chronic pain: Secondary | ICD-10-CM

## 2020-03-03 DIAGNOSIS — M79604 Pain in right leg: Secondary | ICD-10-CM | POA: Diagnosis not present
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
## 2020-03-04 ENCOUNTER — Ambulatory Visit: Payer: Medicare Other | Admitting: Pain Medicine

## 2020-03-11 ENCOUNTER — Ambulatory Visit (HOSPITAL_BASED_OUTPATIENT_CLINIC_OR_DEPARTMENT_OTHER): Payer: Medicare Other | Admitting: Pain Medicine

## 2020-03-11 ENCOUNTER — Other Ambulatory Visit: Payer: Self-pay

## 2020-03-11 ENCOUNTER — Encounter: Payer: Self-pay | Admitting: Pain Medicine

## 2020-03-11 ENCOUNTER — Ambulatory Visit
Admission: RE | Admit: 2020-03-11 | Discharge: 2020-03-11 | Disposition: A | Discharge status: Home / Self Care | Payer: Medicare Other | Source: Ambulatory Visit | Attending: Pain Medicine | Admitting: Pain Medicine

## 2020-03-11 VITALS — BP 165/80 | HR 83 | Temp 98.2°F | Resp 17 | Ht 71.0 in | Wt 240.0 lb

## 2020-03-11 DIAGNOSIS — M48062 Spinal stenosis, lumbar region with neurogenic claudication: Secondary | ICD-10-CM | POA: Insufficient documentation

## 2020-03-11 DIAGNOSIS — M545 Low back pain, unspecified: Secondary | ICD-10-CM

## 2020-03-11 DIAGNOSIS — M4807 Spinal stenosis, lumbosacral region: Secondary | ICD-10-CM | POA: Diagnosis present

## 2020-03-11 DIAGNOSIS — M5137 Other intervertebral disc degeneration, lumbosacral region: Secondary | ICD-10-CM | POA: Insufficient documentation

## 2020-03-11 DIAGNOSIS — G8929 Other chronic pain: Secondary | ICD-10-CM | POA: Diagnosis present

## 2020-03-11 MED ORDER — SODIUM CHLORIDE (PF) 0.9 % IJ SOLN
INTRAMUSCULAR | Status: AC
  Filled 2020-03-11: qty 10

## 2020-03-11 MED ORDER — TRIAMCINOLONE ACETONIDE 40 MG/ML IJ SUSP
40.0000 mg | Freq: Once | INTRAMUSCULAR | Status: AC
  Administered 2020-03-11: 40 mg
  Filled 2020-03-11: qty 1

## 2020-03-11 MED ORDER — LACTATED RINGERS IV SOLN
1000.0000 mL | Freq: Once | INTRAVENOUS | Status: AC
  Administered 2020-03-11: 1000 mL via INTRAVENOUS

## 2020-03-11 MED ORDER — ROPIVACAINE HCL 2 MG/ML IJ SOLN
2.0000 mL | Freq: Once | INTRAMUSCULAR | Status: AC
  Administered 2020-03-11: 2 mL via EPIDURAL
  Filled 2020-03-11: qty 10

## 2020-03-11 MED ORDER — MIDAZOLAM HCL 5 MG/5ML IJ SOLN
1.0000 mg | INTRAMUSCULAR | Status: DC | PRN
  Administered 2020-03-11: 1 mg via INTRAVENOUS
  Filled 2020-03-11: qty 5

## 2020-03-11 MED ORDER — SODIUM CHLORIDE 0.9% FLUSH
2.0000 mL | Freq: Once | INTRAVENOUS | Status: AC
  Administered 2020-03-11: 2 mL

## 2020-03-11 MED ORDER — FENTANYL CITRATE (PF) 100 MCG/2ML IJ SOLN
25.0000 ug | INTRAMUSCULAR | Status: DC | PRN
  Administered 2020-03-11: 50 ug via INTRAVENOUS
  Filled 2020-03-11: qty 2

## 2020-03-11 MED ORDER — LIDOCAINE HCL 2 % IJ SOLN
20.0000 mL | Freq: Once | INTRAMUSCULAR | Status: AC
  Administered 2020-03-11: 400 mg
  Filled 2020-03-11: qty 20

## 2020-03-11 MED ORDER — IOHEXOL 180 MG/ML  SOLN
10.0000 mL | Freq: Once | INTRAMUSCULAR | Status: AC
  Administered 2020-03-11: 2 mL via EPIDURAL
  Filled 2020-03-11: qty 20

## 2020-03-11 NOTE — Progress Notes (Signed)
Safety precautions to be maintained throughout the outpatient stay will include: orient to surroundings, keep bed in low position, maintain call bell within reach at all times, provide assistance with transfer out of bed and ambulation.  

## 2020-03-11 NOTE — Patient Instructions (Signed)

## 2020-03-11 NOTE — Progress Notes (Signed)
PROVIDER NOTE: Information contained herein reflects review and annotations entered in association with encounter. Interpretation of such information and data should be left to medically-trained personnel. Information provided to patient can be located elsewhere in the medical record under "Patient Instructions". Document created using STT-dictation technology, any transcriptional errors that may result from process are unintentional.    Patient: Steve Vance  Service Category: Procedure  Provider: Gaspar Cola, MD  DOB: 1961-08-03  DOS: 03/11/2020  Location: [facility]  MRN: [id number]  Setting: Ambulatory - outpatient  Referring Provider: Theotis Burrow*  Type: Established Patient  Specialty: Interventional Pain Management  PCP: Theotis Burrow, MD   Primary Reason for Visit: Interventional Pain Management Treatment. CC: Back Pain (low)  Procedure:          Anesthesia, Analgesia, Anxiolysis:  Type: Therapeutic Inter-Laminar Epidural Steroid Injection  #3  Region: Lumbar Level: L2-3 Level. Laterality: Midline         Type: Moderate (Conscious) Sedation combined with Local Anesthesia Indication(s): Analgesia and Anxiety Route: Intravenous (IV) IV Access: Secured Sedation: Meaningful verbal contact was maintained at all times during the procedure  Local Anesthetic: Lidocaine 1-2%  Position: Prone with head of the table was raised to facilitate breathing.   Indications: 1. DDD (degenerative disc disease), lumbosacral   2. 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 Chronic low back pain (1ry area of Pain) (Bilateral) (R=L)    Pain Score: Pre-procedure: 7 /10 Post-procedure: 0-No pain/10   Note: The patient came into the clinics today indicating that he wanted to change this procedure to the lumbar facet radiofrequency ablation.  However, I have explained  to the patient at least 5 different times that based on the diagnostic lumbar facet blocks, he is not a candidate for radiofrequency ablation.  At the very beginning of his therapy, I went over the plan of care and possible alternatives.  I mention to him that there were 2 possible causes for his low back pain.  One could be the lumbar facets and the other a radiculitis coming from the L2-3 region.  At the time, I explained to him that we would be starting with the diagnostic lumbar facet blocks and if they did prove to be responsible for his pain, then he perhaps may be a candidate for radiofrequency ablation, procedure that has the potential of providing him with anywhere from 29month to 18 months of pain relief.  From all of this, apparently the only thing that stayed with him was that there was a procedure with the potential of giving him relief of his pain for a full year.  However, I was very clear to him that if the diagnostic lumbar facet blocks did not confirm that his primary pain was the apophyseal joints, then we would need to move onto the L2-3 region.  After having done the diagnostic lumbar facet blocks, it was clear to me that the lumbar facets are not primarily responsible for his low back pain.  Subsequent L2-3 LESI treatments have proven to be a great deal more effective in controlling his pain than the facet blocks.  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  Today he comes into the clinics indicating that he wants to change the procedure that we have schedule for today to the one that gives him relief of the pain for a year (referring to the lumbar facet radiofrequency  ablation).  In fact, he called a family member over the phone and had her listen to our conversation and the explanation on why we were not going to do this radiofrequency.  I was very clear to both of them that at the very beginning we had a differential diagnosis of this pain coming either from the lumbar  facets or the intraspinal pathology.  Over the course of the diagnostic blocks we have learned that the pathology is intraspinal and therefore radiofrequency is not indicated since it is for the facets.  At this point, I reminded him that today he was scheduled to have his third and last L2-3 LESI.  I also reminded him again that should this not work, the next step would be to get a referral to a neurosurgeon for possible decompression.  At this point patient became rather aggressive and raised his voice at me that he would under no circumstances have a surgery for his back pain.  He also said that he wanted me to do the radiofrequency ablation, no matter what.  I allowed the patient to calm down and then his family member interceded and also told him to come down, which he did.  At this point I made it very clear to him that I would not be doing any type of procedure that is not indicated.  He asked about his options and basically I told him that this was as much as I was willing to go for and that I really thought he needed to consider the options that I had given him.  I also made it very clear that should he choose not to have the referral, one of his likely alternatives was to continue with this pain for the rest of his life.  They asked me if I would be willing to refer him to another pain clinic and I told him that I would if that is what they wanted.  At this point, I will have anything else that I can offer him.  Pre-op Assessment:  [name] is a 59 y.o. (year old), male patient, seen today for interventional treatment. He  has a past surgical history that includes Left Shoulder Surgery; DIALYSIS/PERMA CATHETER INSERTION (N/A, 04/02/2019); AV fistula placement (Left, 06/27/2019); DIALYSIS/PERMA CATHETER REMOVAL (N/A, 10/03/2019); Vascular surgery; and A/V Fistulagram (Left, 02/27/2020). [name] has a current medication list which includes the following prescription(s): accu-chek fastclix lancets,  amlodipine, aspirin, atorvastatin, blood glucose meter kit and supplies, calcium acetate, cyclobenzaprine, hydrocodone-acetaminophen, labetalol, losartan, and sertraline, and the following Facility-Administered Medications: fentanyl and midazolam. His primarily concern today is the Back Pain (low)  Initial Vital Signs:  Pulse/HCG Rate: 83ECG Heart Rate: 87 Temp: 98 F (36.7 C) Resp: 18 BP: (!) 149/85 SpO2: 100 %  BMI: Estimated body mass index is 33.47 kg/m as calculated from the following:   Height as of this encounter: '5\' 11"'  (1.803 m).   Weight as of this encounter: 240 lb (108.9 kg).  Risk Assessment: Allergies: Reviewed. He is allergic to baclofen, gabapentin, and percocet [oxycodone-acetaminophen].  Allergy Precautions: None required Coagulopathies: Reviewed. None identified.  Blood-thinner therapy: None at this time Active Infection(s): Reviewed. None identified. Mr. Batterman is afebrile  Site Confirmation: Mr. Fehringer was asked to confirm the procedure and laterality before marking the site Procedure checklist: Completed Consent: Before the procedure and under the influence of no sedative(s), amnesic(s), or anxiolytics, the patient was informed of the treatment options, risks and possible complications. To fulfill our ethical and legal  obligations, as recommended by the American Medical Association's Code of Ethics, I have informed the patient of my clinical impression; the nature and purpose of the treatment or procedure; the risks, benefits, and possible complications of the intervention; the alternatives, including doing nothing; the risk(s) and benefit(s) of the alternative treatment(s) or procedure(s); and the risk(s) and benefit(s) of doing nothing. The patient was provided information about the general risks and possible complications associated with the procedure. These may include, but are not limited to: failure to achieve desired goals, infection, bleeding, organ or nerve  damage, allergic reactions, paralysis, and death. In addition, the patient was informed of those risks and complications associated to Spine-related procedures, such as failure to decrease pain; infection (i.e.: Meningitis, epidural or intraspinal abscess); bleeding (i.e.: epidural hematoma, subarachnoid hemorrhage, or any other type of intraspinal or peri-dural bleeding); organ or nerve damage (i.e.: Any type of peripheral nerve, nerve root, or spinal cord injury) with subsequent damage to sensory, motor, and/or autonomic systems, resulting in permanent pain, numbness, and/or weakness of one or several areas of the body; allergic reactions; (i.e.: anaphylactic reaction); and/or death. Furthermore, the patient was informed of those risks and complications associated with the medications. These include, but are not limited to: allergic reactions (i.e.: anaphylactic or anaphylactoid reaction(s)); adrenal axis suppression; blood sugar elevation that in diabetics may result in ketoacidosis or comma; water retention that in patients with history of congestive heart failure may result in shortness of breath, pulmonary edema, and decompensation with resultant heart failure; weight gain; swelling or edema; medication-induced neural toxicity; particulate matter embolism and blood vessel occlusion with resultant organ, and/or nervous system infarction; and/or aseptic necrosis of one or more joints. Finally, the patient was informed that Medicine is not an exact science; therefore, there is also the possibility of unforeseen or unpredictable risks and/or possible complications that may result in a catastrophic outcome. The patient indicated having understood very clearly. We have given the patient no guarantees and we have made no promises. Enough time was given to the patient to ask questions, all of which were answered to the patient's satisfaction. Mr. Geisen has indicated that he wanted to continue with the  procedure. Attestation: I, the ordering provider, attest that I have discussed with the patient the benefits, risks, side-effects, alternatives, likelihood of achieving goals, and potential problems during recovery for the procedure that I have provided informed consent. Date  Time: 03/11/2020  9:03 AM  Pre-Procedure Preparation:  Monitoring: As per clinic protocol. Respiration, ETCO2, SpO2, BP, heart rate and rhythm monitor placed and checked for adequate function Safety Precautions: Patient was assessed for positional comfort and pressure points before starting the procedure. Time-out: I initiated and conducted the "Time-out" before starting the procedure, as per protocol. The patient was asked to participate by confirming the accuracy of the "Time Out" information. Verification of the correct person, site, and procedure were performed and confirmed by me, the nursing staff, and the patient. "Time-out" conducted as per Joint Commission's Universal Protocol (UP.01.01.01). Time: 1042  Description of Procedure:          Target Area: The interlaminar space, initially targeting the lower laminar border of the superior vertebral body. Approach: Paramedial approach. Area Prepped: Entire Posterior Lumbar Region DuraPrep (Iodine Povacrylex [0.7% available iodine] and Isopropyl Alcohol, 74% w/w) Safety Precautions: Aspiration looking for blood return was conducted prior to all injections. At no point did we inject any substances, as a needle was being advanced. No attempts were made at seeking any  paresthesias. Safe injection practices and needle disposal techniques used. Medications properly checked for expiration dates. SDV (single dose vial) medications used. Description of the Procedure: Protocol guidelines were followed. The procedure needle was introduced through the skin, ipsilateral to the reported pain, and advanced to the target area. Bone was contacted and the needle walked caudad, until the lamina  was cleared. The epidural space was identified using "loss-of-resistance technique" with 2-3 ml of PF-NaCl (0.9% NSS), in a 5cc LOR glass syringe.  Vitals:   03/11/20 1048 03/11/20 1058 03/11/20 1108 03/11/20 1119  BP: (!) 157/93 (!) 166/82 (!) 167/82 (!) 165/80  Pulse:      Resp: '16 18 17 17  ' Temp:  98.4 F (36.9 C)  98.2 F (36.8 C)  SpO2: 100% 100% 100% 100%  Weight:      Height:        Start Time: 1042 hrs. End Time: 1048 hrs.  Materials:  Needle(s) Type: Epidural needle Gauge: 17G Length: 3.5-in Medication(s): Please see orders for medications and dosing details.  Imaging Guidance (Spinal):          Type of Imaging Technique: Fluoroscopy Guidance (Spinal) Indication(s): Assistance in needle guidance and placement for procedures requiring needle placement in or near specific anatomical locations not easily accessible without such assistance. Exposure Time: Please see nurses notes. Contrast: Before injecting any contrast, we confirmed that the patient did not have an allergy to iodine, shellfish, or radiological contrast. Once satisfactory needle placement was completed at the desired level, radiological contrast was injected. Contrast injected under live fluoroscopy. No contrast complications. See chart for type and volume of contrast used. Fluoroscopic Guidance: I was personally present during the use of fluoroscopy. "Tunnel Vision Technique" used to obtain the best possible view of the target area. Parallax error corrected before commencing the procedure. "Direction-depth-direction" technique used to introduce the needle under continuous pulsed fluoroscopy. Once target was reached, antero-posterior, oblique, and lateral fluoroscopic projection used confirm needle placement in all planes. Images permanently stored in EMR. Interpretation: I personally interpreted the imaging intraoperatively. Adequate needle placement confirmed in multiple planes. Appropriate spread of contrast into  desired area was observed. No evidence of afferent or efferent intravascular uptake. No intrathecal or subarachnoid spread observed. Permanent images saved into the patient's record.  Antibiotic Prophylaxis:   Anti-infectives (From admission, onward)   None     Indication(s): None identified  Post-operative Assessment:  Post-procedure Vital Signs:  Pulse/HCG Rate: 8375 Temp: 98.2 F (36.8 C) Resp: 17 BP: (!) 165/80 SpO2: 100 %  EBL: None  Complications: No immediate post-treatment complications observed by team, or reported by patient.  Note: The patient tolerated the entire procedure well. A repeat set of vitals were taken after the procedure and the patient was kept under observation following institutional policy, for this type of procedure. Post-procedural neurological assessment was performed, showing return to baseline, prior to discharge. The patient was provided with post-procedure discharge instructions, including a section on how to identify potential problems. Should any problems arise concerning this procedure, the patient was given instructions to immediately contact us, at any time, without hesitation. In any case, we plan to contact the patient by telephone for a follow-up status report regarding this interventional procedure.  Comments:  No additional relevant information.  Plan of Care  Orders:  Orders Placed This Encounter  Procedures  . Lumbar Epidural Injection    Scheduling Instructions:     Procedure: Interlaminar LESI L2-3     Laterality: Midline  Sedation: Patient's choice     Timeframe:  Today    Order Specific Question:   Where will this procedure be performed?    Answer:   [facility]  . DG PAIN [facility] C-ARM 1-60 MIN NO REPORT    Intraoperative interpretation by procedural physician at [place].    Standing Status:   Standing    Number of Occurrences:   1    Order Specific Question:   Reason for exam:    Answer:    Assistance in needle guidance and placement for procedures requiring needle placement in or near specific anatomical locations not easily accessible without such assistance.  . Ambulatory referral to Pain Clinic    Referral Priority:   Routine    Referral Type:   Consultation    Referral Reason:   Specialty Services Required    Requested Specialty:   Pain Medicine    Number of Visits Requested:   1  . Informed Consent Details: Physician/Practitioner Attestation; Transcribe to consent form and obtain patient signature    Provider Attestation: I, [place] Dossie Arbour, MD, (Pain Management Specialist), the physician/practitioner, attest that I have discussed with the patient the benefits, risks, side effects, alternatives, likelihood of achieving goals and potential problems during recovery for the procedure that I have provided informed consent.    Scheduling Instructions:     Procedure: Lumbar epidural steroid injection under fluoroscopic guidance     Indications: Low back and/or lower extremity pain secondary to lumbar radiculitis     Note: Always confirm laterality of pain with Mr. Galindo, before procedure.     Transcribe to consent form and obtain patient signature.  . Provide equipment / supplies at bedside    Equipment required: Single use, disposable, "Epidural Tray" Epidural Catheter: NOT required    Standing Status:   Standing    Number of Occurrences:   1    Order Specific Question:   Specify    Answer:   Epidural Tray   Chronic Opioid Analgesic:  No opioid analgesics prescribed by our practice. Highest recorded MME/day: 35 mg/day MME/day: 0 mg/day   Medications ordered for procedure: Meds ordered this encounter  Medications  . lidocaine (XYLOCAINE) 2 % (with pres) injection 400 mg  . iohexol (OMNIPAQUE) 180 MG/ML injection 10 mL    Must be Myelogram-compatible. If not available, you may substitute with a water-soluble, non-ionic, hypoallergenic, myelogram-compatible  radiological contrast medium.  Marland Kitchen lactated ringers infusion 1,000 mL  . midazolam (VERSED) 5 MG/5ML injection 1-2 mg    Make sure Flumazenil is available in the pyxis when using this medication. If oversedation occurs, administer 0.2 mg IV over 15 sec. If after 45 sec no response, administer 0.2 mg again over 1 min; may repeat at 1 min intervals; not to exceed 4 doses (1 mg)  . fentaNYL (SUBLIMAZE) injection 25-50 mcg    Make sure Narcan is available in the pyxis when using this medication. In the event of respiratory depression (RR< 8/min): Titrate NARCAN (naloxone) in increments of 0.1 to 0.2 mg IV at 2-3 minute intervals, until desired degree of reversal.  . sodium chloride flush (NS) 0.9 % injection 2 mL  . ropivacaine (PF) 2 mg/mL (0.2%) (NAROPIN) injection 2 mL  . triamcinolone acetonide (KENALOG-40) injection 40 mg   Medications administered: We administered lidocaine, iohexol, lactated ringers, midazolam, fentaNYL, sodium chloride flush, ropivacaine (PF) 2 mg/mL (0.2%), and triamcinolone acetonide.  See the medical record for exact dosing, route, and time of administration.  Follow-up plan:   Return in about 2 weeks (around 03/25/2020) for VV(15-min), (PP).       Interventional treatment options: Planned, scheduled, and/or pending:   No other procedures. Referral to the [facility]   Under consideration:   none   Therapeutic/palliative (PRN):   Diagnostic bilateral lumbar facet block (100/100/50/50)(75/75/75/0) Diagnostic midline L2-3 LESI  (20/20/0/75)(100/100/100)    Recent Visits Date Type Provider Dept  03/03/20 Telemedicine Milinda Pointer, MD [facility]  02/19/20 Procedure visit Milinda Pointer, MD [facility]  02/13/20 Telemedicine Milinda Pointer, MD [facility]  01/29/20 Procedure visit Milinda Pointer, MD [facility]  01/01/20 Telemedicine Milinda Pointer, MD [facility]  Showing recent visits  within past 90 days and meeting all other requirements Today's Visits Date Type Provider Dept  03/11/20 Procedure visit Milinda Pointer, MD [facility]  Showing today's visits and meeting all other requirements Future Appointments Date Type Provider Dept  03/25/20 Appointment Milinda Pointer, MD [facility]  Showing future appointments within next 90 days and meeting all other requirements  Disposition: Discharge home  Discharge (Date  Time): 03/11/2020; 1120 hrs.   Primary Care Physician: Theotis Burrow, MD Location: [facility] Note by: Gaspar Cola, MD Date: 03/11/2020; Time: 12:51 PM  Disclaimer:  Medicine is not an Chief Strategy Officer. The only guarantee in medicine is that nothing is guaranteed. It is important to note that the decision to proceed with this intervention was based on the information collected from the patient. The Data and conclusions were drawn from the patient's questionnaire, the interview, and the physical examination. Because the information was provided in large part by the patient, it cannot be guaranteed that it has not been purposely or unconsciously manipulated. Every effort has been made to obtain as much relevant data as possible for this evaluation. It is important to note that the conclusions that lead to this procedure are derived in large part from the available data. Always take into account that the treatment will also be dependent on availability of resources and existing treatment guidelines, considered by other Pain Management Practitioners as being common knowledge and practice, at the time of the intervention. For Medico-Legal purposes, it is also important to point out that variation in procedural techniques and pharmacological choices are the acceptable norm. The indications, contraindications, technique, and results of the above procedure should only be interpreted and judged by a  Board-Certified Interventional Pain Specialist with extensive familiarity and expertise in the same exact procedure and technique.

## 2020-03-12 ENCOUNTER — Telehealth: Payer: Self-pay | Admitting: *Deleted

## 2020-03-12 NOTE — Telephone Encounter (Signed)
No problems post procedure. 

## 2020-03-24 ENCOUNTER — Encounter: Payer: Self-pay | Admitting: Pain Medicine

## 2020-03-25 ENCOUNTER — Ambulatory Visit: Payer: Medicare Other | Attending: Pain Medicine | Admitting: Pain Medicine

## 2020-03-25 ENCOUNTER — Other Ambulatory Visit: Payer: Self-pay

## 2020-03-25 DIAGNOSIS — R937 Abnormal findings on diagnostic imaging of other parts of musculoskeletal system: Secondary | ICD-10-CM

## 2020-03-25 DIAGNOSIS — G894 Chronic pain syndrome: Secondary | ICD-10-CM | POA: Diagnosis not present

## 2020-03-25 DIAGNOSIS — M48062 Spinal stenosis, lumbar region with neurogenic claudication: Secondary | ICD-10-CM

## 2020-03-25 DIAGNOSIS — M5030 Other cervical disc degeneration, unspecified cervical region: Secondary | ICD-10-CM

## 2020-03-25 DIAGNOSIS — M79604 Pain in right leg: Secondary | ICD-10-CM | POA: Diagnosis not present

## 2020-03-25 DIAGNOSIS — M542 Cervicalgia: Secondary | ICD-10-CM

## 2020-03-25 DIAGNOSIS — M5137 Other intervertebral disc degeneration, lumbosacral region: Secondary | ICD-10-CM

## 2020-03-25 DIAGNOSIS — M4310 Spondylolisthesis, site unspecified: Secondary | ICD-10-CM

## 2020-03-25 DIAGNOSIS — M4802 Spinal stenosis, cervical region: Secondary | ICD-10-CM

## 2020-03-25 DIAGNOSIS — G8929 Other chronic pain: Secondary | ICD-10-CM

## 2020-03-25 DIAGNOSIS — M47816 Spondylosis without myelopathy or radiculopathy, lumbar region: Secondary | ICD-10-CM

## 2020-03-25 DIAGNOSIS — M545 Low back pain, unspecified: Secondary | ICD-10-CM

## 2020-03-25 DIAGNOSIS — M7918 Myalgia, other site: Secondary | ICD-10-CM

## 2020-03-25 DIAGNOSIS — M4807 Spinal stenosis, lumbosacral region: Secondary | ICD-10-CM

## 2020-03-25 DIAGNOSIS — M47812 Spondylosis without myelopathy or radiculopathy, cervical region: Secondary | ICD-10-CM

## 2020-03-25 DIAGNOSIS — M79605 Pain in left leg: Secondary | ICD-10-CM

## 2020-03-25 DIAGNOSIS — Z87828 Personal history of other (healed) physical injury and trauma: Secondary | ICD-10-CM

## 2020-03-25 DIAGNOSIS — M25561 Pain in right knee: Secondary | ICD-10-CM
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
## 2020-03-31 ENCOUNTER — Telehealth: Payer: Self-pay | Admitting: Pain Medicine

## 2020-03-31 NOTE — Telephone Encounter (Signed)
Call pt and let him know that Rozetta Nunnery did send a referral to a pain clinic in [place]

## 2020-03-31 NOTE — Telephone Encounter (Signed)
The order for the referral is in. Can you check to make sure it was done?

## 2020-03-31 NOTE — Telephone Encounter (Signed)
Pt came into the office and stated that Dr Delane Ginger was supposed to refer him to a different pain management dr for pain medication because the injections are not helping and he wants medication instead. He states he hasn't heard anything from anyone about a referral.

## 2020-03-31 NOTE — Telephone Encounter (Signed)
Patient notified

## 2020-04-16 ENCOUNTER — Telehealth: Payer: Self-pay | Admitting: Pain Medicine

## 2020-04-16 NOTE — Telephone Encounter (Signed)
Returned the patients phone call to see what he needed.  Voicemail left.

## 2020-04-16 NOTE — Telephone Encounter (Signed)
Left message that I was returning patients voicemail for [place] to call him back.

## 2020-08-18 ENCOUNTER — Encounter (INDEPENDENT_AMBULATORY_CARE_PROVIDER_SITE_OTHER): Payer: Medicare Other

## 2020-08-18 ENCOUNTER — Ambulatory Visit (INDEPENDENT_AMBULATORY_CARE_PROVIDER_SITE_OTHER): Payer: Medicare Other | Admitting: Vascular Surgery

## 2020-08-25 ENCOUNTER — Emergency Department: Payer: Medicare Other

## 2020-08-25 ENCOUNTER — Other Ambulatory Visit: Payer: Self-pay

## 2020-08-25 ENCOUNTER — Inpatient Hospital Stay: Payer: Medicare Other

## 2020-08-25 ENCOUNTER — Inpatient Hospital Stay
Admission: EM | Admit: 2020-08-25 | Discharge: 2020-08-29 | DRG: 853 | Disposition: A | Discharge status: Home / Self Care | Payer: Medicare Other | Attending: Internal Medicine | Admitting: Internal Medicine

## 2020-08-25 ENCOUNTER — Encounter: Payer: Self-pay | Admitting: Emergency Medicine

## 2020-08-25 DIAGNOSIS — M549 Dorsalgia, unspecified: Secondary | ICD-10-CM | POA: Diagnosis present

## 2020-08-25 DIAGNOSIS — E669 Obesity, unspecified: Secondary | ICD-10-CM | POA: Diagnosis present

## 2020-08-25 DIAGNOSIS — I120 Hypertensive chronic kidney disease with stage 5 chronic kidney disease or end stage renal disease: Secondary | ICD-10-CM | POA: Diagnosis present

## 2020-08-25 DIAGNOSIS — Z7982 Long term (current) use of aspirin: Secondary | ICD-10-CM

## 2020-08-25 DIAGNOSIS — Z8249 Family history of ischemic heart disease and other diseases of the circulatory system: Secondary | ICD-10-CM

## 2020-08-25 DIAGNOSIS — N186 End stage renal disease: Secondary | ICD-10-CM | POA: Diagnosis present

## 2020-08-25 DIAGNOSIS — Z7984 Long term (current) use of oral hypoglycemic drugs: Secondary | ICD-10-CM

## 2020-08-25 DIAGNOSIS — M170 Bilateral primary osteoarthritis of knee: Secondary | ICD-10-CM | POA: Diagnosis present

## 2020-08-25 DIAGNOSIS — Z89421 Acquired absence of other right toe(s): Secondary | ICD-10-CM | POA: Diagnosis not present

## 2020-08-25 DIAGNOSIS — I10 Essential (primary) hypertension: Secondary | ICD-10-CM | POA: Diagnosis present

## 2020-08-25 DIAGNOSIS — E11621 Type 2 diabetes mellitus with foot ulcer: Secondary | ICD-10-CM | POA: Diagnosis present

## 2020-08-25 DIAGNOSIS — M868X7 Other osteomyelitis, ankle and foot: Secondary | ICD-10-CM | POA: Diagnosis present

## 2020-08-25 DIAGNOSIS — A419 Sepsis, unspecified organism: Secondary | ICD-10-CM | POA: Diagnosis not present

## 2020-08-25 DIAGNOSIS — L089 Local infection of the skin and subcutaneous tissue, unspecified: Secondary | ICD-10-CM | POA: Diagnosis not present

## 2020-08-25 DIAGNOSIS — F32A Depression, unspecified: Secondary | ICD-10-CM | POA: Diagnosis present

## 2020-08-25 DIAGNOSIS — E1165 Type 2 diabetes mellitus with hyperglycemia: Secondary | ICD-10-CM | POA: Diagnosis present

## 2020-08-25 DIAGNOSIS — F419 Anxiety disorder, unspecified: Secondary | ICD-10-CM | POA: Diagnosis present

## 2020-08-25 DIAGNOSIS — Z20822 Contact with and (suspected) exposure to covid-19: Secondary | ICD-10-CM | POA: Diagnosis present

## 2020-08-25 DIAGNOSIS — E1122 Type 2 diabetes mellitus with diabetic chronic kidney disease: Secondary | ICD-10-CM | POA: Diagnosis present

## 2020-08-25 DIAGNOSIS — R0602 Shortness of breath: Secondary | ICD-10-CM | POA: Diagnosis not present

## 2020-08-25 DIAGNOSIS — R6510 Systemic inflammatory response syndrome (SIRS) of non-infectious origin without acute organ dysfunction: Secondary | ICD-10-CM | POA: Diagnosis present

## 2020-08-25 DIAGNOSIS — E1169 Type 2 diabetes mellitus with other specified complication: Secondary | ICD-10-CM | POA: Diagnosis present

## 2020-08-25 DIAGNOSIS — Z888 Allergy status to other drugs, medicaments and biological substances status: Secondary | ICD-10-CM | POA: Diagnosis not present

## 2020-08-25 DIAGNOSIS — B182 Chronic viral hepatitis C: Secondary | ICD-10-CM | POA: Diagnosis not present

## 2020-08-25 DIAGNOSIS — M869 Osteomyelitis, unspecified: Secondary | ICD-10-CM | POA: Diagnosis present

## 2020-08-25 DIAGNOSIS — E1152 Type 2 diabetes mellitus with diabetic peripheral angiopathy with gangrene: Secondary | ICD-10-CM | POA: Diagnosis present

## 2020-08-25 DIAGNOSIS — Z992 Dependence on renal dialysis: Secondary | ICD-10-CM

## 2020-08-25 DIAGNOSIS — I96 Gangrene, not elsewhere classified: Secondary | ICD-10-CM | POA: Diagnosis not present

## 2020-08-25 DIAGNOSIS — A4102 Sepsis due to Methicillin resistant Staphylococcus aureus: Secondary | ICD-10-CM | POA: Diagnosis present

## 2020-08-25 DIAGNOSIS — M86171 Other acute osteomyelitis, right ankle and foot: Secondary | ICD-10-CM | POA: Diagnosis not present

## 2020-08-25 DIAGNOSIS — R7881 Bacteremia: Secondary | ICD-10-CM | POA: Diagnosis not present

## 2020-08-25 DIAGNOSIS — B9562 Methicillin resistant Staphylococcus aureus infection as the cause of diseases classified elsewhere: Secondary | ICD-10-CM | POA: Diagnosis not present

## 2020-08-25 DIAGNOSIS — Z79899 Other long term (current) drug therapy: Secondary | ICD-10-CM | POA: Diagnosis not present

## 2020-08-25 DIAGNOSIS — Z6835 Body mass index (BMI) 35.0-35.9, adult: Secondary | ICD-10-CM | POA: Diagnosis not present

## 2020-08-25 DIAGNOSIS — E1140 Type 2 diabetes mellitus with diabetic neuropathy, unspecified: Secondary | ICD-10-CM | POA: Diagnosis present

## 2020-08-25 DIAGNOSIS — M109 Gout, unspecified: Secondary | ICD-10-CM | POA: Diagnosis present

## 2020-08-25 DIAGNOSIS — G8929 Other chronic pain: Secondary | ICD-10-CM | POA: Diagnosis present

## 2020-08-25 DIAGNOSIS — E785 Hyperlipidemia, unspecified: Secondary | ICD-10-CM | POA: Diagnosis present

## 2020-08-25 DIAGNOSIS — D631 Anemia in chronic kidney disease: Secondary | ICD-10-CM | POA: Diagnosis present

## 2020-08-25 LAB — CBC WITH DIFFERENTIAL/PLATELET
Abs Immature Granulocytes: 0.03 10*3/uL (ref 0.00–0.07)
Basophils Absolute: 0 10*3/uL (ref 0.0–0.1)
Basophils Relative: 0 %
Eosinophils Absolute: 0.1 10*3/uL (ref 0.0–0.5)
Eosinophils Relative: 1 %
HCT: 33.3 % — ABNORMAL LOW (ref 39.0–52.0)
Hemoglobin: 10.8 g/dL — ABNORMAL LOW (ref 13.0–17.0)
Immature Granulocytes: 0 %
Lymphocytes Relative: 13 %
Lymphs Abs: 1.1 10*3/uL (ref 0.7–4.0)
MCH: 28.3 pg (ref 26.0–34.0)
MCHC: 32.4 g/dL (ref 30.0–36.0)
MCV: 87.2 fL (ref 80.0–100.0)
Monocytes Absolute: 0.6 10*3/uL (ref 0.1–1.0)
Monocytes Relative: 6 %
Neutro Abs: 7 10*3/uL (ref 1.7–7.7)
Neutrophils Relative %: 80 %
Platelets: 272 10*3/uL (ref 150–400)
RBC: 3.82 MIL/uL — ABNORMAL LOW (ref 4.22–5.81)
RDW: 13.2 % (ref 11.5–15.5)
WBC: 8.7 10*3/uL (ref 4.0–10.5)
nRBC: 0 % (ref 0.0–0.2)

## 2020-08-25 LAB — COMPREHENSIVE METABOLIC PANEL
ALT: 13 U/L (ref 0–44)
AST: 15 U/L (ref 15–41)
Albumin: 3.9 g/dL (ref 3.5–5.0)
Alkaline Phosphatase: 51 U/L (ref 38–126)
Anion gap: 16 — ABNORMAL HIGH (ref 5–15)
BUN: 33 mg/dL — ABNORMAL HIGH (ref 8–23)
CO2: 23 mmol/L (ref 22–32)
Calcium: 9.7 mg/dL (ref 8.9–10.3)
Chloride: 93 mmol/L — ABNORMAL LOW (ref 98–111)
Creatinine, Ser: 9.01 mg/dL — ABNORMAL HIGH (ref 0.61–1.24)
GFR, Estimated: 6 mL/min — ABNORMAL LOW (ref >60)
Glucose, Bld: 100 mg/dL — ABNORMAL HIGH (ref 70–99)
Potassium: 4.2 mmol/L (ref 3.5–5.1)
Sodium: 132 mmol/L — ABNORMAL LOW (ref 135–145)
Total Bilirubin: 0.9 mg/dL (ref 0.3–1.2)
Total Protein: 9.3 g/dL — ABNORMAL HIGH (ref 6.5–8.1)

## 2020-08-25 LAB — URINALYSIS, COMPLETE (UACMP) WITH MICROSCOPIC
Bacteria, UA: NONE SEEN
Bilirubin Urine: NEGATIVE
Glucose, UA: 50 mg/dL — AB
Ketones, ur: NEGATIVE mg/dL
Nitrite: NEGATIVE
Protein, ur: >=300 mg/dL — AB
RBC / HPF: >50 RBC/hpf — ABNORMAL HIGH (ref 0–5)
Specific Gravity, Urine: 1.009 (ref 1.005–1.030)
Squamous Epithelial / HPF: NONE SEEN (ref 0–5)
pH: 8 (ref 5.0–8.0)

## 2020-08-25 LAB — RESP PANEL BY RT-PCR (FLU A&B, COVID) ARPGX2
Influenza A by PCR: NEGATIVE
Influenza B by PCR: NEGATIVE
SARS Coronavirus 2 by RT PCR: NEGATIVE

## 2020-08-25 LAB — GLUCOSE, CAPILLARY
Glucose-Capillary: 78 mg/dL (ref 70–99)
Glucose-Capillary: 80 mg/dL (ref 70–99)

## 2020-08-25 LAB — TROPONIN I (HIGH SENSITIVITY)
Troponin I (High Sensitivity): 29 ng/L — ABNORMAL HIGH (ref <18)
Troponin I (High Sensitivity): 33 ng/L — ABNORMAL HIGH (ref <18)

## 2020-08-25 LAB — LACTIC ACID, PLASMA: Lactic Acid, Venous: 1.1 mmol/L (ref 0.5–1.9)

## 2020-08-25 LAB — HEMOGLOBIN A1C
Hgb A1c MFr Bld: 5.8 % — ABNORMAL HIGH (ref 4.8–5.6)
Mean Plasma Glucose: 119.76 mg/dL

## 2020-08-25 IMAGING — MR MR FOOT*R* W/O CM
6 series · 40 of 40 positions shown · non-contrast
Comparison: X-ray right foot [DATE].

CLINICAL DATA: Xray Erosive changes in the second distal phalanx
consistent with osteomyelitis

EXAM:
MRI OF THE RIGHT FOREFOOT WITHOUT CONTRAST
TECHNIQUE: Multiplanar, multisequence MR imaging of the right was performed. No
intravenous contrast was administered.

[Series 4: T1 · coronal · right · 3.0mm · 0.38mm/px · 8 of 45 slices shown (1 of 3)]
[im 1/45]
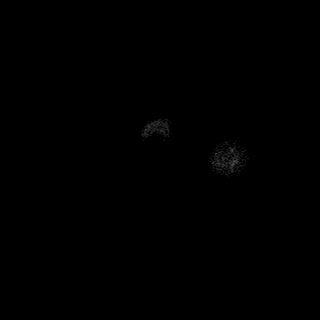
[im 7/45]
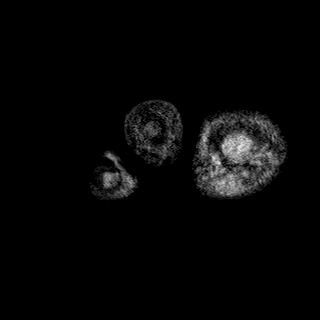
[im 13/45]
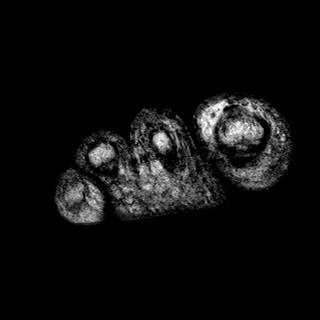
[im 19/45]
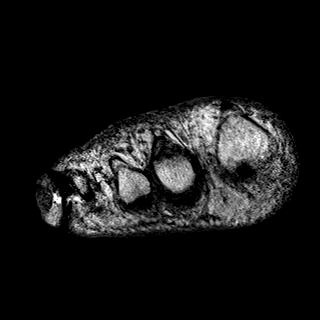
[im 26/45]
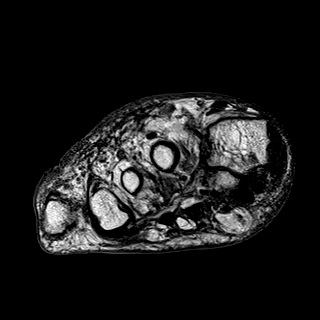
[im 32/45]
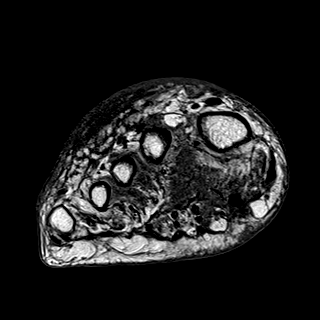
[im 38/45]
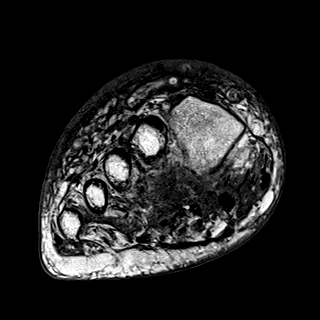
[im 45/45]
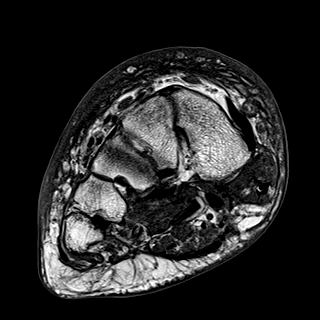

[Series 6: T2 · coronal · right · 3.0mm · 0.50mm/px · 9 of 45 slices shown (1 of 2)]
[im 1/45]
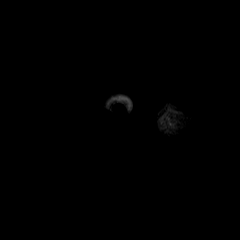
[im 6/45]
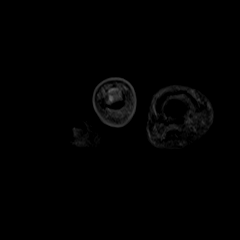
[im 12/45]
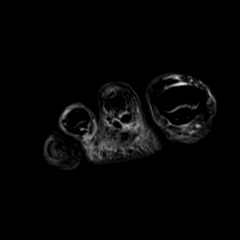
[im 17/45]
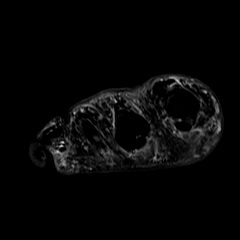
[im 23/45]
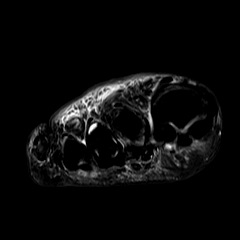
[im 28/45]
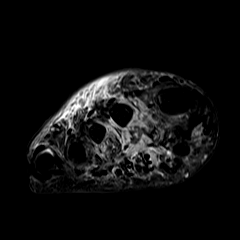
[im 34/45]
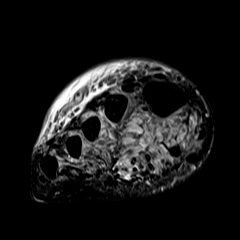
[im 39/45]
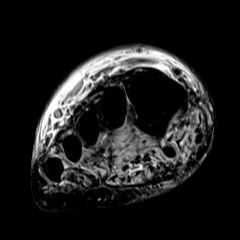
[im 45/45]
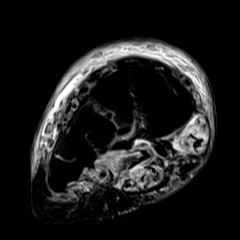

[Series 7: T1 · axial · right · 3.0mm · 0.70mm/px · z∈[-113,-36]mm · 4 of 22 slices shown (2 of 3)]
[im 1/22]
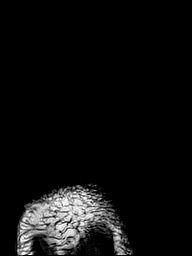
[im 8/22]
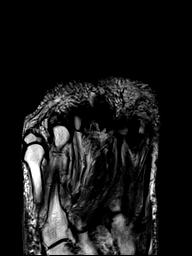
[im 15/22]
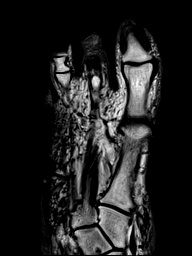
[im 22/22]
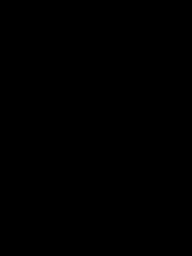

[Series 9: T2 · axial · right · 3.0mm · 0.70mm/px · z∈[-113,-36]mm · 4 of 22 slices shown (2 of 2)]
[im 1/22]
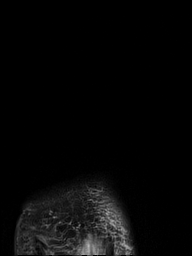
[im 8/22]
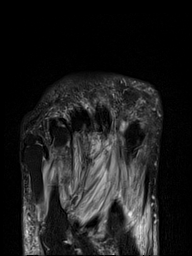
[im 15/22]
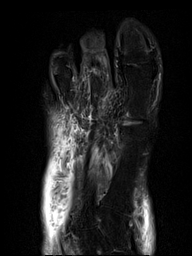
[im 22/22]
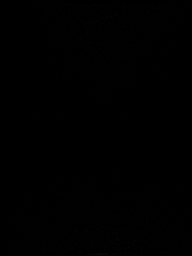

[Series 10: STIR · sagittal · right · 3.0mm · 0.62mm/px · 6 of 30 slices shown]
[im 1/30]
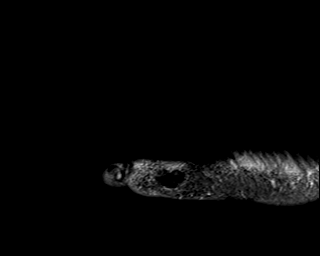
[im 6/30]
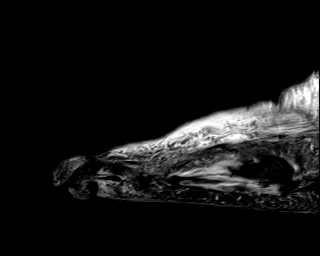
[im 12/30]
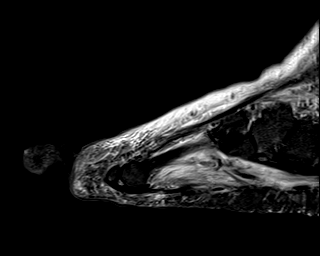
[im 18/30]
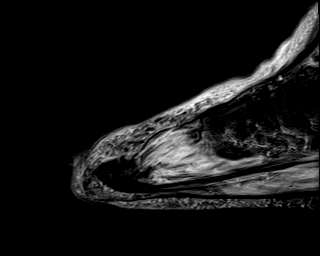
[im 24/30]
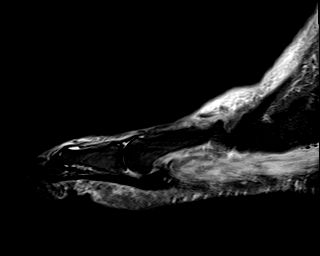
[im 30/30]
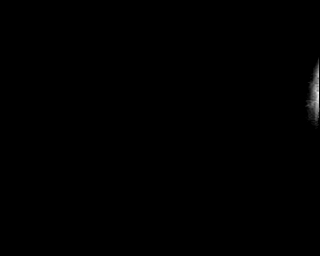

[Series 11: T1 · coronal · right · 3.0mm · 0.38mm/px · 9 of 45 slices shown (3 of 3)]
[im 1/45]
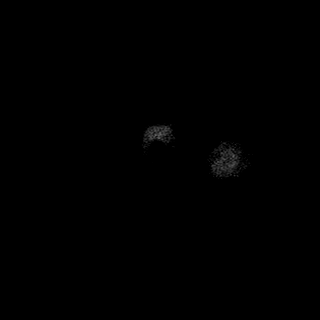
[im 6/45]
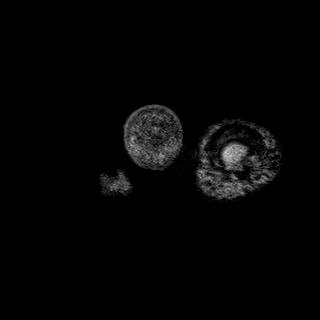
[im 12/45]
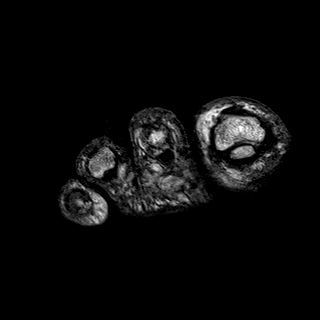
[im 17/45]
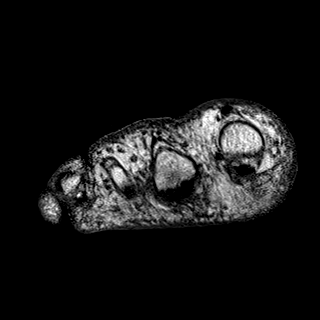
[im 23/45]
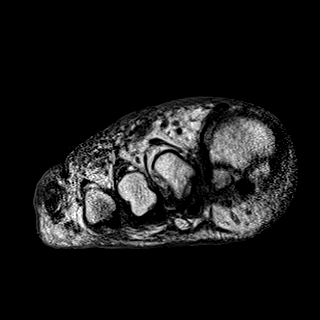
[im 28/45]
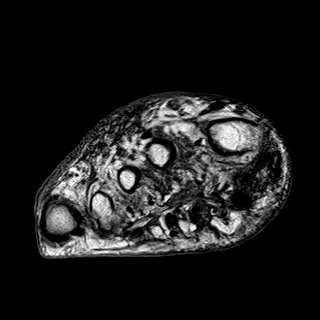
[im 34/45]
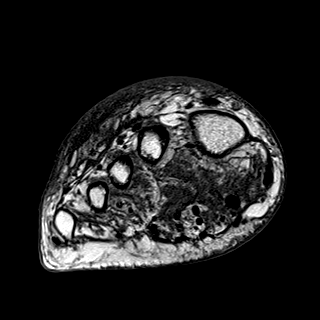
[im 39/45]
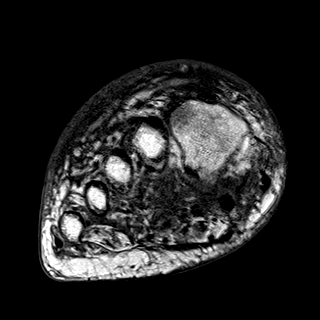
[im 45/45]
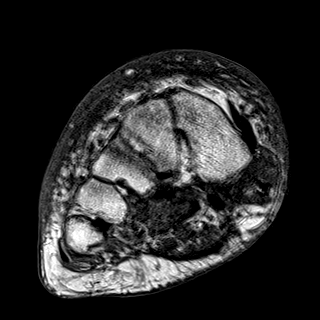

[40 of 40 positions shown; findings below may reference images not displayed]

FINDINGS: Bones/Joint/Cartilage

Destruction of the majority of the second digit distal phalanx with
associated hypointensity of the base of the second digit distal
phalanx, as well as of the second digit middle phalanx, and likely
the head of the second digit proximal phalanx on on T1 sequence with
associated hyperintensity on STIR imaging.

Destruction of the majority of the fifth digit middle and distal
phalanges with associated hypointensity of the head/neck region of
the proximal phalanx and of the remaining middle and distal
phalanges on T1 sequence. These bones are not well visualized on the
T2 and STIR sequences.

Degenerative changes of the first metatarsal interphalangeal joint.

Ligaments

Unremarkable.

Muscles and Tendons

No associated intramuscular abscess formation.

Soft tissues

Overlying soft tissue defect of the distal second digit. Dermal
thickening of the second digit. Associated subcutaneus tissue the
dorsal foot.
IMPRESSION: 1. Osteomyelitis of the second digit proximal, middle, and likely
distal phalanges.
2. Possible osteomyelitis of the fifth digit proximal, middle, and
distal phalanges.

## 2020-08-25 IMAGING — DX DG FOOT COMPLETE 3+V*R*
3 series · 3 of 3 positions shown · non-contrast
Comparison: None.

CLINICAL DATA: Second toe wound with pain and history of gout,
initial encounter

EXAM:
RIGHT FOOT COMPLETE - 3+ VIEW

[foot ap]
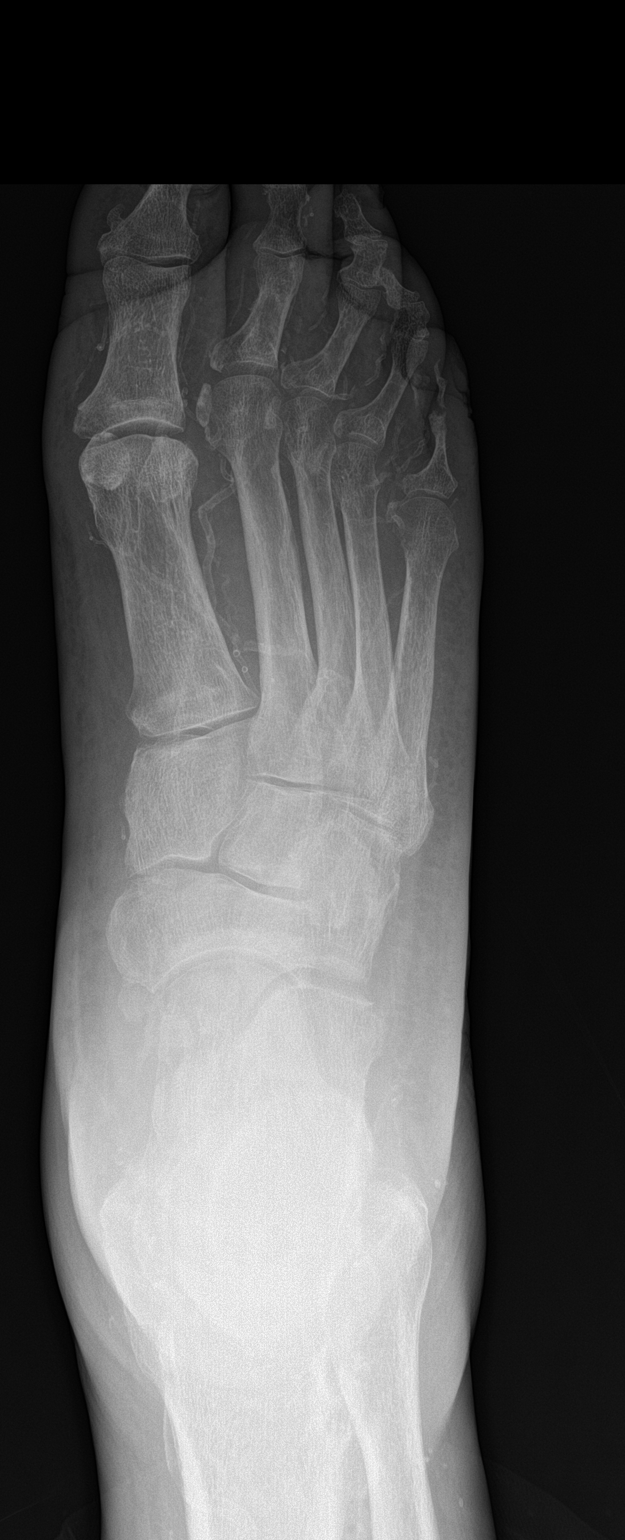

[foot obl]
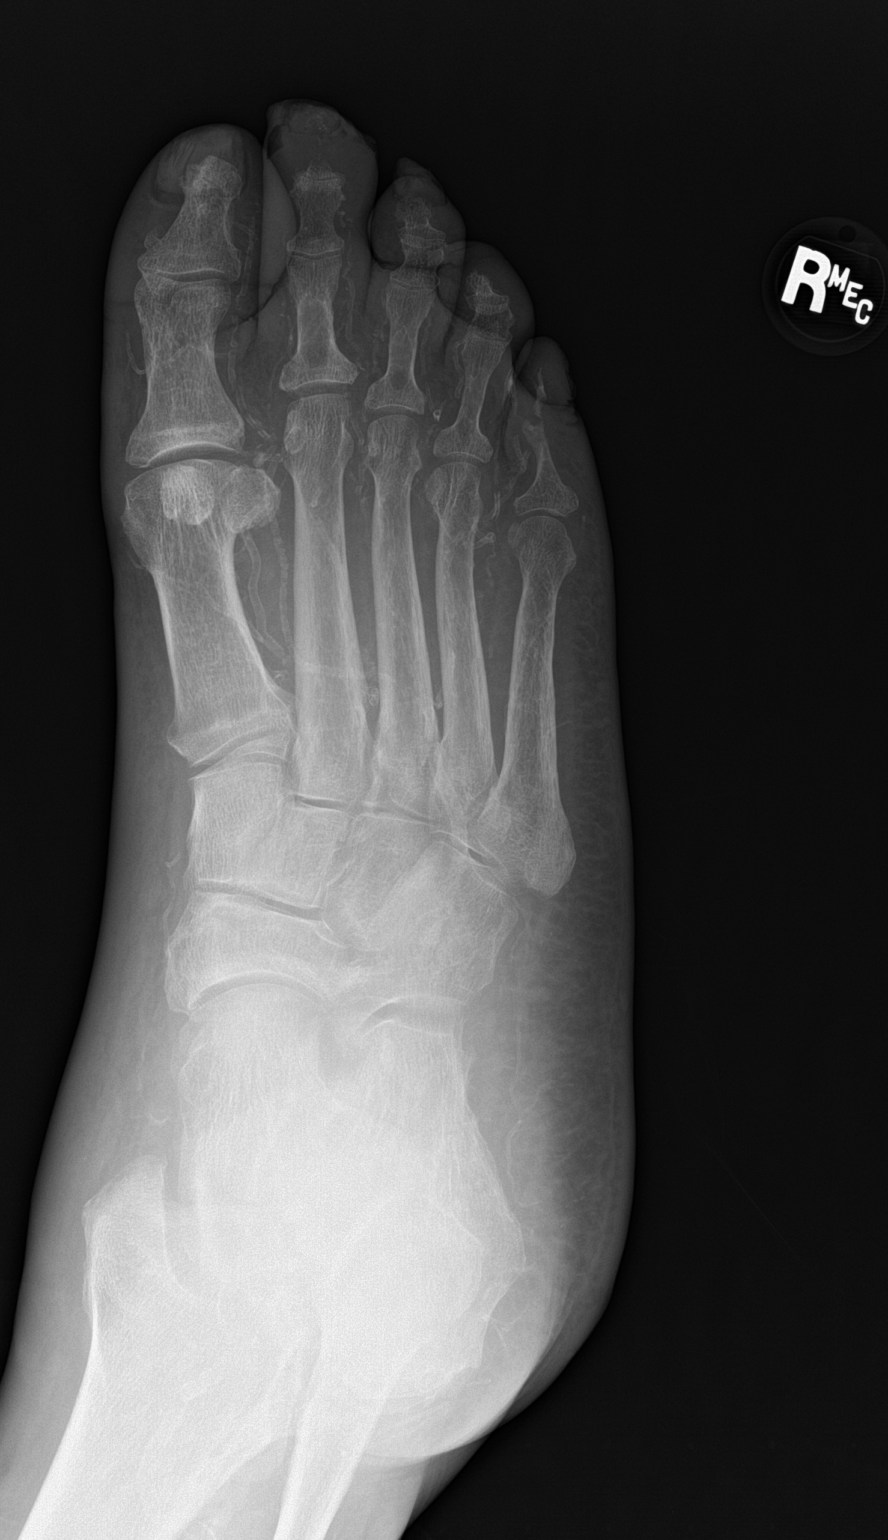

[foot lat]
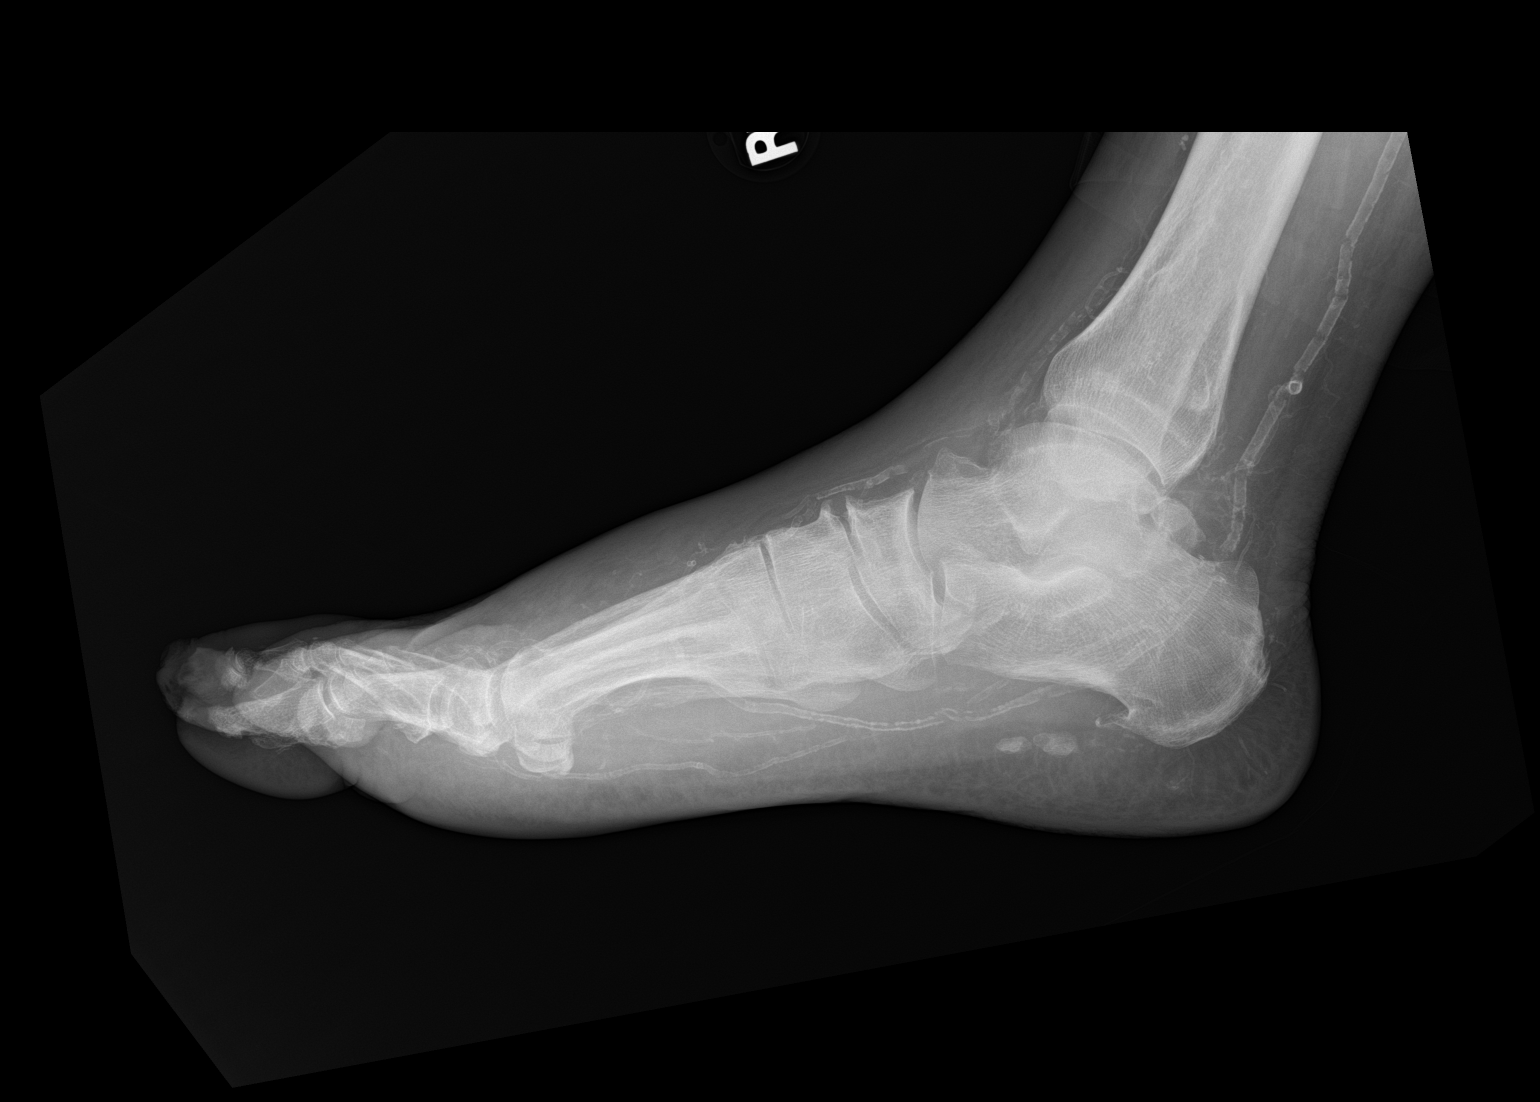

[3 of 3 positions shown; findings below may reference images not displayed]

FINDINGS: Soft tissue wound is noted in the distal aspect of the second toe.
Resorption of the second distal phalanx is noted consistent with
osteomyelitis. Resorptive changes in the fifth digit are noted
likely of a chronic nature given its appearance. Diffuse vascular
calcifications are seen. Mild soft tissue swelling is noted. No
fracture or dislocation is seen.
IMPRESSION: Erosive changes in the second distal phalanx consistent with
osteomyelitis.

## 2020-08-25 IMAGING — DX DG CHEST 1V PORT
1 series · 1 of 1 positions shown · non-contrast
Comparison: [DATE]

CLINICAL DATA: Cough

EXAM:
PORTABLE CHEST 1 VIEW

[chest ap]
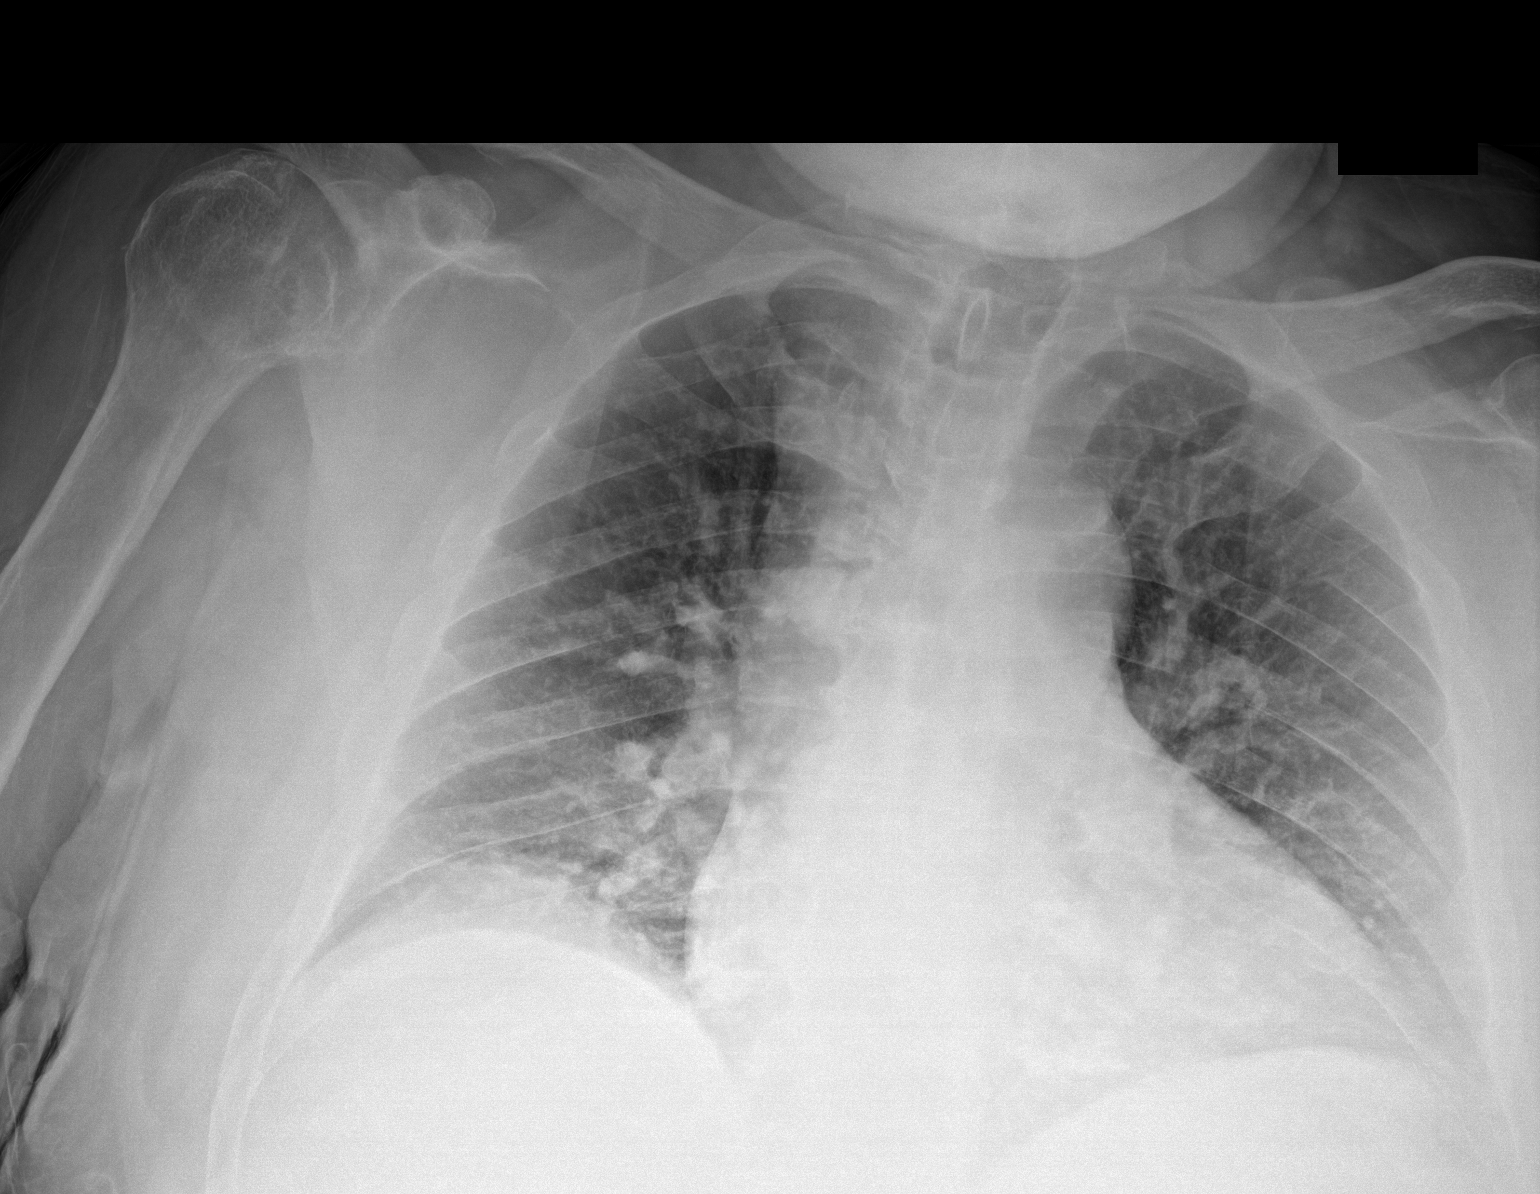

[1 of 1 positions shown; findings below may reference images not displayed]

FINDINGS: Cardiac shadow is at the upper limits of normal in size. Mild
vascular congestion is noted without interstitial edema. No focal
infiltrate or sizable effusion is seen. No bony abnormality is
noted.
IMPRESSION: Mild vascular congestion without edema or focal infiltrate.

## 2020-08-25 MED ORDER — VANCOMYCIN HCL IN DEXTROSE 1-5 GM/200ML-% IV SOLN
1000.0000 mg | INTRAVENOUS | Status: DC
  Filled 2020-08-25: qty 200

## 2020-08-25 MED ORDER — LACTATED RINGERS IV BOLUS (SEPSIS)
1000.0000 mL | Freq: Once | INTRAVENOUS | Status: DC
  Administered 2020-08-25: 15:00:00 1000 mL via INTRAVENOUS

## 2020-08-25 MED ORDER — HYDROCODONE-ACETAMINOPHEN 5-325 MG PO TABS
1.0000 | ORAL_TABLET | Freq: Four times a day (QID) | ORAL | Status: DC | PRN
  Administered 2020-08-25 – 2020-08-28 (×4): 1 via ORAL
  Filled 2020-08-25 (×4): qty 1

## 2020-08-25 MED ORDER — VANCOMYCIN HCL 2000 MG/400ML IV SOLN
2000.0000 mg | Freq: Once | INTRAVENOUS | Status: AC
  Administered 2020-08-25: 15:00:00 2000 mg via INTRAVENOUS
  Filled 2020-08-25: qty 400

## 2020-08-25 MED ORDER — METRONIDAZOLE 500 MG PO TABS
500.0000 mg | ORAL_TABLET | Freq: Three times a day (TID) | ORAL | Status: DC
  Administered 2020-08-25 – 2020-08-26 (×2): 500 mg via ORAL
  Filled 2020-08-25 (×2): qty 1

## 2020-08-25 MED ORDER — LABETALOL HCL 200 MG PO TABS
200.0000 mg | ORAL_TABLET | Freq: Two times a day (BID) | ORAL | Status: DC
  Administered 2020-08-25 – 2020-08-26 (×2): 200 mg via ORAL
  Filled 2020-08-25 (×3): qty 1

## 2020-08-25 MED ORDER — OXYCODONE-ACETAMINOPHEN 5-325 MG PO TABS
1.0000 | ORAL_TABLET | Freq: Once | ORAL | Status: AC
  Administered 2020-08-25: 12:00:00 1 via ORAL
  Filled 2020-08-25: qty 1

## 2020-08-25 MED ORDER — AMLODIPINE BESYLATE 10 MG PO TABS
10.0000 mg | ORAL_TABLET | Freq: Every day | ORAL | Status: DC
  Administered 2020-08-25 – 2020-08-29 (×4): 10 mg via ORAL
  Filled 2020-08-25 (×2): qty 1
  Filled 2020-08-25 (×2): qty 2
  Filled 2020-08-25: qty 1

## 2020-08-25 MED ORDER — LOSARTAN POTASSIUM 50 MG PO TABS
50.0000 mg | ORAL_TABLET | Freq: Every day | ORAL | Status: DC
  Administered 2020-08-26: 08:00:00 50 mg via ORAL
  Filled 2020-08-25: qty 1

## 2020-08-25 MED ORDER — ONDANSETRON 4 MG PO TBDP
4.0000 mg | ORAL_TABLET | Freq: Once | ORAL | Status: AC
  Administered 2020-08-25: 13:00:00 4 mg via ORAL

## 2020-08-25 MED ORDER — CEFAZOLIN SODIUM-DEXTROSE 2-4 GM/100ML-% IV SOLN
2.0000 g | Freq: Once | INTRAVENOUS | Status: AC
  Administered 2020-08-25: 14:00:00 2 g via INTRAVENOUS
  Filled 2020-08-25: qty 100

## 2020-08-25 MED ORDER — SODIUM CHLORIDE 0.9 % IV SOLN
2.0000 g | INTRAVENOUS | Status: DC
  Administered 2020-08-25: 22:00:00 2 g via INTRAVENOUS
  Filled 2020-08-25: qty 2
  Filled 2020-08-25: qty 20

## 2020-08-25 MED ORDER — SERTRALINE HCL 50 MG PO TABS
100.0000 mg | ORAL_TABLET | Freq: Every day | ORAL | Status: DC
  Administered 2020-08-26 – 2020-08-29 (×3): 100 mg via ORAL
  Filled 2020-08-25 (×3): qty 2

## 2020-08-25 MED ORDER — ONDANSETRON 4 MG PO TBDP
ORAL_TABLET | ORAL | Status: AC
  Administered 2020-08-25: 14:00:00 4 mg via ORAL
  Filled 2020-08-25: qty 1

## 2020-08-25 MED ORDER — LACTATED RINGERS IV BOLUS (SEPSIS)
1000.0000 mL | Freq: Once | INTRAVENOUS | Status: AC
  Administered 2020-08-25: 14:00:00 1000 mL via INTRAVENOUS

## 2020-08-25 MED ORDER — ACETAMINOPHEN 325 MG PO TABS
650.0000 mg | ORAL_TABLET | Freq: Once | ORAL | Status: AC
  Administered 2020-08-25: 14:00:00 650 mg via ORAL
  Filled 2020-08-25: qty 2

## 2020-08-25 MED ORDER — VANCOMYCIN HCL 500 MG/100ML IV SOLN
500.0000 mg | Freq: Once | INTRAVENOUS | Status: AC
  Administered 2020-08-25: 19:00:00 500 mg via INTRAVENOUS
  Filled 2020-08-25: qty 100

## 2020-08-25 MED ORDER — CALCIUM ACETATE (PHOS BINDER) 667 MG PO CAPS
1334.0000 mg | ORAL_CAPSULE | Freq: Three times a day (TID) | ORAL | Status: DC
  Administered 2020-08-25 – 2020-08-29 (×5): 1334 mg via ORAL
  Filled 2020-08-25 (×6): qty 2

## 2020-08-25 MED ORDER — INSULIN ASPART 100 UNIT/ML ~~LOC~~ SOLN
0.0000 [IU] | Freq: Three times a day (TID) | SUBCUTANEOUS | Status: DC
  Administered 2020-08-27: 18:00:00 1 [IU] via SUBCUTANEOUS
  Filled 2020-08-25: qty 1

## 2020-08-25 MED ORDER — ATORVASTATIN CALCIUM 10 MG PO TABS
10.0000 mg | ORAL_TABLET | Freq: Every evening | ORAL | Status: DC
  Administered 2020-08-25 – 2020-08-28 (×3): 10 mg via ORAL
  Filled 2020-08-25 (×3): qty 1

## 2020-08-25 MED ORDER — ONDANSETRON 4 MG PO TBDP
4.0000 mg | ORAL_TABLET | Freq: Once | ORAL | Status: AC
  Administered 2020-08-25: 12:00:00 4 mg via ORAL
  Filled 2020-08-25: qty 1

## 2020-08-25 MED ORDER — LOSARTAN POTASSIUM 50 MG PO TABS: 50.0000 mg | ORAL_TABLET | Freq: Every day | ORAL | Status: DC

## 2020-08-25 MED ORDER — CYCLOBENZAPRINE HCL 10 MG PO TABS: 10.0000 mg | ORAL_TABLET | Freq: Every evening | ORAL | Status: DC | PRN

## 2020-08-25 MED ORDER — LACTATED RINGERS IV SOLN: INTRAVENOUS | Status: DC

## 2020-08-25 MED ORDER — SODIUM CHLORIDE 0.9 % IV SOLN: INTRAVENOUS | Status: DC

## 2020-08-25 MED ORDER — LACTATED RINGERS IV BOLUS (SEPSIS)
400.0000 mL | Freq: Once | INTRAVENOUS | Status: AC
  Administered 2020-08-25: 15:00:00 400 mL via INTRAVENOUS
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
## 2020-08-25 NOTE — ED Notes (Signed)
IV team consult placed due to pt being difficult IV start and unable to obtain labs. This RN and [employer] [facility] both attempted and were unsuccessful.

## 2020-08-25 NOTE — Progress Notes (Signed)
Assessed r second toe - cleaned with normal saline patted wound dry - applied vaseline gauze. Gauze roll and taped to contain bloody drainage - wound bed granulated, edges irregular and dry and thick dry skin to peri wound

## 2020-08-25 NOTE — Progress Notes (Signed)
[facility], [place] 08/25/20  Subjective:   LOS: 0  Patient known to our practice from outpatient dialysis Last HD was on December 18.  Target weight was achieved Patient presents for right-sided ankle and knee pain.  Noted to have discoloration and sore on his right second toe.  X-ray suggests osteomyelitis.  He is getting IV vancomycin when seen.  Objective:  Vital signs in last 24 hours:  Temp:  [99.5 F (37.5 C)-103 F (39.4 C)] 99.5 F (37.5 C) (12/20 1945) Pulse Rate:  [95-102] 95 (12/20 1945) Resp:  [16-22] 16 (12/20 1945) BP: (157-210)/(68-123) 157/68 (12/20 1945) SpO2:  [96 %-100 %] 96 % (12/20 1945) Weight:  [115.2 kg] 115.2 kg (12/20 1101)  Weight change:  Filed Weights   08/25/20 1101  Weight: 115.2 kg    Intake/Output:    Intake/Output Summary (Last 24 hours) at 08/25/2020 2021 Last data filed at 08/25/2020 1743 Gross per 24 hour  Intake 3180.99 ml  Output --  Net 3180.99 ml     Physical Exam: General:  No acute distress, lying in the bed  HEENT  anicteric, moist oral mucous membranes  Pulm/lungs  normal breathing effort, clear to auscultation  CVS/Heart  regular, no rub  Abdomen:   Soft, nontender  Extremities:  Trace to 1+ edema, right second toe infection  Neurologic:  Alert, able to answer questions appropriate  Skin:  no acute rashes  Access:  Left arm AV fistula       Basic Metabolic Panel:  Recent Labs  Lab 08/25/20 1335  NA 132*  K 4.2  CL 93*  CO2 23  GLUCOSE 100*  BUN 33*  CREATININE 9.01*  CALCIUM 9.7     CBC: Recent Labs  Lab 08/25/20 1335  WBC 8.7  NEUTROABS 7.0  HGB 10.8*  HCT 33.3*  MCV 87.2  PLT 272      Lab Results  Component Value Date   HEPBSAG  04/02/2019    THIS TEST WAS ORDERED IN ERROR AND HAS BEEN CREDITED.   HEPBSAB Non Reactive 04/02/2019   HEPBIGM Negative 04/02/2019      Microbiology:  Recent Results (from the past 240 hour(s))  Resp Panel by RT-PCR  (Flu A&B, Covid) Nasopharyngeal Swab     Status: None   Collection Time: 08/25/20 12:29 PM   Specimen: Nasopharyngeal Swab; Nasopharyngeal(NP) swabs in vial transport medium  Result Value Ref Range Status   SARS Coronavirus 2 by RT PCR NEGATIVE NEGATIVE Final    Comment: (NOTE) SARS-CoV-2 target nucleic acids are NOT DETECTED.  The SARS-CoV-2 RNA is generally detectable in upper respiratory specimens during the acute phase of infection. The lowest concentration of SARS-CoV-2 viral copies this assay can detect is 138 copies/mL. A negative result does not preclude SARS-Cov-2 infection and should not be used as the sole basis for treatment or other patient management decisions. A negative result may occur with  improper specimen collection/handling, submission of specimen other than nasopharyngeal swab, presence of viral mutation(s) within the areas targeted by this assay, and inadequate number of viral copies(<138 copies/mL). A negative result must be combined with clinical observations, patient history, and epidemiological information. The expected result is Negative.  Fact Sheet for Patients:  EntrepreneurPulse.com.au  Fact Sheet for Healthcare Providers:  IncredibleEmployment.be  This test is no t yet approved or cleared by the [place] FDA and  has been authorized for detection and/or diagnosis of SARS-CoV-2 by FDA under an Emergency Use Authorization (EUA). This EUA will  remain  in effect (meaning this test can be used) for the duration of the COVID-19 declaration under Section 564(b)(1) of the Act, 21 U.S.C.section 360bbb-3(b)(1), unless the authorization is terminated  or revoked sooner.       Influenza A by PCR NEGATIVE NEGATIVE Final   Influenza B by PCR NEGATIVE NEGATIVE Final    Comment: (NOTE) The Xpert Xpress SARS-CoV-2/FLU/RSV plus assay is intended as an aid in the diagnosis of influenza from Nasopharyngeal swab specimens  and should not be used as a sole basis for treatment. Nasal washings and aspirates are unacceptable for Xpert Xpress SARS-CoV-2/FLU/RSV testing.  Fact Sheet for Patients: EntrepreneurPulse.com.au  Fact Sheet for Healthcare Providers: IncredibleEmployment.be  This test is not yet approved or cleared by the [place] FDA and has been authorized for detection and/or diagnosis of SARS-CoV-2 by FDA under an Emergency Use Authorization (EUA). This EUA will remain in effect (meaning this test can be used) for the duration of the COVID-19 declaration under Section 564(b)(1) of the Act, 21 U.S.C. section 360bbb-3(b)(1), unless the authorization is terminated or revoked.  Performed at [facility], [address] [place], [place] [address]     Coagulation Studies: No results for input(s): LABPROT, INR in the last 72 hours.  Urinalysis: No results for input(s): COLORURINE, LABSPEC, PHURINE, GLUCOSEU, HGBUR, BILIRUBINUR, KETONESUR, PROTEINUR, UROBILINOGEN, NITRITE, LEUKOCYTESUR in the last 72 hours.  Invalid input(s): APPERANCEUR    Imaging: DG Chest Portable 1 View  Result Date: 08/25/2020 CLINICAL DATA:  Cough EXAM: PORTABLE CHEST 1 VIEW COMPARISON:  03/29/2019 FINDINGS: Cardiac shadow is at the upper limits of normal in size. Mild vascular congestion is noted without interstitial edema. No focal infiltrate or sizable effusion is seen. No bony abnormality is noted. IMPRESSION: Mild vascular congestion without edema or focal infiltrate. Electronically Signed   By: Inez Catalina M.D.   On: 08/25/2020 12:36   DG Foot Complete Right  Result Date: 08/25/2020 CLINICAL DATA:  Second toe wound with pain and history of gout, initial encounter EXAM: RIGHT FOOT COMPLETE - 3+ VIEW COMPARISON:  None. FINDINGS: Soft tissue wound is noted in the distal aspect of the second toe. Resorption of the second distal phalanx is noted consistent with  osteomyelitis. Resorptive changes in the fifth digit are noted likely of a chronic nature given its appearance. Diffuse vascular calcifications are seen. Mild soft tissue swelling is noted. No fracture or dislocation is seen. IMPRESSION: Erosive changes in the second distal phalanx consistent with osteomyelitis. Electronically Signed   By: Inez Catalina M.D.   On: 08/25/2020 12:39     Medications:   . sodium chloride 10 mL/hr at 08/25/20 1829  . cefTRIAXone (ROCEPHIN)  IV    . [START ON 08/26/2020] vancomycin     . amLODipine  10 mg Oral Daily  . atorvastatin  10 mg Oral QPM  . calcium acetate  1,334 mg Oral TID WC  . [START ON 08/26/2020] insulin aspart  0-6 Units Subcutaneous TID WC  . labetalol  200 mg Oral BID  . losartan  50 mg Oral Daily  . metroNIDAZOLE  500 mg Oral Q8H  . sertraline  100 mg Oral Daily   cyclobenzaprine, HYDROcodone-acetaminophen  Assessment/ Plan:  58 y.o. male with end-stage renal disease, diabetes, hypertension, chronic back pain was admitted on 08/25/2020 for  Principal Problem:   Osteomyelitis of second toe of right foot ([place]) Active Problems:   Poorly controlled type 2 diabetes mellitus ([place])   Essential hypertension   Obesity (  BMI 30.0-34.9)   ESRD on dialysis ([place])   Systemic inflammatory response syndrome (SIRS) without organ dysfunction ([place])  Osteomyelitis of second toe of right foot ([place]) [M86.9] Sepsis, due to unspecified organism, unspecified whether acute organ dysfunction present ([place]) [A41.9]  #. ESRD [facility]/TTS/left arm AV fistula/255 minutes / 116 kg First day of dialysis April 02, 2019 Electrolytes and volume status are acceptable No acute indication for dialysis today Patient is to undergo Surgery and possible amputation of his right second toe on Tuesday. Plan for dialysis on Wednesday  #. Anemia of CKD  Lab Results  Component Value Date   HGB 10.8 (L) 08/25/2020   Low dose EPO with HD  #. Secondary  hyperparathyroidism of renal origin N 25.81      Component Value Date/Time   PTH 152 (H) 04/02/2019 2023   Lab Results  Component Value Date   PHOS 4.0 04/05/2019   Monitor calcium and phos level during this admission   #. Diabetes type 2 with CKD, peripheral neuropathy and peripheral rash Hgb A1c MFr Bld (%)  Date Value  03/29/2019 6.8 (H)   #Right second toe osteomyelitis Amputation and debridement planned for 08/26/2020 Broad-spectrum antibiotics including IV Rocephin, metronidazole, IV vancomycin   LOS: 0 Winner Valeriano 12/20/20218:21 PM  [facility], [place]

## 2020-08-25 NOTE — Progress Notes (Signed)
Pharmacy Antibiotic Note  Steve Vance is a 59 y.o. male admitted on 08/25/2020 with suspected diabetic foot infection and possible osteomyelitis of second toe of right foot. Patient has past medication history of ESRD (HD Tu,Thurs, Sat), HTN, Uncontrolled DM, and obesity. Pharmacy has been consulted for Vancomycin dosing. Patient is also receiving ceftriaxone and Flagyl. Patient received vancomycin 2000mg  loading dose in ED.   Plan: Will order an additional vancomycin 500mg  IV for a total loading dose of 2500 mg.  Will order Vancomycin 1000mg  IV to be given with HD on HD days. Will plan to draw vanc random level after 2nd or 3rd HD session. Target Vancomycin level 15-25 mcg/mL    Height: 5\' 11"  (180.3 cm) Weight: 115.2 kg (254 lb) IBW/kg (Calculated) : 75.3  Temp (24hrs), Avg:101.4 F (38.6 C), Min:99.6 F (37.6 C), Max:103 F (39.4 C)  Recent Labs  Lab 08/25/20 1335  WBC 8.7  CREATININE 9.01*  LATICACIDVEN 1.1    Estimated Creatinine Clearance: 11.1 mL/min (A) (by C-G formula based on SCr of 9.01 mg/dL (H)).    Allergies  Allergen Reactions  . Baclofen Nausea And Vomiting  . Gabapentin Nausea And Vomiting    Antimicrobials this admission: 12/20 ceftriaxone >> 12/20 vancomycin >>  12/20 Flagyl>>   Microbiology results: 12/20 BCx: pending  Thank you for allowing pharmacy to be a part of this patient's care.  Pernell Dupre, PharmD, BCPS Clinical Pharmacist 08/25/2020 3:37 PM

## 2020-08-25 NOTE — Progress Notes (Signed)
CODE SEPSIS - PHARMACY COMMUNICATION  **Broad Spectrum Antibiotics should be administered within 1 hour of Sepsis diagnosis**  Time Code Sepsis Called/Page Received: @ 1250  Antibiotics Ordered:  Vancomycin, cefazolin   Time of 1st antibiotic administration: @ 1351   Additional action taken by pharmacy:  -Messaged provider at 1315 about no abx ordered for Code sepsis -Spoke with RN @ 1335 about time left to administer abx    If necessary, Name of Provider/Nurse Contacted: Lillia Mountain  PAC, and  Sarah RN  Pernell Dupre, PharmD, BCPS Clinical Pharmacist 08/25/2020 1:45 PM
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
## 2020-08-25 NOTE — Plan of Care (Signed)
  Problem: Education: Goal: Knowledge of General Education information will improve Description: Including pain rating scale, medication(s)/side effects and non-pharmacologic comfort measures Outcome: Progressing   Problem: Health Behavior/Discharge Planning: Goal: Ability to manage health-related needs will improve Outcome: Progressing   Problem: Clinical Measurements: Goal: Ability to maintain clinical measurements within normal limits will improve Outcome: Progressing Goal: Will remain free from infection Outcome: Progressing Goal: Diagnostic test results will improve Outcome: Progressing Goal: Respiratory complications will improve Outcome: Progressing Goal: Cardiovascular complication will be avoided Outcome: Progressing   Problem: Coping: Goal: Level of anxiety will decrease Outcome: Progressing   Problem: Nutrition: Goal: Adequate nutrition will be maintained Outcome: Progressing   Problem: Elimination: Goal: Will not experience complications related to bowel motility Outcome: Progressing Goal: Will not experience complications related to urinary retention Outcome: Progressing  Recd tp gfloor - vs stable - pt assisted to br . Denies needs at this time
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
## 2020-08-25 NOTE — Progress Notes (Signed)
Elink monitoring for sepsis protocol 

## 2020-08-25 NOTE — Progress Notes (Signed)
Received report for new admit to room 133, 1A, from ED nurse, laura Cleston Lautner. RN.

## 2020-08-25 NOTE — ED Triage Notes (Signed)
Patient to ER for c/o right sided ankle and knee pain. Patient states "I think it's my gout."
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
## 2020-08-25 NOTE — Progress Notes (Signed)
PHARMACY -  BRIEF ANTIBIOTIC NOTE   Pharmacy has received consult(s) for vancomycin from an ED provider.  The patient's profile has been reviewed for ht/wt/allergies/indication/available labs.    One time order(s) placed for vancomycin 2g IV   Further antibiotics/pharmacy consults should be ordered by admitting physician if indicated.                       Thank you, Pernell Dupre, PharmD, BCPS Clinical Pharmacist 08/25/2020 1:44 PM

## 2020-08-26 ENCOUNTER — Inpatient Hospital Stay: Payer: Medicare Other | Admitting: Anesthesiology

## 2020-08-26 ENCOUNTER — Encounter
Admission: EM | Disposition: A | Discharge status: Home / Self Care | Payer: Self-pay | Source: Home / Self Care | Attending: Internal Medicine

## 2020-08-26 DIAGNOSIS — B182 Chronic viral hepatitis C: Secondary | ICD-10-CM

## 2020-08-26 DIAGNOSIS — N186 End stage renal disease: Secondary | ICD-10-CM

## 2020-08-26 DIAGNOSIS — B9562 Methicillin resistant Staphylococcus aureus infection as the cause of diseases classified elsewhere: Secondary | ICD-10-CM

## 2020-08-26 DIAGNOSIS — L089 Local infection of the skin and subcutaneous tissue, unspecified: Secondary | ICD-10-CM

## 2020-08-26 DIAGNOSIS — Z992 Dependence on renal dialysis: Secondary | ICD-10-CM

## 2020-08-26 DIAGNOSIS — I96 Gangrene, not elsewhere classified: Secondary | ICD-10-CM

## 2020-08-26 DIAGNOSIS — R7881 Bacteremia: Secondary | ICD-10-CM

## 2020-08-26 HISTORY — PX: AMPUTATION TOE: SHX6595

## 2020-08-26 LAB — GLUCOSE, CAPILLARY
Glucose-Capillary: 84 mg/dL (ref 70–99)
Glucose-Capillary: 145 mg/dL — ABNORMAL HIGH (ref 70–99)
Glucose-Capillary: 116 mg/dL — ABNORMAL HIGH (ref 70–99)
Glucose-Capillary: 113 mg/dL — ABNORMAL HIGH (ref 70–99)
Glucose-Capillary: 140 mg/dL — ABNORMAL HIGH (ref 70–99)

## 2020-08-26 LAB — BLOOD CULTURE ID PANEL (REFLEXED) - BCID2

## 2020-08-26 LAB — SURGICAL PCR SCREEN
MRSA, PCR: POSITIVE — AB
Staphylococcus aureus: POSITIVE — AB

## 2020-08-26 LAB — POTASSIUM: Potassium: 4.4 mmol/L (ref 3.5–5.1)

## 2020-08-26 SURGERY — AMPUTATION, TOE
Anesthesia: General | Site: Toe | Laterality: Right

## 2020-08-26 MED ORDER — PROPOFOL 500 MG/50ML IV EMUL
INTRAVENOUS | Status: DC | PRN
  Administered 2020-08-26: 50 ug/kg/min via INTRAVENOUS

## 2020-08-26 MED ORDER — METOPROLOL TARTRATE 50 MG PO TABS
50.0000 mg | ORAL_TABLET | Freq: Two times a day (BID) | ORAL | Status: DC
  Administered 2020-08-26 – 2020-08-29 (×6): 50 mg via ORAL
  Filled 2020-08-26 (×6): qty 1

## 2020-08-26 MED ORDER — SUCCINYLCHOLINE CHLORIDE 20 MG/ML IJ SOLN
INTRAMUSCULAR | Status: DC | PRN
  Administered 2020-08-26: 120 mg via INTRAVENOUS

## 2020-08-26 MED ORDER — NEOMYCIN-POLYMYXIN B GU 40-200000 IR SOLN
Status: AC
  Filled 2020-08-26: qty 2

## 2020-08-26 MED ORDER — VANCOMYCIN VARIABLE DOSE PER UNSTABLE RENAL FUNCTION (PHARMACIST DOSING): Status: DC

## 2020-08-26 MED ORDER — ONDANSETRON HCL 4 MG/2ML IJ SOLN: 4.0000 mg | Freq: Once | INTRAMUSCULAR | Status: DC | PRN

## 2020-08-26 MED ORDER — MORPHINE SULFATE (PF) 2 MG/ML IV SOLN
2.0000 mg | INTRAVENOUS | Status: DC | PRN
  Filled 2020-08-26: qty 1

## 2020-08-26 MED ORDER — VANCOMYCIN HCL IN DEXTROSE 1-5 GM/200ML-% IV SOLN
1000.0000 mg | Freq: Once | INTRAVENOUS | Status: DC
  Filled 2020-08-26: qty 200

## 2020-08-26 MED ORDER — DEXMEDETOMIDINE HCL 200 MCG/2ML IV SOLN
INTRAVENOUS | Status: DC | PRN
  Administered 2020-08-26: 8 ug via INTRAVENOUS

## 2020-08-26 MED ORDER — METOCLOPRAMIDE HCL 5 MG/ML IJ SOLN
10.0000 mg | Freq: Four times a day (QID) | INTRAMUSCULAR | Status: DC | PRN
  Filled 2020-08-26: qty 2

## 2020-08-26 MED ORDER — FENTANYL CITRATE (PF) 100 MCG/2ML IJ SOLN: 25.0000 ug | INTRAMUSCULAR | Status: DC | PRN

## 2020-08-26 MED ORDER — PHENYLEPHRINE HCL (PRESSORS) 10 MG/ML IV SOLN
INTRAVENOUS | Status: AC
  Filled 2020-08-26: qty 1

## 2020-08-26 MED ORDER — PHENYLEPHRINE HCL (PRESSORS) 10 MG/ML IV SOLN
INTRAVENOUS | Status: DC | PRN
  Administered 2020-08-26 (×2): 200 ug via INTRAVENOUS
  Administered 2020-08-26: 100 ug via INTRAVENOUS

## 2020-08-26 MED ORDER — MIDAZOLAM HCL 2 MG/2ML IJ SOLN
INTRAMUSCULAR | Status: AC
  Filled 2020-08-26: qty 2

## 2020-08-26 MED ORDER — MUPIROCIN 2 % EX OINT
1.0000 "application " | TOPICAL_OINTMENT | Freq: Two times a day (BID) | CUTANEOUS | Status: DC
  Administered 2020-08-26 – 2020-08-29 (×6): 1 via NASAL
  Filled 2020-08-26 (×2): qty 22

## 2020-08-26 MED ORDER — LISINOPRIL 20 MG PO TABS
20.0000 mg | ORAL_TABLET | Freq: Every day | ORAL | Status: DC
  Administered 2020-08-27 – 2020-08-29 (×3): 20 mg via ORAL
  Filled 2020-08-26 (×3): qty 1

## 2020-08-26 MED ORDER — OXYCODONE HCL 5 MG PO TABS: 5.0000 mg | ORAL_TABLET | Freq: Once | ORAL | Status: DC | PRN

## 2020-08-26 MED ORDER — ACETAMINOPHEN 10 MG/ML IV SOLN
INTRAVENOUS | Status: DC | PRN
  Administered 2020-08-26: 1000 mg via INTRAVENOUS

## 2020-08-26 MED ORDER — PROPOFOL 500 MG/50ML IV EMUL
INTRAVENOUS | Status: AC
  Filled 2020-08-26: qty 50

## 2020-08-26 MED ORDER — ACETAMINOPHEN 325 MG PO TABS
650.0000 mg | ORAL_TABLET | Freq: Once | ORAL | Status: AC
  Administered 2020-08-26: 01:00:00 650 mg via ORAL
  Filled 2020-08-26: qty 2

## 2020-08-26 MED ORDER — ONDANSETRON HCL 4 MG/2ML IJ SOLN
4.0000 mg | Freq: Four times a day (QID) | INTRAMUSCULAR | Status: DC | PRN
  Administered 2020-08-26: 10:00:00 4 mg via INTRAVENOUS
  Filled 2020-08-26: qty 2

## 2020-08-26 MED ORDER — FENTANYL CITRATE (PF) 100 MCG/2ML IJ SOLN
INTRAMUSCULAR | Status: DC | PRN
  Administered 2020-08-26 (×2): 50 ug via INTRAVENOUS

## 2020-08-26 MED ORDER — SEVOFLURANE IN SOLN
RESPIRATORY_TRACT | Status: AC
  Filled 2020-08-26: qty 250

## 2020-08-26 MED ORDER — ONDANSETRON HCL 4 MG/2ML IJ SOLN
INTRAMUSCULAR | Status: AC
  Filled 2020-08-26: qty 2

## 2020-08-26 MED ORDER — FENTANYL CITRATE (PF) 100 MCG/2ML IJ SOLN
INTRAMUSCULAR | Status: AC
  Filled 2020-08-26: qty 2

## 2020-08-26 MED ORDER — METOCLOPRAMIDE HCL 5 MG/ML IJ SOLN: 10.0000 mg | Freq: Three times a day (TID) | INTRAMUSCULAR | Status: DC

## 2020-08-26 MED ORDER — CHLORHEXIDINE GLUCONATE CLOTH 2 % EX PADS
6.0000 | MEDICATED_PAD | Freq: Every day | CUTANEOUS | Status: DC
  Administered 2020-08-26 – 2020-08-29 (×2): 6 via TOPICAL

## 2020-08-26 MED ORDER — ACETAMINOPHEN 10 MG/ML IV SOLN
INTRAVENOUS | Status: AC
  Filled 2020-08-26: qty 100

## 2020-08-26 MED ORDER — ACETAMINOPHEN 10 MG/ML IV SOLN: 1000.0000 mg | Freq: Once | INTRAVENOUS | Status: DC | PRN

## 2020-08-26 MED ORDER — BUPIVACAINE HCL 0.5 % IJ SOLN
INTRAMUSCULAR | Status: DC | PRN
  Administered 2020-08-26: 10 mL

## 2020-08-26 MED ORDER — BUPIVACAINE HCL (PF) 0.5 % IJ SOLN
INTRAMUSCULAR | Status: AC
  Filled 2020-08-26: qty 30

## 2020-08-26 MED ORDER — MIDAZOLAM HCL 2 MG/2ML IJ SOLN
INTRAMUSCULAR | Status: DC | PRN
  Administered 2020-08-26: 1 mg via INTRAVENOUS

## 2020-08-26 MED ORDER — NEOMYCIN-POLYMYXIN B GU 40-200000 IR SOLN
Status: DC | PRN
  Administered 2020-08-26: 2 mL

## 2020-08-26 MED ORDER — ONDANSETRON HCL 4 MG/2ML IJ SOLN
INTRAMUSCULAR | Status: DC | PRN
  Administered 2020-08-26: 4 mg via INTRAVENOUS

## 2020-08-26 MED ORDER — PROPOFOL 10 MG/ML IV BOLUS
INTRAVENOUS | Status: DC | PRN
  Administered 2020-08-26: 50 mg via INTRAVENOUS
  Administered 2020-08-26: 30 mg via INTRAVENOUS
  Administered 2020-08-26: 120 mg via INTRAVENOUS

## 2020-08-26 MED ORDER — LIDOCAINE HCL (CARDIAC) PF 100 MG/5ML IV SOSY
PREFILLED_SYRINGE | INTRAVENOUS | Status: DC | PRN
  Administered 2020-08-26: 80 mg via INTRATRACHEAL

## 2020-08-26 MED ORDER — EPOETIN ALFA 4000 UNIT/ML IJ SOLN
4000.0000 [IU] | INTRAMUSCULAR | Status: DC
  Administered 2020-08-28: 4000 [IU] via INTRAVENOUS
  Filled 2020-08-26 (×3): qty 1

## 2020-08-26 MED ORDER — OXYCODONE HCL 5 MG/5ML PO SOLN: 5.0000 mg | Freq: Once | ORAL | Status: DC | PRN

## 2020-08-26 SURGICAL SUPPLY — 49 items
BLADE MED AGGRESSIVE (BLADE) ×1 IMPLANT
BLADE OSC/SAGITTAL MD 5.5X18 (BLADE) ×1 IMPLANT
BLADE SURG 15 STRL LF DISP TIS (BLADE) ×2 IMPLANT
BLADE SURG 15 STRL SS (BLADE) ×6
BLADE SURG MINI STRL (BLADE) ×3 IMPLANT
BNDG CONFORM 2 STRL LF (GAUZE/BANDAGES/DRESSINGS) ×3 IMPLANT
BNDG ELASTIC 4X5.8 VLCR STR LF (GAUZE/BANDAGES/DRESSINGS) ×3 IMPLANT
BNDG ESMARK 4X12 TAN STRL LF (GAUZE/BANDAGES/DRESSINGS) ×3 IMPLANT
BNDG GAUZE 4.5X4.1 6PLY STRL (MISCELLANEOUS) ×3 IMPLANT
CANISTER SUCT 1200ML W/VALVE (MISCELLANEOUS) ×3 IMPLANT
CLOSURE WOUND 1/4X4 (GAUZE/BANDAGES/DRESSINGS) ×1
COVER WAND RF STERILE (DRAPES) ×3 IMPLANT
CUFF TOURN DUAL PL 12 NO SLV (MISCELLANEOUS) ×3 IMPLANT
CUFF TOURN SGL QUICK 18X4 (TOURNIQUET CUFF) ×3 IMPLANT
DRAPE FLUOR MINI C-ARM 54X84 (DRAPES) ×3 IMPLANT
DURAPREP 26ML APPLICATOR (WOUND CARE) ×3 IMPLANT
ELECT REM PT RETURN 9FT ADLT (ELECTROSURGICAL) ×3
ELECTRODE REM PT RTRN 9FT ADLT (ELECTROSURGICAL) ×1 IMPLANT
GAUZE SPONGE 4X4 12PLY STRL (GAUZE/BANDAGES/DRESSINGS) ×3 IMPLANT
GAUZE XEROFORM 1X8 LF (GAUZE/BANDAGES/DRESSINGS) ×3 IMPLANT
GLOVE BIO SURGEON STRL SZ7.5 (GLOVE) ×3 IMPLANT
GLOVE INDICATOR 8.0 STRL GRN (GLOVE) ×3 IMPLANT
GOWN STRL REUS W/ TWL LRG LVL3 (GOWN DISPOSABLE) ×2 IMPLANT
GOWN STRL REUS W/TWL LRG LVL3 (GOWN DISPOSABLE) ×6
HANDPIECE VERSAJET DEBRIDEMENT (MISCELLANEOUS) ×1 IMPLANT
KIT TURNOVER KIT A (KITS) ×3 IMPLANT
LABEL OR SOLS (LABEL) ×3 IMPLANT
MANIFOLD NEPTUNE II (INSTRUMENTS) ×3 IMPLANT
NDL FILTER BLUNT 18X1 1/2 (NEEDLE) ×1 IMPLANT
NDL HYPO 25X1 1.5 SAFETY (NEEDLE) ×2 IMPLANT
NEEDLE FILTER BLUNT 18X 1/2SAF (NEEDLE) ×2
NEEDLE FILTER BLUNT 18X1 1/2 (NEEDLE) ×1 IMPLANT
NEEDLE HYPO 25X1 1.5 SAFETY (NEEDLE) ×6 IMPLANT
NS IRRIG 500ML POUR BTL (IV SOLUTION) ×3 IMPLANT
PACK EXTREMITY ARMC (MISCELLANEOUS) ×3 IMPLANT
SOL .9 NS 3000ML IRR  AL (IV SOLUTION) ×3
SOL .9 NS 3000ML IRR UROMATIC (IV SOLUTION) ×1 IMPLANT
SOL PREP PVP 2OZ (MISCELLANEOUS) ×3
SOLUTION PREP PVP 2OZ (MISCELLANEOUS) ×1 IMPLANT
STOCKINETTE BIAS CUT 4 980044 (GAUZE/BANDAGES/DRESSINGS) ×3 IMPLANT
STOCKINETTE STRL 6IN 960660 (GAUZE/BANDAGES/DRESSINGS) ×3 IMPLANT
STRIP CLOSURE SKIN 1/4X4 (GAUZE/BANDAGES/DRESSINGS) ×2 IMPLANT
SUT ETHILON 3-0 FS-10 30 BLK (SUTURE) ×3
SUT ETHILON 4-0 (SUTURE)
SUT ETHILON 4-0 FS2 18XMFL BLK (SUTURE)
SUTURE EHLN 3-0 FS-10 30 BLK (SUTURE) ×1 IMPLANT
SUTURE ETHLN 4-0 FS2 18XMF BLK (SUTURE) IMPLANT
SWAB DUAL CULTURE TRANS RED ST (MISCELLANEOUS) ×3 IMPLANT
SYR 10ML LL (SYRINGE) ×3 IMPLANT

## 2020-08-26 NOTE — Interval H&P Note (Signed)
History and Physical Interval Note:  08/26/2020 4:28 PM  Steve Vance  has presented today for surgery, with the diagnosis of osteomyelitis.  The various methods of treatment have been discussed with the patient and family. After consideration of risks, benefits and other options for treatment, the patient has consented to  Procedure(s): AMPUTATION TOE - 3rd toe (Right) as a surgical intervention.  The patient's history has been reviewed, patient examined, no change in status, stable for surgery.  I have reviewed the patient's chart and labs.  Questions were answered to the patient's satisfaction.     Steve Vance

## 2020-08-26 NOTE — Transfer of Care (Signed)
Immediate Anesthesia Transfer of Care Note  Patient: Cuauhtemoc Huegel  Procedure(s) Performed: AMPUTATION TOE - 2rd toe (Right Toe)  Patient Location: PACU  Anesthesia Type:General  Level of Consciousness: drowsy  Airway & Oxygen Therapy: Patient Spontanous Breathing and Patient connected to face mask oxygen  Post-op Assessment: Report given to RN and Post -op Vital signs reviewed and stable  Post vital signs: Reviewed and stable  Last Vitals:  Vitals Value Taken Time  BP 159/78 08/26/20 1744  Temp    Pulse 91 08/26/20 1748  Resp 15 08/26/20 1748  SpO2 97 % 08/26/20 1748  Vitals shown include unvalidated device data.  Last Pain:  Vitals:   08/26/20 1606  TempSrc: Oral  PainSc:          Complications: No complications documented.

## 2020-08-26 NOTE — Progress Notes (Signed)
PHARMACY - PHYSICIAN COMMUNICATION CRITICAL VALUE ALERT - BLOOD CULTURE IDENTIFICATION (BCID)  Steve Vance is an 59 y.o. male who presented to [facility] on 08/25/2020 with a chief complaint of foot infection  Assessment:  Lab reports + BCID with staph sp.  MecA detected  Name of physician (or Provider) Contacted: X. Blount NP  Current antibiotics: Vancomycin, Flagyl, Rocephin  Changes to prescribed antibiotics recommended: no change Recommendations accepted by provider  Results for orders placed or performed during the hospital encounter of 08/25/20  Blood Culture ID Panel (Reflexed) (Collected: 08/25/2020  1:35 PM)  Result Value Ref Range   Enterococcus faecalis NOT DETECTED NOT DETECTED   Enterococcus Faecium NOT DETECTED NOT DETECTED   Listeria monocytogenes NOT DETECTED NOT DETECTED   Staphylococcus species DETECTED (A) NOT DETECTED   Staphylococcus aureus (BCID) DETECTED (A) NOT DETECTED   Staphylococcus epidermidis NOT DETECTED NOT DETECTED   Staphylococcus lugdunensis NOT DETECTED NOT DETECTED   Streptococcus species NOT DETECTED NOT DETECTED   Streptococcus agalactiae NOT DETECTED NOT DETECTED   Streptococcus pneumoniae NOT DETECTED NOT DETECTED   Streptococcus pyogenes NOT DETECTED NOT DETECTED   A.calcoaceticus-baumannii NOT DETECTED NOT DETECTED   Bacteroides fragilis NOT DETECTED NOT DETECTED   Enterobacterales NOT DETECTED NOT DETECTED   Enterobacter cloacae complex NOT DETECTED NOT DETECTED   Escherichia coli NOT DETECTED NOT DETECTED   Klebsiella aerogenes NOT DETECTED NOT DETECTED   Klebsiella oxytoca NOT DETECTED NOT DETECTED   Klebsiella pneumoniae NOT DETECTED NOT DETECTED   Proteus species NOT DETECTED NOT DETECTED   Salmonella species NOT DETECTED NOT DETECTED   Serratia marcescens NOT DETECTED NOT DETECTED   Haemophilus influenzae NOT DETECTED NOT DETECTED   Neisseria meningitidis NOT DETECTED NOT DETECTED   Pseudomonas aeruginosa NOT DETECTED NOT  DETECTED   Stenotrophomonas maltophilia NOT DETECTED NOT DETECTED   Candida albicans NOT DETECTED NOT DETECTED   Candida auris NOT DETECTED NOT DETECTED   Candida glabrata NOT DETECTED NOT DETECTED   Candida krusei NOT DETECTED NOT DETECTED   Candida parapsilosis NOT DETECTED NOT DETECTED   Candida tropicalis NOT DETECTED NOT DETECTED   Cryptococcus neoformans/gattii NOT DETECTED NOT DETECTED   Meth resistant mecA/C and MREJ DETECTED (A) NOT DETECTED    [name] A 08/26/2020  3:12 AM
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
## 2020-08-26 NOTE — Progress Notes (Signed)
Pharmacy Antibiotic Note  Steve Vance is a 59 y.o. male admitted on 08/25/2020 with suspected diabetic foot infection and possible osteomyelitis of second toe of right foot. Patient has past medication history of ESRD (HD Tu,Thurs, Sat), HTN, Uncontrolled DM, and obesity. Pharmacy has been consulted for Vancomycin dosing. Patient is also receiving ceftriaxone and Flagyl. Patient received vancomycin 2000mg  loading dose in ED.    Per nephrology note for today, patient will not have dialysis today but tentative plan for tomorrow.   Plan:  Will order Vancomycin 1000mg  IV to be given with HD on HD days (tomorrow). Will plan to draw vanc random level after 2nd or 3rd HD session. Target Vancomycin level 15-25 mcg/mL    Height: 5\' 11"  (180.3 cm) Weight: 115.2 kg (254 lb) IBW/kg (Calculated) : 75.3  Temp (24hrs), Avg:99.9 F (37.7 C), Min:98.4 F (36.9 C), Max:103 F (39.4 C)  Recent Labs  Lab 08/25/20 1335  WBC 8.7  CREATININE 9.01*  LATICACIDVEN 1.1    Estimated Creatinine Clearance: 11.1 mL/min (A) (by C-G formula based on SCr of 9.01 mg/dL (H)).    Allergies  Allergen Reactions  . Baclofen Nausea And Vomiting  . Gabapentin Nausea And Vomiting    Antimicrobials this admission: 12/20 ceftriaxone >> 12/20 vancomycin >>  12/20 Flagyl>>   Microbiology results: 12/20 BCx: pending  Thank you for allowing pharmacy to be a part of this patient's care.  Rowland Lathe, PharmD Clinical Pharmacist 08/26/2020 12:40 PM
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
## 2020-08-26 NOTE — Progress Notes (Signed)
[facility] [place], [place] 08/26/20  Subjective:   LOS: 1  Patient known to our practice from outpatient dialysis Last HD was on December 18.  Target weight was achieved Patient presents for right-sided ankle and knee pain.  Noted to have discoloration and sore on his right second toe.  X-ray suggests osteomyelitis.  Plan for surgery today C/o nausea and vomiting Was able to eat small dinner last night No SOB   Objective:  Vital signs in last 24 hours:  Temp:  [98.4 F (36.9 C)-103 F (39.4 C)] 99.2 F (37.3 C) (12/21 0717) Pulse Rate:  [85-109] 91 (12/21 0717) Resp:  [16-22] 16 (12/21 0717) BP: (149-210)/(68-123) 172/87 (12/21 0717) SpO2:  [91 %-100 %] 91 % (12/21 0717) Weight:  [115.2 kg] 115.2 kg (12/20 1101)  Weight change:  Filed Weights   08/25/20 1101  Weight: 115.2 kg    Intake/Output:    Intake/Output Summary (Last 24 hours) at 08/26/2020 1051 Last data filed at 08/25/2020 2336 Gross per 24 hour  Intake 3180.99 ml  Output 250 ml  Net 2930.99 ml     Physical Exam: General:  No acute distress,    HEENT  anicteric, moist oral mucous membranes  Pulm/lungs  normal breathing effort, room air  CVS/Heart  regular, no rub  Abdomen:   Soft, nontender  Extremities:  Trace to 1+ edema, right second toe infection  Neurologic:  Alert, able to answer questions appropriate  Skin:  no acute rashes  Access:  Left arm AV fistula       Basic Metabolic Panel:  Recent Labs  Lab 08/25/20 1335  NA 132*  K 4.2  CL 93*  CO2 23  GLUCOSE 100*  BUN 33*  CREATININE 9.01*  CALCIUM 9.7     CBC: Recent Labs  Lab 08/25/20 1335  WBC 8.7  NEUTROABS 7.0  HGB 10.8*  HCT 33.3*  MCV 87.2  PLT 272      Lab Results  Component Value Date   HEPBSAG  04/02/2019    THIS TEST WAS ORDERED IN ERROR AND HAS BEEN CREDITED.   HEPBSAB Non Reactive 04/02/2019   HEPBIGM Negative 04/02/2019      Microbiology:  Recent Results (from the past 240  hour(s))  Resp Panel by RT-PCR (Flu A&B, Covid) Nasopharyngeal Swab     Status: None   Collection Time: 08/25/20 12:29 PM   Specimen: Nasopharyngeal Swab; Nasopharyngeal(NP) swabs in vial transport medium  Result Value Ref Range Status   SARS Coronavirus 2 by RT PCR NEGATIVE NEGATIVE Final    Comment: (NOTE) SARS-CoV-2 target nucleic acids are NOT DETECTED.  The SARS-CoV-2 RNA is generally detectable in upper respiratory specimens during the acute phase of infection. The lowest concentration of SARS-CoV-2 viral copies this assay can detect is 138 copies/mL. A negative result does not preclude SARS-Cov-2 infection and should not be used as the sole basis for treatment or other patient management decisions. A negative result may occur with  improper specimen collection/handling, submission of specimen other than nasopharyngeal swab, presence of viral mutation(s) within the areas targeted by this assay, and inadequate number of viral copies(<138 copies/mL). A negative result must be combined with clinical observations, patient history, and epidemiological information. The expected result is Negative.  Fact Sheet for Patients:  EntrepreneurPulse.com.au  Fact Sheet for Healthcare Providers:  IncredibleEmployment.be  This test is no t yet approved or cleared by the [place] FDA and  has been authorized for detection and/or diagnosis of SARS-CoV-2 by  FDA under an Emergency Use Authorization (EUA). This EUA will remain  in effect (meaning this test can be used) for the duration of the COVID-19 declaration under Section 564(b)(1) of the Act, 21 U.S.C.section 360bbb-3(b)(1), unless the authorization is terminated  or revoked sooner.       Influenza A by PCR NEGATIVE NEGATIVE Final   Influenza B by PCR NEGATIVE NEGATIVE Final    Comment: (NOTE) The Xpert Xpress SARS-CoV-2/FLU/RSV plus assay is intended as an aid in the diagnosis of influenza from  Nasopharyngeal swab specimens and should not be used as a sole basis for treatment. Nasal washings and aspirates are unacceptable for Xpert Xpress SARS-CoV-2/FLU/RSV testing.  Fact Sheet for Patients: EntrepreneurPulse.com.au  Fact Sheet for Healthcare Providers: IncredibleEmployment.be  This test is not yet approved or cleared by the [place] FDA and has been authorized for detection and/or diagnosis of SARS-CoV-2 by FDA under an Emergency Use Authorization (EUA). This EUA will remain in effect (meaning this test can be used) for the duration of the COVID-19 declaration under Section 564(b)(1) of the Act, 21 U.S.C. section 360bbb-3(b)(1), unless the authorization is terminated or revoked.  Performed at [facility], [address] [place], [place] [address]   Culture, blood (routine x 2)     Status: None (Preliminary result)   Collection Time: 08/25/20  1:35 PM   Specimen: BLOOD  Result Value Ref Range Status   Specimen Description BLOOD BLOOD RIGHT FOREARM  Final   Special Requests   Final    BOTTLES DRAWN AEROBIC AND ANAEROBIC Blood Culture results may not be optimal due to an excessive volume of blood received in culture bottles   Culture  Setup Time   Final    GRAM POSITIVE COCCI IN BOTH AEROBIC AND ANAEROBIC BOTTLES Organism ID to follow CRITICAL RESULT CALLED TO, READ BACK BY AND VERIFIED WITH: [place] $RemoveBef'[email or web address]'[email or web address]$  08/26/20 RH Performed at [facility], [address], [place], [place] [address]    Culture GRAM POSITIVE COCCI  Final   Report Status PENDING  Incomplete  Culture, blood (routine x 2)     Status: None (Preliminary result)   Collection Time: 08/25/20  1:35 PM   Specimen: BLOOD  Result Value Ref Range Status   Specimen Description BLOOD BLOOD RIGHT HAND  Final   Special Requests   Final    BOTTLES DRAWN AEROBIC AND ANAEROBIC Blood Culture adequate volume   Culture   Final    NO GROWTH < 24  HOURS Performed at [facility], [address] [place], [place] [address]    Report Status PENDING  Incomplete  Blood Culture ID Panel (Reflexed)     Status: Abnormal   Collection Time: 08/25/20  1:35 PM  Result Value Ref Range Status   Enterococcus faecalis NOT DETECTED NOT DETECTED Final   Enterococcus Faecium NOT DETECTED NOT DETECTED Final   Listeria monocytogenes NOT DETECTED NOT DETECTED Final   Staphylococcus species DETECTED (A) NOT DETECTED Final    Comment: CRITICAL RESULT CALLED TO, READ BACK BY AND VERIFIED WITH: SCOTT HALL $RemoveBe'@0250'[email or web address]$  08/26/20 RH    Staphylococcus aureus (BCID) DETECTED (A) NOT DETECTED Final    Comment: Methicillin (oxacillin)-resistant Staphylococcus aureus (MRSA). MRSA is predictably resistant to beta-lactam antibiotics (except ceftaroline). Preferred therapy is vancomycin unless clinically contraindicated. Patient requires contact precautions if  hospitalized. CRITICAL RESULT CALLED TO, READ BACK BY AND VERIFIED WITH: SCOTT HALL $RemoveBe'@0250'[email or web address]$  08/26/20 RH    Staphylococcus epidermidis NOT DETECTED NOT DETECTED Final  Staphylococcus lugdunensis NOT DETECTED NOT DETECTED Final   Streptococcus species NOT DETECTED NOT DETECTED Final   Streptococcus agalactiae NOT DETECTED NOT DETECTED Final   Streptococcus pneumoniae NOT DETECTED NOT DETECTED Final   Streptococcus pyogenes NOT DETECTED NOT DETECTED Final   A.calcoaceticus-baumannii NOT DETECTED NOT DETECTED Final   Bacteroides fragilis NOT DETECTED NOT DETECTED Final   Enterobacterales NOT DETECTED NOT DETECTED Final   Enterobacter cloacae complex NOT DETECTED NOT DETECTED Final   Escherichia coli NOT DETECTED NOT DETECTED Final   Klebsiella aerogenes NOT DETECTED NOT DETECTED Final   Klebsiella oxytoca NOT DETECTED NOT DETECTED Final   Klebsiella pneumoniae NOT DETECTED NOT DETECTED Final   Proteus species NOT DETECTED NOT DETECTED Final   Salmonella species NOT DETECTED NOT DETECTED Final   Serratia  marcescens NOT DETECTED NOT DETECTED Final   Haemophilus influenzae NOT DETECTED NOT DETECTED Final   Neisseria meningitidis NOT DETECTED NOT DETECTED Final   Pseudomonas aeruginosa NOT DETECTED NOT DETECTED Final   Stenotrophomonas maltophilia NOT DETECTED NOT DETECTED Final   Candida albicans NOT DETECTED NOT DETECTED Final   Candida auris NOT DETECTED NOT DETECTED Final   Candida glabrata NOT DETECTED NOT DETECTED Final   Candida krusei NOT DETECTED NOT DETECTED Final   Candida parapsilosis NOT DETECTED NOT DETECTED Final   Candida tropicalis NOT DETECTED NOT DETECTED Final   Cryptococcus neoformans/gattii NOT DETECTED NOT DETECTED Final   Meth resistant mecA/C and MREJ DETECTED (A) NOT DETECTED Final    Comment: CRITICAL RESULT CALLED TO, READ BACK BY AND VERIFIED WITH: SCOTT HALL $RemoveBe'@0250'[email or web address]$  08/26/20 RH Performed at [facility], [address] [place], [place] [address]   Surgical PCR screen     Status: Abnormal   Collection Time: 08/26/20  5:27 AM   Specimen: Nasal Mucosa; Nasal Swab  Result Value Ref Range Status   MRSA, PCR POSITIVE (A) NEGATIVE Final    Comment: RESULT CALLED TO, READ BACK BY AND VERIFIED WITH: CANDACE SUMMERS AT 9628 08/26/20 SDR    Staphylococcus aureus POSITIVE (A) NEGATIVE Final    Comment: (NOTE) The Xpert SA Assay (FDA approved for NASAL specimens in patients 74 years of age and older), is one component of a comprehensive surveillance program. It is not intended to diagnose infection nor to guide or monitor treatment. Performed at [facility], [address] [place], [place] [address]     Coagulation Studies: No results for input(s): LABPROT, INR in the last 72 hours.  Urinalysis: Recent Labs    08/25/20 1335  COLORURINE AMBER*  LABSPEC 1.009  PHURINE 8.0  GLUCOSEU 50*  HGBUR LARGE*  BILIRUBINUR NEGATIVE  KETONESUR NEGATIVE  PROTEINUR >=300*  NITRITE NEGATIVE  LEUKOCYTESUR TRACE*      Imaging: MRI Right foot  without contrast  Result Date: 08/25/2020 CLINICAL DATA:  Xray Erosive changes in the second distal phalanx consistent with osteomyelitis EXAM: MRI OF THE RIGHT FOREFOOT WITHOUT CONTRAST TECHNIQUE: Multiplanar, multisequence MR imaging of the right was performed. No intravenous contrast was administered. COMPARISON:  X-ray right foot 08/25/2020. FINDINGS: Bones/Joint/Cartilage Destruction of the majority of the second digit distal phalanx with associated hypointensity of the base of the second digit distal phalanx, as well as of the second digit middle phalanx, and likely the head of the second digit proximal phalanx on on T1 sequence with associated hyperintensity on STIR imaging. Destruction of the majority of the fifth digit middle and distal phalanges with associated hypointensity of the head/neck region of the proximal phalanx and of  the remaining middle and distal phalanges on T1 sequence. These bones are not well visualized on the T2 and STIR sequences. Degenerative changes of the first metatarsal interphalangeal joint. Ligaments Unremarkable. Muscles and Tendons No associated intramuscular abscess formation. Soft tissues Overlying soft tissue defect of the distal second digit. Dermal thickening of the second digit. Associated subcutaneus tissue the dorsal foot. IMPRESSION: 1. Osteomyelitis of the second digit proximal, middle, and likely distal phalanges. 2. Possible osteomyelitis of the fifth digit proximal, middle, and distal phalanges. Electronically Signed   By: Iven Finn M.D.   On: 08/25/2020 22:30   DG Chest Portable 1 View  Result Date: 08/25/2020 CLINICAL DATA:  Cough EXAM: PORTABLE CHEST 1 VIEW COMPARISON:  03/29/2019 FINDINGS: Cardiac shadow is at the upper limits of normal in size. Mild vascular congestion is noted without interstitial edema. No focal infiltrate or sizable effusion is seen. No bony abnormality is noted. IMPRESSION: Mild vascular congestion without edema or focal  infiltrate. Electronically Signed   By: Inez Catalina M.D.   On: 08/25/2020 12:36   DG Foot Complete Right  Result Date: 08/25/2020 CLINICAL DATA:  Second toe wound with pain and history of gout, initial encounter EXAM: RIGHT FOOT COMPLETE - 3+ VIEW COMPARISON:  None. FINDINGS: Soft tissue wound is noted in the distal aspect of the second toe. Resorption of the second distal phalanx is noted consistent with osteomyelitis. Resorptive changes in the fifth digit are noted likely of a chronic nature given its appearance. Diffuse vascular calcifications are seen. Mild soft tissue swelling is noted. No fracture or dislocation is seen. IMPRESSION: Erosive changes in the second distal phalanx consistent with osteomyelitis. Electronically Signed   By: Inez Catalina M.D.   On: 08/25/2020 12:39     Medications:    sodium chloride 10 mL/hr at 08/25/20 1829   cefTRIAXone (ROCEPHIN)  IV 2 g (08/25/20 2224)    amLODipine  10 mg Oral Daily   atorvastatin  10 mg Oral QPM   calcium acetate  1,334 mg Oral TID WC   Chlorhexidine Gluconate Cloth  6 each Topical Daily   insulin aspart  0-6 Units Subcutaneous TID WC   labetalol  200 mg Oral BID   losartan  50 mg Oral Daily   metroNIDAZOLE  500 mg Oral Q8H   mupirocin ointment  1 application Nasal BID   sertraline  100 mg Oral Daily   vancomycin variable dose per unstable renal function (pharmacist dosing)   Does not apply See admin instructions   cyclobenzaprine, HYDROcodone-acetaminophen, ondansetron (ZOFRAN) IV  Assessment/ Plan:  59 y.o. male with end-stage renal disease, diabetes, hypertension, chronic back pain was admitted on 08/25/2020 for  Principal Problem:   Osteomyelitis of second toe of right foot ([place]) Active Problems:   Poorly controlled type 2 diabetes mellitus ([place])   Essential hypertension   Obesity (BMI 30.0-34.9)   ESRD on dialysis ([place])   Systemic inflammatory response syndrome (SIRS) without organ dysfunction  ([place])  Osteomyelitis of second toe of right foot ([place]) [M86.9] Sepsis, due to unspecified organism, unspecified whether acute organ dysfunction present ([place]) [A41.9]  #. ESRD [facility]/TTS/left arm AV fistula/255 minutes / 116 kg First day of dialysis April 02, 2019 Electrolytes and volume status are acceptable Dialysis deferred to tomorrow in anticipation of surgery today Patient is to undergo Surgery and possible amputation of his right second toe. Plan for dialysis on Wednesday  #. Anemia of CKD  Lab Results  Component Value Date   HGB  10.8 (L) 08/25/2020   Low dose EPO with HD  #. Secondary hyperparathyroidism of renal origin N 25.81      Component Value Date/Time   PTH 152 (H) 04/02/2019 2023   Lab Results  Component Value Date   PHOS 4.0 04/05/2019   Monitor calcium and phos level during this admission   #. Diabetes type 2 with CKD, peripheral neuropathy and peripheral rash Hgb A1c MFr Bld (%)  Date Value  08/25/2020 5.8 (H)   #Right second toe osteomyelitis Amputation and debridement planned for 08/26/2020 Broad-spectrum antibiotics including IV Rocephin, metronidazole, IV vancomycin   LOS: [place] 12/21/202110:51 AM  [place], [place]

## 2020-08-26 NOTE — Anesthesia Postprocedure Evaluation (Signed)
Anesthesia Post Note  Patient: Steve Vance  Procedure(s) Performed: AMPUTATION TOE - 2rd toe (Right Toe)  Patient location during evaluation: PACU Anesthesia Type: General Level of consciousness: awake and alert Pain management: pain level controlled Vital Signs Assessment: post-procedure vital signs reviewed and stable Respiratory status: spontaneous breathing, nonlabored ventilation, respiratory function stable and patient connected to nasal cannula oxygen Cardiovascular status: blood pressure returned to baseline and stable Postop Assessment: no apparent nausea or vomiting Anesthetic complications: no   No complications documented.   Last Vitals:  Vitals:   08/26/20 1800 08/26/20 1814  BP: (!) 148/79 (!) 152/78  Pulse: 92 96  Resp: 18 (!) 21  Temp:  37.2 C  SpO2: 98% 93%    Last Pain:  Vitals:   08/26/20 1814  TempSrc:   PainSc: 0-No pain                 Arita Miss

## 2020-08-26 NOTE — Progress Notes (Addendum)
PROGRESS NOTE    Steve Vance  JQB:[id number] DOB: 10-16-1960 DOA: 08/25/2020 PCP: Theotis Burrow, MD   Chief Complain: Right foot pain  Brief Narrative: Patient is a 59 year old male with history of ESRD on dialysis on TTS, obesity, diabetes type 2, hypertension who presents to the emergency department complaints of right foot pain.  No history of recent trauma.  On presentation, he was found to have ulcer on the second right toe with ischemic changes.  Right foot x-ray showed erosive changes in the second distal phalanx consistent with osteomyelitis.  Podiatry consulted.  Started on broad spectrum antibiotics.  Cultures sent.  Plan for amputation of right second toe today.  Nephrology also consulted for dialysis.  ID consulted because of finding of MRSA bacteremia  Assessment & Plan:   Principal Problem:   Osteomyelitis of second toe of right foot ([place]) Active Problems:   Poorly controlled type 2 diabetes mellitus ([place])   Essential hypertension   Obesity (BMI 30.0-34.9)   ESRD on dialysis ([place])   Systemic inflammatory response syndrome (SIRS) without organ dysfunction ([facility])   Osteomyelitis of the second toe of the right foot: Presented with right foot pain.  Found to have ulcer on the second right toe with ischemic changes.  X-ray showed effusions in the second distal phalanx consistent with osteomyelitis.  Started on broad start antibiotics.  Culture sent.  Podiatry planning for ambulation of right second toe today. I will also get ABI of bilateral lower extremity to rule out peripheral vascular disease. He will need physical therapy assessment after the surgery.  MRSA bacteremia: Presented with fever of 103, tachypnea, tachypnea.  Normal lactate level, no leukocytosis.  1 set of blood cultures showing MRSA on both aerobic and anaerobic bottles.  Currently afebrile and hemodynamically stable.  Will involve ID.  Further recommendation as per ID.  He may need TTE/TEE depending  upon how the culture turns out.  ESRD on hemodialysis: Dialyzed on TTS.  Nephrology already following.  Plan for dialysis on Wednesday.  He has a fistula in the left arm.  Chest x-ray on presentation showed mild vascular congestion without edema.  Volume management as per dialysis Continue phosphate binders.  Hypertension: Remains mildly hypertensive.  Continue current regimen.  Monitor blood pressure  Diabetes type 2: Continue current insulin regimen with sliding scale.  Pretty well controlled diabetes with hemoglobin A1c of 5.8.  Monitor blood sugars.  Currently on diet control, not taking any medications at home  Depression: On Sertraline  Hyperlipidemia: On  Lipitor  Nausea and vomiting: New problem.  Continue antiemetics as needed.  Morbid obesity: BMI of 35.4          DVT prophylaxis:Heparin  Code Status: Full Family Communication: None at bed side Status is: Inpatient   Dispo: The patient is from: Home              Anticipated d/c is to: Home              Anticipated d/c date is: 2 days              Patient currently is not medically stable to d/c.   Consultants: Podiatry,nephrology,ID  Procedures:None  Antimicrobials:  Anti-infectives (From admission, onward)   Start     Dose/Rate Route Frequency Ordered Stop   08/26/20 1200  vancomycin (VANCOCIN) IVPB 1000 mg/200 mL premix        1,000 mg 200 mL/hr over 60 Minutes Intravenous Every T-Th-Sa (Hemodialysis) 08/25/20 1541  08/25/20 1800  cefTRIAXone (ROCEPHIN) 2 g in sodium chloride 0.9 % 100 mL IVPB        2 g 200 mL/hr over 30 Minutes Intravenous Every 24 hours 08/25/20 1517     08/25/20 1545  vancomycin (VANCOREADY) IVPB 500 mg/100 mL        500 mg 100 mL/hr over 60 Minutes Intravenous  Once 08/25/20 1540 08/25/20 1936   08/25/20 1530  metroNIDAZOLE (FLAGYL) tablet 500 mg        500 mg Oral Every 8 hours 08/25/20 1517     08/25/20 1330  vancomycin (VANCOREADY) IVPB 2000 mg/400 mL        2,000 mg 200  mL/hr over 120 Minutes Intravenous  Once 08/25/20 1328 08/25/20 1720   08/25/20 1330  ceFAZolin (ANCEF) IVPB 2g/100 mL premix        2 g 200 mL/hr over 30 Minutes Intravenous  Once 08/25/20 1328 08/25/20 1453      Subjective:  Patient seen and examined the bedside this morning.  Hemodynamically stable during my evaluation, mild hypertensive.  Complains of severe pain on the right foot.  His right foot was covered with dressing.  I was notified later on that he developed nausea and vomiting.  Objective: Vitals:   08/25/20 2318 08/26/20 0056 08/26/20 0442 08/26/20 0717  BP: (!) 174/73  (!) 149/82 (!) 172/87  Pulse: (!) 101  85 91  Resp: $Remo'16  16 16  'OyWns$ Temp: 100.2 F (37.9 C) 100.3 F (37.9 C) 98.4 F (36.9 C) 99.2 F (37.3 C)  TempSrc:  Oral  Oral  SpO2: 100%  95% 91%  Weight:      Height:        Intake/Output Summary (Last 24 hours) at 08/26/2020 0748 Last data filed at 08/25/2020 2336 Gross per 24 hour  Intake 3180.99 ml  Output 250 ml  Net 2930.99 ml   Filed Weights   08/25/20 1101  Weight: 115.2 kg    Examination:  General exam: Appears calm and comfortable ,Not in distress,obese HEENT:PERRL,Oral mucosa moist, Ear/Nose normal on gross exam Respiratory system: Bilateral equal air entry, normal vesicular breath sounds, no wheezes or crackles  Cardiovascular system: S1 & S2 heard, RRR. No JVD, murmurs, rubs, gallops or clicks. No pedal edema. Gastrointestinal system: Abdomen is nondistended, soft and nontender. No organomegaly or masses felt. Normal bowel sounds heard. Central nervous system: Alert and oriented. No focal neurological deficits. Extremities: No edema, no clubbing ,no cyanosis,   Right foot wrapped with dressing Skin: No rashes,,no icterus ,no pallor     Data Reviewed: I have personally reviewed following labs and imaging studies  CBC: Recent Labs  Lab 08/25/20 1335  WBC 8.7  NEUTROABS 7.0  HGB 10.8*  HCT 33.3*  MCV 87.2  PLT 409   Basic  Metabolic Panel: Recent Labs  Lab 08/25/20 1335  NA 132*  K 4.2  CL 93*  CO2 23  GLUCOSE 100*  BUN 33*  CREATININE 9.01*  CALCIUM 9.7   GFR: Estimated Creatinine Clearance: 11.1 mL/min (A) (by C-G formula based on SCr of 9.01 mg/dL (H)). Liver Function Tests: Recent Labs  Lab 08/25/20 1335  AST 15  ALT 13  ALKPHOS 51  BILITOT 0.9  PROT 9.3*  ALBUMIN 3.9   No results for input(s): LIPASE, AMYLASE in the last 168 hours. No results for input(s): AMMONIA in the last 168 hours. Coagulation Profile: No results for input(s): INR, PROTIME in the last 168 hours. Cardiac Enzymes: No results for  input(s): CKTOTAL, CKMB, CKMBINDEX, TROPONINI in the last 168 hours. BNP (last 3 results) No results for input(s): PROBNP in the last 8760 hours. HbA1C: Recent Labs    08/25/20 1406  HGBA1C 5.8*   CBG: Recent Labs  Lab 08/25/20 1756 08/25/20 2110 08/26/20 0724  GLUCAP 78 80 84   Lipid Profile: No results for input(s): CHOL, HDL, LDLCALC, TRIG, CHOLHDL, LDLDIRECT in the last 72 hours. Thyroid Function Tests: No results for input(s): TSH, T4TOTAL, FREET4, T3FREE, THYROIDAB in the last 72 hours. Anemia Panel: No results for input(s): VITAMINB12, FOLATE, FERRITIN, TIBC, IRON, RETICCTPCT in the last 72 hours. Sepsis Labs: Recent Labs  Lab 08/25/20 1335  LATICACIDVEN 1.1    Recent Results (from the past 240 hour(s))  Resp Panel by RT-PCR (Flu A&B, Covid) Nasopharyngeal Swab     Status: None   Collection Time: 08/25/20 12:29 PM   Specimen: Nasopharyngeal Swab; Nasopharyngeal(NP) swabs in vial transport medium  Result Value Ref Range Status   SARS Coronavirus 2 by RT PCR NEGATIVE NEGATIVE Final    Comment: (NOTE) SARS-CoV-2 target nucleic acids are NOT DETECTED.  The SARS-CoV-2 RNA is generally detectable in upper respiratory specimens during the acute phase of infection. The lowest concentration of SARS-CoV-2 viral copies this assay can detect is 138 copies/mL. A  negative result does not preclude SARS-Cov-2 infection and should not be used as the sole basis for treatment or other patient management decisions. A negative result may occur with  improper specimen collection/handling, submission of specimen other than nasopharyngeal swab, presence of viral mutation(s) within the areas targeted by this assay, and inadequate number of viral copies(<138 copies/mL). A negative result must be combined with clinical observations, patient history, and epidemiological information. The expected result is Negative.  Fact Sheet for Patients:  BloggerCourse.com  Fact Sheet for Healthcare Providers:  SeriousBroker.it  This test is no t yet approved or cleared by the [place] FDA and  has been authorized for detection and/or diagnosis of SARS-CoV-2 by FDA under an Emergency Use Authorization (EUA). This EUA will remain  in effect (meaning this test can be used) for the duration of the COVID-19 declaration under Section 564(b)(1) of the Act, 21 U.S.C.section 360bbb-3(b)(1), unless the authorization is terminated  or revoked sooner.       Influenza A by PCR NEGATIVE NEGATIVE Final   Influenza B by PCR NEGATIVE NEGATIVE Final    Comment: (NOTE) The Xpert Xpress SARS-CoV-2/FLU/RSV plus assay is intended as an aid in the diagnosis of influenza from Nasopharyngeal swab specimens and should not be used as a sole basis for treatment. Nasal washings and aspirates are unacceptable for Xpert Xpress SARS-CoV-2/FLU/RSV testing.  Fact Sheet for Patients: BloggerCourse.com  Fact Sheet for Healthcare Providers: SeriousBroker.it  This test is not yet approved or cleared by the [place] FDA and has been authorized for detection and/or diagnosis of SARS-CoV-2 by FDA under an Emergency Use Authorization (EUA). This EUA will remain in effect (meaning this test can  be used) for the duration of the COVID-19 declaration under Section 564(b)(1) of the Act, 21 U.S.C. section 360bbb-3(b)(1), unless the authorization is terminated or revoked.  Performed at [facility], [address] [place], [place] [address]   Culture, blood (routine x 2)     Status: None (Preliminary result)   Collection Time: 08/25/20  1:35 PM   Specimen: BLOOD  Result Value Ref Range Status   Specimen Description BLOOD BLOOD RIGHT FOREARM  Final   Special Requests  Final    BOTTLES DRAWN AEROBIC AND ANAEROBIC Blood Culture results may not be optimal due to an excessive volume of blood received in culture bottles   Culture  Setup Time   Final    GRAM POSITIVE COCCI IN BOTH AEROBIC AND ANAEROBIC BOTTLES Organism ID to follow CRITICAL RESULT CALLED TO, READ BACK BY AND VERIFIED WITH: SCOTT HALL $RemoveBe'@0250'[id number]$  08/26/20 RH Performed at [facility], [address], [place], [place] [address]    Culture GRAM POSITIVE COCCI  Final   Report Status PENDING  Incomplete  Culture, blood (routine x 2)     Status: None (Preliminary result)   Collection Time: 08/25/20  1:35 PM   Specimen: BLOOD  Result Value Ref Range Status   Specimen Description BLOOD BLOOD RIGHT HAND  Final   Special Requests   Final    BOTTLES DRAWN AEROBIC AND ANAEROBIC Blood Culture adequate volume   Culture   Final    NO GROWTH < 24 HOURS Performed at [facility], [address] [place], [place] [address]    Report Status PENDING  Incomplete  Blood Culture ID Panel (Reflexed)     Status: Abnormal   Collection Time: 08/25/20  1:35 PM  Result Value Ref Range Status   Enterococcus faecalis NOT DETECTED NOT DETECTED Final   Enterococcus Faecium NOT DETECTED NOT DETECTED Final   Listeria monocytogenes NOT DETECTED NOT DETECTED Final   Staphylococcus species DETECTED (A) NOT DETECTED Final    Comment: CRITICAL RESULT CALLED TO, READ BACK BY AND VERIFIED WITH: SCOTT HALL $RemoveBe'@0250'RvpVerTIF$  08/26/20  RH    Staphylococcus aureus (BCID) DETECTED (A) NOT DETECTED Final    Comment: Methicillin (oxacillin)-resistant Staphylococcus aureus (MRSA). MRSA is predictably resistant to beta-lactam antibiotics (except ceftaroline). Preferred therapy is vancomycin unless clinically contraindicated. Patient requires contact precautions if  hospitalized. CRITICAL RESULT CALLED TO, READ BACK BY AND VERIFIED WITH: SCOTT HALL $RemoveBe'@0250'[id number]$  08/26/20 RH    Staphylococcus epidermidis NOT DETECTED NOT DETECTED Final   Staphylococcus lugdunensis NOT DETECTED NOT DETECTED Final   Streptococcus species NOT DETECTED NOT DETECTED Final   Streptococcus agalactiae NOT DETECTED NOT DETECTED Final   Streptococcus pneumoniae NOT DETECTED NOT DETECTED Final   Streptococcus pyogenes NOT DETECTED NOT DETECTED Final   A.calcoaceticus-baumannii NOT DETECTED NOT DETECTED Final   Bacteroides fragilis NOT DETECTED NOT DETECTED Final   Enterobacterales NOT DETECTED NOT DETECTED Final   Enterobacter cloacae complex NOT DETECTED NOT DETECTED Final   Escherichia coli NOT DETECTED NOT DETECTED Final   Klebsiella aerogenes NOT DETECTED NOT DETECTED Final   Klebsiella oxytoca NOT DETECTED NOT DETECTED Final   Klebsiella pneumoniae NOT DETECTED NOT DETECTED Final   Proteus species NOT DETECTED NOT DETECTED Final   Salmonella species NOT DETECTED NOT DETECTED Final   Serratia marcescens NOT DETECTED NOT DETECTED Final   Haemophilus influenzae NOT DETECTED NOT DETECTED Final   Neisseria meningitidis NOT DETECTED NOT DETECTED Final   Pseudomonas aeruginosa NOT DETECTED NOT DETECTED Final   Stenotrophomonas maltophilia NOT DETECTED NOT DETECTED Final   Candida albicans NOT DETECTED NOT DETECTED Final   Candida auris NOT DETECTED NOT DETECTED Final   Candida glabrata NOT DETECTED NOT DETECTED Final   Candida krusei NOT DETECTED NOT DETECTED Final   Candida parapsilosis NOT DETECTED NOT DETECTED Final   Candida tropicalis NOT DETECTED NOT  DETECTED Final   Cryptococcus neoformans/gattii NOT DETECTED NOT DETECTED Final   Meth resistant mecA/C and MREJ DETECTED (A) NOT DETECTED Final    Comment: CRITICAL  RESULT CALLED TO, READ BACK BY AND VERIFIED WITH: SCOTT HALL $RemoveBe'@0250'[id number]$  08/26/20 RH Performed at [facility], [address] [place], [place] [address]          Radiology Studies: MRI Right foot without contrast  Result Date: 08/25/2020 CLINICAL DATA:  Xray Erosive changes in the second distal phalanx consistent with osteomyelitis EXAM: MRI OF THE RIGHT FOREFOOT WITHOUT CONTRAST TECHNIQUE: Multiplanar, multisequence MR imaging of the right was performed. No intravenous contrast was administered. COMPARISON:  X-ray right foot 08/25/2020. FINDINGS: Bones/Joint/Cartilage Destruction of the majority of the second digit distal phalanx with associated hypointensity of the base of the second digit distal phalanx, as well as of the second digit middle phalanx, and likely the head of the second digit proximal phalanx on on T1 sequence with associated hyperintensity on STIR imaging. Destruction of the majority of the fifth digit middle and distal phalanges with associated hypointensity of the head/neck region of the proximal phalanx and of the remaining middle and distal phalanges on T1 sequence. These bones are not well visualized on the T2 and STIR sequences. Degenerative changes of the first metatarsal interphalangeal joint. Ligaments Unremarkable. Muscles and Tendons No associated intramuscular abscess formation. Soft tissues Overlying soft tissue defect of the distal second digit. Dermal thickening of the second digit. Associated subcutaneus tissue the dorsal foot. IMPRESSION: 1. Osteomyelitis of the second digit proximal, middle, and likely distal phalanges. 2. Possible osteomyelitis of the fifth digit proximal, middle, and distal phalanges. Electronically Signed   By: Iven Finn M.D.   On: 08/25/2020 22:30   DG Chest Portable  1 View  Result Date: 08/25/2020 CLINICAL DATA:  Cough EXAM: PORTABLE CHEST 1 VIEW COMPARISON:  03/29/2019 FINDINGS: Cardiac shadow is at the upper limits of normal in size. Mild vascular congestion is noted without interstitial edema. No focal infiltrate or sizable effusion is seen. No bony abnormality is noted. IMPRESSION: Mild vascular congestion without edema or focal infiltrate. Electronically Signed   By: Inez Catalina M.D.   On: 08/25/2020 12:36   DG Foot Complete Right  Result Date: 08/25/2020 CLINICAL DATA:  Second toe wound with pain and history of gout, initial encounter EXAM: RIGHT FOOT COMPLETE - 3+ VIEW COMPARISON:  None. FINDINGS: Soft tissue wound is noted in the distal aspect of the second toe. Resorption of the second distal phalanx is noted consistent with osteomyelitis. Resorptive changes in the fifth digit are noted likely of a chronic nature given its appearance. Diffuse vascular calcifications are seen. Mild soft tissue swelling is noted. No fracture or dislocation is seen. IMPRESSION: Erosive changes in the second distal phalanx consistent with osteomyelitis. Electronically Signed   By: Inez Catalina M.D.   On: 08/25/2020 12:39        Scheduled Meds: . amLODipine  10 mg Oral Daily  . atorvastatin  10 mg Oral QPM  . calcium acetate  1,334 mg Oral TID WC  . Chlorhexidine Gluconate Cloth  6 each Topical Daily  . insulin aspart  0-6 Units Subcutaneous TID WC  . labetalol  200 mg Oral BID  . losartan  50 mg Oral Daily  . metroNIDAZOLE  500 mg Oral Q8H  . mupirocin ointment  1 application Nasal BID  . sertraline  100 mg Oral Daily   Continuous Infusions: . sodium chloride 10 mL/hr at 08/25/20 1829  . cefTRIAXone (ROCEPHIN)  IV 2 g (08/25/20 2224)  . vancomycin       LOS: 1 day    Time spent: 35  mins.More than 50% of that time was spent in counseling and/or coordination of care.      Shelly Coss, MD [facility] P12/21/2021, 7:48 AM

## 2020-08-26 NOTE — Op Note (Signed)
Date of operation: 08/26/2020.  Surgeon: Durward Fortes D.P.M.  Preoperative diagnosis: Osteomyelitis right second toe.  Postoperative diagnosis: Same.  Procedure: Amputation right third toe metatarsophalangeal joint.  Anesthesia: General endotracheal with local infiltration.  Hemostasis: None.  Estimated blood loss: 30 cc.  Pathology: Right second toe.  Cultures: Bone culture distal phalanx right second toe.  Complications: None apparent.  Operative indications: This is a 59 year old male with recent development of an ulceration on his right second toe.  Admitted with confirmed osteomyelitis in the second toe and decision made for definitive amputation.  Operative procedure: Patient was taken to the operating room and placed on the table in the supine position.  Following satisfactory general endotracheal anesthesia the right foot was anesthetized with 10 cc of 0.5% Marcaine plain around the second metatarsal.  The foot was then prepped and draped in usual sterile fashion.  Attention was directed to the distal aspect of the foot where an incision was made from dorsal to plantar around the medial and lateral base of the third toe.  Incision carried sharply down to the level of the bone where the toe was disarticulated at the metatarsal phalangeal joint and removed in toto.  The wound was flushed with copious amounts of sterile saline.  Good healthy bleeding was noted throughout the procedure.  The wound was then closed using 3-0 nylon vertical mattress and simple interrupted sutures.  Betadine gauze followed by a bulky gauze bandage ABD and Kerlix was applied to the right lower extremity followed by an Ace wrap.  Patient was awakened and transported to the PACU with vital signs stable and in good condition.

## 2020-08-26 NOTE — Anesthesia Procedure Notes (Signed)
Procedure Name: Intubation Date/Time: 08/26/2020 4:57 PM Performed by: Tollie Eth, CRNA Pre-anesthesia Checklist: Patient identified, Patient being monitored, Timeout performed, Emergency Drugs available and Suction available Patient Re-evaluated:Patient Re-evaluated prior to induction Oxygen Delivery Method: Circle system utilized Preoxygenation: Pre-oxygenation with 100% oxygen Induction Type: IV induction Ventilation: Mask ventilation without difficulty Laryngoscope Size: McGraph and 4 Grade View: Grade I Tube type: Oral Tube size: 7.0 mm Number of attempts: 1 Airway Equipment and Method: Stylet Placement Confirmation: ETT inserted through vocal cords under direct vision,  positive ETCO2 and breath sounds checked- equal and bilateral Secured at: 21 cm Tube secured with: Tape Dental Injury: Teeth and Oropharynx as per pre-operative assessment

## 2020-08-27 ENCOUNTER — Inpatient Hospital Stay: Payer: Medicare Other

## 2020-08-27 ENCOUNTER — Encounter: Payer: Self-pay | Admitting: Podiatry

## 2020-08-27 DIAGNOSIS — A419 Sepsis, unspecified organism: Secondary | ICD-10-CM

## 2020-08-27 DIAGNOSIS — A4102 Sepsis due to Methicillin resistant Staphylococcus aureus: Secondary | ICD-10-CM

## 2020-08-27 DIAGNOSIS — M869 Osteomyelitis, unspecified: Secondary | ICD-10-CM

## 2020-08-27 DIAGNOSIS — E785 Hyperlipidemia, unspecified: Secondary | ICD-10-CM

## 2020-08-27 DIAGNOSIS — I10 Essential (primary) hypertension: Secondary | ICD-10-CM

## 2020-08-27 DIAGNOSIS — E1169 Type 2 diabetes mellitus with other specified complication: Secondary | ICD-10-CM

## 2020-08-27 LAB — CBC WITH DIFFERENTIAL/PLATELET
Abs Immature Granulocytes: 0.03 10*3/uL (ref 0.00–0.07)
Basophils Absolute: 0 10*3/uL (ref 0.0–0.1)
Basophils Relative: 0 %
Eosinophils Absolute: 0.2 10*3/uL (ref 0.0–0.5)
Eosinophils Relative: 2 %
HCT: 28.8 % — ABNORMAL LOW (ref 39.0–52.0)
Hemoglobin: 9.3 g/dL — ABNORMAL LOW (ref 13.0–17.0)
Immature Granulocytes: 0 %
Lymphocytes Relative: 12 %
Lymphs Abs: 0.9 10*3/uL (ref 0.7–4.0)
MCH: 28.1 pg (ref 26.0–34.0)
MCHC: 32.3 g/dL (ref 30.0–36.0)
MCV: 87 fL (ref 80.0–100.0)
Monocytes Absolute: 0.9 10*3/uL (ref 0.1–1.0)
Monocytes Relative: 11 %
Neutro Abs: 5.8 10*3/uL (ref 1.7–7.7)
Neutrophils Relative %: 75 %
Platelets: 233 10*3/uL (ref 150–400)
RBC: 3.31 MIL/uL — ABNORMAL LOW (ref 4.22–5.81)
RDW: 13.2 % (ref 11.5–15.5)
WBC: 7.9 10*3/uL (ref 4.0–10.5)
nRBC: 0 % (ref 0.0–0.2)

## 2020-08-27 LAB — BASIC METABOLIC PANEL
Anion gap: 17 — ABNORMAL HIGH (ref 5–15)
BUN: 51 mg/dL — ABNORMAL HIGH (ref 8–23)
CO2: 20 mmol/L — ABNORMAL LOW (ref 22–32)
Calcium: 9.1 mg/dL (ref 8.9–10.3)
Chloride: 94 mmol/L — ABNORMAL LOW (ref 98–111)
Creatinine, Ser: 11.61 mg/dL — ABNORMAL HIGH (ref 0.61–1.24)
GFR, Estimated: 5 mL/min — ABNORMAL LOW (ref >60)
Glucose, Bld: 98 mg/dL (ref 70–99)
Potassium: 4.2 mmol/L (ref 3.5–5.1)
Sodium: 131 mmol/L — ABNORMAL LOW (ref 135–145)

## 2020-08-27 LAB — GLUCOSE, CAPILLARY
Glucose-Capillary: 102 mg/dL — ABNORMAL HIGH (ref 70–99)
Glucose-Capillary: 168 mg/dL — ABNORMAL HIGH (ref 70–99)
Glucose-Capillary: 133 mg/dL — ABNORMAL HIGH (ref 70–99)

## 2020-08-27 LAB — PHOSPHORUS: Phosphorus: 6.9 mg/dL — ABNORMAL HIGH (ref 2.5–4.6)

## 2020-08-27 LAB — HEPATITIS PANEL, ACUTE
HCV Ab: REACTIVE — AB
Hep A IgM: NONREACTIVE
Hep B C IgM: NONREACTIVE
Hepatitis B Surface Ag: NONREACTIVE

## 2020-08-27 LAB — HEPATITIS B SURFACE ANTIGEN: Hepatitis B Surface Ag: NONREACTIVE

## 2020-08-27 MED ORDER — BENZONATATE 100 MG PO CAPS
200.0000 mg | ORAL_CAPSULE | Freq: Two times a day (BID) | ORAL | Status: DC | PRN
  Administered 2020-08-27: 03:00:00 200 mg via ORAL
  Filled 2020-08-27 (×2): qty 2

## 2020-08-27 NOTE — Plan of Care (Signed)

## 2020-08-27 NOTE — Progress Notes (Addendum)
Pharmacy Antibiotic Note  Steve Vance is a 59 y.o. male admitted on 08/25/2020 with suspected diabetic foot infection and possible osteomyelitis of second toe of right foot. Patient has past medication history of ESRD (HD Tu,Thurs, Sat), HTN, Uncontrolled DM, and obesity. Pharmacy has been consulted for Vancomycin dosing. Patient is no longer receiving ceftriaxone and Flagyl.  Per nephrology note from yesterday, patient will have dialysis today. Will f/u on dialysis plan for tomorrow.   Plan:  Will order Vancomycin $RemoveBeforeDE'1000mg'AVewPEplmqZzojG$  IV to be given today at the end of dialysis. Will plan to draw vanc random level on the day of the 3rd HD session. Target Vancomycin level 15-25 mcg/mL    Height: $Remove'5\' 11"'lCigTKJ$  (180.3 cm) Weight: 115.2 kg (254 lb) IBW/kg (Calculated) : 75.3  Temp (24hrs), Avg:99 F (37.2 C), Min:97.9 F (36.6 C), Max:100.3 F (37.9 C)  Recent Labs  Lab 08/25/20 1335 08/27/20 0426  WBC 8.7 7.9  CREATININE 9.01* 11.61*  LATICACIDVEN 1.1  --     Estimated Creatinine Clearance: 8.6 mL/min (A) (by C-G formula based on SCr of 11.61 mg/dL (H)).    Allergies  Allergen Reactions  . Baclofen Nausea And Vomiting  . Gabapentin Nausea And Vomiting    Antimicrobials this admission: 12/20 ceftriaxone >> 12/21 12/20 vancomycin >>  12/20 Flagyl>> 12/21  Microbiology results: 12/20 BCx: MRSA 12/21 Anaerobic culture: GPC   Thank you for allowing pharmacy to be a part of this patient's care.  Rowland Lathe, PharmD Clinical Pharmacist 08/27/2020 8:32 AM
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
## 2020-08-27 NOTE — Progress Notes (Signed)
PT PLACED ON OXYGEN AT 2LPM VIA [place] FOR POX OF 88% ON RA. PT WITH REBOUND POX TO 96% AFTER OXYGEN APPLICATION, PT COACHED ON IS USE BY A. BRUMGARD, RN AFTER APPLICATION OF OXYGEN BY Marliss Coots, RN. PT TOLERATING OXYGEN WELL.

## 2020-08-27 NOTE — Progress Notes (Signed)
   08/27/20 1655  Assess: MEWS Score  Temp 99.6 F (37.6 C)  BP (!) 175/131  Pulse Rate (!) 113  Resp 20  SpO2 97 %  O2 Device Nasal Cannula  O2 Flow Rate (L/min) 2 L/min  Assess: MEWS Score  MEWS Temp 0  MEWS Systolic 0  MEWS Pulse 2  MEWS RR 0  MEWS LOC 0  MEWS Score 2  MEWS Score Color Yellow  Assess: if the MEWS score is Yellow or Red  Were vital signs taken at a resting state? Yes  Focused Assessment No change from prior assessment  Early Detection of Sepsis Score *See Row Information* Medium  MEWS guidelines implemented *See Row Information* No, vital signs rechecked  Treat  MEWS Interventions Administered scheduled meds/treatments;Other (Comment)  Escalate  MEWS: Escalate Yellow: discuss with charge nurse/RN and consider discussing with provider and RRT  Notify: Charge Nurse/RN  Name of Charge Nurse/RN Notified Jun, RN  Date Charge Nurse/RN Notified 08/27/20  Time Charge Nurse/RN Notified 1700  Notify: Provider  Provider Name/Title Cordelia Poche  Date Provider Notified 08/27/20  Time Provider Notified 1705  Notification Type Page  Notification Reason Other (Comment) (wanted to update about VS)  Response No new orders  Date of Provider Response 08/27/20  Time of Provider Response 1705  Notify: Rapid Response  Name of Rapid Response RN Notified Kennyth Lose, RN  Date Rapid Response Notified 08/27/20  Time Rapid Response Notified 1700  Document  Patient Outcome Stabilized after interventions  Progress note created (see row info) Yes

## 2020-08-27 NOTE — Progress Notes (Signed)
Patient ID: Steve Vance, male   DOB: April 02, 1961, 59 y.o.   MRN: [id number] [facility] PROGRESS NOTE  Steve Vance [id number] DOB: 09/25/1960 DOA: 08/25/2020 PCP: Preston Fleeting, MD  HPI/Subjective: Patient feels okay.  Has some pain in his toe.  Little shortness of breath.  Otherwise feels okay.  Objective: Vitals:   08/27/20 1330 08/27/20 1345  BP: (!) 180/84 (!) 175/88  Pulse: (!) 106 (!) 106  Resp: (!) 22 (!) 22  Temp:    SpO2:      Intake/Output Summary (Last 24 hours) at 08/27/2020 1507 Last data filed at 08/26/2020 2336 Gross per 24 hour  Intake 480 ml  Output 330 ml  Net 150 ml   Filed Weights   08/25/20 1101  Weight: 115.2 kg    ROS: Review of Systems  Respiratory: Positive for cough and shortness of breath.   Cardiovascular: Negative for chest pain.  Gastrointestinal: Negative for abdominal pain, nausea and vomiting.  Musculoskeletal: Positive for joint pain.   Exam: Physical Exam HENT:     Head: Normocephalic.     Mouth/Throat:     Pharynx: No oropharyngeal exudate.  Eyes:     General: Lids are normal.     Conjunctiva/sclera: Conjunctivae normal.     Pupils: Pupils are equal, round, and reactive to light.  Cardiovascular:     Rate and Rhythm: Normal rate and regular rhythm.     Heart sounds: S1 normal and S2 normal. Murmur heard.   Systolic murmur is present with a grade of 3/6.   Pulmonary:     Breath sounds: Examination of the right-middle field reveals rhonchi. Examination of the left-middle field reveals rhonchi. Examination of the right-lower field reveals decreased breath sounds and rhonchi. Examination of the left-lower field reveals decreased breath sounds and rhonchi. Decreased breath sounds and rhonchi present. No wheezing or rales.  Abdominal:     Palpations: Abdomen is soft.     Tenderness: There is no abdominal tenderness.  Musculoskeletal:     Right ankle: No swelling.     Left ankle: No swelling.  Skin:     General: Skin is warm.     Findings: No rash.  Neurological:     Mental Status: He is alert and oriented to person, place, and time.       Data Reviewed: Basic Metabolic Panel: Recent Labs  Lab 08/25/20 1335 08/26/20 1506 08/27/20 0426  NA 132*  --  131*  K 4.2 4.4 4.2  CL 93*  --  94*  CO2 23  --  20*  GLUCOSE 100*  --  98  BUN 33*  --  51*  CREATININE 9.01*  --  11.61*  CALCIUM 9.7  --  9.1  PHOS  --   --  6.9*   Liver Function Tests: Recent Labs  Lab 08/25/20 1335  AST 15  ALT 13  ALKPHOS 51  BILITOT 0.9  PROT 9.3*  ALBUMIN 3.9   CBC: Recent Labs  Lab 08/25/20 1335 08/27/20 0426  WBC 8.7 7.9  NEUTROABS 7.0 5.8  HGB 10.8* 9.3*  HCT 33.3* 28.8*  MCV 87.2 87.0  PLT 272 233    CBG: Recent Labs  Lab 08/26/20 1139 08/26/20 1603 08/26/20 1744 08/26/20 2137 08/27/20 0739  GLUCAP 145* 116* 113* 140* 102*    Recent Results (from the past 240 hour(s))  Resp Panel by RT-PCR (Flu A&B, Covid) Nasopharyngeal Swab     Status: None   Collection Time: 08/25/20 12:29 PM  Specimen: Nasopharyngeal Swab; Nasopharyngeal(NP) swabs in vial transport medium  Result Value Ref Range Status   SARS Coronavirus 2 by RT PCR NEGATIVE NEGATIVE Final    Comment: (NOTE) SARS-CoV-2 target nucleic acids are NOT DETECTED.  The SARS-CoV-2 RNA is generally detectable in upper respiratory specimens during the acute phase of infection. The lowest concentration of SARS-CoV-2 viral copies this assay can detect is 138 copies/mL. A negative result does not preclude SARS-Cov-2 infection and should not be used as the sole basis for treatment or other patient management decisions. A negative result may occur with  improper specimen collection/handling, submission of specimen other than nasopharyngeal swab, presence of viral mutation(s) within the areas targeted by this assay, and inadequate number of viral copies(<138 copies/mL). A negative result must be combined with clinical  observations, patient history, and epidemiological information. The expected result is Negative.  Fact Sheet for Patients:  EntrepreneurPulse.com.au  Fact Sheet for Healthcare Providers:  IncredibleEmployment.be  This test is no t yet approved or cleared by the [place] FDA and  has been authorized for detection and/or diagnosis of SARS-CoV-2 by FDA under an Emergency Use Authorization (EUA). This EUA will remain  in effect (meaning this test can be used) for the duration of the COVID-19 declaration under Section 564(b)(1) of the Act, 21 U.S.C.section 360bbb-3(b)(1), unless the authorization is terminated  or revoked sooner.       Influenza A by PCR NEGATIVE NEGATIVE Final   Influenza B by PCR NEGATIVE NEGATIVE Final    Comment: (NOTE) The Xpert Xpress SARS-CoV-2/FLU/RSV plus assay is intended as an aid in the diagnosis of influenza from Nasopharyngeal swab specimens and should not be used as a sole basis for treatment. Nasal washings and aspirates are unacceptable for Xpert Xpress SARS-CoV-2/FLU/RSV testing.  Fact Sheet for Patients: EntrepreneurPulse.com.au  Fact Sheet for Healthcare Providers: IncredibleEmployment.be  This test is not yet approved or cleared by the [place] FDA and has been authorized for detection and/or diagnosis of SARS-CoV-2 by FDA under an Emergency Use Authorization (EUA). This EUA will remain in effect (meaning this test can be used) for the duration of the COVID-19 declaration under Section 564(b)(1) of the Act, 21 U.S.C. section 360bbb-3(b)(1), unless the authorization is terminated or revoked.  Performed at [facility], [address] [place], [place] [address]   Culture, blood (routine x 2)     Status: Abnormal (Preliminary result)   Collection Time: 08/25/20  1:35 PM   Specimen: BLOOD  Result Value Ref Range Status   Specimen Description   Final     BLOOD BLOOD RIGHT FOREARM Performed at [facility], [address] [place], [place] [address]    Special Requests   Final    BOTTLES DRAWN AEROBIC AND ANAEROBIC Blood Culture results may not be optimal due to an excessive volume of blood received in culture bottles Performed at [facility], [address] [place], [place] [address]    Culture  Setup Time   Final    GRAM POSITIVE COCCI IN BOTH AEROBIC AND ANAEROBIC BOTTLES Organism ID to follow CRITICAL RESULT CALLED TO, READ BACK BY AND VERIFIED WITH: Hall $RemoveBe'@0250'[id number]$  08/26/20 RH Performed at [facility], [address], [place], [place] [address]    Culture (A)  Final    STAPHYLOCOCCUS AUREUS SUSCEPTIBILITIES TO FOLLOW Performed at [facility], [place] [address] [place], [place] [address]    Report Status PENDING  Incomplete  Culture, blood (routine x 2)  Status: None (Preliminary result)   Collection Time: 08/25/20  1:35 PM   Specimen: BLOOD  Result Value Ref Range Status   Specimen Description BLOOD BLOOD RIGHT HAND  Final   Special Requests   Final    BOTTLES DRAWN AEROBIC AND ANAEROBIC Blood Culture adequate volume   Culture   Final    NO GROWTH 2 DAYS Performed at [facility], [address] [place], [place] [address]    Report Status PENDING  Incomplete  Blood Culture ID Panel (Reflexed)     Status: Abnormal   Collection Time: 08/25/20  1:35 PM  Result Value Ref Range Status   Enterococcus faecalis NOT DETECTED NOT DETECTED Final   Enterococcus Faecium NOT DETECTED NOT DETECTED Final   Listeria monocytogenes NOT DETECTED NOT DETECTED Final   Staphylococcus species DETECTED (A) NOT DETECTED Final    Comment: CRITICAL RESULT CALLED TO, READ BACK BY AND VERIFIED WITH: SCOTT HALL $RemoveBe'@0250'[id number]$  08/26/20 RH    Staphylococcus aureus (BCID) DETECTED (A) NOT DETECTED Final    Comment: Methicillin (oxacillin)-resistant Staphylococcus aureus (MRSA). MRSA is predictably  resistant to beta-lactam antibiotics (except ceftaroline). Preferred therapy is vancomycin unless clinically contraindicated. Patient requires contact precautions if  hospitalized. CRITICAL RESULT CALLED TO, READ BACK BY AND VERIFIED WITH: SCOTT HALL $RemoveBe'@0250'[id number]$  08/26/20 RH    Staphylococcus epidermidis NOT DETECTED NOT DETECTED Final   Staphylococcus lugdunensis NOT DETECTED NOT DETECTED Final   Streptococcus species NOT DETECTED NOT DETECTED Final   Streptococcus agalactiae NOT DETECTED NOT DETECTED Final   Streptococcus pneumoniae NOT DETECTED NOT DETECTED Final   Streptococcus pyogenes NOT DETECTED NOT DETECTED Final   A.calcoaceticus-baumannii NOT DETECTED NOT DETECTED Final   Bacteroides fragilis NOT DETECTED NOT DETECTED Final   Enterobacterales NOT DETECTED NOT DETECTED Final   Enterobacter cloacae complex NOT DETECTED NOT DETECTED Final   Escherichia coli NOT DETECTED NOT DETECTED Final   Klebsiella aerogenes NOT DETECTED NOT DETECTED Final   Klebsiella oxytoca NOT DETECTED NOT DETECTED Final   Klebsiella pneumoniae NOT DETECTED NOT DETECTED Final   Proteus species NOT DETECTED NOT DETECTED Final   Salmonella species NOT DETECTED NOT DETECTED Final   Serratia marcescens NOT DETECTED NOT DETECTED Final   Haemophilus influenzae NOT DETECTED NOT DETECTED Final   Neisseria meningitidis NOT DETECTED NOT DETECTED Final   Pseudomonas aeruginosa NOT DETECTED NOT DETECTED Final   Stenotrophomonas maltophilia NOT DETECTED NOT DETECTED Final   Candida albicans NOT DETECTED NOT DETECTED Final   Candida auris NOT DETECTED NOT DETECTED Final   Candida glabrata NOT DETECTED NOT DETECTED Final   Candida krusei NOT DETECTED NOT DETECTED Final   Candida parapsilosis NOT DETECTED NOT DETECTED Final   Candida tropicalis NOT DETECTED NOT DETECTED Final   Cryptococcus neoformans/gattii NOT DETECTED NOT DETECTED Final   Meth resistant mecA/C and MREJ DETECTED (A) NOT DETECTED Final    Comment: CRITICAL  RESULT CALLED TO, READ BACK BY AND VERIFIED WITH: SCOTT HALL $RemoveBe'@0250'[id number]$  08/26/20 RH Performed at [facility], [address], [place], [place] [address]   Surgical PCR screen     Status: Abnormal   Collection Time: 08/26/20  5:27 AM   Specimen: Nasal Mucosa; Nasal Swab  Result Value Ref Range Status   MRSA, PCR POSITIVE (A) NEGATIVE Final    Comment: RESULT CALLED TO, READ BACK BY AND VERIFIED WITH: CANDACE SUMMERS AT 2536 08/26/20 SDR    Staphylococcus aureus POSITIVE (A) NEGATIVE Final    Comment: (NOTE) The Xpert SA Assay (FDA approved for  NASAL specimens in patients 9 years of age and older), is one component of a comprehensive surveillance program. It is not intended to diagnose infection nor to guide or monitor treatment. Performed at [facility], [address] [place], [place] [address]   Anaerobic culture     Status: None (Preliminary result)   Collection Time: 08/26/20  5:27 PM   Specimen: PATH Bone biopsy; Tissue  Result Value Ref Range Status   Specimen Description   Final    BONE R DISTAL PHALANX BONE CULTURE Performed at [facility], [address], [place], [place] [address]    Special Requests   Final    NONE Performed at [facility], [address] [place], [place] [address]    Gram Stain   Final    MODERATE WBC PRESENT, PREDOMINANTLY PMN ABUNDANT GRAM POSITIVE COCCI Performed at [facility], [address] [place], [place] [address]    Culture PENDING  Incomplete   Report Status PENDING  Incomplete     Studies: MRI Right foot without contrast  Result Date: 08/25/2020 CLINICAL DATA:  Xray Erosive changes in the second distal phalanx consistent with osteomyelitis EXAM: MRI OF THE RIGHT FOREFOOT WITHOUT CONTRAST TECHNIQUE: Multiplanar, multisequence MR imaging of the right was performed. No intravenous contrast was administered. COMPARISON:  X-ray right foot 08/25/2020. FINDINGS: Bones/Joint/Cartilage  Destruction of the majority of the second digit distal phalanx with associated hypointensity of the base of the second digit distal phalanx, as well as of the second digit middle phalanx, and likely the head of the second digit proximal phalanx on on T1 sequence with associated hyperintensity on STIR imaging. Destruction of the majority of the fifth digit middle and distal phalanges with associated hypointensity of the head/neck region of the proximal phalanx and of the remaining middle and distal phalanges on T1 sequence. These bones are not well visualized on the T2 and STIR sequences. Degenerative changes of the first metatarsal interphalangeal joint. Ligaments Unremarkable. Muscles and Tendons No associated intramuscular abscess formation. Soft tissues Overlying soft tissue defect of the distal second digit. Dermal thickening of the second digit. Associated subcutaneus tissue the dorsal foot. IMPRESSION: 1. Osteomyelitis of the second digit proximal, middle, and likely distal phalanges. 2. Possible osteomyelitis of the fifth digit proximal, middle, and distal phalanges. Electronically Signed   By: Tish Frederickson M.D.   On: 08/25/2020 22:30    Scheduled Meds: . amLODipine  10 mg Oral Daily  . atorvastatin  10 mg Oral QPM  . calcium acetate  1,334 mg Oral TID WC  . Chlorhexidine Gluconate Cloth  6 each Topical Daily  . epoetin (EPOGEN/PROCRIT) injection  4,000 Units Intravenous Q T,Th,Sa-HD  . insulin aspart  0-6 Units Subcutaneous TID WC  . lisinopril  20 mg Oral Daily  . metoprolol tartrate  50 mg Oral BID  . mupirocin ointment  1 application Nasal BID  . sertraline  100 mg Oral Daily  . vancomycin variable dose per unstable renal function (pharmacist dosing)   Does not apply See admin instructions   Continuous Infusions: . sodium chloride 10 mL/hr at 08/26/20 1633  . vancomycin      Assessment/Plan:  1. MRSA sepsis and bacteremia, present on admission.  Patient had a fever 103, tachypnea  and tachycardia.  Osteomyelitis of the right second toe.  Vancomycin IV.  Consulted cardiology for TEE. 2. Osteomyelitis of the right second toe.  Status post amputation by Dr. Alberteen Spindle on August 26, 2020.  Vascular surgery ordered an ABI. 3. End-stage renal disease on dialysis Monday Wednesday 11-14-22. Essential hypertension on Norvasc 5. Type 2 diabetes mellitus with hyperlipidemia unspecified on atorvastatin.  Hemoglobin A1c actually good at 5.8 6. Depression on Zoloft 7. Obesity with a BMI of 35.4     Code Status:     Code Status Orders  (From admission, onward)         Start     Ordered   08/26/20 1836  Full code  Continuous        08/26/20 1835        Code Status History    Date Active Date Inactive Code Status Order ID Comments User Context   04/17/2019 2232 04/19/2019 1903 Full Code [id number]  Vaughan Basta, MD Inpatient   03/29/2019 0446 04/06/2019 1638 Full Code [id number]  Harrie Foreman, MD ED   12/30/2016 1846 12/31/2016 1952 Full Code [id number]  Henreitta Leber, MD Inpatient   Advance Care Planning Activity     Family Communication: Unable to reach any family members with the phone numbers in the chart. Disposition Plan: Status is: Inpatient  Dispo: The patient is from: Home              Anticipated d/c is to: Home              Anticipated d/c date is: Likely will need 3 days or so here in the hospital              Patient currently on IV vancomycin for MRSA sepsis.  Also had toe amputation for osteomyelitis.  Consultants: -Nephrology, infectious disease, vascular surgery, podiatry.  Procedures:  Right second toe amputation  Antibiotics:  Vancomycin  Time spent: 28 minutes  [place]

## 2020-08-27 NOTE — Progress Notes (Signed)
   08/27/20 1600  Clinical Encounter Type  Visited With Patient and family together;Health care provider  Visit Type Initial  Referral From Nurse  Consult/Referral To Chaplain  Chaplain responded to a RR pg, but when she arrived at the room she was told everything was ok. Pt has a fever and they are giving him something for his blood pressure.

## 2020-08-27 NOTE — Progress Notes (Signed)
1 Day Post-Op   Subjective/Chief Complaint: Patient seen.  States that the foot is actually feeling much better.   Objective: Vital signs in last 24 hours: Temp:  [97.9 F (36.6 C)-100.3 F (37.9 C)] 99.6 F (37.6 C) (12/22 1655) Pulse Rate:  [73-113] 113 (12/22 1655) Resp:  [14-27] 20 (12/22 1655) BP: (127-185)/(50-149) 175/131 (12/22 1655) SpO2:  [88 %-100 %] 97 % (12/22 1655) Last BM Date: 08/25/20  Intake/Output from previous day: 12/21 0701 - 12/22 0700 In: 480 [P.O.:30; I.V.:350; IV Piggyback:100] Out: 330 [Urine:300; Blood:30] Intake/Output this shift: Total I/O In: -  Out: 500 [Other:500]  The bandages dry and intact on the right foot.  Upon removal minimal bleeding.  The incision is well coapted with viable skin edges.  No sign of drainage or infection.  Lab Results:  Recent Labs    08/25/20 1335 08/27/20 0426  WBC 8.7 7.9  HGB 10.8* 9.3*  HCT 33.3* 28.8*  PLT 272 233   BMET Recent Labs    08/25/20 1335 08/26/20 1506 08/27/20 0426  NA 132*  --  131*  K 4.2 4.4 4.2  CL 93*  --  94*  CO2 23  --  20*  GLUCOSE 100*  --  98  BUN 33*  --  51*  CREATININE 9.01*  --  11.61*  CALCIUM 9.7  --  9.1   PT/INR No results for input(s): LABPROT, INR in the last 72 hours. ABG No results for input(s): PHART, HCO3 in the last 72 hours.  Invalid input(s): PCO2, PO2  Studies/Results: MRI Right foot without contrast  Result Date: 08/25/2020 CLINICAL DATA:  Xray Erosive changes in the second distal phalanx consistent with osteomyelitis EXAM: MRI OF THE RIGHT FOREFOOT WITHOUT CONTRAST TECHNIQUE: Multiplanar, multisequence MR imaging of the right was performed. No intravenous contrast was administered. COMPARISON:  X-ray right foot 08/25/2020. FINDINGS: Bones/Joint/Cartilage Destruction of the majority of the second digit distal phalanx with associated hypointensity of the base of the second digit distal phalanx, as well as of the second digit middle phalanx, and  likely the head of the second digit proximal phalanx on on T1 sequence with associated hyperintensity on STIR imaging. Destruction of the majority of the fifth digit middle and distal phalanges with associated hypointensity of the head/neck region of the proximal phalanx and of the remaining middle and distal phalanges on T1 sequence. These bones are not well visualized on the T2 and STIR sequences. Degenerative changes of the first metatarsal interphalangeal joint. Ligaments Unremarkable. Muscles and Tendons No associated intramuscular abscess formation. Soft tissues Overlying soft tissue defect of the distal second digit. Dermal thickening of the second digit. Associated subcutaneus tissue the dorsal foot. IMPRESSION: 1. Osteomyelitis of the second digit proximal, middle, and likely distal phalanges. 2. Possible osteomyelitis of the fifth digit proximal, middle, and distal phalanges. Electronically Signed   By: Iven Finn M.D.   On: 08/25/2020 22:30    Anti-infectives: Anti-infectives (From admission, onward)   Start     Dose/Rate Route Frequency Ordered Stop   08/27/20 1200  vancomycin (VANCOCIN) IVPB 1000 mg/200 mL premix        1,000 mg 200 mL/hr over 60 Minutes Intravenous  Once 08/26/20 1053     08/26/20 1200  vancomycin (VANCOCIN) IVPB 1000 mg/200 mL premix  Status:  Discontinued        1,000 mg 200 mL/hr over 60 Minutes Intravenous Every T-Th-Sa (Hemodialysis) 08/25/20 1541 08/26/20 0932   08/26/20 0932  vancomycin variable dose per unstable  renal function (pharmacist dosing)         Does not apply See admin instructions 08/26/20 0932     08/25/20 1800  cefTRIAXone (ROCEPHIN) 2 g in sodium chloride 0.9 % 100 mL IVPB  Status:  Discontinued        2 g 200 mL/hr over 30 Minutes Intravenous Every 24 hours 08/25/20 1517 08/26/20 1630   08/25/20 1545  vancomycin (VANCOREADY) IVPB 500 mg/100 mL        500 mg 100 mL/hr over 60 Minutes Intravenous  Once 08/25/20 1540 08/25/20 1936   08/25/20  1530  metroNIDAZOLE (FLAGYL) tablet 500 mg  Status:  Discontinued        500 mg Oral Every 8 hours 08/25/20 1517 08/26/20 1630   08/25/20 1330  vancomycin (VANCOREADY) IVPB 2000 mg/400 mL        2,000 mg 200 mL/hr over 120 Minutes Intravenous  Once 08/25/20 1328 08/25/20 1720   08/25/20 1330  ceFAZolin (ANCEF) IVPB 2g/100 mL premix        2 g 200 mL/hr over 30 Minutes Intravenous  Once 08/25/20 1328 08/25/20 1453      Assessment/Plan: s/p Procedure(s): AMPUTATION TOE - 2rd toe (Right) Assessment: Stable status post amputation right second toe.   Plan: Betadine applied to the incision area followed by a sterile bandage.  Patient was instructed to keep this clean and dry and do not remove.  May be weightbearing on the right heel in the surgical shoe.  Limited activities for now.  Patient is stable for discharge from a podiatry standpoint.  Would recommend antibiotic coverage for a week outpatient.  Instructed the patient to follow-up next Monday Tuesday or Wednesday outpatient.  Follow-up with the patient next week  LOS: 2 days    Durward Fortes 08/27/2020

## 2020-08-27 NOTE — Progress Notes (Signed)
[facility], [place] 08/27/20  Subjective:   LOS: 2  Patient known to our practice from outpatient dialysis Last HD was on December 18.  Target weight was achieved Patient presents for right-sided ankle and knee pain.  Noted to have discoloration and sore on his right second toe.  X-ray suggests osteomyelitis.   Underwent surgery on December 21 No nausea or vomiting today.  States he was able to eat a little bit Patient seen during dialysis Tolerating well    HEMODIALYSIS FLOWSHEET:  Blood Flow Rate (mL/min): 350 mL/min Arterial Pressure (mmHg): -180 mmHg Venous Pressure (mmHg): 240 mmHg Transmembrane Pressure (mmHg): 50 mmHg Ultrafiltration Rate (mL/min): 330 mL/min Dialysate Flow Rate (mL/min): 600 ml/min Conductivity: Machine : 14 Conductivity: Machine : 14 Dialysis Fluid Bolus: Normal Saline Bolus Amount (mL): 250 mL     Objective:  Vital signs in last 24 hours:  Temp:  [97.9 F (36.6 C)-100.3 F (37.9 C)] 98.3 F (36.8 C) (12/22 1049) Pulse Rate:  [73-110] 106 (12/22 1345) Resp:  [14-27] 22 (12/22 1345) BP: (127-185)/(50-149) 175/88 (12/22 1345) SpO2:  [88 %-100 %] 88 % (12/22 1130)  Weight change:  Filed Weights   08/25/20 1101  Weight: 115.2 kg    Intake/Output:    Intake/Output Summary (Last 24 hours) at 08/27/2020 1531 Last data filed at 08/26/2020 2336 Gross per 24 hour  Intake 480 ml  Output 330 ml  Net 150 ml     Physical Exam: General:  No acute distress,    HEENT  anicteric, moist oral mucous membranes  Pulm/lungs  normal breathing effort, room air  CVS/Heart  regular, no rub  Abdomen:   Soft, nontender  Extremities:  Trace to 1+ edema, right foot bandaged  Neurologic:  Alert, able to answer questions appropriate  Skin:  no acute rashes  Access:  Left arm AV fistula       Basic Metabolic Panel:  Recent Labs  Lab 08/25/20 1335 08/26/20 1506 08/27/20 0426  NA 132*  --  131*  K 4.2 4.4 4.2  CL 93*   --  94*  CO2 23  --  20*  GLUCOSE 100*  --  98  BUN 33*  --  51*  CREATININE 9.01*  --  11.61*  CALCIUM 9.7  --  9.1  PHOS  --   --  6.9*     CBC: Recent Labs  Lab 08/25/20 1335 08/27/20 0426  WBC 8.7 7.9  NEUTROABS 7.0 5.8  HGB 10.8* 9.3*  HCT 33.3* 28.8*  MCV 87.2 87.0  PLT 272 233      Lab Results  Component Value Date   HEPBSAG  04/02/2019    THIS TEST WAS ORDERED IN ERROR AND HAS BEEN CREDITED.   HEPBSAB Non Reactive 04/02/2019   HEPBIGM Negative 04/02/2019      Microbiology:  Recent Results (from the past 240 hour(s))  Resp Panel by RT-PCR (Flu A&B, Covid) Nasopharyngeal Swab     Status: None   Collection Time: 08/25/20 12:29 PM   Specimen: Nasopharyngeal Swab; Nasopharyngeal(NP) swabs in vial transport medium  Result Value Ref Range Status   SARS Coronavirus 2 by RT PCR NEGATIVE NEGATIVE Final    Comment: (NOTE) SARS-CoV-2 target nucleic acids are NOT DETECTED.  The SARS-CoV-2 RNA is generally detectable in upper respiratory specimens during the acute phase of infection. The lowest concentration of SARS-CoV-2 viral copies this assay can detect is 138 copies/mL. A negative result does not preclude SARS-Cov-2 infection and should not  be used as the sole basis for treatment or other patient management decisions. A negative result may occur with  improper specimen collection/handling, submission of specimen other than nasopharyngeal swab, presence of viral mutation(s) within the areas targeted by this assay, and inadequate number of viral copies(<138 copies/mL). A negative result must be combined with clinical observations, patient history, and epidemiological information. The expected result is Negative.  Fact Sheet for Patients:  EntrepreneurPulse.com.au  Fact Sheet for Healthcare Providers:  IncredibleEmployment.be  This test is no t yet approved or cleared by the [place] FDA and  has been authorized for  detection and/or diagnosis of SARS-CoV-2 by FDA under an Emergency Use Authorization (EUA). This EUA will remain  in effect (meaning this test can be used) for the duration of the COVID-19 declaration under Section 564(b)(1) of the Act, 21 U.S.C.section 360bbb-3(b)(1), unless the authorization is terminated  or revoked sooner.       Influenza A by PCR NEGATIVE NEGATIVE Final   Influenza B by PCR NEGATIVE NEGATIVE Final    Comment: (NOTE) The Xpert Xpress SARS-CoV-2/FLU/RSV plus assay is intended as an aid in the diagnosis of influenza from Nasopharyngeal swab specimens and should not be used as a sole basis for treatment. Nasal washings and aspirates are unacceptable for Xpert Xpress SARS-CoV-2/FLU/RSV testing.  Fact Sheet for Patients: EntrepreneurPulse.com.au  Fact Sheet for Healthcare Providers: IncredibleEmployment.be  This test is not yet approved or cleared by the [place] FDA and has been authorized for detection and/or diagnosis of SARS-CoV-2 by FDA under an Emergency Use Authorization (EUA). This EUA will remain in effect (meaning this test can be used) for the duration of the COVID-19 declaration under Section 564(b)(1) of the Act, 21 U.S.C. section 360bbb-3(b)(1), unless the authorization is terminated or revoked.  Performed at [facility], [address] [place], [place] [address]   Culture, blood (routine x 2)     Status: Abnormal (Preliminary result)   Collection Time: 08/25/20  1:35 PM   Specimen: BLOOD  Result Value Ref Range Status   Specimen Description   Final    BLOOD BLOOD RIGHT FOREARM Performed at [facility], [address], [place], [place] [address]    Special Requests   Final    BOTTLES DRAWN AEROBIC AND ANAEROBIC Blood Culture results may not be optimal due to an excessive volume of blood received in culture bottles Performed at [facility], [address],  [place], [place] [address]    Culture  Setup Time   Final    GRAM POSITIVE COCCI IN BOTH AEROBIC AND ANAEROBIC BOTTLES Organism ID to follow CRITICAL RESULT CALLED TO, READ BACK BY AND VERIFIED WITH: SCOTT HALL $RemoveBe'@0250'[id number]$  08/26/20 RH Performed at [facility], [address] [place], [place] [address]    Culture (A)  Final    STAPHYLOCOCCUS AUREUS SUSCEPTIBILITIES TO FOLLOW Performed at [facility], [address] [place], [place] [address]    Report Status PENDING  Incomplete  Culture, blood (routine x 2)     Status: None (Preliminary result)   Collection Time: 08/25/20  1:35 PM   Specimen: BLOOD  Result Value Ref Range Status   Specimen Description BLOOD BLOOD RIGHT HAND  Final   Special Requests   Final    BOTTLES DRAWN AEROBIC AND ANAEROBIC Blood Culture adequate volume   Culture   Final    NO GROWTH 2 DAYS Performed at [facility], [address] [place], [place] [address]  Report Status PENDING  Incomplete  Blood Culture ID Panel (Reflexed)     Status: Abnormal   Collection Time: 08/25/20  1:35 PM  Result Value Ref Range Status   Enterococcus faecalis NOT DETECTED NOT DETECTED Final   Enterococcus Faecium NOT DETECTED NOT DETECTED Final   Listeria monocytogenes NOT DETECTED NOT DETECTED Final   Staphylococcus species DETECTED (A) NOT DETECTED Final    Comment: CRITICAL RESULT CALLED TO, READ BACK BY AND VERIFIED WITH: SCOTT HALL $RemoveBe'@0250'[id number]$  08/26/20 RH    Staphylococcus aureus (BCID) DETECTED (A) NOT DETECTED Final    Comment: Methicillin (oxacillin)-resistant Staphylococcus aureus (MRSA). MRSA is predictably resistant to beta-lactam antibiotics (except ceftaroline). Preferred therapy is vancomycin unless clinically contraindicated. Patient requires contact precautions if  hospitalized. CRITICAL RESULT CALLED TO, READ BACK BY AND VERIFIED WITH: SCOTT HALL $RemoveBe'@0250'[id number]$  08/26/20 RH    Staphylococcus epidermidis NOT DETECTED NOT DETECTED Final   Staphylococcus  lugdunensis NOT DETECTED NOT DETECTED Final   Streptococcus species NOT DETECTED NOT DETECTED Final   Streptococcus agalactiae NOT DETECTED NOT DETECTED Final   Streptococcus pneumoniae NOT DETECTED NOT DETECTED Final   Streptococcus pyogenes NOT DETECTED NOT DETECTED Final   A.calcoaceticus-baumannii NOT DETECTED NOT DETECTED Final   Bacteroides fragilis NOT DETECTED NOT DETECTED Final   Enterobacterales NOT DETECTED NOT DETECTED Final   Enterobacter cloacae complex NOT DETECTED NOT DETECTED Final   Escherichia coli NOT DETECTED NOT DETECTED Final   Klebsiella aerogenes NOT DETECTED NOT DETECTED Final   Klebsiella oxytoca NOT DETECTED NOT DETECTED Final   Klebsiella pneumoniae NOT DETECTED NOT DETECTED Final   Proteus species NOT DETECTED NOT DETECTED Final   Salmonella species NOT DETECTED NOT DETECTED Final   Serratia marcescens NOT DETECTED NOT DETECTED Final   Haemophilus influenzae NOT DETECTED NOT DETECTED Final   Neisseria meningitidis NOT DETECTED NOT DETECTED Final   Pseudomonas aeruginosa NOT DETECTED NOT DETECTED Final   Stenotrophomonas maltophilia NOT DETECTED NOT DETECTED Final   Candida albicans NOT DETECTED NOT DETECTED Final   Candida auris NOT DETECTED NOT DETECTED Final   Candida glabrata NOT DETECTED NOT DETECTED Final   Candida krusei NOT DETECTED NOT DETECTED Final   Candida parapsilosis NOT DETECTED NOT DETECTED Final   Candida tropicalis NOT DETECTED NOT DETECTED Final   Cryptococcus neoformans/gattii NOT DETECTED NOT DETECTED Final   Meth resistant mecA/C and MREJ DETECTED (A) NOT DETECTED Final    Comment: CRITICAL RESULT CALLED TO, READ BACK BY AND VERIFIED WITH: SCOTT HALL $RemoveBe'@0250'[id number]$  08/26/20 RH Performed at [facility], [address] [place], [place] [address]   Surgical PCR screen     Status: Abnormal   Collection Time: 08/26/20  5:27 AM   Specimen: Nasal Mucosa; Nasal Swab  Result Value Ref Range Status   MRSA, PCR POSITIVE (A) NEGATIVE  Final    Comment: RESULT CALLED TO, READ BACK BY AND VERIFIED WITH: CANDACE SUMMERS AT 5701 08/26/20 SDR    Staphylococcus aureus POSITIVE (A) NEGATIVE Final    Comment: (NOTE) The Xpert SA Assay (FDA approved for NASAL specimens in patients 11 years of age and older), is one component of a comprehensive surveillance program. It is not intended to diagnose infection nor to guide or monitor treatment. Performed at [facility], [address], [place], [place] [address]   Anaerobic culture     Status: None (Preliminary result)   Collection Time: 08/26/20  5:27 PM   Specimen: PATH Bone biopsy; Tissue  Result Value Ref Range Status   Specimen  Description   Final    BONE R DISTAL PHALANX BONE CULTURE Performed at [facility], [address], [place], [place] [address]    Special Requests   Final    NONE Performed at [facility], [place], [place] [address]    Gram Stain   Final    MODERATE WBC PRESENT, PREDOMINANTLY PMN ABUNDANT GRAM POSITIVE COCCI Performed at [facility], [place] [address], [place], [place] [address]    Culture PENDING  Incomplete   Report Status PENDING  Incomplete    Coagulation Studies: No results for input(s): LABPROT, INR in the last 72 hours.  Urinalysis: Recent Labs    08/25/20 1335  COLORURINE AMBER*  LABSPEC 1.009  PHURINE 8.0  GLUCOSEU 50*  HGBUR LARGE*  BILIRUBINUR NEGATIVE  KETONESUR NEGATIVE  PROTEINUR >=300*  NITRITE NEGATIVE  LEUKOCYTESUR TRACE*      Imaging: MRI Right foot without contrast  Result Date: 08/25/2020 CLINICAL DATA:  Xray Erosive changes in the second distal phalanx consistent with osteomyelitis EXAM: MRI OF THE RIGHT FOREFOOT WITHOUT CONTRAST TECHNIQUE: Multiplanar, multisequence MR imaging of the right was performed. No intravenous contrast was administered. COMPARISON:  X-ray right foot 08/25/2020. FINDINGS: Bones/Joint/Cartilage Destruction of the majority of  the second digit distal phalanx with associated hypointensity of the base of the second digit distal phalanx, as well as of the second digit middle phalanx, and likely the head of the second digit proximal phalanx on on T1 sequence with associated hyperintensity on STIR imaging. Destruction of the majority of the fifth digit middle and distal phalanges with associated hypointensity of the head/neck region of the proximal phalanx and of the remaining middle and distal phalanges on T1 sequence. These bones are not well visualized on the T2 and STIR sequences. Degenerative changes of the first metatarsal interphalangeal joint. Ligaments Unremarkable. Muscles and Tendons No associated intramuscular abscess formation. Soft tissues Overlying soft tissue defect of the distal second digit. Dermal thickening of the second digit. Associated subcutaneus tissue the dorsal foot. IMPRESSION: 1. Osteomyelitis of the second digit proximal, middle, and likely distal phalanges. 2. Possible osteomyelitis of the fifth digit proximal, middle, and distal phalanges. Electronically Signed   By: Iven Finn M.D.   On: 08/25/2020 22:30     Medications:   . sodium chloride 10 mL/hr at 08/26/20 1633  . vancomycin     . amLODipine  10 mg Oral Daily  . atorvastatin  10 mg Oral QPM  . calcium acetate  1,334 mg Oral TID WC  . Chlorhexidine Gluconate Cloth  6 each Topical Daily  . epoetin (EPOGEN/PROCRIT) injection  4,000 Units Intravenous Q T,Th,Sa-HD  . insulin aspart  0-6 Units Subcutaneous TID WC  . lisinopril  20 mg Oral Daily  . metoprolol tartrate  50 mg Oral BID  . mupirocin ointment  1 application Nasal BID  . sertraline  100 mg Oral Daily  . vancomycin variable dose per unstable renal function (pharmacist dosing)   Does not apply See admin instructions   benzonatate, cyclobenzaprine, HYDROcodone-acetaminophen, metoCLOPramide (REGLAN) injection, morphine injection, ondansetron (ZOFRAN) IV  Assessment/ Plan:  59  y.o. male with end-stage renal disease, diabetes, hypertension, chronic back pain was admitted on 08/25/2020 for  Principal Problem:   Osteomyelitis of second toe of right foot ([place]) Active Problems:   Type 2 diabetes mellitus with hyperlipidemia ([place])   Essential hypertension   Obesity (BMI 30.0-34.9)   ESRD on dialysis ([place])   Systemic inflammatory response syndrome (  SIRS) without organ dysfunction (HCC)   Sepsis due to methicillin resistant Staphylococcus aureus (MRSA) without acute organ dysfunction (HCC)  Osteomyelitis of second toe of right foot ([place]) [M86.9] Sepsis, due to unspecified organism, unspecified whether acute organ dysfunction present ([place]) [A41.9]  #. ESRD [facility]/TTS/left arm AV fistula/255 minutes / 116 kg First day of dialysis April 02, 2019 Make up treatment today.  Angiography planned for tomorrow Next hemodialysis anticipated on Friday   #. Anemia of CKD  Lab Results  Component Value Date   HGB 9.3 (L) 08/27/2020   Low dose EPO with HD  #. Secondary hyperparathyroidism of renal origin N 25.81      Component Value Date/Time   PTH 152 (H) 04/02/2019 2023   Lab Results  Component Value Date   PHOS 6.9 (H) 08/27/2020   Monitor calcium and phos level during this admission   #. Diabetes type 2 with CKD, peripheral neuropathy and peripheral rash Hgb A1c MFr Bld (%)  Date Value  08/25/2020 5.8 (H)   #Right second toe osteomyelitis Amputation and debridement done 08/26/2020 Broad-spectrum antibiotics -IV vancomycin   LOS: 2 Turner Kunzman 12/22/20213:31 PM  [place], [place]

## 2020-08-27 NOTE — Progress Notes (Signed)
Report to laura, rn on floor post dialysis. Pt tolerated treatment well. Pt to ultrasound from dialysis.

## 2020-08-27 NOTE — Progress Notes (Signed)
BFR down to 350 due to high VP RN aware

## 2020-08-27 NOTE — Progress Notes (Signed)
   Infectious disease has requested transesophageal echocardiogram be performed due to bacteremia with tentative date for TEE 08/28/20.      :[id number]} The risks [esophageal damage, perforation (1:10,000 risk), bleeding, pharyngeal hematoma as well as other potential complications associated with conscious sedation including aspiration, arrhythmia, respiratory failure and death], benefits (treatment guidance and diagnostic support) and alternatives of a transesophageal echocardiogram were discussed in detail with Mr. Tschantz and he is willing to proceed.   Signed, Arvil Chaco, PA-C 08/27/2020, 1:25 PM

## 2020-08-28 ENCOUNTER — Encounter
Admission: EM | Disposition: A | Discharge status: Home / Self Care | Payer: Self-pay | Source: Home / Self Care | Attending: Internal Medicine

## 2020-08-28 ENCOUNTER — Encounter: Payer: Self-pay | Admitting: Internal Medicine

## 2020-08-28 ENCOUNTER — Inpatient Hospital Stay: Payer: Medicare Other | Admitting: Certified Registered Nurse Anesthetist

## 2020-08-28 ENCOUNTER — Inpatient Hospital Stay (HOSPITAL_COMMUNITY)
Admit: 2020-08-28 | Discharge: 2020-08-28 | Disposition: A | Discharge status: Home / Self Care | Payer: Medicare Other | Attending: Physician Assistant | Admitting: Physician Assistant

## 2020-08-28 DIAGNOSIS — Z89421 Acquired absence of other right toe(s): Secondary | ICD-10-CM

## 2020-08-28 DIAGNOSIS — M86171 Other acute osteomyelitis, right ankle and foot: Secondary | ICD-10-CM

## 2020-08-28 DIAGNOSIS — F32A Depression, unspecified: Secondary | ICD-10-CM

## 2020-08-28 DIAGNOSIS — R7881 Bacteremia: Secondary | ICD-10-CM | POA: Diagnosis not present

## 2020-08-28 HISTORY — PX: TEE WITHOUT CARDIOVERSION: SHX5443

## 2020-08-28 LAB — VANCOMYCIN, RANDOM: Vancomycin Rm: 16

## 2020-08-28 LAB — SURGICAL PATHOLOGY

## 2020-08-28 LAB — GLUCOSE, CAPILLARY
Glucose-Capillary: 106 mg/dL — ABNORMAL HIGH (ref 70–99)
Glucose-Capillary: 97 mg/dL (ref 70–99)
Glucose-Capillary: 171 mg/dL — ABNORMAL HIGH (ref 70–99)

## 2020-08-28 LAB — CULTURE, BLOOD (ROUTINE X 2)

## 2020-08-28 LAB — HEPATITIS B SURFACE ANTIBODY, QUANTITATIVE: Hep B S AB Quant (Post): <3.1 m[IU]/mL — ABNORMAL LOW (ref >9.9)

## 2020-08-28 SURGERY — ECHOCARDIOGRAM, TRANSESOPHAGEAL
Anesthesia: General

## 2020-08-28 SURGERY — LOWER EXTREMITY ANGIOGRAPHY
Anesthesia: Moderate Sedation | Laterality: Right

## 2020-08-28 MED ORDER — SODIUM CHLORIDE FLUSH 0.9 % IV SOLN
INTRAVENOUS | Status: AC
  Filled 2020-08-28: qty 10

## 2020-08-28 MED ORDER — SODIUM CHLORIDE 0.9 % IV SOLN: INTRAVENOUS | Status: DC

## 2020-08-28 MED ORDER — FENTANYL CITRATE (PF) 100 MCG/2ML IJ SOLN
INTRAMUSCULAR | Status: AC
  Filled 2020-08-28: qty 2

## 2020-08-28 MED ORDER — VANCOMYCIN HCL IN DEXTROSE 1-5 GM/200ML-% IV SOLN
1000.0000 mg | Freq: Once | INTRAVENOUS | Status: DC
  Filled 2020-08-28: qty 200

## 2020-08-28 MED ORDER — VANCOMYCIN HCL IN DEXTROSE 1-5 GM/200ML-% IV SOLN
1000.0000 mg | Freq: Once | INTRAVENOUS | Status: AC
  Administered 2020-08-28: 22:00:00 1000 mg via INTRAVENOUS
  Filled 2020-08-28: qty 200

## 2020-08-28 MED ORDER — MEPERIDINE HCL 50 MG/ML IJ SOLN: 6.2500 mg | INTRAMUSCULAR | Status: DC | PRN

## 2020-08-28 MED ORDER — ONDANSETRON HCL 4 MG/2ML IJ SOLN: 4.0000 mg | Freq: Once | INTRAMUSCULAR | Status: DC | PRN

## 2020-08-28 MED ORDER — FENTANYL CITRATE (PF) 100 MCG/2ML IJ SOLN
25.0000 ug | INTRAMUSCULAR | Status: DC | PRN
Start: 2020-08-28 — End: 2020-08-28

## 2020-08-28 MED ORDER — PROPOFOL 500 MG/50ML IV EMUL
INTRAVENOUS | Status: AC
  Filled 2020-08-28: qty 50

## 2020-08-28 MED ORDER — GLYCOPYRROLATE 0.2 MG/ML IJ SOLN
INTRAMUSCULAR | Status: AC
  Filled 2020-08-28: qty 1

## 2020-08-28 MED ORDER — BUTAMBEN-TETRACAINE-BENZOCAINE 2-2-14 % EX AERO
INHALATION_SPRAY | CUTANEOUS | Status: AC
  Filled 2020-08-28: qty 5

## 2020-08-28 MED ORDER — HEPARIN SODIUM (PORCINE) 5000 UNIT/ML IJ SOLN
5000.0000 [IU] | Freq: Three times a day (TID) | INTRAMUSCULAR | Status: DC
  Administered 2020-08-28 – 2020-08-29 (×2): 5000 [IU] via SUBCUTANEOUS
  Filled 2020-08-28 (×2): qty 1

## 2020-08-28 MED ORDER — PROPOFOL 10 MG/ML IV BOLUS
INTRAVENOUS | Status: DC | PRN
  Administered 2020-08-28: 80 mg via INTRAVENOUS
  Administered 2020-08-28: 40 mg via INTRAVENOUS
  Administered 2020-08-28: 30 mg via INTRAVENOUS

## 2020-08-28 MED ORDER — MIDAZOLAM HCL 5 MG/5ML IJ SOLN
INTRAMUSCULAR | Status: AC
  Filled 2020-08-28: qty 5

## 2020-08-28 NOTE — Progress Notes (Addendum)
Pharmacy Antibiotic Note  Steve Vance is a 59 y.o. male admitted on 08/25/2020 with suspected diabetic foot infection and possible osteomyelitis of second toe of right foot. Patient has past medication history of ESRD (HD Tu,Thurs, Sat), HTN, Uncontrolled DM, and obesity. Pharmacy has been consulted for Vancomycin dosing. Patient is no longer receiving ceftriaxone and Flagyl. Repeat blood cultures remain negative for growth.   Amputation and debridement done 08/26/2020. Per ID recommendation for TEE due to MRSA bacteremia and will be scheduled for today. Angiography planned for today.   Per nephrology note from yesterday, patient will have dialysis planned for Friday.   Update @ 1421: Patient did not receive Vancomycin yesterday - per [facility] "patient not available". However, he is already in dialysis at this time. Will suspect Vanc random level will be lower than anticipated since the last dose of vancomycin was given on 12/20.   Plan:  Will order Vancomycin $RemoveBeforeDE'1000mg'[id number]$  IV to be given today at the end of dialysis on Friday. Will plan to draw vanc random level before 3rd HD session - which will be Friday morning. Target Vancomycin level 15-25 mcg/mL  Addendum @ 6122: Per nephrology note from today, patient will get dialysis today to get back on regular HD schedule. Therefore, re-ordered vanc random (stat) prior to dialysis session and have reached out to  [facility] (dialysis nurse) to get vanc random level prior to dialysis. Will follow up on vanc random level later today.     Height: $Remove'5\' 11"'HuvMhkw$  (180.3 cm) Weight: 115.2 kg (254 lb) IBW/kg (Calculated) : 75.3  Temp (24hrs), Avg:99 F (37.2 C), Min:98.3 F (36.8 C), Max:100.3 F (37.9 C)  Recent Labs  Lab 08/25/20 1335 08/27/20 0426  WBC 8.7 7.9  CREATININE 9.01* 11.61*  LATICACIDVEN 1.1  --     Estimated Creatinine Clearance: 8.6 mL/min (A) (by C-G formula based on SCr of 11.61 mg/dL (H)).    Allergies  Allergen Reactions  . Baclofen Nausea And  Vomiting  . Gabapentin Nausea And Vomiting    Antimicrobials this admission: 12/20 ceftriaxone >> 12/21 12/20 vancomycin >>  12/20 Flagyl>> 12/21  Microbiology results: 12/20 BCx: MRSA 12/21 MRSA PCR: (+) 12/21 Anaerobic culture: GPC   Thank you for allowing pharmacy to be a part of this patient's care.  Rowland Lathe, PharmD Clinical Pharmacist 08/28/2020 7:28 AM

## 2020-08-28 NOTE — Plan of Care (Signed)

## 2020-08-28 NOTE — Progress Notes (Signed)
ID Pt getting dilaysis Doing better  Wants to go home Underwent amputation of the 2nd toe on 08/26/20 Underwent TEE today  BP (!) 143/79 (BP Location: Right Arm)   Pulse 83   Temp 98.4 F (36.9 C) (Oral)   Resp 19   Ht $R'5\' 11"'EJ$  (1.803 m)   Wt 115.2 kg   SpO2 95%   BMI 35.42 kg/m    awake and alert Chest b/l air entry HSs1s2 abd soft Cns non focal Rt foot- 2nd toe amputation- foot dressing not removed Pre amputation picture      labs  CBC Latest Ref Rng & Units 08/27/2020 08/25/2020 06/27/2019  WBC 4.0 - 10.5 K/uL 7.9 8.7 -  Hemoglobin 13.0 - 17.0 g/dL 9.3(L) 10.8(L) 13.3  Hematocrit 39.0 - 52.0 % 28.8(L) 33.3(L) 39.0  Platelets 150 - 400 K/uL 233 272 -    CMP Latest Ref Rng & Units 08/27/2020 08/26/2020 08/25/2020  Glucose 70 - 99 mg/dL 98 - 100(H)  BUN 8 - 23 mg/dL 51(H) - 33(H)  Creatinine 0.61 - 1.24 mg/dL 11.61(H) - 9.01(H)  Sodium 135 - 145 mmol/L 131(L) - 132(L)  Potassium 3.5 - 5.1 mmol/L 4.2 4.4 4.2  Chloride 98 - 111 mmol/L 94(L) - 93(L)  CO2 22 - 32 mmol/L 20(L) - 23  Calcium 8.9 - 10.3 mg/dL 9.1 - 9.7  Total Protein 6.5 - 8.1 g/dL - - 9.3(H)  Total Bilirubin 0.3 - 1.2 mg/dL - - 0.9  Alkaline Phos 38 - 126 U/L - - 51  AST 15 - 41 U/L - - 15  ALT 0 - 44 U/L - - 13    Micro 12/20 BC MRSA 08/28/20 BC sent TEE no endocarditis  Impression /recommendation MRSA bacteremia Source toe infection It is not clear how long it has been present. S/p amputation - culture positive for MRSA  Pathology acute osteomyelitis Resected margin clear of osteo So will give 3-4 weeks of IV antibiotic to treat the bacteremia  OPAT orders written  End-stage  renal disease on dialysis  Discussed the management with patient- ID will follow him as OP

## 2020-08-28 NOTE — Progress Notes (Signed)
*  PRELIMINARY RESULTS* Echocardiogram Echocardiogram Transesophageal has been performed.  Sherrie Sport 08/28/2020, 9:49 AM

## 2020-08-28 NOTE — Progress Notes (Signed)
Report given to Lauren RN. Patient tolerating sandwich/soda/water without event. Very pleasant. Vitals stable. Tolerated TEE without adverse event.
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
## 2020-08-28 NOTE — Progress Notes (Signed)
Patient ID: Steve Vance, male   DOB: November 07, 1960, 59 y.o.   MRN: [id number] [facility] PROGRESS NOTE  [place] Delauder PTW:[id number] DOB: 10/24/1960 DOA: 08/25/2020 PCP: Theotis Burrow, MD  HPI/Subjective: Patient feeling okay.  Able to ambulate with his cane.  Offers no complaints.  Interested in going home for Christmas.  Objective: Vitals:   08/28/20 1015 08/28/20 1137  BP: (!) 166/79 (!) 149/83  Pulse: 96 88  Resp: 20 16  Temp:  98.4 F (36.9 C)  SpO2: 94% 93%    Filed Weights   08/25/20 1101 08/28/20 0830  Weight: 115.2 kg 115.2 kg    ROS: Review of Systems  Respiratory: Negative for shortness of breath.   Cardiovascular: Negative for chest pain.  Gastrointestinal: Negative for abdominal pain, nausea and vomiting.  Musculoskeletal: Positive for joint pain.   Exam: Physical Exam HENT:     Head: Normocephalic.     Mouth/Throat:     Pharynx: No oropharyngeal exudate.  Eyes:     General: Lids are normal.     Conjunctiva/sclera: Conjunctivae normal.     Pupils: Pupils are equal, round, and reactive to light.  Cardiovascular:     Rate and Rhythm: Normal rate and regular rhythm.     Heart sounds: Normal heart sounds, S1 normal and S2 normal.  Pulmonary:     Breath sounds: Normal breath sounds. No decreased breath sounds, wheezing, rhonchi or rales.  Abdominal:     Palpations: Abdomen is soft.     Tenderness: There is no abdominal tenderness.  Musculoskeletal:     Right lower leg: Swelling present.     Left lower leg: No swelling.  Skin:    General: Skin is warm.     Comments: Right foot bandaged.  Neurological:     Mental Status: He is alert and oriented to person, place, and time.       Data Reviewed: Basic Metabolic Panel: Recent Labs  Lab 08/25/20 1335 08/26/20 1506 08/27/20 0426  NA 132*  --  131*  K 4.2 4.4 4.2  CL 93*  --  94*  CO2 23  --  20*  GLUCOSE 100*  --  98  BUN 33*  --  51*  CREATININE 9.01*  --  11.61*  CALCIUM  9.7  --  9.1  PHOS  --   --  6.9*   Liver Function Tests: Recent Labs  Lab 08/25/20 1335  AST 15  ALT 13  ALKPHOS 51  BILITOT 0.9  PROT 9.3*  ALBUMIN 3.9    CBC: Recent Labs  Lab 08/25/20 1335 08/27/20 0426  WBC 8.7 7.9  NEUTROABS 7.0 5.8  HGB 10.8* 9.3*  HCT 33.3* 28.8*  MCV 87.2 87.0  PLT 272 233    CBG: Recent Labs  Lab 08/27/20 0739 08/27/20 1819 08/27/20 2116 08/28/20 0750 08/28/20 0824  GLUCAP 102* 168* 133* 106* 97    Recent Results (from the past 240 hour(s))  Resp Panel by RT-PCR (Flu A&B, Covid) Nasopharyngeal Swab     Status: None   Collection Time: 08/25/20 12:29 PM   Specimen: Nasopharyngeal Swab; Nasopharyngeal(NP) swabs in vial transport medium  Result Value Ref Range Status   SARS Coronavirus 2 by RT PCR NEGATIVE NEGATIVE Final    Comment: (NOTE) SARS-CoV-2 target nucleic acids are NOT DETECTED.  The SARS-CoV-2 RNA is generally detectable in upper respiratory specimens during the acute phase of infection. The lowest concentration of SARS-CoV-2 viral copies this assay can detect is 138 copies/mL. A negative  result does not preclude SARS-Cov-2 infection and should not be used as the sole basis for treatment or other patient management decisions. A negative result may occur with  improper specimen collection/handling, submission of specimen other than nasopharyngeal swab, presence of viral mutation(s) within the areas targeted by this assay, and inadequate number of viral copies(<138 copies/mL). A negative result must be combined with clinical observations, patient history, and epidemiological information. The expected result is Negative.  Fact Sheet for Patients:  EntrepreneurPulse.com.au  Fact Sheet for Healthcare Providers:  IncredibleEmployment.be  This test is no t yet approved or cleared by the [place] FDA and  has been authorized for detection and/or diagnosis of SARS-CoV-2 by FDA under  an Emergency Use Authorization (EUA). This EUA will remain  in effect (meaning this test can be used) for the duration of the COVID-19 declaration under Section 564(b)(1) of the Act, 21 U.S.C.section 360bbb-3(b)(1), unless the authorization is terminated  or revoked sooner.       Influenza A by PCR NEGATIVE NEGATIVE Final   Influenza B by PCR NEGATIVE NEGATIVE Final    Comment: (NOTE) The Xpert Xpress SARS-CoV-2/FLU/RSV plus assay is intended as an aid in the diagnosis of influenza from Nasopharyngeal swab specimens and should not be used as a sole basis for treatment. Nasal washings and aspirates are unacceptable for Xpert Xpress SARS-CoV-2/FLU/RSV testing.  Fact Sheet for Patients: EntrepreneurPulse.com.au  Fact Sheet for Healthcare Providers: IncredibleEmployment.be  This test is not yet approved or cleared by the [place] FDA and has been authorized for detection and/or diagnosis of SARS-CoV-2 by FDA under an Emergency Use Authorization (EUA). This EUA will remain in effect (meaning this test can be used) for the duration of the COVID-19 declaration under Section 564(b)(1) of the Act, 21 U.S.C. section 360bbb-3(b)(1), unless the authorization is terminated or revoked.  Performed at [facility], [address] [place], [place] [address]   Culture, blood (routine x 2)     Status: Abnormal   Collection Time: 08/25/20  1:35 PM   Specimen: BLOOD  Result Value Ref Range Status   Specimen Description   Final    BLOOD BLOOD RIGHT FOREARM Performed at [facility] [address] [place], [place] [address]    Special Requests   Final    BOTTLES DRAWN AEROBIC AND ANAEROBIC Blood Culture results may not be optimal due to an excessive volume of blood received in culture bottles Performed at [facility], [address] [place], [place] [address]    Culture  Setup Time   Final    GRAM POSITIVE COCCI IN  BOTH AEROBIC AND ANAEROBIC BOTTLES Organism ID to follow CRITICAL RESULT CALLED TO, READ BACK BY AND VERIFIED WITH: Parral $RemoveBefo'@0250'[id number]$  08/26/20 RH Performed at [facility], [address] [place], [place] [address]    Culture METHICILLIN RESISTANT STAPHYLOCOCCUS AUREUS (A)  Final   Report Status 08/28/2020 FINAL  Final   Organism ID, Bacteria METHICILLIN RESISTANT STAPHYLOCOCCUS AUREUS  Final      Susceptibility   Methicillin resistant staphylococcus aureus - MIC*    CIPROFLOXACIN <=0.5 SENSITIVE Sensitive     ERYTHROMYCIN <=0.25 SENSITIVE Sensitive     GENTAMICIN <=0.5 SENSITIVE Sensitive     OXACILLIN >=4 RESISTANT Resistant     TETRACYCLINE <=1 SENSITIVE Sensitive     VANCOMYCIN <=0.5 SENSITIVE Sensitive     TRIMETH/SULFA <=10 SENSITIVE Sensitive     CLINDAMYCIN <=0.25 SENSITIVE Sensitive     RIFAMPIN <=0.5 SENSITIVE Sensitive  Inducible Clindamycin NEGATIVE Sensitive     * METHICILLIN RESISTANT STAPHYLOCOCCUS AUREUS  Culture, blood (routine x 2)     Status: None (Preliminary result)   Collection Time: 08/25/20  1:35 PM   Specimen: BLOOD  Result Value Ref Range Status   Specimen Description BLOOD BLOOD RIGHT HAND  Final   Special Requests   Final    BOTTLES DRAWN AEROBIC AND ANAEROBIC Blood Culture adequate volume   Culture   Final    NO GROWTH 3 DAYS Performed at [facility], [address] [place], [place] [address]    Report Status PENDING  Incomplete  Blood Culture ID Panel (Reflexed)     Status: Abnormal   Collection Time: 08/25/20  1:35 PM  Result Value Ref Range Status   Enterococcus faecalis NOT DETECTED NOT DETECTED Final   Enterococcus Faecium NOT DETECTED NOT DETECTED Final   Listeria monocytogenes NOT DETECTED NOT DETECTED Final   Staphylococcus species DETECTED (A) NOT DETECTED Final    Comment: CRITICAL RESULT CALLED TO, READ BACK BY AND VERIFIED WITH: SCOTT HALL $RemoveBe'@0250'[id number]$  08/26/20 RH    Staphylococcus aureus (BCID) DETECTED (A) NOT  DETECTED Final    Comment: Methicillin (oxacillin)-resistant Staphylococcus aureus (MRSA). MRSA is predictably resistant to beta-lactam antibiotics (except ceftaroline). Preferred therapy is vancomycin unless clinically contraindicated. Patient requires contact precautions if  hospitalized. CRITICAL RESULT CALLED TO, READ BACK BY AND VERIFIED WITH: SCOTT HALL $RemoveBe'@0250'[id number]$  08/26/20 RH    Staphylococcus epidermidis NOT DETECTED NOT DETECTED Final   Staphylococcus lugdunensis NOT DETECTED NOT DETECTED Final   Streptococcus species NOT DETECTED NOT DETECTED Final   Streptococcus agalactiae NOT DETECTED NOT DETECTED Final   Streptococcus pneumoniae NOT DETECTED NOT DETECTED Final   Streptococcus pyogenes NOT DETECTED NOT DETECTED Final   A.calcoaceticus-baumannii NOT DETECTED NOT DETECTED Final   Bacteroides fragilis NOT DETECTED NOT DETECTED Final   Enterobacterales NOT DETECTED NOT DETECTED Final   Enterobacter cloacae complex NOT DETECTED NOT DETECTED Final   Escherichia coli NOT DETECTED NOT DETECTED Final   Klebsiella aerogenes NOT DETECTED NOT DETECTED Final   Klebsiella oxytoca NOT DETECTED NOT DETECTED Final   Klebsiella pneumoniae NOT DETECTED NOT DETECTED Final   Proteus species NOT DETECTED NOT DETECTED Final   Salmonella species NOT DETECTED NOT DETECTED Final   Serratia marcescens NOT DETECTED NOT DETECTED Final   Haemophilus influenzae NOT DETECTED NOT DETECTED Final   Neisseria meningitidis NOT DETECTED NOT DETECTED Final   Pseudomonas aeruginosa NOT DETECTED NOT DETECTED Final   Stenotrophomonas maltophilia NOT DETECTED NOT DETECTED Final   Candida albicans NOT DETECTED NOT DETECTED Final   Candida auris NOT DETECTED NOT DETECTED Final   Candida glabrata NOT DETECTED NOT DETECTED Final   Candida krusei NOT DETECTED NOT DETECTED Final   Candida parapsilosis NOT DETECTED NOT DETECTED Final   Candida tropicalis NOT DETECTED NOT DETECTED Final   Cryptococcus neoformans/gattii NOT  DETECTED NOT DETECTED Final   Meth resistant mecA/C and MREJ DETECTED (A) NOT DETECTED Final    Comment: CRITICAL RESULT CALLED TO, READ BACK BY AND VERIFIED WITH: SCOTT HALL $RemoveBe'@0250'[id number]$  08/26/20 RH Performed at [facility], [address] [place], [place] [address]   Surgical PCR screen     Status: Abnormal   Collection Time: 08/26/20  5:27 AM   Specimen: Nasal Mucosa; Nasal Swab  Result Value Ref Range Status   MRSA, PCR POSITIVE (A) NEGATIVE Final    Comment: RESULT CALLED TO, READ BACK BY AND VERIFIED WITH: CANDACE SUMMERS AT [phone]  08/26/20 SDR    Staphylococcus aureus POSITIVE (A) NEGATIVE Final    Comment: (NOTE) The Xpert SA Assay (FDA approved for NASAL specimens in patients 91 years of age and older), is one component of a comprehensive surveillance program. It is not intended to diagnose infection nor to guide or monitor treatment. Performed at [facility], [address] [place], [place] [address]   Anaerobic culture     Status: None (Preliminary result)   Collection Time: 08/26/20  5:27 PM   Specimen: PATH Bone biopsy; Tissue  Result Value Ref Range Status   Specimen Description   Final    BONE R DISTAL PHALANX BONE CULTURE Performed at [facility], [address], [place], [place] [address]    Special Requests   Final    NONE Performed at [facility], [address] [place], [place] [address]    Gram Stain   Final    MODERATE WBC PRESENT, PREDOMINANTLY PMN ABUNDANT GRAM POSITIVE COCCI Performed at [facility], [place] [address] [place],  [address]    Culture   Final    ABUNDANT STAPHYLOCOCCUS AUREUS SUSCEPTIBILITIES TO FOLLOW NO ANAEROBES ISOLATED; CULTURE IN PROGRESS FOR 5 DAYS    Report Status PENDING  Incomplete     Studies: US ARTERIAL ABI (SCREENING LOWER EXTREMITY)  Result Date: 08/28/2020 CLINICAL DATA:  59 year old male status post right second toe amputation. EXAM: NONINVASIVE PHYSIOLOGIC  VASCULAR STUDY OF BILATERAL LOWER EXTREMITIES TECHNIQUE: Evaluation of both lower extremities were performed at rest, including calculation of ankle-brachial indices with single level Doppler, pressure and pulse volume recording. COMPARISON:  None. FINDINGS: Right ABI:  Unable to calculate. Left ABI:  Unable to calculate. Right Lower Extremity:  Normal arterial waveforms at the ankle. Left Lower Extremity:  Normal arterial waveforms at the ankle. > 1.4 Non diagnostic secondary to incompressible vessel calcifications (medial arterial sclerosis of [place]) IMPRESSION: Unable to calculate ankle-brachial indices due to noncompressible vessels the level of the ankle. Ruthann Cancer, MD [facility] Electronically Signed   By: Ruthann Cancer MD   On: 08/28/2020 11:44   ECHO TEE  Result Date: 08/28/2020    TRANSESOPHOGEAL ECHO REPORT   Patient Name:   TERIK HAUGHEY Date of Exam: 08/28/2020 Medical Rec #:  [id number]     Height:       71.0 in Accession #:    [id number]    Weight:       254.0 lb Date of Birth:  Jul 13, 1961    BSA:          2.334 m Patient Age:    75 years      BP:           153/76 mmHg Patient Gender: M             HR:           89 bpm. Exam Location:  [facility] Procedure: 2D Echo, Color Doppler and Cardiac Doppler Indications:     Bacteremia 790.7  History:         Patient has prior history of Echocardiogram examinations, most                  recent 03/29/2019. Risk Factors:Hypertension and Diabetes.  Sonographer:     Sherrie Sport RDCS (AE) Referring Phys:  [id number] Arvil Chaco Diagnosing Phys: Kathlyn Sacramento MD PROCEDURE: The transesophogeal probe was passed without difficulty through the esophogus of the patient. Local oropharyngeal anesthetic  was provided with Benzocaine spray. Sedation performed by different physician. Image quality was good. The patient's vital signs; including heart rate, blood pressure, and oxygen saturation; remained  stable throughout the procedure. The patient developed no complications during the procedure. IMPRESSIONS  1. Left ventricular ejection fraction, by estimation, is 55 to 60%. The left ventricle has normal function. The left ventricle has no regional wall motion abnormalities. There is mild left ventricular hypertrophy. Left ventricular diastolic function could not be evaluated.  2. Right ventricular systolic function is normal. The right ventricular size is normal.  3. No left atrial/left atrial appendage thrombus was detected.  4. The mitral valve is normal in structure. Trivial mitral valve regurgitation. No evidence of mitral stenosis.  5. The aortic valve is normal in structure. Aortic valve regurgitation is not visualized. No aortic stenosis is present.  6. Agitated saline contrast bubble study was negative, with no evidence of any interatrial shunt. Conclusion(s)/Recommendation(s): No evidence of vegetation/infective endocarditis on this transesophageal echocardiogram. FINDINGS  Left Ventricle: Left ventricular ejection fraction, by estimation, is 55 to 60%. The left ventricle has normal function. The left ventricle has no regional wall motion abnormalities. The left ventricular internal cavity size was normal in size. There is  mild left ventricular hypertrophy. Left ventricular diastolic function could not be evaluated. Right Ventricle: The right ventricular size is normal. No increase in right ventricular wall thickness. Right ventricular systolic function is normal. Left Atrium: Left atrial size was normal in size. No left atrial/left atrial appendage thrombus was detected. Right Atrium: Right atrial size was normal in size. Pericardium: There is no evidence of pericardial effusion. Mitral Valve: The mitral valve is normal in structure. Trivial mitral valve regurgitation. No evidence of mitral valve stenosis. Tricuspid Valve: The tricuspid valve is normal in structure. Tricuspid valve regurgitation is  trivial. No evidence of tricuspid stenosis. Aortic Valve: The aortic valve is normal in structure. Aortic valve regurgitation is not visualized. No aortic stenosis is present. Pulmonic Valve: The pulmonic valve was normal in structure. Pulmonic valve regurgitation is not visualized. No evidence of pulmonic stenosis. Aorta: The aortic root is normal in size and structure. IAS/Shunts: No atrial level shunt detected by color flow Doppler. Agitated saline contrast bubble study was negative, with no evidence of any interatrial shunt. Kathlyn Sacramento MD Electronically signed by Kathlyn Sacramento MD Signature Date/Time: 08/28/2020/10:50:45 AM    Final     Scheduled Meds: . amLODipine  10 mg Oral Daily  . atorvastatin  10 mg Oral QPM  . butamben-tetracaine-benzocaine      . calcium acetate  1,334 mg Oral TID WC  . Chlorhexidine Gluconate Cloth  6 each Topical Daily  . epoetin (EPOGEN/PROCRIT) injection  4,000 Units Intravenous Q T,Th,Sa-HD  . fentaNYL      . heparin injection (subcutaneous)  5,000 Units Subcutaneous Q8H  . insulin aspart  0-6 Units Subcutaneous TID WC  . lisinopril  20 mg Oral Daily  . metoprolol tartrate  50 mg Oral BID  . midazolam      . mupirocin ointment  1 application Nasal BID  . sertraline  100 mg Oral Daily  . sodium chloride flush      . vancomycin variable dose per unstable renal function (pharmacist dosing)   Does not apply See admin instructions   Continuous Infusions: . sodium chloride 10 mL/hr at 08/28/20 0852  . vancomycin      Assessment/Plan:  1. MRSA sepsis and bacteremia, present on admission.  Patient initially had a fever  of 103, tachypnea and tachycardia.  Patient also has osteomyelitis of the right second toe.  Patient on vancomycin IV.  Cardiology did a TEE which was negative for endocarditis.  Infectious disease doctor to make the recommendations on length of therapy of vancomycin with dialysis. 2. Osteomyelitis of the right second toe.  Status post  amputation by Dr. Cleda Mccreedy on August 26, 2020.  Vascular surgery ordered an ABI which was unable to be calculated.  A angiogram was supposed to be done today but the patient was fed a [place] sandwich and procedure is now rescheduled for September 09, 2020. 3. End-stage renal disease on dialysis Tuesday Thursday and Saturday.  Nephrology to decide on whether they want to dialyze him today or tomorrow.  With the Christmas schedule he will likely have to dialyze on $RemoveBe'Sunday as outpatient. 4. Essential hypertension on Norvasc 5. Type 2 diabetes mellitus with hyperlipidemia unspecified on atorvastatin.  Hemoglobin A1c excellent at 5.8. 6. Depression on Zoloft 7. Obesity with a BMI of 35.42    Code Status:     Code Status Orders  (From admission, onward)         Start     Ordered   08/26/20 1836  Full code  Continuous        12'[id number]$ /21/21 1835        Code Status History    Date Active Date Inactive Code Status Order ID Comments User Context   04/17/2019 2232 04/19/2019 1903 Full Code [id number]  Vaughan Basta, MD Inpatient   03/29/2019 0446 04/06/2019 1638 Full Code [id number]  Harrie Foreman, MD ED   12/30/2016 1846 12/31/2016 1952 Full Code [id number]  Henreitta Leber, MD Inpatient   Advance Care Planning Activity     Family Communication: Tried to call the patient's mother at [phone] but mailbox was full. Disposition Plan: Status is: Inpatient  Dispo: The patient is from: Home              Anticipated d/c is to: Home              Anticipated d/c date is: Potentially 08/29/2020 versus 08/30/2020              Patient currently being treated for MRSA sepsis with IV antibiotics  Consultants:  Vascular surgery  Nephrology  Infectious disease  Podiatry  Procedures:  Right second toe amputation  Antibiotics:  IV vancomycin  Time spent: 28 minutes, case discussed with infectious disease, vascular surgery and nephrology.  [place]  [facility]  [employer]

## 2020-08-28 NOTE — Progress Notes (Signed)
Patient arrived from floor:  Room 133 Awake/alert, flat affect. Verbalizes understanding of procedure:  TEE. NPO since MD. Denies any type of pain.  HD patient: MWF.

## 2020-08-28 NOTE — Progress Notes (Signed)
[facility], [place] 08/28/20  Subjective:   LOS: 3  Patient known to our practice from outpatient dialysis Last HD was on December 18.  Target weight was achieved Patient presents for right-sided ankle and knee pain.  Noted to have discoloration and sore on his right second toe.  X-ray suggests osteomyelitis.   Underwent surgery on December 21 No nausea or vomiting today.  States he was able to eat a [place] sandwich this morning after his TEE      Objective:  Vital signs in last 24 hours:  Temp:  [97.4 F (36.3 C)-99.6 F (37.6 C)] 98.4 F (36.9 C) (12/23 1137) Pulse Rate:  [77-113] 88 (12/23 1137) Resp:  [16-27] 16 (12/23 1137) BP: (103-176)/(45-131) 149/83 (12/23 1137) SpO2:  [86 %-98 %] 93 % (12/23 1137) Weight:  [115.2 kg] 115.2 kg (12/23 0830)  Weight change:  Filed Weights   08/25/20 1101 08/28/20 0830  Weight: 115.2 kg 115.2 kg    Intake/Output:    Intake/Output Summary (Last 24 hours) at 08/28/2020 1345 Last data filed at 08/27/2020 2040 Gross per 24 hour  Intake --  Output 725 ml  Net -725 ml     Physical Exam: General:  No acute distress,    HEENT  anicteric, moist oral mucous membranes  Pulm/lungs  normal breathing effort, room air  CVS/Heart  regular, no rub  Abdomen:   Soft, nontender  Extremities:  Trace to 1+ edema, right foot bandaged  Neurologic:  Alert, able to answer questions appropriate  Skin:  no acute rashes  Access:  Left arm AV fistula       Basic Metabolic Panel:  Recent Labs  Lab 08/25/20 1335 08/26/20 1506 08/27/20 0426  NA 132*  --  131*  K 4.2 4.4 4.2  CL 93*  --  94*  CO2 23  --  20*  GLUCOSE 100*  --  98  BUN 33*  --  51*  CREATININE 9.01*  --  11.61*  CALCIUM 9.7  --  9.1  PHOS  --   --  6.9*     CBC: Recent Labs  Lab 08/25/20 1335 08/27/20 0426  WBC 8.7 7.9  NEUTROABS 7.0 5.8  HGB 10.8* 9.3*  HCT 33.3* 28.8*  MCV 87.2 87.0  PLT 272 233      Lab Results  Component  Value Date   HEPBSAG NON REACTIVE 08/27/2020   HEPBSAB Non Reactive 04/02/2019   HEPBIGM NON REACTIVE 08/27/2020      Microbiology:  Recent Results (from the past 240 hour(s))  Resp Panel by RT-PCR (Flu A&B, Covid) Nasopharyngeal Swab     Status: None   Collection Time: 08/25/20 12:29 PM   Specimen: Nasopharyngeal Swab; Nasopharyngeal(NP) swabs in vial transport medium  Result Value Ref Range Status   SARS Coronavirus 2 by RT PCR NEGATIVE NEGATIVE Final    Comment: (NOTE) SARS-CoV-2 target nucleic acids are NOT DETECTED.  The SARS-CoV-2 RNA is generally detectable in upper respiratory specimens during the acute phase of infection. The lowest concentration of SARS-CoV-2 viral copies this assay can detect is 138 copies/mL. A negative result does not preclude SARS-Cov-2 infection and should not be used as the sole basis for treatment or other patient management decisions. A negative result may occur with  improper specimen collection/handling, submission of specimen other than nasopharyngeal swab, presence of viral mutation(s) within the areas targeted by this assay, and inadequate number of viral copies(<138 copies/mL). A negative result must be combined with clinical  observations, patient history, and epidemiological information. The expected result is Negative.  Fact Sheet for Patients:  EntrepreneurPulse.com.au  Fact Sheet for Healthcare Providers:  IncredibleEmployment.be  This test is no t yet approved or cleared by the [place] FDA and  has been authorized for detection and/or diagnosis of SARS-CoV-2 by FDA under an Emergency Use Authorization (EUA). This EUA will remain  in effect (meaning this test can be used) for the duration of the COVID-19 declaration under Section 564(b)(1) of the Act, 21 U.S.C.section 360bbb-3(b)(1), unless the authorization is terminated  or revoked sooner.       Influenza A by PCR NEGATIVE NEGATIVE  Final   Influenza B by PCR NEGATIVE NEGATIVE Final    Comment: (NOTE) The Xpert Xpress SARS-CoV-2/FLU/RSV plus assay is intended as an aid in the diagnosis of influenza from Nasopharyngeal swab specimens and should not be used as a sole basis for treatment. Nasal washings and aspirates are unacceptable for Xpert Xpress SARS-CoV-2/FLU/RSV testing.  Fact Sheet for Patients: EntrepreneurPulse.com.au  Fact Sheet for Healthcare Providers: IncredibleEmployment.be  This test is not yet approved or cleared by the [place] FDA and has been authorized for detection and/or diagnosis of SARS-CoV-2 by FDA under an Emergency Use Authorization (EUA). This EUA will remain in effect (meaning this test can be used) for the duration of the COVID-19 declaration under Section 564(b)(1) of the Act, 21 U.S.C. section 360bbb-3(b)(1), unless the authorization is terminated or revoked.  Performed at [facility], [address] [place], [place] [address]   Culture, blood (routine x 2)     Status: Abnormal   Collection Time: 08/25/20  1:35 PM   Specimen: BLOOD  Result Value Ref Range Status   Specimen Description   Final    BLOOD BLOOD RIGHT FOREARM Performed at [facility], [address], [place], [place] [address]    Special Requests   Final    BOTTLES DRAWN AEROBIC AND ANAEROBIC Blood Culture results may not be optimal due to an excessive volume of blood received in culture bottles Performed at [facility], [address] [place], [place] [address]    Culture  Setup Time   Final    GRAM POSITIVE COCCI IN BOTH AEROBIC AND ANAEROBIC BOTTLES Organism ID to follow CRITICAL RESULT CALLED TO, READ BACK BY AND VERIFIED WITH: SCOTT HALL $RemoveBe'@0250'[id number]$  08/26/20 RH Performed at [facility], [address] [place],  [address]    Culture METHICILLIN RESISTANT STAPHYLOCOCCUS AUREUS (A)  Final   Report Status 08/28/2020  FINAL  Final   Organism ID, Bacteria METHICILLIN RESISTANT STAPHYLOCOCCUS AUREUS  Final      Susceptibility   Methicillin resistant staphylococcus aureus - MIC*    CIPROFLOXACIN <=0.5 SENSITIVE Sensitive     ERYTHROMYCIN <=0.25 SENSITIVE Sensitive     GENTAMICIN <=0.5 SENSITIVE Sensitive     OXACILLIN >=4 RESISTANT Resistant     TETRACYCLINE <=1 SENSITIVE Sensitive     VANCOMYCIN <=0.5 SENSITIVE Sensitive     TRIMETH/SULFA <=10 SENSITIVE Sensitive     CLINDAMYCIN <=0.25 SENSITIVE Sensitive     RIFAMPIN <=0.5 SENSITIVE Sensitive     Inducible Clindamycin NEGATIVE Sensitive     * METHICILLIN RESISTANT STAPHYLOCOCCUS AUREUS  Culture, blood (routine x 2)     Status: None (Preliminary result)   Collection Time: 08/25/20  1:35 PM   Specimen: BLOOD  Result Value Ref Range Status   Specimen Description BLOOD BLOOD RIGHT HAND  Final   Special Requests   Final  BOTTLES DRAWN AEROBIC AND ANAEROBIC Blood Culture adequate volume   Culture   Final    NO GROWTH 3 DAYS Performed at [facility], [address] [place], [place] [address]    Report Status PENDING  Incomplete  Blood Culture ID Panel (Reflexed)     Status: Abnormal   Collection Time: 08/25/20  1:35 PM  Result Value Ref Range Status   Enterococcus faecalis NOT DETECTED NOT DETECTED Final   Enterococcus Faecium NOT DETECTED NOT DETECTED Final   Listeria monocytogenes NOT DETECTED NOT DETECTED Final   Staphylococcus species DETECTED (A) NOT DETECTED Final    Comment: CRITICAL RESULT CALLED TO, READ BACK BY AND VERIFIED WITH: SCOTT HALL $RemoveBe'@0250'[id number]$  08/26/20 RH    Staphylococcus aureus (BCID) DETECTED (A) NOT DETECTED Final    Comment: Methicillin (oxacillin)-resistant Staphylococcus aureus (MRSA). MRSA is predictably resistant to beta-lactam antibiotics (except ceftaroline). Preferred therapy is vancomycin unless clinically contraindicated. Patient requires contact precautions if  hospitalized. CRITICAL RESULT CALLED TO, READ  BACK BY AND VERIFIED WITH: SCOTT HALL $RemoveBe'@0250'[id number]$  08/26/20 RH    Staphylococcus epidermidis NOT DETECTED NOT DETECTED Final   Staphylococcus lugdunensis NOT DETECTED NOT DETECTED Final   Streptococcus species NOT DETECTED NOT DETECTED Final   Streptococcus agalactiae NOT DETECTED NOT DETECTED Final   Streptococcus pneumoniae NOT DETECTED NOT DETECTED Final   Streptococcus pyogenes NOT DETECTED NOT DETECTED Final   A.calcoaceticus-baumannii NOT DETECTED NOT DETECTED Final   Bacteroides fragilis NOT DETECTED NOT DETECTED Final   Enterobacterales NOT DETECTED NOT DETECTED Final   Enterobacter cloacae complex NOT DETECTED NOT DETECTED Final   Escherichia coli NOT DETECTED NOT DETECTED Final   Klebsiella aerogenes NOT DETECTED NOT DETECTED Final   Klebsiella oxytoca NOT DETECTED NOT DETECTED Final   Klebsiella pneumoniae NOT DETECTED NOT DETECTED Final   Proteus species NOT DETECTED NOT DETECTED Final   Salmonella species NOT DETECTED NOT DETECTED Final   Serratia marcescens NOT DETECTED NOT DETECTED Final   Haemophilus influenzae NOT DETECTED NOT DETECTED Final   Neisseria meningitidis NOT DETECTED NOT DETECTED Final   Pseudomonas aeruginosa NOT DETECTED NOT DETECTED Final   Stenotrophomonas maltophilia NOT DETECTED NOT DETECTED Final   Candida albicans NOT DETECTED NOT DETECTED Final   Candida auris NOT DETECTED NOT DETECTED Final   Candida glabrata NOT DETECTED NOT DETECTED Final   Candida krusei NOT DETECTED NOT DETECTED Final   Candida parapsilosis NOT DETECTED NOT DETECTED Final   Candida tropicalis NOT DETECTED NOT DETECTED Final   Cryptococcus neoformans/gattii NOT DETECTED NOT DETECTED Final   Meth resistant mecA/C and MREJ DETECTED (A) NOT DETECTED Final    Comment: CRITICAL RESULT CALLED TO, READ BACK BY AND VERIFIED WITH: SCOTT HALL $RemoveBe'@0250'[id number]$  08/26/20 RH Performed at [facility], [address], [place], [place] [address]   Surgical PCR screen     Status: Abnormal    Collection Time: 08/26/20  5:27 AM   Specimen: Nasal Mucosa; Nasal Swab  Result Value Ref Range Status   MRSA, PCR POSITIVE (A) NEGATIVE Final    Comment: RESULT CALLED TO, READ BACK BY AND VERIFIED WITH: CANDACE SUMMERS AT 2025 08/26/20 SDR    Staphylococcus aureus POSITIVE (A) NEGATIVE Final    Comment: (NOTE) The Xpert SA Assay (FDA approved for NASAL specimens in patients 66 years of age and older), is one component of a comprehensive surveillance program. It is not intended to diagnose infection nor to guide or monitor treatment. Performed at [facility], [place], [place]  [address]   Anaerobic culture     Status: None (Preliminary result)   Collection Time: 08/26/20  5:27 PM   Specimen: PATH Bone biopsy; Tissue  Result Value Ref Range Status   Specimen Description   Final    BONE R DISTAL PHALANX BONE CULTURE Performed at [facility], [address], [place], [place] [address]    Special Requests   Final    NONE Performed at [facility], [address] [place], [place] [address]    Gram Stain   Final    MODERATE WBC PRESENT, PREDOMINANTLY PMN ABUNDANT GRAM POSITIVE COCCI Performed at [facility], [place] [address] [place],  [address]    Culture   Final    ABUNDANT STAPHYLOCOCCUS AUREUS SUSCEPTIBILITIES TO FOLLOW NO ANAEROBES ISOLATED; CULTURE IN PROGRESS FOR 5 DAYS    Report Status PENDING  Incomplete    Coagulation Studies: No results for input(s): LABPROT, INR in the last 72 hours.  Urinalysis: No results for input(s): COLORURINE, LABSPEC, PHURINE, GLUCOSEU, HGBUR, BILIRUBINUR, KETONESUR, PROTEINUR, UROBILINOGEN, NITRITE, LEUKOCYTESUR in the last 72 hours.  Invalid input(s): APPERANCEUR    Imaging: US ARTERIAL ABI (SCREENING LOWER EXTREMITY)  Result Date: 08/28/2020 CLINICAL DATA:  59 year old male status post right second toe amputation. EXAM: NONINVASIVE PHYSIOLOGIC VASCULAR STUDY OF BILATERAL  LOWER EXTREMITIES TECHNIQUE: Evaluation of both lower extremities were performed at rest, including calculation of ankle-brachial indices with single level Doppler, pressure and pulse volume recording. COMPARISON:  None. FINDINGS: Right ABI:  Unable to calculate. Left ABI:  Unable to calculate. Right Lower Extremity:  Normal arterial waveforms at the ankle. Left Lower Extremity:  Normal arterial waveforms at the ankle. > 1.4 Non diagnostic secondary to incompressible vessel calcifications (medial arterial sclerosis of [place]) IMPRESSION: Unable to calculate ankle-brachial indices due to noncompressible vessels the level of the ankle. Ruthann Cancer, MD [facility] Electronically Signed   By: Ruthann Cancer MD   On: 08/28/2020 11:44   ECHO TEE  Result Date: 08/28/2020    TRANSESOPHOGEAL ECHO REPORT   Patient Name:   MARCELLIUS MONTAGNA Date of Exam: 08/28/2020 Medical Rec #:  [id number]     Height:       71.0 in Accession #:    [id number]    Weight:       254.0 lb Date of Birth:  March 30, 1961    BSA:          2.334 m Patient Age:    59 years      BP:           153/76 mmHg Patient Gender: M             HR:           89 bpm. Exam Location:  [facility] Procedure: 2D Echo, Color Doppler and Cardiac Doppler Indications:     Bacteremia 790.7  History:         Patient has prior history of Echocardiogram examinations, most                  recent 03/29/2019. Risk Factors:Hypertension and Diabetes.  Sonographer:     Sherrie Sport RDCS (AE) Referring Phys:  [id number] Arvil Chaco Diagnosing Phys: Kathlyn Sacramento MD PROCEDURE: The transesophogeal probe was passed without difficulty through the esophogus of the patient. Local oropharyngeal anesthetic was provided with Benzocaine spray. Sedation performed by different physician. Image quality was good. The patient's vital signs; including heart rate, blood pressure, and  oxygen saturation; remained stable throughout the procedure.  The patient developed no complications during the procedure. IMPRESSIONS  1. Left ventricular ejection fraction, by estimation, is 55 to 60%. The left ventricle has normal function. The left ventricle has no regional wall motion abnormalities. There is mild left ventricular hypertrophy. Left ventricular diastolic function could not be evaluated.  2. Right ventricular systolic function is normal. The right ventricular size is normal.  3. No left atrial/left atrial appendage thrombus was detected.  4. The mitral valve is normal in structure. Trivial mitral valve regurgitation. No evidence of mitral stenosis.  5. The aortic valve is normal in structure. Aortic valve regurgitation is not visualized. No aortic stenosis is present.  6. Agitated saline contrast bubble study was negative, with no evidence of any interatrial shunt. Conclusion(s)/Recommendation(s): No evidence of vegetation/infective endocarditis on this transesophageal echocardiogram. FINDINGS  Left Ventricle: Left ventricular ejection fraction, by estimation, is 55 to 60%. The left ventricle has normal function. The left ventricle has no regional wall motion abnormalities. The left ventricular internal cavity size was normal in size. There is  mild left ventricular hypertrophy. Left ventricular diastolic function could not be evaluated. Right Ventricle: The right ventricular size is normal. No increase in right ventricular wall thickness. Right ventricular systolic function is normal. Left Atrium: Left atrial size was normal in size. No left atrial/left atrial appendage thrombus was detected. Right Atrium: Right atrial size was normal in size. Pericardium: There is no evidence of pericardial effusion. Mitral Valve: The mitral valve is normal in structure. Trivial mitral valve regurgitation. No evidence of mitral valve stenosis. Tricuspid Valve: The tricuspid valve is normal in structure. Tricuspid valve regurgitation is trivial. No evidence of tricuspid  stenosis. Aortic Valve: The aortic valve is normal in structure. Aortic valve regurgitation is not visualized. No aortic stenosis is present. Pulmonic Valve: The pulmonic valve was normal in structure. Pulmonic valve regurgitation is not visualized. No evidence of pulmonic stenosis. Aorta: The aortic root is normal in size and structure. IAS/Shunts: No atrial level shunt detected by color flow Doppler. Agitated saline contrast bubble study was negative, with no evidence of any interatrial shunt. Kathlyn Sacramento MD Electronically signed by Kathlyn Sacramento MD Signature Date/Time: 08/28/2020/10:50:45 AM    Final      Medications:   . sodium chloride 10 mL/hr at 08/28/20 8341  . vancomycin     . amLODipine  10 mg Oral Daily  . atorvastatin  10 mg Oral QPM  . butamben-tetracaine-benzocaine      . calcium acetate  1,334 mg Oral TID WC  . Chlorhexidine Gluconate Cloth  6 each Topical Daily  . epoetin (EPOGEN/PROCRIT) injection  4,000 Units Intravenous Q T,Th,Sa-HD  . fentaNYL      . heparin injection (subcutaneous)  5,000 Units Subcutaneous Q8H  . insulin aspart  0-6 Units Subcutaneous TID WC  . lisinopril  20 mg Oral Daily  . metoprolol tartrate  50 mg Oral BID  . midazolam      . mupirocin ointment  1 application Nasal BID  . sertraline  100 mg Oral Daily  . sodium chloride flush      . vancomycin variable dose per unstable renal function (pharmacist dosing)   Does not apply See admin instructions   benzonatate, cyclobenzaprine, HYDROcodone-acetaminophen, metoCLOPramide (REGLAN) injection, morphine injection, ondansetron (ZOFRAN) IV  Assessment/ Plan:  59 y.o. male with end-stage renal disease, diabetes, hypertension, chronic back pain was admitted on 08/25/2020 for  Principal Problem:   Osteomyelitis of second toe  of right foot ([place]) Active Problems:   Type 2 diabetes mellitus with hyperlipidemia ([facility])   Essential hypertension   Obesity (BMI 30.0-34.9)   ESRD on dialysis ([place])    Systemic inflammatory response syndrome (SIRS) without organ dysfunction ([place])   Sepsis due to methicillin resistant Staphylococcus aureus (MRSA) without acute organ dysfunction ([place])  Osteomyelitis of second toe of right foot ([place]) [M86.9] Sepsis, due to unspecified organism, unspecified whether acute organ dysfunction present ([place]) [A41.9]  #. ESRD [facility]/TTS/left arm AV fistula/255 minutes / 116 kg First day of dialysis April 02, 2019 Make up treatment today.  Angiography planned for January 4 Next hemodialysis today.  Outpatient hemodialysis on Sunday   #. Anemia of CKD  Lab Results  Component Value Date   HGB 9.3 (L) 08/27/2020   Low dose EPO with HD  #. Secondary hyperparathyroidism of renal origin N 25.81      Component Value Date/Time   PTH 152 (H) 04/02/2019 2023   Lab Results  Component Value Date   PHOS 6.9 (H) 08/27/2020   Monitor calcium and phos level during this admission   #. Diabetes type 2 with CKD, peripheral neuropathy and peripheral rash Hgb A1c MFr Bld (%)  Date Value  08/25/2020 5.8 (H)   #Right second toe osteomyelitis Amputation and debridement done 08/26/2020 Broad-spectrum antibiotics -IV vancomycin We will plan on continuing vancomycin as outpatient for at least 2 weeks   LOS: [place] 12/23/20211:45 PM  [place], [place]

## 2020-08-28 NOTE — Care Management Important Message (Signed)
Important Message  Patient Details  Name: Steve Vance MRN: [id number] Date of Birth: 21-Nov-1960   Medicare Important Message Given:  Yes     Shelbie Ammons, RN 08/28/2020, 3:16 PM

## 2020-08-28 NOTE — Transfer of Care (Signed)
Immediate Anesthesia Transfer of Care Note  Patient: Steve Vance  Procedure(s) Performed: TRANSESOPHAGEAL ECHOCARDIOGRAM (TEE) (N/A )  Patient Location: Short Stay  Anesthesia Type:General  Level of Consciousness: awake  Airway & Oxygen Therapy: Patient connected to nasal cannula oxygen  Post-op Assessment: Post -op Vital signs reviewed and stable  Post vital signs: stable  Last Vitals:  Vitals Value Taken Time  BP 162/68   Temp    Pulse 93   Resp    SpO2 95     Last Pain:  Vitals:   08/28/20 0830  TempSrc: Oral  PainSc: 0-No pain         Complications: No complications documented.

## 2020-08-28 NOTE — Progress Notes (Signed)
Patient refusing to dialyze for 3.5 hrs per MD orders. Dr. Candiss Norse made aware and new order received for patient to dialyze 3 hrs. Treatment started without any difficulties. RN noted oxygen saturation decreased at 88% and oxygen via [place] started at 2 LPM. Will continue to monitor.

## 2020-08-28 NOTE — Anesthesia Postprocedure Evaluation (Signed)
Anesthesia Post Note  Patient: Steve Vance  Procedure(s) Performed: TRANSESOPHAGEAL ECHOCARDIOGRAM (TEE) (N/A )  Patient location during evaluation: PACU Anesthesia Type: General Level of consciousness: awake and alert, awake and oriented Pain management: pain level controlled Vital Signs Assessment: post-procedure vital signs reviewed and stable Respiratory status: spontaneous breathing, nonlabored ventilation and respiratory function stable Cardiovascular status: blood pressure returned to baseline Postop Assessment: no apparent nausea or vomiting Anesthetic complications: no   No complications documented.   Last Vitals:  Vitals:   08/28/20 1000 08/28/20 1015  BP: (!) 155/75 (!) 166/79  Pulse: 91 96  Resp: (!) 23 20  Temp:    SpO2: 92% 94%    Last Pain:  Vitals:   08/28/20 1015  TempSrc:   PainSc: 0-No pain                 Phill Mutter

## 2020-08-28 NOTE — Progress Notes (Signed)
Pharmacy Antibiotic Note  Steve Vance is a 59 y.o. male admitted on 08/25/2020 with suspected diabetic foot infection and possible osteomyelitis of second toe of right foot. Patient has past medication history of ESRD (HD Tu,Thurs, Sat), HTN, Uncontrolled DM, and obesity. Pharmacy has been consulted for Vancomycin dosing. Patient is no longer receiving ceftriaxone and Flagyl. Repeat blood cultures remain negative for growth.   Amputation and debridement done 08/26/2020. Per ID recommendation for TEE due to MRSA bacteremia and will be scheduled for today. Angiography planned for today.   Per nephrology note from yesterday, patient will have dialysis planned for Friday.   Update @ 1421: Patient did not receive Vancomycin yesterday - per [facility] "patient not available". However, he is already in dialysis at this time. Will suspect Vanc random level will be lower than anticipated since the last dose of vancomycin was given on 12/20.   Plan:  Will order Vancomycin $RemoveBeforeDE'1000mg'pBXzxejWTHCFPuF$  IV to be given today at the end of dialysis on Friday. Will plan to draw vanc random level before 3rd HD session - which will be Friday morning. Target Vancomycin level 15-25 mcg/mL  Addendum @ 8343: Per nephrology note from today, patient will get dialysis today to get back on regular HD schedule. Therefore, re-ordered vanc random (stat) prior to dialysis session and have reached out to  [facility] (dialysis nurse) to get vanc random level prior to dialysis. Will follow up on vanc random level later today.   Update @ 1527: TEE negative. Vanc Random 16 mcg/mL (therapeutic). Last cumulative dose of Vancomycin on 12/20 was 2500 mg (loading dose). Pt missed 12/22 dose. Will order Vancomycin 1000 mg to be given at the end of dialysis. Will follow up on HD schedule for tomorrow.     Height: $Remove'5\' 11"'hRMypOt$  (180.3 cm) Weight: 115.2 kg (253 lb 15.5 oz) IBW/kg (Calculated) : 75.3  Temp (24hrs), Avg:98.8 F (37.1 C), Min:97.4 F (36.3 C), Max:99.6 F  (37.6 C)  Recent Labs  Lab 08/25/20 1335 08/27/20 0426 08/28/20 1135  WBC 8.7 7.9  --   CREATININE 9.01* 11.61*  --   LATICACIDVEN 1.1  --   --   VANCORANDOM  --   --  16    Estimated Creatinine Clearance: 8.6 mL/min (A) (by C-G formula based on SCr of 11.61 mg/dL (H)).    Allergies  Allergen Reactions  . Baclofen Nausea And Vomiting  . Gabapentin Nausea And Vomiting    Antimicrobials this admission: 12/20 ceftriaxone >> 12/21 12/20 vancomycin >>  12/20 Flagyl>> 12/21  Microbiology results: 12/20 BCx: MRSA 12/21 MRSA PCR: (+) 12/21 Anaerobic culture: GPC   Thank you for allowing pharmacy to be a part of this patient's care.  Rowland Lathe, PharmD Clinical Pharmacist 08/28/2020 3:26 PM

## 2020-08-29 LAB — GLUCOSE, CAPILLARY
Glucose-Capillary: 89 mg/dL (ref 70–99)
Glucose-Capillary: 93 mg/dL (ref 70–99)

## 2020-08-29 MED ORDER — AMLODIPINE BESYLATE 10 MG PO TABS: 10.0000 mg | ORAL_TABLET | Freq: Every day | ORAL | 0 refills | Status: DC

## 2020-08-29 MED ORDER — VANCOMYCIN IV (FOR PTA / DISCHARGE USE ONLY): 1000.0000 mg | INTRAVENOUS | 0 refills | Status: DC | PRN

## 2020-08-29 MED ORDER — HYDROCODONE-ACETAMINOPHEN 5-325 MG PO TABS
1.0000 | ORAL_TABLET | Freq: Four times a day (QID) | ORAL | 0 refills | Status: DC | PRN
Start: 2020-08-29 — End: 2021-04-29
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
## 2020-08-29 NOTE — Progress Notes (Signed)
[facility], [place] 08/29/20  Subjective:   LOS: 4  Patient known to our practice from outpatient dialysis Last HD was on December 18.  Target weight was achieved Patient presents for right-sided ankle and knee pain.  Noted to have discoloration and sore on his right second toe.  X-ray suggests osteomyelitis.   Underwent surgery on December 21 No nausea or vomiting today.      Underwent dialysis yesterday successfully   Objective:  Vital signs in last 24 hours:  Temp:  [98 F (36.7 C)-98.6 F (37 C)] 98.6 F (37 C) (12/24 1220) Pulse Rate:  [71-88] 72 (12/24 1220) Resp:  [15-26] 18 (12/24 1220) BP: (138-169)/(59-87) 163/79 (12/24 1220) SpO2:  [88 %-99 %] 99 % (12/24 1220)  Weight change:  Filed Weights   08/25/20 1101 08/28/20 0830  Weight: 115.2 kg 115.2 kg    Intake/Output:    Intake/Output Summary (Last 24 hours) at 08/29/2020 1452 Last data filed at 08/29/2020 0353 Gross per 24 hour  Intake 40 ml  Output 476 ml  Net -436 ml     Physical Exam: General:  No acute distress,    HEENT  anicteric, moist oral mucous membranes  Pulm/lungs  normal breathing effort, room air  CVS/Heart  regular, no rub  Abdomen:   Soft, nontender  Extremities:  Trace to 1+ edema, right foot bandaged  Neurologic:  Alert, able to answer questions appropriate  Skin:  no acute rashes  Access:  Left arm AV fistula       Basic Metabolic Panel:  Recent Labs  Lab 08/25/20 1335 08/26/20 1506 08/27/20 0426  NA 132*  --  131*  K 4.2 4.4 4.2  CL 93*  --  94*  CO2 23  --  20*  GLUCOSE 100*  --  98  BUN 33*  --  51*  CREATININE 9.01*  --  11.61*  CALCIUM 9.7  --  9.1  PHOS  --   --  6.9*     CBC: Recent Labs  Lab 08/25/20 1335 08/27/20 0426  WBC 8.7 7.9  NEUTROABS 7.0 5.8  HGB 10.8* 9.3*  HCT 33.3* 28.8*  MCV 87.2 87.0  PLT 272 233      Lab Results  Component Value Date   HEPBSAG NON REACTIVE 08/27/2020   HEPBSAB Non Reactive  04/02/2019   HEPBIGM NON REACTIVE 08/27/2020      Microbiology:  Recent Results (from the past 240 hour(s))  Resp Panel by RT-PCR (Flu A&B, Covid) Nasopharyngeal Swab     Status: None   Collection Time: 08/25/20 12:29 PM   Specimen: Nasopharyngeal Swab; Nasopharyngeal(NP) swabs in vial transport medium  Result Value Ref Range Status   SARS Coronavirus 2 by RT PCR NEGATIVE NEGATIVE Final    Comment: (NOTE) SARS-CoV-2 target nucleic acids are NOT DETECTED.  The SARS-CoV-2 RNA is generally detectable in upper respiratory specimens during the acute phase of infection. The lowest concentration of SARS-CoV-2 viral copies this assay can detect is 138 copies/mL. A negative result does not preclude SARS-Cov-2 infection and should not be used as the sole basis for treatment or other patient management decisions. A negative result may occur with  improper specimen collection/handling, submission of specimen other than nasopharyngeal swab, presence of viral mutation(s) within the areas targeted by this assay, and inadequate number of viral copies(<138 copies/mL). A negative result must be combined with clinical observations, patient history, and epidemiological information. The expected result is Negative.  Fact Sheet for Patients:  EntrepreneurPulse.com.au  Fact Sheet for Healthcare Providers:  IncredibleEmployment.be  This test is no t yet approved or cleared by the [place] FDA and  has been authorized for detection and/or diagnosis of SARS-CoV-2 by FDA under an Emergency Use Authorization (EUA). This EUA will remain  in effect (meaning this test can be used) for the duration of the COVID-19 declaration under Section 564(b)(1) of the Act, 21 U.S.C.section 360bbb-3(b)(1), unless the authorization is terminated  or revoked sooner.       Influenza A by PCR NEGATIVE NEGATIVE Final   Influenza B by PCR NEGATIVE NEGATIVE Final    Comment:  (NOTE) The Xpert Xpress SARS-CoV-2/FLU/RSV plus assay is intended as an aid in the diagnosis of influenza from Nasopharyngeal swab specimens and should not be used as a sole basis for treatment. Nasal washings and aspirates are unacceptable for Xpert Xpress SARS-CoV-2/FLU/RSV testing.  Fact Sheet for Patients: EntrepreneurPulse.com.au  Fact Sheet for Healthcare Providers: IncredibleEmployment.be  This test is not yet approved or cleared by the [place] FDA and has been authorized for detection and/or diagnosis of SARS-CoV-2 by FDA under an Emergency Use Authorization (EUA). This EUA will remain in effect (meaning this test can be used) for the duration of the COVID-19 declaration under Section 564(b)(1) of the Act, 21 U.S.C. section 360bbb-3(b)(1), unless the authorization is terminated or revoked.  Performed at [facility], [address] [place], [place] [address]   Culture, blood (routine x 2)     Status: Abnormal   Collection Time: 08/25/20  1:35 PM   Specimen: BLOOD  Result Value Ref Range Status   Specimen Description   Final    BLOOD BLOOD RIGHT FOREARM Performed at [facility], [address], [place], [place] [address]    Special Requests   Final    BOTTLES DRAWN AEROBIC AND ANAEROBIC Blood Culture results may not be optimal due to an excessive volume of blood received in culture bottles Performed at [facility], [address] [place], [place] [address]    Culture  Setup Time   Final    GRAM POSITIVE COCCI IN BOTH AEROBIC AND ANAEROBIC BOTTLES Organism ID to follow CRITICAL RESULT CALLED TO, READ BACK BY AND VERIFIED WITH: SCOTT HALL $RemoveBe'@0250'[id number]$  08/26/20 RH Performed at [facility], [address] [place],  [address]    Culture METHICILLIN RESISTANT STAPHYLOCOCCUS AUREUS (A)  Final   Report Status 08/28/2020 FINAL  Final   Organism ID, Bacteria METHICILLIN RESISTANT  STAPHYLOCOCCUS AUREUS  Final      Susceptibility   Methicillin resistant staphylococcus aureus - MIC*    CIPROFLOXACIN <=0.5 SENSITIVE Sensitive     ERYTHROMYCIN <=0.25 SENSITIVE Sensitive     GENTAMICIN <=0.5 SENSITIVE Sensitive     OXACILLIN >=4 RESISTANT Resistant     TETRACYCLINE <=1 SENSITIVE Sensitive     VANCOMYCIN <=0.5 SENSITIVE Sensitive     TRIMETH/SULFA <=10 SENSITIVE Sensitive     CLINDAMYCIN <=0.25 SENSITIVE Sensitive     RIFAMPIN <=0.5 SENSITIVE Sensitive     Inducible Clindamycin NEGATIVE Sensitive     * METHICILLIN RESISTANT STAPHYLOCOCCUS AUREUS  Culture, blood (routine x 2)     Status: None (Preliminary result)   Collection Time: 08/25/20  1:35 PM   Specimen: BLOOD  Result Value Ref Range Status   Specimen Description BLOOD BLOOD RIGHT HAND  Final   Special Requests   Final    BOTTLES DRAWN AEROBIC AND ANAEROBIC Blood Culture adequate volume   Culture  Final    NO GROWTH 4 DAYS Performed at [facility], [address] [place], [place] [address]    Report Status PENDING  Incomplete  Blood Culture ID Panel (Reflexed)     Status: Abnormal   Collection Time: 08/25/20  1:35 PM  Result Value Ref Range Status   Enterococcus faecalis NOT DETECTED NOT DETECTED Final   Enterococcus Faecium NOT DETECTED NOT DETECTED Final   Listeria monocytogenes NOT DETECTED NOT DETECTED Final   Staphylococcus species DETECTED (A) NOT DETECTED Final    Comment: CRITICAL RESULT CALLED TO, READ BACK BY AND VERIFIED WITH: SCOTT HALL $RemoveBe'@0250'[id number]$  08/26/20 RH    Staphylococcus aureus (BCID) DETECTED (A) NOT DETECTED Final    Comment: Methicillin (oxacillin)-resistant Staphylococcus aureus (MRSA). MRSA is predictably resistant to beta-lactam antibiotics (except ceftaroline). Preferred therapy is vancomycin unless clinically contraindicated. Patient requires contact precautions if  hospitalized. CRITICAL RESULT CALLED TO, READ BACK BY AND VERIFIED WITH: SCOTT HALL $RemoveBe'@0250'[id number]$  08/26/20 RH     Staphylococcus epidermidis NOT DETECTED NOT DETECTED Final   Staphylococcus lugdunensis NOT DETECTED NOT DETECTED Final   Streptococcus species NOT DETECTED NOT DETECTED Final   Streptococcus agalactiae NOT DETECTED NOT DETECTED Final   Streptococcus pneumoniae NOT DETECTED NOT DETECTED Final   Streptococcus pyogenes NOT DETECTED NOT DETECTED Final   A.calcoaceticus-baumannii NOT DETECTED NOT DETECTED Final   Bacteroides fragilis NOT DETECTED NOT DETECTED Final   Enterobacterales NOT DETECTED NOT DETECTED Final   Enterobacter cloacae complex NOT DETECTED NOT DETECTED Final   Escherichia coli NOT DETECTED NOT DETECTED Final   Klebsiella aerogenes NOT DETECTED NOT DETECTED Final   Klebsiella oxytoca NOT DETECTED NOT DETECTED Final   Klebsiella pneumoniae NOT DETECTED NOT DETECTED Final   Proteus species NOT DETECTED NOT DETECTED Final   Salmonella species NOT DETECTED NOT DETECTED Final   Serratia marcescens NOT DETECTED NOT DETECTED Final   Haemophilus influenzae NOT DETECTED NOT DETECTED Final   Neisseria meningitidis NOT DETECTED NOT DETECTED Final   Pseudomonas aeruginosa NOT DETECTED NOT DETECTED Final   Stenotrophomonas maltophilia NOT DETECTED NOT DETECTED Final   Candida albicans NOT DETECTED NOT DETECTED Final   Candida auris NOT DETECTED NOT DETECTED Final   Candida glabrata NOT DETECTED NOT DETECTED Final   Candida krusei NOT DETECTED NOT DETECTED Final   Candida parapsilosis NOT DETECTED NOT DETECTED Final   Candida tropicalis NOT DETECTED NOT DETECTED Final   Cryptococcus neoformans/gattii NOT DETECTED NOT DETECTED Final   Meth resistant mecA/C and MREJ DETECTED (A) NOT DETECTED Final    Comment: CRITICAL RESULT CALLED TO, READ BACK BY AND VERIFIED WITH: SCOTT HALL $RemoveBe'@0250'[id number]$  08/26/20 RH Performed at [facility], [address] [place], [place] [address]   Surgical PCR screen     Status: Abnormal   Collection Time: 08/26/20  5:27 AM   Specimen: Nasal Mucosa;  Nasal Swab  Result Value Ref Range Status   MRSA, PCR POSITIVE (A) NEGATIVE Final    Comment: RESULT CALLED TO, READ BACK BY AND VERIFIED WITH: CANDACE SUMMERS AT 3710 08/26/20 SDR    Staphylococcus aureus POSITIVE (A) NEGATIVE Final    Comment: (NOTE) The Xpert SA Assay (FDA approved for NASAL specimens in patients 46 years of age and older), is one component of a comprehensive surveillance program. It is not intended to diagnose infection nor to guide or monitor treatment. Performed at [facility], [address], [place], [place] [address]   Anaerobic culture     Status: None (Preliminary result)  Collection Time: 08/26/20  5:27 PM   Specimen: PATH Bone biopsy; Tissue  Result Value Ref Range Status   Specimen Description   Final    BONE R DISTAL PHALANX BONE CULTURE Performed at [facility], [address], [place], [place] [address]    Special Requests   Final    NONE Performed at [facility], [address] [place], [place] [address]    Gram Stain   Final    MODERATE WBC PRESENT, PREDOMINANTLY PMN ABUNDANT GRAM POSITIVE COCCI Performed at [facility], [address] [place], [place] [address]    Culture   Final    ABUNDANT STAPHYLOCOCCUS AUREUS SUSCEPTIBILITIES TO FOLLOW REPEATING NO ANAEROBES ISOLATED; CULTURE IN PROGRESS FOR 5 DAYS    Report Status PENDING  Incomplete  CULTURE, BLOOD (ROUTINE X 2) w Reflex to ID Panel     Status: None (Preliminary result)   Collection Time: 08/28/20 11:36 AM   Specimen: BLOOD  Result Value Ref Range Status   Specimen Description BLOOD RIGHT [facility]  Final   Special Requests   Final    BOTTLES DRAWN AEROBIC AND ANAEROBIC Blood Culture adequate volume   Culture   Final    NO GROWTH < 24 HOURS Performed at [facility], [address] [place], [place] [address]    Report Status PENDING  Incomplete  CULTURE, BLOOD (ROUTINE X 2) w Reflex to ID Panel     Status: None (Preliminary  result)   Collection Time: 08/28/20 11:42 AM   Specimen: BLOOD  Result Value Ref Range Status   Specimen Description BLOOD RIGHT HAND  Final   Special Requests   Final    BOTTLES DRAWN AEROBIC AND ANAEROBIC Blood Culture adequate volume   Culture   Final    NO GROWTH < 24 HOURS Performed at [facility], [address] [place], [place] [address]    Report Status PENDING  Incomplete    Coagulation Studies: No results for input(s): LABPROT, INR in the last 72 hours.  Urinalysis: No results for input(s): COLORURINE, LABSPEC, PHURINE, GLUCOSEU, HGBUR, BILIRUBINUR, KETONESUR, PROTEINUR, UROBILINOGEN, NITRITE, LEUKOCYTESUR in the last 72 hours.  Invalid input(s): APPERANCEUR    Imaging: US ARTERIAL ABI (SCREENING LOWER EXTREMITY)  Result Date: 08/28/2020 CLINICAL DATA:  59 year old male status post right second toe amputation. EXAM: NONINVASIVE PHYSIOLOGIC VASCULAR STUDY OF BILATERAL LOWER EXTREMITIES TECHNIQUE: Evaluation of both lower extremities were performed at rest, including calculation of ankle-brachial indices with single level Doppler, pressure and pulse volume recording. COMPARISON:  None. FINDINGS: Right ABI:  Unable to calculate. Left ABI:  Unable to calculate. Right Lower Extremity:  Normal arterial waveforms at the ankle. Left Lower Extremity:  Normal arterial waveforms at the ankle. > 1.4 Non diagnostic secondary to incompressible vessel calcifications (medial arterial sclerosis of [place]) IMPRESSION: Unable to calculate ankle-brachial indices due to noncompressible vessels the level of the ankle. Marliss Coots, MD [facility] Radiology Electronically Signed   By: Marliss Coots MD   On: 08/28/2020 11:44   ECHO TEE  Result Date: 08/28/2020    TRANSESOPHOGEAL ECHO REPORT   Patient Name:   Steve Vance Date of Exam: 08/28/2020 Medical Rec #:  [id number]     Height:       71.0 in Accession #:    [id number]    Weight:        254.0 lb Date of Birth:  1961-09-02    BSA:  2.334 m Patient Age:    61 years      BP:           153/76 mmHg Patient Gender: M             HR:           89 bpm. Exam Location:  [facility] Procedure: 2D Echo, Color Doppler and Cardiac Doppler Indications:     Bacteremia 790.7  History:         Patient has prior history of Echocardiogram examinations, most                  recent 03/29/2019. Risk Factors:Hypertension and Diabetes.  Sonographer:     Sherrie Sport RDCS (AE) Referring Phys:  [id number] Arvil Chaco Diagnosing Phys: Kathlyn Sacramento MD PROCEDURE: The transesophogeal probe was passed without difficulty through the esophogus of the patient. Local oropharyngeal anesthetic was provided with Benzocaine spray. Sedation performed by different physician. Image quality was good. The patient's vital signs; including heart rate, blood pressure, and oxygen saturation; remained stable throughout the procedure. The patient developed no complications during the procedure. IMPRESSIONS  1. Left ventricular ejection fraction, by estimation, is 55 to 60%. The left ventricle has normal function. The left ventricle has no regional wall motion abnormalities. There is mild left ventricular hypertrophy. Left ventricular diastolic function could not be evaluated.  2. Right ventricular systolic function is normal. The right ventricular size is normal.  3. No left atrial/left atrial appendage thrombus was detected.  4. The mitral valve is normal in structure. Trivial mitral valve regurgitation. No evidence of mitral stenosis.  5. The aortic valve is normal in structure. Aortic valve regurgitation is not visualized. No aortic stenosis is present.  6. Agitated saline contrast bubble study was negative, with no evidence of any interatrial shunt. Conclusion(s)/Recommendation(s): No evidence of vegetation/infective endocarditis on this transesophageal echocardiogram. FINDINGS  Left Ventricle: Left ventricular ejection fraction, by  estimation, is 55 to 60%. The left ventricle has normal function. The left ventricle has no regional wall motion abnormalities. The left ventricular internal cavity size was normal in size. There is  mild left ventricular hypertrophy. Left ventricular diastolic function could not be evaluated. Right Ventricle: The right ventricular size is normal. No increase in right ventricular wall thickness. Right ventricular systolic function is normal. Left Atrium: Left atrial size was normal in size. No left atrial/left atrial appendage thrombus was detected. Right Atrium: Right atrial size was normal in size. Pericardium: There is no evidence of pericardial effusion. Mitral Valve: The mitral valve is normal in structure. Trivial mitral valve regurgitation. No evidence of mitral valve stenosis. Tricuspid Valve: The tricuspid valve is normal in structure. Tricuspid valve regurgitation is trivial. No evidence of tricuspid stenosis. Aortic Valve: The aortic valve is normal in structure. Aortic valve regurgitation is not visualized. No aortic stenosis is present. Pulmonic Valve: The pulmonic valve was normal in structure. Pulmonic valve regurgitation is not visualized. No evidence of pulmonic stenosis. Aorta: The aortic root is normal in size and structure. IAS/Shunts: No atrial level shunt detected by color flow Doppler. Agitated saline contrast bubble study was negative, with no evidence of any interatrial shunt. Kathlyn Sacramento MD Electronically signed by Kathlyn Sacramento MD Signature Date/Time: 08/28/2020/10:50:45 AM    Final      Medications:   . sodium chloride 10 mL/hr at 08/28/20 9485   . amLODipine  10 mg Oral Daily  . atorvastatin  10 mg Oral QPM  . calcium acetate  1,334 mg Oral TID WC  . Chlorhexidine Gluconate Cloth  6 each Topical Daily  . epoetin (EPOGEN/PROCRIT) injection  4,000 Units Intravenous Q T,Th,Sa-HD  . heparin injection (subcutaneous)  5,000 Units Subcutaneous Q8H  . insulin aspart  0-6 Units  Subcutaneous TID WC  . lisinopril  20 mg Oral Daily  . metoprolol tartrate  50 mg Oral BID  . mupirocin ointment  1 application Nasal BID  . sertraline  100 mg Oral Daily  . vancomycin variable dose per unstable renal function (pharmacist dosing)   Does not apply See admin instructions   benzonatate, cyclobenzaprine, HYDROcodone-acetaminophen, metoCLOPramide (REGLAN) injection, morphine injection, ondansetron (ZOFRAN) IV  Assessment/ Plan:  59 y.o. male with end-stage renal disease, diabetes, hypertension, chronic back pain was admitted on 08/25/2020 for  Principal Problem:   Osteomyelitis of second toe of right foot ([place]) Active Problems:   Type 2 diabetes mellitus with hyperlipidemia ([place])   Essential hypertension   Obesity (BMI 30.0-34.9)   ESRD on dialysis ([place])   Systemic inflammatory response syndrome (SIRS) without organ dysfunction ([facility])   Sepsis due to methicillin resistant Staphylococcus aureus (MRSA) without acute organ dysfunction ([place])   Depression  Osteomyelitis of second toe of right foot ([place]) [M86.9] Sepsis, due to unspecified organism, unspecified whether acute organ dysfunction present ([place]) [A41.9]  #. ESRD [facility] /TTS/left arm AV fistula/255 minutes / 116 kg First day of dialysis April 02, 2019  Angiography planned for January 4 Next outpatient hemodialysis on Sunday (holiday schedule)   #. Anemia of CKD  Lab Results  Component Value Date   HGB 9.3 (L) 08/27/2020   Low dose EPO with HD  #. Secondary hyperparathyroidism of renal origin N 25.81      Component Value Date/Time   PTH 152 (H) 04/02/2019 2023   Lab Results  Component Value Date   PHOS 6.9 (H) 08/27/2020   Monitor calcium and phos level during this admission   #. Diabetes type 2 with CKD, peripheral neuropathy and peripheral rash Hgb A1c MFr Bld (%)  Date Value  08/25/2020 5.8 (H)   #Right second toe osteomyelitis Amputation and debridement done  08/26/2020 Broad-spectrum antibiotics -IV vancomycin We will plan on continuing vancomycin till January 20.  Message relayed to outpatient dialysis nurse   LOS: [place] 12/24/20212:52 PM  [facility] [place], [place]

## 2020-08-29 NOTE — Treatment Plan (Signed)
Diagnosis: MRSA bacteremia Baseline Creatinine ESRD  Culture Result: MRSA  Allergies  Allergen Reactions  . Baclofen Nausea And Vomiting  . Gabapentin Nausea And Vomiting    OPAT Orders Discharge antibiotics: Vancomycin 1 gram every Tuesday/Thursday/Saturday during dialysis Per pharmacy protocol Duration: 4 weeks End Date: 09/25/20   Labs weekly while on IV antibiotics: _X_ CBC with differential X__ CMP _X_ Vancomycin random   Fax weekly labs to [facility] [phone]  Clinic Follow Up Appt: 09/18/20 at 11.30am   Call [phone] with questions or concerns

## 2020-08-29 NOTE — Progress Notes (Signed)
Patient is stable and ready for discharge home. Patient's IV removed. Writer went over discharge paperwork with paper and answered all questions and concerns and verbalized understanding of information provided. Patient packed his belongings and dressed his self. NT transported patient via [place] to patient's significant other's  Car for discharge home. VSS.

## 2020-08-29 NOTE — Consult Note (Signed)
PHARMACY CONSULT NOTE FOR:  OUTPATIENT  PARENTERAL ANTIBIOTIC THERAPY (OPAT)  Indication: MRSA Bacteremia Regimen: Vancomycin 1000mg  every T/T/S during HD End date: 09/25/2020  IV antibiotic discharge orders are pended. To discharging provider:  please sign these orders via discharge navigator,  Select New Orders & click on the button choice - Manage This Unsigned Work.     Thank you for allowing pharmacy to be a part of this patient's care.  Lu Duffel, PharmD, BCPS Clinical Pharmacist 08/29/2020 8:46 AM

## 2020-08-30 LAB — CULTURE, BLOOD (ROUTINE X 2)
Culture: NO GROWTH
Special Requests: ADEQUATE

## 2020-08-30 LAB — HCV RNA QUANT
HCV Quantitative Log: 6.1 log10 IU/mL (ref >1.70)
HCV Quantitative: 1260000 IU/mL (ref >50)

## 2020-08-31 LAB — ANAEROBIC CULTURE

## 2020-08-31 LAB — GLUCOSE, CAPILLARY: Glucose-Capillary: 120 mg/dL — ABNORMAL HIGH (ref 70–99)

## 2020-09-02 LAB — CULTURE, BLOOD (ROUTINE X 2)
Culture: NO GROWTH
Culture: NO GROWTH
Special Requests: ADEQUATE
Special Requests: ADEQUATE

## 2020-09-03 LAB — HEPATITIS C GENOTYPE

## 2020-09-09 ENCOUNTER — Inpatient Hospital Stay
Admission: EM | Admit: 2020-09-09 | Discharge: 2020-09-12 | DRG: 177 | Disposition: A | Discharge status: Home / Self Care | Payer: Medicare HMO | Attending: Internal Medicine | Admitting: Internal Medicine

## 2020-09-09 ENCOUNTER — Other Ambulatory Visit: Payer: Self-pay

## 2020-09-09 ENCOUNTER — Emergency Department: Payer: Medicare HMO

## 2020-09-09 DIAGNOSIS — B9562 Methicillin resistant Staphylococcus aureus infection as the cause of diseases classified elsewhere: Secondary | ICD-10-CM | POA: Diagnosis present

## 2020-09-09 DIAGNOSIS — E1122 Type 2 diabetes mellitus with diabetic chronic kidney disease: Secondary | ICD-10-CM | POA: Diagnosis present

## 2020-09-09 DIAGNOSIS — Z79899 Other long term (current) drug therapy: Secondary | ICD-10-CM

## 2020-09-09 DIAGNOSIS — J1282 Pneumonia due to coronavirus disease 2019: Secondary | ICD-10-CM | POA: Diagnosis present

## 2020-09-09 DIAGNOSIS — R0602 Shortness of breath: Secondary | ICD-10-CM | POA: Diagnosis present

## 2020-09-09 DIAGNOSIS — F419 Anxiety disorder, unspecified: Secondary | ICD-10-CM | POA: Diagnosis present

## 2020-09-09 DIAGNOSIS — Z7982 Long term (current) use of aspirin: Secondary | ICD-10-CM | POA: Diagnosis not present

## 2020-09-09 DIAGNOSIS — J9601 Acute respiratory failure with hypoxia: Secondary | ICD-10-CM | POA: Diagnosis present

## 2020-09-09 DIAGNOSIS — Z8249 Family history of ischemic heart disease and other diseases of the circulatory system: Secondary | ICD-10-CM

## 2020-09-09 DIAGNOSIS — Z6833 Body mass index (BMI) 33.0-33.9, adult: Secondary | ICD-10-CM

## 2020-09-09 DIAGNOSIS — G894 Chronic pain syndrome: Secondary | ICD-10-CM | POA: Diagnosis present

## 2020-09-09 DIAGNOSIS — U071 COVID-19: Principal | ICD-10-CM | POA: Diagnosis present

## 2020-09-09 DIAGNOSIS — R778 Other specified abnormalities of plasma proteins: Secondary | ICD-10-CM

## 2020-09-09 DIAGNOSIS — E785 Hyperlipidemia, unspecified: Secondary | ICD-10-CM | POA: Diagnosis present

## 2020-09-09 DIAGNOSIS — Z792 Long term (current) use of antibiotics: Secondary | ICD-10-CM

## 2020-09-09 DIAGNOSIS — Z886 Allergy status to analgesic agent status: Secondary | ICD-10-CM

## 2020-09-09 DIAGNOSIS — Z89421 Acquired absence of other right toe(s): Secondary | ICD-10-CM

## 2020-09-09 DIAGNOSIS — E872 Acidosis: Secondary | ICD-10-CM | POA: Diagnosis present

## 2020-09-09 DIAGNOSIS — I248 Other forms of acute ischemic heart disease: Secondary | ICD-10-CM | POA: Diagnosis present

## 2020-09-09 DIAGNOSIS — Z992 Dependence on renal dialysis: Secondary | ICD-10-CM | POA: Diagnosis not present

## 2020-09-09 DIAGNOSIS — I120 Hypertensive chronic kidney disease with stage 5 chronic kidney disease or end stage renal disease: Secondary | ICD-10-CM | POA: Diagnosis present

## 2020-09-09 DIAGNOSIS — R7989 Other specified abnormal findings of blood chemistry: Secondary | ICD-10-CM | POA: Diagnosis present

## 2020-09-09 DIAGNOSIS — E871 Hypo-osmolality and hyponatremia: Secondary | ICD-10-CM | POA: Diagnosis present

## 2020-09-09 DIAGNOSIS — M170 Bilateral primary osteoarthritis of knee: Secondary | ICD-10-CM | POA: Diagnosis present

## 2020-09-09 DIAGNOSIS — R7881 Bacteremia: Secondary | ICD-10-CM | POA: Diagnosis present

## 2020-09-09 DIAGNOSIS — N2581 Secondary hyperparathyroidism of renal origin: Secondary | ICD-10-CM | POA: Diagnosis present

## 2020-09-09 DIAGNOSIS — D631 Anemia in chronic kidney disease: Secondary | ICD-10-CM | POA: Diagnosis present

## 2020-09-09 DIAGNOSIS — N186 End stage renal disease: Secondary | ICD-10-CM | POA: Diagnosis present

## 2020-09-09 DIAGNOSIS — E119 Type 2 diabetes mellitus without complications: Secondary | ICD-10-CM

## 2020-09-09 DIAGNOSIS — E8729 Other acidosis: Secondary | ICD-10-CM | POA: Diagnosis present

## 2020-09-09 LAB — CBC
HCT: 25.5 % — ABNORMAL LOW (ref 39.0–52.0)
Hemoglobin: 8.3 g/dL — ABNORMAL LOW (ref 13.0–17.0)
MCH: 28 pg (ref 26.0–34.0)
MCHC: 32.5 g/dL (ref 30.0–36.0)
MCV: 86.1 fL (ref 80.0–100.0)
Platelets: 184 10*3/uL (ref 150–400)
RBC: 2.96 MIL/uL — ABNORMAL LOW (ref 4.22–5.81)
RDW: 13.8 % (ref 11.5–15.5)
WBC: 5.7 10*3/uL (ref 4.0–10.5)
nRBC: 0 % (ref 0.0–0.2)

## 2020-09-09 LAB — PROTIME-INR
INR: 1.2 (ref 0.8–1.2)
Prothrombin Time: 14.4 seconds (ref 11.4–15.2)

## 2020-09-09 LAB — BASIC METABOLIC PANEL
Anion gap: 16 — ABNORMAL HIGH (ref 5–15)
BUN: 68 mg/dL — ABNORMAL HIGH (ref 8–23)
CO2: 19 mmol/L — ABNORMAL LOW (ref 22–32)
Calcium: 8.9 mg/dL (ref 8.9–10.3)
Chloride: 93 mmol/L — ABNORMAL LOW (ref 98–111)
Creatinine, Ser: 14.29 mg/dL — ABNORMAL HIGH (ref 0.61–1.24)
GFR, Estimated: 4 mL/min — ABNORMAL LOW (ref >60)
Glucose, Bld: 115 mg/dL — ABNORMAL HIGH (ref 70–99)
Potassium: 4.7 mmol/L (ref 3.5–5.1)
Sodium: 128 mmol/L — ABNORMAL LOW (ref 135–145)

## 2020-09-09 LAB — D-DIMER, QUANTITATIVE: D-Dimer, Quant: 3.68 ug/mL-FEU — ABNORMAL HIGH (ref 0.00–0.50)

## 2020-09-09 LAB — C-REACTIVE PROTEIN: CRP: 14.1 mg/dL — ABNORMAL HIGH (ref <1.0)

## 2020-09-09 LAB — BRAIN NATRIURETIC PEPTIDE: B Natriuretic Peptide: 690.8 pg/mL — ABNORMAL HIGH (ref 0.0–100.0)

## 2020-09-09 LAB — POC SARS CORONAVIRUS 2 AG -  ED: SARS Coronavirus 2 Ag: POSITIVE — AB

## 2020-09-09 LAB — APTT: aPTT: 48 seconds — ABNORMAL HIGH (ref 24–36)

## 2020-09-09 LAB — PROCALCITONIN: Procalcitonin: 1.83 ng/mL

## 2020-09-09 LAB — TROPONIN I (HIGH SENSITIVITY)
Troponin I (High Sensitivity): 299 ng/L (ref <18)
Troponin I (High Sensitivity): 277 ng/L (ref <18)

## 2020-09-09 LAB — FERRITIN: Ferritin: 490 ng/mL — ABNORMAL HIGH (ref 24–336)

## 2020-09-09 LAB — TRIGLYCERIDES: Triglycerides: 131 mg/dL (ref <150)

## 2020-09-09 LAB — LACTATE DEHYDROGENASE: LDH: 277 U/L — ABNORMAL HIGH (ref 98–192)

## 2020-09-09 LAB — FIBRINOGEN: Fibrinogen: 675 mg/dL — ABNORMAL HIGH (ref 210–475)

## 2020-09-09 IMAGING — CR DG CHEST 2V
1 series · 3 of 3 positions shown · non-contrast
Comparison: [DATE]

CLINICAL DATA: Shortness of breath

EXAM:
CHEST - 2 VIEW

[Series 1: w chest lat · 0.14mm/px · 3 of 3 slices shown]
[im 1/3]
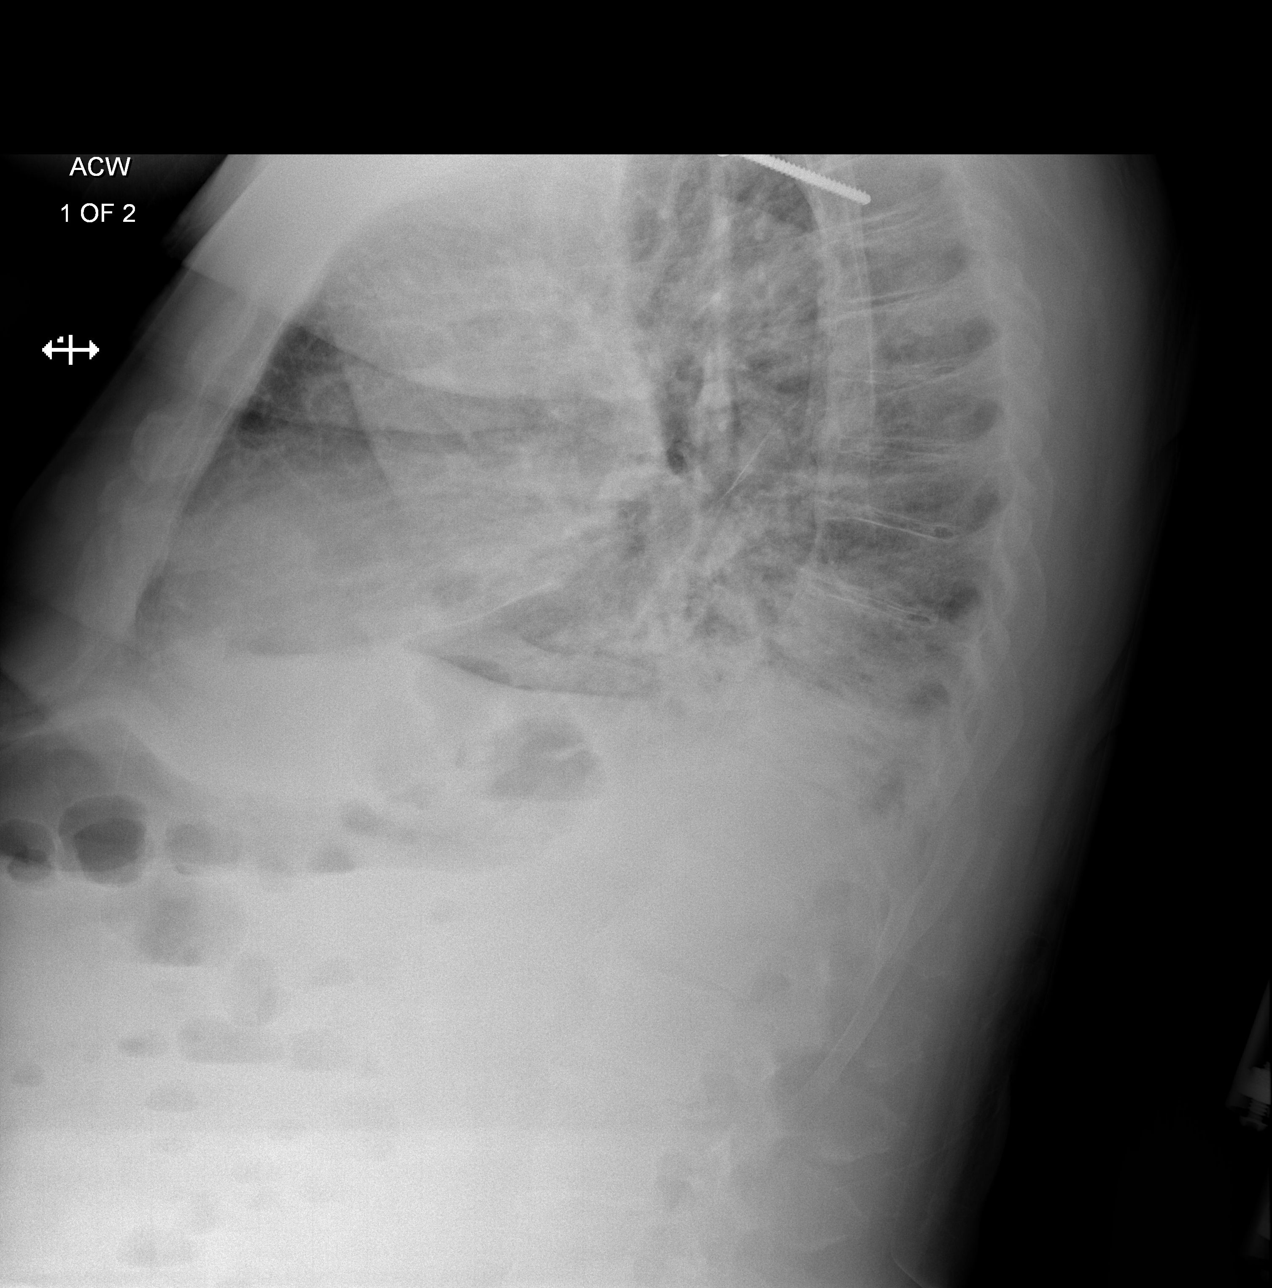
[im 2/3]
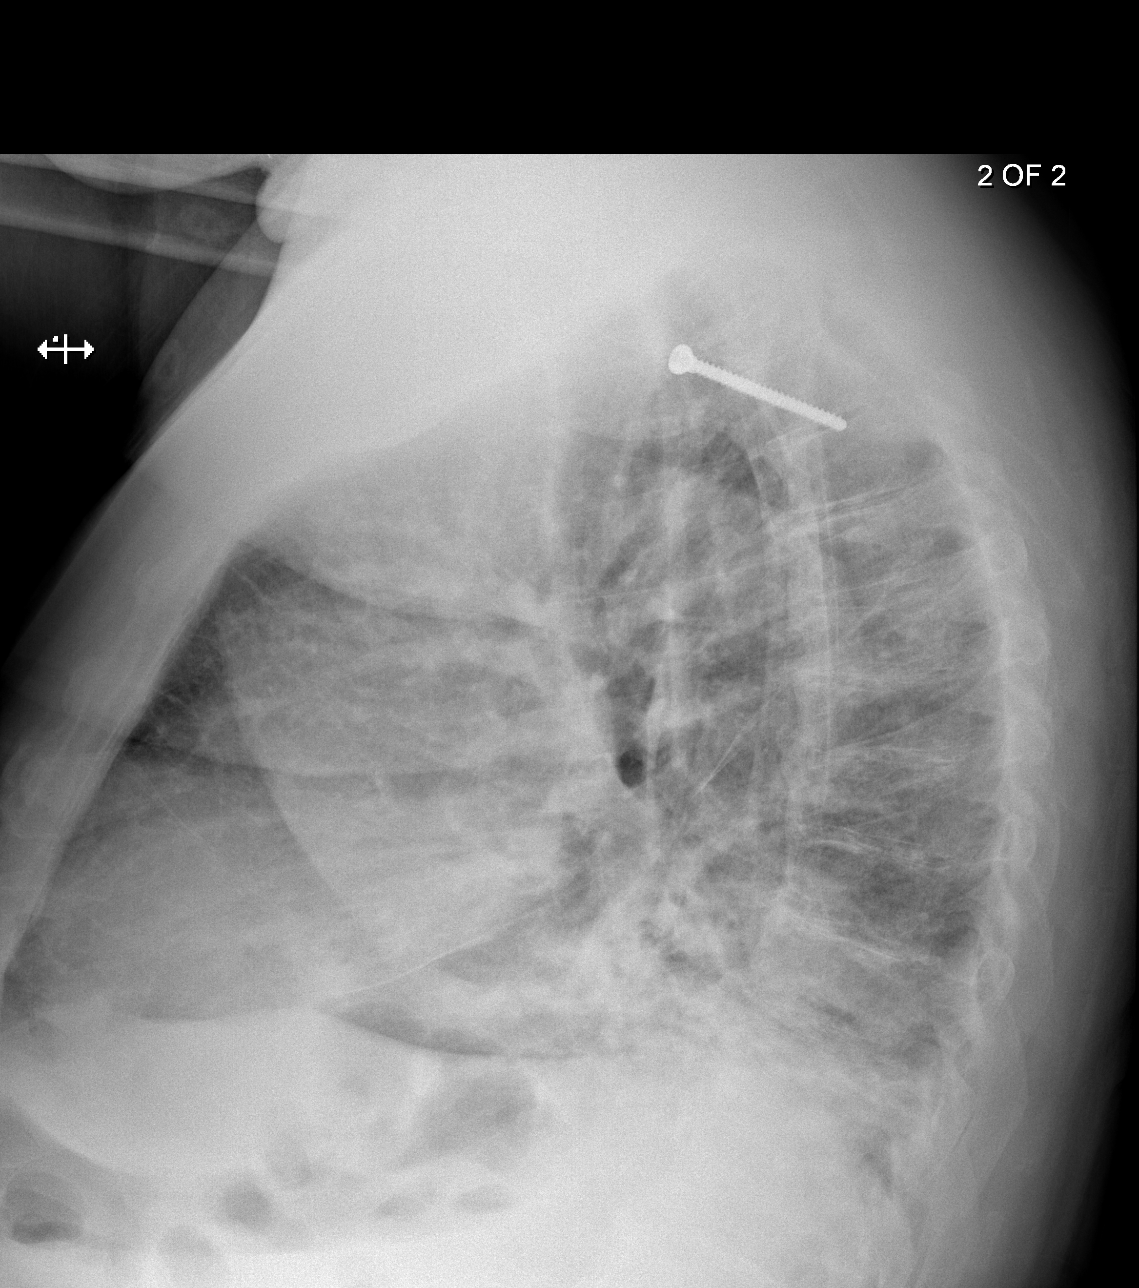
[im 3/3]
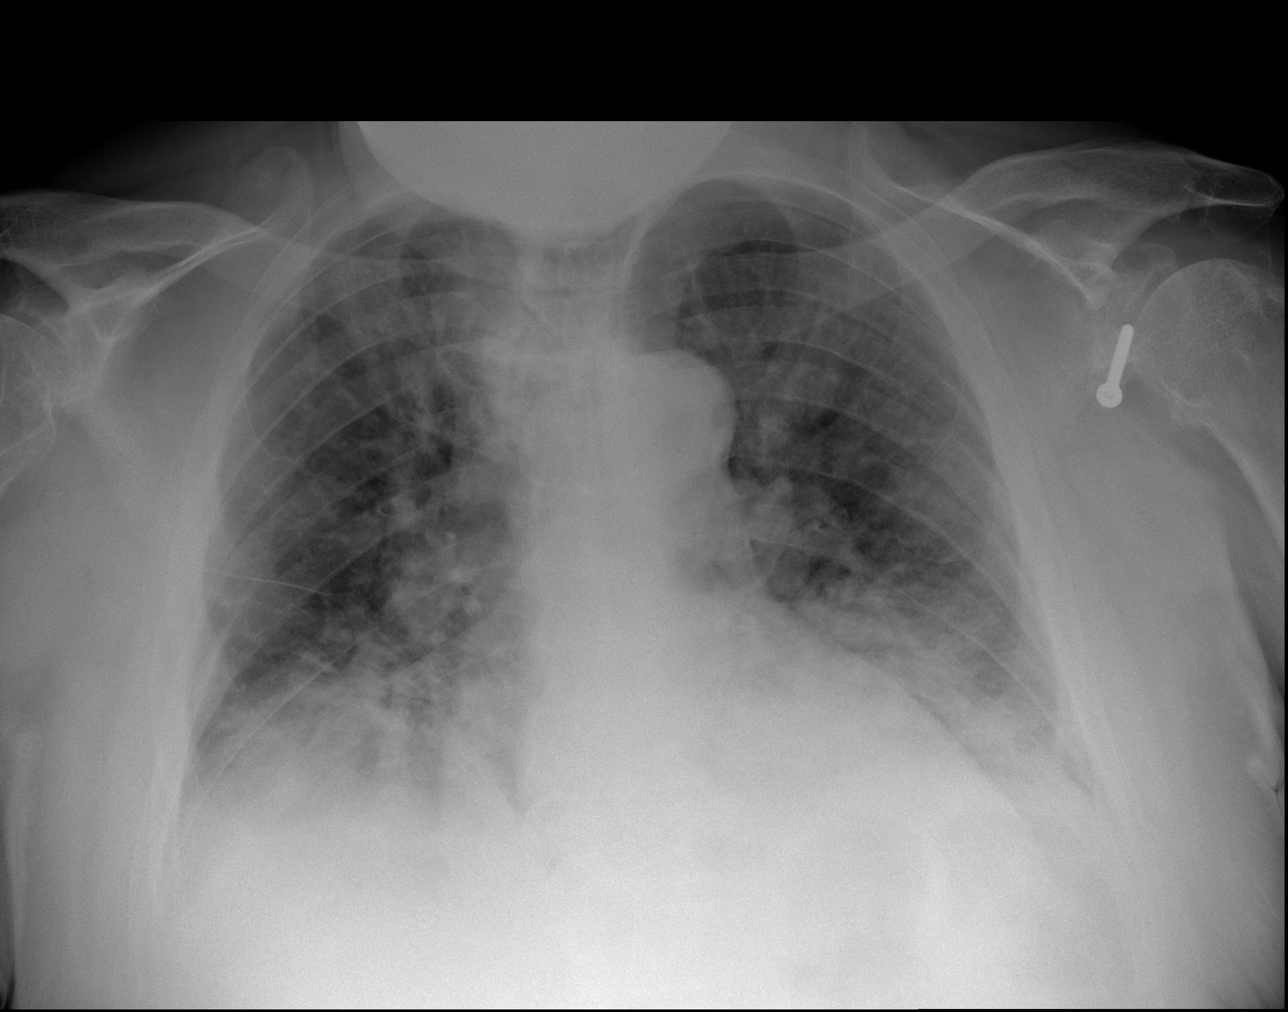

[3 of 3 positions shown; findings below may reference images not displayed]

FINDINGS: Heart size appears mildly enlarged, unchanged. Low lung volumes.
Pulmonary vascular congestion with diffuse interstitial prominence.
Patchy bibasilar airspace opacities, worsened from prior. Suspect
small bilateral pleural effusions. No pneumothorax.
IMPRESSION: 1. Findings suggestive of CHF with pulmonary edema.
2. Patchy bibasilar airspace opacities, which may reflect alveolar
edema versus pneumonia.

## 2020-09-09 MED ORDER — DEXAMETHASONE SODIUM PHOSPHATE 10 MG/ML IJ SOLN
10.0000 mg | Freq: Once | INTRAMUSCULAR | Status: AC
  Administered 2020-09-09: 10 mg via INTRAVENOUS
  Filled 2020-09-09: qty 1

## 2020-09-09 MED ORDER — DEXAMETHASONE 4 MG PO TABS
6.0000 mg | ORAL_TABLET | Freq: Every day | ORAL | Status: DC
  Administered 2020-09-11: 6 mg via ORAL
  Filled 2020-09-09 (×2): qty 1

## 2020-09-09 MED ORDER — HYDROCODONE-ACETAMINOPHEN 5-325 MG PO TABS
1.0000 | ORAL_TABLET | Freq: Four times a day (QID) | ORAL | Status: DC | PRN
  Administered 2020-09-11: 1 via ORAL
  Filled 2020-09-09: qty 1

## 2020-09-09 MED ORDER — METOPROLOL TARTRATE 50 MG PO TABS
50.0000 mg | ORAL_TABLET | Freq: Two times a day (BID) | ORAL | Status: DC
  Administered 2020-09-10 – 2020-09-12 (×4): 50 mg via ORAL
  Filled 2020-09-09 (×5): qty 1

## 2020-09-09 MED ORDER — ASPIRIN EC 81 MG PO TBEC
81.0000 mg | DELAYED_RELEASE_TABLET | Freq: Every day | ORAL | Status: DC
  Administered 2020-09-10 – 2020-09-12 (×3): 81 mg via ORAL
  Filled 2020-09-09 (×3): qty 1

## 2020-09-09 MED ORDER — SODIUM CHLORIDE 0.9 % IV SOLN
100.0000 mg | Freq: Every day | INTRAVENOUS | Status: DC
  Administered 2020-09-10 – 2020-09-12 (×3): 100 mg via INTRAVENOUS
  Filled 2020-09-09 (×4): qty 20

## 2020-09-09 MED ORDER — HEPARIN SODIUM (PORCINE) 5000 UNIT/ML IJ SOLN
5000.0000 [IU] | Freq: Three times a day (TID) | INTRAMUSCULAR | Status: DC
  Administered 2020-09-10 – 2020-09-12 (×6): 5000 [IU] via SUBCUTANEOUS
  Filled 2020-09-09 (×6): qty 1

## 2020-09-09 MED ORDER — ACETAMINOPHEN 650 MG RE SUPP: 650.0000 mg | Freq: Four times a day (QID) | RECTAL | Status: DC | PRN

## 2020-09-09 MED ORDER — LORAZEPAM 2 MG/ML IJ SOLN
1.0000 mg | Freq: Once | INTRAMUSCULAR | Status: AC
  Administered 2020-09-09: 1 mg via INTRAVENOUS
  Filled 2020-09-09: qty 1

## 2020-09-09 MED ORDER — ACETAMINOPHEN 325 MG PO TABS
650.0000 mg | ORAL_TABLET | Freq: Four times a day (QID) | ORAL | Status: DC | PRN
  Administered 2020-09-11: 325 mg via ORAL
  Filled 2020-09-09: qty 2

## 2020-09-09 MED ORDER — LISINOPRIL 20 MG PO TABS
20.0000 mg | ORAL_TABLET | Freq: Every day | ORAL | Status: DC
Start: 2020-09-09 — End: 2020-09-12
  Administered 2020-09-09 – 2020-09-12 (×4): 20 mg via ORAL
  Filled 2020-09-09: qty 1
  Filled 2020-09-09: qty 2
  Filled 2020-09-09: qty 1
  Filled 2020-09-09: qty 2

## 2020-09-09 MED ORDER — SODIUM CHLORIDE 0.9 % IV SOLN
200.0000 mg | Freq: Once | INTRAVENOUS | Status: AC
  Administered 2020-09-10: 200 mg via INTRAVENOUS
  Filled 2020-09-09: qty 200

## 2020-09-09 MED ORDER — ALBUTEROL SULFATE HFA 108 (90 BASE) MCG/ACT IN AERS
1.0000 | INHALATION_SPRAY | RESPIRATORY_TRACT | Status: DC | PRN
  Administered 2020-09-10: 2 via RESPIRATORY_TRACT
  Filled 2020-09-09 (×2): qty 6.7

## 2020-09-09 MED ORDER — ASPIRIN 325 MG PO TABS
325.0000 mg | ORAL_TABLET | Freq: Once | ORAL | Status: AC
  Administered 2020-09-09: 325 mg via ORAL
  Filled 2020-09-09: qty 1

## 2020-09-09 MED ORDER — AMLODIPINE BESYLATE 10 MG PO TABS
10.0000 mg | ORAL_TABLET | Freq: Every day | ORAL | Status: DC
  Administered 2020-09-09 – 2020-09-12 (×4): 10 mg via ORAL
  Filled 2020-09-09: qty 2
  Filled 2020-09-09 (×2): qty 1

## 2020-09-09 MED ORDER — INSULIN ASPART 100 UNIT/ML ~~LOC~~ SOLN
0.0000 [IU] | Freq: Three times a day (TID) | SUBCUTANEOUS | Status: DC
  Administered 2020-09-10: 1 [IU] via SUBCUTANEOUS
  Filled 2020-09-09: qty 1

## 2020-09-09 NOTE — Progress Notes (Signed)
Brief note regarding plan, with full H&P to follow:  Steve Vance is a 60 y.o. male with medical history significant for end-stage renal disease on hemodialysis on Tuesday, Thursday, Saturday schedule, anemia of chronic kidney disease with baseline hemoglobin 8-11, hypertension, type 2 diabetes mellitus, hyperlipidemia, recent diagnosis of MRSA bacteremia, chronic hyponatremia with baseline sodium of 131- 132, who is admitted to [facility] on 09/09/2020 with severe COVID-19 pneumonia after presenting from home to [facility] Department complaining of shortness of breath.  On remdesivir and Decadron.  The patient missed his regularly scheduled hemodialysis session earlier today.  I have consulted the on-call nephrologist, Dr. Juleen China, for assistance with arranging for the patient's hemodialysis during this hospitalization.     Babs Bertin, DO Hospitalist
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
## 2020-09-09 NOTE — ED Triage Notes (Addendum)
Pt comes via POV from home with c/o SOB, CP and cough. Pt states this started few days ago. Pt states central CP. Pt productive cough. Pt denies any radiation.  Pt states dialysis pt. Pt states treatments T,TH SAt and last treatment was Saturday.  Pt has labored breathing and is falling asleep in triage.

## 2020-09-09 NOTE — Consult Note (Signed)
Remdesivir - Pharmacy Brief Note   O:  ALT: NNL (08/25/2020 13) CXR: "Patchy bibasilar airspace opacities, which may reflect alveolar edema versus pneumonia" SpO2: 98% ORA    A/P:  Remdesivir 200 mg IVPB once followed by 100 mg IVPB daily x 4 days.   Lorna Dibble, [facility] 09/09/2020 5:35 PM

## 2020-09-10 DIAGNOSIS — E872 Acidosis: Secondary | ICD-10-CM | POA: Diagnosis present

## 2020-09-10 DIAGNOSIS — R778 Other specified abnormalities of plasma proteins: Secondary | ICD-10-CM | POA: Diagnosis present

## 2020-09-10 DIAGNOSIS — U071 COVID-19: Secondary | ICD-10-CM | POA: Diagnosis not present

## 2020-09-10 DIAGNOSIS — J1282 Pneumonia due to coronavirus disease 2019: Secondary | ICD-10-CM

## 2020-09-10 DIAGNOSIS — E8729 Other acidosis: Secondary | ICD-10-CM | POA: Diagnosis present

## 2020-09-10 DIAGNOSIS — R7989 Other specified abnormal findings of blood chemistry: Secondary | ICD-10-CM | POA: Diagnosis present

## 2020-09-10 DIAGNOSIS — R0602 Shortness of breath: Secondary | ICD-10-CM | POA: Diagnosis present

## 2020-09-10 LAB — COMPREHENSIVE METABOLIC PANEL
ALT: 15 U/L (ref 0–44)
AST: 38 U/L (ref 15–41)
Albumin: 2.9 g/dL — ABNORMAL LOW (ref 3.5–5.0)
Alkaline Phosphatase: 40 U/L (ref 38–126)
Anion gap: 18 — ABNORMAL HIGH (ref 5–15)
BUN: 85 mg/dL — ABNORMAL HIGH (ref 8–23)
CO2: 19 mmol/L — ABNORMAL LOW (ref 22–32)
Calcium: 8.9 mg/dL (ref 8.9–10.3)
Chloride: 91 mmol/L — ABNORMAL LOW (ref 98–111)
Creatinine, Ser: 15.2 mg/dL — ABNORMAL HIGH (ref 0.61–1.24)
GFR, Estimated: 3 mL/min — ABNORMAL LOW (ref >60)
Glucose, Bld: 181 mg/dL — ABNORMAL HIGH (ref 70–99)
Potassium: 5.2 mmol/L — ABNORMAL HIGH (ref 3.5–5.1)
Sodium: 128 mmol/L — ABNORMAL LOW (ref 135–145)
Total Bilirubin: 1.3 mg/dL — ABNORMAL HIGH (ref 0.3–1.2)
Total Protein: 8.6 g/dL — ABNORMAL HIGH (ref 6.5–8.1)

## 2020-09-10 LAB — CBC WITH DIFFERENTIAL/PLATELET
Abs Immature Granulocytes: 0.04 10*3/uL (ref 0.00–0.07)
Basophils Absolute: 0 10*3/uL (ref 0.0–0.1)
Basophils Relative: 0 %
Eosinophils Absolute: 0 10*3/uL (ref 0.0–0.5)
Eosinophils Relative: 0 %
HCT: 27.3 % — ABNORMAL LOW (ref 39.0–52.0)
Hemoglobin: 9 g/dL — ABNORMAL LOW (ref 13.0–17.0)
Immature Granulocytes: 1 %
Lymphocytes Relative: 13 %
Lymphs Abs: 0.6 10*3/uL — ABNORMAL LOW (ref 0.7–4.0)
MCH: 27.7 pg (ref 26.0–34.0)
MCHC: 33 g/dL (ref 30.0–36.0)
MCV: 84 fL (ref 80.0–100.0)
Monocytes Absolute: 0.2 10*3/uL (ref 0.1–1.0)
Monocytes Relative: 4 %
Neutro Abs: 3.5 10*3/uL (ref 1.7–7.7)
Neutrophils Relative %: 82 %
Platelets: 219 10*3/uL (ref 150–400)
RBC: 3.25 MIL/uL — ABNORMAL LOW (ref 4.22–5.81)
RDW: 13.9 % (ref 11.5–15.5)
WBC: 4.2 10*3/uL (ref 4.0–10.5)
nRBC: 0 % (ref 0.0–0.2)

## 2020-09-10 LAB — D-DIMER, QUANTITATIVE: D-Dimer, Quant: 3.68 ug/mL-FEU — ABNORMAL HIGH (ref 0.00–0.50)

## 2020-09-10 LAB — CBG MONITORING, ED
Glucose-Capillary: 165 mg/dL — ABNORMAL HIGH (ref 70–99)
Glucose-Capillary: 140 mg/dL — ABNORMAL HIGH (ref 70–99)
Glucose-Capillary: 132 mg/dL — ABNORMAL HIGH (ref 70–99)
Glucose-Capillary: 113 mg/dL — ABNORMAL HIGH (ref 70–99)

## 2020-09-10 LAB — CBC
HCT: 30.5 % — ABNORMAL LOW (ref 39.0–52.0)
Hemoglobin: 10 g/dL — ABNORMAL LOW (ref 13.0–17.0)
MCH: 27.9 pg (ref 26.0–34.0)
MCHC: 32.8 g/dL (ref 30.0–36.0)
MCV: 85 fL (ref 80.0–100.0)
Platelets: 234 10*3/uL (ref 150–400)
RBC: 3.59 MIL/uL — ABNORMAL LOW (ref 4.22–5.81)
RDW: 13.7 % (ref 11.5–15.5)
WBC: 7.9 10*3/uL (ref 4.0–10.5)
nRBC: 0 % (ref 0.0–0.2)

## 2020-09-10 LAB — FERRITIN: Ferritin: 662 ng/mL — ABNORMAL HIGH (ref 24–336)

## 2020-09-10 LAB — PROTIME-INR
INR: 1.2 (ref 0.8–1.2)
Prothrombin Time: 14.3 seconds (ref 11.4–15.2)

## 2020-09-10 LAB — FIBRINOGEN: Fibrinogen: >750 mg/dL — ABNORMAL HIGH (ref 210–475)

## 2020-09-10 LAB — TROPONIN I (HIGH SENSITIVITY): Troponin I (High Sensitivity): 194 ng/L (ref <18)

## 2020-09-10 LAB — VANCOMYCIN, RANDOM: Vancomycin Rm: 8

## 2020-09-10 LAB — PHOSPHORUS: Phosphorus: 8.9 mg/dL — ABNORMAL HIGH (ref 2.5–4.6)

## 2020-09-10 LAB — LACTATE DEHYDROGENASE: LDH: 234 U/L — ABNORMAL HIGH (ref 98–192)

## 2020-09-10 LAB — HIV ANTIBODY (ROUTINE TESTING W REFLEX): HIV Screen 4th Generation wRfx: NONREACTIVE

## 2020-09-10 LAB — SALICYLATE LEVEL: Salicylate Lvl: <7 mg/dL — ABNORMAL LOW (ref 7.0–30.0)

## 2020-09-10 LAB — C-REACTIVE PROTEIN: CRP: 15.9 mg/dL — ABNORMAL HIGH (ref <1.0)

## 2020-09-10 LAB — MAGNESIUM: Magnesium: 2.1 mg/dL (ref 1.7–2.4)

## 2020-09-10 MED ORDER — VANCOMYCIN HCL 2000 MG/400ML IV SOLN
2000.0000 mg | Freq: Once | INTRAVENOUS | Status: AC
  Administered 2020-09-10: 2000 mg via INTRAVENOUS
  Filled 2020-09-10: qty 400

## 2020-09-10 MED ORDER — LINAGLIPTIN 5 MG PO TABS
5.0000 mg | ORAL_TABLET | Freq: Every day | ORAL | Status: DC
  Administered 2020-09-10 – 2020-09-12 (×2): 5 mg via ORAL
  Filled 2020-09-10 (×3): qty 1

## 2020-09-10 MED ORDER — ONDANSETRON HCL 4 MG/2ML IJ SOLN
4.0000 mg | Freq: Once | INTRAMUSCULAR | Status: AC
  Administered 2020-09-10: 4 mg via INTRAVENOUS
  Filled 2020-09-10: qty 2

## 2020-09-10 MED ORDER — HYDROCOD POLST-CPM POLST ER 10-8 MG/5ML PO SUER
5.0000 mL | Freq: Two times a day (BID) | ORAL | Status: DC | PRN
  Administered 2020-09-10 (×2): 5 mL via ORAL
  Filled 2020-09-10 (×2): qty 5

## 2020-09-10 MED ORDER — VANCOMYCIN HCL IN DEXTROSE 1-5 GM/200ML-% IV SOLN: 1000.0000 mg | Freq: Once | INTRAVENOUS | Status: DC

## 2020-09-10 MED ORDER — EPOETIN ALFA 10000 UNIT/ML IJ SOLN: 10000.0000 [IU] | INTRAMUSCULAR | Status: DC

## 2020-09-10 MED ORDER — PROMETHAZINE HCL 25 MG/ML IJ SOLN
25.0000 mg | Freq: Four times a day (QID) | INTRAMUSCULAR | Status: DC | PRN
  Administered 2020-09-10 – 2020-09-12 (×2): 25 mg via INTRAVENOUS
  Filled 2020-09-10 (×2): qty 1

## 2020-09-10 MED ORDER — HYDRALAZINE HCL 20 MG/ML IJ SOLN
10.0000 mg | Freq: Four times a day (QID) | INTRAMUSCULAR | Status: DC | PRN
  Administered 2020-09-10: 10 mg via INTRAVENOUS
  Filled 2020-09-10: qty 1

## 2020-09-10 MED ORDER — EPOETIN ALFA 10000 UNIT/ML IJ SOLN
10000.0000 [IU] | INTRAMUSCULAR | Status: DC
  Administered 2020-09-11: 10000 [IU] via INTRAVENOUS
  Filled 2020-09-10: qty 1

## 2020-09-10 NOTE — ED Notes (Signed)
Iv team at bedside  

## 2020-09-10 NOTE — ED Notes (Addendum)
Pt provided with ice and cola per request. Denies further needs at this time.

## 2020-09-10 NOTE — ED Notes (Signed)
Lab called to come draw serum vancomycin level.

## 2020-09-10 NOTE — ED Notes (Signed)
Lab at bedside

## 2020-09-10 NOTE — ED Notes (Signed)
Lab at bedside to collect CBC.

## 2020-09-10 NOTE — ED Notes (Signed)
Messaged attending provider to request additional anti emetic medication as pt is vomiting again.

## 2020-09-10 NOTE — Progress Notes (Signed)
   09/10/20 2100  Clinical Encounter Type  Visited With Patient;Health care provider  Visit Type Initial;Spiritual support  Referral From Nurse  Spiritual Encounters  Spiritual Needs Prayer  Stress Factors  Patient Stress Factors Health changes   This chaplain received a referral to support the patient. Upon arrival, the patient was seated at the foot of his bed. He reported that he was feeling anxious/fearful about his health and his family's health. The patient requested prayer for the aforementioned concerns. Prayer offered for the explicit concerns shared by the patient as well as his unspoken prayer requests. Support additional support provided in the form of active and reflective listening and compassionate ministerial presence. No additional needs at this time.   Gennaro Africa, Chaplain

## 2020-09-10 NOTE — ED Notes (Signed)
Lab will come to draw CBC that was ordered for 1622.

## 2020-09-10 NOTE — Progress Notes (Addendum)
Pharmacy Antibiotic Note  Steve Vance is a 60 y.o. male admitted on 09/09/2020 with COVID.  Patient has past medication history of ESRD (HD Tu,Thurs, Sat), HTN, Uncontrolled DM, and obesity. Was recently admitted (08/25/2020) with osteomyelitis and MRSA bacteremia. Amputation and debridement done 08/26/2020 - resected margin clear of osteo. Per ID recs from last admission, patient to be on vancomycin until 09/25/2020 for a total of 4 weeks. Pharmacy has been consulted for Vancomycin dosing.  Patient missed his dialysis session on 1/4. Per discussion with nephrologist, plan is for patient to receive dialysis today (1/5)   Plan:  Will order Vancomycin $RemoveBeforeDE'1000mg'AViWYROJFCAUXtQ$  IV to be given today at the end of dialysis. Will follow up on vancomycin level for this afternoon and adjust if needed.   Follow up on dialysis plan for further vancomycin doses   Height: $Remove'5\' 11"'dRxlYGr$  (180.3 cm) Weight: 108.9 kg (240 lb) IBW/kg (Calculated) : 75.3  Temp (24hrs), Avg:99.1 F (37.3 C), Min:99.1 F (37.3 C), Max:99.1 F (37.3 C)  Recent Labs  Lab 09/09/20 1516 09/10/20 0722  WBC 5.7 4.2  CREATININE 14.29* 15.20*    Estimated Creatinine Clearance: 6.4 mL/min (A) (by C-G formula based on SCr of 15.2 mg/dL (H)).    Allergies  Allergen Reactions  . Baclofen Nausea And Vomiting  . Gabapentin Nausea And Vomiting    Antimicrobials this admission: 12/20 vancomycin >> to be continued until 09/25/2020  Microbiology results: 12/20 BCx: MRSA 12/21 MRSA PCR: (+) 12/21 Anaerobic culture: GPC   Thank you for allowing pharmacy to be a part of this patient's care.  Sherilyn Banker, PharmD Pharmacy Resident  09/10/2020 12:06 PM

## 2020-09-10 NOTE — Progress Notes (Signed)
[facility]  ROUNDING NOTE   Subjective:   Steve Vance was admitted to [facility] on 09/09/2020 for Pneumonia due to COVID-19 virus [U07.1, J12.82]  Last hemodialysis treatment was 12/30. He left 115kg as he states he has not been eating well.   Patient was having several days of shortness of breath, weakness and nonproductive cough.   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 Cleda Mccreedy on 08/26/20.     Objective:  Vital signs in last 24 hours:  Temp:  [99.1 F (37.3 C)] 99.1 F (37.3 C) (01/04 1507) Pulse Rate:  [74-107] 74 (01/05 1300) Resp:  [14-38] 22 (01/05 1300) BP: (167-201)/(91-145) 180/100 (01/05 1300) SpO2:  [82 %-100 %] 97 % (01/05 1314) Weight:  [108.9 kg] 108.9 kg (01/04 1508)  Weight change:  Filed Weights   09/09/20 1508  Weight: 108.9 kg    Intake/Output: No intake/output data recorded.   Intake/Output this shift:  Total I/O In: 240 [P.O.:240] Out: -   Physical Exam: General: NAD,   Head: Normocephalic, atraumatic. Moist oral mucosal membranes  Eyes: Anicteric, PERRL  Neck: Supple, trachea midline  Lungs:  Clear to auscultation  Heart: Regular rate and rhythm  Abdomen:  Soft, nontender,   Extremities:  no peripheral edema.  Neurologic: Nonfocal, moving all four extremities  Skin: No lesions  Access: Left AVF    Basic Metabolic Panel: Recent Labs  Lab 09/09/20 1516 09/10/20 0722  NA 128* 128*  K 4.7 5.2*  CL 93* 91*  CO2 19* 19*  GLUCOSE 115* 181*  BUN 68* 85*  CREATININE 14.29* 15.20*  CALCIUM 8.9 8.9  MG  --  2.1  PHOS  --  8.9*    Liver Function Tests: Recent Labs  Lab 09/10/20 0722  AST 38  ALT 15  ALKPHOS 40  BILITOT 1.3*  PROT 8.6*  ALBUMIN 2.9*   No results for input(s): LIPASE, AMYLASE in the last 168 hours. No results for input(s): AMMONIA in the last 168  hours.  CBC: Recent Labs  Lab 09/09/20 1516 09/10/20 0722  WBC 5.7 4.2  NEUTROABS  --  3.5  HGB 8.3* 9.0*  HCT 25.5* 27.3*  MCV 86.1 84.0  PLT 184 219    Cardiac Enzymes: No results for input(s): CKTOTAL, CKMB, CKMBINDEX, TROPONINI in the last 168 hours.  BNP: Invalid input(s): POCBNP  CBG: Recent Labs  Lab 09/10/20 0823  GLUCAP 165*    Microbiology: Results for orders placed or performed during the hospital encounter of 08/25/20  Resp Panel by RT-PCR (Flu A&B, Covid) Nasopharyngeal Swab     Status: None   Collection Time: 08/25/20 12:29 PM   Specimen: Nasopharyngeal Swab; Nasopharyngeal(NP) swabs in vial transport medium  Result Value Ref Range Status   SARS Coronavirus 2 by RT PCR NEGATIVE NEGATIVE Final    Comment: (NOTE) SARS-CoV-2 target nucleic acids are NOT DETECTED.  The SARS-CoV-2 RNA is generally detectable in upper respiratory specimens during the acute phase of infection. The lowest concentration of SARS-CoV-2 viral copies this assay can detect is 138 copies/mL. A negative result does not preclude SARS-Cov-2 infection and should not be used as the sole basis for treatment or other patient management decisions. A negative result may occur with  improper specimen collection/handling, submission of specimen other than nasopharyngeal swab, presence of viral mutation(s) within the areas targeted by this assay, and inadequate number  of viral copies(<138 copies/mL). A negative result must be combined with clinical observations, patient history, and epidemiological information. The expected result is Negative.  Fact Sheet for Patients:  EntrepreneurPulse.com.au  Fact Sheet for Healthcare Providers:  IncredibleEmployment.be  This test is no t yet approved or cleared by the [place] FDA and  has been authorized for detection and/or diagnosis of SARS-CoV-2 by FDA under an Emergency Use Authorization (EUA). This EUA  will remain  in effect (meaning this test can be used) for the duration of the COVID-19 declaration under Section 564(b)(1) of the Act, 21 U.S.C.section 360bbb-3(b)(1), unless the authorization is terminated  or revoked sooner.       Influenza A by PCR NEGATIVE NEGATIVE Final   Influenza B by PCR NEGATIVE NEGATIVE Final    Comment: (NOTE) The Xpert Xpress SARS-CoV-2/FLU/RSV plus assay is intended as an aid in the diagnosis of influenza from Nasopharyngeal swab specimens and should not be used as a sole basis for treatment. Nasal washings and aspirates are unacceptable for Xpert Xpress SARS-CoV-2/FLU/RSV testing.  Fact Sheet for Patients: EntrepreneurPulse.com.au  Fact Sheet for Healthcare Providers: IncredibleEmployment.be  This test is not yet approved or cleared by the [place] FDA and has been authorized for detection and/or diagnosis of SARS-CoV-2 by FDA under an Emergency Use Authorization (EUA). This EUA will remain in effect (meaning this test can be used) for the duration of the COVID-19 declaration under Section 564(b)(1) of the Act, 21 U.S.C. section 360bbb-3(b)(1), unless the authorization is terminated or revoked.  Performed at [facility], [address] [place], [place] [address]   Culture, blood (routine x 2)     Status: Abnormal   Collection Time: 08/25/20  1:35 PM   Specimen: BLOOD  Result Value Ref Range Status   Specimen Description   Final    BLOOD BLOOD RIGHT FOREARM Performed at [facility], [address] [place], [place] [address]    Special Requests   Final    BOTTLES DRAWN AEROBIC AND ANAEROBIC Blood Culture results may not be optimal due to an excessive volume of blood received in culture bottles Performed at [facility], [address] [place], [place] [address]    Culture  Setup Time   Final    GRAM POSITIVE COCCI IN BOTH AEROBIC AND ANAEROBIC BOTTLES Organism ID  to follow CRITICAL RESULT CALLED TO, READ BACK BY AND VERIFIED WITH: SCOTT HALL $RemoveBe'@0250'[email or web address]$  08/26/20 RH Performed at [facility], [address] [place], [place] [address]    Culture METHICILLIN RESISTANT STAPHYLOCOCCUS AUREUS (A)  Final   Report Status 08/28/2020 FINAL  Final   Organism ID, Bacteria METHICILLIN RESISTANT STAPHYLOCOCCUS AUREUS  Final      Susceptibility   Methicillin resistant staphylococcus aureus - MIC*    CIPROFLOXACIN <=0.5 SENSITIVE Sensitive     ERYTHROMYCIN <=0.25 SENSITIVE Sensitive     GENTAMICIN <=0.5 SENSITIVE Sensitive     OXACILLIN >=4 RESISTANT Resistant     TETRACYCLINE <=1 SENSITIVE Sensitive     VANCOMYCIN <=0.5 SENSITIVE Sensitive     TRIMETH/SULFA <=10 SENSITIVE Sensitive     CLINDAMYCIN <=0.25 SENSITIVE Sensitive     RIFAMPIN <=0.5 SENSITIVE Sensitive     Inducible Clindamycin NEGATIVE Sensitive     * METHICILLIN RESISTANT STAPHYLOCOCCUS AUREUS  Culture, blood (routine x 2)     Status: None   Collection Time: 08/25/20  1:35 PM   Specimen: BLOOD  Result Value Ref Range Status   Specimen Description BLOOD BLOOD RIGHT  HAND  Final   Special Requests   Final    BOTTLES DRAWN AEROBIC AND ANAEROBIC Blood Culture adequate volume   Culture   Final    NO GROWTH 5 DAYS Performed at [facility], [address] [place], [place] [address]    Report Status 08/30/2020 FINAL  Final  Blood Culture ID Panel (Reflexed)     Status: Abnormal   Collection Time: 08/25/20  1:35 PM  Result Value Ref Range Status   Enterococcus faecalis NOT DETECTED NOT DETECTED Final   Enterococcus Faecium NOT DETECTED NOT DETECTED Final   Listeria monocytogenes NOT DETECTED NOT DETECTED Final   Staphylococcus species DETECTED (A) NOT DETECTED Final    Comment: CRITICAL RESULT CALLED TO, READ BACK BY AND VERIFIED WITH: SCOTT HALL $RemoveBe'@0250'[id number]$  08/26/20 RH    Staphylococcus aureus (BCID) DETECTED (A) NOT DETECTED Final    Comment: Methicillin (oxacillin)-resistant  Staphylococcus aureus (MRSA). MRSA is predictably resistant to beta-lactam antibiotics (except ceftaroline). Preferred therapy is vancomycin unless clinically contraindicated. Patient requires contact precautions if  hospitalized. CRITICAL RESULT CALLED TO, READ BACK BY AND VERIFIED WITH: SCOTT HALL $RemoveBe'@0250'[email or web address]$  08/26/20 RH    Staphylococcus epidermidis NOT DETECTED NOT DETECTED Final   Staphylococcus lugdunensis NOT DETECTED NOT DETECTED Final   Streptococcus species NOT DETECTED NOT DETECTED Final   Streptococcus agalactiae NOT DETECTED NOT DETECTED Final   Streptococcus pneumoniae NOT DETECTED NOT DETECTED Final   Streptococcus pyogenes NOT DETECTED NOT DETECTED Final   A.calcoaceticus-baumannii NOT DETECTED NOT DETECTED Final   Bacteroides fragilis NOT DETECTED NOT DETECTED Final   Enterobacterales NOT DETECTED NOT DETECTED Final   Enterobacter cloacae complex NOT DETECTED NOT DETECTED Final   Escherichia coli NOT DETECTED NOT DETECTED Final   Klebsiella aerogenes NOT DETECTED NOT DETECTED Final   Klebsiella oxytoca NOT DETECTED NOT DETECTED Final   Klebsiella pneumoniae NOT DETECTED NOT DETECTED Final   Proteus species NOT DETECTED NOT DETECTED Final   Salmonella species NOT DETECTED NOT DETECTED Final   Serratia marcescens NOT DETECTED NOT DETECTED Final   Haemophilus influenzae NOT DETECTED NOT DETECTED Final   Neisseria meningitidis NOT DETECTED NOT DETECTED Final   Pseudomonas aeruginosa NOT DETECTED NOT DETECTED Final   Stenotrophomonas maltophilia NOT DETECTED NOT DETECTED Final   Candida albicans NOT DETECTED NOT DETECTED Final   Candida auris NOT DETECTED NOT DETECTED Final   Candida glabrata NOT DETECTED NOT DETECTED Final   Candida krusei NOT DETECTED NOT DETECTED Final   Candida parapsilosis NOT DETECTED NOT DETECTED Final   Candida tropicalis NOT DETECTED NOT DETECTED Final   Cryptococcus neoformans/gattii NOT DETECTED NOT DETECTED Final   Meth resistant mecA/C and MREJ  DETECTED (A) NOT DETECTED Final    Comment: CRITICAL RESULT CALLED TO, READ BACK BY AND VERIFIED WITH: SCOTT HALL $RemoveBe'@0250'[id number]$  08/26/20 RH Performed at [facility], [address], [place], [place] [address]   Surgical PCR screen     Status: Abnormal   Collection Time: 08/26/20  5:27 AM   Specimen: Nasal Mucosa; Nasal Swab  Result Value Ref Range Status   MRSA, PCR POSITIVE (A) NEGATIVE Final    Comment: RESULT CALLED TO, READ BACK BY AND VERIFIED WITH: CANDACE SUMMERS AT 2440 08/26/20 SDR    Staphylococcus aureus POSITIVE (A) NEGATIVE Final    Comment: (NOTE) The Xpert SA Assay (FDA approved for NASAL specimens in patients 86 years of age and older), is one component of a comprehensive surveillance program. It is not intended to diagnose infection nor to guide  or monitor treatment. Performed at [facility], [address] [place], [place] [address]   Anaerobic culture     Status: None   Collection Time: 08/26/20  5:27 PM   Specimen: PATH Bone biopsy; Tissue  Result Value Ref Range Status   Specimen Description   Final    BONE R DISTAL PHALANX BONE CULTURE Performed at [facility], [address], [place], [place] [address]    Special Requests   Final    NONE Performed at [facility], [address] [place], [place] [address]    Gram Stain   Final    MODERATE WBC PRESENT, PREDOMINANTLY PMN ABUNDANT GRAM POSITIVE COCCI    Culture   Final    ABUNDANT METHICILLIN RESISTANT STAPHYLOCOCCUS AUREUS NO ANAEROBES ISOLATED Performed at [facility], [place] [address] [place], [place] [address]    Report Status 08/31/2020 FINAL  Final   Organism ID, Bacteria METHICILLIN RESISTANT STAPHYLOCOCCUS AUREUS  Final      Susceptibility   Methicillin resistant staphylococcus aureus - MIC*    CIPROFLOXACIN <=0.5 SENSITIVE Sensitive     ERYTHROMYCIN <=0.25 SENSITIVE Sensitive     GENTAMICIN <=0.5 SENSITIVE Sensitive     OXACILLIN >=4 RESISTANT  Resistant     TETRACYCLINE <=1 SENSITIVE Sensitive     VANCOMYCIN 1 SENSITIVE Sensitive     TRIMETH/SULFA <=10 SENSITIVE Sensitive     CLINDAMYCIN <=0.25 SENSITIVE Sensitive     RIFAMPIN <=0.5 SENSITIVE Sensitive     Inducible Clindamycin NEGATIVE Sensitive     * ABUNDANT METHICILLIN RESISTANT STAPHYLOCOCCUS AUREUS  CULTURE, BLOOD (ROUTINE X 2) w Reflex to ID Panel     Status: None   Collection Time: 08/28/20 11:36 AM   Specimen: BLOOD  Result Value Ref Range Status   Specimen Description BLOOD RIGHT [facility]  Final   Special Requests   Final    BOTTLES DRAWN AEROBIC AND ANAEROBIC Blood Culture adequate volume   Culture   Final    NO GROWTH 5 DAYS Performed at [place] [facility], [address] [place], [place] [address]    Report Status 09/02/2020 FINAL  Final  CULTURE, BLOOD (ROUTINE X 2) w Reflex to ID Panel     Status: None   Collection Time: 08/28/20 11:42 AM   Specimen: BLOOD  Result Value Ref Range Status   Specimen Description BLOOD RIGHT HAND  Final   Special Requests   Final    BOTTLES DRAWN AEROBIC AND ANAEROBIC Blood Culture adequate volume   Culture   Final    NO GROWTH 5 DAYS Performed at [facility], [address] [place],  [address]    Report Status 09/02/2020 FINAL  Final    Coagulation Studies: Recent Labs    09/09/20 1516 09/10/20 0722  LABPROT 14.4 14.3  INR 1.2 1.2    Urinalysis: No results for input(s): COLORURINE, LABSPEC, PHURINE, GLUCOSEU, HGBUR, BILIRUBINUR, KETONESUR, PROTEINUR, UROBILINOGEN, NITRITE, LEUKOCYTESUR in the last 72 hours.  Invalid input(s): APPERANCEUR    Imaging: DG Chest 2 View  Result Date: 09/09/2020 CLINICAL DATA:  Shortness of breath EXAM: CHEST - 2 VIEW COMPARISON:  08/25/2020 FINDINGS: Heart size appears mildly enlarged, unchanged. Low lung volumes. Pulmonary vascular congestion with diffuse interstitial prominence. Patchy bibasilar airspace opacities, worsened from prior. Suspect small bilateral  pleural effusions. No pneumothorax. IMPRESSION: 1. Findings suggestive of CHF with pulmonary edema. 2. Patchy bibasilar airspace opacities, which may reflect alveolar edema versus pneumonia. Electronically Signed   By: Hart Carwin  Plundo D.O.   On: 09/09/2020 16:05     Medications:   . remdesivir 100 mg in NS 100 mL 100 mg (09/10/20 1307)  . vancomycin     . amLODipine  10 mg Oral Daily  . aspirin EC  81 mg Oral Daily  . dexamethasone  6 mg Oral q1800  . heparin  5,000 Units Subcutaneous Q8H  . insulin aspart  0-6 Units Subcutaneous TID WC  . linagliptin  5 mg Oral Daily  . lisinopril  20 mg Oral Daily  . metoprolol tartrate  50 mg Oral BID   acetaminophen **OR** acetaminophen, albuterol, chlorpheniramine-HYDROcodone, HYDROcodone-acetaminophen  Assessment/ Plan:  Steve Vance is a 60 y.o. black male with end stage renal disease on hemodialysis, hypertension, sleep apnea, diabetes mellitus type II, chronic back pain, right toe amputation who is admitted to [facility] on 09/09/2020 for Pneumonia due to COVID-19 virus [U07.1, J12.82]  [facility] TTS [place] Left AVF 115.5kg  1. End stage renal disease: missed two dialysis treatments. Scheduled for hemodialysis today and then resume TTS schedule.   2. Hypertension: elevated. History of difficult to control. Home regimen of metoprolol, lisinopril, amlodipine.   3. Anemia with chronic kidney disease: hemoglobin 9. - EPO on TTS HD treatments.   4. Secondary Hyperparathyroidism: outpatient PTH, calcium and phosphorus at goal.  - Calcium acetate with meals.   5. 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  - IV vancomycin 1gram until 1/20.    LOS: 1 Nilan Iddings 1/5/20221:47 PM

## 2020-09-10 NOTE — Progress Notes (Signed)
PROGRESS NOTE    Steve Vance  YSA:[id number] DOB: Nov 23, 1960 DOA: 09/09/2020 PCP: Theotis Burrow, MD    Brief Narrative:   Steve Vance is a 61 y.o. male with medical history significant for end-stage renal disease on hemodialysis on Tuesday, Thursday, Saturday schedule, anemia of chronic kidney disease with baseline hemoglobin 8-11, hypertension, type 2 diabetes mellitus, hyperlipidemia, recent diagnosis of MRSA bacteremia, chronic hyponatremia with baseline sodium of 131- 132, who is admitted to [facility] on 09/09/2020 with severe COVID-19 pneumonia after presenting from home to [facility] Department complaining of shortness of breath.  He missed his scheduled HD session since he was in the ER.  Additionally patient was diagnosed with MRSA bacteremia and is currently undergoing IV vancomycin treatment with every hemodialysis times a total of 12 doses with final dose scheduled to occur on 09/25/2020    Consultants:   Nephrology  Procedures: Hemodialysis  Antimicrobials:   I sent with dialysis, random severe   Subjective: Per nursing patient vomited after I saw him.  For me patient states he is mildly less short of breath.  No chest pain.  No abdominal pain  Objective: Vitals:   09/10/20 1300 09/10/20 1314 09/10/20 1400 09/10/20 1649  BP: (!) 180/100  (!) 177/102 (!) 168/93  Pulse: 74  75 86  Resp: (!) 22  18 (!) 30  Temp:      TempSrc:      SpO2: 92% 97% 100% 91%  Weight:      Height:        Intake/Output Summary (Last 24 hours) at 09/10/2020 1720 Last data filed at 09/10/2020 1713 Gross per 24 hour  Intake 240 ml  Output 150 ml  Net 90 ml   Filed Weights   09/09/20 1508  Weight: 108.9 kg    Examination:  General exam: Appears calm and comfortable  Respiratory system: +crackles b/l bases, no wheezing Cardiovascular system: S1 & S2 heard, RRR. No JVD, murmurs, rubs, gallops or clicks.  Gastrointestinal system: Abdomen is  nondistended, soft and nontender.. Normal bowel sounds heard. Central nervous system: Alert and oriented. No grossly intact Extremities: +edema with chronic skin changes Psychiatry:  Mood & affect appropriate in current setting.     Data Reviewed: I have personally reviewed following labs and imaging studies  CBC: Recent Labs  Lab 09/09/20 1516 09/10/20 0722  WBC 5.7 4.2  NEUTROABS  --  3.5  HGB 8.3* 9.0*  HCT 25.5* 27.3*  MCV 86.1 84.0  PLT 184 323   Basic Metabolic Panel: Recent Labs  Lab 09/09/20 1516 09/10/20 0722  NA 128* 128*  K 4.7 5.2*  CL 93* 91*  CO2 19* 19*  GLUCOSE 115* 181*  BUN 68* 85*  CREATININE 14.29* 15.20*  CALCIUM 8.9 8.9  MG  --  2.1  PHOS  --  8.9*   GFR: Estimated Creatinine Clearance: 6.4 mL/min (A) (by C-G formula based on SCr of 15.2 mg/dL (H)). Liver Function Tests: Recent Labs  Lab 09/10/20 0722  AST 38  ALT 15  ALKPHOS 40  BILITOT 1.3*  PROT 8.6*  ALBUMIN 2.9*   No results for input(s): LIPASE, AMYLASE in the last 168 hours. No results for input(s): AMMONIA in the last 168 hours. Coagulation Profile: Recent Labs  Lab 09/09/20 1516 09/10/20 0722  INR 1.2 1.2   Cardiac Enzymes: No results for input(s): CKTOTAL, CKMB, CKMBINDEX, TROPONINI in the last 168 hours. BNP (last 3 results) No results for input(s): PROBNP in the last  8760 hours. HbA1C: No results for input(s): HGBA1C in the last 72 hours. CBG: Recent Labs  Lab 09/10/20 0823 09/10/20 1300 09/10/20 1656  GLUCAP 165* 140* 132*   Lipid Profile: Recent Labs    09/09/20 1516  TRIG 131   Thyroid Function Tests: No results for input(s): TSH, T4TOTAL, FREET4, T3FREE, THYROIDAB in the last 72 hours. Anemia Panel: Recent Labs    09/09/20 1516 09/10/20 0722  FERRITIN 490* 662*   Sepsis Labs: Recent Labs  Lab 09/09/20 1516  PROCALCITON 1.83    No results found for this or any previous visit (from the past 240 hour(s)).       Radiology  Studies: DG Chest 2 View  Result Date: 09/09/2020 CLINICAL DATA:  Shortness of breath EXAM: CHEST - 2 VIEW COMPARISON:  08/25/2020 FINDINGS: Heart size appears mildly enlarged, unchanged. Low lung volumes. Pulmonary vascular congestion with diffuse interstitial prominence. Patchy bibasilar airspace opacities, worsened from prior. Suspect small bilateral pleural effusions. No pneumothorax. IMPRESSION: 1. Findings suggestive of CHF with pulmonary edema. 2. Patchy bibasilar airspace opacities, which may reflect alveolar edema versus pneumonia. Electronically Signed   By: Davina Poke D.O.   On: 09/09/2020 16:05        Scheduled Meds: . amLODipine  10 mg Oral Daily  . aspirin EC  81 mg Oral Daily  . dexamethasone  6 mg Oral q1800  . [START ON 09/12/2020] epoetin (EPOGEN/PROCRIT) injection  10,000 Units Intravenous Q M,W,F-HD  . heparin  5,000 Units Subcutaneous Q8H  . insulin aspart  0-6 Units Subcutaneous TID WC  . linagliptin  5 mg Oral Daily  . lisinopril  20 mg Oral Daily  . metoprolol tartrate  50 mg Oral BID   Continuous Infusions: . remdesivir 100 mg in NS 100 mL Stopped (09/10/20 1444)  . vancomycin    . vancomycin      Assessment & Plan:   Principal Problem:   Pneumonia due to COVID-19 virus Active Problems:   End stage renal disease ([place])   ESRD on dialysis ([place])   High anion gap metabolic acidosis   Elevated troponin   SOB (shortness of breath)   Steve Vance is a 60 y.o. male with medical history significant for end-stage renal disease on hemodialysis on Tuesday, Thursday, Saturday schedule, anemia of chronic kidney disease with baseline hemoglobin 8-11, hypertension, type 2 diabetes mellitus, hyperlipidemia, recent diagnosis of MRSA bacteremia, chronic hyponatremia with baseline sodium of 131- 132, who is admitted to [facility] on 09/09/2020 with severe COVID-19 pneumonia after presenting from home to [facility] Emergency Department complaining of  shortness of breath.    #) Severe COVID-19 pneumonia:  keep 02 support keep >92% continue IV remdesivir Continue IV steroids Airborne and contact precautions Monitor inflammatory markers Flutter valve and incentive spirometer     #) Acute hypoxic respiratory distress: Secondary to Covid pneumonia and mild volume overload since he missed his dialysis on the day of admission while he was in the ED Nephrology consulted plan for hemodialysis today TEE in December 21 with normal EF Continue management as above    #) Elevated troponin: Likely due to demand ischemia in the setting of acute hypoxic respiratory distress due to Covid pneumonia  Recent TEE echo with normal EF  No chest pain  Continue as above management  Continue aspirin, telemetry, trend troponin Continue statins and beta-blockers    #) Acute on chronic hypoosmolar hyponatremia: Relative to baseline serum sodium range of 131 132, with most recent prior  value 131 on 08/27/2020, presenting serum sodium of 128.  This is potentially multifactorial in nature, with potential contributing factors including an element of SIADH in the context of acute pulmonary infection stemming from severe COVID-19 pneumonia, as well as a potential contribution from possible volume overload due to missing dialysis  1/5-follow-up levels after dialysis  Nephrology following    #) Anion gap metabolic acidosis: in the setting of missed hemodialysis session and acute hypoxic respiratory failure Continue to monitor Hemodialysis today       #) Hyperlipidemia: On atorvastatin        #) Anemia of chronic kidney disease: Associated with a baseline hemoglobin range of 8-11, with most recent prior hemoglobin data point noted to be 9.3 when checked on 08/27/2020.  Continue to monitor   DVT prophylaxis: heparin Code Status: Full Family Communication: None at bedside  Status is: Inpatient  Remains inpatient  appropriate because:Inpatient level of care appropriate due to severity of illness   Dispo: The patient is from: Home              Anticipated d/c is to: Home              Anticipated d/c date is: > 3 days              Patient currently is not medically stable to d/c.            LOS: 1 day   Time spent: 35 min with >50% on coc    Nolberto Hanlon, MD [facility] Pager [phone]  If 7PM-7AM, please contact night-coverage 09/10/2020, 5:20 PM

## 2020-09-10 NOTE — ED Notes (Signed)
Secretary to page chaplain for pt at this time.

## 2020-09-10 NOTE — ED Notes (Signed)
Pt vomiting. Messaged attending to request $RemoveBe'4mg'qWLiSQegr$  IV zofran cosigned order.

## 2020-09-10 NOTE — Progress Notes (Signed)
Pharmacy Antibiotic Note  Steve Vance is a 60 y.o. male admitted on 09/09/2020 with COVID.  Patient has past medication history of ESRD (HD Tu,Thurs, Sat), HTN, Uncontrolled DM, and obesity. Was recently admitted (08/25/2020) with osteomyelitis and MRSA bacteremia. Amputation and debridement done 08/26/2020 - resected margin clear of osteo. Per ID recs from last admission, patient to be on vancomycin until 09/25/2020 for a total of 4 weeks. Pharmacy has been consulted for Vancomycin dosing.  Patient missed his dialysis session on 1/4. Per discussion with nephrologist, plan is for patient to receive dialysis today (1/5)   Plan:  Will order Vancomycin $RemoveBeforeDE'1000mg'[id number]$  IV to be given today at the end of dialysis. Will follow up on vancomycin level for this afternoon and adjust if needed.   Follow up on dialysis plan for further vancomycin doses  1/5:   Vanc 2 gm IV X 1 given @ 7096.   Spoke with RN who said pt will be going for HD later on on 1/6.  Will f/u plans for HD and dose vanc accordingly.    Height: $Remove'5\' 11"'JGSTYvP$  (180.3 cm) Weight: 108.9 kg (240 lb) IBW/kg (Calculated) : 75.3  Temp (24hrs), Avg:97.8 F (36.6 C), Min:97.8 F (36.6 C), Max:97.8 F (36.6 C)  Recent Labs  Lab 09/09/20 1516 09/10/20 0722 09/10/20 1100 09/10/20 1741  WBC 5.7 4.2  --  7.9  CREATININE 14.29* 15.20*  --   --   VANCORANDOM  --   --  8  --     Estimated Creatinine Clearance: 6.4 mL/min (A) (by C-G formula based on SCr of 15.2 mg/dL (H)).    Allergies  Allergen Reactions  . Baclofen Nausea And Vomiting  . Gabapentin Nausea And Vomiting    Antimicrobials this admission: 12/20 vancomycin >> to be continued until 09/25/2020  Microbiology results: 12/20 BCx: MRSA 12/21 MRSA PCR: (+) 12/21 Anaerobic culture: GPC   Thank you for allowing pharmacy to be a part of this patient's care.  Orene Desanctis, PharmD Pharmacy Resident  09/10/2020 11:03 PM

## 2020-09-10 NOTE — ED Notes (Signed)
Pt assisted back to bed from restroom. Pt short of breath after walking back to bed. PRN albuterol given with improvement.

## 2020-09-10 NOTE — ED Notes (Signed)
Pt given breakfast tray

## 2020-09-10 NOTE — ED Notes (Signed)
Dialysis nurse called, they will most likely take pt for dialysis in about 3 1/2 hours.

## 2020-09-10 NOTE — ED Notes (Signed)
Unable to obtain serum vancomycin draw. Will call lab to draw.

## 2020-09-10 NOTE — ED Notes (Signed)
Provided ice water per pt request.

## 2020-09-10 NOTE — ED Notes (Addendum)
Pt sitting up in bed. NAD noted at this time. Pt requesting chaplain at this time. Pt with wet cough at this time. Pt denies chest pain at this time. Denies further needs at this time.

## 2020-09-10 NOTE — Progress Notes (Addendum)
PIV consult: pt states he is unable to lie in bed with arm extended for optimal US guided PIV placement. Long 22g placed in R upper arm with pt sitting up. Will need to be lying in bed if IV Team is needed in the future. Pt requested to meet with chaplain. RN notified.

## 2020-09-10 NOTE — Progress Notes (Signed)
Pharmacy Antibiotic Note  Steve Vance is a 60 y.o. male admitted on 09/09/2020 with COVID.  Patient has past medication history of ESRD (HD Tu,Thurs, Sat), HTN, Uncontrolled DM, and obesity. Was recently admitted (08/25/2020) with osteomyelitis and MRSA bacteremia. Amputation and debridement done 08/26/2020 - resected margin clear of osteo. Per ID recs from last admission, patient to be on vancomycin until 09/25/2020 for a total of 4 weeks. Pharmacy has been consulted for Vancomycin dosing.  Patient missed his dialysis session on 1/4. Per discussion with nephrologist, plan is for patient to receive dialysis today (1/5).   Plan:  ADDENDUM: Ordered Vanc random level to assess outpatient regimen and despite missed HD session the level subtherapeutic returned at 30mcg/ml. Will reload patient with 2g now ahead of HD session and recheck level post-HD 4-6hrs after redistribution for further re-dosing.  Follow up Post-HD level and determine further Vanc redosing plan.    Height: $Remove'5\' 11"'RKKZPEX$  (180.3 cm) Weight: 108.9 kg (240 lb) IBW/kg (Calculated) : 75.3  No data recorded.  Recent Labs  Lab 09/09/20 1516 09/10/20 0722 09/10/20 1100  WBC 5.7 4.2  --   CREATININE 14.29* 15.20*  --   VANCORANDOM  --   --  8    Estimated Creatinine Clearance: 6.4 mL/min (A) (by C-G formula based on SCr of 15.2 mg/dL (H)).    Allergies  Allergen Reactions  . Baclofen Nausea And Vomiting  . Gabapentin Nausea And Vomiting    Antimicrobials this admission: 12/20 vancomycin >> to be continued until 09/25/2020  Microbiology results: 12/20 BCx: MRSA 12/21 MRSA PCR: (+) 12/21 Anaerobic culture: GPC   Thank you for allowing pharmacy to be a part of this patient's care.  Lorna Dibble, PharmD Pharmacy Resident  09/10/2020 5:08 PM

## 2020-09-10 NOTE — ED Notes (Signed)
IV team entered room and attempted IV placement. IV team member exited room and stated to ED tech that unable to place IV on this pt.

## 2020-09-10 NOTE — ED Notes (Signed)
Pt ambulatory to restroom with cane.

## 2020-09-10 NOTE — ED Notes (Signed)
Vancomycin had to be paused because only IV in R AC became infiltrated. IV team consult has been placed.

## 2020-09-10 NOTE — ED Notes (Signed)
Remdisivir will come from pharmacy. Will administer once available.

## 2020-09-10 NOTE — ED Notes (Signed)
Adjusted patient in bed. RN Joellen Jersey gave pt items family dropped off, and gave his debit card to family. Tech MG and HL witness giving debit card to visitor.

## 2020-09-10 NOTE — ED Notes (Signed)
Nasal cannula had fallen off. Pt remained 92-95% on RA. [place] reapplied but rate turned to 3L/min down from 6L/.min.

## 2020-09-11 DIAGNOSIS — J1282 Pneumonia due to coronavirus disease 2019: Secondary | ICD-10-CM | POA: Diagnosis not present

## 2020-09-11 DIAGNOSIS — U071 COVID-19: Secondary | ICD-10-CM | POA: Diagnosis not present

## 2020-09-11 LAB — COMPREHENSIVE METABOLIC PANEL
ALT: 16 U/L (ref 0–44)
AST: 41 U/L (ref 15–41)
Albumin: 2.8 g/dL — ABNORMAL LOW (ref 3.5–5.0)
Alkaline Phosphatase: 35 U/L — ABNORMAL LOW (ref 38–126)
Anion gap: 21 — ABNORMAL HIGH (ref 5–15)
BUN: 100 mg/dL — ABNORMAL HIGH (ref 8–23)
CO2: 14 mmol/L — ABNORMAL LOW (ref 22–32)
Calcium: 8.6 mg/dL — ABNORMAL LOW (ref 8.9–10.3)
Chloride: 90 mmol/L — ABNORMAL LOW (ref 98–111)
Creatinine, Ser: 15.63 mg/dL — ABNORMAL HIGH (ref 0.61–1.24)
GFR, Estimated: 3 mL/min — ABNORMAL LOW (ref >60)
Glucose, Bld: 96 mg/dL (ref 70–99)
Potassium: 5.1 mmol/L (ref 3.5–5.1)
Sodium: 125 mmol/L — ABNORMAL LOW (ref 135–145)
Total Bilirubin: 1.6 mg/dL — ABNORMAL HIGH (ref 0.3–1.2)
Total Protein: 7.9 g/dL (ref 6.5–8.1)

## 2020-09-11 LAB — FERRITIN: Ferritin: 806 ng/mL — ABNORMAL HIGH (ref 24–336)

## 2020-09-11 LAB — C-REACTIVE PROTEIN: CRP: 15.1 mg/dL — ABNORMAL HIGH (ref <1.0)

## 2020-09-11 LAB — GLUCOSE, CAPILLARY: Glucose-Capillary: 130 mg/dL — ABNORMAL HIGH (ref 70–99)

## 2020-09-11 LAB — CBG MONITORING, ED: Glucose-Capillary: 95 mg/dL (ref 70–99)

## 2020-09-11 MED ORDER — VANCOMYCIN HCL IN DEXTROSE 1-5 GM/200ML-% IV SOLN
1000.0000 mg | Freq: Once | INTRAVENOUS | Status: AC
  Administered 2020-09-11: 1000 mg via INTRAVENOUS

## 2020-09-11 MED ORDER — VANCOMYCIN HCL IN DEXTROSE 1-5 GM/200ML-% IV SOLN
1000.0000 mg | Freq: Once | INTRAVENOUS | Status: DC
  Filled 2020-09-11: qty 200

## 2020-09-11 NOTE — ED Notes (Signed)
Pt given crackers and applesauce as breakfast trays are delayed

## 2020-09-11 NOTE — ED Notes (Signed)
Pt repositioned in bed by this RN and Evelena Peat NT. HOB raised. Pt covered with blanket. Pt denies further needs at this time.

## 2020-09-11 NOTE — Progress Notes (Signed)
[facility]  ROUNDING NOTE   Subjective:   Seen and examined on hemodialysis treatment. Tolerating treatment well.     HEMODIALYSIS FLOWSHEET:  Blood Flow Rate (mL/min): 400 mL/min Arterial Pressure (mmHg): -150 mmHg Venous Pressure (mmHg): 160 mmHg Transmembrane Pressure (mmHg): 60 mmHg Ultrafiltration Rate (mL/min): 800 mL/min Dialysate Flow Rate (mL/min): 600 ml/min Conductivity: Machine : 14.3 Conductivity: Machine : 14.3 Dialysis Fluid Bolus: Normal Saline Bolus Amount (mL): 250 mL      Objective:  Vital signs in last 24 hours:  Temp:  [97.8 F (36.6 C)-98.7 F (37.1 C)] 98.7 F (37.1 C) (01/06 1033) Pulse Rate:  [74-109] 97 (01/06 1200) Resp:  [15-34] 22 (01/06 1200) BP: (116-229)/(79-196) 170/81 (01/06 1115) SpO2:  [89 %-100 %] 100 % (01/06 1033)  Weight change:  Filed Weights   09/09/20 1508  Weight: 108.9 kg    Intake/Output: I/O last 3 completed shifts: In: 240 [P.O.:240] Out: 150 [Urine:150]   Intake/Output this shift:  No intake/output data recorded.  Physical Exam: General: NAD, laying in bed  Head: Normocephalic, atraumatic. Moist oral mucosal membranes  Eyes: Anicteric, PERRL  Neck: Supple, trachea midline  Lungs:  Clear to auscultation  Heart: Regular rate and rhythm  Abdomen:  Soft, nontender,   Extremities:  no peripheral edema.  Neurologic: Nonfocal, moving all four extremities  Skin: No lesions  Access: Left AVF    Basic Metabolic Panel: Recent Labs  Lab 09/09/20 1516 09/10/20 0722 09/11/20 1030  NA 128* 128* 125*  K 4.7 5.2* 5.1  CL 93* 91* 90*  CO2 19* 19* 14*  GLUCOSE 115* 181* 96  BUN 68* 85* 100*  CREATININE 14.29* 15.20* 15.63*  CALCIUM 8.9 8.9 8.6*  MG  --  2.1  --   PHOS  --  8.9*  --     Liver Function Tests: Recent Labs  Lab 09/10/20 0722 09/11/20 1030  AST 38 41  ALT 15 16  ALKPHOS 40 35*  BILITOT 1.3* 1.6*  PROT 8.6* 7.9  ALBUMIN 2.9* 2.8*   No results for input(s): LIPASE,  AMYLASE in the last 168 hours. No results for input(s): AMMONIA in the last 168 hours.  CBC: Recent Labs  Lab 09/09/20 1516 09/10/20 0722 09/10/20 1741  WBC 5.7 4.2 7.9  NEUTROABS  --  3.5  --   HGB 8.3* 9.0* 10.0*  HCT 25.5* 27.3* 30.5*  MCV 86.1 84.0 85.0  PLT 184 219 234    Cardiac Enzymes: No results for input(s): CKTOTAL, CKMB, CKMBINDEX, TROPONINI in the last 168 hours.  BNP: Invalid input(s): POCBNP  CBG: Recent Labs  Lab 09/10/20 0823 09/10/20 1300 09/10/20 1656 09/10/20 2249 09/11/20 0803  GLUCAP 165* 140* 132* 113* 95    Microbiology: Results for orders placed or performed during the hospital encounter of 08/25/20  Resp Panel by RT-PCR (Flu A&B, Covid) Nasopharyngeal Swab     Status: None   Collection Time: 08/25/20 12:29 PM   Specimen: Nasopharyngeal Swab; Nasopharyngeal(NP) swabs in vial transport medium  Result Value Ref Range Status   SARS Coronavirus 2 by RT PCR NEGATIVE NEGATIVE Final    Comment: (NOTE) SARS-CoV-2 target nucleic acids are NOT DETECTED.  The SARS-CoV-2 RNA is generally detectable in upper respiratory specimens during the acute phase of infection. The lowest concentration of SARS-CoV-2 viral copies this assay can detect is 138 copies/mL. A negative result does not preclude SARS-Cov-2 infection and should not be used as the sole basis for treatment or other patient management decisions. A negative  result may occur with  improper specimen collection/handling, submission of specimen other than nasopharyngeal swab, presence of viral mutation(s) within the areas targeted by this assay, and inadequate number of viral copies(<138 copies/mL). A negative result must be combined with clinical observations, patient history, and epidemiological information. The expected result is Negative.  Fact Sheet for Patients:  EntrepreneurPulse.com.au  Fact Sheet for Healthcare Providers:   IncredibleEmployment.be  This test is no t yet approved or cleared by the [place] FDA and  has been authorized for detection and/or diagnosis of SARS-CoV-2 by FDA under an Emergency Use Authorization (EUA). This EUA will remain  in effect (meaning this test can be used) for the duration of the COVID-19 declaration under Section 564(b)(1) of the Act, 21 U.S.C.section 360bbb-3(b)(1), unless the authorization is terminated  or revoked sooner.       Influenza A by PCR NEGATIVE NEGATIVE Final   Influenza B by PCR NEGATIVE NEGATIVE Final    Comment: (NOTE) The Xpert Xpress SARS-CoV-2/FLU/RSV plus assay is intended as an aid in the diagnosis of influenza from Nasopharyngeal swab specimens and should not be used as a sole basis for treatment. Nasal washings and aspirates are unacceptable for Xpert Xpress SARS-CoV-2/FLU/RSV testing.  Fact Sheet for Patients: EntrepreneurPulse.com.au  Fact Sheet for Healthcare Providers: IncredibleEmployment.be  This test is not yet approved or cleared by the [place] FDA and has been authorized for detection and/or diagnosis of SARS-CoV-2 by FDA under an Emergency Use Authorization (EUA). This EUA will remain in effect (meaning this test can be used) for the duration of the COVID-19 declaration under Section 564(b)(1) of the Act, 21 U.S.C. section 360bbb-3(b)(1), unless the authorization is terminated or revoked.  Performed at [facility], [address] [place], [place] [address]   Culture, blood (routine x 2)     Status: Abnormal   Collection Time: 08/25/20  1:35 PM   Specimen: BLOOD  Result Value Ref Range Status   Specimen Description   Final    BLOOD BLOOD RIGHT FOREARM Performed at [facility], [address], [place], [place] [address]    Special Requests   Final    BOTTLES DRAWN AEROBIC AND ANAEROBIC Blood Culture results may not be optimal due to an  excessive volume of blood received in culture bottles Performed at [facility], [address], [place], [place] [address]    Culture  Setup Time   Final    GRAM POSITIVE COCCI IN BOTH AEROBIC AND ANAEROBIC BOTTLES Organism ID to follow CRITICAL RESULT CALLED TO, READ BACK BY AND VERIFIED WITH: SCOTT HALL $RemoveBe'[email or web address]$  08/26/20 RH Performed at [facility], [address] [place], [place] [address]    Culture METHICILLIN RESISTANT STAPHYLOCOCCUS AUREUS (A)  Final   Report Status 08/28/2020 FINAL  Final   Organism ID, Bacteria METHICILLIN RESISTANT STAPHYLOCOCCUS AUREUS  Final      Susceptibility   Methicillin resistant staphylococcus aureus - MIC*    CIPROFLOXACIN <=0.5 SENSITIVE Sensitive     ERYTHROMYCIN <=0.25 SENSITIVE Sensitive     GENTAMICIN <=0.5 SENSITIVE Sensitive     OXACILLIN >=4 RESISTANT Resistant     TETRACYCLINE <=1 SENSITIVE Sensitive     VANCOMYCIN <=0.5 SENSITIVE Sensitive     TRIMETH/SULFA <=10 SENSITIVE Sensitive     CLINDAMYCIN <=0.25 SENSITIVE Sensitive     RIFAMPIN <=0.5 SENSITIVE Sensitive     Inducible Clindamycin NEGATIVE Sensitive     * METHICILLIN RESISTANT STAPHYLOCOCCUS AUREUS  Culture, blood (routine x 2)  Status: None   Collection Time: 08/25/20  1:35 PM   Specimen: BLOOD  Result Value Ref Range Status   Specimen Description BLOOD BLOOD RIGHT HAND  Final   Special Requests   Final    BOTTLES DRAWN AEROBIC AND ANAEROBIC Blood Culture adequate volume   Culture   Final    NO GROWTH 5 DAYS Performed at [facility], [address] [place], [place] [address]    Report Status 08/30/2020 FINAL  Final  Blood Culture ID Panel (Reflexed)     Status: Abnormal   Collection Time: 08/25/20  1:35 PM  Result Value Ref Range Status   Enterococcus faecalis NOT DETECTED NOT DETECTED Final   Enterococcus Faecium NOT DETECTED NOT DETECTED Final   Listeria monocytogenes NOT DETECTED NOT DETECTED Final   Staphylococcus species DETECTED  (A) NOT DETECTED Final    Comment: CRITICAL RESULT CALLED TO, READ BACK BY AND VERIFIED WITH: SCOTT HALL $RemoveBe'@0250'[email or web address]$  08/26/20 RH    Staphylococcus aureus (BCID) DETECTED (A) NOT DETECTED Final    Comment: Methicillin (oxacillin)-resistant Staphylococcus aureus (MRSA). MRSA is predictably resistant to beta-lactam antibiotics (except ceftaroline). Preferred therapy is vancomycin unless clinically contraindicated. Patient requires contact precautions if  hospitalized. CRITICAL RESULT CALLED TO, READ BACK BY AND VERIFIED WITH: SCOTT HALL $RemoveBe'@0250'[email or web address]$  08/26/20 RH    Staphylococcus epidermidis NOT DETECTED NOT DETECTED Final   Staphylococcus lugdunensis NOT DETECTED NOT DETECTED Final   Streptococcus species NOT DETECTED NOT DETECTED Final   Streptococcus agalactiae NOT DETECTED NOT DETECTED Final   Streptococcus pneumoniae NOT DETECTED NOT DETECTED Final   Streptococcus pyogenes NOT DETECTED NOT DETECTED Final   A.calcoaceticus-baumannii NOT DETECTED NOT DETECTED Final   Bacteroides fragilis NOT DETECTED NOT DETECTED Final   Enterobacterales NOT DETECTED NOT DETECTED Final   Enterobacter cloacae complex NOT DETECTED NOT DETECTED Final   Escherichia coli NOT DETECTED NOT DETECTED Final   Klebsiella aerogenes NOT DETECTED NOT DETECTED Final   Klebsiella oxytoca NOT DETECTED NOT DETECTED Final   Klebsiella pneumoniae NOT DETECTED NOT DETECTED Final   Proteus species NOT DETECTED NOT DETECTED Final   Salmonella species NOT DETECTED NOT DETECTED Final   Serratia marcescens NOT DETECTED NOT DETECTED Final   Haemophilus influenzae NOT DETECTED NOT DETECTED Final   Neisseria meningitidis NOT DETECTED NOT DETECTED Final   Pseudomonas aeruginosa NOT DETECTED NOT DETECTED Final   Stenotrophomonas maltophilia NOT DETECTED NOT DETECTED Final   Candida albicans NOT DETECTED NOT DETECTED Final   Candida auris NOT DETECTED NOT DETECTED Final   Candida glabrata NOT DETECTED NOT DETECTED Final   Candida krusei NOT  DETECTED NOT DETECTED Final   Candida parapsilosis NOT DETECTED NOT DETECTED Final   Candida tropicalis NOT DETECTED NOT DETECTED Final   Cryptococcus neoformans/gattii NOT DETECTED NOT DETECTED Final   Meth resistant mecA/C and MREJ DETECTED (A) NOT DETECTED Final    Comment: CRITICAL RESULT CALLED TO, READ BACK BY AND VERIFIED WITH: SCOTT HALL $RemoveBe'@0250'[email or web address]$  08/26/20 RH Performed at [facility], [address] [place], [place] [address]   Surgical PCR screen     Status: Abnormal   Collection Time: 08/26/20  5:27 AM   Specimen: Nasal Mucosa; Nasal Swab  Result Value Ref Range Status   MRSA, PCR POSITIVE (A) NEGATIVE Final    Comment: RESULT CALLED TO, READ BACK BY AND VERIFIED WITH: CANDACE SUMMERS AT 2395 08/26/20 SDR    Staphylococcus aureus POSITIVE (A) NEGATIVE Final    Comment: (NOTE) The Xpert SA Assay (FDA approved for NASAL  specimens in patients 73 years of age and older), is one component of a comprehensive surveillance program. It is not intended to diagnose infection nor to guide or monitor treatment. Performed at [facility], [address], [place], [place] [address]   Anaerobic culture     Status: None   Collection Time: 08/26/20  5:27 PM   Specimen: PATH Bone biopsy; Tissue  Result Value Ref Range Status   Specimen Description   Final    BONE R DISTAL PHALANX BONE CULTURE Performed at [facility], [address], [place], [place] [address]    Special Requests   Final    NONE Performed at [facility], [address] [place], [place] [address]    Gram Stain   Final    MODERATE WBC PRESENT, PREDOMINANTLY PMN ABUNDANT GRAM POSITIVE COCCI    Culture   Final    ABUNDANT METHICILLIN RESISTANT STAPHYLOCOCCUS AUREUS NO ANAEROBES ISOLATED Performed at [facility], [place] [address], [place], [place] [address]    Report Status 08/31/2020 FINAL  Final   Organism ID, Bacteria METHICILLIN RESISTANT STAPHYLOCOCCUS AUREUS  Final       Susceptibility   Methicillin resistant staphylococcus aureus - MIC*    CIPROFLOXACIN <=0.5 SENSITIVE Sensitive     ERYTHROMYCIN <=0.25 SENSITIVE Sensitive     GENTAMICIN <=0.5 SENSITIVE Sensitive     OXACILLIN >=4 RESISTANT Resistant     TETRACYCLINE <=1 SENSITIVE Sensitive     VANCOMYCIN 1 SENSITIVE Sensitive     TRIMETH/SULFA <=10 SENSITIVE Sensitive     CLINDAMYCIN <=0.25 SENSITIVE Sensitive     RIFAMPIN <=0.5 SENSITIVE Sensitive     Inducible Clindamycin NEGATIVE Sensitive     * ABUNDANT METHICILLIN RESISTANT STAPHYLOCOCCUS AUREUS  CULTURE, BLOOD (ROUTINE X 2) w Reflex to ID Panel     Status: None   Collection Time: 08/28/20 11:36 AM   Specimen: BLOOD  Result Value Ref Range Status   Specimen Description BLOOD RIGHT [facility]  Final   Special Requests   Final    BOTTLES DRAWN AEROBIC AND ANAEROBIC Blood Culture adequate volume   Culture   Final    NO GROWTH 5 DAYS Performed at [facility], [address] [place], [place] [address]    Report Status 09/02/2020 FINAL  Final  CULTURE, BLOOD (ROUTINE X 2) w Reflex to ID Panel     Status: None   Collection Time: 08/28/20 11:42 AM   Specimen: BLOOD  Result Value Ref Range Status   Specimen Description BLOOD RIGHT HAND  Final   Special Requests   Final    BOTTLES DRAWN AEROBIC AND ANAEROBIC Blood Culture adequate volume   Culture   Final    NO GROWTH 5 DAYS Performed at [facility], [address] [place], [place] [address]    Report Status 09/02/2020 FINAL  Final    Coagulation Studies: Recent Labs    09/09/20 1516 09/10/20 0722  LABPROT 14.4 14.3  INR 1.2 1.2    Urinalysis: No results for input(s): COLORURINE, LABSPEC, PHURINE, GLUCOSEU, HGBUR, BILIRUBINUR, KETONESUR, PROTEINUR, UROBILINOGEN, NITRITE, LEUKOCYTESUR in the last 72 hours.  Invalid input(s): APPERANCEUR    Imaging: DG Chest 2 View  Result Date: 09/09/2020 CLINICAL DATA:  Shortness of breath EXAM: CHEST - 2 VIEW COMPARISON:   08/25/2020 FINDINGS: Heart size appears mildly enlarged, unchanged. Low lung volumes. Pulmonary vascular congestion with diffuse interstitial prominence. Patchy bibasilar airspace opacities, worsened from prior. Suspect small bilateral pleural effusions. No pneumothorax.  IMPRESSION: 1. Findings suggestive of CHF with pulmonary edema. 2. Patchy bibasilar airspace opacities, which may reflect alveolar edema versus pneumonia. Electronically Signed   By: Davina Poke D.O.   On: 09/09/2020 16:05     Medications:   . remdesivir 100 mg in NS 100 mL Stopped (09/10/20 1444)  . vancomycin 1,000 mg (09/11/20 1203)   . amLODipine  10 mg Oral Daily  . aspirin EC  81 mg Oral Daily  . dexamethasone  6 mg Oral q1800  . epoetin (EPOGEN/PROCRIT) injection  10,000 Units Intravenous Q T,Th,Sa-HD  . heparin  5,000 Units Subcutaneous Q8H  . insulin aspart  0-6 Units Subcutaneous TID WC  . linagliptin  5 mg Oral Daily  . lisinopril  20 mg Oral Daily  . metoprolol tartrate  50 mg Oral BID   acetaminophen **OR** acetaminophen, albuterol, chlorpheniramine-HYDROcodone, hydrALAZINE, HYDROcodone-acetaminophen, promethazine  Assessment/ Plan:  [name] is a 60 y.o. black male with end stage renal disease on hemodialysis, hypertension, sleep apnea, diabetes mellitus type II, chronic back pain, right toe amputation who is admitted to [facility] on 09/09/2020 for Pneumonia due to COVID-19 virus [U07.1, J12.82]  [facility] TTS [place] Left AVF 115.5kg  1. End stage renal disease:  Seen and examined on hemodialysis treatment. Continue TTS schedule.   2. Hypertension: elevated. History of difficult to control. Restarted on home regimen of metoprolol, lisinopril, amlodipine.   3. Anemia with chronic kidney disease: hemoglobin 10. - EPO on TTS HD treatments.   4. Secondary Hyperparathyroidism: outpatient PTH, calcium and phosphorus at goal.  - Calcium acetate with meals.   5. Osteomyelitis status post  amputation of right second toe.  - IV vancomycin 1gram until 1/20.    LOS: 2 Steve Vance 1/6/202212:08 PM

## 2020-09-11 NOTE — ED Notes (Signed)
When this RN entered room pt sitting on end of bed without monitors in place. Pt yelling that his back hurts. Pt helped back to bed, warm blankets placed behind back, tylenol given. Pt refused 650 mg, would only take 325 mg.. Pt with sats of 88-93% on room air. Pt placed back on O2 3 L [place] humidified

## 2020-09-11 NOTE — ED Notes (Signed)
Lab called for 0500 blood draw.

## 2020-09-11 NOTE — ED Notes (Signed)
Dialysis nurse notified that pt has a room and will go to room after dialysis.

## 2020-09-11 NOTE — ED Notes (Signed)
Will hold po and IV meds until after dialysis

## 2020-09-11 NOTE — ED Notes (Signed)
Report given to April, RN dialysis nurse

## 2020-09-11 NOTE — ED Notes (Signed)
bg [phone]

## 2020-09-11 NOTE — ED Notes (Signed)
Pt to dialysis.

## 2020-09-11 NOTE — Progress Notes (Signed)
Report via secure chat to [employer], rn at [phone].

## 2020-09-11 NOTE — ED Notes (Signed)
Lab unable to draw blood from pt, pt is difficult stick. MD notified

## 2020-09-11 NOTE — Progress Notes (Signed)
PROGRESS NOTE    Steve Vance  RPR:[id number] DOB: [birth year] DOA: 09/09/2020 PCP: Theotis Burrow, MD    Brief Narrative:   Steve Vance is a 60 y.o. male with medical history significant for end-stage renal disease on hemodialysis on Tuesday, Thursday, Saturday schedule, anemia of chronic kidney disease with baseline hemoglobin 8-11, hypertension, type 2 diabetes mellitus, hyperlipidemia, recent diagnosis of MRSA bacteremia, chronic hyponatremia with baseline sodium of 131- 132, who is admitted to [facility] on 09/09/2020 with severe COVID-19 pneumonia after presenting from home to [facility] complaining of shortness of breath.  He missed his scheduled HD session since he was in the ER.  Additionally patient was diagnosed with MRSA bacteremia and is currently undergoing IV vancomycin treatment with every hemodialysis times a total of 12 doses with final dose scheduled to occur on 09/25/2020  1/6-had HD today  Consultants:   Nephrology  Procedures: Hemodialysis  Antimicrobials:    Subjective: In HD. Reports feeling better than yesterday. Less sob , but not at baseline yet.  Objective: Vitals:   09/11/20 0745 09/11/20 0758 09/11/20 0800 09/11/20 0830  BP:    (!) 155/79  Pulse: (!) 109 (!) 108 (!) 107 97  Resp:  (!) 29 (!) 28 16  Temp:      TempSrc:      SpO2: 91% 90% (!) 89% 97%  Weight:      Height:        Intake/Output Summary (Last 24 hours) at 09/11/2020 0835 Last data filed at 09/10/2020 1713 Gross per 24 hour  Intake 240 ml  Output 150 ml  Net 90 ml   Filed Weights   09/09/20 1508  Weight: 108.9 kg    Examination: In dialysis, NAD, Positive scattered Rales at the bases, no wheezing Regular S1-S2 no rubs Soft benign positive bowel sounds Chronic skin changes of lower extremity, positive edema Alert oriented x3, grossly intact Mood and affect appropriate in current setting    Data Reviewed: I have personally  reviewed following labs and imaging studies  CBC: Recent Labs  Lab 09/09/20 1516 09/10/20 0722 09/10/20 1741  WBC 5.7 4.2 7.9  NEUTROABS  --  3.5  --   HGB 8.3* 9.0* 10.0*  HCT 25.5* 27.3* 30.5*  MCV 86.1 84.0 85.0  PLT 184 219 446   Basic Metabolic Panel: Recent Labs  Lab 09/09/20 1516 09/10/20 0722  NA 128* 128*  K 4.7 5.2*  CL 93* 91*  CO2 19* 19*  GLUCOSE 115* 181*  BUN 68* 85*  CREATININE 14.29* 15.20*  CALCIUM 8.9 8.9  MG  --  2.1  PHOS  --  8.9*   GFR: Estimated Creatinine Clearance: 6.4 mL/min (A) (by C-G formula based on SCr of 15.2 mg/dL (H)). Liver Function Tests: Recent Labs  Lab 09/10/20 0722  AST 38  ALT 15  ALKPHOS 40  BILITOT 1.3*  PROT 8.6*  ALBUMIN 2.9*   No results for input(s): LIPASE, AMYLASE in the last 168 hours. No results for input(s): AMMONIA in the last 168 hours. Coagulation Profile: Recent Labs  Lab 09/09/20 1516 09/10/20 0722  INR 1.2 1.2   Cardiac Enzymes: No results for input(s): CKTOTAL, CKMB, CKMBINDEX, TROPONINI in the last 168 hours. BNP (last 3 results) No results for input(s): PROBNP in the last 8760 hours. HbA1C: No results for input(s): HGBA1C in the last 72 hours. CBG: Recent Labs  Lab 09/10/20 0823 09/10/20 1300 09/10/20 1656 09/10/20 2249 09/11/20 0803  GLUCAP 165* 140* 132*  113* 95   Lipid Profile: Recent Labs    09/09/20 1516  TRIG 131   Thyroid Function Tests: No results for input(s): TSH, T4TOTAL, FREET4, T3FREE, THYROIDAB in the last 72 hours. Anemia Panel: Recent Labs    09/09/20 1516 09/10/20 0722  FERRITIN 490* 662*   Sepsis Labs: Recent Labs  Lab 09/09/20 1516  PROCALCITON 1.83    No results found for this or any previous visit (from the past 240 hour(s)).       Radiology Studies: DG Chest 2 View  Result Date: 09/09/2020 CLINICAL DATA:  Shortness of breath EXAM: CHEST - 2 VIEW COMPARISON:  08/25/2020 FINDINGS: Heart size appears mildly enlarged, unchanged. Low lung  volumes. Pulmonary vascular congestion with diffuse interstitial prominence. Patchy bibasilar airspace opacities, worsened from prior. Suspect small bilateral pleural effusions. No pneumothorax. IMPRESSION: 1. Findings suggestive of CHF with pulmonary edema. 2. Patchy bibasilar airspace opacities, which may reflect alveolar edema versus pneumonia. Electronically Signed   By: Davina Poke D.O.   On: 09/09/2020 16:05        Scheduled Meds: . amLODipine  10 mg Oral Daily  . aspirin EC  81 mg Oral Daily  . dexamethasone  6 mg Oral q1800  . epoetin (EPOGEN/PROCRIT) injection  10,000 Units Intravenous Q T,Th,Sa-HD  . heparin  5,000 Units Subcutaneous Q8H  . insulin aspart  0-6 Units Subcutaneous TID WC  . linagliptin  5 mg Oral Daily  . lisinopril  20 mg Oral Daily  . metoprolol tartrate  50 mg Oral BID   Continuous Infusions: . remdesivir 100 mg in NS 100 mL Stopped (09/10/20 1444)    Assessment & Plan:   Principal Problem:   Pneumonia due to COVID-19 virus Active Problems:   End stage renal disease ([place])   ESRD on dialysis ([place])   High anion gap metabolic acidosis   Elevated troponin   SOB (shortness of breath)   Steve Vance is a 60 y.o. male with medical history significant for end-stage renal disease on hemodialysis on Tuesday, Thursday, Saturday schedule, anemia of chronic kidney disease with baseline hemoglobin 8-11, hypertension, type 2 diabetes mellitus, hyperlipidemia, recent diagnosis of MRSA bacteremia, chronic hyponatremia with baseline sodium of 131- 132, who is admitted to [facility] on 09/09/2020 with severe COVID-19 pneumonia after presenting from home to [facility] Emergency Department complaining of shortness of breath.    #) Severe COVID-19 pneumonia:  Continue IV remdesivir  Continue IV steroids  Incentive spirometer and flutter valve  Trend inflammatory markers Keep O2 sats above 92%    #) Acute hypoxic respiratory distress:  Secondary to Covid pneumonia and mild volume overload since he missed his dialysis on the day of admission while he was in the ED 1/6-nephrology following, hemodialysis today  TEE recently with normal EF  Continue above management      #) Elevated troponin: Likely due to demand ischemia in the setting of acute hypoxic respiratory distress due to Covid pneumonia  Recent EF on TEE normal  No chest pain  Continue telemetry, aspirin  Continue statins have beta-blockers     #) Acute on chronic hypoosmolar hyponatremia: Relative to baseline serum sodium range of 131 132, with most recent prior value 131 on 08/27/2020, presenting serum sodium of 128.  This is potentially multifactorial in nature, with potential contributing factors including an element of SIADH in the context of acute pulmonary infection stemming from severe COVID-19 pneumonia, as well as a potential contribution from possible volume overload due to  missing dialysis  1/6-getting dialysis today We will continue to monitor levels If continues to fall we will discuss with nephrology    #) Anion gap metabolic acidosis: in the setting of missed hemodialysis session and acute hypoxic respiratory failure Improving continue to monitor      #) Hyperlipidemia: On atorvastatin        #) Anemia of chronic kidney disease: Associated with a baseline hemoglobin range of 8-11, with most recent prior hemoglobin data point noted to be 9.3 when checked on 08/27/2020.  Continue to monitor   DVT prophylaxis: heparin Code Status: Full Family Communication: None at bedside  Status is: Inpatient  Remains inpatient appropriate because:Inpatient level of care appropriate due to severity of illness   Dispo: The patient is from: Home              Anticipated d/c is to: Home              Anticipated d/c date is: > 3 days              Patient currently is not medically stable to d/c.            LOS: 2 days    Time spent: 35 min with >50% on coc    Nolberto Hanlon, MD [facility] Pager [phone]  If 7PM-7AM, please contact night-coverage 09/11/2020, 8:35 AM

## 2020-09-12 DIAGNOSIS — J1282 Pneumonia due to coronavirus disease 2019: Secondary | ICD-10-CM | POA: Diagnosis not present

## 2020-09-12 DIAGNOSIS — U071 COVID-19: Secondary | ICD-10-CM | POA: Diagnosis not present

## 2020-09-12 LAB — BASIC METABOLIC PANEL
Anion gap: 13 (ref 5–15)
BUN: 74 mg/dL — ABNORMAL HIGH (ref 8–23)
CO2: 25 mmol/L (ref 22–32)
Calcium: 8.8 mg/dL — ABNORMAL LOW (ref 8.9–10.3)
Chloride: 94 mmol/L — ABNORMAL LOW (ref 98–111)
Creatinine, Ser: 11.52 mg/dL — ABNORMAL HIGH (ref 0.61–1.24)
GFR, Estimated: 5 mL/min — ABNORMAL LOW (ref >60)
Glucose, Bld: 193 mg/dL — ABNORMAL HIGH (ref 70–99)
Potassium: 4.7 mmol/L (ref 3.5–5.1)
Sodium: 132 mmol/L — ABNORMAL LOW (ref 135–145)

## 2020-09-12 LAB — GLUCOSE, CAPILLARY
Glucose-Capillary: 125 mg/dL — ABNORMAL HIGH (ref 70–99)
Glucose-Capillary: 134 mg/dL — ABNORMAL HIGH (ref 70–99)

## 2020-09-12 LAB — TROPONIN I (HIGH SENSITIVITY): Troponin I (High Sensitivity): 89 ng/L — ABNORMAL HIGH (ref <18)

## 2020-09-12 MED ORDER — HYDROCOD POLST-CPM POLST ER 10-8 MG/5ML PO SUER: 5.0000 mL | Freq: Two times a day (BID) | ORAL | 0 refills | Status: AC | PRN

## 2020-09-12 MED ORDER — ALBUTEROL SULFATE HFA 108 (90 BASE) MCG/ACT IN AERS: 1.0000 | INHALATION_SPRAY | RESPIRATORY_TRACT | 0 refills | Status: DC | PRN

## 2020-09-12 MED ORDER — HYDRALAZINE HCL 25 MG PO TABS: 25.0000 mg | ORAL_TABLET | Freq: Three times a day (TID) | ORAL | 0 refills | Status: DC

## 2020-09-12 MED ORDER — HYDRALAZINE HCL 50 MG PO TABS
25.0000 mg | ORAL_TABLET | Freq: Three times a day (TID) | ORAL | Status: DC
  Administered 2020-09-12: 10:00:00 25 mg via ORAL
  Filled 2020-09-12: qty 1

## 2020-09-12 MED ORDER — SODIUM CHLORIDE 0.9 % IV SOLN: INTRAVENOUS | Status: DC | PRN

## 2020-09-12 MED ORDER — PREDNISONE 20 MG PO TABS: 20.0000 mg | ORAL_TABLET | Freq: Every day | ORAL | 0 refills | Status: AC

## 2020-09-12 MED ORDER — FAMOTIDINE 20 MG PO TABS: 20.0000 mg | ORAL_TABLET | Freq: Every day | ORAL | 0 refills | Status: DC

## 2020-09-12 MED ORDER — HYDRALAZINE HCL 10 MG PO TABS: 10.0000 mg | ORAL_TABLET | Freq: Three times a day (TID) | ORAL | Status: DC
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
## 2020-09-12 NOTE — Progress Notes (Signed)
[facility]  ROUNDING NOTE   Subjective:   Patient states his taste has returned.   Hemodialysis treatment yesterday. Tolerated treatment well. UF of 1.5 liters.    Objective:  Vital signs in last 24 hours:  Temp:  [97.6 F (36.4 C)-98.7 F (37.1 C)] 98.4 F (36.9 C) (01/07 1118) Pulse Rate:  [77-87] 86 (01/07 1118) Resp:  [18-19] 19 (01/07 1118) BP: (141-187)/(84-96) 187/95 (01/07 1118) SpO2:  [96 %-100 %] 100 % (01/07 1341)  Weight change:  Filed Weights   09/09/20 1508  Weight: 108.9 kg    Intake/Output: I/O last 3 completed shifts: In: 220 [P.O.:120; IV Piggyback:100] Out: 1500 [Other:1500]   Intake/Output this shift:  No intake/output data recorded.  Physical Exam: General: NAD, laying in bed  Head: Normocephalic, atraumatic. Moist oral mucosal membranes  Eyes: Anicteric, PERRL  Neck: Supple, trachea midline  Lungs:  Clear to auscultation  Heart: Regular rate and rhythm  Abdomen:  Soft, nontender,   Extremities:  no peripheral edema.  Neurologic: Nonfocal, moving all four extremities  Skin: No lesions  Access: Left AVF    Basic Metabolic Panel: Recent Labs  Lab 09/09/20 1516 09/10/20 0722 09/11/20 1030 09/12/20 0309  NA 128* 128* 125* 132*  K 4.7 5.2* 5.1 4.7  CL 93* 91* 90* 94*  CO2 19* 19* 14* 25  GLUCOSE 115* 181* 96 193*  BUN 68* 85* 100* 74*  CREATININE 14.29* 15.20* 15.63* 11.52*  CALCIUM 8.9 8.9 8.6* 8.8*  MG  --  2.1  --   --   PHOS  --  8.9*  --   --     Liver Function Tests: Recent Labs  Lab 09/10/20 0722 09/11/20 1030  AST 38 41  ALT 15 16  ALKPHOS 40 35*  BILITOT 1.3* 1.6*  PROT 8.6* 7.9  ALBUMIN 2.9* 2.8*   No results for input(s): LIPASE, AMYLASE in the last 168 hours. No results for input(s): AMMONIA in the last 168 hours.  CBC: Recent Labs  Lab 09/09/20 1516 09/10/20 0722 09/10/20 1741  WBC 5.7 4.2 7.9  NEUTROABS  --  3.5  --   HGB 8.3* 9.0* 10.0*  HCT 25.5* 27.3* 30.5*  MCV 86.1 84.0 85.0   PLT 184 219 234    Cardiac Enzymes: No results for input(s): CKTOTAL, CKMB, CKMBINDEX, TROPONINI in the last 168 hours.  BNP: Invalid input(s): POCBNP  CBG: Recent Labs  Lab 09/10/20 2249 09/11/20 0803 09/11/20 2003 09/12/20 0743 09/12/20 1146  GLUCAP 113* 95 130* 125* 134*    Microbiology: Results for orders placed or performed during the hospital encounter of 08/25/20  Resp Panel by RT-PCR (Flu A&B, Covid) Nasopharyngeal Swab     Status: None   Collection Time: 08/25/20 12:29 PM   Specimen: Nasopharyngeal Swab; Nasopharyngeal(NP) swabs in vial transport medium  Result Value Ref Range Status   SARS Coronavirus 2 by RT PCR NEGATIVE NEGATIVE Final    Comment: (NOTE) SARS-CoV-2 target nucleic acids are NOT DETECTED.  The SARS-CoV-2 RNA is generally detectable in upper respiratory specimens during the acute phase of infection. The lowest concentration of SARS-CoV-2 viral copies this assay can detect is 138 copies/mL. A negative result does not preclude SARS-Cov-2 infection and should not be used as the sole basis for treatment or other patient management decisions. A negative result may occur with  improper specimen collection/handling, submission of specimen other than nasopharyngeal swab, presence of viral mutation(s) within the areas targeted by this assay, and inadequate number of viral copies(<138  copies/mL). A negative result must be combined with clinical observations, patient history, and epidemiological information. The expected result is Negative.  Fact Sheet for Patients:  EntrepreneurPulse.com.au  Fact Sheet for Healthcare Providers:  IncredibleEmployment.be  This test is no t yet approved or cleared by the [place] FDA and  has been authorized for detection and/or diagnosis of SARS-CoV-2 by FDA under an Emergency Use Authorization (EUA). This EUA will remain  in effect (meaning this test can be used) for the  duration of the COVID-19 declaration under Section 564(b)(1) of the Act, 21 U.S.C.section 360bbb-3(b)(1), unless the authorization is terminated  or revoked sooner.       Influenza A by PCR NEGATIVE NEGATIVE Final   Influenza B by PCR NEGATIVE NEGATIVE Final    Comment: (NOTE) The Xpert Xpress SARS-CoV-2/FLU/RSV plus assay is intended as an aid in the diagnosis of influenza from Nasopharyngeal swab specimens and should not be used as a sole basis for treatment. Nasal washings and aspirates are unacceptable for Xpert Xpress SARS-CoV-2/FLU/RSV testing.  Fact Sheet for Patients: EntrepreneurPulse.com.au  Fact Sheet for Healthcare Providers: IncredibleEmployment.be  This test is not yet approved or cleared by the [place] FDA and has been authorized for detection and/or diagnosis of SARS-CoV-2 by FDA under an Emergency Use Authorization (EUA). This EUA will remain in effect (meaning this test can be used) for the duration of the COVID-19 declaration under Section 564(b)(1) of the Act, 21 U.S.C. section 360bbb-3(b)(1), unless the authorization is terminated or revoked.  Performed at [facility], [address] [place], [place] [address]   Culture, blood (routine x 2)     Status: Abnormal   Collection Time: 08/25/20  1:35 PM   Specimen: BLOOD  Result Value Ref Range Status   Specimen Description   Final    BLOOD BLOOD RIGHT FOREARM Performed at [facility], [address] [place], [place] [address]    Special Requests   Final    BOTTLES DRAWN AEROBIC AND ANAEROBIC Blood Culture results may not be optimal due to an excessive volume of blood received in culture bottles Performed at [facility], [address] [place], [place] [address]    Culture  Setup Time   Final    GRAM POSITIVE COCCI IN BOTH AEROBIC AND ANAEROBIC BOTTLES Organism ID to follow CRITICAL RESULT CALLED TO, READ BACK BY AND VERIFIED  WITH: SCOTT HALL $[email or web address]'[id number]$  08/26/20 RH Performed at [facility], [address] [place], [place] [address]    Culture METHICILLIN RESISTANT STAPHYLOCOCCUS AUREUS (A)  Final   Report Status 08/28/2020 FINAL  Final   Organism ID, Bacteria METHICILLIN RESISTANT STAPHYLOCOCCUS AUREUS  Final      Susceptibility   Methicillin resistant staphylococcus aureus - MIC*    CIPROFLOXACIN <=0.5 SENSITIVE Sensitive     ERYTHROMYCIN <=0.25 SENSITIVE Sensitive     GENTAMICIN <=0.5 SENSITIVE Sensitive     OXACILLIN >=4 RESISTANT Resistant     TETRACYCLINE <=1 SENSITIVE Sensitive     VANCOMYCIN <=0.5 SENSITIVE Sensitive     TRIMETH/SULFA <=10 SENSITIVE Sensitive     CLINDAMYCIN <=0.25 SENSITIVE Sensitive     RIFAMPIN <=0.5 SENSITIVE Sensitive     Inducible Clindamycin NEGATIVE Sensitive     * METHICILLIN RESISTANT STAPHYLOCOCCUS AUREUS  Culture, blood (routine x 2)     Status: None   Collection Time: 08/25/20  1:35 PM   Specimen: BLOOD  Result Value Ref Range Status   Specimen Description BLOOD BLOOD RIGHT HAND  Final  Special Requests   Final    BOTTLES DRAWN AEROBIC AND ANAEROBIC Blood Culture adequate volume   Culture   Final    NO GROWTH 5 DAYS Performed at [facility], [address] [place], [place] [address]    Report Status 08/30/2020 FINAL  Final  Blood Culture ID Panel (Reflexed)     Status: Abnormal   Collection Time: 08/25/20  1:35 PM  Result Value Ref Range Status   Enterococcus faecalis NOT DETECTED NOT DETECTED Final   Enterococcus Faecium NOT DETECTED NOT DETECTED Final   Listeria monocytogenes NOT DETECTED NOT DETECTED Final   Staphylococcus species DETECTED (A) NOT DETECTED Final    Comment: CRITICAL RESULT CALLED TO, READ BACK BY AND VERIFIED WITH: SCOTT HALL $RemoveBe'[email or web address]$  08/26/20 RH    Staphylococcus aureus (BCID) DETECTED (A) NOT DETECTED Final    Comment: Methicillin (oxacillin)-resistant Staphylococcus aureus (MRSA). MRSA is predictably resistant to  beta-lactam antibiotics (except ceftaroline). Preferred therapy is vancomycin unless clinically contraindicated. Patient requires contact precautions if  hospitalized. CRITICAL RESULT CALLED TO, READ BACK BY AND VERIFIED WITH: SCOTT HALL $RemoveBe'@0250'[email or web address]$  08/26/20 RH    Staphylococcus epidermidis NOT DETECTED NOT DETECTED Final   Staphylococcus lugdunensis NOT DETECTED NOT DETECTED Final   Streptococcus species NOT DETECTED NOT DETECTED Final   Streptococcus agalactiae NOT DETECTED NOT DETECTED Final   Streptococcus pneumoniae NOT DETECTED NOT DETECTED Final   Streptococcus pyogenes NOT DETECTED NOT DETECTED Final   A.calcoaceticus-baumannii NOT DETECTED NOT DETECTED Final   Bacteroides fragilis NOT DETECTED NOT DETECTED Final   Enterobacterales NOT DETECTED NOT DETECTED Final   Enterobacter cloacae complex NOT DETECTED NOT DETECTED Final   Escherichia coli NOT DETECTED NOT DETECTED Final   Klebsiella aerogenes NOT DETECTED NOT DETECTED Final   Klebsiella oxytoca NOT DETECTED NOT DETECTED Final   Klebsiella pneumoniae NOT DETECTED NOT DETECTED Final   Proteus species NOT DETECTED NOT DETECTED Final   Salmonella species NOT DETECTED NOT DETECTED Final   Serratia marcescens NOT DETECTED NOT DETECTED Final   Haemophilus influenzae NOT DETECTED NOT DETECTED Final   Neisseria meningitidis NOT DETECTED NOT DETECTED Final   Pseudomonas aeruginosa NOT DETECTED NOT DETECTED Final   Stenotrophomonas maltophilia NOT DETECTED NOT DETECTED Final   Candida albicans NOT DETECTED NOT DETECTED Final   Candida auris NOT DETECTED NOT DETECTED Final   Candida glabrata NOT DETECTED NOT DETECTED Final   Candida krusei NOT DETECTED NOT DETECTED Final   Candida parapsilosis NOT DETECTED NOT DETECTED Final   Candida tropicalis NOT DETECTED NOT DETECTED Final   Cryptococcus neoformans/gattii NOT DETECTED NOT DETECTED Final   Meth resistant mecA/C and MREJ DETECTED (A) NOT DETECTED Final    Comment: CRITICAL RESULT  CALLED TO, READ BACK BY AND VERIFIED WITH: SCOTT HALL $RemoveBe'[email or web address]'[id number]$  08/26/20 RH Performed at [facility], [address] [place], [place] [address]   Surgical PCR screen     Status: Abnormal   Collection Time: 08/26/20  5:27 AM   Specimen: Nasal Mucosa; Nasal Swab  Result Value Ref Range Status   MRSA, PCR POSITIVE (A) NEGATIVE Final    Comment: RESULT CALLED TO, READ BACK BY AND VERIFIED WITH: CANDACE SUMMERS AT 7096 08/26/20 SDR    Staphylococcus aureus POSITIVE (A) NEGATIVE Final    Comment: (NOTE) The Xpert SA Assay (FDA approved for NASAL specimens in patients 87 years of age and older), is one component of a comprehensive surveillance program. It is not intended to diagnose infection nor to guide or monitor treatment. Performed at  [address] [place], [place] [address]   Anaerobic culture     Status: None   Collection Time: 08/26/20  5:27 PM   Specimen: PATH Bone biopsy; Tissue  Result Value Ref Range Status   Specimen Description   Final    BONE R DISTAL PHALANX BONE CULTURE Performed at [facility], [address], [place], [place] [address]    Special Requests   Final    NONE Performed at [facility], [address] [place], [place] [address]    Gram Stain   Final    MODERATE WBC PRESENT, PREDOMINANTLY PMN ABUNDANT GRAM POSITIVE COCCI    Culture   Final    ABUNDANT METHICILLIN RESISTANT STAPHYLOCOCCUS AUREUS NO ANAEROBES ISOLATED Performed at [facility], [place] [address] [place], [place] [address]    Report Status 08/31/2020 FINAL  Final   Organism ID, Bacteria METHICILLIN RESISTANT STAPHYLOCOCCUS AUREUS  Final      Susceptibility   Methicillin resistant staphylococcus aureus - MIC*    CIPROFLOXACIN <=0.5 SENSITIVE Sensitive     ERYTHROMYCIN <=0.25 SENSITIVE Sensitive     GENTAMICIN <=0.5 SENSITIVE Sensitive     OXACILLIN >=4 RESISTANT Resistant     TETRACYCLINE <=1 SENSITIVE Sensitive      VANCOMYCIN 1 SENSITIVE Sensitive     TRIMETH/SULFA <=10 SENSITIVE Sensitive     CLINDAMYCIN <=0.25 SENSITIVE Sensitive     RIFAMPIN <=0.5 SENSITIVE Sensitive     Inducible Clindamycin NEGATIVE Sensitive     * ABUNDANT METHICILLIN RESISTANT STAPHYLOCOCCUS AUREUS  CULTURE, BLOOD (ROUTINE X 2) w Reflex to ID Panel     Status: None   Collection Time: 08/28/20 11:36 AM   Specimen: BLOOD  Result Value Ref Range Status   Specimen Description BLOOD RIGHT [facility]  Final   Special Requests   Final    BOTTLES DRAWN AEROBIC AND ANAEROBIC Blood Culture adequate volume   Culture   Final    NO GROWTH 5 DAYS Performed at [facility], [address] [place], [place] [address]    Report Status 09/02/2020 FINAL  Final  CULTURE, BLOOD (ROUTINE X 2) w Reflex to ID Panel     Status: None   Collection Time: 08/28/20 11:42 AM   Specimen: BLOOD  Result Value Ref Range Status   Specimen Description BLOOD RIGHT HAND  Final   Special Requests   Final    BOTTLES DRAWN AEROBIC AND ANAEROBIC Blood Culture adequate volume   Culture   Final    NO GROWTH 5 DAYS Performed at [facility], [address], [place], [place] [address]    Report Status 09/02/2020 FINAL  Final    Coagulation Studies: Recent Labs    09/10/20 0722  LABPROT 14.3  INR 1.2    Urinalysis: No results for input(s): COLORURINE, LABSPEC, PHURINE, GLUCOSEU, HGBUR, BILIRUBINUR, KETONESUR, PROTEINUR, UROBILINOGEN, NITRITE, LEUKOCYTESUR in the last 72 hours.  Invalid input(s): APPERANCEUR    Imaging: No results found.   Medications:   . sodium chloride    . remdesivir 100 mg in NS 100 mL 100 mg (09/12/20 0837)   . amLODipine  10 mg Oral Daily  . aspirin EC  81 mg Oral Daily  . dexamethasone  6 mg Oral q1800  . epoetin (EPOGEN/PROCRIT) injection  10,000 Units Intravenous Q T,Th,Sa-HD  . heparin  5,000 Units Subcutaneous Q8H  . hydrALAZINE  25 mg Oral Q8H  . insulin aspart  0-6 Units Subcutaneous TID WC  .  linagliptin  5 mg Oral Daily  . lisinopril  20 mg Oral Daily  . metoprolol tartrate  50 mg Oral BID   sodium chloride, acetaminophen **OR** acetaminophen, albuterol, chlorpheniramine-HYDROcodone, hydrALAZINE, HYDROcodone-acetaminophen, promethazine  Assessment/ Plan:  [name] is a 60 y.o. black male with end stage renal disease on hemodialysis, hypertension, sleep apnea, diabetes mellitus type II, chronic back pain, right toe amputation who is admitted to [facility] on 09/09/2020 for Morbid obesity ([place]) [E66.01] ESRD on hemodialysis ([place]) [N18.6, Z99.2] Type 2 diabetes mellitus without complication, without long-term current use of insulin ([place]) [E11.9] Pneumonia due to COVID-19 virus [U07.1, J12.82]  [facility] TTS [place] Left AVF 115.5kg  1. End stage renal disease:  Continue TTS schedule.  Outpatient cohort COVID-19 shift at [facility] TTS second shift. Patient made aware  2. Hypertension: elevated. History of difficult to control. Restarted on home regimen of metoprolol, lisinopril, amlodipine.   3. Anemia with chronic kidney disease:   - EPO on TTS HD treatments.   4. Secondary Hyperparathyroidism: outpatient PTH, calcium and phosphorus at goal.  - Calcium acetate with meals.   5. 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  - IV vancomycin 1gram until 1/20.    LOS: 3 Kathleen Likins 1/7/20224:33 PM

## 2020-09-12 NOTE — Care Management Important Message (Signed)
Important Message  Patient Details  Name: Steve Vance MRN: [id number] Date of Birth: [birth year]   Medicare Important Message Given:  Yes     Shelbie Hutching, RN 09/12/2020, 2:34 PM

## 2020-09-12 NOTE — Progress Notes (Signed)
Hemodialysis patient known at [facility], due to [place] positive results, patient will now treat at [facility] TTS 12:45. Spoke with this patient on Wednesday and is aware. Please contact me when patient is DC ready so that the clinic can be informed when patient will start with them.   Elvera Bicker Dialysis Coordinator [phone]

## 2020-09-12 NOTE — Progress Notes (Signed)
Received MD order to to discharge patient to home, reviewed discharge instructions, home meds, prescriptions and follow up appointments with patient and patient verbalized understanding

## 2020-09-12 NOTE — Discharge Summary (Signed)
Steve Vance KMM:[id number] DOB: 05-25-1961 DOA: 09/09/2020  PCP: Theotis Burrow, MD  Admit date: 09/09/2020 Discharge date: 09/12/2020  Admitted From: home  Disposition:  home  Recommendations for Outpatient Follow-up:  1. Follow up with PCP in 1 week 2. Please obtain BMP/CBC in one week 3.Due to COVID positive results, patient will now treat at [facility] 12:45pm.     Discharge Condition:Stable CODE STATUS: Full Diet recommendation: Renal diet   Brief/Interim Summary:  Per BXU:XYBFXOV Chaplin is a 60 y.o. male with medical history significant for end-stage renal disease on hemodialysis on Tuesday, Thursday, Saturday schedule, anemia of chronic kidney disease with baseline hemoglobin 8-11, hypertension, type 2 diabetes mellitus, hyperlipidemia, recent diagnosis of MRSA bacteremia, chronic hyponatremia with baseline sodium of 131- 132, who is admitted to [facility] on 09/09/2020 with severe COVID-19 pneumonia after presenting from home to [facility] Emergency Department complaining of shortness of breath. He missed his scheduled HD session today while in the emergency department for evaluation of the above shortness of breath. His end-stage renal disease is complicated by anemia of chronic kidney disease with baseline hemoglobin of 8-11. Denied any recent melena or hematochezia. Additionally, the patient was recently diagnosed with MRSA bacteremia, and is currently undergoing IV vancomycin treatments with every hemodialysis, times a total of 12 doses, with final dose scheduled to occur on 09/25/2020. Initial oxygen saturation 91% on room air, which is improved to 99% 2 L nasal cannula .Nephrology was consulted. Patient received hemodialysis. Was weaned off oxygen on room air with 02 sat in 90's.  #)Severe COVID-19 pneumonia: Received Remdesivir and iv steroid. Off O2 on RA , 02 sat stabl.e Inflammatory markers pending Will go home with steroid  taper    #) Acute hypoxic respiratory distress: Secondary to Covid pneumonia and volume overload since he missed his dialysis  Nephrology was consulted. Patient had hemodialysis. With improvement of his volume status     #) Elevated troponin: Likely due to demand ischemia in the setting of acute hypoxic respiratory distress due to Covid pneumonia  and volume overload due to missed dialysis Recent EF on TEE normal  No chest pain  Continue statins, aspirin, beta-blockers   #) Acute on chronic hypoosmolar hyponatremia: Relative to baseline serum sodium range of 131 132, with most recent prior value 131 on 08/27/2020, presenting serum sodium of 128.This is potentially multifactorial in nature, with potential contributing factors including an element of SIADH in the context of acute pulmonary infection stemming from severe COVID-19 pneumonia, missing dialysis.  Sodium levels improved with dialysis. Sodium is 132 today  We will need to check labs as outpatient by PCP or nephrology    #) Anion gap metabolic acidosis:in the setting of missed hemodialysis session and acute hypoxic respiratory failure Improved    #) Hyperlipidemia: On atorvastatin     #) Anemia of chronic kidney disease: Associated with a baseline hemoglobin range of 8-11, stable  Discharge Diagnoses:  Principal Problem:   Pneumonia due to COVID-19 virus Active Problems:   End stage renal disease ([place])   ESRD on dialysis ([place])   High anion gap metabolic acidosis   Elevated troponin   SOB (shortness of breath)    Discharge Instructions  Discharge Instructions    Call MD for:  difficulty breathing, headache or visual disturbances   Complete by: As directed    Call MD for:  temperature >100.4   Complete by: As directed    Diet - low sodium heart healthy  Complete by: As directed    Discharge instructions   Complete by: As directed    Follow up with dialysis at [facility]  12:45pm   Increase activity slowly   Complete by: As directed    No wound care   Complete by: As directed      Allergies as of 09/12/2020      Reactions   Baclofen Nausea And Vomiting   Gabapentin Nausea And Vomiting      Medication List    TAKE these medications   Accu-Chek FastClix Lancets Misc USE TO CHECK BLOOD SUGAR UP TO 4 TIMES DAILY AS DIRECTED   albuterol 108 (90 Base) MCG/ACT inhaler Commonly known as: VENTOLIN HFA Inhale 1-2 puffs into the lungs every 4 (four) hours as needed for wheezing or shortness of breath.   amLODipine 10 MG tablet Commonly known as: NORVASC Take 1 tablet (10 mg total) by mouth daily.   aspirin 81 MG EC tablet Take 1 tablet (81 mg total) by mouth daily.   blood glucose meter kit and supplies Kit Dispense based on patient and insurance preference. Use up to four times daily as directed. (FOR ICD-9 250.00, 250.01).   chlorpheniramine-HYDROcodone 10-8 MG/5ML Suer Commonly known as: TUSSIONEX Take 5 mLs by mouth every 12 (twelve) hours as needed for up to 2 days for cough.   famotidine 20 MG tablet Commonly known as: PEPCID Take 1 tablet (20 mg total) by mouth daily.   hydrALAZINE 25 MG tablet Commonly known as: APRESOLINE Take 1 tablet (25 mg total) by mouth every 8 (eight) hours.   HYDROcodone-acetaminophen 5-325 MG tablet Commonly known as: Norco Take 1 tablet by mouth every 6 (six) hours as needed for severe pain.   lisinopril 20 MG tablet Commonly known as: ZESTRIL Take 20 mg by mouth daily.   metoprolol tartrate 25 MG tablet Commonly known as: LOPRESSOR Take 50 mg by mouth 2 (two) times daily.   predniSONE 20 MG tablet Commonly known as: DELTASONE Take 1 tablet (20 mg total) by mouth daily for 6 doses.   vancomycin  IVPB Inject 1,000 mg into the vein every dialysis for up to 12 doses. Indication:  MRSA Bacteremia First Dose: Yes Last Day of Therapy:  09/25/2020 Labs - Sunday/Monday:  CBC/D, BMP, and vancomycin  trough. Labs - Thursday:  BMP and vancomycin trough Labs - Every other week:  ESR and CRP Method of administration:Elastomeric Method of administration may be changed at the discretion of the patient and/or caregiver's ability to self-administer the medication ordered.       Follow-up Information    Revelo, Elyse Jarvis, MD Follow up in 1 week(s).   Specialty: Family Medicine Contact information: [address] [place] [address] [phone]              Allergies  Allergen Reactions  . Baclofen Nausea And Vomiting  . Gabapentin Nausea And Vomiting    Consultations:  Nephrology   Procedures/Studies: DG Chest 2 View  Result Date: 09/09/2020 CLINICAL DATA:  Shortness of breath EXAM: CHEST - 2 VIEW COMPARISON:  08/25/2020 FINDINGS: Heart size appears mildly enlarged, unchanged. Low lung volumes. Pulmonary vascular congestion with diffuse interstitial prominence. Patchy bibasilar airspace opacities, worsened from prior. Suspect small bilateral pleural effusions. No pneumothorax. IMPRESSION: 1. Findings suggestive of CHF with pulmonary edema. 2. Patchy bibasilar airspace opacities, which may reflect alveolar edema versus pneumonia. Electronically Signed   By: Davina Poke D.O.   On: 09/09/2020 16:05  MRI Right foot without contrast  Result Date: 08/25/2020 CLINICAL DATA:  Xray Erosive changes in the second distal phalanx consistent with osteomyelitis EXAM: MRI OF THE RIGHT FOREFOOT WITHOUT CONTRAST TECHNIQUE: Multiplanar, multisequence MR imaging of the right was performed. No intravenous contrast was administered. COMPARISON:  X-ray right foot 08/25/2020. FINDINGS: Bones/Joint/Cartilage Destruction of the majority of the second digit distal phalanx with associated hypointensity of the base of the second digit distal phalanx, as well as of the second digit middle phalanx, and likely the head of the second digit proximal phalanx on on T1 sequence with associated  hyperintensity on STIR imaging. Destruction of the majority of the fifth digit middle and distal phalanges with associated hypointensity of the head/neck region of the proximal phalanx and of the remaining middle and distal phalanges on T1 sequence. These bones are not well visualized on the T2 and STIR sequences. Degenerative changes of the first metatarsal interphalangeal joint. Ligaments Unremarkable. Muscles and Tendons No associated intramuscular abscess formation. Soft tissues Overlying soft tissue defect of the distal second digit. Dermal thickening of the second digit. Associated subcutaneus tissue the dorsal foot. IMPRESSION: 1. Osteomyelitis of the second digit proximal, middle, and likely distal phalanges. 2. Possible osteomyelitis of the fifth digit proximal, middle, and distal phalanges. Electronically Signed   By: Iven Finn M.D.   On: 08/25/2020 22:30   US ARTERIAL ABI (SCREENING LOWER EXTREMITY)  Result Date: 08/28/2020 CLINICAL DATA:  60 year old male status post right second toe amputation. EXAM: NONINVASIVE PHYSIOLOGIC VASCULAR STUDY OF BILATERAL LOWER EXTREMITIES TECHNIQUE: Evaluation of both lower extremities were performed at rest, including calculation of ankle-brachial indices with single level Doppler, pressure and pulse volume recording. COMPARISON:  None. FINDINGS: Right ABI:  Unable to calculate. Left ABI:  Unable to calculate. Right Lower Extremity:  Normal arterial waveforms at the ankle. Left Lower Extremity:  Normal arterial waveforms at the ankle. > 1.4 Non diagnostic secondary to incompressible vessel calcifications (medial arterial sclerosis of [place]) IMPRESSION: Unable to calculate ankle-brachial indices due to noncompressible vessels the level of the ankle. Ruthann Cancer, MD [facility] Electronically Signed   By: Ruthann Cancer MD   On: 08/28/2020 11:44   DG Chest Portable 1 View  Result Date:  08/25/2020 CLINICAL DATA:  Cough EXAM: PORTABLE CHEST 1 VIEW COMPARISON:  03/29/2019 FINDINGS: Cardiac shadow is at the upper limits of normal in size. Mild vascular congestion is noted without interstitial edema. No focal infiltrate or sizable effusion is seen. No bony abnormality is noted. IMPRESSION: Mild vascular congestion without edema or focal infiltrate. Electronically Signed   By: Inez Catalina M.D.   On: 08/25/2020 12:36   DG Foot Complete Right  Result Date: 08/25/2020 CLINICAL DATA:  Second toe wound with pain and history of gout, initial encounter EXAM: RIGHT FOOT COMPLETE - 3+ VIEW COMPARISON:  None. FINDINGS: Soft tissue wound is noted in the distal aspect of the second toe. Resorption of the second distal phalanx is noted consistent with osteomyelitis. Resorptive changes in the fifth digit are noted likely of a chronic nature given its appearance. Diffuse vascular calcifications are seen. Mild soft tissue swelling is noted. No fracture or dislocation is seen. IMPRESSION: Erosive changes in the second distal phalanx consistent with osteomyelitis. Electronically Signed   By: Inez Catalina M.D.   On: 08/25/2020 12:39   ECHO TEE  Result Date: 08/28/2020    TRANSESOPHOGEAL ECHO REPORT   Patient Name:   Steve Vance Date of Exam: 08/28/2020  Medical Rec #:  [id number]     Height:       71.0 in Accession #:    [id number]    Weight:       254.0 lb Date of Birth:  [birth year]    BSA:          2.334 m Patient Age:    24 years      BP:           153/76 mmHg Patient Gender: M             HR:           89 bpm. Exam Location:  [facility] Procedure: 2D Echo, Color Doppler and Cardiac Doppler Indications:     Bacteremia 790.7  History:         Patient has prior history of Echocardiogram examinations, most                  recent 03/29/2019. Risk Factors:Hypertension and Diabetes.  Sonographer:     Sherrie Sport RDCS (AE) Referring Phys:  [id number] Arvil Chaco Diagnosing Phys: Kathlyn Sacramento MD PROCEDURE: The  transesophogeal probe was passed without difficulty through the esophogus of the patient. Local oropharyngeal anesthetic was provided with Benzocaine spray. Sedation performed by different physician. Image quality was good. The patient's vital signs; including heart rate, blood pressure, and oxygen saturation; remained stable throughout the procedure. The patient developed no complications during the procedure. IMPRESSIONS  1. Left ventricular ejection fraction, by estimation, is 55 to 60%. The left ventricle has normal function. The left ventricle has no regional wall motion abnormalities. There is mild left ventricular hypertrophy. Left ventricular diastolic function could not be evaluated.  2. Right ventricular systolic function is normal. The right ventricular size is normal.  3. No left atrial/left atrial appendage thrombus was detected.  4. The mitral valve is normal in structure. Trivial mitral valve regurgitation. No evidence of mitral stenosis.  5. The aortic valve is normal in structure. Aortic valve regurgitation is not visualized. No aortic stenosis is present.  6. Agitated saline contrast bubble study was negative, with no evidence of any interatrial shunt. Conclusion(s)/Recommendation(s): No evidence of vegetation/infective endocarditis on this transesophageal echocardiogram. FINDINGS  Left Ventricle: Left ventricular ejection fraction, by estimation, is 55 to 60%. The left ventricle has normal function. The left ventricle has no regional wall motion abnormalities. The left ventricular internal cavity size was normal in size. There is  mild left ventricular hypertrophy. Left ventricular diastolic function could not be evaluated. Right Ventricle: The right ventricular size is normal. No increase in right ventricular wall thickness. Right ventricular systolic function is normal. Left Atrium: Left atrial size was normal in size. No left atrial/left atrial appendage thrombus was detected. Right Atrium:  Right atrial size was normal in size. Pericardium: There is no evidence of pericardial effusion. Mitral Valve: The mitral valve is normal in structure. Trivial mitral valve regurgitation. No evidence of mitral valve stenosis. Tricuspid Valve: The tricuspid valve is normal in structure. Tricuspid valve regurgitation is trivial. No evidence of tricuspid stenosis. Aortic Valve: The aortic valve is normal in structure. Aortic valve regurgitation is not visualized. No aortic stenosis is present. Pulmonic Valve: The pulmonic valve was normal in structure. Pulmonic valve regurgitation is not visualized. No evidence of pulmonic stenosis. Aorta: The aortic root is normal in size and structure. IAS/Shunts: No atrial level shunt detected by color flow Doppler. Agitated saline contrast bubble study was negative, with no evidence of any interatrial shunt.  Kathlyn Sacramento MD Electronically signed by Kathlyn Sacramento MD Signature Date/Time: 08/28/2020/10:50:45 AM    Final       Subjective: Feels shortness of breath is better. No chest pain  Discharge Exam: Vitals:   09/12/20 1118 09/12/20 1341  BP: (!) 187/95   Pulse: 86   Resp: 19   Temp: 98.4 F (36.9 C)   SpO2: 100% 100%   Vitals:   09/12/20 0935 09/12/20 1042 09/12/20 1118 09/12/20 1341  BP: (!) 186/92 (!) 171/96 (!) 187/95   Pulse: 87 82 86   Resp:   19   Temp:   98.4 F (36.9 C)   TempSrc:      SpO2:   100% 100%  Weight:      Height:        General: Pt is alert, awake, not in acute distress Cardiovascular: RRR, S1/S2 +, no rubs, no gallops Respiratory: CTA bilaterally, no wheezing, no rhonchi Abdominal: Soft, NT, ND, bowel sounds + Extremities: Chronic skin changes    The results of significant diagnostics from this hospitalization (including imaging, microbiology, ancillary and laboratory) are listed below for reference.     Microbiology: No results found for this or any previous visit (from the past 240 hour(s)).   Labs: BNP (last  3 results) Recent Labs    09/09/20 1516  BNP 150.5*   Basic Metabolic Panel: Recent Labs  Lab 09/09/20 1516 09/10/20 0722 09/11/20 1030 09/12/20 0309  NA 128* 128* 125* 132*  K 4.7 5.2* 5.1 4.7  CL 93* 91* 90* 94*  CO2 19* 19* 14* 25  GLUCOSE 115* 181* 96 193*  BUN 68* 85* 100* 74*  CREATININE 14.29* 15.20* 15.63* 11.52*  CALCIUM 8.9 8.9 8.6* 8.8*  MG  --  2.1  --   --   PHOS  --  8.9*  --   --    Liver Function Tests: Recent Labs  Lab 09/10/20 0722 09/11/20 1030  AST 38 41  ALT 15 16  ALKPHOS 40 35*  BILITOT 1.3* 1.6*  PROT 8.6* 7.9  ALBUMIN 2.9* 2.8*   No results for input(s): LIPASE, AMYLASE in the last 168 hours. No results for input(s): AMMONIA in the last 168 hours. CBC: Recent Labs  Lab 09/09/20 1516 09/10/20 0722 09/10/20 1741  WBC 5.7 4.2 7.9  NEUTROABS  --  3.5  --   HGB 8.3* 9.0* 10.0*  HCT 25.5* 27.3* 30.5*  MCV 86.1 84.0 85.0  PLT 184 219 234   Cardiac Enzymes: No results for input(s): CKTOTAL, CKMB, CKMBINDEX, TROPONINI in the last 168 hours. BNP: Invalid input(s): POCBNP CBG: Recent Labs  Lab 09/10/20 2249 09/11/20 0803 09/11/20 2003 09/12/20 0743 09/12/20 1146  GLUCAP 113* 95 130* 125* 134*   D-Dimer Recent Labs    09/09/20 1704 09/10/20 0722  DDIMER 3.68* 3.68*   Hgb A1c No results for input(s): HGBA1C in the last 72 hours. Lipid Profile Recent Labs    09/09/20 1516  TRIG 131   Thyroid function studies No results for input(s): TSH, T4TOTAL, T3FREE, THYROIDAB in the last 72 hours.  Invalid input(s): FREET3 Anemia work up Recent Labs    09/10/20 0722 09/11/20 1908  FERRITIN 662* 806*   Urinalysis    Component Value Date/Time   COLORURINE AMBER (A) 08/25/2020 1335   APPEARANCEUR HAZY (A) 08/25/2020 1335   APPEARANCEUR Cloudy (A) 07/30/2019 1301   LABSPEC 1.009 08/25/2020 1335   PHURINE 8.0 08/25/2020 1335   GLUCOSEU 50 (A) 08/25/2020 1335   HGBUR  LARGE (A) 08/25/2020 1335   BILIRUBINUR NEGATIVE  08/25/2020 1335   BILIRUBINUR Negative 07/30/2019 1301   KETONESUR NEGATIVE 08/25/2020 1335   PROTEINUR >=300 (A) 08/25/2020 1335   NITRITE NEGATIVE 08/25/2020 1335   LEUKOCYTESUR TRACE (A) 08/25/2020 1335   Sepsis Labs Invalid input(s): PROCALCITONIN,  WBC,  LACTICIDVEN Microbiology No results found for this or any previous visit (from the past 240 hour(s)).   Time coordinating discharge: Over 30 minutes  SIGNED:   Nolberto Hanlon, MD  [facility] 09/12/2020, 1:56 PM Pager   If 7PM-7AM, please contact night-coverage [email or web address] Password [email or web address]

## 2020-09-12 NOTE — Progress Notes (Signed)
SATURATION QUALIFICATIONS: (This note is used to comply with regulatory documentation for home oxygen)  Patient Saturations on Room Air at Rest = 100%  Patient Saturations on Room Air while Ambulating = 96%

## 2020-09-18 ENCOUNTER — Inpatient Hospital Stay: Payer: Medicare Other | Admitting: Infectious Diseases

## 2020-09-23 ENCOUNTER — Other Ambulatory Visit: Payer: Self-pay

## 2020-09-23 ENCOUNTER — Inpatient Hospital Stay
Admission: EM | Admit: 2020-09-23 | Discharge: 2020-09-26 | DRG: 377 | Disposition: A | Discharge status: Home / Self Care | Payer: Medicare HMO | Attending: Family Medicine | Admitting: Family Medicine

## 2020-09-23 ENCOUNTER — Emergency Department: Payer: Medicare HMO

## 2020-09-23 DIAGNOSIS — D649 Anemia, unspecified: Secondary | ICD-10-CM

## 2020-09-23 DIAGNOSIS — K2270 Barrett's esophagus without dysplasia: Secondary | ICD-10-CM | POA: Diagnosis present

## 2020-09-23 DIAGNOSIS — K317 Polyp of stomach and duodenum: Secondary | ICD-10-CM | POA: Diagnosis present

## 2020-09-23 DIAGNOSIS — E1169 Type 2 diabetes mellitus with other specified complication: Secondary | ICD-10-CM | POA: Diagnosis present

## 2020-09-23 DIAGNOSIS — D631 Anemia in chronic kidney disease: Secondary | ICD-10-CM | POA: Diagnosis present

## 2020-09-23 DIAGNOSIS — R7881 Bacteremia: Secondary | ICD-10-CM | POA: Diagnosis present

## 2020-09-23 DIAGNOSIS — Z7982 Long term (current) use of aspirin: Secondary | ICD-10-CM

## 2020-09-23 DIAGNOSIS — E785 Hyperlipidemia, unspecified: Secondary | ICD-10-CM | POA: Diagnosis present

## 2020-09-23 DIAGNOSIS — K921 Melena: Secondary | ICD-10-CM | POA: Diagnosis not present

## 2020-09-23 DIAGNOSIS — K2971 Gastritis, unspecified, with bleeding: Secondary | ICD-10-CM | POA: Diagnosis present

## 2020-09-23 DIAGNOSIS — D62 Acute posthemorrhagic anemia: Secondary | ICD-10-CM | POA: Diagnosis present

## 2020-09-23 DIAGNOSIS — Z992 Dependence on renal dialysis: Secondary | ICD-10-CM

## 2020-09-23 DIAGNOSIS — Z9114 Patient's other noncompliance with medication regimen: Secondary | ICD-10-CM

## 2020-09-23 DIAGNOSIS — I10 Essential (primary) hypertension: Secondary | ICD-10-CM | POA: Diagnosis present

## 2020-09-23 DIAGNOSIS — Z8616 Personal history of COVID-19: Secondary | ICD-10-CM

## 2020-09-23 DIAGNOSIS — N186 End stage renal disease: Secondary | ICD-10-CM | POA: Diagnosis present

## 2020-09-23 DIAGNOSIS — K219 Gastro-esophageal reflux disease without esophagitis: Secondary | ICD-10-CM | POA: Diagnosis present

## 2020-09-23 DIAGNOSIS — Z79899 Other long term (current) drug therapy: Secondary | ICD-10-CM

## 2020-09-23 DIAGNOSIS — E1122 Type 2 diabetes mellitus with diabetic chronic kidney disease: Secondary | ICD-10-CM | POA: Diagnosis present

## 2020-09-23 DIAGNOSIS — E8770 Fluid overload, unspecified: Secondary | ICD-10-CM | POA: Diagnosis present

## 2020-09-23 DIAGNOSIS — I132 Hypertensive heart and chronic kidney disease with heart failure and with stage 5 chronic kidney disease, or end stage renal disease: Secondary | ICD-10-CM | POA: Diagnosis present

## 2020-09-23 DIAGNOSIS — K2981 Duodenitis with bleeding: Secondary | ICD-10-CM | POA: Diagnosis not present

## 2020-09-23 DIAGNOSIS — B9562 Methicillin resistant Staphylococcus aureus infection as the cause of diseases classified elsewhere: Secondary | ICD-10-CM | POA: Diagnosis present

## 2020-09-23 DIAGNOSIS — Z9115 Patient's noncompliance with renal dialysis: Secondary | ICD-10-CM

## 2020-09-23 DIAGNOSIS — R0600 Dyspnea, unspecified: Secondary | ICD-10-CM

## 2020-09-23 DIAGNOSIS — I160 Hypertensive urgency: Secondary | ICD-10-CM

## 2020-09-23 DIAGNOSIS — N2581 Secondary hyperparathyroidism of renal origin: Secondary | ICD-10-CM | POA: Diagnosis present

## 2020-09-23 DIAGNOSIS — I509 Heart failure, unspecified: Secondary | ICD-10-CM

## 2020-09-23 DIAGNOSIS — K449 Diaphragmatic hernia without obstruction or gangrene: Secondary | ICD-10-CM | POA: Diagnosis present

## 2020-09-23 DIAGNOSIS — Z89421 Acquired absence of other right toe(s): Secondary | ICD-10-CM

## 2020-09-23 LAB — BASIC METABOLIC PANEL
Anion gap: 15 (ref 5–15)
BUN: 62 mg/dL — ABNORMAL HIGH (ref 8–23)
CO2: 24 mmol/L (ref 22–32)
Calcium: 8.8 mg/dL — ABNORMAL LOW (ref 8.9–10.3)
Chloride: 94 mmol/L — ABNORMAL LOW (ref 98–111)
Creatinine, Ser: 11.52 mg/dL — ABNORMAL HIGH (ref 0.61–1.24)
GFR, Estimated: 5 mL/min — ABNORMAL LOW (ref >60)
Glucose, Bld: 97 mg/dL (ref 70–99)
Potassium: 4.5 mmol/L (ref 3.5–5.1)
Sodium: 133 mmol/L — ABNORMAL LOW (ref 135–145)

## 2020-09-23 LAB — LIPASE, BLOOD: Lipase: 31 U/L (ref 11–51)

## 2020-09-23 LAB — PROCALCITONIN: Procalcitonin: 0.34 ng/mL

## 2020-09-23 LAB — TROPONIN I (HIGH SENSITIVITY)
Troponin I (High Sensitivity): 47 ng/L — ABNORMAL HIGH (ref <18)
Troponin I (High Sensitivity): 53 ng/L — ABNORMAL HIGH (ref <18)

## 2020-09-23 LAB — HEPATIC FUNCTION PANEL
ALT: 13 U/L (ref 0–44)
AST: 20 U/L (ref 15–41)
Albumin: 3.2 g/dL — ABNORMAL LOW (ref 3.5–5.0)
Alkaline Phosphatase: 48 U/L (ref 38–126)
Bilirubin, Direct: <0.1 mg/dL (ref 0.0–0.2)
Total Bilirubin: 1.3 mg/dL — ABNORMAL HIGH (ref 0.3–1.2)
Total Protein: 7.8 g/dL (ref 6.5–8.1)

## 2020-09-23 LAB — CBC
HCT: 20.7 % — ABNORMAL LOW (ref 39.0–52.0)
Hemoglobin: 6.5 g/dL — ABNORMAL LOW (ref 13.0–17.0)
MCH: 27.3 pg (ref 26.0–34.0)
MCHC: 31.4 g/dL (ref 30.0–36.0)
MCV: 87 fL (ref 80.0–100.0)
Platelets: 207 10*3/uL (ref 150–400)
RBC: 2.38 MIL/uL — ABNORMAL LOW (ref 4.22–5.81)
RDW: 15.2 % (ref 11.5–15.5)
WBC: 9.1 10*3/uL (ref 4.0–10.5)
nRBC: 0 % (ref 0.0–0.2)

## 2020-09-23 LAB — BRAIN NATRIURETIC PEPTIDE: B Natriuretic Peptide: 638 pg/mL — ABNORMAL HIGH (ref 0.0–100.0)

## 2020-09-23 LAB — PREPARE RBC (CROSSMATCH)

## 2020-09-23 IMAGING — CR DG CHEST 2V
2 series · 2 of 2 positions shown · non-contrast
Comparison: [DATE]

CLINICAL DATA: Shortness of breath.  COVID 19 pneumonia.

EXAM:
CHEST - 2 VIEW

[chest lat]
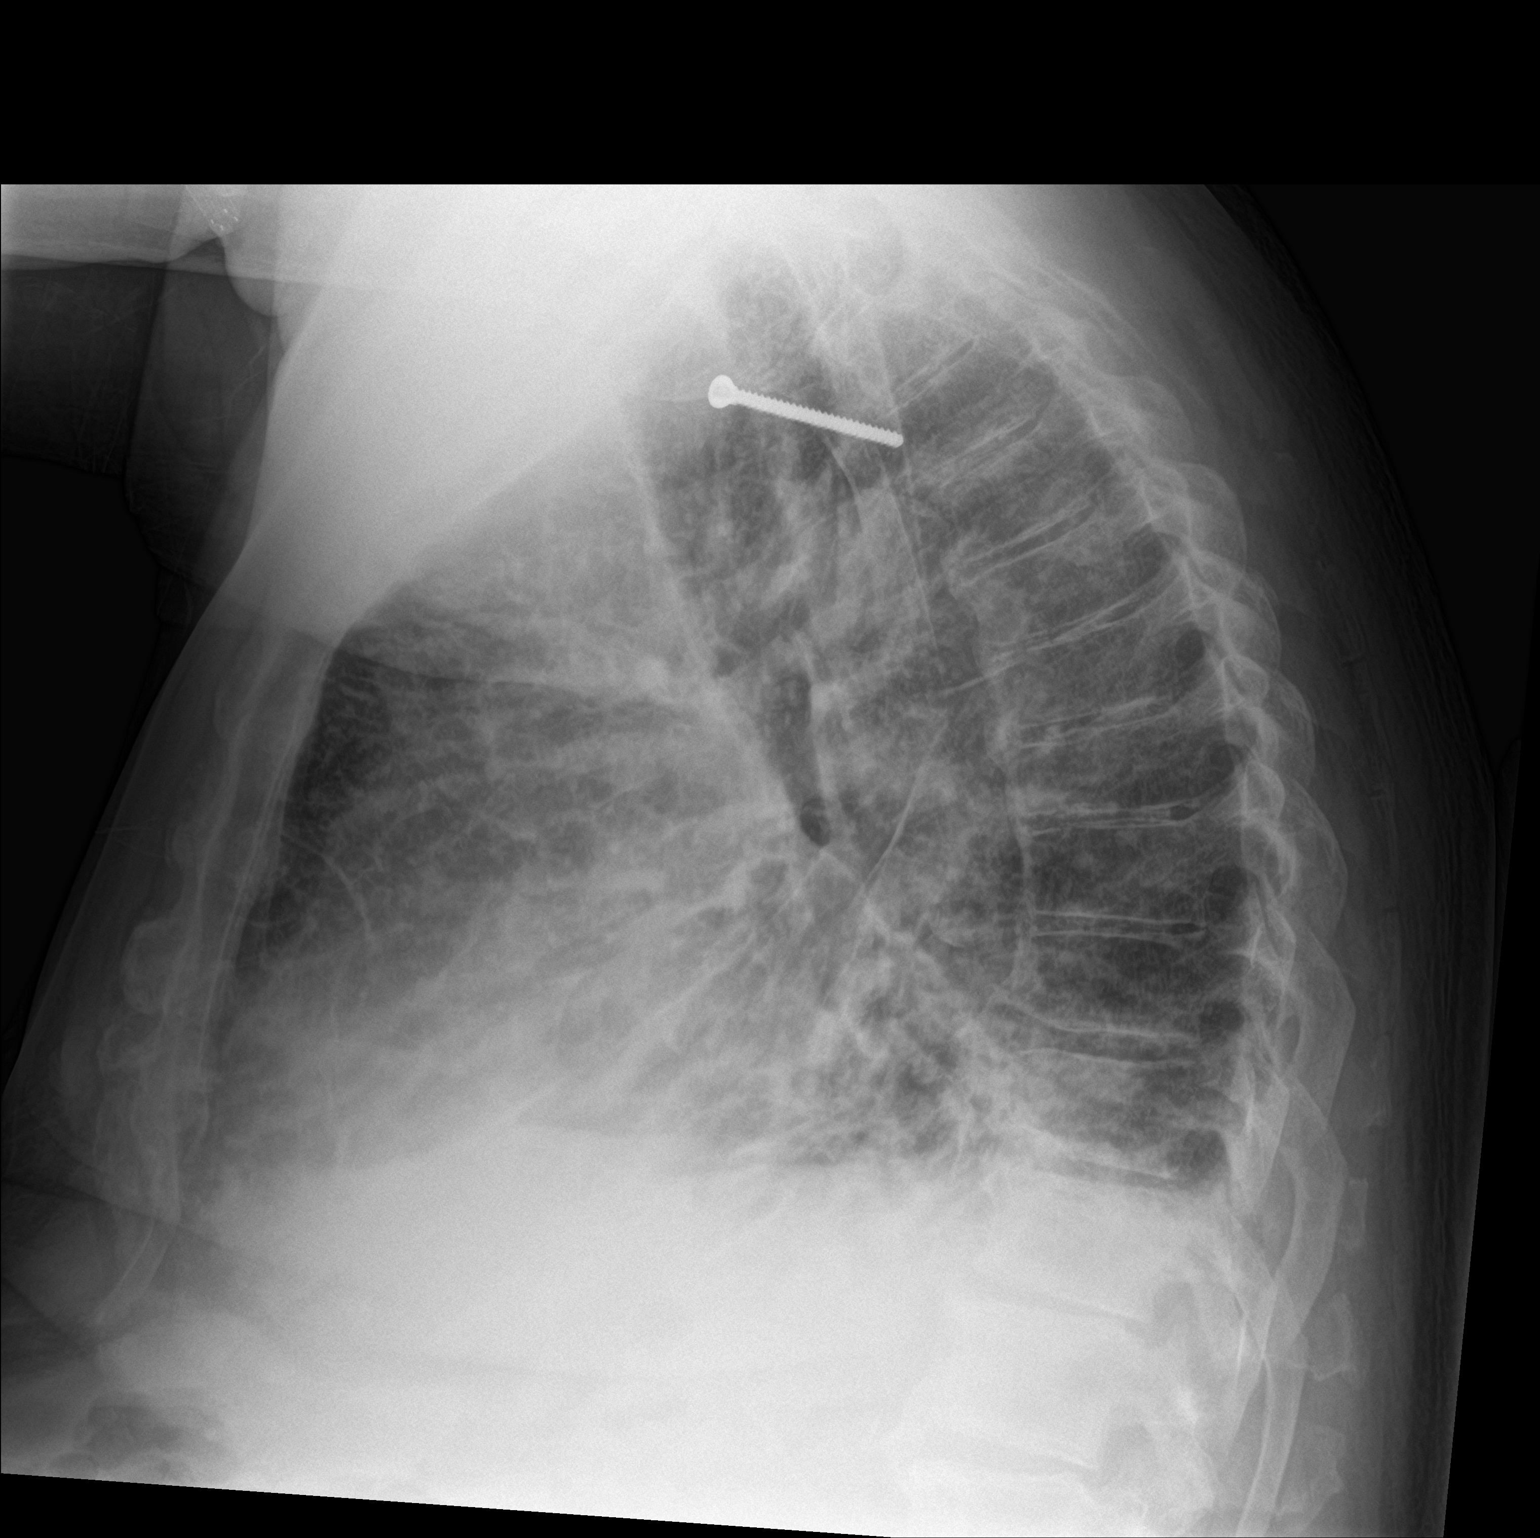

[chest ap]
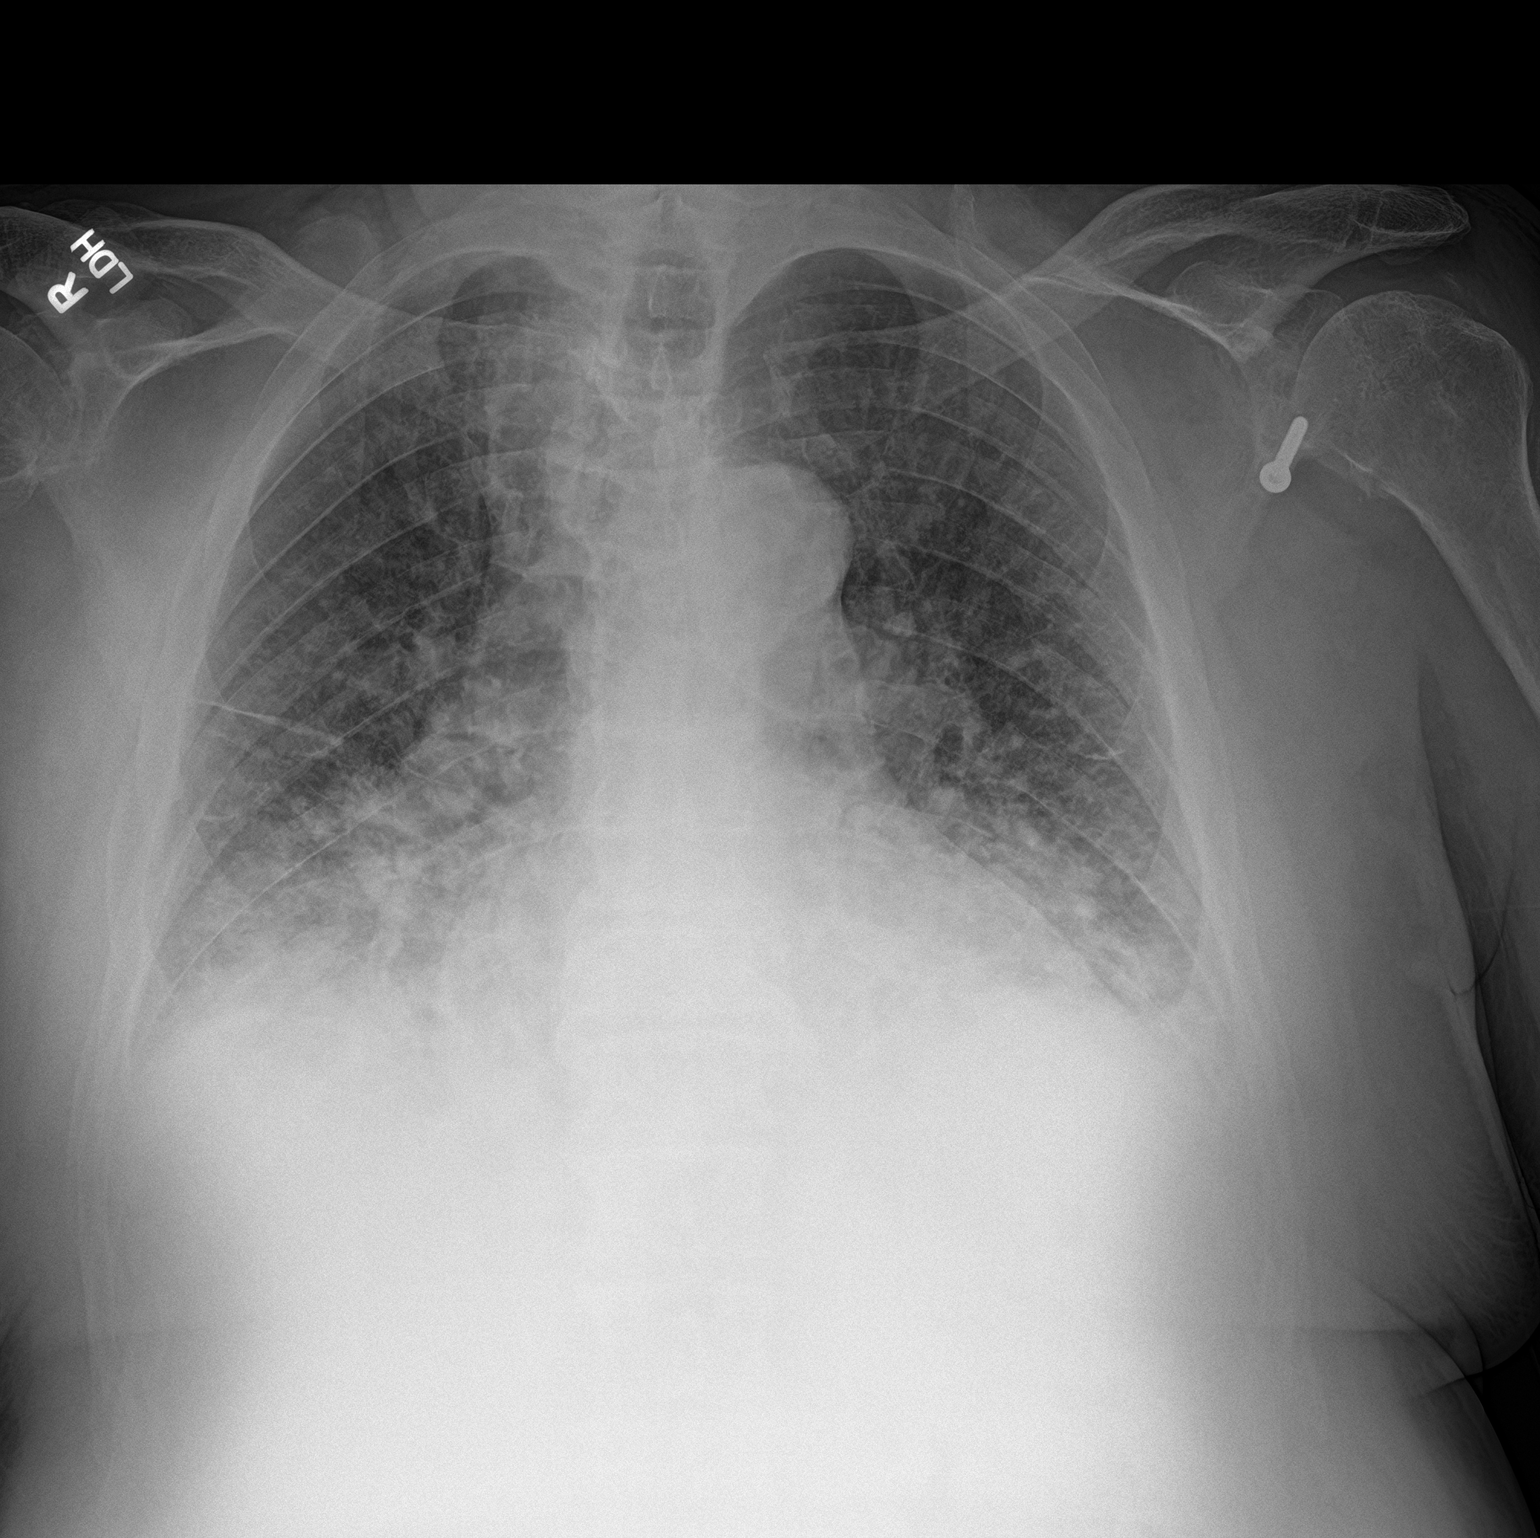

[2 of 2 positions shown; findings below may reference images not displayed]

FINDINGS: Midline trachea. Cardiomegaly, accentuated by low lung volumes on
the frontal AP radiograph. Pulmonary interstitial prominence. Right
greater than left base airspace disease. No pleural effusion or
pneumothorax. Left glenoid fixation.
IMPRESSION: Cardiomegaly and mild pulmonary interstitial prominence, suspicious
for venous congestion.

Bibasilar airspace disease is similar. Atelectasis versus multifocal
pneumonia.

## 2020-09-23 MED ORDER — FUROSEMIDE 10 MG/ML IJ SOLN
80.0000 mg | Freq: Once | INTRAMUSCULAR | Status: AC
  Administered 2020-09-23: 80 mg via INTRAVENOUS
  Filled 2020-09-23: qty 8

## 2020-09-23 MED ORDER — NITROGLYCERIN 2 % TD OINT
0.5000 [in_us] | TOPICAL_OINTMENT | Freq: Once | TRANSDERMAL | Status: AC
  Administered 2020-09-23: 0.5 [in_us] via TOPICAL
  Filled 2020-09-23: qty 1

## 2020-09-23 MED ORDER — SODIUM CHLORIDE 0.9 % IV SOLN: Freq: Once | INTRAVENOUS | Status: AC

## 2020-09-23 MED ORDER — AMLODIPINE BESYLATE 5 MG PO TABS
10.0000 mg | ORAL_TABLET | Freq: Once | ORAL | Status: AC
  Administered 2020-09-23: 10 mg via ORAL
  Filled 2020-09-23: qty 2

## 2020-09-23 MED ORDER — SODIUM CHLORIDE 0.9 % IV SOLN
10.0000 mL/h | Freq: Once | INTRAVENOUS | Status: AC
  Administered 2020-09-23: 10 mL/h via INTRAVENOUS

## 2020-09-23 MED ORDER — LISINOPRIL 10 MG PO TABS
20.0000 mg | ORAL_TABLET | Freq: Once | ORAL | Status: AC
  Administered 2020-09-23: 20 mg via ORAL
  Filled 2020-09-23: qty 2

## 2020-09-23 NOTE — ED Notes (Signed)
IV team at bedside 
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
## 2020-09-23 NOTE — ED Notes (Signed)
Unsuccessful IV start attempt x2, will place consult

## 2020-09-23 NOTE — ED Notes (Signed)
Lab called for blood work

## 2020-09-23 NOTE — ED Notes (Addendum)
This RN seeking physician to order hypertension medications. However, pt has not been assigned an admitting doctor yet and does not have admission orders. Damita Dunnings MD, admitting physician, secure chatted to assist.

## 2020-09-23 NOTE — ED Notes (Addendum)
This RN spoke with Cinda Quest MD about pt's BP and consult order need. Malinda giving verbal orders.

## 2020-09-23 NOTE — ED Notes (Signed)
Pt assisted to toilet with use of wheelchair.  Pt able to stand and take a few steps to toilet.  Pt had BM in toilet,

## 2020-09-23 NOTE — ED Notes (Signed)
Damita Dunnings MD advising she is not aware of this patient. This RN contacted bed placement and they advised that this patient was not paged for admission and follow-up should be made with an ER doctor to page for an admission.

## 2020-09-23 NOTE — ED Provider Notes (Signed)
MSE was initiated and I personally evaluated the patient and placed orders (if any) at  9:58 AM on September 23, 2020.  The patient appears stable so that the remainder of the MSE may be completed by another provider.  Decreased breathing, difficulty sleeping last few days.  + Covid the beginning of January and was admitted for pneumonia.  Patient states that he drove his sister to the hospital to see his mother and while he was here wanting to be evaluated.  Patient is unaware of any fever and denies chills.  Patient is afebrile with an O2 sat of 96%.  He is able to talk in complete sentences without any difficulty but appears to be uncomfortable.  No rales or rhonchi was noted on exam of the lungs.  Heart regular rate and rhythm without murmur.  Hypertension noted however patient did not take his blood pressure medication today.   Johnn Hai, PA-C 09/23/20 1054    Merlyn Lot, MD 09/23/20 [phone]

## 2020-09-23 NOTE — ED Triage Notes (Signed)
Pt states he was admitted with pneumonia related to covid 1/4, states he was bringing his sister to visit there mother today and was SOB, pt has a congested cough , in NAD noted at this time.

## 2020-09-24 ENCOUNTER — Inpatient Hospital Stay: Payer: Medicare HMO

## 2020-09-24 DIAGNOSIS — E1169 Type 2 diabetes mellitus with other specified complication: Secondary | ICD-10-CM | POA: Diagnosis present

## 2020-09-24 DIAGNOSIS — Z79899 Other long term (current) drug therapy: Secondary | ICD-10-CM | POA: Diagnosis not present

## 2020-09-24 DIAGNOSIS — D62 Acute posthemorrhagic anemia: Secondary | ICD-10-CM | POA: Diagnosis present

## 2020-09-24 DIAGNOSIS — K2270 Barrett's esophagus without dysplasia: Secondary | ICD-10-CM | POA: Diagnosis present

## 2020-09-24 DIAGNOSIS — N2581 Secondary hyperparathyroidism of renal origin: Secondary | ICD-10-CM | POA: Diagnosis present

## 2020-09-24 DIAGNOSIS — D631 Anemia in chronic kidney disease: Secondary | ICD-10-CM | POA: Diagnosis present

## 2020-09-24 DIAGNOSIS — K317 Polyp of stomach and duodenum: Secondary | ICD-10-CM | POA: Diagnosis present

## 2020-09-24 DIAGNOSIS — Z89421 Acquired absence of other right toe(s): Secondary | ICD-10-CM | POA: Diagnosis not present

## 2020-09-24 DIAGNOSIS — K921 Melena: Secondary | ICD-10-CM | POA: Diagnosis present

## 2020-09-24 DIAGNOSIS — Z7982 Long term (current) use of aspirin: Secondary | ICD-10-CM | POA: Diagnosis not present

## 2020-09-24 DIAGNOSIS — I160 Hypertensive urgency: Secondary | ICD-10-CM | POA: Diagnosis present

## 2020-09-24 DIAGNOSIS — Z9115 Patient's noncompliance with renal dialysis: Secondary | ICD-10-CM | POA: Diagnosis not present

## 2020-09-24 DIAGNOSIS — K2981 Duodenitis with bleeding: Secondary | ICD-10-CM | POA: Diagnosis present

## 2020-09-24 DIAGNOSIS — E8770 Fluid overload, unspecified: Secondary | ICD-10-CM

## 2020-09-24 DIAGNOSIS — B9562 Methicillin resistant Staphylococcus aureus infection as the cause of diseases classified elsewhere: Secondary | ICD-10-CM | POA: Diagnosis present

## 2020-09-24 DIAGNOSIS — R7881 Bacteremia: Secondary | ICD-10-CM | POA: Diagnosis present

## 2020-09-24 DIAGNOSIS — I132 Hypertensive heart and chronic kidney disease with heart failure and with stage 5 chronic kidney disease, or end stage renal disease: Secondary | ICD-10-CM | POA: Diagnosis present

## 2020-09-24 DIAGNOSIS — Z9114 Patient's other noncompliance with medication regimen: Secondary | ICD-10-CM | POA: Diagnosis not present

## 2020-09-24 DIAGNOSIS — E785 Hyperlipidemia, unspecified: Secondary | ICD-10-CM | POA: Diagnosis present

## 2020-09-24 DIAGNOSIS — E1122 Type 2 diabetes mellitus with diabetic chronic kidney disease: Secondary | ICD-10-CM | POA: Diagnosis present

## 2020-09-24 DIAGNOSIS — K449 Diaphragmatic hernia without obstruction or gangrene: Secondary | ICD-10-CM | POA: Diagnosis present

## 2020-09-24 DIAGNOSIS — N186 End stage renal disease: Secondary | ICD-10-CM | POA: Diagnosis present

## 2020-09-24 DIAGNOSIS — K219 Gastro-esophageal reflux disease without esophagitis: Secondary | ICD-10-CM | POA: Diagnosis present

## 2020-09-24 DIAGNOSIS — Z8616 Personal history of COVID-19: Secondary | ICD-10-CM | POA: Diagnosis not present

## 2020-09-24 DIAGNOSIS — Z992 Dependence on renal dialysis: Secondary | ICD-10-CM | POA: Diagnosis not present

## 2020-09-24 DIAGNOSIS — D649 Anemia, unspecified: Secondary | ICD-10-CM

## 2020-09-24 LAB — HEMOGLOBIN AND HEMATOCRIT, BLOOD
HCT: 22.7 % — ABNORMAL LOW (ref 39.0–52.0)
HCT: 23.5 % — ABNORMAL LOW (ref 39.0–52.0)
Hemoglobin: 7.2 g/dL — ABNORMAL LOW (ref 13.0–17.0)
Hemoglobin: 7.8 g/dL — ABNORMAL LOW (ref 13.0–17.0)

## 2020-09-24 LAB — CBG MONITORING, ED
Glucose-Capillary: 84 mg/dL (ref 70–99)
Glucose-Capillary: 95 mg/dL (ref 70–99)
Glucose-Capillary: 96 mg/dL (ref 70–99)
Glucose-Capillary: 90 mg/dL (ref 70–99)

## 2020-09-24 LAB — PHOSPHORUS: Phosphorus: 6 mg/dL — ABNORMAL HIGH (ref 2.5–4.6)

## 2020-09-24 LAB — VANCOMYCIN, RANDOM: Vancomycin Rm: 7

## 2020-09-24 IMAGING — CT CT ABD-PELV W/O CM
2 of 4 series · 15 of 46 positions shown, 17 images · non-contrast
Comparison: [DATE]

CLINICAL DATA: Abdominal pain.

EXAM:
CT ABDOMEN AND PELVIS WITHOUT CONTRAST
TECHNIQUE: Multidetector CT imaging of the abdomen and pelvis was performed
following the standard protocol without IV contrast.

[Series 2: axial st · axial · 0.98mm/px · z∈[-1011,-551]mm · 12 of 102 slices shown, 14 images]
[im 5/102  soft-tissue]
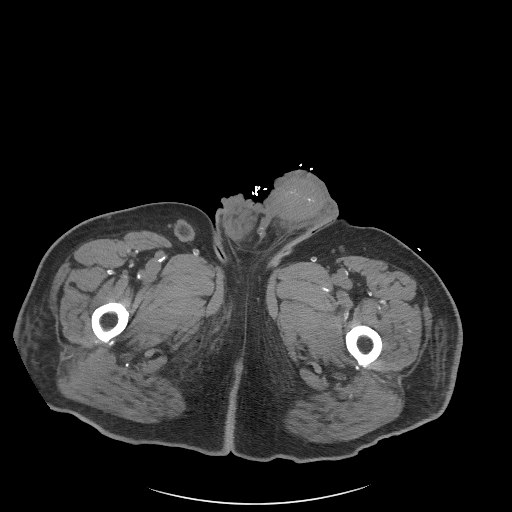
[im 5/102  bone]
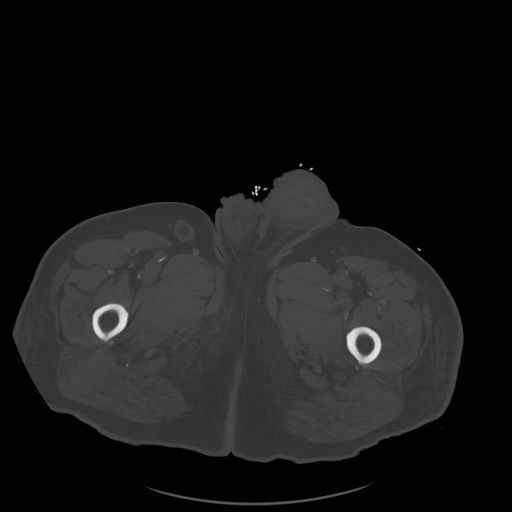
[im 14/102  soft-tissue]
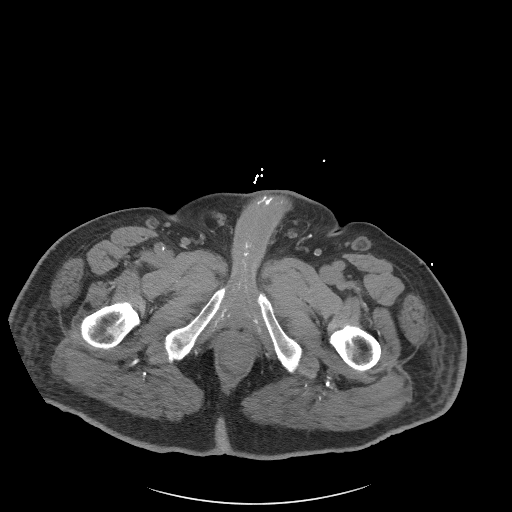
[im 23/102  soft-tissue]
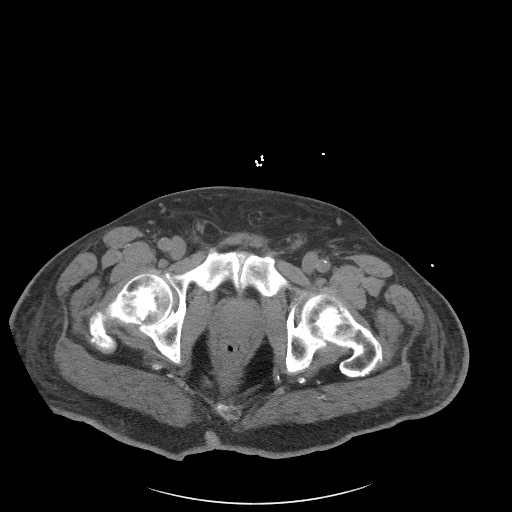
[im 33/102  soft-tissue]
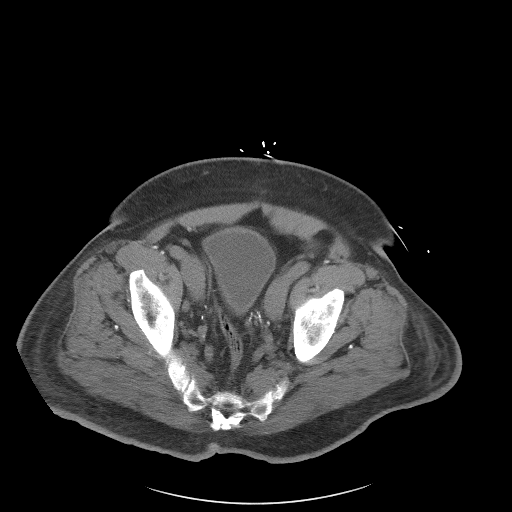
[im 37/102  soft-tissue]
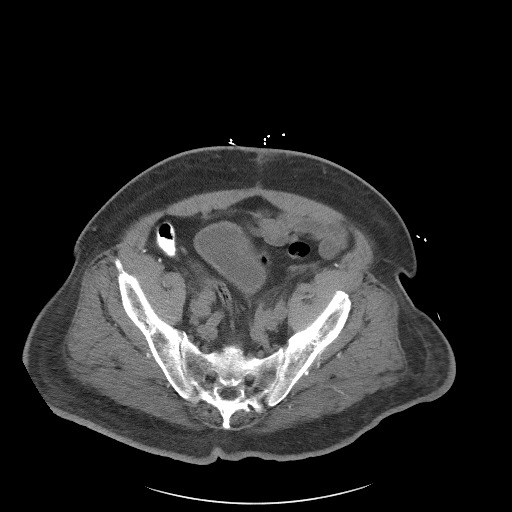
[im 46/102  soft-tissue]
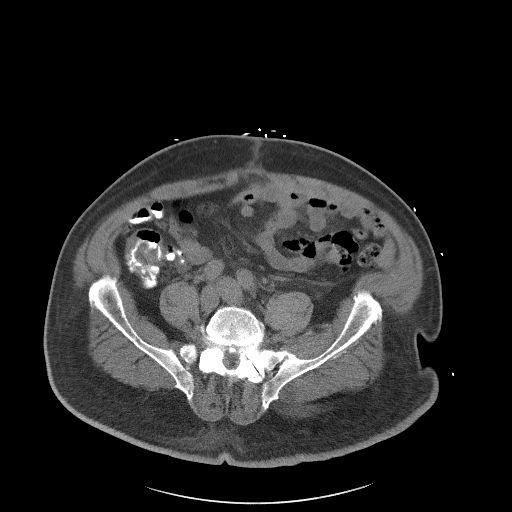
[im 56/102  soft-tissue]
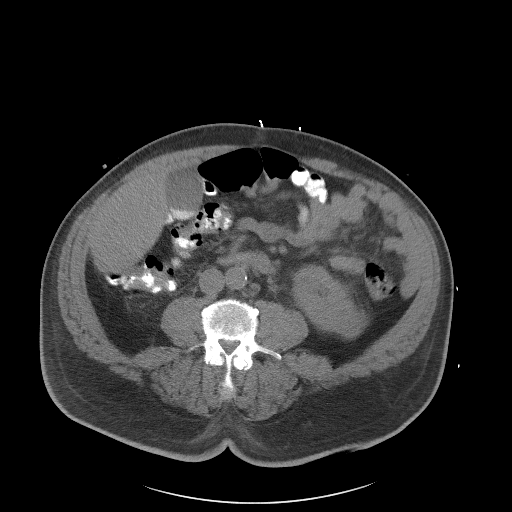
[im 65/102  soft-tissue]
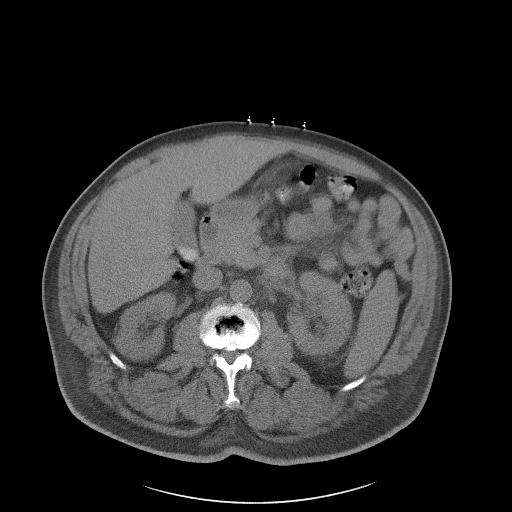
[im 69/102  soft-tissue]
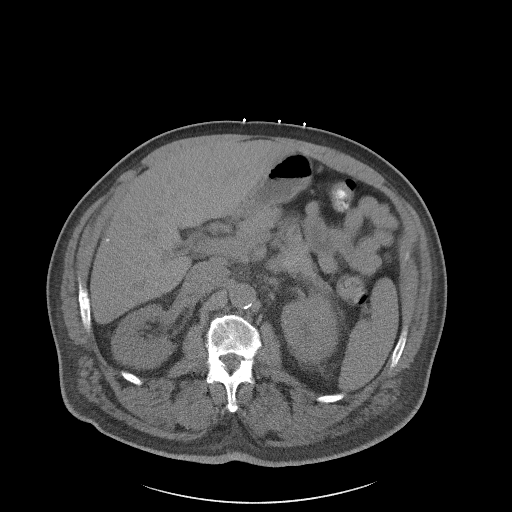
[im 69/102  bone]
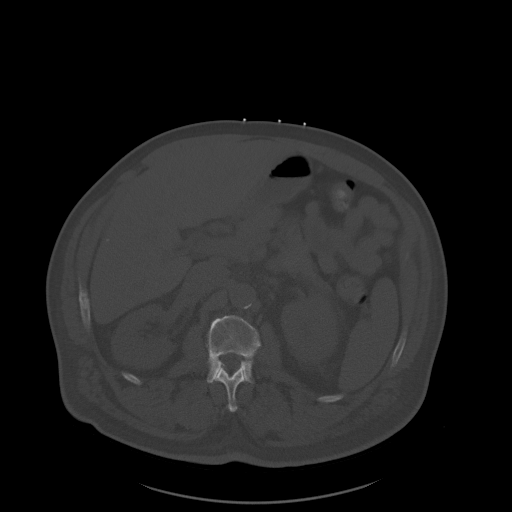
[im 79/102  soft-tissue]
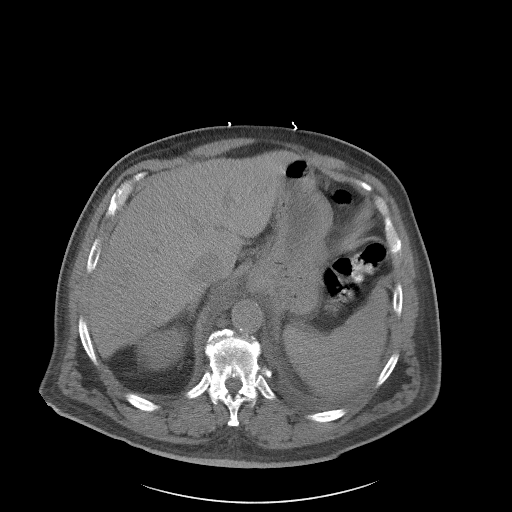
[im 88/102  soft-tissue]
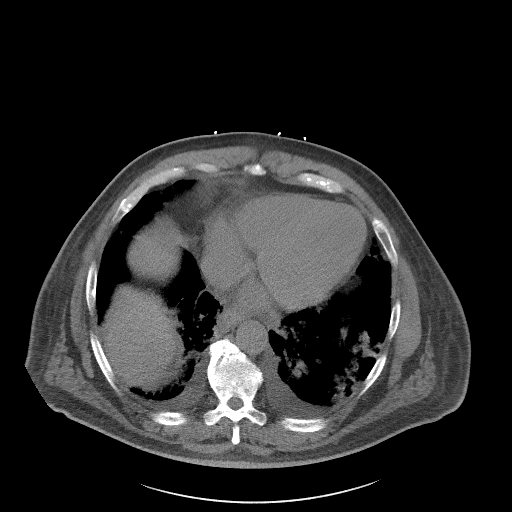
[im 97/102  soft-tissue]
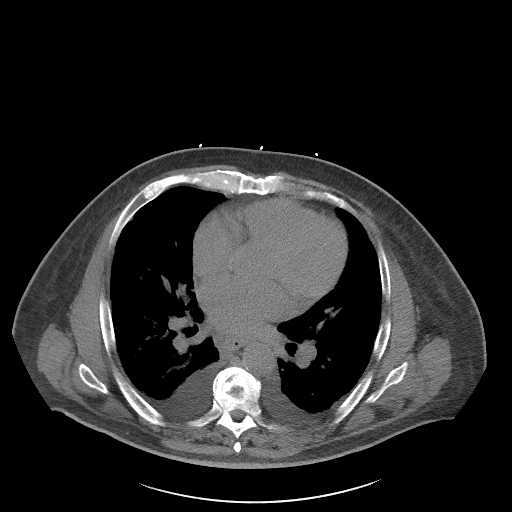

[Series 5: coronal st · coronal · 0.82mm/px · 3 of 116 slices shown]
[im 39/116  soft-tissue]
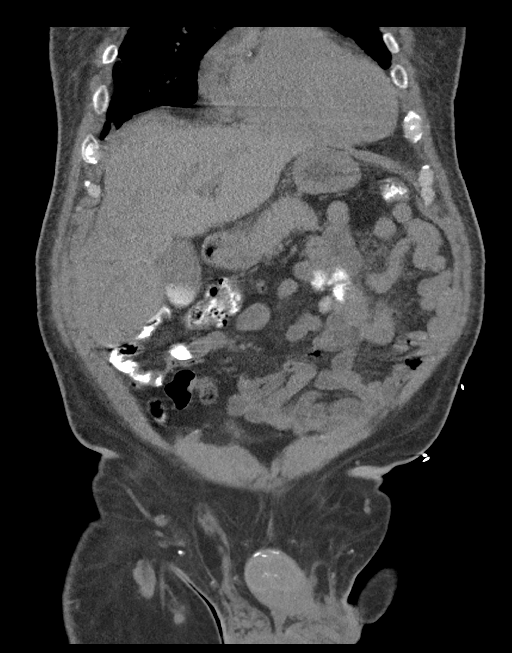
[im 52/116  soft-tissue]
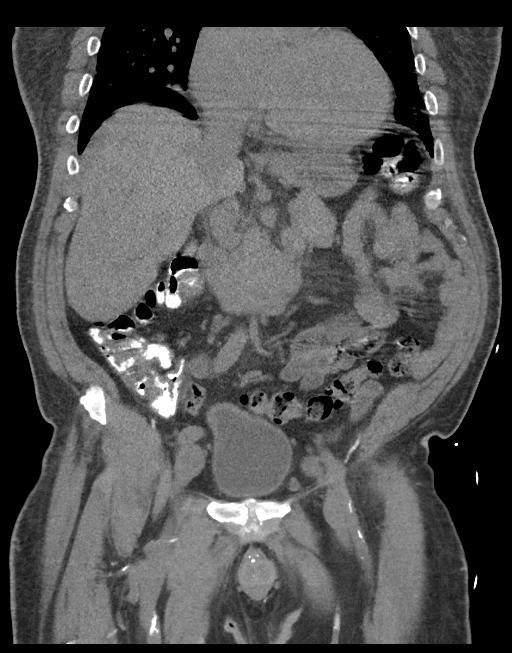
[im 64/116  soft-tissue]
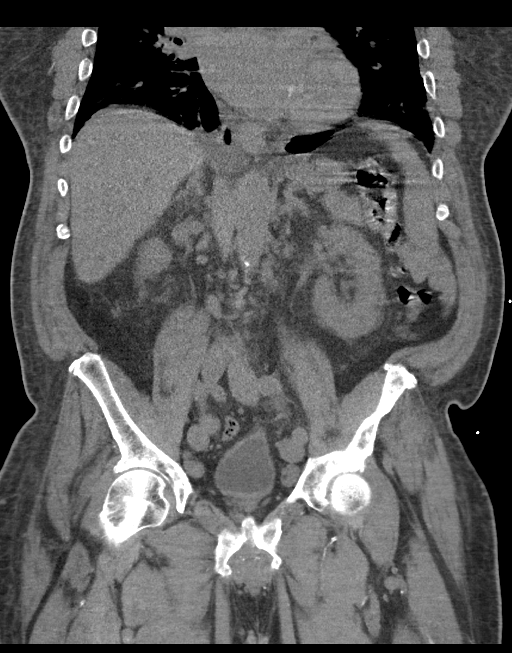

[15 of 46 positions shown; findings below may reference images not displayed]

FINDINGS: Lower chest: There are small bilateral pleural effusions.There is
cardiomegaly. The intracardiac blood pool is hypodense relative to
the adjacent myocardium consistent with anemia. There is scattered
hazy airspace opacities at the lung bases bilaterally.

Hepatobiliary: The liver is normal. There are stones versus
gallbladder sludge.There is no biliary ductal dilation.

Pancreas: Normal contours without ductal dilatation. No
peripancreatic fluid collection.

Spleen: Unremarkable.

Adrenals/Urinary Tract:

--Adrenal glands: Unremarkable.

--Right kidney/ureter: No hydronephrosis or radiopaque kidney
stones.

--Left kidney/ureter: There is haziness and fat stranding about the
left kidney which appears to be new since prior study. There is no
hydronephrosis. There is an ill-defined masslike exophytic area
arising from the left kidney measuring approximately 3 cm and 20
Hounsfield units. This likely represents a cyst seen on the
patient's recent ultrasound.

--Urinary bladder: Unremarkable.

Stomach/Bowel:

--Stomach/Duodenum: No hiatal hernia or other gastric abnormality.
Normal duodenal course and caliber.

--Small bowel: Unremarkable.

--Colon: Unremarkable.

--Appendix: Normal.

Vascular/Lymphatic: Atherosclerotic calcification is present within
the non-aneurysmal abdominal aorta, without hemodynamically
significant stenosis.

--there is mild retroperitoneal adenopathy.

--No mesenteric lymphadenopathy.

--there is pelvic adenopathy with symmetric pelvic sidewall lymph
nodes measuring up to approximately 1.5 cm.

Reproductive: Unremarkable

Other: There is a trace amount of free fluid in the abdomen. No free
air. There are bilateral fat containing inguinal hernias.

Musculoskeletal. No acute displaced fractures.
IMPRESSION: 1. Diffuse hazy bilateral airspace opacities are noted at the lung
bases with new bilateral pleural effusions and interlobular septal
thickening. Findings may be secondary to developing pulmonary edema
versus an atypical infectious process.
2. New fat stranding about the left kidney is nonspecific and may be
secondary to pyelonephritis. Correlation with laboratory studies is
recommended.
3. Mild retroperitoneal and pelvic adenopathy of unknown clinical
significance.
4. Cardiomegaly.
5. Cholelithiasis without secondary signs of acute cholecystitis.
6. Trace amount of free fluid in the abdomen.

Aortic Atherosclerosis ([9J]-[9J]).

## 2020-09-24 MED ORDER — PENTAFLUOROPROP-TETRAFLUOROETH EX AERO
1.0000 "application " | INHALATION_SPRAY | CUTANEOUS | Status: DC | PRN
  Filled 2020-09-24: qty 30

## 2020-09-24 MED ORDER — VANCOMYCIN HCL 2000 MG/400ML IV SOLN
2000.0000 mg | Freq: Once | INTRAVENOUS | Status: AC
  Administered 2020-09-24: 2000 mg via INTRAVENOUS
  Filled 2020-09-24: qty 400

## 2020-09-24 MED ORDER — HEPARIN SODIUM (PORCINE) 1000 UNIT/ML DIALYSIS
1000.0000 [IU] | INTRAMUSCULAR | Status: DC | PRN
  Filled 2020-09-24: qty 1

## 2020-09-24 MED ORDER — CHLORHEXIDINE GLUCONATE CLOTH 2 % EX PADS
6.0000 | MEDICATED_PAD | Freq: Every day | CUTANEOUS | Status: DC
  Administered 2020-09-25: 6 via TOPICAL
  Filled 2020-09-24 (×2): qty 6

## 2020-09-24 MED ORDER — ACETAMINOPHEN 325 MG PO TABS: 650.0000 mg | ORAL_TABLET | Freq: Four times a day (QID) | ORAL | Status: DC | PRN

## 2020-09-24 MED ORDER — IOHEXOL 9 MG/ML PO SOLN
500.0000 mL | ORAL | Status: AC
Start: 2020-09-24 — End: 2020-09-24
  Administered 2020-09-24: 500 mL via ORAL
  Filled 2020-09-24 (×2): qty 500

## 2020-09-24 MED ORDER — SODIUM CHLORIDE 0.9 % IV SOLN: 100.0000 mL | INTRAVENOUS | Status: DC | PRN

## 2020-09-24 MED ORDER — LIDOCAINE HCL (PF) 1 % IJ SOLN
5.0000 mL | INTRAMUSCULAR | Status: DC | PRN
  Filled 2020-09-24: qty 5

## 2020-09-24 MED ORDER — ACETAMINOPHEN 650 MG RE SUPP: 650.0000 mg | Freq: Four times a day (QID) | RECTAL | Status: DC | PRN

## 2020-09-24 MED ORDER — AMLODIPINE BESYLATE 10 MG PO TABS
10.0000 mg | ORAL_TABLET | Freq: Every day | ORAL | Status: DC
  Administered 2020-09-25 – 2020-09-26 (×2): 10 mg via ORAL
  Filled 2020-09-24 (×2): qty 1
  Filled 2020-09-24: qty 2

## 2020-09-24 MED ORDER — LABETALOL HCL 5 MG/ML IV SOLN
INTRAVENOUS | Status: AC
  Administered 2020-09-24: 20 mg via INTRAVENOUS
  Filled 2020-09-24: qty 4

## 2020-09-24 MED ORDER — ALTEPLASE 2 MG IJ SOLR
2.0000 mg | Freq: Once | INTRAMUSCULAR | Status: DC | PRN
  Filled 2020-09-24: qty 2

## 2020-09-24 MED ORDER — HYDROCODONE-ACETAMINOPHEN 5-325 MG PO TABS: 1.0000 | ORAL_TABLET | Freq: Four times a day (QID) | ORAL | Status: DC | PRN

## 2020-09-24 MED ORDER — FUROSEMIDE 10 MG/ML IJ SOLN
100.0000 mg | Freq: Once | INTRAVENOUS | Status: DC
  Filled 2020-09-24: qty 10

## 2020-09-24 MED ORDER — PANTOPRAZOLE SODIUM 40 MG IV SOLR
40.0000 mg | Freq: Two times a day (BID) | INTRAVENOUS | Status: DC
  Administered 2020-09-24 – 2020-09-26 (×3): 40 mg via INTRAVENOUS
  Filled 2020-09-24 (×4): qty 40

## 2020-09-24 MED ORDER — LIDOCAINE-PRILOCAINE 2.5-2.5 % EX CREA
1.0000 "application " | TOPICAL_CREAM | CUTANEOUS | Status: DC | PRN
  Filled 2020-09-24: qty 5

## 2020-09-24 MED ORDER — FUROSEMIDE 10 MG/ML IJ SOLN
60.0000 mg | Freq: Once | INTRAMUSCULAR | Status: AC
  Administered 2020-09-24: 60 mg via INTRAVENOUS
  Filled 2020-09-24: qty 8

## 2020-09-24 MED ORDER — FUROSEMIDE 10 MG/ML IJ SOLN
40.0000 mg | Freq: Once | INTRAMUSCULAR | Status: AC
  Administered 2020-09-24: 40 mg via INTRAVENOUS
  Filled 2020-09-24: qty 4

## 2020-09-24 MED ORDER — OXYCODONE HCL 5 MG PO TABS
5.0000 mg | ORAL_TABLET | Freq: Four times a day (QID) | ORAL | Status: DC | PRN
  Administered 2020-09-24: 5 mg via ORAL
  Filled 2020-09-24: qty 1

## 2020-09-24 MED ORDER — ONDANSETRON HCL 4 MG/2ML IJ SOLN: 4.0000 mg | Freq: Four times a day (QID) | INTRAMUSCULAR | Status: DC | PRN

## 2020-09-24 MED ORDER — ONDANSETRON HCL 4 MG PO TABS: 4.0000 mg | ORAL_TABLET | Freq: Four times a day (QID) | ORAL | Status: DC | PRN

## 2020-09-24 MED ORDER — INSULIN ASPART 100 UNIT/ML ~~LOC~~ SOLN
0.0000 [IU] | SUBCUTANEOUS | Status: DC
  Administered 2020-09-25: 1 [IU] via SUBCUTANEOUS
  Filled 2020-09-24: qty 1

## 2020-09-24 MED ORDER — LISINOPRIL 20 MG PO TABS
20.0000 mg | ORAL_TABLET | Freq: Every day | ORAL | Status: DC
Start: 2020-09-25 — End: 2020-09-26
  Administered 2020-09-25 – 2020-09-26 (×2): 20 mg via ORAL
  Filled 2020-09-24: qty 2
  Filled 2020-09-24 (×2): qty 1

## 2020-09-24 MED ORDER — BENZONATATE 100 MG PO CAPS
100.0000 mg | ORAL_CAPSULE | Freq: Three times a day (TID) | ORAL | Status: DC | PRN
  Administered 2020-09-25 (×3): 100 mg via ORAL
  Filled 2020-09-24 (×4): qty 1

## 2020-09-24 MED ORDER — LABETALOL HCL 5 MG/ML IV SOLN: 20.0000 mg | Freq: Once | INTRAVENOUS | Status: AC

## 2020-09-24 MED ORDER — HYDRALAZINE HCL 50 MG PO TABS
25.0000 mg | ORAL_TABLET | Freq: Three times a day (TID) | ORAL | Status: DC
  Administered 2020-09-24 – 2020-09-26 (×5): 25 mg via ORAL
  Filled 2020-09-24 (×5): qty 1

## 2020-09-24 MED ORDER — FAMOTIDINE 20 MG PO TABS
20.0000 mg | ORAL_TABLET | Freq: Every day | ORAL | Status: DC
  Administered 2020-09-24 – 2020-09-26 (×2): 20 mg via ORAL
  Filled 2020-09-24 (×2): qty 1

## 2020-09-24 MED ORDER — METOPROLOL TARTRATE 50 MG PO TABS
50.0000 mg | ORAL_TABLET | Freq: Two times a day (BID) | ORAL | Status: DC
Start: 2020-09-24 — End: 2020-09-26
  Administered 2020-09-24 – 2020-09-26 (×3): 50 mg via ORAL
  Filled 2020-09-24 (×4): qty 1
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
## 2020-09-24 NOTE — ED Notes (Signed)
Respiratory advising this RN that pt is refusing BiPAP. Randol Kern NP made aware.

## 2020-09-24 NOTE — Progress Notes (Addendum)
[employer] by nursing for patient with increased work of breathing and continued hypertension despite nitro and CCB admin with systolic 982 in setting of fluid volume overload from ESRD and in need of HD.  Had 80 IV lasix approx 4 hours ago with only 200 cc urine output. Patient with oxygen saturation 100% with 2 liter [place] oxygen but only able to sit on edge of stretcher to breathe and still using some accessory muscles.   CXR and BNP reflecting fluid vou ST 102 RR 20-24 BP 203/98 BIPAP requested Labetalol for hypertensive urgency 20mg  x 1 Hold second unit PRBC until bipap in place and BP comes down  Update at  0300 - patient refused to try bipap. work of  breathing now much improved after labetalol and lasix. Tolerating blood transfusion and able to lay back of stretcher with only mild work of breathing likely at his baseline  - bipap discontinued

## 2020-09-24 NOTE — ED Notes (Signed)
Per GI, pt can eat. Will transition to clears tonight.

## 2020-09-24 NOTE — Progress Notes (Signed)
[facility]  ROUNDING NOTE   Subjective:   Patient with recent admission to the hospital for COVID-19 pneumonia. He was subsequently discharged. Recently missed outpatient hemodialysis treatment due to the weather earlier this week. Due for hemodialysis treatment today.   Objective:  Vital signs in last 24 hours:  Temp:  [98.1 F (36.7 C)-98.9 F (37.2 C)] 98.3 F (36.8 C) (01/19 0503) Pulse Rate:  [78-102] 88 (01/19 1130) Resp:  [9-24] 11 (01/19 1130) BP: (160-203)/(84-105) 185/105 (01/19 1130) SpO2:  [91 %-100 %] 98 % (01/19 1130)  Weight change:  Filed Weights   09/23/20 0929  Weight: 108.9 kg    Intake/Output: I/O last 3 completed shifts: In: 350 [Blood:350] Out: 200 [Urine:200]   Intake/Output this shift:  No intake/output data recorded.  Physical Exam: General: NAD, laying in bed  Head: Normocephalic, atraumatic. Moist oral mucosal membranes  Eyes: Anicteric  Neck: Supple, trachea midline  Lungs:  Normal effort  Heart: Regular  Abdomen:  Soft, nontender  Extremities: 1+ peripheral edema.  Neurologic: Nonfocal, moving all four extremities  Skin: No lesions  Access: Left AVF    Basic Metabolic Panel: Recent Labs  Lab 09/23/20 1010  NA 133*  K 4.5  CL 94*  CO2 24  GLUCOSE 97  BUN 62*  CREATININE 11.52*  CALCIUM 8.8*    Liver Function Tests: Recent Labs  Lab 09/23/20 1010  AST 20  ALT 13  ALKPHOS 48  BILITOT 1.3*  PROT 7.8  ALBUMIN 3.2*   Recent Labs  Lab 09/23/20 1010  LIPASE 31   No results for input(s): AMMONIA in the last 168 hours.  CBC: Recent Labs  Lab 09/23/20 1010 09/24/20 0033  WBC 9.1  --   HGB 6.5* 7.2*  HCT 20.7* 22.7*  MCV 87.0  --   PLT 207  --     Cardiac Enzymes: No results for input(s): CKTOTAL, CKMB, CKMBINDEX, TROPONINI in the last 168 hours.  BNP: Invalid input(s): POCBNP  CBG: Recent Labs  Lab 09/24/20 0059 09/24/20 0318 09/24/20 0818  GLUCAP 84 95 96     Microbiology: Results for orders placed or performed during the hospital encounter of 08/25/20  Resp Panel by RT-PCR (Flu A&B, Covid) Nasopharyngeal Swab     Status: None   Collection Time: 08/25/20 12:29 PM   Specimen: Nasopharyngeal Swab; Nasopharyngeal(NP) swabs in vial transport medium  Result Value Ref Range Status   SARS Coronavirus 2 by RT PCR NEGATIVE NEGATIVE Final    Comment: (NOTE) SARS-CoV-2 target nucleic acids are NOT DETECTED.  The SARS-CoV-2 RNA is generally detectable in upper respiratory specimens during the acute phase of infection. The lowest concentration of SARS-CoV-2 viral copies this assay can detect is 138 copies/mL. A negative result does not preclude SARS-Cov-2 infection and should not be used as the sole basis for treatment or other patient management decisions. A negative result may occur with  improper specimen collection/handling, submission of specimen other than nasopharyngeal swab, presence of viral mutation(s) within the areas targeted by this assay, and inadequate number of viral copies(<138 copies/mL). A negative result must be combined with clinical observations, patient history, and epidemiological information. The expected result is Negative.  Fact Sheet for Patients:  EntrepreneurPulse.com.au  Fact Sheet for Healthcare Providers:  IncredibleEmployment.be  This test is no t yet approved or cleared by the [place] FDA and  has been authorized for detection and/or diagnosis of SARS-CoV-2 by FDA under an Emergency Use Authorization (EUA). This EUA will remain  in  effect (meaning this test can be used) for the duration of the COVID-19 declaration under Section 564(b)(1) of the Act, 21 U.S.C.section 360bbb-3(b)(1), unless the authorization is terminated  or revoked sooner.       Influenza A by PCR NEGATIVE NEGATIVE Final   Influenza B by PCR NEGATIVE NEGATIVE Final    Comment: (NOTE) The Xpert  Xpress SARS-CoV-2/FLU/RSV plus assay is intended as an aid in the diagnosis of influenza from Nasopharyngeal swab specimens and should not be used as a sole basis for treatment. Nasal washings and aspirates are unacceptable for Xpert Xpress SARS-CoV-2/FLU/RSV testing.  Fact Sheet for Patients: EntrepreneurPulse.com.au  Fact Sheet for Healthcare Providers: IncredibleEmployment.be  This test is not yet approved or cleared by the [place] FDA and has been authorized for detection and/or diagnosis of SARS-CoV-2 by FDA under an Emergency Use Authorization (EUA). This EUA will remain in effect (meaning this test can be used) for the duration of the COVID-19 declaration under Section 564(b)(1) of the Act, 21 U.S.C. section 360bbb-3(b)(1), unless the authorization is terminated or revoked.  Performed at [facility], [address] [place], [place] [address]   Culture, blood (routine x 2)     Status: Abnormal   Collection Time: 08/25/20  1:35 PM   Specimen: BLOOD  Result Value Ref Range Status   Specimen Description   Final    BLOOD BLOOD RIGHT FOREARM Performed at [facility], [address], [place], [place] [address]    Special Requests   Final    BOTTLES DRAWN AEROBIC AND ANAEROBIC Blood Culture results may not be optimal due to an excessive volume of blood received in culture bottles Performed at [facility], [address] [place], [place] [address]    Culture  Setup Time   Final    GRAM POSITIVE COCCI IN BOTH AEROBIC AND ANAEROBIC BOTTLES Organism ID to follow CRITICAL RESULT CALLED TO, READ BACK BY AND VERIFIED WITH: SCOTT HALL $RemoveBe'@0250'[id number]$  08/26/20 RH Performed at [facility], [address] [place], [place] [address]    Culture METHICILLIN RESISTANT STAPHYLOCOCCUS AUREUS (A)  Final   Report Status 08/28/2020 FINAL  Final   Organism ID, Bacteria METHICILLIN RESISTANT STAPHYLOCOCCUS AUREUS   Final      Susceptibility   Methicillin resistant staphylococcus aureus - MIC*    CIPROFLOXACIN <=0.5 SENSITIVE Sensitive     ERYTHROMYCIN <=0.25 SENSITIVE Sensitive     GENTAMICIN <=0.5 SENSITIVE Sensitive     OXACILLIN >=4 RESISTANT Resistant     TETRACYCLINE <=1 SENSITIVE Sensitive     VANCOMYCIN <=0.5 SENSITIVE Sensitive     TRIMETH/SULFA <=10 SENSITIVE Sensitive     CLINDAMYCIN <=0.25 SENSITIVE Sensitive     RIFAMPIN <=0.5 SENSITIVE Sensitive     Inducible Clindamycin NEGATIVE Sensitive     * METHICILLIN RESISTANT STAPHYLOCOCCUS AUREUS  Culture, blood (routine x 2)     Status: None   Collection Time: 08/25/20  1:35 PM   Specimen: BLOOD  Result Value Ref Range Status   Specimen Description BLOOD BLOOD RIGHT HAND  Final   Special Requests   Final    BOTTLES DRAWN AEROBIC AND ANAEROBIC Blood Culture adequate volume   Culture   Final    NO GROWTH 5 DAYS Performed at [facility], [address] [place], [place] [address]    Report Status 08/30/2020 FINAL  Final  Blood Culture ID Panel (Reflexed)     Status: Abnormal   Collection Time: 08/25/20  1:35 PM  Result  Value Ref Range Status   Enterococcus faecalis NOT DETECTED NOT DETECTED Final   Enterococcus Faecium NOT DETECTED NOT DETECTED Final   Listeria monocytogenes NOT DETECTED NOT DETECTED Final   Staphylococcus species DETECTED (A) NOT DETECTED Final    Comment: CRITICAL RESULT CALLED TO, READ BACK BY AND VERIFIED WITH: SCOTT HALL $RemoveBe'@0250'[id number]$  08/26/20 RH    Staphylococcus aureus (BCID) DETECTED (A) NOT DETECTED Final    Comment: Methicillin (oxacillin)-resistant Staphylococcus aureus (MRSA). MRSA is predictably resistant to beta-lactam antibiotics (except ceftaroline). Preferred therapy is vancomycin unless clinically contraindicated. Patient requires contact precautions if  hospitalized. CRITICAL RESULT CALLED TO, READ BACK BY AND VERIFIED WITH: SCOTT HALL $RemoveBe'@0250'[id number]$  08/26/20 RH    Staphylococcus epidermidis NOT  DETECTED NOT DETECTED Final   Staphylococcus lugdunensis NOT DETECTED NOT DETECTED Final   Streptococcus species NOT DETECTED NOT DETECTED Final   Streptococcus agalactiae NOT DETECTED NOT DETECTED Final   Streptococcus pneumoniae NOT DETECTED NOT DETECTED Final   Streptococcus pyogenes NOT DETECTED NOT DETECTED Final   A.calcoaceticus-baumannii NOT DETECTED NOT DETECTED Final   Bacteroides fragilis NOT DETECTED NOT DETECTED Final   Enterobacterales NOT DETECTED NOT DETECTED Final   Enterobacter cloacae complex NOT DETECTED NOT DETECTED Final   Escherichia coli NOT DETECTED NOT DETECTED Final   Klebsiella aerogenes NOT DETECTED NOT DETECTED Final   Klebsiella oxytoca NOT DETECTED NOT DETECTED Final   Klebsiella pneumoniae NOT DETECTED NOT DETECTED Final   Proteus species NOT DETECTED NOT DETECTED Final   Salmonella species NOT DETECTED NOT DETECTED Final   Serratia marcescens NOT DETECTED NOT DETECTED Final   Haemophilus influenzae NOT DETECTED NOT DETECTED Final   Neisseria meningitidis NOT DETECTED NOT DETECTED Final   Pseudomonas aeruginosa NOT DETECTED NOT DETECTED Final   Stenotrophomonas maltophilia NOT DETECTED NOT DETECTED Final   Candida albicans NOT DETECTED NOT DETECTED Final   Candida auris NOT DETECTED NOT DETECTED Final   Candida glabrata NOT DETECTED NOT DETECTED Final   Candida krusei NOT DETECTED NOT DETECTED Final   Candida parapsilosis NOT DETECTED NOT DETECTED Final   Candida tropicalis NOT DETECTED NOT DETECTED Final   Cryptococcus neoformans/gattii NOT DETECTED NOT DETECTED Final   Meth resistant mecA/C and MREJ DETECTED (A) NOT DETECTED Final    Comment: CRITICAL RESULT CALLED TO, READ BACK BY AND VERIFIED WITH: SCOTT HALL $RemoveBe'@0250'[id number]$  08/26/20 RH Performed at [facility], [address], [place], [place] [address]   Surgical PCR screen     Status: Abnormal   Collection Time: 08/26/20  5:27 AM   Specimen: Nasal Mucosa; Nasal Swab  Result Value Ref Range  Status   MRSA, PCR POSITIVE (A) NEGATIVE Final    Comment: RESULT CALLED TO, READ BACK BY AND VERIFIED WITH: CANDACE SUMMERS AT 9892 08/26/20 SDR    Staphylococcus aureus POSITIVE (A) NEGATIVE Final    Comment: (NOTE) The Xpert SA Assay (FDA approved for NASAL specimens in patients 10 years of age and older), is one component of a comprehensive surveillance program. It is not intended to diagnose infection nor to guide or monitor treatment. Performed at [facility], [address] [place], [place] [address]   Anaerobic culture     Status: None   Collection Time: 08/26/20  5:27 PM   Specimen: PATH Bone biopsy; Tissue  Result Value Ref Range Status   Specimen Description   Final    BONE R DISTAL PHALANX BONE CULTURE Performed at [facility], [address], [place], [place] [address]    Special  Requests   Final    NONE Performed at [facility], [address] [place], [place] [address]    Gram Stain   Final    MODERATE WBC PRESENT, PREDOMINANTLY PMN ABUNDANT GRAM POSITIVE COCCI    Culture   Final    ABUNDANT METHICILLIN RESISTANT STAPHYLOCOCCUS AUREUS NO ANAEROBES ISOLATED Performed at [facility], [place] [address] [place], [place] [address]    Report Status 08/31/2020 FINAL  Final   Organism ID, Bacteria METHICILLIN RESISTANT STAPHYLOCOCCUS AUREUS  Final      Susceptibility   Methicillin resistant staphylococcus aureus - MIC*    CIPROFLOXACIN <=0.5 SENSITIVE Sensitive     ERYTHROMYCIN <=0.25 SENSITIVE Sensitive     GENTAMICIN <=0.5 SENSITIVE Sensitive     OXACILLIN >=4 RESISTANT Resistant     TETRACYCLINE <=1 SENSITIVE Sensitive     VANCOMYCIN 1 SENSITIVE Sensitive     TRIMETH/SULFA <=10 SENSITIVE Sensitive     CLINDAMYCIN <=0.25 SENSITIVE Sensitive     RIFAMPIN <=0.5 SENSITIVE Sensitive     Inducible Clindamycin NEGATIVE Sensitive     * ABUNDANT METHICILLIN RESISTANT STAPHYLOCOCCUS AUREUS  CULTURE, BLOOD (ROUTINE X 2) w Reflex  to ID Panel     Status: None   Collection Time: 08/28/20 11:36 AM   Specimen: BLOOD  Result Value Ref Range Status   Specimen Description BLOOD RIGHT [facility]  Final   Special Requests   Final    BOTTLES DRAWN AEROBIC AND ANAEROBIC Blood Culture adequate volume   Culture   Final    NO GROWTH 5 DAYS Performed at [facility], [address] [place], [place] [address]    Report Status 09/02/2020 FINAL  Final  CULTURE, BLOOD (ROUTINE X 2) w Reflex to ID Panel     Status: None   Collection Time: 08/28/20 11:42 AM   Specimen: BLOOD  Result Value Ref Range Status   Specimen Description BLOOD RIGHT HAND  Final   Special Requests   Final    BOTTLES DRAWN AEROBIC AND ANAEROBIC Blood Culture adequate volume   Culture   Final    NO GROWTH 5 DAYS Performed at [facility], [address] [place], [place] [address]    Report Status 09/02/2020 FINAL  Final    Coagulation Studies: No results for input(s): LABPROT, INR in the last 72 hours.  Urinalysis: No results for input(s): COLORURINE, LABSPEC, PHURINE, GLUCOSEU, HGBUR, BILIRUBINUR, KETONESUR, PROTEINUR, UROBILINOGEN, NITRITE, LEUKOCYTESUR in the last 72 hours.  Invalid input(s): APPERANCEUR    Imaging: CT ABDOMEN PELVIS WO CONTRAST  Result Date: 09/24/2020 CLINICAL DATA:  Abdominal pain. EXAM: CT ABDOMEN AND PELVIS WITHOUT CONTRAST TECHNIQUE: Multidetector CT imaging of the abdomen and pelvis was performed following the standard protocol without IV contrast. COMPARISON:  September 10, 1999 FINDINGS: Lower chest: There are small bilateral pleural effusions.There is cardiomegaly. The intracardiac blood pool is hypodense relative to the adjacent myocardium consistent with anemia. There is scattered hazy airspace opacities at the lung bases bilaterally. Hepatobiliary: The liver is normal. There are stones versus gallbladder sludge.There is no biliary ductal dilation. Pancreas: Normal contours without ductal dilatation. No  peripancreatic fluid collection. Spleen: Unremarkable. Adrenals/Urinary Tract: --Adrenal glands: Unremarkable. --Right kidney/ureter: No hydronephrosis or radiopaque kidney stones. --Left kidney/ureter: There is haziness and fat stranding about the left kidney which appears to be new since prior study. There is no hydronephrosis. There is an ill-defined masslike exophytic area arising from the left kidney measuring approximately 3 cm and 20 Hounsfield units. This  likely represents a cyst seen on the patient's recent ultrasound. --Urinary bladder: Unremarkable. Stomach/Bowel: --Stomach/Duodenum: No hiatal hernia or other gastric abnormality. Normal duodenal course and caliber. --Small bowel: Unremarkable. --Colon: Unremarkable. --Appendix: Normal. Vascular/Lymphatic: Atherosclerotic calcification is present within the non-aneurysmal abdominal aorta, without hemodynamically significant stenosis. --there is mild retroperitoneal adenopathy. --No mesenteric lymphadenopathy. --there is pelvic adenopathy with symmetric pelvic sidewall lymph nodes measuring up to approximately 1.5 cm. Reproductive: Unremarkable Other: There is a trace amount of free fluid in the abdomen. No free air. There are bilateral fat containing inguinal hernias. Musculoskeletal. No acute displaced fractures. IMPRESSION: 1. Diffuse hazy bilateral airspace opacities are noted at the lung bases with new bilateral pleural effusions and interlobular septal thickening. Findings may be secondary to developing pulmonary edema versus an atypical infectious process. 2. New fat stranding about the left kidney is nonspecific and may be secondary to pyelonephritis. Correlation with laboratory studies is recommended. 3. Mild retroperitoneal and pelvic adenopathy of unknown clinical significance. 4. Cardiomegaly. 5. Cholelithiasis without secondary signs of acute cholecystitis. 6. Trace amount of free fluid in the abdomen. Aortic Atherosclerosis (ICD10-I70.0).  Electronically Signed   By: Constance Holster M.D.   On: 09/24/2020 05:45   DG Chest 2 View  Result Date: 09/23/2020 CLINICAL DATA:  Shortness of breath.  COVID 19 pneumonia. EXAM: CHEST - 2 VIEW COMPARISON:  09/09/2020 FINDINGS: Midline trachea. Cardiomegaly, accentuated by low lung volumes on the frontal AP radiograph. Pulmonary interstitial prominence. Right greater than left base airspace disease. No pleural effusion or pneumothorax. Left glenoid fixation. IMPRESSION: Cardiomegaly and mild pulmonary interstitial prominence, suspicious for venous congestion. Bibasilar airspace disease is similar. Atelectasis versus multifocal pneumonia. Electronically Signed   By: Abigail Miyamoto M.D.   On: 09/23/2020 09:56     Medications:   . sodium chloride     . Chlorhexidine Gluconate Cloth  6 each Topical Q0600  . insulin aspart  0-9 Units Subcutaneous Q4H   acetaminophen **OR** acetaminophen, benzonatate, ondansetron **OR** ondansetron (ZOFRAN) IV, oxyCODONE  Assessment/ Plan:  Mr. Clement Deneault is a 60 y.o. black male with end stage renal disease on hemodialysis, hypertension, sleep apnea, diabetes mellitus type II, chronic back pain, right toe amputation who is admitted to [facility] on 09/23/2020 for Melena [K92.1] Fluid overload [E87.70]  [facility] [place] Left AVF 115.5kg  1. End stage renal disease:  Outpatient cohort COVID-19 shift at [facility] TTS second shift. -  Patient missed dialysis session yesterday.  Therefore we are planning for dialysis today.  Orders prepared.  2. Hypertension: Patient was previously on metoprolol, lisinopril, amlodipine.   3. Anemia with chronic kidney disease:   -Resume Epogen as an outpatient.  4. Secondary Hyperparathyroidism: outpatient PTH, calcium and phosphorus at goal.  -Patient previously was on calcium acetate as an outpatient.  Recommend reinitiation as an outpatient.   LOS: 0 Ellsworth Waldschmidt 1/19/202212:24 PM

## 2020-09-24 NOTE — ED Notes (Signed)
Per GI NP, hold PO until decision is made regarding procedures

## 2020-09-24 NOTE — Progress Notes (Signed)
Pharmacy Antibiotic Note  Steve Vance is a 60 y.o. male admitted on 09/09/2020 with COVID. Patient has past medication history of ESRD (HD Tu,Thurs, Sat), HTN, Uncontrolled DM, and obesity. Was recently admitted (08/25/2020) with osteomyelitis and MRSA bacteremia. Amputation and debridementdone12/21/2021 - resected margin clear of osteo. Per ID recs from last admission, patient to be on vancomycin until 09/25/2020 for a total of 4 weeks. Pharmacy has been consulted for Vancomycin dosing.  Patient missed his dialysis session on 1/18. Per discussion with dialysis nurse, pt already received dialysis this morning (1/19) and did not receive a dose of vancomycin.  Plan: Vanc random level was subtherapeutic at 91mcg/mL likely due to pt receiving dialysis today and level drawn s/p dialysis and did not receive a vancomycin dose. Will reload patient with 2g and recheck level post-HD 4-6hrs after redistribution pending dialysis schedule as patient last dose of vancomycin is scheduled for 09/25/2020.  Follow up with HD schedule and ID in case therapy needs to be extended.   Height: $Remove'5\' 11"'IBtrYdA$  (180.3 cm) Weight: 108.9 kg (240 lb) IBW/kg (Calculated) : 75.3  Temp (24hrs), Avg:98.5 F (36.9 C), Min:98.1 F (36.7 C), Max:98.9 F (37.2 C)  Recent Labs  Lab 09/23/20 1010 09/24/20 1758  WBC 9.1  --   CREATININE 11.52*  --   VANCORANDOM  --  7    Estimated Creatinine Clearance: 8.4 mL/min (A) (by C-G formula based on SCr of 11.52 mg/dL (H)).    Allergies  Allergen Reactions  . Baclofen Nausea And Vomiting  . Gabapentin Nausea And Vomiting    Antimicrobials this admission: 12/20 Vancomycin >>   Microbiology results: 12/20 BCx: MRSA 12/21 MRSA PCR: (+) 12/21 Anaerobic culture: GPC   Thank you for allowing pharmacy to be a part of this patient's care.   Sherilyn Banker, PharmD Pharmacy Resident  09/24/2020 7:55 PM
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
## 2020-09-24 NOTE — ED Notes (Addendum)
Post transfusion VS complete.  VS remain stable at this time.

## 2020-09-24 NOTE — ED Notes (Signed)
Pt tolerating 2nd unit of blood well.  Rate increased to 150 mL/hr.

## 2020-09-24 NOTE — ED Notes (Signed)
Respiratory at bedside to place pt on BiPAP.

## 2020-09-24 NOTE — Progress Notes (Signed)
PROGRESS NOTE    Draycen Leichter  PJK:[id number] DOB: 14-Jun-1961 DOA: 09/23/2020 PCP: Theotis Burrow, MD      Brief Narrative:  [name] is a 60 y.o. male with medical history significant for ESRD on HD TTS, anemia of CKD, baseline 8-11, HTN, DM, MRSA bacteremia on vancomycin at hemodialysis until 1/20, recently hospitalized from 1/4-1/7 for respiratory failure related to severe COVID and missed dialysis, who presents to the emergency room with melena stool x1 day and shortness of breath x2 days.        Assessment & Plan:  Melena Acute blood loss anemia -Consult GI, appreciate cares -Trend Hgb -Hold home aspirin -Continue IV PPI   Acute dyspnea  Anemia plus fluid overload, plus COVID.    Hypertensive urgency -Continue IV labetalol as needed -Resume amlodipine, lisinopril, Hydralazine, metoprolol  Fluid overload Missed dialysis in patient with ESRD on dialysis TTS -Continue IV Lasix -Consult Nephrology, appreciate cares  Type 2 diabetes mellitus with hyperlipidemia ([facility]) -Continue SS corrections    MRSA bacteremia -Continue IV Vancomycin - Last dose 1s scheduled for 09/25/2020       Disposition: Status is: Inpatient  Remains inpatient appropriate because:IV treatments appropriate due to intensity of illness or inability to take PO   Dispo: The patient is from: SNF              Anticipated d/c is to: SNF              Anticipated d/c date is: 1 day              Patient currently is not medically stable to d/c.              MDM: This is a no charge note.  For further details, please see H&P by my partner Dr. Damita Dunnings from earlier today.  The below labs and imaging reports were reviewed and summarized above.    DVT prophylaxis: SCDs Start: 09/24/20 0024  Code Status: FULL Family Communication:     Consultants:   Nephrology  GI  Procedures:   1/19 CT abdomen and pelvis -- kidney stranding, pneumonia, no other acute  findings  Antimicrobials:   Vancomycin to end 1/20   Culture data:   None           Subjective: Paitent tired, out of breath.        Objective: Vitals:   09/24/20 0503 09/24/20 0830 09/24/20 1130 09/24/20 1400  BP: (!) 179/103 (!) 183/98 (!) 185/105 (!) 175/91  Pulse: 87 82 88 88  Resp: 19 (!) $Remo'22 11 16  'LsyMS$ Temp: 98.3 F (36.8 C)     TempSrc: Oral     SpO2: 97% 95% 98% 95%  Weight:      Height:        Intake/Output Summary (Last 24 hours) at 09/24/2020 1455 Last data filed at 09/24/2020 0000 Gross per 24 hour  Intake 350 ml  Output 200 ml  Net 150 ml   Filed Weights   09/23/20 0929  Weight: 108.9 kg    Examination: The patient was seen and examined.      Data Reviewed: I have personally reviewed following labs and imaging studies:  CBC: Recent Labs  Lab 09/23/20 1010 09/24/20 0033  WBC 9.1  --   HGB 6.5* 7.2*  HCT 20.7* 22.7*  MCV 87.0  --   PLT 207  --    Basic Metabolic Panel: Recent Labs  Lab 09/23/20 1010  NA 133*  K 4.5  CL 94*  CO2 24  GLUCOSE 97  BUN 62*  CREATININE 11.52*  CALCIUM 8.8*   GFR: Estimated Creatinine Clearance: 8.4 mL/min (A) (by C-G formula based on SCr of 11.52 mg/dL (H)). Liver Function Tests: Recent Labs  Lab 09/23/20 1010  AST 20  ALT 13  ALKPHOS 48  BILITOT 1.3*  PROT 7.8  ALBUMIN 3.2*   Recent Labs  Lab 09/23/20 1010  LIPASE 31   No results for input(s): AMMONIA in the last 168 hours. Coagulation Profile: No results for input(s): INR, PROTIME in the last 168 hours. Cardiac Enzymes: No results for input(s): CKTOTAL, CKMB, CKMBINDEX, TROPONINI in the last 168 hours. BNP (last 3 results) No results for input(s): PROBNP in the last 8760 hours. HbA1C: No results for input(s): HGBA1C in the last 72 hours. CBG: Recent Labs  Lab 09/24/20 0059 09/24/20 0318 09/24/20 0818  GLUCAP 84 95 96   Lipid Profile: No results for input(s): CHOL, HDL, LDLCALC, TRIG, CHOLHDL, LDLDIRECT in the last  72 hours. Thyroid Function Tests: No results for input(s): TSH, T4TOTAL, FREET4, T3FREE, THYROIDAB in the last 72 hours. Anemia Panel: No results for input(s): VITAMINB12, FOLATE, FERRITIN, TIBC, IRON, RETICCTPCT in the last 72 hours. Urine analysis:    Component Value Date/Time   COLORURINE AMBER (A) 08/25/2020 1335   APPEARANCEUR HAZY (A) 08/25/2020 1335   APPEARANCEUR Cloudy (A) 07/30/2019 1301   LABSPEC 1.009 08/25/2020 1335   PHURINE 8.0 08/25/2020 1335   GLUCOSEU 50 (A) 08/25/2020 1335   HGBUR LARGE (A) 08/25/2020 1335   BILIRUBINUR NEGATIVE 08/25/2020 1335   BILIRUBINUR Negative 07/30/2019 1301   KETONESUR NEGATIVE 08/25/2020 1335   PROTEINUR >=300 (A) 08/25/2020 1335   NITRITE NEGATIVE 08/25/2020 1335   LEUKOCYTESUR TRACE (A) 08/25/2020 1335   Sepsis Labs: $RemoveBefo'[email or web address]'[id number]$ (procalcitonin:4,lacticacidven:4)  )No results found for this or any previous visit (from the past 240 hour(s)).       Radiology Studies: CT ABDOMEN PELVIS WO CONTRAST  Result Date: 09/24/2020 CLINICAL DATA:  Abdominal pain. EXAM: CT ABDOMEN AND PELVIS WITHOUT CONTRAST TECHNIQUE: Multidetector CT imaging of the abdomen and pelvis was performed following the standard protocol without IV contrast. COMPARISON:  September 10, 1999 FINDINGS: Lower chest: There are small bilateral pleural effusions.There is cardiomegaly. The intracardiac blood pool is hypodense relative to the adjacent myocardium consistent with anemia. There is scattered hazy airspace opacities at the lung bases bilaterally. Hepatobiliary: The liver is normal. There are stones versus gallbladder sludge.There is no biliary ductal dilation. Pancreas: Normal contours without ductal dilatation. No peripancreatic fluid collection. Spleen: Unremarkable. Adrenals/Urinary Tract: --Adrenal glands: Unremarkable. --Right kidney/ureter: No hydronephrosis or radiopaque kidney stones. --Left kidney/ureter: There is haziness and fat stranding about the left kidney  which appears to be new since prior study. There is no hydronephrosis. There is an ill-defined masslike exophytic area arising from the left kidney measuring approximately 3 cm and 20 Hounsfield units. This likely represents a cyst seen on the patient's recent ultrasound. --Urinary bladder: Unremarkable. Stomach/Bowel: --Stomach/Duodenum: No hiatal hernia or other gastric abnormality. Normal duodenal course and caliber. --Small bowel: Unremarkable. --Colon: Unremarkable. --Appendix: Normal. Vascular/Lymphatic: Atherosclerotic calcification is present within the non-aneurysmal abdominal aorta, without hemodynamically significant stenosis. --there is mild retroperitoneal adenopathy. --No mesenteric lymphadenopathy. --there is pelvic adenopathy with symmetric pelvic sidewall lymph nodes measuring up to approximately 1.5 cm. Reproductive: Unremarkable Other: There is a trace amount of free fluid in the abdomen. No free air. There are bilateral fat containing inguinal hernias. Musculoskeletal. No acute displaced fractures. IMPRESSION: 1.  Diffuse hazy bilateral airspace opacities are noted at the lung bases with new bilateral pleural effusions and interlobular septal thickening. Findings may be secondary to developing pulmonary edema versus an atypical infectious process. 2. New fat stranding about the left kidney is nonspecific and may be secondary to pyelonephritis. Correlation with laboratory studies is recommended. 3. Mild retroperitoneal and pelvic adenopathy of unknown clinical significance. 4. Cardiomegaly. 5. Cholelithiasis without secondary signs of acute cholecystitis. 6. Trace amount of free fluid in the abdomen. Aortic Atherosclerosis (ICD10-I70.0). Electronically Signed   By: Constance Holster M.D.   On: 09/24/2020 05:45   DG Chest 2 View  Result Date: 09/23/2020 CLINICAL DATA:  Shortness of breath.  COVID 19 pneumonia. EXAM: CHEST - 2 VIEW COMPARISON:  09/09/2020 FINDINGS: Midline trachea. Cardiomegaly,  accentuated by low lung volumes on the frontal AP radiograph. Pulmonary interstitial prominence. Right greater than left base airspace disease. No pleural effusion or pneumothorax. Left glenoid fixation. IMPRESSION: Cardiomegaly and mild pulmonary interstitial prominence, suspicious for venous congestion. Bibasilar airspace disease is similar. Atelectasis versus multifocal pneumonia. Electronically Signed   By: Abigail Miyamoto M.D.   On: 09/23/2020 09:56        Scheduled Meds: . Chlorhexidine Gluconate Cloth  6 each Topical Q0600  . insulin aspart  0-9 Units Subcutaneous Q4H  . pantoprazole (PROTONIX) IV  40 mg Intravenous Q12H   Continuous Infusions: . sodium chloride    . sodium chloride    . sodium chloride       LOS: 0 days    Time spent: 25 minutes    Edwin Dada, MD [facility] 09/24/2020, 2:55 PM     Please page though [facility] or Epic secure chat:  For password, contact charge nurse

## 2020-09-24 NOTE — ED Notes (Signed)
Pt called out using call bell for endorsing shortness of breath. O2 saturation 100% on 2L nasal cannula. Randol Kern NP at bedside, advising to call respiratory for BiPAP. Respiratory called by Lynita Lombard.

## 2020-09-24 NOTE — ED Notes (Signed)
Lab tech at bedside advising unable to obtain lab samples. Advising labs can be done when pt goes to dialysis this AM.

## 2020-09-24 NOTE — ED Notes (Signed)
This RN inquired about holding second blood transfusion due to possible fluid overload. Randol Kern NP advising hold second blood transfusion until pt can be on BiPAP.

## 2020-09-24 NOTE — ED Notes (Signed)
Pt sitting on edge of bed, states is more comfortable. Respiratory advising will bring BiPAP as soon as possible.

## 2020-09-24 NOTE — ED Notes (Signed)
Pt eating breakfast 

## 2020-09-25 ENCOUNTER — Encounter: Payer: Self-pay | Admitting: Family Medicine

## 2020-09-25 ENCOUNTER — Encounter
Admission: EM | Disposition: A | Discharge status: Home / Self Care | Payer: Self-pay | Source: Home / Self Care | Attending: Family Medicine

## 2020-09-25 ENCOUNTER — Inpatient Hospital Stay: Payer: Medicare HMO | Admitting: Infectious Diseases

## 2020-09-25 ENCOUNTER — Inpatient Hospital Stay: Payer: Medicare HMO | Admitting: Certified Registered Nurse Anesthetist

## 2020-09-25 ENCOUNTER — Telehealth: Payer: Medicare HMO | Admitting: Infectious Diseases

## 2020-09-25 HISTORY — PX: ESOPHAGOGASTRODUODENOSCOPY: SHX5428

## 2020-09-25 LAB — CBC
HCT: 24.1 % — ABNORMAL LOW (ref 39.0–52.0)
Hemoglobin: 7.8 g/dL — ABNORMAL LOW (ref 13.0–17.0)
MCH: 28.9 pg (ref 26.0–34.0)
MCHC: 32.4 g/dL (ref 30.0–36.0)
MCV: 89.3 fL (ref 80.0–100.0)
Platelets: 216 10*3/uL (ref 150–400)
RBC: 2.7 MIL/uL — ABNORMAL LOW (ref 4.22–5.81)
RDW: 15.8 % — ABNORMAL HIGH (ref 11.5–15.5)
WBC: 8.7 10*3/uL (ref 4.0–10.5)
nRBC: 0 % (ref 0.0–0.2)

## 2020-09-25 LAB — RENAL FUNCTION PANEL
Albumin: 2.9 g/dL — ABNORMAL LOW (ref 3.5–5.0)
Anion gap: 13 (ref 5–15)
BUN: 43 mg/dL — ABNORMAL HIGH (ref 8–23)
CO2: 24 mmol/L (ref 22–32)
Calcium: 8.7 mg/dL — ABNORMAL LOW (ref 8.9–10.3)
Chloride: 99 mmol/L (ref 98–111)
Creatinine, Ser: 9.11 mg/dL — ABNORMAL HIGH (ref 0.61–1.24)
GFR, Estimated: 6 mL/min — ABNORMAL LOW (ref >60)
Glucose, Bld: 90 mg/dL (ref 70–99)
Phosphorus: 5.6 mg/dL — ABNORMAL HIGH (ref 2.5–4.6)
Potassium: 4.3 mmol/L (ref 3.5–5.1)
Sodium: 136 mmol/L (ref 135–145)

## 2020-09-25 LAB — TYPE AND SCREEN
ABO/RH(D): AB POS
Antibody Screen: NEGATIVE
Unit division: 0
Unit division: 0

## 2020-09-25 LAB — HEPATITIS B DNA, ULTRAQUANTITATIVE, PCR
HBV DNA SERPL PCR-ACNC: NOT DETECTED IU/mL
HBV DNA SERPL PCR-LOG IU: UNDETERMINED log10 IU/mL

## 2020-09-25 LAB — CBG MONITORING, ED
Glucose-Capillary: 80 mg/dL (ref 70–99)
Glucose-Capillary: 90 mg/dL (ref 70–99)
Glucose-Capillary: 93 mg/dL (ref 70–99)

## 2020-09-25 LAB — BPAM RBC
Blood Product Expiration Date: 202202112359
Blood Product Expiration Date: 202202112359
ISSUE DATE / TIME: 202201182103
ISSUE DATE / TIME: 202201190218
Unit Type and Rh: 6200
Unit Type and Rh: 6200

## 2020-09-25 LAB — GLUCOSE, CAPILLARY
Glucose-Capillary: 73 mg/dL (ref 70–99)
Glucose-Capillary: 140 mg/dL — ABNORMAL HIGH (ref 70–99)

## 2020-09-25 LAB — PARATHYROID HORMONE, INTACT (NO CA): PTH: 301 pg/mL — ABNORMAL HIGH (ref 15–65)

## 2020-09-25 LAB — HEPATITIS B SURFACE ANTIGEN: Hepatitis B Surface Ag: NONREACTIVE

## 2020-09-25 LAB — PHOSPHORUS: Phosphorus: 6.1 mg/dL — ABNORMAL HIGH (ref 2.5–4.6)

## 2020-09-25 LAB — HEPATITIS B SURFACE ANTIBODY, QUANTITATIVE: Hep B S AB Quant (Post): <3.1 m[IU]/mL — ABNORMAL LOW (ref >9.9)

## 2020-09-25 SURGERY — EGD (ESOPHAGOGASTRODUODENOSCOPY)
Anesthesia: General

## 2020-09-25 MED ORDER — GLYCOPYRROLATE 0.2 MG/ML IJ SOLN
INTRAMUSCULAR | Status: DC | PRN
  Administered 2020-09-25: .2 mg via INTRAVENOUS

## 2020-09-25 MED ORDER — SODIUM CHLORIDE 0.9 % IV SOLN
INTRAVENOUS | Status: DC
  Administered 2020-09-25: 500 mL via INTRAVENOUS

## 2020-09-25 MED ORDER — PROPOFOL 10 MG/ML IV BOLUS
INTRAVENOUS | Status: DC | PRN
  Administered 2020-09-25: 60 mg via INTRAVENOUS
  Administered 2020-09-25: 20 mg via INTRAVENOUS

## 2020-09-25 MED ORDER — LISINOPRIL 20 MG PO TABS
20.0000 mg | ORAL_TABLET | Freq: Once | ORAL | Status: AC
  Administered 2020-09-25: 20 mg via ORAL
  Filled 2020-09-25: qty 1

## 2020-09-25 MED ORDER — LIDOCAINE HCL (CARDIAC) PF 100 MG/5ML IV SOSY
PREFILLED_SYRINGE | INTRAVENOUS | Status: DC | PRN
  Administered 2020-09-25: 100 mg via INTRAVENOUS

## 2020-09-25 MED ORDER — KETAMINE HCL 10 MG/ML IJ SOLN
INTRAMUSCULAR | Status: DC | PRN
  Administered 2020-09-25: 50 mg via INTRAVENOUS

## 2020-09-25 MED ORDER — CALCIUM ACETATE (PHOS BINDER) 667 MG PO CAPS
1334.0000 mg | ORAL_CAPSULE | Freq: Three times a day (TID) | ORAL | Status: DC
  Administered 2020-09-26: 1334 mg via ORAL
  Filled 2020-09-25 (×3): qty 2

## 2020-09-25 MED ORDER — PROPOFOL 500 MG/50ML IV EMUL
INTRAVENOUS | Status: AC
  Filled 2020-09-25: qty 50

## 2020-09-25 MED ORDER — KETAMINE HCL 50 MG/5ML IJ SOSY
PREFILLED_SYRINGE | INTRAMUSCULAR | Status: AC
  Filled 2020-09-25: qty 5

## 2020-09-25 MED ORDER — PROPOFOL 500 MG/50ML IV EMUL
INTRAVENOUS | Status: DC | PRN
  Administered 2020-09-25: 150 ug/kg/min via INTRAVENOUS

## 2020-09-25 MED ORDER — VANCOMYCIN HCL IN DEXTROSE 1-5 GM/200ML-% IV SOLN
1000.0000 mg | Freq: Once | INTRAVENOUS | Status: AC
  Administered 2020-09-25: 1000 mg via INTRAVENOUS
  Filled 2020-09-25: qty 200

## 2020-09-25 MED ORDER — DEXMEDETOMIDINE HCL 200 MCG/2ML IV SOLN
INTRAVENOUS | Status: DC | PRN
  Administered 2020-09-25: 8 ug via INTRAVENOUS

## 2020-09-25 MED ORDER — LIDOCAINE HCL (PF) 2 % IJ SOLN
INTRAMUSCULAR | Status: AC
  Filled 2020-09-25: qty 5

## 2020-09-25 MED ORDER — AMLODIPINE BESYLATE 10 MG PO TABS
10.0000 mg | ORAL_TABLET | Freq: Once | ORAL | Status: AC
  Administered 2020-09-25: 10 mg via ORAL
  Filled 2020-09-25: qty 1

## 2020-09-25 MED ORDER — METOPROLOL TARTRATE 5 MG/5ML IV SOLN
INTRAVENOUS | Status: AC
  Filled 2020-09-25: qty 5

## 2020-09-25 MED ORDER — GLYCOPYRROLATE 0.2 MG/ML IJ SOLN
INTRAMUSCULAR | Status: AC
  Filled 2020-09-25: qty 1

## 2020-09-25 MED ORDER — METOPROLOL TARTRATE 5 MG/5ML IV SOLN
INTRAVENOUS | Status: DC | PRN
  Administered 2020-09-25: 3 mg via INTRAVENOUS
  Administered 2020-09-25: 5 mg via INTRAVENOUS
  Administered 2020-09-25: 2 mg via INTRAVENOUS

## 2020-09-25 NOTE — ED Notes (Signed)
Report received on patient at this time, patient currently in dialysis. Will introduce myself once patient returns to room.

## 2020-09-25 NOTE — Progress Notes (Signed)
[facility]  ROUNDING NOTE   Subjective:   Patient underwent hemodialysis yesterday. Due for hemodialysis treatment again today as we are performing hemodialysis on COVID-positive patients on TTS.   Objective:  Vital signs in last 24 hours:  Temp:  [97 F (36.1 C)-98.7 F (37.1 C)] 97.8 F (36.6 C) (01/20 1733) Pulse Rate:  [77-123] 96 (01/20 1733) Resp:  [3-25] 20 (01/20 1733) BP: (127-251)/(50-159) 200/112 (01/20 1733) SpO2:  [4 %-100 %] 100 % (01/20 1733)  Weight change:  Filed Weights   09/23/20 0929  Weight: 108.9 kg    Intake/Output: I/O last 3 completed shifts: In: 750 [Blood:350; IV Piggyback:400] Out: 2200 [Urine:200; Other:2000]   Intake/Output this shift:  Total I/O In: 100 [I.V.:100] Out: 1780 [Urine:280; Other:1500]  Physical Exam: General: NAD, laying in bed  Head: Normocephalic, atraumatic. Moist oral mucosal membranes  Eyes: Anicteric  Neck: Supple, trachea midline  Lungs:  Normal effort  Heart: Regular  Abdomen:  Soft, nontender  Extremities: 1+ peripheral edema.  Neurologic: Nonfocal, moving all four extremities  Skin: No lesions  Access: Left AVF    Basic Metabolic Panel: Recent Labs  Lab 09/23/20 1010 09/24/20 1647 09/25/20 0408 09/25/20 1050  NA 133*  --  136  --   K 4.5  --  4.3  --   CL 94*  --  99  --   CO2 24  --  24  --   GLUCOSE 97  --  90  --   BUN 62*  --  43*  --   CREATININE 11.52*  --  9.11*  --   CALCIUM 8.8*  --  8.7*  --   PHOS  --  6.0* 5.6* 6.1*    Liver Function Tests: Recent Labs  Lab 09/23/20 1010 09/25/20 0408  AST 20  --   ALT 13  --   ALKPHOS 48  --   BILITOT 1.3*  --   PROT 7.8  --   ALBUMIN 3.2* 2.9*   Recent Labs  Lab 09/23/20 1010  LIPASE 31   No results for input(s): AMMONIA in the last 168 hours.  CBC: Recent Labs  Lab 09/23/20 1010 09/24/20 0033 09/24/20 1647 09/25/20 0408  WBC 9.1  --   --  8.7  HGB 6.5* 7.2* 7.8* 7.8*  HCT 20.7* 22.7* 23.5* 24.1*  MCV 87.0   --   --  89.3  PLT 207  --   --  216    Cardiac Enzymes: No results for input(s): CKTOTAL, CKMB, CKMBINDEX, TROPONINI in the last 168 hours.  BNP: Invalid input(s): POCBNP  CBG: Recent Labs  Lab 09/24/20 1300 09/24/20 2358 09/25/20 0433 09/25/20 0736 09/25/20 1728  GLUCAP 90 93 90 80 73    Microbiology: Results for orders placed or performed during the hospital encounter of 08/25/20  Resp Panel by RT-PCR (Flu A&B, Covid) Nasopharyngeal Swab     Status: None   Collection Time: 08/25/20 12:29 PM   Specimen: Nasopharyngeal Swab; Nasopharyngeal(NP) swabs in vial transport medium  Result Value Ref Range Status   SARS Coronavirus 2 by RT PCR NEGATIVE NEGATIVE Final    Comment: (NOTE) SARS-CoV-2 target nucleic acids are NOT DETECTED.  The SARS-CoV-2 RNA is generally detectable in upper respiratory specimens during the acute phase of infection. The lowest concentration of SARS-CoV-2 viral copies this assay can detect is 138 copies/mL. A negative result does not preclude SARS-Cov-2 infection and should not be used as the sole basis for treatment or other patient  management decisions. A negative result may occur with  improper specimen collection/handling, submission of specimen other than nasopharyngeal swab, presence of viral mutation(s) within the areas targeted by this assay, and inadequate number of viral copies(<138 copies/mL). A negative result must be combined with clinical observations, patient history, and epidemiological information. The expected result is Negative.  Fact Sheet for Patients:  EntrepreneurPulse.com.au  Fact Sheet for Healthcare Providers:  IncredibleEmployment.be  This test is no t yet approved or cleared by the [place] FDA and  has been authorized for detection and/or diagnosis of SARS-CoV-2 by FDA under an Emergency Use Authorization (EUA). This EUA will remain  in effect (meaning this test can be used)  for the duration of the COVID-19 declaration under Section 564(b)(1) of the Act, 21 U.S.C.section 360bbb-3(b)(1), unless the authorization is terminated  or revoked sooner.       Influenza A by PCR NEGATIVE NEGATIVE Final   Influenza B by PCR NEGATIVE NEGATIVE Final    Comment: (NOTE) The Xpert Xpress SARS-CoV-2/FLU/RSV plus assay is intended as an aid in the diagnosis of influenza from Nasopharyngeal swab specimens and should not be used as a sole basis for treatment. Nasal washings and aspirates are unacceptable for Xpert Xpress SARS-CoV-2/FLU/RSV testing.  Fact Sheet for Patients: EntrepreneurPulse.com.au  Fact Sheet for Healthcare Providers: IncredibleEmployment.be  This test is not yet approved or cleared by the [place] FDA and has been authorized for detection and/or diagnosis of SARS-CoV-2 by FDA under an Emergency Use Authorization (EUA). This EUA will remain in effect (meaning this test can be used) for the duration of the COVID-19 declaration under Section 564(b)(1) of the Act, 21 U.S.C. section 360bbb-3(b)(1), unless the authorization is terminated or revoked.  Performed at [facility], [address] [place], [place] [address]   Culture, blood (routine x 2)     Status: Abnormal   Collection Time: 08/25/20  1:35 PM   Specimen: BLOOD  Result Value Ref Range Status   Specimen Description   Final    BLOOD BLOOD RIGHT FOREARM Performed at [facility], [address], [place], [place] [address]    Special Requests   Final    BOTTLES DRAWN AEROBIC AND ANAEROBIC Blood Culture results may not be optimal due to an excessive volume of blood received in culture bottles Performed at [facility], [address], [place], [place] [address]    Culture  Setup Time   Final    GRAM POSITIVE COCCI IN BOTH AEROBIC AND ANAEROBIC BOTTLES Organism ID to follow CRITICAL RESULT CALLED TO, READ BACK BY AND  VERIFIED WITH: SCOTT HALL $RemoveBe'@0250'[id number]$  08/26/20 RH Performed at [facility], [address] [place], [place] [address]    Culture METHICILLIN RESISTANT STAPHYLOCOCCUS AUREUS (A)  Final   Report Status 08/28/2020 FINAL  Final   Organism ID, Bacteria METHICILLIN RESISTANT STAPHYLOCOCCUS AUREUS  Final      Susceptibility   Methicillin resistant staphylococcus aureus - MIC*    CIPROFLOXACIN <=0.5 SENSITIVE Sensitive     ERYTHROMYCIN <=0.25 SENSITIVE Sensitive     GENTAMICIN <=0.5 SENSITIVE Sensitive     OXACILLIN >=4 RESISTANT Resistant     TETRACYCLINE <=1 SENSITIVE Sensitive     VANCOMYCIN <=0.5 SENSITIVE Sensitive     TRIMETH/SULFA <=10 SENSITIVE Sensitive     CLINDAMYCIN <=0.25 SENSITIVE Sensitive     RIFAMPIN <=0.5 SENSITIVE Sensitive     Inducible Clindamycin NEGATIVE Sensitive     * METHICILLIN RESISTANT STAPHYLOCOCCUS AUREUS  Culture, blood (routine  x 2)     Status: None   Collection Time: 08/25/20  1:35 PM   Specimen: BLOOD  Result Value Ref Range Status   Specimen Description BLOOD BLOOD RIGHT HAND  Final   Special Requests   Final    BOTTLES DRAWN AEROBIC AND ANAEROBIC Blood Culture adequate volume   Culture   Final    NO GROWTH 5 DAYS Performed at [facility], [address] [place], [place] [address]    Report Status 08/30/2020 FINAL  Final  Blood Culture ID Panel (Reflexed)     Status: Abnormal   Collection Time: 08/25/20  1:35 PM  Result Value Ref Range Status   Enterococcus faecalis NOT DETECTED NOT DETECTED Final   Enterococcus Faecium NOT DETECTED NOT DETECTED Final   Listeria monocytogenes NOT DETECTED NOT DETECTED Final   Staphylococcus species DETECTED (A) NOT DETECTED Final    Comment: CRITICAL RESULT CALLED TO, READ BACK BY AND VERIFIED WITH: SCOTT HALL $RemoveBe'@0250'[id number]$  08/26/20 RH    Staphylococcus aureus (BCID) DETECTED (A) NOT DETECTED Final    Comment: Methicillin (oxacillin)-resistant Staphylococcus aureus (MRSA). MRSA is predictably resistant  to beta-lactam antibiotics (except ceftaroline). Preferred therapy is vancomycin unless clinically contraindicated. Patient requires contact precautions if  hospitalized. CRITICAL RESULT CALLED TO, READ BACK BY AND VERIFIED WITH: SCOTT HALL $RemoveBe'@0250'[email or web address]$  08/26/20 RH    Staphylococcus epidermidis NOT DETECTED NOT DETECTED Final   Staphylococcus lugdunensis NOT DETECTED NOT DETECTED Final   Streptococcus species NOT DETECTED NOT DETECTED Final   Streptococcus agalactiae NOT DETECTED NOT DETECTED Final   Streptococcus pneumoniae NOT DETECTED NOT DETECTED Final   Streptococcus pyogenes NOT DETECTED NOT DETECTED Final   A.calcoaceticus-baumannii NOT DETECTED NOT DETECTED Final   Bacteroides fragilis NOT DETECTED NOT DETECTED Final   Enterobacterales NOT DETECTED NOT DETECTED Final   Enterobacter cloacae complex NOT DETECTED NOT DETECTED Final   Escherichia coli NOT DETECTED NOT DETECTED Final   Klebsiella aerogenes NOT DETECTED NOT DETECTED Final   Klebsiella oxytoca NOT DETECTED NOT DETECTED Final   Klebsiella pneumoniae NOT DETECTED NOT DETECTED Final   Proteus species NOT DETECTED NOT DETECTED Final   Salmonella species NOT DETECTED NOT DETECTED Final   Serratia marcescens NOT DETECTED NOT DETECTED Final   Haemophilus influenzae NOT DETECTED NOT DETECTED Final   Neisseria meningitidis NOT DETECTED NOT DETECTED Final   Pseudomonas aeruginosa NOT DETECTED NOT DETECTED Final   Stenotrophomonas maltophilia NOT DETECTED NOT DETECTED Final   Candida albicans NOT DETECTED NOT DETECTED Final   Candida auris NOT DETECTED NOT DETECTED Final   Candida glabrata NOT DETECTED NOT DETECTED Final   Candida krusei NOT DETECTED NOT DETECTED Final   Candida parapsilosis NOT DETECTED NOT DETECTED Final   Candida tropicalis NOT DETECTED NOT DETECTED Final   Cryptococcus neoformans/gattii NOT DETECTED NOT DETECTED Final   Meth resistant mecA/C and MREJ DETECTED (A) NOT DETECTED Final    Comment: CRITICAL RESULT  CALLED TO, READ BACK BY AND VERIFIED WITH: SCOTT HALL $RemoveBe'@0250'[id number]$  08/26/20 RH Performed at [facility], [address] [place], [place] [address]   Surgical PCR screen     Status: Abnormal   Collection Time: 08/26/20  5:27 AM   Specimen: Nasal Mucosa; Nasal Swab  Result Value Ref Range Status   MRSA, PCR POSITIVE (A) NEGATIVE Final    Comment: RESULT CALLED TO, READ BACK BY AND VERIFIED WITH: CANDACE SUMMERS AT 7711 08/26/20 SDR    Staphylococcus aureus POSITIVE (A) NEGATIVE Final    Comment: (NOTE) The Xpert  SA Assay (FDA approved for NASAL specimens in patients 65 years of age and older), is one component of a comprehensive surveillance program. It is not intended to diagnose infection nor to guide or monitor treatment. Performed at [facility], [address], [place], [place] [address]   Anaerobic culture     Status: None   Collection Time: 08/26/20  5:27 PM   Specimen: PATH Bone biopsy; Tissue  Result Value Ref Range Status   Specimen Description   Final    BONE R DISTAL PHALANX BONE CULTURE Performed at [facility], [address], [place], [place] [address]    Special Requests   Final    NONE Performed at [facility], [address] [place], [place] [address]    Gram Stain   Final    MODERATE WBC PRESENT, PREDOMINANTLY PMN ABUNDANT GRAM POSITIVE COCCI    Culture   Final    ABUNDANT METHICILLIN RESISTANT STAPHYLOCOCCUS AUREUS NO ANAEROBES ISOLATED Performed at [facility], [place] [address] [place], [place] [address]    Report Status 08/31/2020 FINAL  Final   Organism ID, Bacteria METHICILLIN RESISTANT STAPHYLOCOCCUS AUREUS  Final      Susceptibility   Methicillin resistant staphylococcus aureus - MIC*    CIPROFLOXACIN <=0.5 SENSITIVE Sensitive     ERYTHROMYCIN <=0.25 SENSITIVE Sensitive     GENTAMICIN <=0.5 SENSITIVE Sensitive     OXACILLIN >=4 RESISTANT Resistant     TETRACYCLINE <=1 SENSITIVE Sensitive      VANCOMYCIN 1 SENSITIVE Sensitive     TRIMETH/SULFA <=10 SENSITIVE Sensitive     CLINDAMYCIN <=0.25 SENSITIVE Sensitive     RIFAMPIN <=0.5 SENSITIVE Sensitive     Inducible Clindamycin NEGATIVE Sensitive     * ABUNDANT METHICILLIN RESISTANT STAPHYLOCOCCUS AUREUS  CULTURE, BLOOD (ROUTINE X 2) w Reflex to ID Panel     Status: None   Collection Time: 08/28/20 11:36 AM   Specimen: BLOOD  Result Value Ref Range Status   Specimen Description BLOOD RIGHT [facility]  Final   Special Requests   Final    BOTTLES DRAWN AEROBIC AND ANAEROBIC Blood Culture adequate volume   Culture   Final    NO GROWTH 5 DAYS Performed at [facility], [address] [place], [place] [address]    Report Status 09/02/2020 FINAL  Final  CULTURE, BLOOD (ROUTINE X 2) w Reflex to ID Panel     Status: None   Collection Time: 08/28/20 11:42 AM   Specimen: BLOOD  Result Value Ref Range Status   Specimen Description BLOOD RIGHT HAND  Final   Special Requests   Final    BOTTLES DRAWN AEROBIC AND ANAEROBIC Blood Culture adequate volume   Culture   Final    NO GROWTH 5 DAYS Performed at [facility], [address], [place], [place] [address]    Report Status 09/02/2020 FINAL  Final    Coagulation Studies: No results for input(s): LABPROT, INR in the last 72 hours.  Urinalysis: No results for input(s): COLORURINE, LABSPEC, PHURINE, GLUCOSEU, HGBUR, BILIRUBINUR, KETONESUR, PROTEINUR, UROBILINOGEN, NITRITE, LEUKOCYTESUR in the last 72 hours.  Invalid input(s): APPERANCEUR    Imaging: CT ABDOMEN PELVIS WO CONTRAST  Result Date: 09/24/2020 CLINICAL DATA:  Abdominal pain. EXAM: CT ABDOMEN AND PELVIS WITHOUT CONTRAST TECHNIQUE: Multidetector CT imaging of the abdomen and pelvis was performed following the standard protocol without IV contrast. COMPARISON:  September 10, 1999 FINDINGS: Lower chest: There are small bilateral pleural effusions.There is cardiomegaly. The  intracardiac blood pool is hypodense  relative to the adjacent myocardium consistent with anemia. There is scattered hazy airspace opacities at the lung bases bilaterally. Hepatobiliary: The liver is normal. There are stones versus gallbladder sludge.There is no biliary ductal dilation. Pancreas: Normal contours without ductal dilatation. No peripancreatic fluid collection. Spleen: Unremarkable. Adrenals/Urinary Tract: --Adrenal glands: Unremarkable. --Right kidney/ureter: No hydronephrosis or radiopaque kidney stones. --Left kidney/ureter: There is haziness and fat stranding about the left kidney which appears to be new since prior study. There is no hydronephrosis. There is an ill-defined masslike exophytic area arising from the left kidney measuring approximately 3 cm and 20 Hounsfield units. This likely represents a cyst seen on the patient's recent ultrasound. --Urinary bladder: Unremarkable. Stomach/Bowel: --Stomach/Duodenum: No hiatal hernia or other gastric abnormality. Normal duodenal course and caliber. --Small bowel: Unremarkable. --Colon: Unremarkable. --Appendix: Normal. Vascular/Lymphatic: Atherosclerotic calcification is present within the non-aneurysmal abdominal aorta, without hemodynamically significant stenosis. --there is mild retroperitoneal adenopathy. --No mesenteric lymphadenopathy. --there is pelvic adenopathy with symmetric pelvic sidewall lymph nodes measuring up to approximately 1.5 cm. Reproductive: Unremarkable Other: There is a trace amount of free fluid in the abdomen. No free air. There are bilateral fat containing inguinal hernias. Musculoskeletal. No acute displaced fractures. IMPRESSION: 1. Diffuse hazy bilateral airspace opacities are noted at the lung bases with new bilateral pleural effusions and interlobular septal thickening. Findings may be secondary to developing pulmonary edema versus an atypical infectious process. 2. New fat stranding about the left kidney is nonspecific and may be secondary to  pyelonephritis. Correlation with laboratory studies is recommended. 3. Mild retroperitoneal and pelvic adenopathy of unknown clinical significance. 4. Cardiomegaly. 5. Cholelithiasis without secondary signs of acute cholecystitis. 6. Trace amount of free fluid in the abdomen. Aortic Atherosclerosis (ICD10-I70.0). Electronically Signed   By: Constance Holster M.D.   On: 09/24/2020 05:45     Medications:   . sodium chloride    . sodium chloride     . amLODipine  10 mg Oral Daily  . Chlorhexidine Gluconate Cloth  6 each Topical Q0600  . famotidine  20 mg Oral Daily  . hydrALAZINE  25 mg Oral Q8H  . insulin aspart  0-9 Units Subcutaneous Q4H  . lisinopril  20 mg Oral Daily  . metoprolol tartrate  50 mg Oral BID  . pantoprazole (PROTONIX) IV  40 mg Intravenous Q12H   sodium chloride, sodium chloride, acetaminophen **OR** acetaminophen, alteplase, benzonatate, heparin, HYDROcodone-acetaminophen, lidocaine (PF), lidocaine-prilocaine, ondansetron **OR** ondansetron (ZOFRAN) IV, pentafluoroprop-tetrafluoroeth  Assessment/ Plan:  Steve Vance is a 60 y.o. black male with end stage renal disease on hemodialysis, hypertension, sleep apnea, diabetes mellitus type II, chronic back pain, right toe amputation who is admitted to [facility] on 09/23/2020 for Melena [K92.1] Hypertensive urgency [I16.0] Fluid overload [E87.70] Symptomatic anemia [D64.9] Dyspnea, unspecified type [R06.00] Congestive heart failure, unspecified HF chronicity, unspecified heart failure type (Cool) [I50.9]  [facility] TTS [place] Left AVF 115.5kg  1. End stage renal disease:  Outpatient cohort COVID-19 shift at [facility] TTS second shift. -  We are planning for hemodialysis session today as we are performing dialysis on COVID-positive patients on TTS.  2. Hypertension: Patient now restarted on amlodipine, lisinopril, and metoprolol.  3. Anemia with chronic kidney disease:   Lab Results  Component Value Date   HGB  7.8 (L) 09/25/2020    -Start Epogen with next dialysis session.  4. Secondary Hyperparathyroidism: Phosphorus 6.1.  Start the patient on calcium acetate 2 tablets p.o. 3 times daily  with meals.   LOS: 1 Levii Vance 1/20/20225:51 PM

## 2020-09-25 NOTE — Transfer of Care (Signed)
Immediate Anesthesia Transfer of Care Note  Patient: Steve Vance  Procedure(s) Performed: ESOPHAGOGASTRODUODENOSCOPY (EGD) (N/A )  Patient Location: Endoscopy Unit  Anesthesia Type:General  Level of Consciousness: drowsy  Airway & Oxygen Therapy: Patient Spontanous Breathing and Patient connected to nasal cannula oxygen  Post-op Assessment: Report given to RN and Post -op Vital signs reviewed and stable  Post vital signs: Reviewed and stable  Last Vitals:  Vitals Value Taken Time  BP 127/94 09/25/20 1628  Temp    Pulse 89 09/25/20 1628  Resp 19 09/25/20 1628  SpO2 98 % 09/25/20 1628  Vitals shown include unvalidated device data.  Last Pain:  Vitals:   09/25/20 1624  TempSrc: (P) Temporal  PainSc:       Patients Stated Pain Goal: 0 (07/02/24 3664)  Complications: No complications documented.

## 2020-09-25 NOTE — Op Note (Signed)
[facility] Patient Name: Steve Vance Procedure Date: 09/25/2020 1:26 PM MRN: [id number] Account #: [id number] Date of Birth: October 10, 1960 Admit Type: Inpatient Age: 60 Room: [facility] ENDO ROOM 3 Gender: Male Note Status: Finalized Procedure:             Upper GI endoscopy Indications:           Melena Providers:             Andrey Farmer MD, MD Referring MD:          Elyse Jarvis Revelo (Referring MD) Medicines:             Monitored Anesthesia Care Complications:         No immediate complications. Estimated blood loss:                         Minimal. Procedure:             Pre-Anesthesia Assessment:                        - Prior to the procedure, a History and Physical was                         performed, and patient medications and allergies were                         reviewed. The patient is competent. The risks and                         benefits of the procedure and the sedation options and                         risks were discussed with the patient. All questions                         were answered and informed consent was obtained.                         Patient identification and proposed procedure were                         verified by the physician, the nurse, the anesthetist                         and the technician in the endoscopy suite. Mental                         Status Examination: alert and oriented. Airway                         Examination: normal oropharyngeal airway and neck                         mobility. Respiratory Examination: clear to                         auscultation. CV Examination: normal. Prophylactic                         Antibiotics: The  patient does not require prophylactic                         antibiotics. Prior Anticoagulants: The patient has                         taken no previous anticoagulant or antiplatelet                         agents. ASA Grade Assessment: III - A  patient with                         severe systemic disease. After reviewing the risks and                         benefits, the patient was deemed in satisfactory                         condition to undergo the procedure. The anesthesia                         plan was to use monitored anesthesia care (MAC).                         Immediately prior to administration of medications,                         the patient was re-assessed for adequacy to receive                         sedatives. The heart rate, respiratory rate, oxygen                         saturations, blood pressure, adequacy of pulmonary                         ventilation, and response to care were monitored                         throughout the procedure. The physical status of the                         patient was re-assessed after the procedure.                        After obtaining informed consent, the endoscope was                         passed under direct vision. Throughout the procedure,                         the patient's blood pressure, pulse, and oxygen                         saturations were monitored continuously. The Endoscope                         was introduced through the mouth, and advanced to the  second part of duodenum. The upper GI endoscopy was                         accomplished without difficulty. The patient tolerated                         the procedure well. Findings:      A small hiatal hernia was present.      Two tongues of salmon-colored mucosa were present. No other visible       abnormalities were present. The maximum longitudinal extent of these       esophageal mucosal changes was 1 cm in length. Biopsies were taken with       a cold forceps for histology. Mucosa was biopsied with a cold forceps       for histology in a targeted manner at intervals of 2 cm in the lower       third of the esophagus. One specimen bottle was sent to pathology.        Estimated blood loss was minimal.      A single 13 mm papule (nodule) with no bleeding and no stigmata of       recent bleeding was found on the lesser curvature of the stomach.       Biopsies were taken with a cold forceps for histology. Estimated blood       loss was minimal.      Localized mildly erythematous mucosa without bleeding was found in the       gastric antrum. Biopsies were taken with a cold forceps for Helicobacter       pylori testing. Estimated blood loss was minimal.      Diffuse moderately erythematous mucosa without active bleeding and with       no stigmata of bleeding was found in the duodenal bulb.      A single 3 mm sessile polyp was found in the second portion of the       duodenum. Biopsies were taken with a cold forceps for histology.       Estimated blood loss was minimal.      The exam of the duodenum was otherwise normal. Impression:            - Small hiatal hernia.                        - Salmon-colored mucosa suspicious for short-segment                         Barrett's esophagus and classified as Barrett's stage                         C0-M1 per [place] criteria. Biopsied.                        - A single papule (nodule) found in the stomach.                         Biopsied.                        - Erythematous mucosa in the antrum. Biopsied.                        -  Erythematous duodenopathy.                        - A single duodenal polyp. Biopsied. Recommendation:        - Return patient to hospital ward for ongoing care.                        - Resume previous diet.                        - Continue present medications.                        - Await pathology results.                        - Perform a colonoscopy at appointment to be scheduled                         as an outpatient. Procedure Code(s):     --- Professional ---                        [phone], Esophagogastroduodenoscopy, flexible,                         transoral; with  biopsy, single or multiple Diagnosis Code(s):     --- Professional ---                        K22.70, Barrett's esophagus without dysplasia                        K31.89, Other diseases of stomach and duodenum                        K31.7, Polyp of stomach and duodenum                        K92.1, Melena (includes Hematochezia) CPT copyright 2019 American Medical Association. All rights reserved. The codes documented in this report are preliminary and upon coder review may  be revised to meet current compliance requirements. Andrey Farmer MD, MD 09/25/2020 4:17:50 PM Number of Addenda: 0 Note Initiated On: 09/25/2020 1:26 PM Estimated Blood Loss:  Estimated blood loss was minimal.      [facility]

## 2020-09-25 NOTE — Progress Notes (Signed)
Pt arrived from ER.

## 2020-09-25 NOTE — Progress Notes (Signed)
OT Cancellation Note  Patient Details Name: Steve Vance MRN: [id number] DOB: 1961/03/03   Cancelled Treatment:    Reason Eval/Treat Not Completed: Patient at procedure or test/ unavailable. Order received, chart reviewed. Pt is current off floor, receiving HD. Will re-attempt at a later time/date, as pt is available.   Josiah Lobo, PhD, [place], OTR/L ascom [phone] 09/25/20, 1:28 PM

## 2020-09-25 NOTE — ED Notes (Signed)
Patient called out with complaint of "I can't breathe". RN went to room and found patient notably anxious and with oxygen saturation of 98% on 4L [place]. RN encouraged patient to control breathing. Once breathing controlled patient reported feeling better. Spo2 maintained throughout episode. Patient requested oxygen mask be put in place of [place]. RN educated patient that mask not required at this time and pt continues to request. Mask applied and pt informed RN would remove in ~10 minutes. Patient also placed in recliner per request. Cardiac monitor remains in place. Both of patient's cell phones plugged in within pt reach per pt request. Patient personal fan also plugged in per request. Patient R foot also wrapped with gauze per pt request and fresh hospital socks applied. Pt denies further needs at this time.

## 2020-09-25 NOTE — ED Notes (Signed)
Pt to dialysis.
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
## 2020-09-25 NOTE — Anesthesia Postprocedure Evaluation (Signed)
Anesthesia Post Note  Patient: Steve Vance  Procedure(s) Performed: ESOPHAGOGASTRODUODENOSCOPY (EGD) (N/A )  Patient location during evaluation: Endoscopy Anesthesia Type: General Level of consciousness: awake and alert Pain management: pain level controlled Vital Signs Assessment: post-procedure vital signs reviewed and stable Respiratory status: spontaneous breathing and respiratory function stable Cardiovascular status: stable Anesthetic complications: no   No complications documented.   Last Vitals:  Vitals:   09/25/20 1628 09/25/20 1634  BP: (!) 127/94 (!) 155/86  Pulse: 90 90  Resp: 16 (!) 24  Temp:    SpO2: 97% 99%    Last Pain:  Vitals:   09/25/20 1634  TempSrc:   PainSc: 0-No pain                 Sherria Riemann K

## 2020-09-25 NOTE — Anesthesia Preprocedure Evaluation (Signed)
Anesthesia Evaluation  Patient identified by MRN, date of birth, ID band Patient awake    Reviewed: Allergy & Precautions, NPO status , Patient's Chart, lab work & pertinent test results  History of Anesthesia Complications Negative for: history of anesthetic complications  Airway Mallampati: III       Dental   Pulmonary neg sleep apnea, pneumonia, resolved, neg COPD, Not current smoker,           Cardiovascular hypertension, Pt. on medications (-) Past MI and (-) DVT (-) dysrhythmias (-) Valvular Problems/Murmurs     Neuro/Psych neg Seizures Anxiety Depression    GI/Hepatic Neg liver ROS, GERD  Medicated and Controlled,  Endo/Other  diabetes, Type 2, Oral Hypoglycemic Agents  Renal/GU ESRF and DialysisRenal disease     Musculoskeletal   Abdominal   Peds  Hematology  (+) anemia ,   Anesthesia Other Findings   Reproductive/Obstetrics                             Anesthesia Physical Anesthesia Plan  ASA: IV  Anesthesia Plan: General   Post-op Pain Management:    Induction: Intravenous  PONV Risk Score and Plan: 2 and Ondansetron and Dexamethasone  Airway Management Planned: Nasal Cannula  Additional Equipment:   Intra-op Plan:   Post-operative Plan:   Informed Consent: I have reviewed the patients History and Physical, chart, labs and discussed the procedure including the risks, benefits and alternatives for the proposed anesthesia with the patient or authorized representative who has indicated his/her understanding and acceptance.       Plan Discussed with:   Anesthesia Plan Comments:         Anesthesia Quick Evaluation

## 2020-09-25 NOTE — Progress Notes (Signed)
PT Cancellation Note  Patient Details Name: Steve Vance MRN: [id number] DOB: [birth year]   Cancelled Treatment:    Reason Eval/Treat Not Completed: Patient at procedure or test/unavailable (Consult received and chart reviewed. Patient currently off unit for dialysis. Will re-attempt at later time/date as medically appropriate and available.)   Azriella Mattia H. Owens Shark, PT, DPT, NCS 09/25/20, 10:05 AM [phone]

## 2020-09-25 NOTE — Progress Notes (Signed)
PROGRESS NOTE    Steve Vance  ZOX:[id number] DOB: 11/07/1960 DOA: 09/23/2020 PCP: Theotis Burrow, MD      Brief Narrative:  Steve Vance is a 60 y.o. male with medical history significant for ESRD on HD TTS, anemia of CKD, baseline 8-11, HTN, DM, MRSA bacteremia on vancomycin at hemodialysis until 1/20, recently hospitalized from 1/4-1/7 for respiratory failure related to severe COVID and missed dialysis, who presents to the emergency room with melena stool x1 day and shortness of breath x2 days.        Assessment & Plan:  Acute likely upper GI bleed Acute blood loss anemia Patient admitted and started on IV PPI, GI consulted.  Melena stopped  EGD tonight showed gastropathy, no stigmata of recent bleeding.  -Continue PPI PO -Repeat CBC tomorrow -Outpatient colonoscopy  -Hold aspirin   Acute dyspnea Due to anemia and fluid overload and COVID.  Improving.  Still on O2 today.    Hypertensive urgency Metoprolol, amlodipine and lisinopril held today - Give home meds  - Continue amlodipine, lisinopril, Hydralazine, metoprolol  Fluid overload Missed dialysis in patient with ESRD on dialysis TTS Improved with dialysis today -Consult Nephrology, appreciate cares  Type 2 diabetes mellitus with hyperlipidemia (HCC) Glucose good -Continue SS corrections     MRSA bacteremia - COntinue IV Vancomycin, last dose today      Disposition: Status is: Inpatient  Remains inpatient appropriate because:IV treatments appropriate due to intensity of illness or inability to take PO   Dispo: The patient is from: SNF              Anticipated d/c is to: SNF              Anticipated d/c date is: 1 day              Patient currently is not medically stable to d/c.              MDM: The below labs and imaging reports reviewed and summarized above.  Medication management as above.    DVT prophylaxis: SCDs Start: 09/24/20 0024  Code Status: FULL Family  Communication:     Consultants:   Nephrology  GI  Procedures:   1/19 CT abdomen and pelvis -- kidney stranding, pneumonia, no other acute findings  Antimicrobials:   Vancomycin to end 1/20   Culture data:   None           Subjective: Patient is weak and tired.  He has some generalized malaise and is still very out of breath.  His orthopnea has improved.  His shortness of breath overall somewhat better.  He still dyspneic with exertion.  He has no confusion, fever, focal weakness or pain.        Objective: Vitals:   09/25/20 1647 09/25/20 1654 09/25/20 1701 09/25/20 1733  BP: (!) 243/89 (!) 251/63 (!) 187/97 (!) 200/112  Pulse: 96 97 94 96  Resp: (!) $RemoveB'21 19 15 20  'pdqBxSmW$ Temp:    97.8 F (36.6 C)  TempSrc:    Oral  SpO2: 95% 90% 97% 100%  Weight:      Height:        Intake/Output Summary (Last 24 hours) at 09/25/2020 1745 Last data filed at 09/25/2020 1618 Gross per 24 hour  Intake 500 ml  Output 3780 ml  Net -3280 ml   Filed Weights   09/23/20 0929  Weight: 108.9 kg    Examination: General appearance: Obese adult male, sitting up in bed, on  the edge of the bed, interactive.     HEENT: Anicteric, conjunctival pink, lids and lashes normal.  No nasal deformity, discharge, or epistaxis.  Nasal cannula in place, lips normal, edentulous, oropharynx moist, no oral lesions Skin: No suspicious rashes or lesions Cardiac: Tachycardic, regular, no systolic murmurs, no lower extremity edema Respiratory: Normal respiratory rate and rhythm, lungs clear without rales or wheezes Abdomen: Abdomen soft without tenderness palpation or guarding MSK: Normal muscle bulk and tone Neuro: Awake and alert, extraocular movements intact, moves upper extremities with generalized weakness but symmetric strength, speech fluent. Psych: Attention normal, affect normal, judgment insight appear normal      Data Reviewed: I have personally reviewed following labs and imaging  studies:  CBC: Recent Labs  Lab 09/23/20 1010 09/24/20 0033 09/24/20 1647 09/25/20 0408  WBC 9.1  --   --  8.7  HGB 6.5* 7.2* 7.8* 7.8*  HCT 20.7* 22.7* 23.5* 24.1*  MCV 87.0  --   --  89.3  PLT 207  --   --  098   Basic Metabolic Panel: Recent Labs  Lab 09/23/20 1010 09/24/20 1647 09/25/20 0408 09/25/20 1050  NA 133*  --  136  --   K 4.5  --  4.3  --   CL 94*  --  99  --   CO2 24  --  24  --   GLUCOSE 97  --  90  --   BUN 62*  --  43*  --   CREATININE 11.52*  --  9.11*  --   CALCIUM 8.8*  --  8.7*  --   PHOS  --  6.0* 5.6* 6.1*   GFR: Estimated Creatinine Clearance: 10.7 mL/min (A) (by C-G formula based on SCr of 9.11 mg/dL (H)). Liver Function Tests: Recent Labs  Lab 09/23/20 1010 09/25/20 0408  AST 20  --   ALT 13  --   ALKPHOS 48  --   BILITOT 1.3*  --   PROT 7.8  --   ALBUMIN 3.2* 2.9*   Recent Labs  Lab 09/23/20 1010  LIPASE 31   No results for input(s): AMMONIA in the last 168 hours. Coagulation Profile: No results for input(s): INR, PROTIME in the last 168 hours. Cardiac Enzymes: No results for input(s): CKTOTAL, CKMB, CKMBINDEX, TROPONINI in the last 168 hours. BNP (last 3 results) No results for input(s): PROBNP in the last 8760 hours. HbA1C: No results for input(s): HGBA1C in the last 72 hours. CBG: Recent Labs  Lab 09/24/20 1300 09/24/20 2358 09/25/20 0433 09/25/20 0736 09/25/20 1728  GLUCAP 90 93 90 80 73   Lipid Profile: No results for input(s): CHOL, HDL, LDLCALC, TRIG, CHOLHDL, LDLDIRECT in the last 72 hours. Thyroid Function Tests: No results for input(s): TSH, T4TOTAL, FREET4, T3FREE, THYROIDAB in the last 72 hours. Anemia Panel: No results for input(s): VITAMINB12, FOLATE, FERRITIN, TIBC, IRON, RETICCTPCT in the last 72 hours. Urine analysis:    Component Value Date/Time   COLORURINE AMBER (A) 08/25/2020 1335   APPEARANCEUR HAZY (A) 08/25/2020 1335   APPEARANCEUR Cloudy (A) 07/30/2019 1301   LABSPEC 1.009 08/25/2020  1335   PHURINE 8.0 08/25/2020 1335   GLUCOSEU 50 (A) 08/25/2020 1335   HGBUR LARGE (A) 08/25/2020 1335   BILIRUBINUR NEGATIVE 08/25/2020 1335   BILIRUBINUR Negative 07/30/2019 1301   KETONESUR NEGATIVE 08/25/2020 1335   PROTEINUR >=300 (A) 08/25/2020 1335   NITRITE NEGATIVE 08/25/2020 1335   LEUKOCYTESUR TRACE (A) 08/25/2020 1335   Sepsis Labs: $RemoveBefo'@[email or web address]'[id number]$ (procalcitonin:4,lacticacidven:4)  )  No results found for this or any previous visit (from the past 240 hour(s)).       Radiology Studies: CT ABDOMEN PELVIS WO CONTRAST  Result Date: 09/24/2020 CLINICAL DATA:  Abdominal pain. EXAM: CT ABDOMEN AND PELVIS WITHOUT CONTRAST TECHNIQUE: Multidetector CT imaging of the abdomen and pelvis was performed following the standard protocol without IV contrast. COMPARISON:  September 10, 1999 FINDINGS: Lower chest: There are small bilateral pleural effusions.There is cardiomegaly. The intracardiac blood pool is hypodense relative to the adjacent myocardium consistent with anemia. There is scattered hazy airspace opacities at the lung bases bilaterally. Hepatobiliary: The liver is normal. There are stones versus gallbladder sludge.There is no biliary ductal dilation. Pancreas: Normal contours without ductal dilatation. No peripancreatic fluid collection. Spleen: Unremarkable. Adrenals/Urinary Tract: --Adrenal glands: Unremarkable. --Right kidney/ureter: No hydronephrosis or radiopaque kidney stones. --Left kidney/ureter: There is haziness and fat stranding about the left kidney which appears to be new since prior study. There is no hydronephrosis. There is an ill-defined masslike exophytic area arising from the left kidney measuring approximately 3 cm and 20 Hounsfield units. This likely represents a cyst seen on the patient's recent ultrasound. --Urinary bladder: Unremarkable. Stomach/Bowel: --Stomach/Duodenum: No hiatal hernia or other gastric abnormality. Normal duodenal course and caliber. --Small bowel:  Unremarkable. --Colon: Unremarkable. --Appendix: Normal. Vascular/Lymphatic: Atherosclerotic calcification is present within the non-aneurysmal abdominal aorta, without hemodynamically significant stenosis. --there is mild retroperitoneal adenopathy. --No mesenteric lymphadenopathy. --there is pelvic adenopathy with symmetric pelvic sidewall lymph nodes measuring up to approximately 1.5 cm. Reproductive: Unremarkable Other: There is a trace amount of free fluid in the abdomen. No free air. There are bilateral fat containing inguinal hernias. Musculoskeletal. No acute displaced fractures. IMPRESSION: 1. Diffuse hazy bilateral airspace opacities are noted at the lung bases with new bilateral pleural effusions and interlobular septal thickening. Findings may be secondary to developing pulmonary edema versus an atypical infectious process. 2. New fat stranding about the left kidney is nonspecific and may be secondary to pyelonephritis. Correlation with laboratory studies is recommended. 3. Mild retroperitoneal and pelvic adenopathy of unknown clinical significance. 4. Cardiomegaly. 5. Cholelithiasis without secondary signs of acute cholecystitis. 6. Trace amount of free fluid in the abdomen. Aortic Atherosclerosis (ICD10-I70.0). Electronically Signed   By: Constance Holster M.D.   On: 09/24/2020 05:45        Scheduled Meds: . amLODipine  10 mg Oral Daily  . Chlorhexidine Gluconate Cloth  6 each Topical Q0600  . famotidine  20 mg Oral Daily  . hydrALAZINE  25 mg Oral Q8H  . insulin aspart  0-9 Units Subcutaneous Q4H  . lisinopril  20 mg Oral Daily  . metoprolol tartrate  50 mg Oral BID  . pantoprazole (PROTONIX) IV  40 mg Intravenous Q12H   Continuous Infusions: . sodium chloride    . sodium chloride       LOS: 1 day    Time spent: 25 minutes    Edwin Dada, MD [facility] 09/25/2020, 5:45 PM     Please page though [facility] or Epic secure chat:  For password, contact  charge nurse

## 2020-09-26 ENCOUNTER — Encounter: Payer: Self-pay | Admitting: Gastroenterology

## 2020-09-26 LAB — GLUCOSE, CAPILLARY
Glucose-Capillary: 113 mg/dL — ABNORMAL HIGH (ref 70–99)
Glucose-Capillary: 73 mg/dL (ref 70–99)
Glucose-Capillary: 83 mg/dL (ref 70–99)
Glucose-Capillary: 91 mg/dL (ref 70–99)

## 2020-09-26 MED ORDER — LISINOPRIL 20 MG PO TABS: 20.0000 mg | ORAL_TABLET | Freq: Every day | ORAL | 3 refills | Status: DC

## 2020-09-26 MED ORDER — AMLODIPINE BESYLATE 10 MG PO TABS: 10.0000 mg | ORAL_TABLET | Freq: Every day | ORAL | 3 refills | Status: DC

## 2020-09-26 MED ORDER — ALBUTEROL SULFATE HFA 108 (90 BASE) MCG/ACT IN AERS: 1.0000 | INHALATION_SPRAY | RESPIRATORY_TRACT | 3 refills | Status: DC | PRN

## 2020-09-26 MED ORDER — METOPROLOL TARTRATE 25 MG PO TABS: 50.0000 mg | ORAL_TABLET | Freq: Two times a day (BID) | ORAL | 3 refills | Status: DC

## 2020-09-26 MED ORDER — BENZONATATE 100 MG PO CAPS: 100.0000 mg | ORAL_CAPSULE | Freq: Three times a day (TID) | ORAL | 0 refills | Status: DC | PRN

## 2020-09-26 MED ORDER — FAMOTIDINE 20 MG PO TABS: 20.0000 mg | ORAL_TABLET | Freq: Every day | ORAL | 3 refills | Status: DC

## 2020-09-26 MED ORDER — HYDRALAZINE HCL 25 MG PO TABS: 25.0000 mg | ORAL_TABLET | Freq: Three times a day (TID) | ORAL | 3 refills | Status: DC

## 2020-09-26 MED ORDER — ASPIRIN 81 MG PO TBEC: 81.0000 mg | DELAYED_RELEASE_TABLET | Freq: Every day | ORAL | 3 refills | Status: DC

## 2020-09-26 NOTE — Discharge Summary (Signed)
Physician Discharge Summary  Steve Vance QMG:[id number] DOB: 18-Jul-1961 DOA: 09/23/2020  PCP: Steve Burrow, MD  Admit date: 09/23/2020 Discharge date: 09/26/2020  Admitted From: Home  Disposition:  Home   Recommendations for Outpatient Follow-up:  1. Follow up with PCP Dr. Ladoris Vance in 2 weeks 2. Follow-up gastroenterology in 4 weeks      Home Health: None  Equipment/Devices: None new  Discharge Condition: Fair  CODE STATUS: FULL Diet recommendation: Renal  Brief/Interim Summary: Steve Vance a 60 y.o.malewith medical history significant forESRD on HD TTS, anemia of CKD, baseline 8-11, HTN, DM, MRSA bacteremia on vancomycin at hemodialysis until 1/20, recently hospitalized from 1/4-1/7 for respiratory failure related to severe COVID and missed dialysis, who presents to the emergency room with melena stool x1 day and shortness of breath x2 days.      PRINCIPAL HOSPITAL DIAGNOSIS: Acute GI bleed    Discharge Diagnoses:   Acute likely upper GI bleed Acute blood loss anemia Patient admitted and started on IV PPI, GI consulted.  Melena stopped  EGD showed gastropathy, no stigmata of recent bleeding.  -Continue PPI  -Outpatient colonoscopy     Acute dyspnea Acute hypoxic respiratory failure  Hypertensive urgency BP resolved with amlodipine, lisinopril, Hydralazine, metoprolol  Fluid overload Missed dialysisinpatient with ESRD on dialysis TTS Respiratory status resolved with dialysis.  COVID-19 Patient recently treated for severe COVID-19, now weaned off O2, respiratory status improved, and his dyspnea was most likely due to anemia, fluid overload, and less likely persistent COVID pneumonitis  Type 2 diabetes mellitus with hyperlipidemia (HCC)   MRSA bacteremia Patient completed IV vancomycin while in the hospital.             Discharge Instructions  Discharge Instructions    Discharge instructions   Complete by: As  directed    You were admitted for fluid overload and intestinal bleeding (bleeding from the stomach)  You were treated with dialysis to fix the fluid overload and had an endoscopy to examine your stomach.  Thankfully, the endoscopy (in your stomach) was normal  You should take famoditine (an anti acid medicine) 20 mg daily  You should take your blood pressure medicines, lisinopril, metoprol, and amlodipine  Take amlodipine and lisinopril once daily in the morning Take metoprolol twice daily (once in the morning with the other blood pressure medicines, and once at night before bed) Take hydralazine three times daily (once in the morning with amlodipine, lisinopril and metorpolol, once at lunch, and once at night with the second metoprolol dose)   Increase activity slowly   Complete by: As directed    No wound care   Complete by: As directed      Allergies as of 09/26/2020      Reactions   Baclofen Nausea And Vomiting   Gabapentin Nausea And Vomiting      Medication List    STOP taking these medications   vancomycin  IVPB     TAKE these medications   Accu-Chek FastClix Lancets Misc USE TO CHECK BLOOD SUGAR UP TO 4 TIMES DAILY AS DIRECTED   albuterol 108 (90 Base) MCG/ACT inhaler Commonly known as: VENTOLIN HFA Inhale 1-2 puffs into the lungs every 4 (four) hours as needed for wheezing or shortness of breath.   amLODipine 10 MG tablet Commonly known as: NORVASC Take 1 tablet (10 mg total) by mouth daily.   aspirin 81 MG EC tablet Take 1 tablet (81 mg total) by mouth daily.   benzonatate 100 MG capsule Commonly  known as: TESSALON Take 1 capsule (100 mg total) by mouth 3 (three) times daily as needed for cough.   blood glucose meter kit and supplies Kit Dispense based on patient and insurance preference. Use up to four times daily as directed. (FOR ICD-9 250.00, 250.01).   famotidine 20 MG tablet Commonly known as: PEPCID Take 1 tablet (20 mg total) by mouth  daily.   hydrALAZINE 25 MG tablet Commonly known as: APRESOLINE Take 1 tablet (25 mg total) by mouth every 8 (eight) hours.   HYDROcodone-acetaminophen 5-325 MG tablet Commonly known as: Norco Take 1 tablet by mouth every 6 (six) hours as needed for severe pain.   lisinopril 20 MG tablet Commonly known as: ZESTRIL Take 1 tablet (20 mg total) by mouth daily.   metoprolol tartrate 25 MG tablet Commonly known as: LOPRESSOR Take 2 tablets (50 mg total) by mouth 2 (two) times daily.       Follow-up Information    Vance, Steve Jarvis, MD. Schedule an appointment as soon as possible for a visit in 1 week(s).   Specialty: Family Medicine Contact information: [address] [place]  [address] [phone]              Allergies  Allergen Reactions  . Baclofen Nausea And Vomiting  . Gabapentin Nausea And Vomiting       Procedures/Studies: CT ABDOMEN PELVIS WO CONTRAST  Result Date: 09/24/2020 CLINICAL DATA:  Abdominal pain. EXAM: CT ABDOMEN AND PELVIS WITHOUT CONTRAST TECHNIQUE: Multidetector CT imaging of the abdomen and pelvis was performed following the standard protocol without IV contrast. COMPARISON:  September 10, 1999 FINDINGS: Lower chest: There are small bilateral pleural effusions.There is cardiomegaly. The intracardiac blood pool is hypodense relative to the adjacent myocardium consistent with anemia. There is scattered hazy airspace opacities at the lung bases bilaterally. Hepatobiliary: The liver is normal. There are stones versus gallbladder sludge.There is no biliary ductal dilation. Pancreas: Normal contours without ductal dilatation. No peripancreatic fluid collection. Spleen: Unremarkable. Adrenals/Urinary Tract: --Adrenal glands: Unremarkable. --Right kidney/ureter: No hydronephrosis or radiopaque kidney stones. --Left kidney/ureter: There is haziness and fat stranding about the left kidney which appears to be new since prior study. There is no  hydronephrosis. There is an ill-defined masslike exophytic area arising from the left kidney measuring approximately 3 cm and 20 Hounsfield units. This likely represents a cyst seen on the patient's recent ultrasound. --Urinary bladder: Unremarkable. Stomach/Bowel: --Stomach/Duodenum: No hiatal hernia or other gastric abnormality. Normal duodenal course and caliber. --Small bowel: Unremarkable. --Colon: Unremarkable. --Appendix: Normal. Vascular/Lymphatic: Atherosclerotic calcification is present within the non-aneurysmal abdominal aorta, without hemodynamically significant stenosis. --there is mild retroperitoneal adenopathy. --No mesenteric lymphadenopathy. --there is pelvic adenopathy with symmetric pelvic sidewall lymph nodes measuring up to approximately 1.5 cm. Reproductive: Unremarkable Other: There is a trace amount of free fluid in the abdomen. No free air. There are bilateral fat containing inguinal hernias. Musculoskeletal. No acute displaced fractures. IMPRESSION: 1. Diffuse hazy bilateral airspace opacities are noted at the lung bases with new bilateral pleural effusions and interlobular septal thickening. Findings may be secondary to developing pulmonary edema versus an atypical infectious process. 2. New fat stranding about the left kidney is nonspecific and may be secondary to pyelonephritis. Correlation with laboratory studies is recommended. 3. Mild retroperitoneal and pelvic adenopathy of unknown clinical significance. 4. Cardiomegaly. 5. Cholelithiasis without secondary signs of acute cholecystitis. 6. Trace amount of free fluid in the abdomen. Aortic Atherosclerosis (ICD10-I70.0). Electronically Signed   By: Constance Holster  M.D.   On: 09/24/2020 05:45   DG Chest 2 View  Result Date: 09/23/2020 CLINICAL DATA:  Shortness of breath.  COVID 19 pneumonia. EXAM: CHEST - 2 VIEW COMPARISON:  09/09/2020 FINDINGS: Midline trachea. Cardiomegaly, accentuated by low lung volumes on the frontal AP  radiograph. Pulmonary interstitial prominence. Right greater than left base airspace disease. No pleural effusion or pneumothorax. Left glenoid fixation. IMPRESSION: Cardiomegaly and mild pulmonary interstitial prominence, suspicious for venous congestion. Bibasilar airspace disease is similar. Atelectasis versus multifocal pneumonia. Electronically Signed   By: Abigail Miyamoto M.D.   On: 09/23/2020 09:56   DG Chest 2 View  Result Date: 09/09/2020 CLINICAL DATA:  Shortness of breath EXAM: CHEST - 2 VIEW COMPARISON:  08/25/2020 FINDINGS: Heart size appears mildly enlarged, unchanged. Low lung volumes. Pulmonary vascular congestion with diffuse interstitial prominence. Patchy bibasilar airspace opacities, worsened from prior. Suspect small bilateral pleural effusions. No pneumothorax. IMPRESSION: 1. Findings suggestive of CHF with pulmonary edema. 2. Patchy bibasilar airspace opacities, which may reflect alveolar edema versus pneumonia. Electronically Signed   By: Davina Poke D.O.   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 EXAM: NONINVASIVE PHYSIOLOGIC VASCULAR STUDY OF BILATERAL LOWER EXTREMITIES TECHNIQUE: Evaluation of both lower extremities were performed at rest, including calculation of ankle-brachial indices with single level Doppler, pressure and pulse volume recording. COMPARISON:  None. FINDINGS: Right ABI:  Unable to calculate. Left ABI:  Unable to calculate. Right Lower Extremity:  Normal arterial waveforms at the ankle. Left Lower Extremity:  Normal arterial waveforms at the ankle. > 1.4 Non diagnostic secondary to incompressible vessel calcifications (medial arterial sclerosis of [place]) IMPRESSION: Unable to calculate ankle-brachial indices due to noncompressible vessels the level of the ankle. Ruthann Cancer, MD [facility]  [facility] Radiology Electronically Signed   By: Ruthann Cancer MD   On: 08/28/2020 11:44   ECHO TEE  Result Date: 08/28/2020    TRANSESOPHOGEAL ECHO REPORT   Patient Name:   EXAVIER LINA Date of Exam: 08/28/2020 Medical Rec #:  [id number]     Height:       71.0 in Accession #:    [id number]    Weight:       254.0 lb Date of Birth:  February 18, 1961    BSA:          2.334 m Patient Age:    8 years      BP:           153/76 mmHg Patient Gender: M             HR:           89 bpm. Exam Location:  [facility] Procedure: 2D Echo, Color Doppler and Cardiac Doppler Indications:     Bacteremia 790.7  History:         Patient has prior history of Echocardiogram examinations, most                  recent 03/29/2019. Risk Factors:Hypertension and Diabetes.  Sonographer:     Sherrie Sport RDCS (AE) Referring Phys:  [id number] Arvil Chaco Diagnosing Phys: Kathlyn Sacramento MD PROCEDURE: The transesophogeal probe was passed without difficulty through the esophogus of the patient. Local oropharyngeal anesthetic was provided with Benzocaine spray. Sedation performed by different physician. Image quality was good. The patient's vital signs; including heart rate, blood pressure, and oxygen saturation; remained stable throughout  the procedure. The patient developed no complications during the procedure. IMPRESSIONS  1. Left ventricular ejection fraction, by estimation, is 55 to 60%. The left ventricle has normal function. The left ventricle has no regional wall motion abnormalities. There is mild left ventricular hypertrophy. Left ventricular diastolic function could not be evaluated.  2. Right ventricular systolic function is normal. The right ventricular size is normal.  3. No left atrial/left atrial appendage thrombus was detected.  4. The mitral valve is normal in structure. Trivial mitral valve regurgitation. No evidence of mitral stenosis.  5. The aortic valve is normal in structure. Aortic valve regurgitation is not visualized. No  aortic stenosis is present.  6. Agitated saline contrast bubble study was negative, with no evidence of any interatrial shunt. Conclusion(s)/Recommendation(s): No evidence of vegetation/infective endocarditis on this transesophageal echocardiogram. FINDINGS  Left Ventricle: Left ventricular ejection fraction, by estimation, is 55 to 60%. The left ventricle has normal function. The left ventricle has no regional wall motion abnormalities. The left ventricular internal cavity size was normal in size. There is  mild left ventricular hypertrophy. Left ventricular diastolic function could not be evaluated. Right Ventricle: The right ventricular size is normal. No increase in right ventricular wall thickness. Right ventricular systolic function is normal. Left Atrium: Left atrial size was normal in size. No left atrial/left atrial appendage thrombus was detected. Right Atrium: Right atrial size was normal in size. Pericardium: There is no evidence of pericardial effusion. Mitral Valve: The mitral valve is normal in structure. Trivial mitral valve regurgitation. No evidence of mitral valve stenosis. Tricuspid Valve: The tricuspid valve is normal in structure. Tricuspid valve regurgitation is trivial. No evidence of tricuspid stenosis. Aortic Valve: The aortic valve is normal in structure. Aortic valve regurgitation is not visualized. No aortic stenosis is present. Pulmonic Valve: The pulmonic valve was normal in structure. Pulmonic valve regurgitation is not visualized. No evidence of pulmonic stenosis. Aorta: The aortic root is normal in size and structure. IAS/Shunts: No atrial level shunt detected by color flow Doppler. Agitated saline contrast bubble study was negative, with no evidence of any interatrial shunt. Kathlyn Sacramento MD Electronically signed by Kathlyn Sacramento MD Signature Date/Time: 08/28/2020/10:50:45 AM    Final        Subjective: Patient feeling well.  His breathing is better, no more melena.  No  abdominal pain.  No vomiting.  Discharge Exam: Vitals:   09/26/20 0545 09/26/20 0728  BP:  (!) 170/84  Pulse:    Resp:  16  Temp:  98 F (36.7 C)  SpO2: 98% 100%   Vitals:   09/26/20 0036 09/26/20 0417 09/26/20 0545 09/26/20 0728  BP: (!) 151/73 (!) 153/70  (!) 170/84  Pulse: 75 79    Resp: '16 20  16  ' Temp: 98 F (36.7 C) 98.2 F (36.8 C)  98 F (36.7 C)  TempSrc:    Oral  SpO2: 100% 97% 98% 100%  Weight:      Height:        General: Pt is alert, awake, not in acute distress Cardiovascular: RRR, nl S1-S2, no murmurs appreciated.   No LE edema.   Respiratory: Normal respiratory rate and rhythm.  CTAB without rales or wheezes. Abdominal: Abdomen soft and non-tender.  No distension or HSM.   Neuro/Psych: Strength symmetric in upper and lower extremities.  Judgment and insight appear normal.   The results of significant diagnostics from this hospitalization (including imaging, microbiology, ancillary and laboratory) are listed below for reference.  Microbiology: No results found for this or any previous visit (from the past 240 hour(s)).   Labs: BNP (last 3 results) Recent Labs    09/09/20 1516 09/23/20 1010  BNP 690.8* 834.1*   Basic Metabolic Panel: Recent Labs  Lab 09/23/20 1010 09/24/20 1647 09/25/20 0408 09/25/20 1050  NA 133*  --  136  --   K 4.5  --  4.3  --   CL 94*  --  99  --   CO2 24  --  24  --   GLUCOSE 97  --  90  --   BUN 62*  --  43*  --   CREATININE 11.52*  --  9.11*  --   CALCIUM 8.8*  --  8.7*  --   PHOS  --  6.0* 5.6* 6.1*   Liver Function Tests: Recent Labs  Lab 09/23/20 1010 09/25/20 0408  AST 20  --   ALT 13  --   ALKPHOS 48  --   BILITOT 1.3*  --   PROT 7.8  --   ALBUMIN 3.2* 2.9*   Recent Labs  Lab 09/23/20 1010  LIPASE 31   No results for input(s): AMMONIA in the last 168 hours. CBC: Recent Labs  Lab 09/23/20 1010 09/24/20 0033 09/24/20 1647 09/25/20 0408  WBC 9.1  --   --  8.7  HGB 6.5* 7.2* 7.8* 7.8*   HCT 20.7* 22.7* 23.5* 24.1*  MCV 87.0  --   --  89.3  PLT 207  --   --  216   Cardiac Enzymes: No results for input(s): CKTOTAL, CKMB, CKMBINDEX, TROPONINI in the last 168 hours. BNP: Invalid input(s): POCBNP CBG: Recent Labs  Lab 09/25/20 1728 09/25/20 2031 09/26/20 0040 09/26/20 0416 09/26/20 0807  GLUCAP 73 140* 91 113* 83   D-Dimer No results for input(s): DDIMER in the last 72 hours. Hgb A1c No results for input(s): HGBA1C in the last 72 hours. Lipid Profile No results for input(s): CHOL, HDL, LDLCALC, TRIG, CHOLHDL, LDLDIRECT in the last 72 hours. Thyroid function studies No results for input(s): TSH, T4TOTAL, T3FREE, THYROIDAB in the last 72 hours.  Invalid input(s): FREET3 Anemia work up No results for input(s): VITAMINB12, FOLATE, FERRITIN, TIBC, IRON, RETICCTPCT in the last 72 hours. Urinalysis    Component Value Date/Time   COLORURINE AMBER (A) 08/25/2020 1335   APPEARANCEUR HAZY (A) 08/25/2020 1335   APPEARANCEUR Cloudy (A) 07/30/2019 1301   LABSPEC 1.009 08/25/2020 1335   PHURINE 8.0 08/25/2020 1335   GLUCOSEU 50 (A) 08/25/2020 1335   HGBUR LARGE (A) 08/25/2020 1335   BILIRUBINUR NEGATIVE 08/25/2020 1335   BILIRUBINUR Negative 07/30/2019 1301   KETONESUR NEGATIVE 08/25/2020 1335   PROTEINUR >=300 (A) 08/25/2020 1335   NITRITE NEGATIVE 08/25/2020 1335   LEUKOCYTESUR TRACE (A) 08/25/2020 1335   Sepsis Labs Invalid input(s): PROCALCITONIN,  WBC,  LACTICIDVEN Microbiology No results found for this or any previous visit (from the past 240 hour(s)).   Time coordinating discharge: 25 minutes The [place] controlled substances registry was reviewed for this patient      SIGNED:   Edwin Dada, MD  [facility] 09/26/2020, 11:42 AM

## 2020-09-26 NOTE — Progress Notes (Signed)
[facility]  ROUNDING NOTE   Subjective:   Patient feeling better. Appears to be in good spirits. Underwent dialysis yesterday.   Objective:  Vital signs in last 24 hours:  Temp:  [97 F (36.1 C)-98.3 F (36.8 C)] 98 F (36.7 C) (01/21 0728) Pulse Rate:  [75-123] 79 (01/21 0417) Resp:  [15-24] 16 (01/21 0728) BP: (127-251)/(50-112) 170/84 (01/21 0728) SpO2:  [90 %-100 %] 100 % (01/21 0728)  Weight change:  Filed Weights   09/23/20 0929  Weight: 108.9 kg    Intake/Output: I/O last 3 completed shifts: In: 500 [I.V.:100; IV Piggyback:400] Out: 3780 [Urine:280; Other:3500]   Intake/Output this shift:  No intake/output data recorded.  Physical Exam: General: NAD, laying in bed  Head: Normocephalic, atraumatic. Moist oral mucosal membranes  Eyes: Anicteric  Neck: Supple, trachea midline  Lungs:  Normal effort  Heart: Regular  Abdomen:  Soft, nontender  Extremities: 1+ peripheral edema.  Neurologic: Nonfocal, moving all four extremities  Skin: No lesions  Access: Left AVF    Basic Metabolic Panel: Recent Labs  Lab 09/23/20 1010 09/24/20 1647 09/25/20 0408 09/25/20 1050  NA 133*  --  136  --   K 4.5  --  4.3  --   CL 94*  --  99  --   CO2 24  --  24  --   GLUCOSE 97  --  90  --   BUN 62*  --  43*  --   CREATININE 11.52*  --  9.11*  --   CALCIUM 8.8*  --  8.7*  --   PHOS  --  6.0* 5.6* 6.1*    Liver Function Tests: Recent Labs  Lab 09/23/20 1010 09/25/20 0408  AST 20  --   ALT 13  --   ALKPHOS 48  --   BILITOT 1.3*  --   PROT 7.8  --   ALBUMIN 3.2* 2.9*   Recent Labs  Lab 09/23/20 1010  LIPASE 31   No results for input(s): AMMONIA in the last 168 hours.  CBC: Recent Labs  Lab 09/23/20 1010 09/24/20 0033 09/24/20 1647 09/25/20 0408  WBC 9.1  --   --  8.7  HGB 6.5* 7.2* 7.8* 7.8*  HCT 20.7* 22.7* 23.5* 24.1*  MCV 87.0  --   --  89.3  PLT 207  --   --  216    Cardiac Enzymes: No results for input(s): CKTOTAL, CKMB,  CKMBINDEX, TROPONINI in the last 168 hours.  BNP: Invalid input(s): POCBNP  CBG: Recent Labs  Lab 09/25/20 1728 09/25/20 2031 09/26/20 0040 09/26/20 0416 09/26/20 0807  GLUCAP 73 140* 91 113* 31    Microbiology: Results for orders placed or performed during the hospital encounter of 08/25/20  Resp Panel by RT-PCR (Flu A&B, Covid) Nasopharyngeal Swab     Status: None   Collection Time: 08/25/20 12:29 PM   Specimen: Nasopharyngeal Swab; Nasopharyngeal(NP) swabs in vial transport medium  Result Value Ref Range Status   SARS Coronavirus 2 by RT PCR NEGATIVE NEGATIVE Final    Comment: (NOTE) SARS-CoV-2 target nucleic acids are NOT DETECTED.  The SARS-CoV-2 RNA is generally detectable in upper respiratory specimens during the acute phase of infection. The lowest concentration of SARS-CoV-2 viral copies this assay can detect is 138 copies/mL. A negative result does not preclude SARS-Cov-2 infection and should not be used as the sole basis for treatment or other patient management decisions. A negative result may occur with  improper specimen collection/handling, submission  of specimen other than nasopharyngeal swab, presence of viral mutation(s) within the areas targeted by this assay, and inadequate number of viral copies(<138 copies/mL). A negative result must be combined with clinical observations, patient history, and epidemiological information. The expected result is Negative.  Fact Sheet for Patients:  EntrepreneurPulse.com.au  Fact Sheet for Healthcare Providers:  IncredibleEmployment.be  This test is no t yet approved or cleared by the [place] FDA and  has been authorized for detection and/or diagnosis of SARS-CoV-2 by FDA under an Emergency Use Authorization (EUA). This EUA will remain  in effect (meaning this test can be used) for the duration of the COVID-19 declaration under Section 564(b)(1) of the Act, 21 U.S.C.section  360bbb-3(b)(1), unless the authorization is terminated  or revoked sooner.       Influenza A by PCR NEGATIVE NEGATIVE Final   Influenza B by PCR NEGATIVE NEGATIVE Final    Comment: (NOTE) The Xpert Xpress SARS-CoV-2/FLU/RSV plus assay is intended as an aid in the diagnosis of influenza from Nasopharyngeal swab specimens and should not be used as a sole basis for treatment. Nasal washings and aspirates are unacceptable for Xpert Xpress SARS-CoV-2/FLU/RSV testing.  Fact Sheet for Patients: EntrepreneurPulse.com.au  Fact Sheet for Healthcare Providers: IncredibleEmployment.be  This test is not yet approved or cleared by the [place] FDA and has been authorized for detection and/or diagnosis of SARS-CoV-2 by FDA under an Emergency Use Authorization (EUA). This EUA will remain in effect (meaning this test can be used) for the duration of the COVID-19 declaration under Section 564(b)(1) of the Act, 21 U.S.C. section 360bbb-3(b)(1), unless the authorization is terminated or revoked.  Performed at [facility], [address] [place], [place] [address]   Culture, blood (routine x 2)     Status: Abnormal   Collection Time: 08/25/20  1:35 PM   Specimen: BLOOD  Result Value Ref Range Status   Specimen Description   Final    BLOOD BLOOD RIGHT FOREARM Performed at [facility], [address], [place], [place] [address]    Special Requests   Final    BOTTLES DRAWN AEROBIC AND ANAEROBIC Blood Culture results may not be optimal due to an excessive volume of blood received in culture bottles Performed at [facility], [address], [place], [place] [address]    Culture  Setup Time   Final    GRAM POSITIVE COCCI IN BOTH AEROBIC AND ANAEROBIC BOTTLES Organism ID to follow CRITICAL RESULT CALLED TO, READ BACK BY AND VERIFIED WITH: SCOTT HALL $RemoveBe'@0250'[email or web address]$  08/26/20 RH Performed at [facility], [address] [place],  [address]    Culture METHICILLIN RESISTANT STAPHYLOCOCCUS AUREUS (A)  Final   Report Status 08/28/2020 FINAL  Final   Organism ID, Bacteria METHICILLIN RESISTANT STAPHYLOCOCCUS AUREUS  Final      Susceptibility   Methicillin resistant staphylococcus aureus - MIC*    CIPROFLOXACIN <=0.5 SENSITIVE Sensitive     ERYTHROMYCIN <=0.25 SENSITIVE Sensitive     GENTAMICIN <=0.5 SENSITIVE Sensitive     OXACILLIN >=4 RESISTANT Resistant     TETRACYCLINE <=1 SENSITIVE Sensitive     VANCOMYCIN <=0.5 SENSITIVE Sensitive     TRIMETH/SULFA <=10 SENSITIVE Sensitive     CLINDAMYCIN <=0.25 SENSITIVE Sensitive     RIFAMPIN <=0.5 SENSITIVE Sensitive     Inducible Clindamycin NEGATIVE Sensitive     * METHICILLIN RESISTANT STAPHYLOCOCCUS AUREUS  Culture, blood (routine x 2)     Status: None   Collection Time: 08/25/20  1:35 PM   Specimen: BLOOD  Result Value Ref Range Status   Specimen Description BLOOD BLOOD RIGHT HAND  Final   Special Requests   Final    BOTTLES DRAWN AEROBIC AND ANAEROBIC Blood Culture adequate volume   Culture   Final    NO GROWTH 5 DAYS Performed at [facility], [address] [place], [place] [address]    Report Status 08/30/2020 FINAL  Final  Blood Culture ID Panel (Reflexed)     Status: Abnormal   Collection Time: 08/25/20  1:35 PM  Result Value Ref Range Status   Enterococcus faecalis NOT DETECTED NOT DETECTED Final   Enterococcus Faecium NOT DETECTED NOT DETECTED Final   Listeria monocytogenes NOT DETECTED NOT DETECTED Final   Staphylococcus species DETECTED (A) NOT DETECTED Final    Comment: CRITICAL RESULT CALLED TO, READ BACK BY AND VERIFIED WITH: SCOTT HALL $RemoveBe'@0250'[id number]$  08/26/20 RH    Staphylococcus aureus (BCID) DETECTED (A) NOT DETECTED Final    Comment: Methicillin (oxacillin)-resistant Staphylococcus aureus (MRSA). MRSA is predictably resistant to beta-lactam antibiotics (except ceftaroline). Preferred therapy is vancomycin unless clinically  contraindicated. Patient requires contact precautions if  hospitalized. CRITICAL RESULT CALLED TO, READ BACK BY AND VERIFIED WITH: SCOTT HALL $RemoveBe'@0250'[id number]$  08/26/20 RH    Staphylococcus epidermidis NOT DETECTED NOT DETECTED Final   Staphylococcus lugdunensis NOT DETECTED NOT DETECTED Final   Streptococcus species NOT DETECTED NOT DETECTED Final   Streptococcus agalactiae NOT DETECTED NOT DETECTED Final   Streptococcus pneumoniae NOT DETECTED NOT DETECTED Final   Streptococcus pyogenes NOT DETECTED NOT DETECTED Final   A.calcoaceticus-baumannii NOT DETECTED NOT DETECTED Final   Bacteroides fragilis NOT DETECTED NOT DETECTED Final   Enterobacterales NOT DETECTED NOT DETECTED Final   Enterobacter cloacae complex NOT DETECTED NOT DETECTED Final   Escherichia coli NOT DETECTED NOT DETECTED Final   Klebsiella aerogenes NOT DETECTED NOT DETECTED Final   Klebsiella oxytoca NOT DETECTED NOT DETECTED Final   Klebsiella pneumoniae NOT DETECTED NOT DETECTED Final   Proteus species NOT DETECTED NOT DETECTED Final   Salmonella species NOT DETECTED NOT DETECTED Final   Serratia marcescens NOT DETECTED NOT DETECTED Final   Haemophilus influenzae NOT DETECTED NOT DETECTED Final   Neisseria meningitidis NOT DETECTED NOT DETECTED Final   Pseudomonas aeruginosa NOT DETECTED NOT DETECTED Final   Stenotrophomonas maltophilia NOT DETECTED NOT DETECTED Final   Candida albicans NOT DETECTED NOT DETECTED Final   Candida auris NOT DETECTED NOT DETECTED Final   Candida glabrata NOT DETECTED NOT DETECTED Final   Candida krusei NOT DETECTED NOT DETECTED Final   Candida parapsilosis NOT DETECTED NOT DETECTED Final   Candida tropicalis NOT DETECTED NOT DETECTED Final   Cryptococcus neoformans/gattii NOT DETECTED NOT DETECTED Final   Meth resistant mecA/C and MREJ DETECTED (A) NOT DETECTED Final    Comment: CRITICAL RESULT CALLED TO, READ BACK BY AND VERIFIED WITH: SCOTT HALL $RemoveBe'@0250'[id number]$  08/26/20 RH Performed at [facility], [address], [place], [place] [address]   Surgical PCR screen     Status: Abnormal   Collection Time: 08/26/20  5:27 AM   Specimen: Nasal Mucosa; Nasal Swab  Result Value Ref Range Status   MRSA, PCR POSITIVE (A) NEGATIVE Final    Comment: RESULT CALLED TO, READ BACK BY AND VERIFIED WITH: CANDACE SUMMERS AT 6283 08/26/20 SDR    Staphylococcus aureus POSITIVE (A) NEGATIVE Final    Comment: (NOTE) The Xpert SA Assay (FDA approved for NASAL specimens in patients 57 years of age and  older), is one component of a comprehensive surveillance program. It is not intended to diagnose infection nor to guide or monitor treatment. Performed at [facility], [address] [place], [place] [address]   Anaerobic culture     Status: None   Collection Time: 08/26/20  5:27 PM   Specimen: PATH Bone biopsy; Tissue  Result Value Ref Range Status   Specimen Description   Final    BONE R DISTAL PHALANX BONE CULTURE Performed at [facility], [address] [place], [place] [address]    Special Requests   Final    NONE Performed at [facility], [address] [place], [place] [address]    Gram Stain   Final    MODERATE WBC PRESENT, PREDOMINANTLY PMN ABUNDANT GRAM POSITIVE COCCI    Culture   Final    ABUNDANT METHICILLIN RESISTANT STAPHYLOCOCCUS AUREUS NO ANAEROBES ISOLATED Performed at [facility], [place] [address], [place], [place] [address]    Report Status 08/31/2020 FINAL  Final   Organism ID, Bacteria METHICILLIN RESISTANT STAPHYLOCOCCUS AUREUS  Final      Susceptibility   Methicillin resistant staphylococcus aureus - MIC*    CIPROFLOXACIN <=0.5 SENSITIVE Sensitive     ERYTHROMYCIN <=0.25 SENSITIVE Sensitive     GENTAMICIN <=0.5 SENSITIVE Sensitive     OXACILLIN >=4 RESISTANT Resistant     TETRACYCLINE <=1 SENSITIVE Sensitive     VANCOMYCIN 1 SENSITIVE Sensitive     TRIMETH/SULFA <=10 SENSITIVE Sensitive     CLINDAMYCIN <=0.25  SENSITIVE Sensitive     RIFAMPIN <=0.5 SENSITIVE Sensitive     Inducible Clindamycin NEGATIVE Sensitive     * ABUNDANT METHICILLIN RESISTANT STAPHYLOCOCCUS AUREUS  CULTURE, BLOOD (ROUTINE X 2) w Reflex to ID Panel     Status: None   Collection Time: 08/28/20 11:36 AM   Specimen: BLOOD  Result Value Ref Range Status   Specimen Description BLOOD RIGHT [facility]  Final   Special Requests   Final    BOTTLES DRAWN AEROBIC AND ANAEROBIC Blood Culture adequate volume   Culture   Final    NO GROWTH 5 DAYS Performed at [facility], [address] [place], [place] [address]    Report Status 09/02/2020 FINAL  Final  CULTURE, BLOOD (ROUTINE X 2) w Reflex to ID Panel     Status: None   Collection Time: 08/28/20 11:42 AM   Specimen: BLOOD  Result Value Ref Range Status   Specimen Description BLOOD RIGHT HAND  Final   Special Requests   Final    BOTTLES DRAWN AEROBIC AND ANAEROBIC Blood Culture adequate volume   Culture   Final    NO GROWTH 5 DAYS Performed at [facility], [address] [place], [place] [address]    Report Status 09/02/2020 FINAL  Final    Coagulation Studies: No results for input(s): LABPROT, INR in the last 72 hours.  Urinalysis: No results for input(s): COLORURINE, LABSPEC, PHURINE, GLUCOSEU, HGBUR, BILIRUBINUR, KETONESUR, PROTEINUR, UROBILINOGEN, NITRITE, LEUKOCYTESUR in the last 72 hours.  Invalid input(s): APPERANCEUR    Imaging: No results found.   Medications:    sodium chloride     sodium chloride      amLODipine  10 mg Oral Daily   calcium acetate  1,334 mg Oral TID WC   Chlorhexidine Gluconate Cloth  6 each Topical Q0600   famotidine  20 mg Oral Daily   hydrALAZINE  25 mg Oral Q8H   insulin aspart  0-9  Units Subcutaneous Q4H   lisinopril  20 mg Oral Daily   metoprolol tartrate  50 mg Oral BID   pantoprazole (PROTONIX) IV  40 mg Intravenous Q12H   sodium chloride, sodium chloride, acetaminophen **OR** acetaminophen,  alteplase, benzonatate, heparin, HYDROcodone-acetaminophen, lidocaine (PF), lidocaine-prilocaine, ondansetron **OR** ondansetron (ZOFRAN) IV, pentafluoroprop-tetrafluoroeth  Assessment/ Plan:  [name] is a 60 y.o. black male with end stage renal disease on hemodialysis, hypertension, sleep apnea, diabetes mellitus type II, chronic back pain, right toe amputation who is admitted to [facility] on 09/23/2020 for Melena [K92.1] Hypertensive urgency [I16.0] Fluid overload [E87.70] Symptomatic anemia [D64.9] Dyspnea, unspecified type [R06.00] Congestive heart failure, unspecified HF chronicity, unspecified heart failure type ([place]) [I50.9]  [facility] TTS [place] Left AVF 115.5kg  1. End stage renal disease:  Outpatient cohort COVID-19 shift at [facility] TTS second shift. -  Patient had hemodialysis treatment yesterday.  No acute indication for dialysis today.  2. Hypertension: Continue amlodipine, lisinopril, and metoprolol.  3. Anemia with chronic kidney disease:   Lab Results  Component Value Date   HGB 7.8 (L) 09/25/2020    -Resume Epogen as outpatient.  4. Secondary Hyperparathyroidism: Continue calcium acetate 2 tablets p.o. 3 times daily with meals.   LOS: 2 Sandeep Radell 1/21/20222:52 PM
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
## 2020-09-30 LAB — SURGICAL PATHOLOGY

## 2020-10-06 ENCOUNTER — Other Ambulatory Visit: Payer: Self-pay

## 2020-10-06 ENCOUNTER — Emergency Department: Payer: Medicare HMO

## 2020-10-06 ENCOUNTER — Encounter: Payer: Self-pay | Admitting: Intensive Care

## 2020-10-06 ENCOUNTER — Inpatient Hospital Stay
Admission: EM | Admit: 2020-10-06 | Discharge: 2020-10-10 | DRG: 189 | Disposition: A | Discharge status: Home / Self Care | Payer: Medicare HMO | Attending: Obstetrics and Gynecology | Admitting: Obstetrics and Gynecology

## 2020-10-06 DIAGNOSIS — J810 Acute pulmonary edema: Principal | ICD-10-CM | POA: Diagnosis present

## 2020-10-06 DIAGNOSIS — E785 Hyperlipidemia, unspecified: Secondary | ICD-10-CM | POA: Diagnosis present

## 2020-10-06 DIAGNOSIS — K921 Melena: Secondary | ICD-10-CM

## 2020-10-06 DIAGNOSIS — R7989 Other specified abnormal findings of blood chemistry: Secondary | ICD-10-CM | POA: Diagnosis present

## 2020-10-06 DIAGNOSIS — G4730 Sleep apnea, unspecified: Secondary | ICD-10-CM | POA: Diagnosis present

## 2020-10-06 DIAGNOSIS — N186 End stage renal disease: Secondary | ICD-10-CM | POA: Diagnosis not present

## 2020-10-06 DIAGNOSIS — I120 Hypertensive chronic kidney disease with stage 5 chronic kidney disease or end stage renal disease: Secondary | ICD-10-CM | POA: Diagnosis present

## 2020-10-06 DIAGNOSIS — J9601 Acute respiratory failure with hypoxia: Secondary | ICD-10-CM | POA: Diagnosis not present

## 2020-10-06 DIAGNOSIS — Z9119 Patient's noncompliance with other medical treatment and regimen: Secondary | ICD-10-CM

## 2020-10-06 DIAGNOSIS — M549 Dorsalgia, unspecified: Secondary | ICD-10-CM | POA: Diagnosis present

## 2020-10-06 DIAGNOSIS — R778 Other specified abnormalities of plasma proteins: Secondary | ICD-10-CM | POA: Diagnosis not present

## 2020-10-06 DIAGNOSIS — E1169 Type 2 diabetes mellitus with other specified complication: Secondary | ICD-10-CM | POA: Diagnosis present

## 2020-10-06 DIAGNOSIS — M5137 Other intervertebral disc degeneration, lumbosacral region: Secondary | ICD-10-CM | POA: Diagnosis present

## 2020-10-06 DIAGNOSIS — R0600 Dyspnea, unspecified: Secondary | ICD-10-CM

## 2020-10-06 DIAGNOSIS — Z992 Dependence on renal dialysis: Secondary | ICD-10-CM

## 2020-10-06 DIAGNOSIS — Z79899 Other long term (current) drug therapy: Secondary | ICD-10-CM

## 2020-10-06 DIAGNOSIS — I10 Essential (primary) hypertension: Secondary | ICD-10-CM

## 2020-10-06 DIAGNOSIS — Z6834 Body mass index (BMI) 34.0-34.9, adult: Secondary | ICD-10-CM

## 2020-10-06 DIAGNOSIS — Z888 Allergy status to other drugs, medicaments and biological substances status: Secondary | ICD-10-CM

## 2020-10-06 DIAGNOSIS — E1122 Type 2 diabetes mellitus with diabetic chronic kidney disease: Secondary | ICD-10-CM | POA: Diagnosis present

## 2020-10-06 DIAGNOSIS — E669 Obesity, unspecified: Secondary | ICD-10-CM | POA: Diagnosis present

## 2020-10-06 DIAGNOSIS — R0602 Shortness of breath: Secondary | ICD-10-CM

## 2020-10-06 DIAGNOSIS — E8779 Other fluid overload: Secondary | ICD-10-CM

## 2020-10-06 DIAGNOSIS — G8929 Other chronic pain: Secondary | ICD-10-CM | POA: Diagnosis present

## 2020-10-06 DIAGNOSIS — Z66 Do not resuscitate: Secondary | ICD-10-CM | POA: Diagnosis present

## 2020-10-06 DIAGNOSIS — Z8616 Personal history of COVID-19: Secondary | ICD-10-CM

## 2020-10-06 DIAGNOSIS — Z8249 Family history of ischemic heart disease and other diseases of the circulatory system: Secondary | ICD-10-CM

## 2020-10-06 DIAGNOSIS — Z8719 Personal history of other diseases of the digestive system: Secondary | ICD-10-CM

## 2020-10-06 DIAGNOSIS — Z7982 Long term (current) use of aspirin: Secondary | ICD-10-CM

## 2020-10-06 DIAGNOSIS — E8770 Fluid overload, unspecified: Secondary | ICD-10-CM | POA: Diagnosis present

## 2020-10-06 DIAGNOSIS — D631 Anemia in chronic kidney disease: Secondary | ICD-10-CM | POA: Diagnosis present

## 2020-10-06 HISTORY — DX: Anemia, unspecified: D64.9

## 2020-10-06 HISTORY — DX: Dyspnea, unspecified: R06.00

## 2020-10-06 HISTORY — DX: Cardiac arrhythmia, unspecified: I49.9

## 2020-10-06 HISTORY — DX: Heart failure, unspecified: I50.9

## 2020-10-06 LAB — COMPREHENSIVE METABOLIC PANEL
ALT: 9 U/L (ref 0–44)
AST: 16 U/L (ref 15–41)
Albumin: 3.3 g/dL — ABNORMAL LOW (ref 3.5–5.0)
Alkaline Phosphatase: 55 U/L (ref 38–126)
Anion gap: 13 (ref 5–15)
BUN: 29 mg/dL — ABNORMAL HIGH (ref 8–23)
CO2: 26 mmol/L (ref 22–32)
Calcium: 9.3 mg/dL (ref 8.9–10.3)
Chloride: 98 mmol/L (ref 98–111)
Creatinine, Ser: 7.13 mg/dL — ABNORMAL HIGH (ref 0.61–1.24)
GFR, Estimated: 8 mL/min — ABNORMAL LOW (ref >60)
Glucose, Bld: 94 mg/dL (ref 70–99)
Potassium: 3.8 mmol/L (ref 3.5–5.1)
Sodium: 137 mmol/L (ref 135–145)
Total Bilirubin: 0.8 mg/dL (ref 0.3–1.2)
Total Protein: 8 g/dL (ref 6.5–8.1)

## 2020-10-06 LAB — CBC WITH DIFFERENTIAL/PLATELET
Abs Immature Granulocytes: 0.02 10*3/uL (ref 0.00–0.07)
Basophils Absolute: 0 10*3/uL (ref 0.0–0.1)
Basophils Relative: 0 %
Eosinophils Absolute: 0.6 10*3/uL — ABNORMAL HIGH (ref 0.0–0.5)
Eosinophils Relative: 9 %
HCT: 20.5 % — ABNORMAL LOW (ref 39.0–52.0)
Hemoglobin: 6.3 g/dL — ABNORMAL LOW (ref 13.0–17.0)
Immature Granulocytes: 0 %
Lymphocytes Relative: 15 %
Lymphs Abs: 1 10*3/uL (ref 0.7–4.0)
MCH: 27.5 pg (ref 26.0–34.0)
MCHC: 30.7 g/dL (ref 30.0–36.0)
MCV: 89.5 fL (ref 80.0–100.0)
Monocytes Absolute: 0.7 10*3/uL (ref 0.1–1.0)
Monocytes Relative: 10 %
Neutro Abs: 4.6 10*3/uL (ref 1.7–7.7)
Neutrophils Relative %: 66 %
Platelets: 225 10*3/uL (ref 150–400)
RBC: 2.29 MIL/uL — ABNORMAL LOW (ref 4.22–5.81)
RDW: 16.9 % — ABNORMAL HIGH (ref 11.5–15.5)
WBC: 6.9 10*3/uL (ref 4.0–10.5)
nRBC: 0 % (ref 0.0–0.2)

## 2020-10-06 LAB — PROCALCITONIN: Procalcitonin: 0.27 ng/mL

## 2020-10-06 LAB — TROPONIN I (HIGH SENSITIVITY)
Troponin I (High Sensitivity): 22 ng/L — ABNORMAL HIGH (ref <18)
Troponin I (High Sensitivity): 22 ng/L — ABNORMAL HIGH (ref <18)

## 2020-10-06 LAB — PHOSPHORUS: Phosphorus: 5.3 mg/dL — ABNORMAL HIGH (ref 2.5–4.6)

## 2020-10-06 LAB — HEPATITIS B SURFACE ANTIGEN: Hepatitis B Surface Ag: NONREACTIVE

## 2020-10-06 LAB — PREPARE RBC (CROSSMATCH)

## 2020-10-06 LAB — POC SARS CORONAVIRUS 2 AG -  ED: SARS Coronavirus 2 Ag: NEGATIVE

## 2020-10-06 IMAGING — DX DG CHEST 1V PORT
1 series · 1 of 1 positions shown · non-contrast
Comparison: [DATE]

CLINICAL DATA: Short of breath.  COVID 19.

EXAM:
PORTABLE CHEST 1 VIEW

[chest ap]
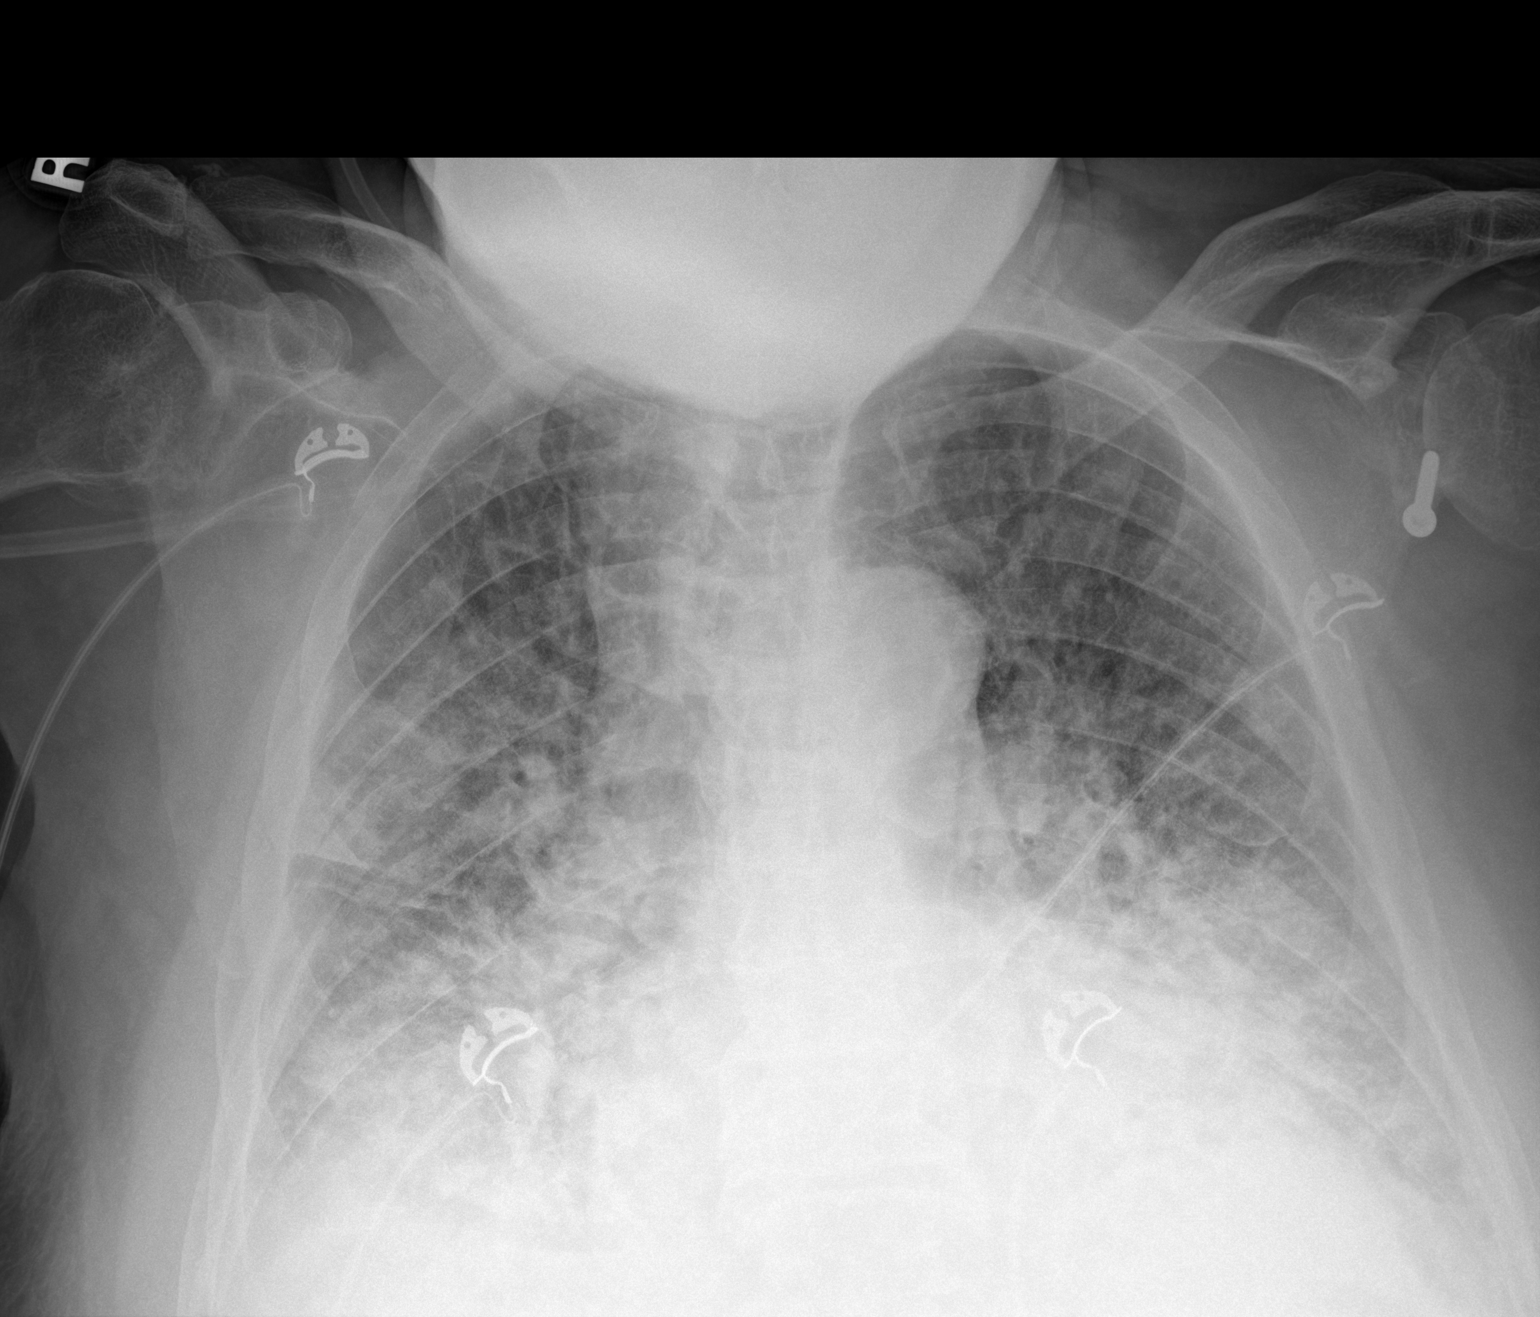

[1 of 1 positions shown; findings below may reference images not displayed]

FINDINGS: Midline trachea. Mild cardiomegaly, accentuated by AP portable
technique. Possible layering bilateral pleural effusions. No
pneumothorax. Worsened aeration, with right greater than left and
lower lung predominant interstitial and airspace disease
bilaterally.
IMPRESSION: Lower lung predominant interstitial and airspace disease is
progressive compared to [DATE]. Given the clinical history, at
least partially secondary to COVID 19 pneumonia. A component of
concurrent congestive heart failure is suspected.

## 2020-10-06 MED ORDER — SODIUM CHLORIDE 0.9% IV SOLUTION
Freq: Once | INTRAVENOUS | Status: DC
  Filled 2020-10-06: qty 250

## 2020-10-06 MED ORDER — ONDANSETRON HCL 4 MG PO TABS: 4.0000 mg | ORAL_TABLET | Freq: Four times a day (QID) | ORAL | Status: DC | PRN

## 2020-10-06 MED ORDER — METOPROLOL TARTRATE 5 MG/5ML IV SOLN: 5.0000 mg | INTRAVENOUS | Status: DC | PRN

## 2020-10-06 MED ORDER — BENZONATATE 100 MG PO CAPS
100.0000 mg | ORAL_CAPSULE | Freq: Two times a day (BID) | ORAL | Status: DC | PRN
  Administered 2020-10-08 – 2020-10-09 (×2): 100 mg via ORAL
  Filled 2020-10-06 (×2): qty 1

## 2020-10-06 MED ORDER — HEPARIN SODIUM (PORCINE) 5000 UNIT/ML IJ SOLN: 5000.0000 [IU] | Freq: Three times a day (TID) | INTRAMUSCULAR | Status: DC

## 2020-10-06 MED ORDER — LISINOPRIL 20 MG PO TABS
20.0000 mg | ORAL_TABLET | Freq: Every day | ORAL | Status: DC
  Administered 2020-10-07 – 2020-10-10 (×4): 20 mg via ORAL
  Filled 2020-10-06: qty 2
  Filled 2020-10-06 (×3): qty 1

## 2020-10-06 MED ORDER — EPOETIN ALFA 10000 UNIT/ML IJ SOLN
10000.0000 [IU] | Freq: Once | INTRAMUSCULAR | Status: AC
  Administered 2020-10-06: 10000 [IU] via INTRAVENOUS

## 2020-10-06 MED ORDER — CHLORHEXIDINE GLUCONATE CLOTH 2 % EX PADS
6.0000 | MEDICATED_PAD | Freq: Every day | CUTANEOUS | Status: DC
  Administered 2020-10-08 – 2020-10-09 (×2): 6 via TOPICAL
  Filled 2020-10-06 (×2): qty 6

## 2020-10-06 MED ORDER — ACETAMINOPHEN 325 MG PO TABS
325.0000 mg | ORAL_TABLET | Freq: Four times a day (QID) | ORAL | Status: AC | PRN
  Administered 2020-10-07 – 2020-10-08 (×2): 325 mg via ORAL
  Filled 2020-10-06 (×2): qty 1

## 2020-10-06 MED ORDER — FUROSEMIDE 10 MG/ML IJ SOLN: 60.0000 mg | Freq: Once | INTRAMUSCULAR | Status: DC

## 2020-10-06 MED ORDER — HYDRALAZINE HCL 25 MG PO TABS
25.0000 mg | ORAL_TABLET | Freq: Three times a day (TID) | ORAL | Status: DC
Start: 2020-10-06 — End: 2020-10-10
  Administered 2020-10-06 – 2020-10-10 (×10): 25 mg via ORAL
  Filled 2020-10-06 (×10): qty 1

## 2020-10-06 MED ORDER — ONDANSETRON HCL 4 MG/2ML IJ SOLN: 4.0000 mg | Freq: Four times a day (QID) | INTRAMUSCULAR | Status: DC | PRN

## 2020-10-06 MED ORDER — METOPROLOL TARTRATE 50 MG PO TABS
50.0000 mg | ORAL_TABLET | Freq: Two times a day (BID) | ORAL | Status: DC
  Administered 2020-10-06 – 2020-10-10 (×7): 50 mg via ORAL
  Filled 2020-10-06 (×7): qty 1

## 2020-10-06 MED ORDER — AMLODIPINE BESYLATE 10 MG PO TABS
10.0000 mg | ORAL_TABLET | Freq: Every day | ORAL | Status: DC
  Administered 2020-10-07 – 2020-10-10 (×4): 10 mg via ORAL
  Filled 2020-10-06: qty 1
  Filled 2020-10-06: qty 2
  Filled 2020-10-06 (×2): qty 1

## 2020-10-06 MED ORDER — ACETAMINOPHEN 650 MG RE SUPP: 325.0000 mg | Freq: Four times a day (QID) | RECTAL | Status: AC | PRN

## 2020-10-06 MED ORDER — FAMOTIDINE 20 MG PO TABS
20.0000 mg | ORAL_TABLET | Freq: Every day | ORAL | Status: DC
  Administered 2020-10-07: 20 mg via ORAL
  Filled 2020-10-06: qty 1

## 2020-10-06 MED ORDER — ALBUTEROL SULFATE HFA 108 (90 BASE) MCG/ACT IN AERS
1.0000 | INHALATION_SPRAY | RESPIRATORY_TRACT | Status: DC | PRN
  Filled 2020-10-06 (×2): qty 6.7

## 2020-10-06 NOTE — Progress Notes (Signed)
Help transport patient to dialysis on non rebreather, placed back on [place] at 94% @ 50L. Tolerating well at this time, sats 95%.

## 2020-10-06 NOTE — ED Notes (Signed)
Patient sats ranging from 86-90% on 6L. Patient has increased work of breathing. MD made aware and ordered bi-pap
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
## 2020-10-06 NOTE — Progress Notes (Signed)
[facility]  ROUNDING NOTE   Subjective:    presented to ER for SOB that worsened over last 2 days. Covid + on Jan 4 Significantly short of breath when seen Urgent hemodialysis requested by the emergency room Patient states he went for his usual dialysis on Saturday He admits to drinking extra amount of soda over the weekend   Objective:  Vital signs in last 24 hours:  Temp:  [97.9 F (36.6 C)] 97.9 F (36.6 C) (01/31 1019) Pulse Rate:  [88] 88 (01/31 1019) Resp:  [28] 28 (01/31 1019) BP: (164)/(85) 164/85 (01/31 1019) SpO2:  [83 %-94 %] 94 % (01/31 1028) Weight:  [108.9 kg] 108.9 kg (01/31 1024)  Weight change:  Filed Weights   10/06/20 1024  Weight: 108.9 kg    Intake/Output: No intake/output data recorded.   Intake/Output this shift:  No intake/output data recorded.  Physical Exam: General: NAD, sitting up on the edge of bed  Head: Normocephalic, atraumatic. Moist oral mucosal membranes  Lungs:   High flow nasal cannula oxygen, bilateral diffuse crackles  Heart: Regular, tachycardic  Abdomen:  Soft, nontender  Extremities: 2+ peripheral edema.  Neurologic: Nonfocal, moving all four extremities  Skin: No lesions  Access: Left AVF    Basic Metabolic Panel: Recent Labs  Lab 10/06/20 1029  NA 137  K 3.8  CL 98  CO2 26  GLUCOSE 94  BUN 29*  CREATININE 7.13*  CALCIUM 9.3    Liver Function Tests: Recent Labs  Lab 10/06/20 1029  AST 16  ALT 9  ALKPHOS 55  BILITOT 0.8  PROT 8.0  ALBUMIN 3.3*   No results for input(s): LIPASE, AMYLASE in the last 168 hours. No results for input(s): AMMONIA in the last 168 hours.  CBC: Recent Labs  Lab 10/06/20 1029  WBC 6.9  NEUTROABS 4.6  HGB 6.3*  HCT 20.5*  MCV 89.5  PLT 225    Cardiac Enzymes: No results for input(s): CKTOTAL, CKMB, CKMBINDEX, TROPONINI in the last 168 hours.  BNP: Invalid input(s): POCBNP  CBG: No results for input(s): GLUCAP in the last 168  hours.  Microbiology: Results for orders placed or performed during the hospital encounter of 08/25/20  Resp Panel by RT-PCR (Flu A&B, Covid) Nasopharyngeal Swab     Status: None   Collection Time: 08/25/20 12:29 PM   Specimen: Nasopharyngeal Swab; Nasopharyngeal(NP) swabs in vial transport medium  Result Value Ref Range Status   SARS Coronavirus 2 by RT PCR NEGATIVE NEGATIVE Final    Comment: (NOTE) SARS-CoV-2 target nucleic acids are NOT DETECTED.  The SARS-CoV-2 RNA is generally detectable in upper respiratory specimens during the acute phase of infection. The lowest concentration of SARS-CoV-2 viral copies this assay can detect is 138 copies/mL. A negative result does not preclude SARS-Cov-2 infection and should not be used as the sole basis for treatment or other patient management decisions. A negative result may occur with  improper specimen collection/handling, submission of specimen other than nasopharyngeal swab, presence of viral mutation(s) within the areas targeted by this assay, and inadequate number of viral copies(<138 copies/mL). A negative result must be combined with clinical observations, patient history, and epidemiological information. The expected result is Negative.  Fact Sheet for Patients:  EntrepreneurPulse.com.au  Fact Sheet for Healthcare Providers:  IncredibleEmployment.be  This test is no t yet approved or cleared by the [place] FDA and  has been authorized for detection and/or diagnosis of SARS-CoV-2 by FDA under an Emergency Use Authorization (EUA). This  EUA will remain  in effect (meaning this test can be used) for the duration of the COVID-19 declaration under Section 564(b)(1) of the Act, 21 U.S.C.section 360bbb-3(b)(1), unless the authorization is terminated  or revoked sooner.       Influenza A by PCR NEGATIVE NEGATIVE Final   Influenza B by PCR NEGATIVE NEGATIVE Final    Comment: (NOTE) The  Xpert Xpress SARS-CoV-2/FLU/RSV plus assay is intended as an aid in the diagnosis of influenza from Nasopharyngeal swab specimens and should not be used as a sole basis for treatment. Nasal washings and aspirates are unacceptable for Xpert Xpress SARS-CoV-2/FLU/RSV testing.  Fact Sheet for Patients: EntrepreneurPulse.com.au  Fact Sheet for Healthcare Providers: IncredibleEmployment.be  This test is not yet approved or cleared by the [place] FDA and has been authorized for detection and/or diagnosis of SARS-CoV-2 by FDA under an Emergency Use Authorization (EUA). This EUA will remain in effect (meaning this test can be used) for the duration of the COVID-19 declaration under Section 564(b)(1) of the Act, 21 U.S.C. section 360bbb-3(b)(1), unless the authorization is terminated or revoked.  Performed at [facility], [address] [place], [place] [address]   Culture, blood (routine x 2)     Status: Abnormal   Collection Time: 08/25/20  1:35 PM   Specimen: BLOOD  Result Value Ref Range Status   Specimen Description   Final    BLOOD BLOOD RIGHT FOREARM Performed at [facility], [address], [place], [place] [address]    Special Requests   Final    BOTTLES DRAWN AEROBIC AND ANAEROBIC Blood Culture results may not be optimal due to an excessive volume of blood received in culture bottles Performed at [facility], [address] [place], [place] [address]    Culture  Setup Time   Final    GRAM POSITIVE COCCI IN BOTH AEROBIC AND ANAEROBIC BOTTLES Organism ID to follow CRITICAL RESULT CALLED TO, READ BACK BY AND VERIFIED WITH: SCOTT HALL $RemoveBe'[email or web address]$  08/26/20 RH Performed at [facility], [address] [place], [place] [address]    Culture METHICILLIN RESISTANT STAPHYLOCOCCUS AUREUS (A)  Final   Report Status 08/28/2020 FINAL  Final   Organism ID, Bacteria METHICILLIN RESISTANT STAPHYLOCOCCUS  AUREUS  Final      Susceptibility   Methicillin resistant staphylococcus aureus - MIC*    CIPROFLOXACIN <=0.5 SENSITIVE Sensitive     ERYTHROMYCIN <=0.25 SENSITIVE Sensitive     GENTAMICIN <=0.5 SENSITIVE Sensitive     OXACILLIN >=4 RESISTANT Resistant     TETRACYCLINE <=1 SENSITIVE Sensitive     VANCOMYCIN <=0.5 SENSITIVE Sensitive     TRIMETH/SULFA <=10 SENSITIVE Sensitive     CLINDAMYCIN <=0.25 SENSITIVE Sensitive     RIFAMPIN <=0.5 SENSITIVE Sensitive     Inducible Clindamycin NEGATIVE Sensitive     * METHICILLIN RESISTANT STAPHYLOCOCCUS AUREUS  Culture, blood (routine x 2)     Status: None   Collection Time: 08/25/20  1:35 PM   Specimen: BLOOD  Result Value Ref Range Status   Specimen Description BLOOD BLOOD RIGHT HAND  Final   Special Requests   Final    BOTTLES DRAWN AEROBIC AND ANAEROBIC Blood Culture adequate volume   Culture   Final    NO GROWTH 5 DAYS Performed at [facility], [address], [place], [place] [address]    Report Status 08/30/2020 FINAL  Final  Blood Culture ID Panel (Reflexed)     Status: Abnormal   Collection Time: 08/25/20  1:35 PM  Result Value Ref Range Status   Enterococcus faecalis NOT DETECTED NOT DETECTED Final   Enterococcus Faecium NOT DETECTED NOT DETECTED Final   Listeria monocytogenes NOT DETECTED NOT DETECTED Final   Staphylococcus species DETECTED (A) NOT DETECTED Final    Comment: CRITICAL RESULT CALLED TO, READ BACK BY AND VERIFIED WITH: SCOTT HALL $RemoveBe'[email or web address]$  08/26/20 RH    Staphylococcus aureus (BCID) DETECTED (A) NOT DETECTED Final    Comment: Methicillin (oxacillin)-resistant Staphylococcus aureus (MRSA). MRSA is predictably resistant to beta-lactam antibiotics (except ceftaroline). Preferred therapy is vancomycin unless clinically contraindicated. Patient requires contact precautions if  hospitalized. CRITICAL RESULT CALLED TO, READ BACK BY AND VERIFIED WITH: SCOTT HALL $RemoveBe'@0250'[id number]$  08/26/20 RH    Staphylococcus epidermidis NOT  DETECTED NOT DETECTED Final   Staphylococcus lugdunensis NOT DETECTED NOT DETECTED Final   Streptococcus species NOT DETECTED NOT DETECTED Final   Streptococcus agalactiae NOT DETECTED NOT DETECTED Final   Streptococcus pneumoniae NOT DETECTED NOT DETECTED Final   Streptococcus pyogenes NOT DETECTED NOT DETECTED Final   A.calcoaceticus-baumannii NOT DETECTED NOT DETECTED Final   Bacteroides fragilis NOT DETECTED NOT DETECTED Final   Enterobacterales NOT DETECTED NOT DETECTED Final   Enterobacter cloacae complex NOT DETECTED NOT DETECTED Final   Escherichia coli NOT DETECTED NOT DETECTED Final   Klebsiella aerogenes NOT DETECTED NOT DETECTED Final   Klebsiella oxytoca NOT DETECTED NOT DETECTED Final   Klebsiella pneumoniae NOT DETECTED NOT DETECTED Final   Proteus species NOT DETECTED NOT DETECTED Final   Salmonella species NOT DETECTED NOT DETECTED Final   Serratia marcescens NOT DETECTED NOT DETECTED Final   Haemophilus influenzae NOT DETECTED NOT DETECTED Final   Neisseria meningitidis NOT DETECTED NOT DETECTED Final   Pseudomonas aeruginosa NOT DETECTED NOT DETECTED Final   Stenotrophomonas maltophilia NOT DETECTED NOT DETECTED Final   Candida albicans NOT DETECTED NOT DETECTED Final   Candida auris NOT DETECTED NOT DETECTED Final   Candida glabrata NOT DETECTED NOT DETECTED Final   Candida krusei NOT DETECTED NOT DETECTED Final   Candida parapsilosis NOT DETECTED NOT DETECTED Final   Candida tropicalis NOT DETECTED NOT DETECTED Final   Cryptococcus neoformans/gattii NOT DETECTED NOT DETECTED Final   Meth resistant mecA/C and MREJ DETECTED (A) NOT DETECTED Final    Comment: CRITICAL RESULT CALLED TO, READ BACK BY AND VERIFIED WITH: SCOTT HALL $RemoveBe'[email or web address]'[email or web address]$  08/26/20 RH Performed at [facility], [address], [place], [place] [address]   Surgical PCR screen     Status: Abnormal   Collection Time: 08/26/20  5:27 AM   Specimen: Nasal Mucosa; Nasal Swab  Result Value Ref Range  Status   MRSA, PCR POSITIVE (A) NEGATIVE Final    Comment: RESULT CALLED TO, READ BACK BY AND VERIFIED WITH: CANDACE SUMMERS AT 3614 08/26/20 SDR    Staphylococcus aureus POSITIVE (A) NEGATIVE Final    Comment: (NOTE) The Xpert SA Assay (FDA approved for NASAL specimens in patients 20 years of age and older), is one component of a comprehensive surveillance program. It is not intended to diagnose infection nor to guide or monitor treatment. Performed at [facility], [address] [place], [place] [address]   Anaerobic culture     Status: None   Collection Time: 08/26/20  5:27 PM   Specimen: PATH Bone biopsy; Tissue  Result Value Ref Range Status   Specimen Description   Final    BONE R DISTAL PHALANX BONE CULTURE Performed at [facility], [address] [place], [place] [address]  Special Requests   Final    NONE Performed at [facility], [address] [place], [place] [address]    Gram Stain   Final    MODERATE WBC PRESENT, PREDOMINANTLY PMN ABUNDANT GRAM POSITIVE COCCI    Culture   Final    ABUNDANT METHICILLIN RESISTANT STAPHYLOCOCCUS AUREUS NO ANAEROBES ISOLATED Performed at [facility], [place] [address] [place], [place] [address]    Report Status 08/31/2020 FINAL  Final   Organism ID, Bacteria METHICILLIN RESISTANT STAPHYLOCOCCUS AUREUS  Final      Susceptibility   Methicillin resistant staphylococcus aureus - MIC*    CIPROFLOXACIN <=0.5 SENSITIVE Sensitive     ERYTHROMYCIN <=0.25 SENSITIVE Sensitive     GENTAMICIN <=0.5 SENSITIVE Sensitive     OXACILLIN >=4 RESISTANT Resistant     TETRACYCLINE <=1 SENSITIVE Sensitive     VANCOMYCIN 1 SENSITIVE Sensitive     TRIMETH/SULFA <=10 SENSITIVE Sensitive     CLINDAMYCIN <=0.25 SENSITIVE Sensitive     RIFAMPIN <=0.5 SENSITIVE Sensitive     Inducible Clindamycin NEGATIVE Sensitive     * ABUNDANT METHICILLIN RESISTANT STAPHYLOCOCCUS AUREUS  CULTURE, BLOOD (ROUTINE X 2) w Reflex  to ID Panel     Status: None   Collection Time: 08/28/20 11:36 AM   Specimen: BLOOD  Result Value Ref Range Status   Specimen Description BLOOD RIGHT [facility]  Final   Special Requests   Final    BOTTLES DRAWN AEROBIC AND ANAEROBIC Blood Culture adequate volume   Culture   Final    NO GROWTH 5 DAYS Performed at [facility], [address] [place], [place] [address]    Report Status 09/02/2020 FINAL  Final  CULTURE, BLOOD (ROUTINE X 2) w Reflex to ID Panel     Status: None   Collection Time: 08/28/20 11:42 AM   Specimen: BLOOD  Result Value Ref Range Status   Specimen Description BLOOD RIGHT HAND  Final   Special Requests   Final    BOTTLES DRAWN AEROBIC AND ANAEROBIC Blood Culture adequate volume   Culture   Final    NO GROWTH 5 DAYS Performed at [facility], [address], [place], [place] [address]    Report Status 09/02/2020 FINAL  Final    Coagulation Studies: No results for input(s): LABPROT, INR in the last 72 hours.  Urinalysis: No results for input(s): COLORURINE, LABSPEC, PHURINE, GLUCOSEU, HGBUR, BILIRUBINUR, KETONESUR, PROTEINUR, UROBILINOGEN, NITRITE, LEUKOCYTESUR in the last 72 hours.  Invalid input(s): APPERANCEUR    Imaging: DG Chest Port 1 View  Result Date: 10/06/2020 CLINICAL DATA:  Short of breath.  COVID 19. EXAM: PORTABLE CHEST 1 VIEW COMPARISON:  09/23/2020 FINDINGS: Midline trachea. Mild cardiomegaly, accentuated by AP portable technique. Possible layering bilateral pleural effusions. No pneumothorax. Worsened aeration, with right greater than left and lower lung predominant interstitial and airspace disease bilaterally. IMPRESSION: Lower lung predominant interstitial and airspace disease is progressive compared to 09/23/2020. Given the clinical history, at least partially secondary to COVID 19 pneumonia. A component of concurrent congestive heart failure is suspected. Electronically Signed   By: Abigail Miyamoto M.D.   On: 10/06/2020 11:22      Medications:    . sodium chloride   Intravenous Once  . amLODipine  10 mg Oral Daily  . [START ON 10/07/2020] famotidine  20 mg Oral Daily  . furosemide  60 mg Intravenous Once  . hydrALAZINE  25 mg Oral Q8H  . lisinopril  20 mg Oral Daily  .  metoprolol tartrate  50 mg Oral BID   acetaminophen **OR** acetaminophen, albuterol, benzonatate, metoprolol tartrate, ondansetron **OR** ondansetron (ZOFRAN) IV  Assessment/ Plan:  [name] is a 60 y.o. black male with end stage renal disease on hemodialysis, hypertension, sleep apnea, diabetes mellitus type II, chronic back pain, right toe amputation who is admitted to [facility] on 10/06/2020 for Acute hypoxemic respiratory failure ([place]) [J96.01]  [facility] [place] Left AVF 115.5kg  #. Shortness of breath due to acute pulmonary edema In the setting of end-stage renal disease Plan for urgent hemodialysis tonight for volume removal Reassess in the morning for repeat hemodialysis Ultrafiltration goal 2.5 to 3 L as tolerated  #. Anemia with chronic kidney disease:   Lab Results  Component Value Date   HGB 6.3 (L) 10/06/2020  Blood transfusion planned during dialysis.  Ordered by hospitalist team. -Epogen with hemodialysis  #Recent history of GI bleed EGD done on January 20 showed diffuse moderately erythematous mucosa without active bleeding.  Colonoscopy recommended as outpatient  #. Secondary Hyperparathyroidism:  Previously was on calcium acetate 2 tablets p.o. 3 times daily with meals. Monitor phos   LOS: 0 Koven Belinsky 1/31/20221:45 PM

## 2020-10-06 NOTE — ED Notes (Signed)
Patient fanning himself and keeps repeating "please help me, lord"

## 2020-10-06 NOTE — ED Notes (Addendum)
Pt taken to dialysis on a non-rebreather at 15lpm. Pts oxygen dropped into the 80s. RT followed with high flow. Pts oxygen went up to 98% back on high flow oxygen. RN went to blood bank and got pts unit of blood to transfuse at request of dialysis RN. Report given to [facility] RN
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
## 2020-10-06 NOTE — ED Notes (Addendum)
Pt is back from dialysis. RN and NP went and brought pt down on 6lpm via [place] and a non-rebreather at 15lpm for transport. Pt is back on HFNC. Pt on cardiac, bp and pulse ox monitor. Pt states breathing is a little easier after dialysis

## 2020-10-06 NOTE — ED Notes (Signed)
Pt refusing bipap at this time. MD [place] in room at this time. MD ordering high flow instead. RT made aware

## 2020-10-06 NOTE — ED Notes (Signed)
RT at bedside.

## 2020-10-06 NOTE — ED Notes (Signed)
PAtient is non compliant with keeping monitor leads on

## 2020-10-06 NOTE — ED Triage Notes (Signed)
Patient c/o sob that worsened last two days. Recently admitted to [facility] with covid. A&O x4 during triage. Room air upon arrival 83%

## 2020-10-06 NOTE — ED Notes (Signed)
Pt states coming in with shortness of breath. Pt noted to sitting at end of bed and states it helps him breath. Call bell within reach. Pt resting stating "I am feeling better."  As per previous RN, pt to get blood transfusion during dialysis and that pt should be going to dialysis shortly.  Pt on cardiac and pulse ox monitor.

## 2020-10-07 DIAGNOSIS — Z66 Do not resuscitate: Secondary | ICD-10-CM | POA: Diagnosis present

## 2020-10-07 DIAGNOSIS — Z9119 Patient's noncompliance with other medical treatment and regimen: Secondary | ICD-10-CM | POA: Diagnosis not present

## 2020-10-07 DIAGNOSIS — Z8719 Personal history of other diseases of the digestive system: Secondary | ICD-10-CM | POA: Diagnosis not present

## 2020-10-07 DIAGNOSIS — E1122 Type 2 diabetes mellitus with diabetic chronic kidney disease: Secondary | ICD-10-CM | POA: Diagnosis present

## 2020-10-07 DIAGNOSIS — N186 End stage renal disease: Secondary | ICD-10-CM | POA: Diagnosis present

## 2020-10-07 DIAGNOSIS — I120 Hypertensive chronic kidney disease with stage 5 chronic kidney disease or end stage renal disease: Secondary | ICD-10-CM | POA: Diagnosis present

## 2020-10-07 DIAGNOSIS — J9601 Acute respiratory failure with hypoxia: Secondary | ICD-10-CM | POA: Diagnosis present

## 2020-10-07 DIAGNOSIS — Z8249 Family history of ischemic heart disease and other diseases of the circulatory system: Secondary | ICD-10-CM | POA: Diagnosis not present

## 2020-10-07 DIAGNOSIS — Z888 Allergy status to other drugs, medicaments and biological substances status: Secondary | ICD-10-CM | POA: Diagnosis not present

## 2020-10-07 DIAGNOSIS — G4730 Sleep apnea, unspecified: Secondary | ICD-10-CM | POA: Diagnosis present

## 2020-10-07 DIAGNOSIS — Z7982 Long term (current) use of aspirin: Secondary | ICD-10-CM | POA: Diagnosis not present

## 2020-10-07 DIAGNOSIS — Z6834 Body mass index (BMI) 34.0-34.9, adult: Secondary | ICD-10-CM | POA: Diagnosis not present

## 2020-10-07 DIAGNOSIS — E785 Hyperlipidemia, unspecified: Secondary | ICD-10-CM | POA: Diagnosis present

## 2020-10-07 DIAGNOSIS — M549 Dorsalgia, unspecified: Secondary | ICD-10-CM | POA: Diagnosis present

## 2020-10-07 DIAGNOSIS — D631 Anemia in chronic kidney disease: Secondary | ICD-10-CM | POA: Diagnosis present

## 2020-10-07 DIAGNOSIS — G8929 Other chronic pain: Secondary | ICD-10-CM | POA: Diagnosis present

## 2020-10-07 DIAGNOSIS — E669 Obesity, unspecified: Secondary | ICD-10-CM | POA: Diagnosis present

## 2020-10-07 DIAGNOSIS — Z79899 Other long term (current) drug therapy: Secondary | ICD-10-CM | POA: Diagnosis not present

## 2020-10-07 DIAGNOSIS — E1169 Type 2 diabetes mellitus with other specified complication: Secondary | ICD-10-CM | POA: Diagnosis present

## 2020-10-07 DIAGNOSIS — J810 Acute pulmonary edema: Secondary | ICD-10-CM | POA: Diagnosis present

## 2020-10-07 DIAGNOSIS — Z992 Dependence on renal dialysis: Secondary | ICD-10-CM | POA: Diagnosis not present

## 2020-10-07 DIAGNOSIS — M5137 Other intervertebral disc degeneration, lumbosacral region: Secondary | ICD-10-CM | POA: Diagnosis present

## 2020-10-07 DIAGNOSIS — Z8616 Personal history of COVID-19: Secondary | ICD-10-CM | POA: Diagnosis not present

## 2020-10-07 LAB — CBC
HCT: 19.7 % — ABNORMAL LOW (ref 39.0–52.0)
Hemoglobin: 6.2 g/dL — ABNORMAL LOW (ref 13.0–17.0)
MCH: 27.9 pg (ref 26.0–34.0)
MCHC: 31.5 g/dL (ref 30.0–36.0)
MCV: 88.7 fL (ref 80.0–100.0)
Platelets: 241 10*3/uL (ref 150–400)
RBC: 2.22 MIL/uL — ABNORMAL LOW (ref 4.22–5.81)
RDW: 17.3 % — ABNORMAL HIGH (ref 11.5–15.5)
WBC: 8.4 10*3/uL (ref 4.0–10.5)
nRBC: 0 % (ref 0.0–0.2)

## 2020-10-07 LAB — COMPREHENSIVE METABOLIC PANEL
ALT: 10 U/L (ref 0–44)
AST: 14 U/L — ABNORMAL LOW (ref 15–41)
Albumin: 3.2 g/dL — ABNORMAL LOW (ref 3.5–5.0)
Alkaline Phosphatase: 53 U/L (ref 38–126)
Anion gap: 11 (ref 5–15)
BUN: 24 mg/dL — ABNORMAL HIGH (ref 8–23)
CO2: 28 mmol/L (ref 22–32)
Calcium: 9 mg/dL (ref 8.9–10.3)
Chloride: 97 mmol/L — ABNORMAL LOW (ref 98–111)
Creatinine, Ser: 5.69 mg/dL — ABNORMAL HIGH (ref 0.61–1.24)
GFR, Estimated: 11 mL/min — ABNORMAL LOW (ref >60)
Glucose, Bld: 109 mg/dL — ABNORMAL HIGH (ref 70–99)
Potassium: 3.9 mmol/L (ref 3.5–5.1)
Sodium: 136 mmol/L (ref 135–145)
Total Bilirubin: 0.7 mg/dL (ref 0.3–1.2)
Total Protein: 7.7 g/dL (ref 6.5–8.1)

## 2020-10-07 LAB — GLUCOSE, CAPILLARY: Glucose-Capillary: 106 mg/dL — ABNORMAL HIGH (ref 70–99)

## 2020-10-07 LAB — PREPARE RBC (CROSSMATCH)

## 2020-10-07 LAB — HEPATITIS B SURFACE ANTIBODY, QUANTITATIVE: Hep B S AB Quant (Post): 3.4 m[IU]/mL — ABNORMAL LOW (ref >9.9)

## 2020-10-07 MED ORDER — FAMOTIDINE 20 MG PO TABS
10.0000 mg | ORAL_TABLET | Freq: Every day | ORAL | Status: DC
  Administered 2020-10-08 – 2020-10-10 (×3): 10 mg via ORAL
  Filled 2020-10-07 (×3): qty 1

## 2020-10-07 MED ORDER — SODIUM CHLORIDE 0.9% IV SOLUTION
Freq: Once | INTRAVENOUS | Status: DC
  Filled 2020-10-07: qty 250

## 2020-10-07 NOTE — ED Notes (Signed)
Patient took O2 via nasal cannula off to blow his nose. O2 sats dropped to 78%-80%. Patient became more short of breath. Nasal cannula high flow returned at previous rate , patient tolerating well.

## 2020-10-07 NOTE — Progress Notes (Signed)
[facility]  ROUNDING NOTE   Subjective:    presented to ER for SOB that worsened over last 2 days. Covid + on Jan 4 Significantly short of breath when seen Patient underwent emergency hemodialysis yesterday.  2900 cc of fluid was removed.  He continues to require high flow oxygen today.  He still has some difficulty breathing although improved from yesterday.  Crackles swelling   Objective:  Vital signs in last 24 hours:  Temp:  [98.1 F (36.7 C)-98.2 F (36.8 C)] 98.2 F (36.8 C) (01/31 2338) Pulse Rate:  [79-104] 88 (02/01 0600) Resp:  [14-31] 14 (02/01 0600) BP: (165-196)/(77-109) 175/77 (02/01 0600) SpO2:  [95 %-100 %] 97 % (02/01 0600) FiO2 (%):  [94 %-100 %] 100 % (02/01 0211)  Weight change:  Filed Weights   10/06/20 1024  Weight: 108.9 kg    Intake/Output: I/O last 3 completed shifts: In: 477.5 [Blood:477.5] Out: 2900 [Other:2900]   Intake/Output this shift:  No intake/output data recorded.  Physical Exam: General: NAD, sitting up on the edge of bed  Head: Normocephalic, atraumatic. Moist oral mucosal membranes  Lungs:   High flow nasal cannula oxygen, bilateral diffuse crackles  Heart: Regular, tachycardic  Abdomen:  Soft, nontender  Extremities: 2+ peripheral edema.  Neurologic: Nonfocal, moving all four extremities  Skin: No lesions  Access: Left AVF    Basic Metabolic Panel: Recent Labs  Lab 10/06/20 1029 10/06/20 1211 10/07/20 0630  NA 137  --  136  K 3.8  --  3.9  CL 98  --  97*  CO2 26  --  28  GLUCOSE 94  --  109*  BUN 29*  --  24*  CREATININE 7.13*  --  5.69*  CALCIUM 9.3  --  9.0  PHOS  --  5.3*  --     Liver Function Tests: Recent Labs  Lab 10/06/20 1029 10/07/20 0630  AST 16 14*  ALT 9 10  ALKPHOS 55 53  BILITOT 0.8 0.7  PROT 8.0 7.7  ALBUMIN 3.3* 3.2*   No results for input(s): LIPASE, AMYLASE in the last 168 hours. No results for input(s): AMMONIA in the last 168 hours.  CBC: Recent Labs  Lab  10/06/20 1029 10/07/20 0630  WBC 6.9 8.4  NEUTROABS 4.6  --   HGB 6.3* 6.2*  HCT 20.5* 19.7*  MCV 89.5 88.7  PLT 225 241    Cardiac Enzymes: No results for input(s): CKTOTAL, CKMB, CKMBINDEX, TROPONINI in the last 168 hours.  BNP: Invalid input(s): POCBNP  CBG: No results for input(s): GLUCAP in the last 168 hours.  Microbiology: Results for orders placed or performed during the hospital encounter of 08/25/20  Resp Panel by RT-PCR (Flu A&B, Covid) Nasopharyngeal Swab     Status: None   Collection Time: 08/25/20 12:29 PM   Specimen: Nasopharyngeal Swab; Nasopharyngeal(NP) swabs in vial transport medium  Result Value Ref Range Status   SARS Coronavirus 2 by RT PCR NEGATIVE NEGATIVE Final    Comment: (NOTE) SARS-CoV-2 target nucleic acids are NOT DETECTED.  The SARS-CoV-2 RNA is generally detectable in upper respiratory specimens during the acute phase of infection. The lowest concentration of SARS-CoV-2 viral copies this assay can detect is 138 copies/mL. A negative result does not preclude SARS-Cov-2 infection and should not be used as the sole basis for treatment or other patient management decisions. A negative result may occur with  improper specimen collection/handling, submission of specimen other than nasopharyngeal swab, presence of viral mutation(s) within  the areas targeted by this assay, and inadequate number of viral copies(<138 copies/mL). A negative result must be combined with clinical observations, patient history, and epidemiological information. The expected result is Negative.  Fact Sheet for Patients:  EntrepreneurPulse.com.au  Fact Sheet for Healthcare Providers:  IncredibleEmployment.be  This test is no t yet approved or cleared by the [place] FDA and  has been authorized for detection and/or diagnosis of SARS-CoV-2 by FDA under an Emergency Use Authorization (EUA). This EUA will remain  in effect  (meaning this test can be used) for the duration of the COVID-19 declaration under Section 564(b)(1) of the Act, 21 U.S.C.section 360bbb-3(b)(1), unless the authorization is terminated  or revoked sooner.       Influenza A by PCR NEGATIVE NEGATIVE Final   Influenza B by PCR NEGATIVE NEGATIVE Final    Comment: (NOTE) The Xpert Xpress SARS-CoV-2/FLU/RSV plus assay is intended as an aid in the diagnosis of influenza from Nasopharyngeal swab specimens and should not be used as a sole basis for treatment. Nasal washings and aspirates are unacceptable for Xpert Xpress SARS-CoV-2/FLU/RSV testing.  Fact Sheet for Patients: EntrepreneurPulse.com.au  Fact Sheet for Healthcare Providers: IncredibleEmployment.be  This test is not yet approved or cleared by the [place] FDA and has been authorized for detection and/or diagnosis of SARS-CoV-2 by FDA under an Emergency Use Authorization (EUA). This EUA will remain in effect (meaning this test can be used) for the duration of the COVID-19 declaration under Section 564(b)(1) of the Act, 21 U.S.C. section 360bbb-3(b)(1), unless the authorization is terminated or revoked.  Performed at [facility], [address] [place], [place] [address]   Culture, blood (routine x 2)     Status: Abnormal   Collection Time: 08/25/20  1:35 PM   Specimen: BLOOD  Result Value Ref Range Status   Specimen Description   Final    BLOOD BLOOD RIGHT FOREARM Performed at [facility], [address], [place], [place] [address]    Special Requests   Final    BOTTLES DRAWN AEROBIC AND ANAEROBIC Blood Culture results may not be optimal due to an excessive volume of blood received in culture bottles Performed at [facility], [address] [place], [place] [address]    Culture  Setup Time   Final    GRAM POSITIVE COCCI IN BOTH AEROBIC AND ANAEROBIC BOTTLES Organism ID to follow CRITICAL  RESULT CALLED TO, READ BACK BY AND VERIFIED WITH: SCOTT HALL $RemoveBe'@0250'[id number]$  08/26/20 RH Performed at [facility], [address] [place], [place] [address]    Culture METHICILLIN RESISTANT STAPHYLOCOCCUS AUREUS (A)  Final   Report Status 08/28/2020 FINAL  Final   Organism ID, Bacteria METHICILLIN RESISTANT STAPHYLOCOCCUS AUREUS  Final      Susceptibility   Methicillin resistant staphylococcus aureus - MIC*    CIPROFLOXACIN <=0.5 SENSITIVE Sensitive     ERYTHROMYCIN <=0.25 SENSITIVE Sensitive     GENTAMICIN <=0.5 SENSITIVE Sensitive     OXACILLIN >=4 RESISTANT Resistant     TETRACYCLINE <=1 SENSITIVE Sensitive     VANCOMYCIN <=0.5 SENSITIVE Sensitive     TRIMETH/SULFA <=10 SENSITIVE Sensitive     CLINDAMYCIN <=0.25 SENSITIVE Sensitive     RIFAMPIN <=0.5 SENSITIVE Sensitive     Inducible Clindamycin NEGATIVE Sensitive     * METHICILLIN RESISTANT STAPHYLOCOCCUS AUREUS  Culture, blood (routine x 2)     Status: None   Collection Time: 08/25/20  1:35 PM   Specimen: BLOOD  Result Value Ref  Range Status   Specimen Description BLOOD BLOOD RIGHT HAND  Final   Special Requests   Final    BOTTLES DRAWN AEROBIC AND ANAEROBIC Blood Culture adequate volume   Culture   Final    NO GROWTH 5 DAYS Performed at [facility], [address] [place], [place] [address]    Report Status 08/30/2020 FINAL  Final  Blood Culture ID Panel (Reflexed)     Status: Abnormal   Collection Time: 08/25/20  1:35 PM  Result Value Ref Range Status   Enterococcus faecalis NOT DETECTED NOT DETECTED Final   Enterococcus Faecium NOT DETECTED NOT DETECTED Final   Listeria monocytogenes NOT DETECTED NOT DETECTED Final   Staphylococcus species DETECTED (A) NOT DETECTED Final    Comment: CRITICAL RESULT CALLED TO, READ BACK BY AND VERIFIED WITH: SCOTT HALL $RemoveBe'@0250'[id number]$  08/26/20 RH    Staphylococcus aureus (BCID) DETECTED (A) NOT DETECTED Final    Comment: Methicillin (oxacillin)-resistant Staphylococcus aureus  (MRSA). MRSA is predictably resistant to beta-lactam antibiotics (except ceftaroline). Preferred therapy is vancomycin unless clinically contraindicated. Patient requires contact precautions if  hospitalized. CRITICAL RESULT CALLED TO, READ BACK BY AND VERIFIED WITH: SCOTT HALL $RemoveBe'@0250'[id number]$  08/26/20 RH    Staphylococcus epidermidis NOT DETECTED NOT DETECTED Final   Staphylococcus lugdunensis NOT DETECTED NOT DETECTED Final   Streptococcus species NOT DETECTED NOT DETECTED Final   Streptococcus agalactiae NOT DETECTED NOT DETECTED Final   Streptococcus pneumoniae NOT DETECTED NOT DETECTED Final   Streptococcus pyogenes NOT DETECTED NOT DETECTED Final   A.calcoaceticus-baumannii NOT DETECTED NOT DETECTED Final   Bacteroides fragilis NOT DETECTED NOT DETECTED Final   Enterobacterales NOT DETECTED NOT DETECTED Final   Enterobacter cloacae complex NOT DETECTED NOT DETECTED Final   Escherichia coli NOT DETECTED NOT DETECTED Final   Klebsiella aerogenes NOT DETECTED NOT DETECTED Final   Klebsiella oxytoca NOT DETECTED NOT DETECTED Final   Klebsiella pneumoniae NOT DETECTED NOT DETECTED Final   Proteus species NOT DETECTED NOT DETECTED Final   Salmonella species NOT DETECTED NOT DETECTED Final   Serratia marcescens NOT DETECTED NOT DETECTED Final   Haemophilus influenzae NOT DETECTED NOT DETECTED Final   Neisseria meningitidis NOT DETECTED NOT DETECTED Final   Pseudomonas aeruginosa NOT DETECTED NOT DETECTED Final   Stenotrophomonas maltophilia NOT DETECTED NOT DETECTED Final   Candida albicans NOT DETECTED NOT DETECTED Final   Candida auris NOT DETECTED NOT DETECTED Final   Candida glabrata NOT DETECTED NOT DETECTED Final   Candida krusei NOT DETECTED NOT DETECTED Final   Candida parapsilosis NOT DETECTED NOT DETECTED Final   Candida tropicalis NOT DETECTED NOT DETECTED Final   Cryptococcus neoformans/gattii NOT DETECTED NOT DETECTED Final   Meth resistant mecA/C and MREJ DETECTED (A) NOT DETECTED  Final    Comment: CRITICAL RESULT CALLED TO, READ BACK BY AND VERIFIED WITH: SCOTT HALL $RemoveBe'@0250'[id number]$  08/26/20 RH Performed at [facility], [address] [place], [place] [address]   Surgical PCR screen     Status: Abnormal   Collection Time: 08/26/20  5:27 AM   Specimen: Nasal Mucosa; Nasal Swab  Result Value Ref Range Status   MRSA, PCR POSITIVE (A) NEGATIVE Final    Comment: RESULT CALLED TO, READ BACK BY AND VERIFIED WITH: CANDACE SUMMERS AT 2536 08/26/20 SDR    Staphylococcus aureus POSITIVE (A) NEGATIVE Final    Comment: (NOTE) The Xpert SA Assay (FDA approved for NASAL specimens in patients 17 years of age and older), is one component of a comprehensive surveillance program. It  is not intended to diagnose infection nor to guide or monitor treatment. Performed at [facility], [address] [place], [place] [address]   Anaerobic culture     Status: None   Collection Time: 08/26/20  5:27 PM   Specimen: PATH Bone biopsy; Tissue  Result Value Ref Range Status   Specimen Description   Final    BONE R DISTAL PHALANX BONE CULTURE Performed at [facility], [address], [place], [place] [address]    Special Requests   Final    NONE Performed at [facility], [address] [place], [place] [address]    Gram Stain   Final    MODERATE WBC PRESENT, PREDOMINANTLY PMN ABUNDANT GRAM POSITIVE COCCI    Culture   Final    ABUNDANT METHICILLIN RESISTANT STAPHYLOCOCCUS AUREUS NO ANAEROBES ISOLATED Performed at [facility], [place] [address], [place], [place] [address]    Report Status 08/31/2020 FINAL  Final   Organism ID, Bacteria METHICILLIN RESISTANT STAPHYLOCOCCUS AUREUS  Final      Susceptibility   Methicillin resistant staphylococcus aureus - MIC*    CIPROFLOXACIN <=0.5 SENSITIVE Sensitive     ERYTHROMYCIN <=0.25 SENSITIVE Sensitive     GENTAMICIN <=0.5 SENSITIVE Sensitive     OXACILLIN >=4 RESISTANT Resistant     TETRACYCLINE  <=1 SENSITIVE Sensitive     VANCOMYCIN 1 SENSITIVE Sensitive     TRIMETH/SULFA <=10 SENSITIVE Sensitive     CLINDAMYCIN <=0.25 SENSITIVE Sensitive     RIFAMPIN <=0.5 SENSITIVE Sensitive     Inducible Clindamycin NEGATIVE Sensitive     * ABUNDANT METHICILLIN RESISTANT STAPHYLOCOCCUS AUREUS  CULTURE, BLOOD (ROUTINE X 2) w Reflex to ID Panel     Status: None   Collection Time: 08/28/20 11:36 AM   Specimen: BLOOD  Result Value Ref Range Status   Specimen Description BLOOD RIGHT [facility]  Final   Special Requests   Final    BOTTLES DRAWN AEROBIC AND ANAEROBIC Blood Culture adequate volume   Culture   Final    NO GROWTH 5 DAYS Performed at [facility], [address] [place], [place] [address]    Report Status 09/02/2020 FINAL  Final  CULTURE, BLOOD (ROUTINE X 2) w Reflex to ID Panel     Status: None   Collection Time: 08/28/20 11:42 AM   Specimen: BLOOD  Result Value Ref Range Status   Specimen Description BLOOD RIGHT HAND  Final   Special Requests   Final    BOTTLES DRAWN AEROBIC AND ANAEROBIC Blood Culture adequate volume   Culture   Final    NO GROWTH 5 DAYS Performed at [facility], [address], [place], [place] [address]    Report Status 09/02/2020 FINAL  Final    Coagulation Studies: No results for input(s): LABPROT, INR in the last 72 hours.  Urinalysis: No results for input(s): COLORURINE, LABSPEC, PHURINE, GLUCOSEU, HGBUR, BILIRUBINUR, KETONESUR, PROTEINUR, UROBILINOGEN, NITRITE, LEUKOCYTESUR in the last 72 hours.  Invalid input(s): APPERANCEUR    Imaging: DG Chest Port 1 View  Result Date: 10/06/2020 CLINICAL DATA:  Short of breath.  COVID 19. EXAM: PORTABLE CHEST 1 VIEW COMPARISON:  09/23/2020 FINDINGS: Midline trachea. Mild cardiomegaly, accentuated by AP portable technique. Possible layering bilateral pleural effusions. No pneumothorax. Worsened aeration, with right greater than left and lower lung predominant interstitial and airspace disease  bilaterally. IMPRESSION: Lower lung predominant interstitial and airspace disease is progressive compared to 09/23/2020. Given the clinical history, at least partially  secondary to COVID 19 pneumonia. A component of concurrent congestive heart failure is suspected. Electronically Signed   By: Abigail Miyamoto M.D.   On: 10/06/2020 11:22     Medications:    . sodium chloride   Intravenous Once  . amLODipine  10 mg Oral Daily  . Chlorhexidine Gluconate Cloth  6 each Topical Q0600  . famotidine  20 mg Oral Daily  . furosemide  60 mg Intravenous Once  . hydrALAZINE  25 mg Oral Q8H  . lisinopril  20 mg Oral Daily  . metoprolol tartrate  50 mg Oral BID   acetaminophen **OR** acetaminophen, albuterol, benzonatate, metoprolol tartrate, ondansetron **OR** ondansetron (ZOFRAN) IV  Assessment/ Plan:  [name] is a 60 y.o. black male with end stage renal disease on hemodialysis, hypertension, sleep apnea, diabetes mellitus type II, chronic back pain, right toe amputation who is admitted to [facility] on 10/06/2020 for Acute hypoxemic respiratory failure ([place]) [J96.01]  [facility] [place] Left AVF 115.5kg  #.  End-stage renal disease, shortness of breath due to acute pulmonary edema  Plan for repeat urgent hemodialysis today for volume removal Ultrafiltration goal 2.5 to 3 L as tolerated  #. Anemia with chronic kidney disease:   Lab Results  Component Value Date   HGB 6.2 (L) 10/07/2020  Blood transfusion planned during dialysis.  Ordered by hospitalist team. -Epogen with hemodialysis  #Recent history of GI bleed EGD done on January 20 showed diffuse moderately erythematous mucosa without active bleeding.  Colonoscopy recommended as outpatient  #. Secondary Hyperparathyroidism:  Previously was on calcium acetate 2 tablets p.o. 3 times daily with meals. Monitor phos   LOS: 0 Steve Vance 2/1/202210:35 AM

## 2020-10-07 NOTE — ED Notes (Signed)
Respiratory at bedside.

## 2020-10-07 NOTE — ED Notes (Signed)
Pt back in bed. Pt laying and has cardiac, bp and pulse ox monitor in place.

## 2020-10-07 NOTE — ED Notes (Signed)
Patient sitting on side of bed. No signs of distress. Respirations even and unlabored. Will continue to monitor.

## 2020-10-07 NOTE — Progress Notes (Addendum)
[place] [facility] [place] NAME: Steve Vance    MR#:  [id number]  DATE OF BIRTH:  23-Apr-1961  SUBJECTIVE:  seen in the emergency room. Patient sitting at the edge of the bed. Says his feeling little better. On heated high flow nasal cannula. Came in with worsening shortness of breath. Had to be on BiPAP did not tolerate well. Underwent emergent dialysis for ultrafiltration. Eating well. Denies any chest pain. REVIEW OF SYSTEMS:   Review of Systems  Constitutional: Negative for chills, fever and weight loss.  HENT: Negative for ear discharge, ear pain and nosebleeds.   Eyes: Negative for blurred vision, pain and discharge.  Respiratory: Positive for shortness of breath. Negative for sputum production, wheezing and stridor.   Cardiovascular: Negative for chest pain, palpitations, orthopnea and PND.  Gastrointestinal: Negative for abdominal pain, diarrhea, nausea and vomiting.  Genitourinary: Negative for frequency and urgency.  Musculoskeletal: Negative for back pain and joint pain.  Neurological: Negative for sensory change, speech change, focal weakness and weakness.  Psychiatric/Behavioral: Negative for depression and hallucinations. The patient is not nervous/anxious.    Tolerating Diet:yes Tolerating PT:   DRUG ALLERGIES:   Allergies  Allergen Reactions  . Baclofen Nausea And Vomiting  . Gabapentin Nausea And Vomiting    VITALS:  Blood pressure (!) 151/82, pulse 80, temperature 98.2 F (36.8 C), temperature source Oral, resp. rate (!) 24, height $RemoveBe'5\' 11"'HAIroCMdG$  (1.803 m), weight 108.9 kg, SpO2 100 %.  PHYSICAL EXAMINATION:   Physical Exam  GENERAL:  60 y.o.-year-old patient lying in the bed with no acute distress. Obesity HEENT: Head atraumatic, normocephalic. Oropharynx and nasopharynx clear.  LUNGS: diminished breath sounds bilaterally, no wheezing, rales, rhonchi. No use of accessory muscles of respiration.  CARDIOVASCULAR: S1, S2  normal. No murmurs, rubs, or gallops. Mild tachy ABDOMEN: Soft, nontender, nondistended. Abdominal obesity EXTREMITIES: No cyanosis, clubbing ++ edema b/l.    NEUROLOGIC: Cranial nerves II through XII are intact. No focal Motor or sensory deficits b/l.   PSYCHIATRIC:  patient is alert and oriented x 3.  SKIN: No obvious rash, lesion, or ulcer.   LABORATORY PANEL:  CBC Recent Labs  Lab 10/07/20 0630  WBC 8.4  HGB 6.2*  HCT 19.7*  PLT 241    Chemistries  Recent Labs  Lab 10/07/20 0630  NA 136  K 3.9  CL 97*  CO2 28  GLUCOSE 109*  BUN 24*  CREATININE 5.69*  CALCIUM 9.0  AST 14*  ALT 10  ALKPHOS 53  BILITOT 0.7   Cardiac Enzymes No results for input(s): TROPONINI in the last 168 hours. RADIOLOGY:  DG Chest Port 1 View  Result Date: 10/06/2020 CLINICAL DATA:  Short of breath.  COVID 19. EXAM: PORTABLE CHEST 1 VIEW COMPARISON:  09/23/2020 FINDINGS: Midline trachea. Mild cardiomegaly, accentuated by AP portable technique. Possible layering bilateral pleural effusions. No pneumothorax. Worsened aeration, with right greater than left and lower lung predominant interstitial and airspace disease bilaterally. IMPRESSION: Lower lung predominant interstitial and airspace disease is progressive compared to 09/23/2020. Given the clinical history, at least partially secondary to COVID 19 pneumonia. A component of concurrent congestive heart failure is suspected. Electronically Signed   By: Abigail Miyamoto M.D.   On: 10/06/2020 11:22   ASSESSMENT AND PLAN:   Steve Vance is a 60 y.o. male with medical history significant for end-stage renal disease on hemodialysis Tuesday Thursday Saturday, hypertension, recent Covid infection on 09/09/2020 status post treatment, obesity, presents emergency department  for chief concerns of worsening shortness of breath x2-3 days. He states that shortness of breath has been ongoing for the last 2 to 3 weeks.  He endorses that he had a complete dialysis session  on 10/04/2020.    Acute hypoxic respiratory secondary to volume overload/pulmonary edema  and anemia -BIPAP ordered prn at bedside -Nephrology has been consulted for emergent hemodialysis-- ultrafiltration of 2900 mL done yesterday -- Pt is currently on heated high flow oxygen. His sitting at the edge of the bed in the ER. He said he feels little better. --pt had been drinking a lot of sodas over the weekend!  End-stage renal disease on hemodialysis-nephrology has been consulted, Dr. Candiss Norse -Patient completed treatment fully per patient on 10/04/2020  Anemia of chronic disease - no signs of active bleeding at this time -baseline hemoglobin around 7.8 -- patient came in with hemoglobin of 6.3-- one unit blood transfusion-- 6.2 -- will discussed with nephrology if patient is gonna get dialysis today in order to get another unit. -- Recent EGD in January 2022 showed gastropathy.Small hiatal hernia. - Salmon-colored mucosa suspicious for short-segment Barrett's esophagus and classified as Barrett's stage C0-M1 per [place] criteria. Biopsied. - A single papule (nodule) found in the stomach. Biopsied. - Erythematous mucosa in the antrum. Biopsied. - Erythematous duodenopathy. - A single duodenal polyp.  Patient is supposed to get colonoscopy as outpatient.  Type II diabetes with hyperlipidemia -sugars are stable. Will continue to monitor with sliding scale.  Hypertension -- continue beta-blockers and hydralazine  DVT prophylaxis: holding due to anemia  Code Status: DNR Diet:  renal Family Communication: no Disposition Plan: pending clinical course Consults called: nephrology  Level of care: Progressive Cardiac Status is: Inpatient  Remains inpatient appropriate because: acute hypoxic respiratory failure secondary to pulmonary edema currently on heated high flow oxygen   Dispo: The patient is from: Home              Anticipated d/c is to: Home              Anticipated d/c date  is: 2 days              Patient currently is not medically stable to d/c.   Difficult to place patient No        TOTAL TIME TAKING CARE OF THIS PATIENT: 25 minutes.  >50% time spent on counselling and coordination of care  Note: This dictation was prepared with Dragon dictation along with smaller phrase technology. Any transcriptional errors that result from this process are unintentional.  Fritzi Mandes M.D    [facility]   CC: Primary care physician; Theotis Burrow, MDPatient ID: [name], male   DOB: Sep 04, 1961, 60 y.o.   MRN: [id number]

## 2020-10-08 ENCOUNTER — Encounter: Payer: Self-pay | Admitting: Internal Medicine

## 2020-10-08 ENCOUNTER — Inpatient Hospital Stay: Payer: Medicare HMO

## 2020-10-08 LAB — BLOOD GAS, VENOUS
Acid-Base Excess: 6.6 mmol/L — ABNORMAL HIGH (ref 0.0–2.0)
Bicarbonate: 31.8 mmol/L — ABNORMAL HIGH (ref 20.0–28.0)
O2 Saturation: 92.2 %
Patient temperature: 37
pCO2, Ven: 49 mmHg (ref 44.0–60.0)
pH, Ven: 7.42 (ref 7.250–7.430)
pO2, Ven: 63 mmHg — ABNORMAL HIGH (ref 32.0–45.0)

## 2020-10-08 LAB — BPAM RBC
Blood Product Expiration Date: 202202012359
Blood Product Expiration Date: 202203022359
ISSUE DATE / TIME: 202201311619
ISSUE DATE / TIME: 202202011608
Unit Type and Rh: 6200
Unit Type and Rh: 9500

## 2020-10-08 LAB — BASIC METABOLIC PANEL
Anion gap: 11 (ref 5–15)
BUN: 28 mg/dL — ABNORMAL HIGH (ref 8–23)
CO2: 27 mmol/L (ref 22–32)
Calcium: 9.2 mg/dL (ref 8.9–10.3)
Chloride: 97 mmol/L — ABNORMAL LOW (ref 98–111)
Creatinine, Ser: 5.65 mg/dL — ABNORMAL HIGH (ref 0.61–1.24)
GFR, Estimated: 11 mL/min — ABNORMAL LOW (ref >60)
Glucose, Bld: 144 mg/dL — ABNORMAL HIGH (ref 70–99)
Potassium: 3.8 mmol/L (ref 3.5–5.1)
Sodium: 135 mmol/L (ref 135–145)

## 2020-10-08 LAB — TYPE AND SCREEN
ABO/RH(D): AB POS
Antibody Screen: NEGATIVE
Unit division: 0
Unit division: 0

## 2020-10-08 LAB — CBC
HCT: 23.6 % — ABNORMAL LOW (ref 39.0–52.0)
Hemoglobin: 7.6 g/dL — ABNORMAL LOW (ref 13.0–17.0)
MCH: 28.6 pg (ref 26.0–34.0)
MCHC: 32.2 g/dL (ref 30.0–36.0)
MCV: 88.7 fL (ref 80.0–100.0)
Platelets: 237 10*3/uL (ref 150–400)
RBC: 2.66 MIL/uL — ABNORMAL LOW (ref 4.22–5.81)
RDW: 16.6 % — ABNORMAL HIGH (ref 11.5–15.5)
WBC: 8.2 10*3/uL (ref 4.0–10.5)
nRBC: 0 % (ref 0.0–0.2)

## 2020-10-08 LAB — MRSA PCR SCREENING: MRSA by PCR: NEGATIVE

## 2020-10-08 IMAGING — US US EXTREM LOW VENOUS
1 series · 13 of 24 positions shown · non-contrast
Comparison: None.

CLINICAL DATA: Leg swelling

EXAM:
BILATERAL LOWER EXTREMITY VENOUS DOPPLER ULTRASOUND
TECHNIQUE: Gray-scale sonography with compression, as well as color and duplex
ultrasound, were performed to evaluate the deep venous system(s)
from the level of the common femoral vein through the popliteal and
proximal calf veins.

[Series 1: us extrem low venous · 0.07mm/px · 13 of 79 slices shown]
[im 1/79]
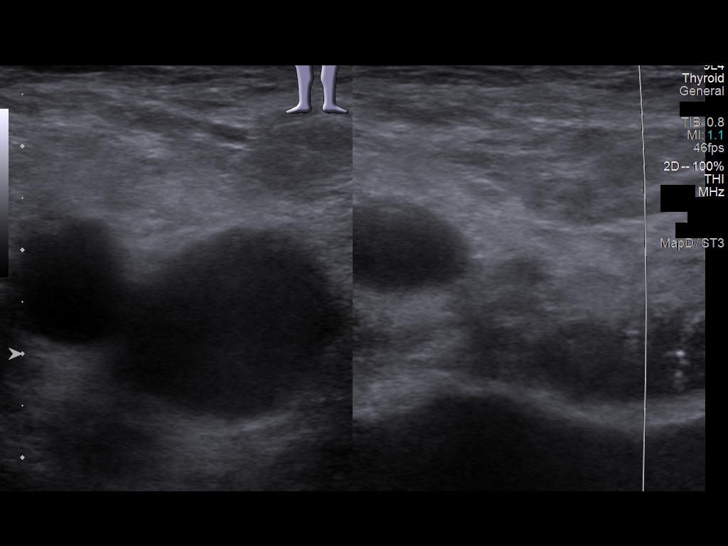
[im 7/79]
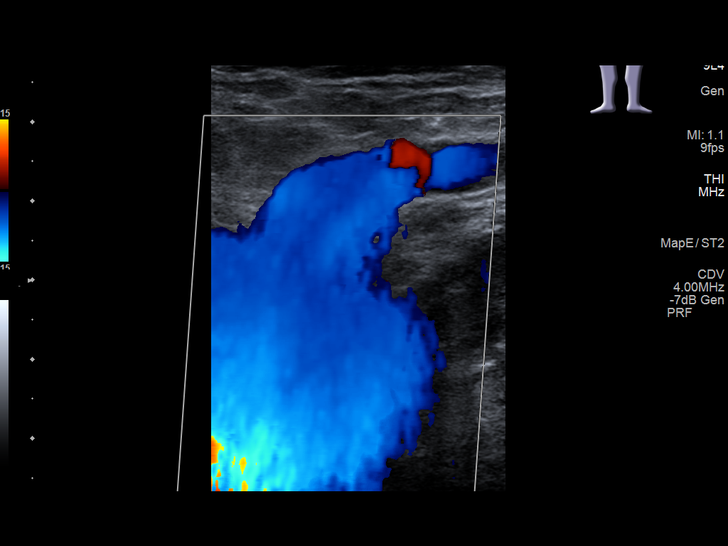
[im 14/79]
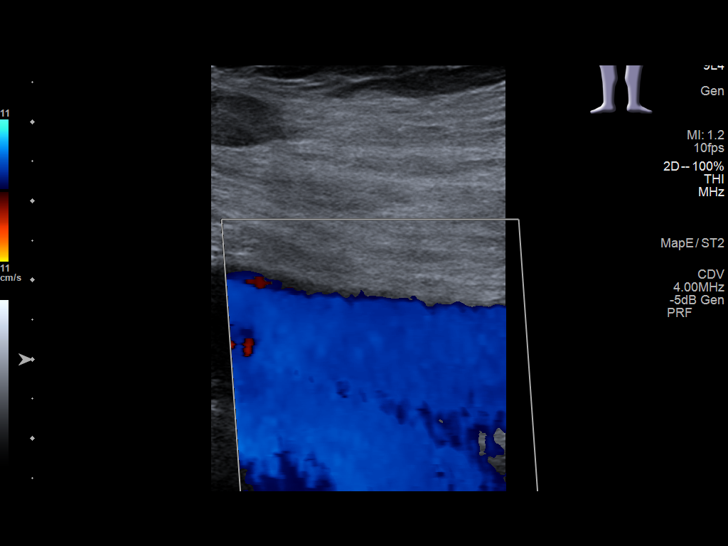
[im 21/79]
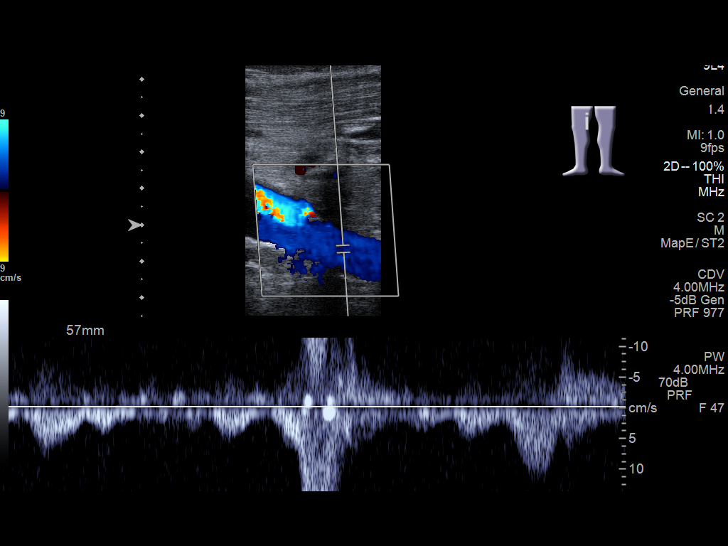
[im 28/79]
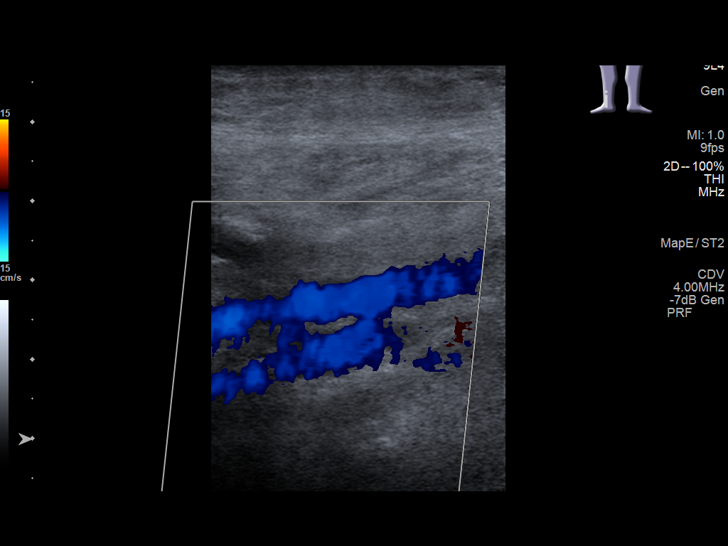
[im 34/79]
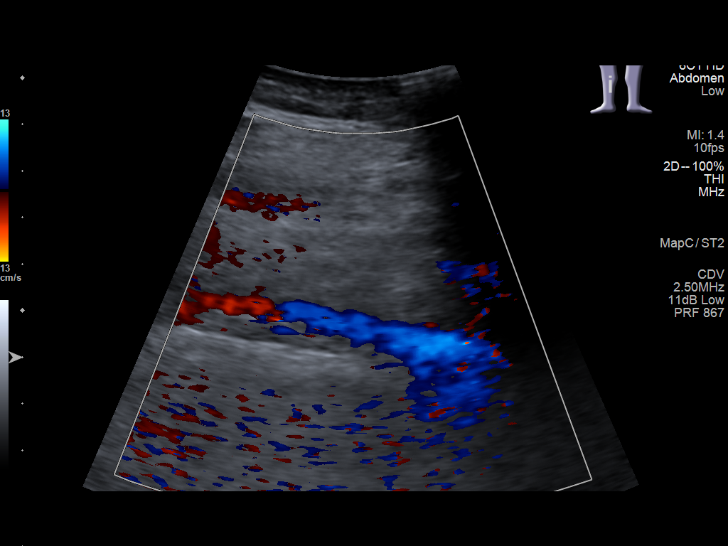
[im 41/79]
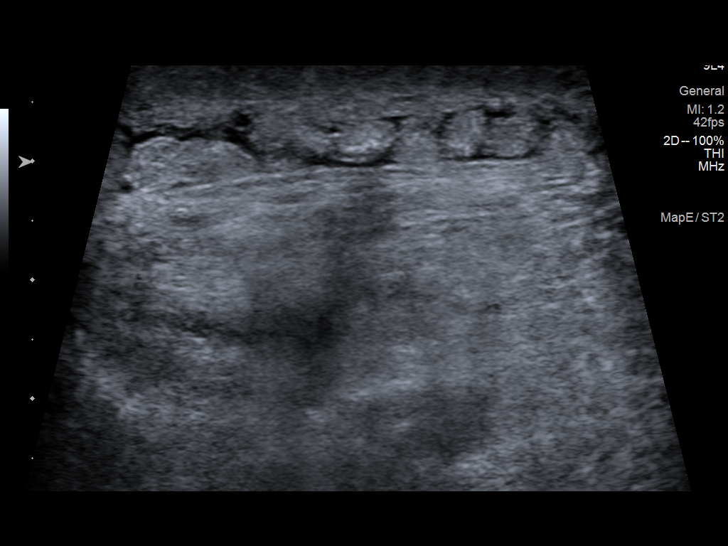
[im 45/79]
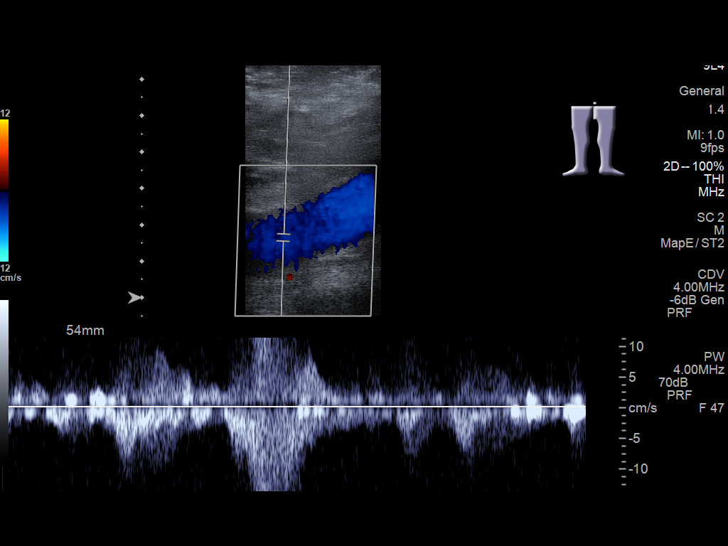
[im 51/79]
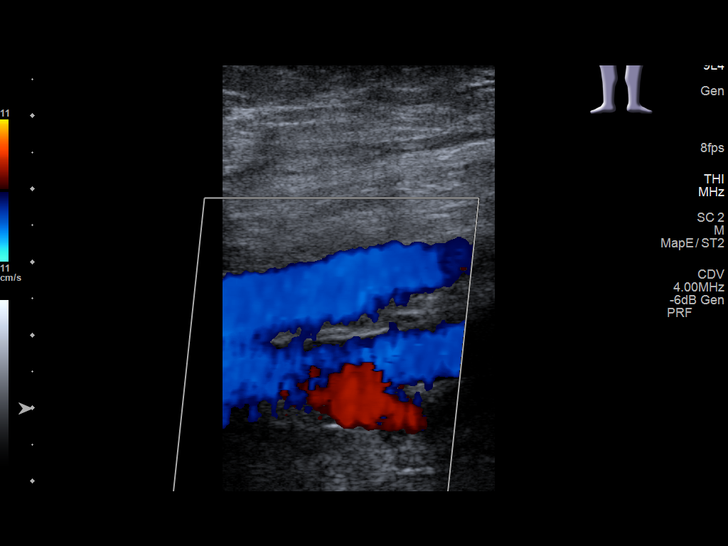
[im 58/79]
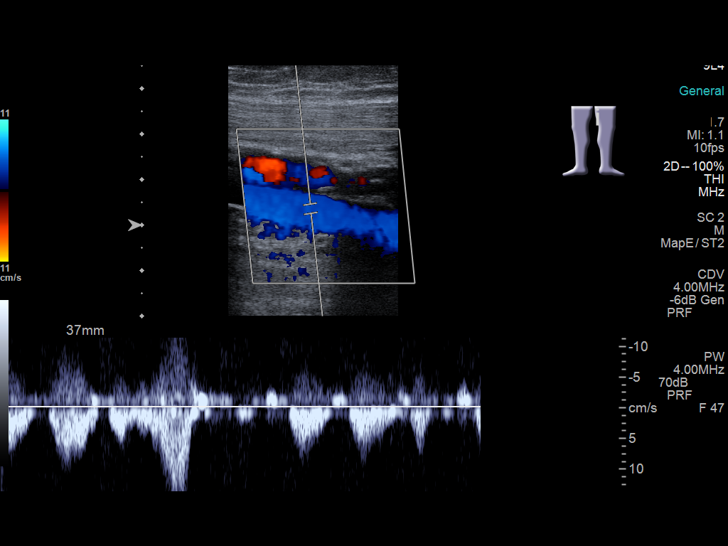
[im 65/79]
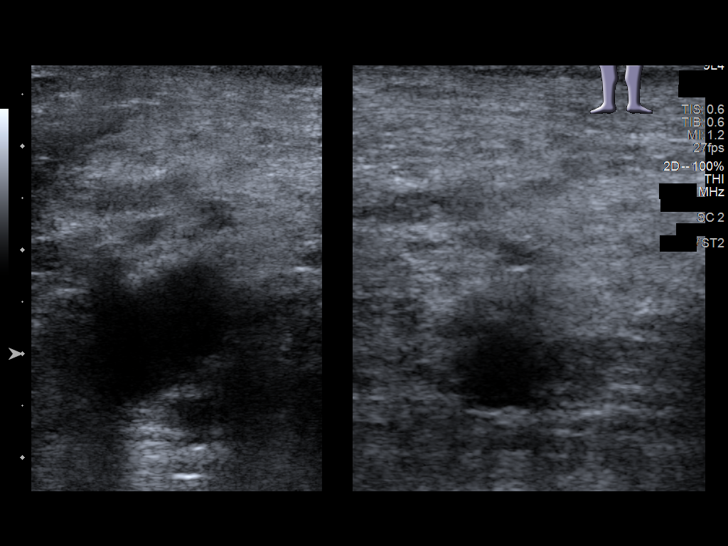
[im 72/79]
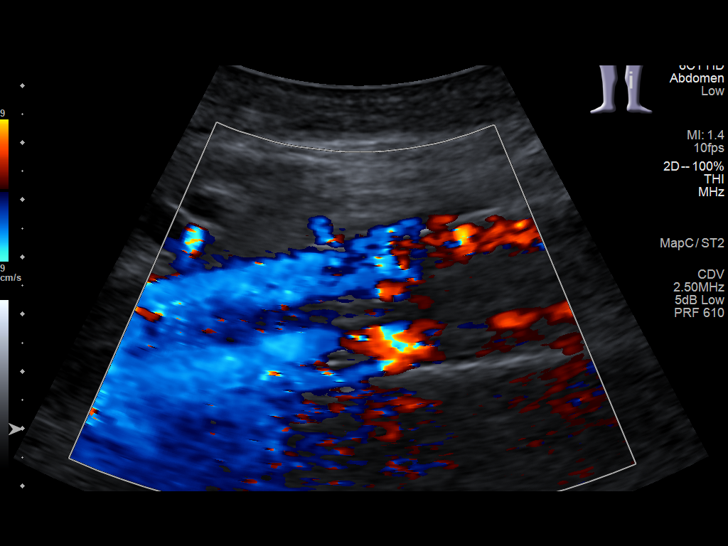
[im 79/79]
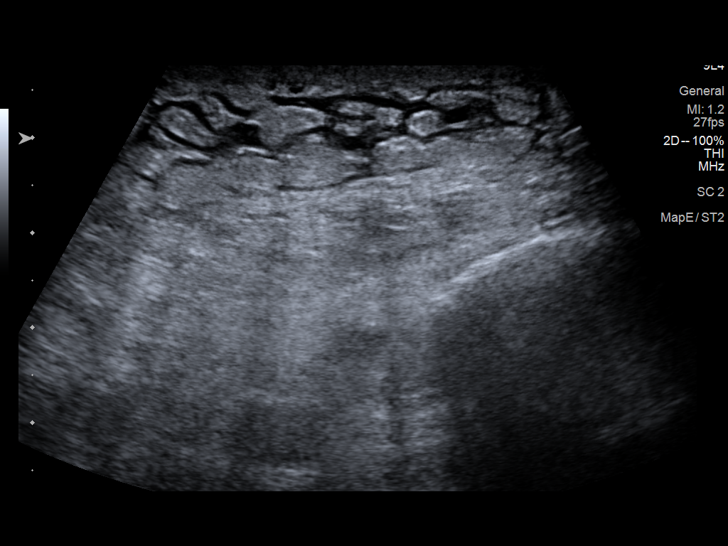

[13 of 24 positions shown; findings below may reference images not displayed]

FINDINGS: VENOUS

Normal compressibility of the common femoral, superficial femoral,
and popliteal veins, as well as the visualized calf veins. The basis
[place] bilaterally is somewhat hyperdynamic. Visualized portions of
profunda femoral vein and great saphenous vein unremarkable. No
filling defects to suggest DVT on grayscale or color Doppler
imaging. Doppler waveforms show normal direction of venous flow,
normal respiratory plasticity and response to augmentation.

OTHER

There is nonspecific lower extremity edema bilaterally. Mildly
enlarged but morphologically normal inguinal lymph nodes are noted.

Limitations: none
IMPRESSION: 1. No DVT.
2. Hyperdynamic waveform suggestive of right-sided heart failure.
3. There is nonspecific bilateral lower extremity edema.
4. Mildly enlarged but morphologically normal inguinal lymph nodes.

## 2020-10-08 IMAGING — DX DG CHEST 1V PORT
1 series · 1 of 1 positions shown · non-contrast
Comparison: [DATE].

CLINICAL DATA: Shortness of breath.  COVID positive.

EXAM:
PORTABLE CHEST 1 VIEW

[chest ap]
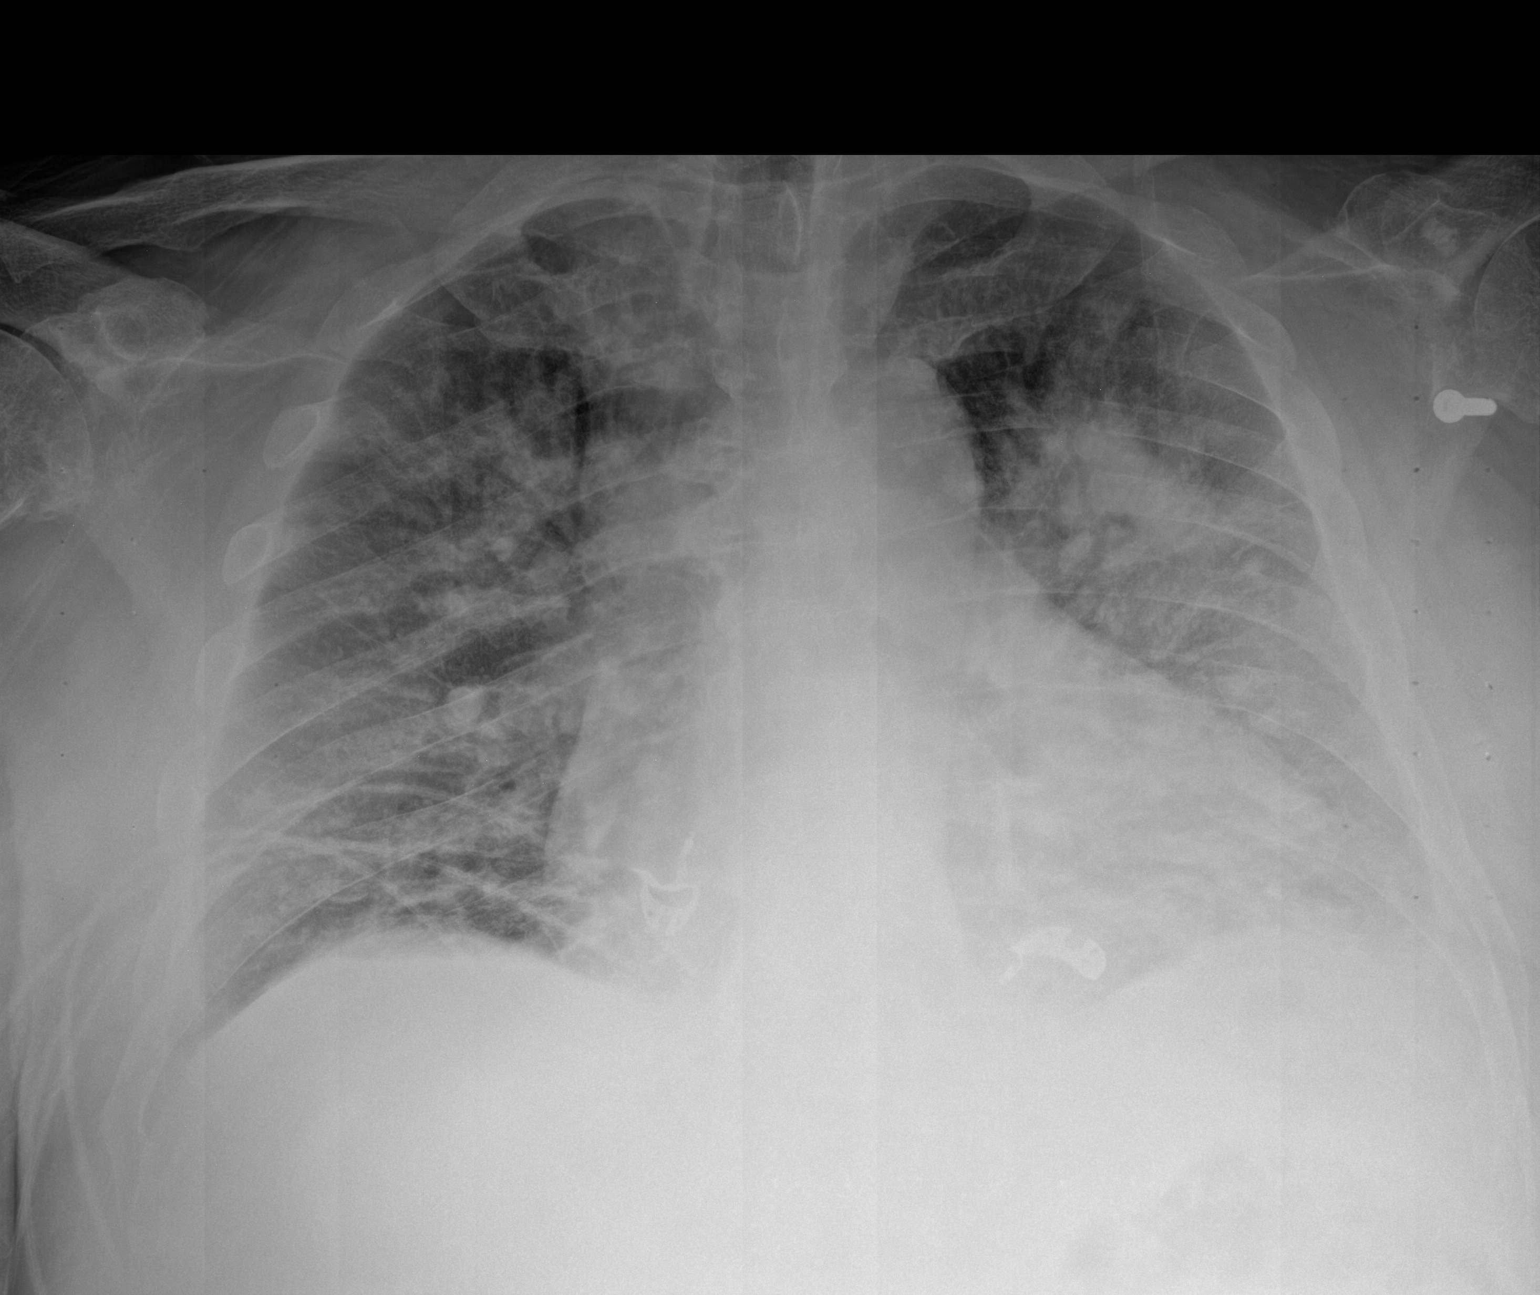

[1 of 1 positions shown; findings below may reference images not displayed]

FINDINGS: Cardiomegaly again noted. Diffuse severe bilateral pulmonary
infiltrates/edema again noted. Progressive infiltrate/edema in the
left upper lung. Small bilateral pleural effusions can not be
excluded. No pneumothorax.
IMPRESSION: Cardiomegaly again noted. Diffuse severe bilateral pulmonary
infiltrates/edema again noted. Progressive infiltrate/edema left
upper lung. Small bilateral pleural effusions.

## 2020-10-08 MED ORDER — HEPARIN SODIUM (PORCINE) 5000 UNIT/ML IJ SOLN
5000.0000 [IU] | Freq: Three times a day (TID) | INTRAMUSCULAR | Status: DC
  Administered 2020-10-08 – 2020-10-10 (×5): 5000 [IU] via SUBCUTANEOUS
  Filled 2020-10-08 (×6): qty 1

## 2020-10-08 MED ORDER — DICLOFENAC SODIUM 1 % EX GEL
2.0000 g | Freq: Four times a day (QID) | CUTANEOUS | Status: DC | PRN
  Administered 2020-10-08: 10:00:00 2 g via TOPICAL
  Filled 2020-10-08: qty 100

## 2020-10-08 NOTE — Progress Notes (Signed)
[facility]  ROUNDING NOTE   Subjective:    presented to ER for SOB that worsened over last 2 days. Covid + on Jan 4 Significantly short of breath when seen Patient underwent emergency hemodialysis yesterday.  2900 cc of fluid was removed day before yesterday and 3500 cc removed yesterday.  He continues to require high flow oxygen today.  He still has some cough and continues to have significant lower extremity edema Able to eat without nausea or vomiting   Objective:  Vital signs in last 24 hours:  Temp:  [98.1 F (36.7 C)-99.3 F (37.4 C)] 98.4 F (36.9 C) (02/02 1137) Pulse Rate:  [78-117] 78 (02/02 1137) Resp:  [18-40] 18 (02/02 1137) BP: (142-245)/(66-136) 143/71 (02/02 1137) SpO2:  [88 %-100 %] 100 % (02/02 1454) FiO2 (%):  [50 %-80 %] 50 % (02/02 1410) Weight:  [660 kg] 114 kg (02/02 0353)  Weight change: 5.126 kg Filed Weights   10/06/20 1024 10/08/20 0353  Weight: 108.9 kg 114 kg    Intake/Output: I/O last 3 completed shifts: In: 6301 [P.O.:480; Blood:791] Out: 6403 [SWFUX:3235]   Intake/Output this shift:  Total I/O In: 480 [P.O.:480] Out: 200 [Urine:200]  Physical Exam: General: NAD, sitting up on the edge of bed  Head: Normocephalic, atraumatic. Moist oral mucosal membranes  Lungs:   High flow nasal cannula oxygen, mild basilar crackles  Heart: Regular, tachycardic  Abdomen:  Soft, nontender  Extremities: 2+ peripheral edema.  Neurologic: Nonfocal, moving all four extremities  Skin: No lesions  Access: Left AVF    Basic Metabolic Panel: Recent Labs  Lab 10/06/20 1029 10/06/20 1211 10/07/20 0630 10/08/20 1105  NA 137  --  136 135  K 3.8  --  3.9 3.8  CL 98  --  97* 97*  CO2 26  --  28 27  GLUCOSE 94  --  109* 144*  BUN 29*  --  24* 28*  CREATININE 7.13*  --  5.69* 5.65*  CALCIUM 9.3  --  9.0 9.2  PHOS  --  5.3*  --   --     Liver Function Tests: Recent Labs  Lab 10/06/20 1029 10/07/20 0630  AST 16 14*  ALT 9 10   ALKPHOS 55 53  BILITOT 0.8 0.7  PROT 8.0 7.7  ALBUMIN 3.3* 3.2*   No results for input(s): LIPASE, AMYLASE in the last 168 hours. No results for input(s): AMMONIA in the last 168 hours.  CBC: Recent Labs  Lab 10/06/20 1029 10/07/20 0630 10/08/20 1105  WBC 6.9 8.4 8.2  NEUTROABS 4.6  --   --   HGB 6.3* 6.2* 7.6*  HCT 20.5* 19.7* 23.6*  MCV 89.5 88.7 88.7  PLT 225 241 237    Cardiac Enzymes: No results for input(s): CKTOTAL, CKMB, CKMBINDEX, TROPONINI in the last 168 hours.  BNP: Invalid input(s): POCBNP  CBG: Recent Labs  Lab 10/07/20 2028  GLUCAP 106*    Microbiology: Results for orders placed or performed during the hospital encounter of 10/06/20  MRSA PCR Screening     Status: None   Collection Time: 10/08/20  6:10 AM   Specimen: Nasal Mucosa; Nasopharyngeal  Result Value Ref Range Status   MRSA by PCR NEGATIVE NEGATIVE Final    Comment:        The GeneXpert MRSA Assay (FDA approved for NASAL specimens only), is one component of a comprehensive MRSA colonization surveillance program. It is not intended to diagnose MRSA infection nor to guide or monitor treatment  for MRSA infections. Performed at [facility], [address] [place], [place] [address]     Coagulation Studies: No results for input(s): LABPROT, INR in the last 72 hours.  Urinalysis: No results for input(s): COLORURINE, LABSPEC, PHURINE, GLUCOSEU, HGBUR, BILIRUBINUR, KETONESUR, PROTEINUR, UROBILINOGEN, NITRITE, LEUKOCYTESUR in the last 72 hours.  Invalid input(s): APPERANCEUR    Imaging: DG Chest Port 1 View  Result Date: 10/08/2020 CLINICAL DATA:  Shortness of breath.  COVID positive. EXAM: PORTABLE CHEST 1 VIEW COMPARISON:  10/06/2020. FINDINGS: Cardiomegaly again noted. Diffuse severe bilateral pulmonary infiltrates/edema again noted. Progressive infiltrate/edema in the left upper lung. Small bilateral pleural effusions can not be excluded. No pneumothorax. IMPRESSION:  Cardiomegaly again noted. Diffuse severe bilateral pulmonary infiltrates/edema again noted. Progressive infiltrate/edema left upper lung. Small bilateral pleural effusions. Electronically Signed   By: Marcello Moores  Register   On: 10/08/2020 09:07     Medications:    . sodium chloride   Intravenous Once  . sodium chloride   Intravenous Once  . amLODipine  10 mg Oral Daily  . Chlorhexidine Gluconate Cloth  6 each Topical Q0600  . famotidine  10 mg Oral Daily  . heparin  5,000 Units Subcutaneous Q8H  . hydrALAZINE  25 mg Oral Q8H  . lisinopril  20 mg Oral Daily  . metoprolol tartrate  50 mg Oral BID   acetaminophen **OR** acetaminophen, albuterol, benzonatate, diclofenac Sodium, ondansetron **OR** ondansetron (ZOFRAN) IV  Assessment/ Plan:  Mr. Kristof Nadeem is a 60 y.o. black male with end stage renal disease on hemodialysis, hypertension, sleep apnea, diabetes mellitus type II, chronic back pain, right toe amputation who is admitted to [facility] on 10/06/2020 for Acute pulmonary edema ([place]) [J81.0] SOB (shortness of breath) [R06.02] Acute respiratory failure with hypoxia ([place]) [J96.01] Acute hypoxemic respiratory failure ([place]) [J96.01]  [facility] TTS [place] Left AVF 115.5kg  #.  End-stage renal disease, shortness of breath due to acute pulmonary edema  October 06 2898 cc removed October 07 3498 cc removed Next hemodialysis planned for Thursday  #. Anemia with chronic kidney disease:   Lab Results  Component Value Date   HGB 7.6 (L) 10/08/2020  Blood transfusion given during dialysis.   -Epogen with hemodialysis  #Recent history of GI bleed EGD done on January 20 showed diffuse moderately erythematous mucosa without active bleeding.  Colonoscopy recommended as outpatient  #. Secondary Hyperparathyroidism:  Previously was on calcium acetate 2 tablets p.o. 3 times daily with meals. Monitor phos   LOS: 1 Syesha Thaw 2/2/20223:41 PM

## 2020-10-08 NOTE — Progress Notes (Addendum)
[place] [facility] [place] NAME: Steve Vance    MR#:  [id number]  DATE OF BIRTH:  [birth year]  SUBJECTIVE:  This morning sitting up in bed. Alert, says feeling well. No tachypnea but says is somewhat sob when moves around. No chest pain. Has appetite REVIEW OF SYSTEMS:   Review of Systems  Constitutional: Negative for chills, fever and weight loss.  HENT: Negative for ear discharge, ear pain and nosebleeds.   Eyes: Negative for blurred vision, pain and discharge.  Respiratory: Positive for shortness of breath. Negative for sputum production, wheezing and stridor.   Cardiovascular: Negative for chest pain, palpitations, orthopnea and PND.  Gastrointestinal: Negative for abdominal pain, diarrhea, nausea and vomiting.  Genitourinary: Negative for frequency and urgency.  Musculoskeletal: Negative for back pain and joint pain.  Neurological: Negative for sensory change, speech change, focal weakness and weakness.  Psychiatric/Behavioral: Negative for depression and hallucinations. The patient is not nervous/anxious.    Tolerating Diet:yes Tolerating PT:   DRUG ALLERGIES:   Allergies  Allergen Reactions  . Baclofen Nausea And Vomiting  . Gabapentin Nausea And Vomiting    VITALS:  Blood pressure (!) 143/71, pulse 78, temperature 98.4 F (36.9 C), temperature source Oral, resp. rate 18, height $RemoveBe'5\' 11"'HuKZvnqkk$  (1.803 m), weight 114 kg, SpO2 100 %.  PHYSICAL EXAMINATION:   Physical Exam  GENERAL:  60 y.o.-year-old patient sitting up in the bed with no acute distress. Obesity HEENT: Head atraumatic, normocephalic. Oropharynx and nasopharynx clear.  LUNGS: diminished breath sounds bilaterally, faint exp wheeze CARDIOVASCULAR: S1, S2 normal. Distant heart sounds ABDOMEN: Soft, nontender, obese.  EXTREMITIES: No cyanosis, clubbing. 2+ LE edema NEUROLOGIC: moving all 4 extremities equally PSYCHIATRIC:  patient is alert and oriented x 3.  SKIN: No  obvious rash, lesion, or ulcer.   LABORATORY PANEL:  CBC Recent Labs  Lab 10/08/20 1105  WBC 8.2  HGB 7.6*  HCT 23.6*  PLT 237    Chemistries  Recent Labs  Lab 10/07/20 0630 10/08/20 1105  NA 136 135  K 3.9 3.8  CL 97* 97*  CO2 28 27  GLUCOSE 109* 144*  BUN 24* 28*  CREATININE 5.69* 5.65*  CALCIUM 9.0 9.2  AST 14*  --   ALT 10  --   ALKPHOS 53  --   BILITOT 0.7  --    Cardiac Enzymes No results for input(s): TROPONINI in the last 168 hours. RADIOLOGY:  DG Chest Port 1 View  Result Date: 10/08/2020 CLINICAL DATA:  Shortness of breath.  COVID positive. EXAM: PORTABLE CHEST 1 VIEW COMPARISON:  10/06/2020. FINDINGS: Cardiomegaly again noted. Diffuse severe bilateral pulmonary infiltrates/edema again noted. Progressive infiltrate/edema in the left upper lung. Small bilateral pleural effusions can not be excluded. No pneumothorax. IMPRESSION: Cardiomegaly again noted. Diffuse severe bilateral pulmonary infiltrates/edema again noted. Progressive infiltrate/edema left upper lung. Small bilateral pleural effusions. Electronically Signed   By: Marcello Moores  Register   On: 10/08/2020 09:07   ASSESSMENT AND PLAN:   Steve Vance is a 60 y.o. male with medical history significant for end-stage renal disease on hemodialysis Tuesday Thursday Saturday, hypertension, recent Covid infection on 09/09/2020 status post treatment, obesity, presents emergency department for chief concerns of worsening shortness of breath x2-3 days. He states that shortness of breath has been ongoing for the last 2 to 3 weeks.  He endorses that he had a complete dialysis session on 10/04/2020.    Acute hypoxic respiratory secondary to volume overload/pulmonary edema  and anemia  Remains on heated high flow o2. Dialysis yesterday and day before, little over 6 L has been ultrafiltrated. Discussed w/ nephrology, thinks more fluid needs to be removed, plan on repeat dialysis w/  Ultrafiltration tomorrow. TEE w/ normal EF  08/2020. - continue o2, check vbg - will check lower extremity dopplers - sputum for culture - no fever or elevated white count but cxr with worsening consolidations, will reassess tomorrow after dialysis. If no improvement in respiratory status would consider empiric abx, CTA to eval for PE  End-stage renal disease on hemodialysis TTS schedule at home - dialyzed here 1/31, 2/1, plan for repeat 2/3  Anemia of chronic disease -  no signs of active bleeding at this time. baseline hemoglobin around 7.8. EGD last month showed no active bleed but inflammation throughout.  -- patient came in with hemoglobin of 6.3, h improved to 7.6 today - trend h/h - outpt colonoscopy  Type II diabetes with hyperlipidemia -sugars are stable. Will continue to monitor with sliding scale.  Hypertension -- continue beta-blockers and hydralazine  DVT prophylaxis: heparin Code Status: DNR Diet:  renal Family Communication: no Disposition Plan: pending clinical course Consults called: nephrology  Level of care: Progressive Cardiac Status is: Inpatient  Remains inpatient appropriate because: acute hypoxic respiratory failure secondary to pulmonary edema currently on heated high flow oxygen   Dispo: The patient is from: Home              Anticipated d/c is to: Home              Anticipated d/c date is: 2+ days              Patient currently is not medically stable to d/c.   Difficult to place patient No        TOTAL TIME TAKING CARE OF THIS PATIENT: 45 minutes.    Desma Maxim M.D    [facility]

## 2020-10-09 LAB — BLOOD GAS, VENOUS
Acid-Base Excess: 4.2 mmol/L — ABNORMAL HIGH (ref 0.0–2.0)
Bicarbonate: 29.7 mmol/L — ABNORMAL HIGH (ref 20.0–28.0)
O2 Saturation: 91.5 %
Patient temperature: 37
pCO2, Ven: 49 mmHg (ref 44.0–60.0)
pH, Ven: 7.39 (ref 7.250–7.430)
pO2, Ven: 63 mmHg — ABNORMAL HIGH (ref 32.0–45.0)

## 2020-10-09 LAB — BASIC METABOLIC PANEL
Anion gap: 13 (ref 5–15)
BUN: 42 mg/dL — ABNORMAL HIGH (ref 8–23)
CO2: 27 mmol/L (ref 22–32)
Calcium: 9.2 mg/dL (ref 8.9–10.3)
Chloride: 96 mmol/L — ABNORMAL LOW (ref 98–111)
Creatinine, Ser: 6.96 mg/dL — ABNORMAL HIGH (ref 0.61–1.24)
GFR, Estimated: 8 mL/min — ABNORMAL LOW (ref >60)
Glucose, Bld: 114 mg/dL — ABNORMAL HIGH (ref 70–99)
Potassium: 3.7 mmol/L (ref 3.5–5.1)
Sodium: 136 mmol/L (ref 135–145)

## 2020-10-09 LAB — CBC
HCT: 22.1 % — ABNORMAL LOW (ref 39.0–52.0)
Hemoglobin: 7.1 g/dL — ABNORMAL LOW (ref 13.0–17.0)
MCH: 28.4 pg (ref 26.0–34.0)
MCHC: 32.1 g/dL (ref 30.0–36.0)
MCV: 88.4 fL (ref 80.0–100.0)
Platelets: 226 10*3/uL (ref 150–400)
RBC: 2.5 MIL/uL — ABNORMAL LOW (ref 4.22–5.81)
RDW: 16.2 % — ABNORMAL HIGH (ref 11.5–15.5)
WBC: 7.1 10*3/uL (ref 4.0–10.5)
nRBC: 0 % (ref 0.0–0.2)

## 2020-10-09 MED ORDER — DEXTROMETHORPHAN POLISTIREX ER 30 MG/5ML PO SUER
30.0000 mg | Freq: Two times a day (BID) | ORAL | Status: DC | PRN
  Administered 2020-10-09 – 2020-10-10 (×2): 30 mg via ORAL
  Filled 2020-10-09 (×3): qty 5

## 2020-10-09 MED ORDER — DEXTROMETHORPHAN POLISTIREX ER 30 MG/5ML PO SUER
30.0000 mg | Freq: Every evening | ORAL | Status: DC | PRN
  Filled 2020-10-09: qty 5

## 2020-10-09 NOTE — Plan of Care (Signed)
Pt alert and oriented on 10L HFNC. Pt denied pain, nausea, SOB or headache.  0210: tele called that pt is on 85% SPO2. RN went to the room, pt yelling for help and out of breath. Nasal canula on the floor.  [place] applied and SPO2 $Remove'@100'GnIoddk$ %. Pt stated that it must have fel off while he was sleeping. No acute distress at this time Problem: Education: Goal: Knowledge of General Education information will improve Description: Including pain rating scale, medication(s)/side effects and non-pharmacologic comfort measures Outcome: Progressing   Problem: Coping: Goal: Level of anxiety will decrease Outcome: Progressing   Problem: Pain Managment: Goal: General experience of comfort will improve Outcome: Progressing   Problem: Safety: Goal: Ability to remain free from injury will improve Outcome: Progressing   Problem: Skin Integrity: Goal: Risk for impaired skin integrity will decrease Outcome: Progressing

## 2020-10-09 NOTE — Progress Notes (Signed)
[facility]  ROUNDING NOTE   Subjective:    presented to ER for SOB that worsened over last 2 days. Covid + on Jan 4 SOB on exertion Able to walk around room Patient underwent emergency hemodialysis 10/06/20.  2900 cc of fluid was removed then and 3500 cc removed 10/07/20.  He continues to require high flow oxygen today, 10 L.  He continues to have significant lower extremity edema Able to eat without nausea or vomiting   Objective:  Vital signs in last 24 hours:  Temp:  [97.8 F (36.6 C)-98.5 F (36.9 C)] 98.5 F (36.9 C) (02/03 0747) Pulse Rate:  [78-87] 81 (02/03 0747) Resp:  [17-20] 17 (02/03 0747) BP: (143-166)/(71-89) 166/84 (02/03 0747) SpO2:  [100 %] 100 % (02/03 0747) FiO2 (%):  [50 %] 50 % (02/02 1410) Weight:  [114.3 kg] 114.3 kg (02/03 0500)  Weight change: 0.311 kg Filed Weights   10/06/20 1024 10/08/20 0353 10/09/20 0500  Weight: 108.9 kg 114 kg 114.3 kg    Intake/Output: I/O last 3 completed shifts: In: 1680 [P.O.:1680] Out: 1400 [Urine:1400]   Intake/Output this shift:  Total I/O In: 240 [P.O.:240] Out: -   Physical Exam: General: NAD, sitting up on the edge of bed  Head: Normocephalic, atraumatic. Moist oral mucosal membranes  Lungs:   High flow nasal cannula oxygen 10 L, mild basilar crackles  Heart: Regular, tachycardic  Abdomen:  Soft, nontender  Extremities: 2+ peripheral edema.  Neurologic: Nonfocal, moving all four extremities  Skin: No lesions  Access: Left AVF    Basic Metabolic Panel: Recent Labs  Lab 10/06/20 1029 10/06/20 1211 10/07/20 0630 10/08/20 1105 10/09/20 0516  NA 137  --  136 135 136  K 3.8  --  3.9 3.8 3.7  CL 98  --  97* 97* 96*  CO2 26  --  $R'28 27 27  'dn$ GLUCOSE 94  --  109* 144* 114*  BUN 29*  --  24* 28* 42*  CREATININE 7.13*  --  5.69* 5.65* 6.96*  CALCIUM 9.3  --  9.0 9.2 9.2  PHOS  --  5.3*  --   --   --     Liver Function Tests: Recent Labs  Lab 10/06/20 1029 10/07/20 0630  AST 16 14*   ALT 9 10  ALKPHOS 55 53  BILITOT 0.8 0.7  PROT 8.0 7.7  ALBUMIN 3.3* 3.2*   No results for input(s): LIPASE, AMYLASE in the last 168 hours. No results for input(s): AMMONIA in the last 168 hours.  CBC: Recent Labs  Lab 10/06/20 1029 10/07/20 0630 10/08/20 1105 10/09/20 0516  WBC 6.9 8.4 8.2 7.1  NEUTROABS 4.6  --   --   --   HGB 6.3* 6.2* 7.6* 7.1*  HCT 20.5* 19.7* 23.6* 22.1*  MCV 89.5 88.7 88.7 88.4  PLT 225 241 237 226    Cardiac Enzymes: No results for input(s): CKTOTAL, CKMB, CKMBINDEX, TROPONINI in the last 168 hours.  BNP: Invalid input(s): POCBNP  CBG: Recent Labs  Lab 10/07/20 2028  GLUCAP 106*    Microbiology: Results for orders placed or performed during the hospital encounter of 10/06/20  MRSA PCR Screening     Status: None   Collection Time: 10/08/20  6:10 AM   Specimen: Nasal Mucosa; Nasopharyngeal  Result Value Ref Range Status   MRSA by PCR NEGATIVE NEGATIVE Final    Comment:        The GeneXpert MRSA Assay (FDA approved for NASAL specimens only), is  one component of a comprehensive MRSA colonization surveillance program. It is not intended to diagnose MRSA infection nor to guide or monitor treatment for MRSA infections. Performed at [facility], [address] [place], [place] [address]     Coagulation Studies: No results for input(s): LABPROT, INR in the last 72 hours.  Urinalysis: No results for input(s): COLORURINE, LABSPEC, PHURINE, GLUCOSEU, HGBUR, BILIRUBINUR, KETONESUR, PROTEINUR, UROBILINOGEN, NITRITE, LEUKOCYTESUR in the last 72 hours.  Invalid input(s): APPERANCEUR    Imaging: US Venous Img Lower Bilateral (DVT)  Result Date: 10/08/2020 CLINICAL DATA:  Leg swelling EXAM: BILATERAL LOWER EXTREMITY VENOUS DOPPLER ULTRASOUND TECHNIQUE: Gray-scale sonography with compression, as well as color and duplex ultrasound, were performed to evaluate the deep venous system(s) from the level of the common femoral vein  through the popliteal and proximal calf veins. COMPARISON:  None. FINDINGS: VENOUS Normal compressibility of the common femoral, superficial femoral, and popliteal veins, as well as the visualized calf veins. The [place] bilaterally is somewhat hyperdynamic. Visualized portions of profunda femoral vein and great saphenous vein unremarkable. No filling defects to suggest DVT on grayscale or color Doppler imaging. Doppler waveforms show normal direction of venous flow, normal respiratory plasticity and response to augmentation. OTHER There is nonspecific lower extremity edema bilaterally. Mildly enlarged but morphologically normal inguinal lymph nodes are noted. Limitations: none IMPRESSION: 1. No DVT. 2. Hyperdynamic waveform suggestive of right-sided heart failure. 3. There is nonspecific bilateral lower extremity edema. 4. Mildly enlarged but morphologically normal inguinal lymph nodes. Electronically Signed   By: Constance Holster M.D.   On: 10/08/2020 17:26   DG Chest Port 1 View  Result Date: 10/08/2020 CLINICAL DATA:  Shortness of breath.  COVID positive. EXAM: PORTABLE CHEST 1 VIEW COMPARISON:  10/06/2020. FINDINGS: Cardiomegaly again noted. Diffuse severe bilateral pulmonary infiltrates/edema again noted. Progressive infiltrate/edema in the left upper lung. Small bilateral pleural effusions can not be excluded. No pneumothorax. IMPRESSION: Cardiomegaly again noted. Diffuse severe bilateral pulmonary infiltrates/edema again noted. Progressive infiltrate/edema left upper lung. Small bilateral pleural effusions. Electronically Signed   By: Marcello Moores  Register   On: 10/08/2020 09:07     Medications:    . sodium chloride   Intravenous Once  . sodium chloride   Intravenous Once  . amLODipine  10 mg Oral Daily  . Chlorhexidine Gluconate Cloth  6 each Topical Q0600  . famotidine  10 mg Oral Daily  . heparin  5,000 Units Subcutaneous Q8H  . hydrALAZINE  25 mg Oral Q8H  . lisinopril  20 mg Oral Daily   . metoprolol tartrate  50 mg Oral BID   acetaminophen **OR** acetaminophen, albuterol, benzonatate, diclofenac Sodium, ondansetron **OR** ondansetron (ZOFRAN) IV  Assessment/ Plan:  [name] is a 60 y.o. black male with end stage renal disease on hemodialysis, hypertension, sleep apnea, diabetes mellitus type II, chronic back pain, right toe amputation who is admitted to [facility] on 10/06/2020 for Acute pulmonary edema ([place]) [J81.0] SOB (shortness of breath) [R06.02] Acute respiratory failure with hypoxia ([place]) [J96.01] Acute hypoxemic respiratory failure ([place]) [J96.01]  [facility] TTS [place] Left AVF 115.5kg  #.  End-stage renal disease, shortness of breath due to acute pulmonary edema  October 06 2898 cc removed October 07 3498 cc removed Hemodialysis scheduled today.~ UF 3 L  #. Anemia with chronic kidney disease:   Lab Results  Component Value Date   HGB 7.1 (L) 10/09/2020  Blood transfusion given during dialysis.   -Epogen with hemodialysis  #Recent history of  GI bleed EGD done on January 20 showed diffuse moderately erythematous mucosa without active bleeding.  Colonoscopy recommended as outpatient  #. Secondary Hyperparathyroidism:  Previously was on calcium acetate 2 tablets p.o. 3 times daily with meals. Monitor phos   LOS: 2 Steve Vance 2/3/202210:23 AM

## 2020-10-09 NOTE — Progress Notes (Signed)
[facility] [place] NAME: Steve Vance    MR#:  [id number]  DATE OF BIRTH:  1961-07-20  SUBJECTIVE:  Reclining in bed receiving dialysis. Says feels well. Thinks breathing and swelling somewhat improved. Tolerating diet, no chest pain. Occasional dry cough.   Tolerating Diet:yes Tolerating PT:   DRUG ALLERGIES:   Allergies  Allergen Reactions  . Baclofen Nausea And Vomiting  . Gabapentin Nausea And Vomiting    VITALS:  Blood pressure (!) 148/74, pulse 89, temperature 98.2 F (36.8 C), temperature source Oral, resp. rate 16, height $RemoveBe'5\' 11"'aSHImRYdL$  (1.803 m), weight 114.3 kg, SpO2 100 %.  PHYSICAL EXAMINATION:   Physical Exam  GENERAL:  60 y.o.-year-old patient reclining in the bed with no acute distress. Obesity HEENT: Head atraumatic, normocephalic. Oropharynx and nasopharynx clear.  LUNGS: diminished breath sounds bilaterally, faint exp wheeze CARDIOVASCULAR: S1, S2 normal. Distant heart sounds ABDOMEN: Soft, nontender, obese.  EXTREMITIES: No cyanosis, clubbing. 2+ LE edema improved from yesterday NEUROLOGIC: moving all 4 extremities equally PSYCHIATRIC:  patient is alert and oriented x 3.  SKIN: No obvious rash, lesion, or ulcer.   LABORATORY PANEL:  CBC Recent Labs  Lab 10/09/20 0516  WBC 7.1  HGB 7.1*  HCT 22.1*  PLT 226    Chemistries  Recent Labs  Lab 10/07/20 0630 10/08/20 1105 10/09/20 0516  NA 136   < > 136  K 3.9   < > 3.7  CL 97*   < > 96*  CO2 28   < > 27  GLUCOSE 109*   < > 114*  BUN 24*   < > 42*  CREATININE 5.69*   < > 6.96*  CALCIUM 9.0   < > 9.2  AST 14*  --   --   ALT 10  --   --   ALKPHOS 53  --   --   BILITOT 0.7  --   --    < > = values in this interval not displayed.   Cardiac Enzymes No results for input(s): TROPONINI in the last 168 hours. RADIOLOGY:  US Venous Img Lower Bilateral (DVT)  Result Date: 10/08/2020 CLINICAL DATA:  Leg swelling EXAM: BILATERAL LOWER EXTREMITY VENOUS  DOPPLER ULTRASOUND TECHNIQUE: Gray-scale sonography with compression, as well as color and duplex ultrasound, were performed to evaluate the deep venous system(s) from the level of the common femoral vein through the popliteal and proximal calf veins. COMPARISON:  None. FINDINGS: VENOUS Normal compressibility of the common femoral, superficial femoral, and popliteal veins, as well as the visualized calf veins. The [place] bilaterally is somewhat hyperdynamic. Visualized portions of profunda femoral vein and great saphenous vein unremarkable. No filling defects to suggest DVT on grayscale or color Doppler imaging. Doppler waveforms show normal direction of venous flow, normal respiratory plasticity and response to augmentation. OTHER There is nonspecific lower extremity edema bilaterally. Mildly enlarged but morphologically normal inguinal lymph nodes are noted. Limitations: none IMPRESSION: 1. No DVT. 2. Hyperdynamic waveform suggestive of right-sided heart failure. 3. There is nonspecific bilateral lower extremity edema. 4. Mildly enlarged but morphologically normal inguinal lymph nodes. Electronically Signed   By: Constance Holster M.D.   On: 10/08/2020 17:26   DG Chest Port 1 View  Result Date: 10/08/2020 CLINICAL DATA:  Shortness of breath.  COVID positive. EXAM: PORTABLE CHEST 1 VIEW COMPARISON:  10/06/2020. FINDINGS: Cardiomegaly again noted. Diffuse severe bilateral pulmonary infiltrates/edema again noted. Progressive infiltrate/edema in the left upper lung. Small bilateral  pleural effusions can not be excluded. No pneumothorax. IMPRESSION: Cardiomegaly again noted. Diffuse severe bilateral pulmonary infiltrates/edema again noted. Progressive infiltrate/edema left upper lung. Small bilateral pleural effusions. Electronically Signed   By: Marcello Moores  Register   On: 10/08/2020 09:07   ASSESSMENT AND PLAN:   Steve Vance is a 60 y.o. male with medical history significant for end-stage renal disease on  hemodialysis Tuesday Thursday Saturday, hypertension, recent Covid infection on 09/09/2020 status post treatment, obesity, presents emergency department for chief concerns of worsening shortness of breath x2-3 days. He states that shortness of breath has been ongoing for the last 2 to 3 weeks.  He endorses that he had a complete dialysis session on 10/04/2020.    Acute hypoxic respiratory secondary to volume overload/pulmonary edema  and anemia Remains on 10 L O2. Dialyzed 1/31, 2/1. Currently undergoing dialysis, plan to remove 3.5 L today. Suspect will be able to wean down o2 after dialysis session today. TEE w/ normal EF 08/2020. No fever or white count to suggest pneumonia. LE dopplers neg for DVT. VBG with mild hypercarbia improving - continue o2 - further w/u of hypoxia if o2 requirement does not improve after today's dialysis  End-stage renal disease on hemodialysis TTS schedule at home - dialyzed here 1/31, 2/1, today  Anemia of chronic disease  Recent UGIB  no signs of active bleeding at this time. baseline hemoglobin around 7.8. EGD last month showed no active bleed but inflammation throughout, that was in setting of hospitalization for melanotic stool. Has received 2 units w/ dialysis. -- patient came in with hemoglobin of 6.3, h improved to 7s now - trend h/h - outpt colonoscopy  Type II diabetes with hyperlipidemia -sugars are stable and mild. Will continue to monitor and treat with sliding scale.  Hypertension -- continue beta-blockers and hydralazine  DVT prophylaxis: heparin Code Status: full code Diet:  renal Family Communication: no Disposition Plan: pending clinical course Consults called: nephrology  Level of care: Progressive Cardiac Status is: Inpatient  Remains inpatient appropriate because: acute hypoxic respiratory failure secondary to pulmonary edema currently on o2   Dispo: The patient is from: Home              Anticipated d/c is to: Home               Anticipated d/c date is: 2+ days              Patient currently is not medically stable to d/c.   Difficult to place patient No        TOTAL TIME TAKING CARE OF THIS PATIENT: 35 minutes.    Desma Maxim M.D    [facility]

## 2020-10-10 LAB — FIBRIN DERIVATIVES D-DIMER (ARMC ONLY): Fibrin derivatives D-dimer (ARMC): 1797.63 ng/mL (FEU) — ABNORMAL HIGH (ref 0.00–499.00)

## 2020-10-10 LAB — BASIC METABOLIC PANEL
Anion gap: 10 (ref 5–15)
BUN: 38 mg/dL — ABNORMAL HIGH (ref 8–23)
CO2: 26 mmol/L (ref 22–32)
Calcium: 9.4 mg/dL (ref 8.9–10.3)
Chloride: 97 mmol/L — ABNORMAL LOW (ref 98–111)
Creatinine, Ser: 5.57 mg/dL — ABNORMAL HIGH (ref 0.61–1.24)
GFR, Estimated: 11 mL/min — ABNORMAL LOW (ref >60)
Glucose, Bld: 131 mg/dL — ABNORMAL HIGH (ref 70–99)
Potassium: 3.8 mmol/L (ref 3.5–5.1)
Sodium: 133 mmol/L — ABNORMAL LOW (ref 135–145)

## 2020-10-10 LAB — CBC
HCT: 23.7 % — ABNORMAL LOW (ref 39.0–52.0)
Hemoglobin: 7.5 g/dL — ABNORMAL LOW (ref 13.0–17.0)
MCH: 28.1 pg (ref 26.0–34.0)
MCHC: 31.6 g/dL (ref 30.0–36.0)
MCV: 88.8 fL (ref 80.0–100.0)
Platelets: 288 10*3/uL (ref 150–400)
RBC: 2.67 MIL/uL — ABNORMAL LOW (ref 4.22–5.81)
RDW: 15.9 % — ABNORMAL HIGH (ref 11.5–15.5)
WBC: 6.2 10*3/uL (ref 4.0–10.5)
nRBC: 0 % (ref 0.0–0.2)

## 2020-10-10 LAB — PROCALCITONIN: Procalcitonin: 4.24 ng/mL

## 2020-10-10 NOTE — Progress Notes (Signed)
Patient ambulated around the unit without O2.  Per Student nurse- O2 dropped down for a short moment to 88-89%, but jumped right back up into the 90s on room air.    MD is aware. [place] for discharge

## 2020-10-10 NOTE — Progress Notes (Signed)
SATURATION QUALIFICATIONS: (This note is used to comply with regulatory documentation for home oxygen)  Patient Saturations on Room Air at Rest = 99%  Patient Saturations on Room Air while Ambulating = 90%

## 2020-10-10 NOTE — Discharge Summary (Signed)
[name] SFK:[id number] DOB: 1961-02-13 DOA: 10/06/2020  PCP: Theotis Burrow, MD  Admit date: 10/06/2020 Discharge date: 10/10/2020  Time spent: 35 minutes  Recommendations for Outpatient Follow-up:  1. PCP f/u 1-2 weeks 2. Focus paid to fluid restriction  3. Needs outpatient colonoscopy (referral made)    Discharge Diagnoses:  Principal Problem:   Acute hypoxemic respiratory failure ([place]) Active Problems:   Essential hypertension   ESRD on dialysis [facility])   DDD (degenerative disc disease), lumbosacral   Elevated troponin   Fluid overload   Discharge Condition: fair  Diet recommendation: low sodium  Filed Weights   10/08/20 0353 10/09/20 0500 10/10/20 0505  Weight: 114 kg 114.3 kg 112.9 kg    History of present illness:  Steve Vance is a 60 y.o. male with medical history significant for end-stage renal disease on hemodialysis Tuesday Thursday Saturday, hypertension, recent Covid infection on 09/09/2020 status post treatment, obesity, presents emergency department for chief concerns of worsening shortness of breath x2-3 days.  He states that shortness of breath has been ongoing for the last 2 to 3 weeks.  He endorses that he had a complete dialysis session on 10/04/2020.  He also states that he has completed his vancomycin treatment.  At bedside he is awake and alert and oriented using accessory muscles and increased respiration effort and rate.  He was able to tell me his name his age location of hospital and the year is 110.  With his increased respiratory use and fluid congestion on chest x-ray I recommended that patient be placed on BiPAP in order to decrease the volume overload in his lungs temporarily.  I stated that the BiPAP was not cannot be used continuously but likely just 1 to 2 hours would provide him with some benefit.  He declined multiple times and at times asked me what was wrong with me because I I would not stop recommendations of BiPAP or CPAP.   He states that he does not want BiPAP, CPAP, intubation or chest compressions.  He just wants to be placed on nasal cannula at max.  He states he had a bowel this AM prior to presentation and it is normal and brown. He denies nausea, vomiting, fever, chest pain, abdominal pain, dysuria, hematuria.  He states he still makes urine about 3 times a day.  He reports that the shortness of breath is worse with exertion.  Hospital Course:   Acute hypoxic respiratory secondary to volume overload/pulmonary edema and anemia Initially requiring 10 L, now weaned off with ultrafiltration. Has not been compliant w/ outpatient fluid restriction.  - continue tts outpt dialysis - I and nephrology both discussed importance of fluid restriction, low sodium diet.   End-stage renal disease on hemodialysis TTS schedule at home - dialyzed here 1/31, 2/1, 2/3  Anemia of chronic disease  Recent UGIB  no signs of active bleeding at this time. baseline hemoglobin around 7.8. EGD last month showed no active bleed but inflammation throughout, that was in setting of hospitalization for melanotic stool. Has received 2 units w/ dialysis. H stable in the 7s - outpt colonoscopy, referral made to GI  Type II diabetes with hyperlipidemia -sugars here are stable and mild  Hypertension -- continued beta-blockers and hydralazine  Procedures:  dialysis   Consultations:  nephrology  Discharge Exam: Vitals:   10/10/20 0505 10/10/20 0803  BP: (!) 161/83 (!) 162/81  Pulse: 86 86  Resp: 18 18  Temp: 98.1 F (36.7 C) 98.3 F (36.8 C)  SpO2: 100% 97%    GENERAL:  60 y.o.-year-old patient reclining in the bed with no acute distress. Obesity HEENT: Head atraumatic, normocephalic. Oropharynx and nasopharynx clear.  LUNGS: diminished breath sounds bilaterally, faint exp wheeze. Improved from prior CARDIOVASCULAR: S1, S2 normal. Distant heart sounds ABDOMEN: Soft, nontender, obese.  EXTREMITIES: No cyanosis,  clubbing. 2+ LE edema improving NEUROLOGIC: moving all 4 extremities equally PSYCHIATRIC:  patient is alert and oriented x 3.  SKIN: No obvious rash, lesion, or ulcer.   Discharge Instructions   Discharge Instructions    Ambulatory referral to Gastroenterology   Complete by: As directed    Diet - low sodium heart healthy   Complete by: As directed    Increase activity slowly   Complete by: As directed      Allergies as of 10/10/2020      Reactions   Baclofen Nausea And Vomiting   Gabapentin Nausea And Vomiting      Medication List    TAKE these medications   Accu-Chek FastClix Lancets Misc USE TO CHECK BLOOD SUGAR UP TO 4 TIMES DAILY AS DIRECTED   albuterol 108 (90 Base) MCG/ACT inhaler Commonly known as: VENTOLIN HFA Inhale 1-2 puffs into the lungs every 4 (four) hours as needed for wheezing or shortness of breath.   amLODipine 10 MG tablet Commonly known as: NORVASC Take 1 tablet (10 mg total) by mouth daily.   aspirin 81 MG EC tablet Take 1 tablet (81 mg total) by mouth daily.   benzonatate 100 MG capsule Commonly known as: TESSALON Take 1 capsule (100 mg total) by mouth 3 (three) times daily as needed for cough.   blood glucose meter kit and supplies Kit Dispense based on patient and insurance preference. Use up to four times daily as directed. (FOR ICD-9 250.00, 250.01).   famotidine 20 MG tablet Commonly known as: PEPCID Take 1 tablet (20 mg total) by mouth daily.   hydrALAZINE 25 MG tablet Commonly known as: APRESOLINE Take 1 tablet (25 mg total) by mouth every 8 (eight) hours.   HYDROcodone-acetaminophen 5-325 MG tablet Commonly known as: Norco Take 1 tablet by mouth every 6 (six) hours as needed for severe pain.   lisinopril 20 MG tablet Commonly known as: ZESTRIL Take 1 tablet (20 mg total) by mouth daily.   metoprolol tartrate 25 MG tablet Commonly known as: LOPRESSOR Take 2 tablets (50 mg total) by mouth 2 (two) times daily.       Allergies  Allergen Reactions  . Baclofen Nausea And Vomiting  . Gabapentin Nausea And Vomiting      The results of significant diagnostics from this hospitalization (including imaging, microbiology, ancillary and laboratory) are listed below for reference.    Significant Diagnostic Studies: CT ABDOMEN PELVIS WO CONTRAST  Result Date: 09/24/2020 CLINICAL DATA:  Abdominal pain. EXAM: CT ABDOMEN AND PELVIS WITHOUT CONTRAST TECHNIQUE: Multidetector CT imaging of the abdomen and pelvis was performed following the standard protocol without IV contrast. COMPARISON:  September 10, 1999 FINDINGS: Lower chest: There are small bilateral pleural effusions.There is cardiomegaly. The intracardiac blood pool is hypodense relative to the adjacent myocardium consistent with anemia. There is scattered hazy airspace opacities at the lung bases bilaterally. Hepatobiliary: The liver is normal. There are stones versus gallbladder sludge.There is no biliary ductal dilation. Pancreas: Normal contours without ductal dilatation. No peripancreatic fluid collection. Spleen: Unremarkable. Adrenals/Urinary Tract: --Adrenal glands: Unremarkable. --Right kidney/ureter: No hydronephrosis or radiopaque kidney stones. --Left kidney/ureter: There is haziness and fat stranding about the  left kidney which appears to be new since prior study. There is no hydronephrosis. There is an ill-defined masslike exophytic area arising from the left kidney measuring approximately 3 cm and 20 Hounsfield units. This likely represents a cyst seen on the patient's recent ultrasound. --Urinary bladder: Unremarkable. Stomach/Bowel: --Stomach/Duodenum: No hiatal hernia or other gastric abnormality. Normal duodenal course and caliber. --Small bowel: Unremarkable. --Colon: Unremarkable. --Appendix: Normal. Vascular/Lymphatic: Atherosclerotic calcification is present within the non-aneurysmal abdominal aorta, without hemodynamically significant stenosis.  --there is mild retroperitoneal adenopathy. --No mesenteric lymphadenopathy. --there is pelvic adenopathy with symmetric pelvic sidewall lymph nodes measuring up to approximately 1.5 cm. Reproductive: Unremarkable Other: There is a trace amount of free fluid in the abdomen. No free air. There are bilateral fat containing inguinal hernias. Musculoskeletal. No acute displaced fractures. IMPRESSION: 1. Diffuse hazy bilateral airspace opacities are noted at the lung bases with new bilateral pleural effusions and interlobular septal thickening. Findings may be secondary to developing pulmonary edema versus an atypical infectious process. 2. New fat stranding about the left kidney is nonspecific and may be secondary to pyelonephritis. Correlation with laboratory studies is recommended. 3. Mild retroperitoneal and pelvic adenopathy of unknown clinical significance. 4. Cardiomegaly. 5. Cholelithiasis without secondary signs of acute cholecystitis. 6. Trace amount of free fluid in the abdomen. Aortic Atherosclerosis (ICD10-I70.0). Electronically Signed   By: Constance Holster M.D.   On: 09/24/2020 05:45   DG Chest 2 View  Result Date: 09/23/2020 CLINICAL DATA:  Shortness of breath.  COVID 19 pneumonia. EXAM: CHEST - 2 VIEW COMPARISON:  09/09/2020 FINDINGS: Midline trachea. Cardiomegaly, accentuated by low lung volumes on the frontal AP radiograph. Pulmonary interstitial prominence. Right greater than left base airspace disease. No pleural effusion or pneumothorax. Left glenoid fixation. IMPRESSION: Cardiomegaly and mild pulmonary interstitial prominence, suspicious for venous congestion. Bibasilar airspace disease is similar. Atelectasis versus multifocal pneumonia. Electronically Signed   By: Abigail Miyamoto M.D.   On: 09/23/2020 09:56   US Venous Img Lower Bilateral (DVT)  Result Date: 10/08/2020 CLINICAL DATA:  Leg swelling EXAM: BILATERAL LOWER EXTREMITY VENOUS DOPPLER ULTRASOUND TECHNIQUE: Gray-scale sonography  with compression, as well as color and duplex ultrasound, were performed to evaluate the deep venous system(s) from the level of the common femoral vein through the popliteal and proximal calf veins. COMPARISON:  None. FINDINGS: VENOUS Normal compressibility of the common femoral, superficial femoral, and popliteal veins, as well as the visualized calf veins. The [place] bilaterally is somewhat hyperdynamic. Visualized portions of profunda femoral vein and great saphenous vein unremarkable. No filling defects to suggest DVT on grayscale or color Doppler imaging. Doppler waveforms show normal direction of venous flow, normal respiratory plasticity and response to augmentation. OTHER There is nonspecific lower extremity edema bilaterally. Mildly enlarged but morphologically normal inguinal lymph nodes are noted. Limitations: none IMPRESSION: 1. No DVT. 2. Hyperdynamic waveform suggestive of right-sided heart failure. 3. There is nonspecific bilateral lower extremity edema. 4. Mildly enlarged but morphologically normal inguinal lymph nodes. Electronically Signed   By: Constance Holster M.D.   On: 10/08/2020 17:26   DG Chest Port 1 View  Result Date: 10/08/2020 CLINICAL DATA:  Shortness of breath.  COVID positive. EXAM: PORTABLE CHEST 1 VIEW COMPARISON:  10/06/2020. FINDINGS: Cardiomegaly again noted. Diffuse severe bilateral pulmonary infiltrates/edema again noted. Progressive infiltrate/edema in the left upper lung. Small bilateral pleural effusions can not be excluded. No pneumothorax. IMPRESSION: Cardiomegaly again noted. Diffuse severe bilateral pulmonary infiltrates/edema again noted. Progressive infiltrate/edema left upper lung. Small bilateral pleural  effusions. Electronically Signed   By: Marcello Moores  Register   On: 10/08/2020 09:07   DG Chest Port 1 View  Result Date: 10/06/2020 CLINICAL DATA:  Short of breath.  COVID 19. EXAM: PORTABLE CHEST 1 VIEW COMPARISON:  09/23/2020 FINDINGS: Midline trachea. Mild  cardiomegaly, accentuated by AP portable technique. Possible layering bilateral pleural effusions. No pneumothorax. Worsened aeration, with right greater than left and lower lung predominant interstitial and airspace disease bilaterally. IMPRESSION: Lower lung predominant interstitial and airspace disease is progressive compared to 09/23/2020. Given the clinical history, at least partially secondary to COVID 19 pneumonia. A component of concurrent congestive heart failure is suspected. Electronically Signed   By: Abigail Miyamoto M.D.   On: 10/06/2020 11:22    Microbiology: Recent Results (from the past 240 hour(s))  MRSA PCR Screening     Status: None   Collection Time: 10/08/20  6:10 AM   Specimen: Nasal Mucosa; Nasopharyngeal  Result Value Ref Range Status   MRSA by PCR NEGATIVE NEGATIVE Final    Comment:        The GeneXpert MRSA Assay (FDA approved for NASAL specimens only), is one component of a comprehensive MRSA colonization surveillance program. It is not intended to diagnose MRSA infection nor to guide or monitor treatment for MRSA infections. Performed at [facility], [address] [place], [place] [address]      Labs: Basic Metabolic Panel: Recent Labs  Lab 10/06/20 1029 10/06/20 1211 10/07/20 0630 10/08/20 1105 10/09/20 0516 10/10/20 0513  NA 137  --  136 135 136 133*  K 3.8  --  3.9 3.8 3.7 3.8  CL 98  --  97* 97* 96* 97*  CO2 26  --  '28 27 27 26  ' GLUCOSE 94  --  109* 144* 114* 131*  BUN 29*  --  24* 28* 42* 38*  CREATININE 7.13*  --  5.69* 5.65* 6.96* 5.57*  CALCIUM 9.3  --  9.0 9.2 9.2 9.4  PHOS  --  5.3*  --   --   --   --    Liver Function Tests: Recent Labs  Lab 10/06/20 1029 10/07/20 0630  AST 16 14*  ALT 9 10  ALKPHOS 55 53  BILITOT 0.8 0.7  PROT 8.0 7.7  ALBUMIN 3.3* 3.2*   No results for input(s): LIPASE, AMYLASE in the last 168 hours. No results for input(s): AMMONIA in the last 168 hours. CBC: Recent Labs  Lab  10/06/20 1029 10/07/20 0630 10/08/20 1105 10/09/20 0516 10/10/20 0513  WBC 6.9 8.4 8.2 7.1 6.2  NEUTROABS 4.6  --   --   --   --   HGB 6.3* 6.2* 7.6* 7.1* 7.5*  HCT 20.5* 19.7* 23.6* 22.1* 23.7*  MCV 89.5 88.7 88.7 88.4 88.8  PLT 225 241 237 226 288   Cardiac Enzymes: No results for input(s): CKTOTAL, CKMB, CKMBINDEX, TROPONINI in the last 168 hours. BNP: BNP (last 3 results) Recent Labs    09/09/20 1516 09/23/20 1010  BNP 690.8* 638.0*    ProBNP (last 3 results) No results for input(s): PROBNP in the last 8760 hours.  CBG: Recent Labs  Lab 10/07/20 2028  GLUCAP 106*       Signed:  Desma Maxim MD.  [facility] 10/10/2020, 10:45 AM

## 2020-10-10 NOTE — Care Management Important Message (Signed)
Important Message  Patient Details  Name: Steve Vance MRN: [id number] Date of Birth: [birth year]   Medicare Important Message Given:  Yes     Dannette Barbara 10/10/2020, 12:30 PM

## 2020-10-10 NOTE — Progress Notes (Signed)
[facility]  ROUNDING NOTE   Subjective:    presented to ER for SOB that worsened over last 2 days. Covid + on Jan 4 SOB on exertion Able to walk around room Patient underwent emergency hemodialysis 10/06/20.  2900 cc of fluid was removed then and 3500 cc removed 10/07/20.    Another 3 L of fluid was removed on February 3 Today patient is on room air, sitting in the chair ready for discharge   Objective:  Vital signs in last 24 hours:  Temp:  [98.1 F (36.7 C)-98.3 F (36.8 C)] 98.3 F (36.8 C) (02/04 0803) Pulse Rate:  [83-88] 86 (02/04 0803) Resp:  [18-20] 18 (02/04 0803) BP: (160-166)/(77-84) 162/81 (02/04 0803) SpO2:  [97 %-100 %] 97 % (02/04 0803) Weight:  [112.9 kg] 112.9 kg (02/04 0505)  Weight change: -1.354 kg Filed Weights   10/08/20 0353 10/09/20 0500 10/10/20 0505  Weight: 114 kg 114.3 kg 112.9 kg    Intake/Output: I/O last 3 completed shifts: In: 480 [P.O.:480] Out: 3795 [Urine:800; Other:2995]   Intake/Output this shift:  Total I/O In: 240 [P.O.:240] Out: -   Physical Exam: General: NAD, sitting up on the edge of bed  Head: Normocephalic, atraumatic. Moist oral mucosal membranes  Lungs:   Room air, clear to auscultation  Heart: Regular, tachycardic  Abdomen:  Soft, nontender  Extremities: 1-2+ peripheral edema.  Neurologic: Nonfocal, moving all four extremities  Skin: No lesions  Access: Left AVF    Basic Metabolic Panel: Recent Labs  Lab 10/06/20 1029 10/06/20 1211 10/07/20 0630 10/08/20 1105 10/09/20 0516 10/10/20 0513  NA 137  --  136 135 136 133*  K 3.8  --  3.9 3.8 3.7 3.8  CL 98  --  97* 97* 96* 97*  CO2 26  --  $R'28 27 27 26  'Vd$ GLUCOSE 94  --  109* 144* 114* 131*  BUN 29*  --  24* 28* 42* 38*  CREATININE 7.13*  --  5.69* 5.65* 6.96* 5.57*  CALCIUM 9.3  --  9.0 9.2 9.2 9.4  PHOS  --  5.3*  --   --   --   --     Liver Function Tests: Recent Labs  Lab 10/06/20 1029 10/07/20 0630  AST 16 14*  ALT 9 10  ALKPHOS 55  53  BILITOT 0.8 0.7  PROT 8.0 7.7  ALBUMIN 3.3* 3.2*   No results for input(s): LIPASE, AMYLASE in the last 168 hours. No results for input(s): AMMONIA in the last 168 hours.  CBC: Recent Labs  Lab 10/06/20 1029 10/07/20 0630 10/08/20 1105 10/09/20 0516 10/10/20 0513  WBC 6.9 8.4 8.2 7.1 6.2  NEUTROABS 4.6  --   --   --   --   HGB 6.3* 6.2* 7.6* 7.1* 7.5*  HCT 20.5* 19.7* 23.6* 22.1* 23.7*  MCV 89.5 88.7 88.7 88.4 88.8  PLT 225 241 237 226 288    Cardiac Enzymes: No results for input(s): CKTOTAL, CKMB, CKMBINDEX, TROPONINI in the last 168 hours.  BNP: Invalid input(s): POCBNP  CBG: Recent Labs  Lab 10/07/20 2028  GLUCAP 106*    Microbiology: Results for orders placed or performed during the hospital encounter of 10/06/20  MRSA PCR Screening     Status: None   Collection Time: 10/08/20  6:10 AM   Specimen: Nasal Mucosa; Nasopharyngeal  Result Value Ref Range Status   MRSA by PCR NEGATIVE NEGATIVE Final    Comment:        The  GeneXpert MRSA Assay (FDA approved for NASAL specimens only), is one component of a comprehensive MRSA colonization surveillance program. It is not intended to diagnose MRSA infection nor to guide or monitor treatment for MRSA infections. Performed at [facility], [address] [place], [place] [address]     Coagulation Studies: No results for input(s): LABPROT, INR in the last 72 hours.  Urinalysis: No results for input(s): COLORURINE, LABSPEC, PHURINE, GLUCOSEU, HGBUR, BILIRUBINUR, KETONESUR, PROTEINUR, UROBILINOGEN, NITRITE, LEUKOCYTESUR in the last 72 hours.  Invalid input(s): APPERANCEUR    Imaging: US Venous Img Lower Bilateral (DVT)  Result Date: 10/08/2020 CLINICAL DATA:  Leg swelling EXAM: BILATERAL LOWER EXTREMITY VENOUS DOPPLER ULTRASOUND TECHNIQUE: Gray-scale sonography with compression, as well as color and duplex ultrasound, were performed to evaluate the deep venous system(s) from the level of the  common femoral vein through the popliteal and proximal calf veins. COMPARISON:  None. FINDINGS: VENOUS Normal compressibility of the common femoral, superficial femoral, and popliteal veins, as well as the visualized calf veins. The [place] bilaterally is somewhat hyperdynamic. Visualized portions of profunda femoral vein and great saphenous vein unremarkable. No filling defects to suggest DVT on grayscale or color Doppler imaging. Doppler waveforms show normal direction of venous flow, normal respiratory plasticity and response to augmentation. OTHER There is nonspecific lower extremity edema bilaterally. Mildly enlarged but morphologically normal inguinal lymph nodes are noted. Limitations: none IMPRESSION: 1. No DVT. 2. Hyperdynamic waveform suggestive of right-sided heart failure. 3. There is nonspecific bilateral lower extremity edema. 4. Mildly enlarged but morphologically normal inguinal lymph nodes. Electronically Signed   By: Constance Holster M.D.   On: 10/08/2020 17:26     Medications:    . sodium chloride   Intravenous Once  . sodium chloride   Intravenous Once  . amLODipine  10 mg Oral Daily  . Chlorhexidine Gluconate Cloth  6 each Topical Q0600  . famotidine  10 mg Oral Daily  . heparin  5,000 Units Subcutaneous Q8H  . hydrALAZINE  25 mg Oral Q8H  . lisinopril  20 mg Oral Daily  . metoprolol tartrate  50 mg Oral BID   albuterol, benzonatate, dextromethorphan, diclofenac Sodium, ondansetron **OR** ondansetron (ZOFRAN) IV  Assessment/ Plan:  [name] is a 60 y.o. black male with end stage renal disease on hemodialysis, hypertension, sleep apnea, diabetes mellitus type II, chronic back pain, right toe amputation who is admitted to [facility] on 10/06/2020 for Acute pulmonary edema ([place]) [J81.0] SOB (shortness of breath) [R06.02] Acute respiratory failure with hypoxia ([place]) [J96.01] Acute hypoxemic respiratory failure ([place]) [J96.01]  [facility] TTS [place] Left AVF  115.5kg  #.  End-stage renal disease, shortness of breath due to acute pulmonary edema  October 06 2898 cc removed October 07 3498 cc removed October 09 2998 cc removed Patient is now on room air Acute pulmonary edema has resolved Okay to discharge from renal standpoint  #. Anemia with chronic kidney disease:   Lab Results  Component Value Date   HGB 7.5 (L) 10/10/2020  Blood transfusion given during dialysis.   -Epogen with hemodialysis  #Recent history of GI bleed EGD done on January 20 showed diffuse moderately erythematous mucosa without active bleeding.  Colonoscopy recommended as outpatient  #. Secondary Hyperparathyroidism:  Previously was on calcium acetate 2 tablets p.o. 3 times daily with meals. Monitor phos   LOS: 3 Lilo [place] 2/4/20222:22 PM

## 2020-10-20 ENCOUNTER — Encounter: Payer: Self-pay | Admitting: Cardiology

## 2020-10-20 ENCOUNTER — Ambulatory Visit (INDEPENDENT_AMBULATORY_CARE_PROVIDER_SITE_OTHER): Payer: Medicare HMO | Admitting: Cardiology

## 2020-10-20 ENCOUNTER — Other Ambulatory Visit: Payer: Self-pay

## 2020-10-20 VITALS — BP 170/70 | HR 94 | Ht 71.0 in | Wt 245.0 lb

## 2020-10-20 DIAGNOSIS — R0602 Shortness of breath: Secondary | ICD-10-CM

## 2020-10-20 DIAGNOSIS — I10 Essential (primary) hypertension: Secondary | ICD-10-CM

## 2020-10-20 MED ORDER — LISINOPRIL 40 MG PO TABS: 40.0000 mg | ORAL_TABLET | Freq: Every day | ORAL | 0 refills | Status: DC
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
## 2020-10-20 NOTE — Patient Instructions (Signed)
Medication Instructions:   Your physician has recommended you make the following change in your medication:   1. INCREASE Lisinopril 40MG   - Take One tablet (40MG )  by mouth daily.   *If you need a refill on your cardiac medications before your next appointment, please call your pharmacy*   Lab Work:  1. None ordered  If you have labs (blood work) drawn today and your tests are completely normal, you will receive your results only by: Marland Kitchen MyChart Message (if you have MyChart) OR . A paper copy in the mail If you have any lab test that is abnormal or we need to change your treatment, we will call you to review the results.   Testing/Procedures:  1. None Ordered   Follow-Up: At [facility], you and your health needs are our priority.  As part of our continuing mission to provide you with exceptional heart care, we have created designated Provider Care Teams.  These Care Teams include your primary Cardiologist (physician) and Advanced Practice Providers (APPs -  Physician Assistants and Nurse Practitioners) who all work together to provide you with the care you need, when you need it.  We recommend signing up for the patient portal called "MyChart".  Sign up information is provided on this After Visit Summary.  MyChart is used to connect with patients for Virtual Visits (Telemedicine).  Patients are able to view lab/test results, encounter notes, upcoming appointments, etc.  Non-urgent messages can be sent to your provider as well.   To learn more about what you can do with MyChart, go to [email or web address].    Your next appointment:   6 month(s)  The format for your next appointment:   In Person  Provider:   You may see Kate Sable, MD or one of the following Advanced Practice Providers on your designated Care Team:    Murray Hodgkins, NP  Christell Faith, PA-C  Marrianne Mood, PA-C  Cadence Moclips, [place]  Laurann Montana, NP

## 2020-10-22 LAB — BLOOD GAS, VENOUS
Acid-Base Excess: 3.2 mmol/L — ABNORMAL HIGH (ref 0.0–2.0)
Bicarbonate: 28.5 mmol/L — ABNORMAL HIGH (ref 20.0–28.0)
O2 Saturation: 44.5 %
Patient temperature: 37
pCO2, Ven: 47 mmHg (ref 44.0–60.0)
pH, Ven: 7.39 (ref 7.250–7.430)

## 2020-10-24 ENCOUNTER — Other Ambulatory Visit: Payer: Self-pay

## 2020-10-24 ENCOUNTER — Emergency Department: Payer: Medicare HMO

## 2020-10-24 ENCOUNTER — Ambulatory Visit
Admission: RE | Admit: 2020-10-24 | Discharge: 2020-10-24 | Disposition: A | Discharge status: Home / Self Care | Payer: Medicare HMO | Source: Ambulatory Visit | Attending: Nephrology | Admitting: Nephrology

## 2020-10-24 ENCOUNTER — Encounter: Payer: Self-pay | Admitting: Emergency Medicine

## 2020-10-24 ENCOUNTER — Emergency Department
Admission: EM | Admit: 2020-10-24 | Discharge: 2020-10-24 | Disposition: A | Discharge status: Home / Self Care | Payer: Medicare HMO | Attending: Student in an Organized Health Care Education/Training Program | Admitting: Student in an Organized Health Care Education/Training Program

## 2020-10-24 DIAGNOSIS — Z8616 Personal history of COVID-19: Secondary | ICD-10-CM | POA: Diagnosis not present

## 2020-10-24 DIAGNOSIS — Z7982 Long term (current) use of aspirin: Secondary | ICD-10-CM | POA: Diagnosis not present

## 2020-10-24 DIAGNOSIS — E11649 Type 2 diabetes mellitus with hypoglycemia without coma: Secondary | ICD-10-CM | POA: Insufficient documentation

## 2020-10-24 DIAGNOSIS — D631 Anemia in chronic kidney disease: Secondary | ICD-10-CM | POA: Insufficient documentation

## 2020-10-24 DIAGNOSIS — N186 End stage renal disease: Secondary | ICD-10-CM | POA: Insufficient documentation

## 2020-10-24 DIAGNOSIS — N189 Chronic kidney disease, unspecified: Secondary | ICD-10-CM | POA: Insufficient documentation

## 2020-10-24 DIAGNOSIS — I132 Hypertensive heart and chronic kidney disease with heart failure and with stage 5 chronic kidney disease, or end stage renal disease: Secondary | ICD-10-CM | POA: Diagnosis not present

## 2020-10-24 DIAGNOSIS — Z992 Dependence on renal dialysis: Secondary | ICD-10-CM | POA: Diagnosis not present

## 2020-10-24 DIAGNOSIS — I509 Heart failure, unspecified: Secondary | ICD-10-CM | POA: Diagnosis not present

## 2020-10-24 DIAGNOSIS — Z79899 Other long term (current) drug therapy: Secondary | ICD-10-CM | POA: Diagnosis not present

## 2020-10-24 DIAGNOSIS — E1122 Type 2 diabetes mellitus with diabetic chronic kidney disease: Secondary | ICD-10-CM | POA: Diagnosis not present

## 2020-10-24 DIAGNOSIS — R0602 Shortness of breath: Secondary | ICD-10-CM | POA: Diagnosis present

## 2020-10-24 LAB — CBC WITH DIFFERENTIAL/PLATELET
Abs Immature Granulocytes: 0.03 10*3/uL (ref 0.00–0.07)
Basophils Absolute: 0.1 10*3/uL (ref 0.0–0.1)
Basophils Relative: 1 %
Eosinophils Absolute: 0.6 10*3/uL — ABNORMAL HIGH (ref 0.0–0.5)
Eosinophils Relative: 7 %
HCT: 26.4 % — ABNORMAL LOW (ref 39.0–52.0)
Hemoglobin: 8.2 g/dL — ABNORMAL LOW (ref 13.0–17.0)
Immature Granulocytes: 0 %
Lymphocytes Relative: 16 %
Lymphs Abs: 1.2 10*3/uL (ref 0.7–4.0)
MCH: 28.5 pg (ref 26.0–34.0)
MCHC: 31.1 g/dL (ref 30.0–36.0)
MCV: 91.7 fL (ref 80.0–100.0)
Monocytes Absolute: 0.5 10*3/uL (ref 0.1–1.0)
Monocytes Relative: 7 %
Neutro Abs: 5.1 10*3/uL (ref 1.7–7.7)
Neutrophils Relative %: 69 %
Platelets: 277 10*3/uL (ref 150–400)
RBC: 2.88 MIL/uL — ABNORMAL LOW (ref 4.22–5.81)
RDW: 17 % — ABNORMAL HIGH (ref 11.5–15.5)
WBC: 7.4 10*3/uL (ref 4.0–10.5)
nRBC: 0 % (ref 0.0–0.2)

## 2020-10-24 LAB — CBC
HCT: 21.6 % — ABNORMAL LOW (ref 39.0–52.0)
Hemoglobin: 6.7 g/dL — ABNORMAL LOW (ref 13.0–17.0)
MCH: 28.3 pg (ref 26.0–34.0)
MCHC: 31 g/dL (ref 30.0–36.0)
MCV: 91.1 fL (ref 80.0–100.0)
Platelets: 243 10*3/uL (ref 150–400)
RBC: 2.37 MIL/uL — ABNORMAL LOW (ref 4.22–5.81)
RDW: 17.2 % — ABNORMAL HIGH (ref 11.5–15.5)
WBC: 6.6 10*3/uL (ref 4.0–10.5)
nRBC: 0 % (ref 0.0–0.2)

## 2020-10-24 LAB — COMPREHENSIVE METABOLIC PANEL
ALT: 10 U/L (ref 0–44)
AST: 15 U/L (ref 15–41)
Albumin: 3.4 g/dL — ABNORMAL LOW (ref 3.5–5.0)
Alkaline Phosphatase: 61 U/L (ref 38–126)
Anion gap: 9 (ref 5–15)
BUN: 17 mg/dL (ref 8–23)
CO2: 24 mmol/L (ref 22–32)
Calcium: 9.6 mg/dL (ref 8.9–10.3)
Chloride: 104 mmol/L (ref 98–111)
Creatinine, Ser: 5.9 mg/dL — ABNORMAL HIGH (ref 0.61–1.24)
GFR, Estimated: 10 mL/min — ABNORMAL LOW (ref >60)
Glucose, Bld: 88 mg/dL (ref 70–99)
Potassium: 4.8 mmol/L (ref 3.5–5.1)
Sodium: 137 mmol/L (ref 135–145)
Total Bilirubin: 0.4 mg/dL (ref 0.3–1.2)
Total Protein: 8.4 g/dL — ABNORMAL HIGH (ref 6.5–8.1)

## 2020-10-24 LAB — TROPONIN I (HIGH SENSITIVITY)
Troponin I (High Sensitivity): 23 ng/L — ABNORMAL HIGH (ref <18)
Troponin I (High Sensitivity): 24 ng/L — ABNORMAL HIGH (ref <18)

## 2020-10-24 LAB — BRAIN NATRIURETIC PEPTIDE: B Natriuretic Peptide: 580.3 pg/mL — ABNORMAL HIGH (ref 0.0–100.0)

## 2020-10-24 LAB — PREPARE RBC (CROSSMATCH)

## 2020-10-24 IMAGING — DX DG CHEST 1V PORT
1 series · 1 of 1 positions shown · non-contrast
Comparison: [DATE]

CLINICAL DATA: Acute shortness of breath during blood transfusion

EXAM:
PORTABLE CHEST 1 VIEW

[chest ap]
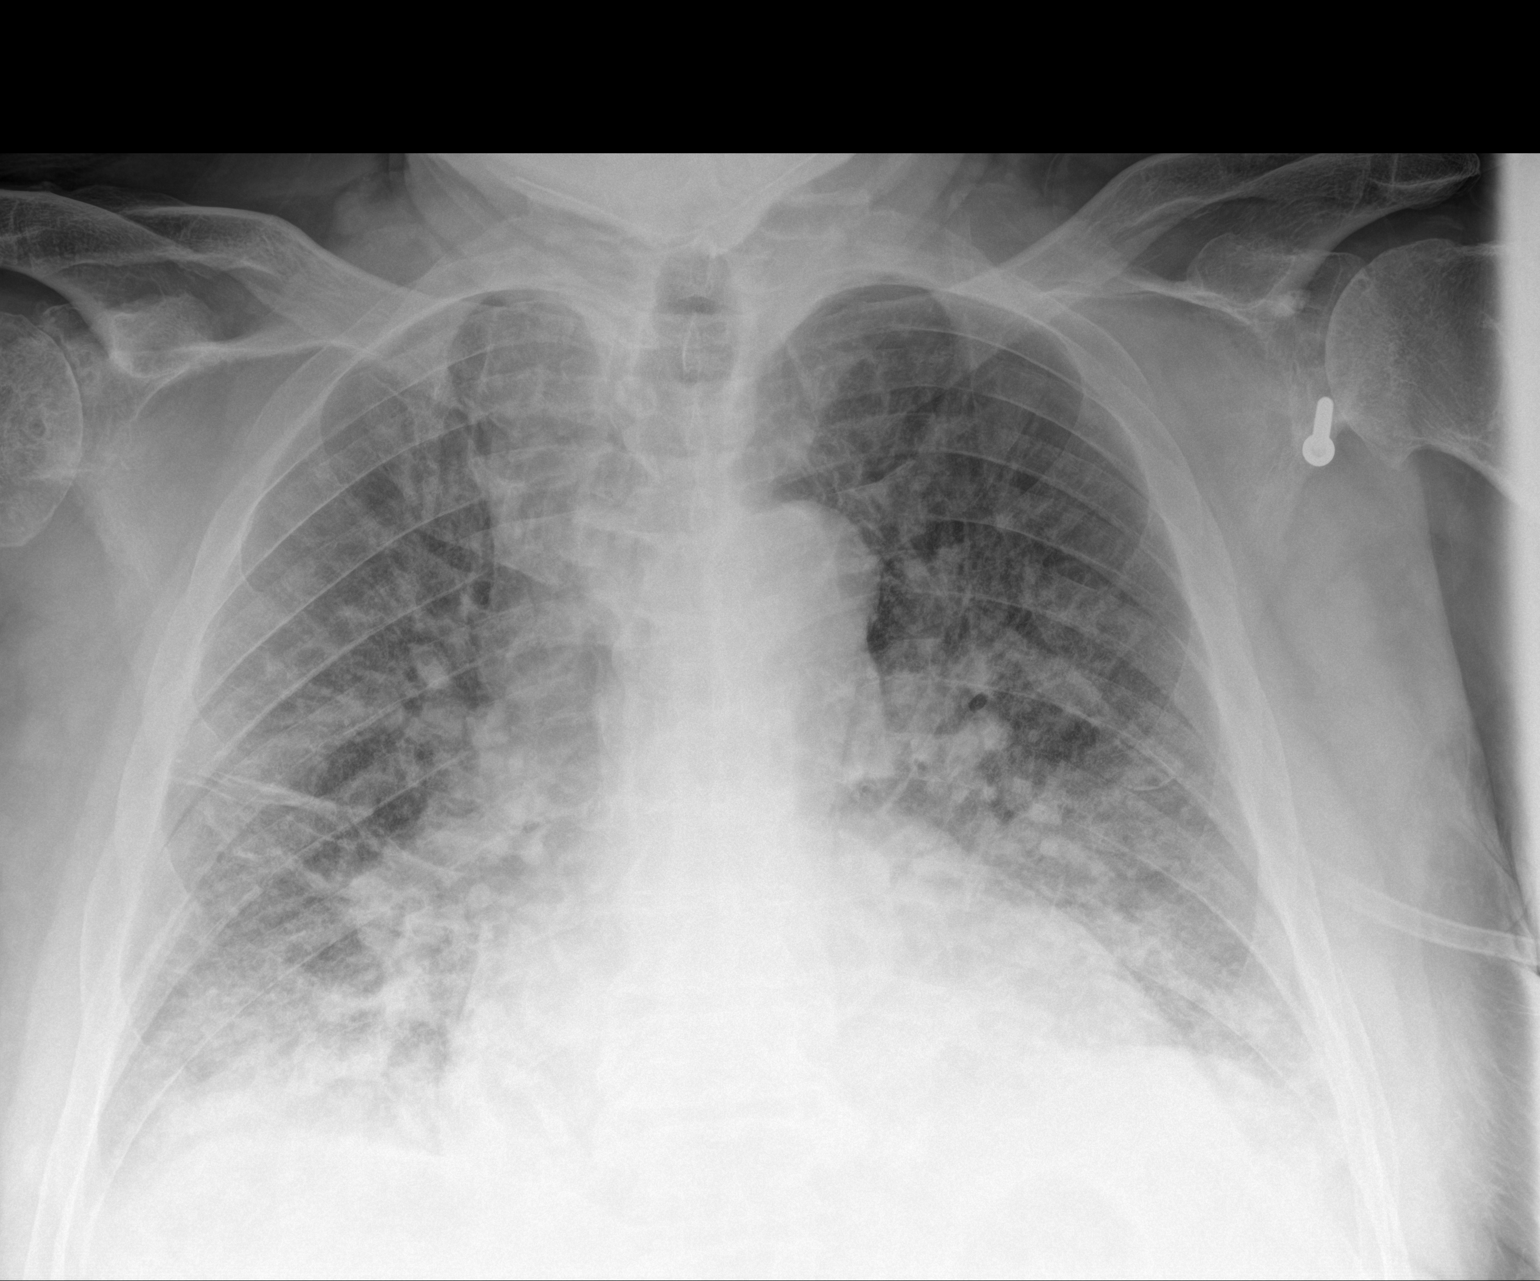

[1 of 1 positions shown; findings below may reference images not displayed]

FINDINGS: Single frontal view of the chest demonstrates stable enlargement the
cardiac silhouette. Multifocal bilateral airspace disease is again
identified. There is been clearing of the consolidation within the
left upper lobe seen previously. New consolidation is seen at the
right lung base, obscuring the right hemidiaphragm. No effusion or
pneumothorax. No acute bony abnormalities.
IMPRESSION: 1. Bilateral airspace disease as above, which could reflect
persistent pneumonia or edema.

## 2020-10-24 MED ORDER — LABETALOL HCL 5 MG/ML IV SOLN
10.0000 mg | Freq: Once | INTRAVENOUS | Status: AC
  Administered 2020-10-24: 10 mg via INTRAVENOUS
  Filled 2020-10-24: qty 4

## 2020-10-24 MED ORDER — HYDRALAZINE HCL 50 MG PO TABS
25.0000 mg | ORAL_TABLET | Freq: Once | ORAL | Status: AC
  Administered 2020-10-24: 25 mg via ORAL
  Filled 2020-10-24: qty 1

## 2020-10-24 MED ORDER — AMLODIPINE BESYLATE 5 MG PO TABS
10.0000 mg | ORAL_TABLET | Freq: Once | ORAL | Status: AC
  Administered 2020-10-24: 10 mg via ORAL
  Filled 2020-10-24: qty 2

## 2020-10-24 MED ORDER — LISINOPRIL 10 MG PO TABS
40.0000 mg | ORAL_TABLET | Freq: Once | ORAL | Status: AC
  Administered 2020-10-24: 40 mg via ORAL
  Filled 2020-10-24: qty 4

## 2020-10-24 NOTE — ED Notes (Signed)
Pt refusing PO medications until his daughter brings him sandwhich
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
## 2020-10-24 NOTE — ED Notes (Signed)
Patient ambulated in room, oxygen 98-93% on RA.

## 2020-10-24 NOTE — ED Notes (Signed)
Pt was placed on supplemental O2 for comfort and work of breathing. Pt is currently on 2 liters via [place]

## 2020-10-24 NOTE — ED Triage Notes (Signed)
Pt to ED, pt was upstairs having a blood transfusion when he became acutely short of breath. Pt states that he is not sure if he has had a transfusion in the past before. Pt SpO2 95% in triage. Pt is tachypneic. Pt denies pain and is afebrile .

## 2020-10-25 LAB — TYPE AND SCREEN
ABO/RH(D): AB POS
Antibody Screen: NEGATIVE
Unit division: 0

## 2020-10-25 LAB — BPAM RBC
Blood Product Expiration Date: 202203202359
ISSUE DATE / TIME: 202202181056
Unit Type and Rh: 6200

## 2020-11-24 ENCOUNTER — Ambulatory Visit: Payer: Medicare Other | Admitting: Internal Medicine

## 2020-11-24 NOTE — Progress Notes (Signed)
Pt did not show for scheduled appointment. Office staff will reach out to reschedule.  

## 2020-12-15 ENCOUNTER — Ambulatory Visit: Payer: Medicare HMO | Admitting: Internal Medicine

## 2020-12-15 NOTE — Progress Notes (Signed)
Pt canceled his appointment for today. Office staff will reach out to reschedule.

## 2020-12-29 ENCOUNTER — Ambulatory Visit: Payer: Medicare HMO | Admitting: Internal Medicine

## 2020-12-29 NOTE — Progress Notes (Signed)
Pt did not show for scheduled appointment. This is the 3rd time and he will not be rescheduled.

## 2021-01-01 ENCOUNTER — Encounter: Payer: Self-pay | Admitting: *Deleted

## 2021-01-02 ENCOUNTER — Other Ambulatory Visit: Payer: Self-pay

## 2021-01-02 ENCOUNTER — Encounter
Admission: RE | Disposition: A | Discharge status: Home / Self Care | Payer: Self-pay | Source: Home / Self Care | Attending: Gastroenterology

## 2021-01-02 ENCOUNTER — Ambulatory Visit
Admission: RE | Admit: 2021-01-02 | Discharge: 2021-01-02 | Disposition: A | Discharge status: Home / Self Care | Payer: Medicare HMO | Attending: Gastroenterology | Admitting: Gastroenterology

## 2021-01-02 HISTORY — DX: Type 2 diabetes mellitus with diabetic nephropathy: E11.21

## 2021-01-02 HISTORY — DX: Enlarged and hypertrophic nails: Q84.5

## 2021-01-02 HISTORY — DX: Hyperlipidemia, unspecified: E78.5

## 2021-01-02 SURGERY — COLONOSCOPY WITH PROPOFOL
Anesthesia: General

## 2021-01-02 MED ORDER — SODIUM CHLORIDE 0.9 % IV SOLN: INTRAVENOUS | Status: DC

## 2021-01-02 NOTE — OR Nursing (Signed)
Patient states not taking the prep and eating all day yesterday prior to the colonoscopy procedure.  Procedure cancelled.

## 2021-04-15 ENCOUNTER — Other Ambulatory Visit: Payer: Self-pay | Admitting: *Deleted

## 2021-04-15 MED ORDER — LISINOPRIL 40 MG PO TABS: 40.0000 mg | ORAL_TABLET | Freq: Every day | ORAL | 0 refills | Status: DC

## 2021-04-20 ENCOUNTER — Encounter: Payer: Self-pay | Admitting: Cardiology

## 2021-04-20 ENCOUNTER — Other Ambulatory Visit: Payer: Self-pay

## 2021-04-20 ENCOUNTER — Ambulatory Visit (INDEPENDENT_AMBULATORY_CARE_PROVIDER_SITE_OTHER): Payer: Medicare Other | Admitting: Cardiology

## 2021-04-20 VITALS — BP 180/90 | HR 122 | Ht 71.0 in | Wt 239.0 lb

## 2021-04-20 DIAGNOSIS — I10 Essential (primary) hypertension: Secondary | ICD-10-CM | POA: Diagnosis not present

## 2021-04-20 DIAGNOSIS — R0602 Shortness of breath: Secondary | ICD-10-CM
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
## 2021-04-20 NOTE — Patient Instructions (Signed)
Medication Instructions:  Your physician recommends that you continue on your current medications as directed. Please refer to the Current Medication list given to you today.  *If you need a refill on your cardiac medications before your next appointment, please call your pharmacy*   Lab Work: None ordered If you have labs (blood work) drawn today and your tests are completely normal, you will receive your results only by: MyChart Message (if you have MyChart) OR A paper copy in the mail If you have any lab test that is abnormal or we need to change your treatment, we will call you to review the results.   Testing/Procedures: None ordered   Follow-Up: At [facility], you and your health needs are our priority.  As part of our continuing mission to provide you with exceptional heart care, we have created designated Provider Care Teams.  These Care Teams include your primary Cardiologist (physician) and Advanced Practice Providers (APPs -  Physician Assistants and Nurse Practitioners) who all work together to provide you with the care you need, when you need it.  We recommend signing up for the patient portal called "MyChart".  Sign up information is provided on this After Visit Summary.  MyChart is used to connect with patients for Virtual Visits (Telemedicine).  Patients are able to view lab/test results, encounter notes, upcoming appointments, etc.  Non-urgent messages can be sent to your provider as well.   To learn more about what you can do with MyChart, go to [email or web address].    Your next appointment:   6 month(s)  The format for your next appointment:   In Person  Provider:   You may see Brian Agbor-Etang, MD or one of the following Advanced Practice Providers on your designated Care Team:   Christopher Berge, NP Ryan Dunn, PA-C Jacquelyn Visser, PA-C Cadence Furth, PA-C   Other Instructions   

## 2021-04-28 ENCOUNTER — Emergency Department: Payer: Medicare Other

## 2021-04-28 ENCOUNTER — Observation Stay
Admission: EM | Admit: 2021-04-28 | Discharge: 2021-04-29 | Disposition: A | Discharge status: Home / Self Care | Payer: Medicare Other | Attending: Internal Medicine | Admitting: Internal Medicine

## 2021-04-28 ENCOUNTER — Other Ambulatory Visit: Payer: Self-pay

## 2021-04-28 DIAGNOSIS — I5030 Unspecified diastolic (congestive) heart failure: Secondary | ICD-10-CM | POA: Diagnosis not present

## 2021-04-28 DIAGNOSIS — Z7982 Long term (current) use of aspirin: Secondary | ICD-10-CM | POA: Diagnosis not present

## 2021-04-28 DIAGNOSIS — J45909 Unspecified asthma, uncomplicated: Secondary | ICD-10-CM | POA: Insufficient documentation

## 2021-04-28 DIAGNOSIS — I132 Hypertensive heart and chronic kidney disease with heart failure and with stage 5 chronic kidney disease, or end stage renal disease: Secondary | ICD-10-CM | POA: Diagnosis not present

## 2021-04-28 DIAGNOSIS — I160 Hypertensive urgency: Secondary | ICD-10-CM | POA: Diagnosis present

## 2021-04-28 DIAGNOSIS — Z992 Dependence on renal dialysis: Secondary | ICD-10-CM | POA: Insufficient documentation

## 2021-04-28 DIAGNOSIS — Z8616 Personal history of COVID-19: Secondary | ICD-10-CM | POA: Diagnosis not present

## 2021-04-28 DIAGNOSIS — E8770 Fluid overload, unspecified: Secondary | ICD-10-CM | POA: Diagnosis present

## 2021-04-28 DIAGNOSIS — E8779 Other fluid overload: Principal | ICD-10-CM

## 2021-04-28 DIAGNOSIS — E1169 Type 2 diabetes mellitus with other specified complication: Secondary | ICD-10-CM | POA: Diagnosis present

## 2021-04-28 DIAGNOSIS — N186 End stage renal disease: Secondary | ICD-10-CM | POA: Insufficient documentation

## 2021-04-28 DIAGNOSIS — Z20822 Contact with and (suspected) exposure to covid-19: Secondary | ICD-10-CM | POA: Insufficient documentation

## 2021-04-28 DIAGNOSIS — E1122 Type 2 diabetes mellitus with diabetic chronic kidney disease: Secondary | ICD-10-CM | POA: Insufficient documentation

## 2021-04-28 DIAGNOSIS — R0602 Shortness of breath: Secondary | ICD-10-CM | POA: Diagnosis present

## 2021-04-28 DIAGNOSIS — Z79899 Other long term (current) drug therapy: Secondary | ICD-10-CM | POA: Insufficient documentation

## 2021-04-28 LAB — BASIC METABOLIC PANEL
Anion gap: 9 (ref 5–15)
BUN: 22 mg/dL (ref 8–23)
CO2: 25 mmol/L (ref 22–32)
Calcium: 8.9 mg/dL (ref 8.9–10.3)
Chloride: 101 mmol/L (ref 98–111)
Creatinine, Ser: 6.61 mg/dL — ABNORMAL HIGH (ref 0.61–1.24)
GFR, Estimated: 9 mL/min — ABNORMAL LOW (ref >60)
Glucose, Bld: 101 mg/dL — ABNORMAL HIGH (ref 70–99)
Potassium: 4 mmol/L (ref 3.5–5.1)
Sodium: 135 mmol/L (ref 135–145)

## 2021-04-28 LAB — CBC
HCT: 25.6 % — ABNORMAL LOW (ref 39.0–52.0)
Hemoglobin: 8.2 g/dL — ABNORMAL LOW (ref 13.0–17.0)
MCH: 28 pg (ref 26.0–34.0)
MCHC: 32 g/dL (ref 30.0–36.0)
MCV: 87.4 fL (ref 80.0–100.0)
Platelets: 270 10*3/uL (ref 150–400)
RBC: 2.93 MIL/uL — ABNORMAL LOW (ref 4.22–5.81)
RDW: 17.1 % — ABNORMAL HIGH (ref 11.5–15.5)
WBC: 5.8 10*3/uL (ref 4.0–10.5)
nRBC: 0 % (ref 0.0–0.2)

## 2021-04-28 LAB — TROPONIN I (HIGH SENSITIVITY)
Troponin I (High Sensitivity): 43 ng/L — ABNORMAL HIGH (ref <18)
Troponin I (High Sensitivity): 48 ng/L — ABNORMAL HIGH (ref <18)

## 2021-04-28 LAB — BRAIN NATRIURETIC PEPTIDE: B Natriuretic Peptide: 2177 pg/mL — ABNORMAL HIGH (ref 0.0–100.0)

## 2021-04-28 IMAGING — CR DG CHEST 2V
2 series · 2 of 2 positions shown · non-contrast
Comparison: [DATE]

CLINICAL DATA: Shortness of breath, dialysis patient, tachypnea,
diabetes mellitus, hypertension

EXAM:
CHEST - 2 VIEW

[chest lat]
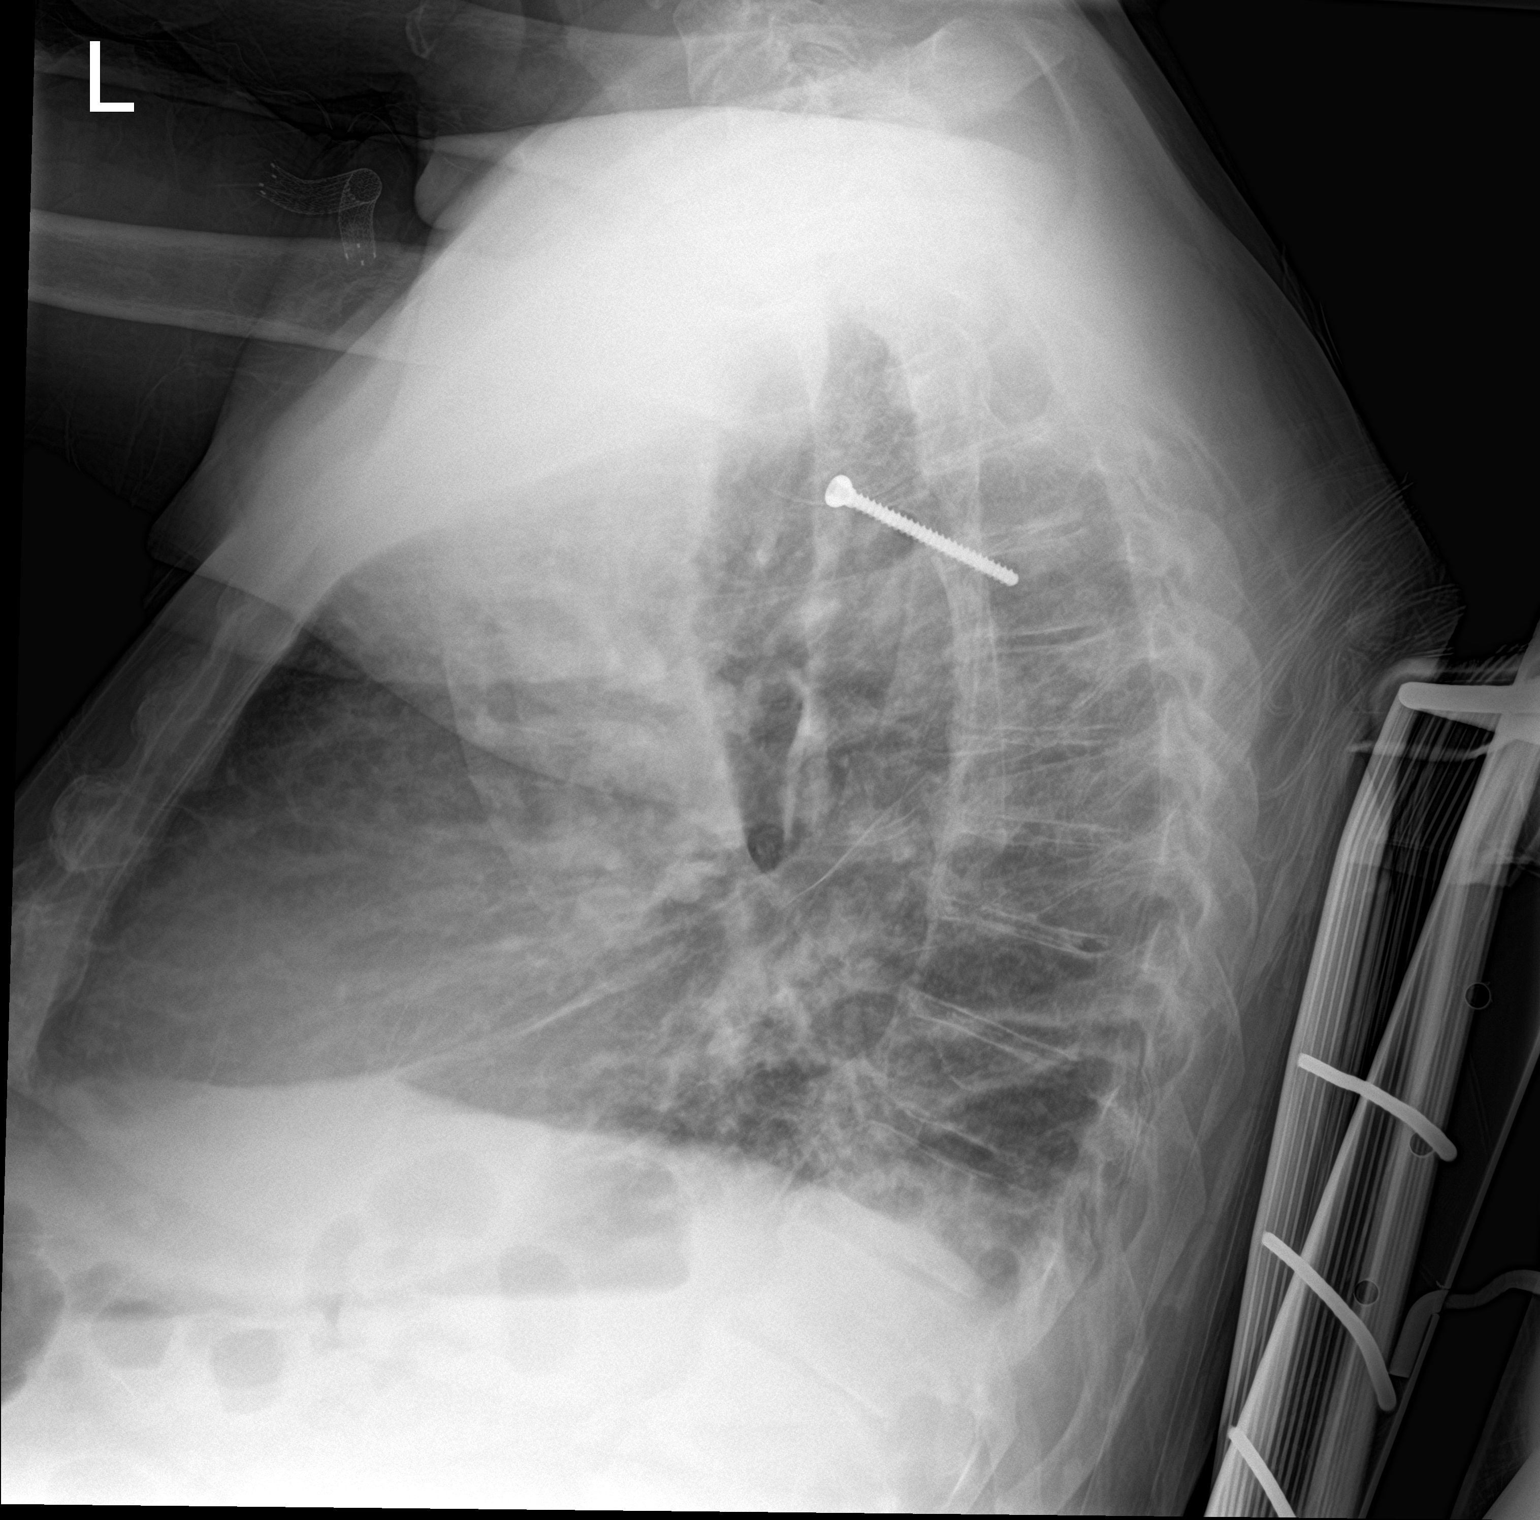

[chest ap]
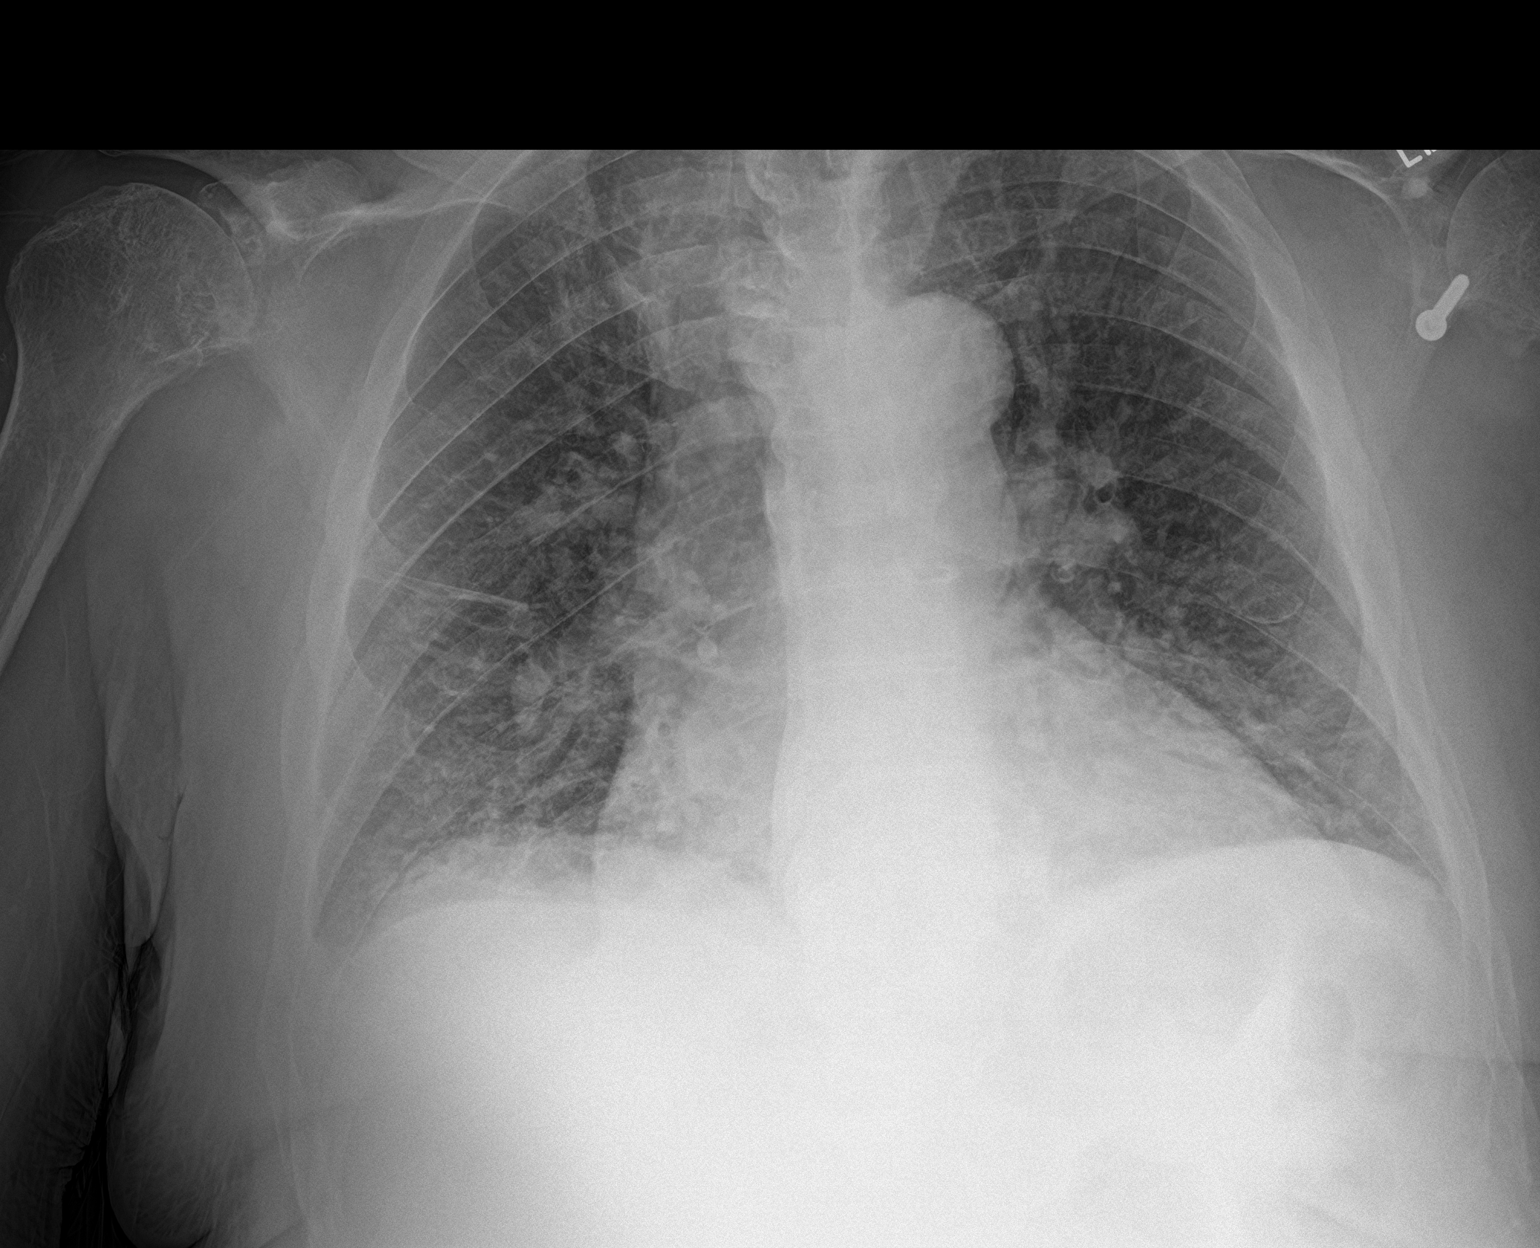

[2 of 2 positions shown; findings below may reference images not displayed]

FINDINGS: Enlargement of cardiac silhouette with pulmonary vascular
congestion.

Mediastinal contours normal.

Scattered interstitial infiltrates likely representing pulmonary
edema, less severe than on previous exam.

Tiny RIGHT pleural effusion.

No segmental consolidation or pneumothorax.

Osseous demineralization with prior LEFT shoulder surgery and RIGHT
shoulder glenohumeral degenerative changes.
IMPRESSION: Enlargement of cardiac silhouette with pulmonary vascular congestion
and probable interstitial edema.

## 2021-04-28 MED ORDER — SODIUM CHLORIDE 0.9 % IV SOLN: 100.0000 mL | INTRAVENOUS | Status: DC | PRN

## 2021-04-28 MED ORDER — FUROSEMIDE 10 MG/ML IJ SOLN
120.0000 mg | Freq: Once | INTRAVENOUS | Status: AC
  Administered 2021-04-28: 120 mg via INTRAVENOUS
  Filled 2021-04-28: qty 12

## 2021-04-28 MED ORDER — HYDRALAZINE HCL 50 MG PO TABS
25.0000 mg | ORAL_TABLET | Freq: Three times a day (TID) | ORAL | Status: DC
  Administered 2021-04-29: 25 mg via ORAL
  Filled 2021-04-28: qty 1

## 2021-04-28 MED ORDER — ONDANSETRON HCL 4 MG PO TABS: 4.0000 mg | ORAL_TABLET | Freq: Four times a day (QID) | ORAL | Status: DC | PRN

## 2021-04-28 MED ORDER — LABETALOL HCL 5 MG/ML IV SOLN: 10.0000 mg | INTRAVENOUS | Status: DC

## 2021-04-28 MED ORDER — AMLODIPINE BESYLATE 5 MG PO TABS
10.0000 mg | ORAL_TABLET | Freq: Once | ORAL | Status: AC
  Administered 2021-04-28: 10 mg via ORAL
  Filled 2021-04-28: qty 2

## 2021-04-28 MED ORDER — LABETALOL HCL 5 MG/ML IV SOLN
10.0000 mg | INTRAVENOUS | Status: DC | PRN
  Administered 2021-04-29: 10 mg via INTRAVENOUS
  Filled 2021-04-28: qty 4

## 2021-04-28 MED ORDER — ACETAMINOPHEN 500 MG PO TABS
1000.0000 mg | ORAL_TABLET | Freq: Four times a day (QID) | ORAL | Status: DC | PRN
  Administered 2021-04-29: 1000 mg via ORAL
  Filled 2021-04-28: qty 2

## 2021-04-28 MED ORDER — LISINOPRIL 10 MG PO TABS
40.0000 mg | ORAL_TABLET | Freq: Every day | ORAL | Status: DC
  Administered 2021-04-29: 40 mg via ORAL
  Filled 2021-04-28: qty 4

## 2021-04-28 MED ORDER — HYDRALAZINE HCL 50 MG PO TABS
25.0000 mg | ORAL_TABLET | Freq: Once | ORAL | Status: AC
  Administered 2021-04-28: 25 mg via ORAL
  Filled 2021-04-28: qty 1

## 2021-04-28 MED ORDER — ACETAMINOPHEN 650 MG RE SUPP: 650.0000 mg | Freq: Four times a day (QID) | RECTAL | Status: DC | PRN

## 2021-04-28 MED ORDER — INSULIN ASPART 100 UNIT/ML IJ SOLN: 0.0000 [IU] | Freq: Three times a day (TID) | INTRAMUSCULAR | Status: DC

## 2021-04-28 MED ORDER — LIDOCAINE-PRILOCAINE 2.5-2.5 % EX CREA: 1.0000 "application " | TOPICAL_CREAM | CUTANEOUS | Status: DC | PRN

## 2021-04-28 MED ORDER — SODIUM CHLORIDE 0.9% FLUSH
3.0000 mL | Freq: Two times a day (BID) | INTRAVENOUS | Status: DC
  Administered 2021-04-29 (×2): 3 mL via INTRAVENOUS

## 2021-04-28 MED ORDER — HYDROCHLOROTHIAZIDE 25 MG PO TABS: 25.0000 mg | ORAL_TABLET | Freq: Every day | ORAL | Status: DC

## 2021-04-28 MED ORDER — FAMOTIDINE 20 MG PO TABS
20.0000 mg | ORAL_TABLET | Freq: Every day | ORAL | Status: DC
  Administered 2021-04-29: 20 mg via ORAL
  Filled 2021-04-28: qty 1

## 2021-04-28 MED ORDER — BENZONATATE 100 MG PO CAPS: 100.0000 mg | ORAL_CAPSULE | Freq: Three times a day (TID) | ORAL | Status: DC | PRN

## 2021-04-28 MED ORDER — METOPROLOL TARTRATE 50 MG PO TABS
50.0000 mg | ORAL_TABLET | Freq: Two times a day (BID) | ORAL | Status: DC
  Administered 2021-04-29 (×2): 50 mg via ORAL
  Filled 2021-04-28 (×2): qty 1

## 2021-04-28 MED ORDER — HEPARIN SODIUM (PORCINE) 1000 UNIT/ML DIALYSIS
1000.0000 [IU] | INTRAMUSCULAR | Status: DC | PRN
  Filled 2021-04-28: qty 1

## 2021-04-28 MED ORDER — ASPIRIN EC 81 MG PO TBEC
81.0000 mg | DELAYED_RELEASE_TABLET | Freq: Every day | ORAL | Status: DC
  Administered 2021-04-29: 81 mg via ORAL
  Filled 2021-04-28: qty 1

## 2021-04-28 MED ORDER — ONDANSETRON HCL 4 MG/2ML IJ SOLN: 4.0000 mg | Freq: Four times a day (QID) | INTRAMUSCULAR | Status: DC | PRN

## 2021-04-28 MED ORDER — HEPARIN SODIUM (PORCINE) 5000 UNIT/ML IJ SOLN
5000.0000 [IU] | Freq: Three times a day (TID) | INTRAMUSCULAR | Status: DC
  Administered 2021-04-29 (×2): 5000 [IU] via SUBCUTANEOUS
  Filled 2021-04-28 (×2): qty 1

## 2021-04-28 MED ORDER — LISINOPRIL 10 MG PO TABS
40.0000 mg | ORAL_TABLET | Freq: Once | ORAL | Status: AC
  Administered 2021-04-28: 40 mg via ORAL
  Filled 2021-04-28: qty 4

## 2021-04-28 MED ORDER — SENNOSIDES-DOCUSATE SODIUM 8.6-50 MG PO TABS: 1.0000 | ORAL_TABLET | Freq: Every evening | ORAL | Status: DC | PRN

## 2021-04-28 MED ORDER — ALBUTEROL SULFATE HFA 108 (90 BASE) MCG/ACT IN AERS: 1.0000 | INHALATION_SPRAY | RESPIRATORY_TRACT | Status: DC | PRN

## 2021-04-28 MED ORDER — ATORVASTATIN CALCIUM 20 MG PO TABS
10.0000 mg | ORAL_TABLET | Freq: Every day | ORAL | Status: DC
  Administered 2021-04-29: 10 mg via ORAL
  Filled 2021-04-28: qty 1

## 2021-04-28 MED ORDER — AMLODIPINE BESYLATE 5 MG PO TABS
10.0000 mg | ORAL_TABLET | Freq: Every day | ORAL | Status: DC
  Administered 2021-04-29: 10 mg via ORAL
  Filled 2021-04-28: qty 2

## 2021-04-28 NOTE — ED Notes (Signed)
Reviewed pt's labs; acuity level changed

## 2021-04-28 NOTE — ED Triage Notes (Signed)
Pt comes into the ED POV from home with SOB for the past couple of weeks, pt is dialysis T,Th,Sat. Pt is tachypnic in triage

## 2021-04-28 NOTE — Progress Notes (Signed)
[facility]  ROUNDING NOTE   Subjective:   Mr. Johnpatrick Jenny was admitted to [facility] on 04/28/2021 for SOB  Last hemodialysis treatment was earlier today. He was taken off treatment 30 minutes early and was 1kg above his dry weight on leaving.   Patient presents with increasing shortness of breath, pulmonary edema and requiring oxygen. Patient with hypertensive urgency.    Objective:  Vital signs in last 24 hours:  Temp:  [98.4 F (36.9 C)] 98.4 F (36.9 C) (08/23 1811) Pulse Rate:  [116-117] 117 (08/23 2100) Resp:  [18-24] 18 (08/23 2100) BP: (175-191)/(91-112) 191/112 (08/23 2106) SpO2:  [100 %] 100 % (08/23 2100) Weight:  [106.1 kg] 106.1 kg (08/23 1811)  Weight change:  Filed Weights   04/28/21 1811  Weight: 106.1 kg    Intake/Output: No intake/output data recorded.   Intake/Output this shift:  No intake/output data recorded.  Physical Exam: General: Ill appearing, in respiratory distress  Head: Normocephalic, atraumatic. Moist oral mucosal membranes  Eyes: Anicteric, PERRL  Neck: Supple, trachea midline  Lungs:  Bilateral crackles bilaterally, [place] O2   Heart: Regular rate and rhythm  Abdomen:  Soft, nontender,   Extremities:  +peripheral edema.  Neurologic: Nonfocal, moving all four extremities  Skin: No lesions  Access: Left AVF +thrill +bruit    Basic Metabolic Panel: Recent Labs  Lab 04/28/21 1816  NA 135  K 4.0  CL 101  CO2 25  GLUCOSE 101*  BUN 22  CREATININE 6.61*  CALCIUM 8.9    Liver Function Tests: No results for input(s): AST, ALT, ALKPHOS, BILITOT, PROT, ALBUMIN in the last 168 hours. No results for input(s): LIPASE, AMYLASE in the last 168 hours. No results for input(s): AMMONIA in the last 168 hours.  CBC: Recent Labs  Lab 04/28/21 1816  WBC 5.8  HGB 8.2*  HCT 25.6*  MCV 87.4  PLT 270    Cardiac Enzymes: No results for input(s): CKTOTAL, CKMB, CKMBINDEX, TROPONINI in the last 168 hours.  BNP: Invalid  input(s): POCBNP  CBG: No results for input(s): GLUCAP in the last 168 hours.  Microbiology: Results for orders placed or performed during the hospital encounter of 10/06/20  MRSA PCR Screening     Status: None   Collection Time: 10/08/20  6:10 AM   Specimen: Nasal Mucosa; Nasopharyngeal  Result Value Ref Range Status   MRSA by PCR NEGATIVE NEGATIVE Final    Comment:        The GeneXpert MRSA Assay (FDA approved for NASAL specimens only), is one component of a comprehensive MRSA colonization surveillance program. It is not intended to diagnose MRSA infection nor to guide or monitor treatment for MRSA infections. Performed at [facility], [address] [place],  [address]     Coagulation Studies: No results for input(s): LABPROT, INR in the last 72 hours.  Urinalysis: No results for input(s): COLORURINE, LABSPEC, PHURINE, GLUCOSEU, HGBUR, BILIRUBINUR, KETONESUR, PROTEINUR, UROBILINOGEN, NITRITE, LEUKOCYTESUR in the last 72 hours.  Invalid input(s): APPERANCEUR    Imaging: DG Chest 2 View  Result Date: 04/28/2021 CLINICAL DATA:  Shortness of breath, dialysis patient, tachypnea, diabetes mellitus, hypertension EXAM: CHEST - 2 VIEW COMPARISON:  10/24/2020 FINDINGS: Enlargement of cardiac silhouette with pulmonary vascular congestion. Mediastinal contours normal. Scattered interstitial infiltrates likely representing pulmonary edema, less severe than on previous exam. Tiny RIGHT pleural effusion. No segmental consolidation or pneumothorax. Osseous demineralization with prior LEFT shoulder surgery and RIGHT shoulder glenohumeral degenerative changes. IMPRESSION: Enlargement of cardiac silhouette with  pulmonary vascular congestion and probable interstitial edema. Electronically Signed   By: Lavonia Dana M.D.   On: 04/28/2021 18:56     Medications:    furosemide 120 mg (04/28/21 2135)      Assessment/ Plan:  Mr. Koven Belinsky is a 60 y.o. black male with  end stage renal disease on hemodialysis, hypertension, congestive heart failure, diabetes mellitus type II, diabetic neuropathy, hyperlipdiemia, who presents to [facility] ED on 04/28/2021 for SOB  [facility] TTS [facility] Left AVF 107.5kg  End Stage Renal Disease: with hypertensive urgency, pulmonary edema and volume overload. Patient requiring supplemental oxygen.  - Emergent hemodialysis treatment tonight. Orders prepared.   Hypertension: with urgency. He states he just took his medications. Seems that his blood pressures have been elevated at dialysis treatments as well. Home regimen of amlodipine, hydralazine, metoprolol and lisinopril.   Anemia of chronic kidney disease: hemoglobin 8.2. Due to hypertension, will hold ESA  Secondary Hyperparathyroidism: calcium acetate 3 tabs with meals.    LOS: 0 Letricia Krinsky 8/23/202210:00 PM

## 2021-04-28 NOTE — ED Notes (Signed)
Pt called from wheelchair stating he needs oxygen.  When asked if he uses it at home he states "no, but I need it".  Pts O2 sats 100% with a 116 heart rate.  1st nurse notified.

## 2021-04-28 NOTE — Progress Notes (Signed)
Pt moving accessed arm nearly constantly, reaching under body getting lines tangled under himself, pt encouraged to please keep left arm still while dialysis is occurring. No s/s of infiltration noted to avf at this time.
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
## 2021-04-28 NOTE — ED Notes (Signed)
Dialysis RN, April at bedside. Pt assisted and positioned into hospital bed for comfort as well as dialysis.
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
## 2021-04-29 DIAGNOSIS — I160 Hypertensive urgency: Secondary | ICD-10-CM | POA: Diagnosis not present

## 2021-04-29 DIAGNOSIS — N186 End stage renal disease: Secondary | ICD-10-CM

## 2021-04-29 DIAGNOSIS — E785 Hyperlipidemia, unspecified: Secondary | ICD-10-CM

## 2021-04-29 DIAGNOSIS — E1169 Type 2 diabetes mellitus with other specified complication: Secondary | ICD-10-CM

## 2021-04-29 DIAGNOSIS — Z992 Dependence on renal dialysis: Secondary | ICD-10-CM

## 2021-04-29 DIAGNOSIS — E8779 Other fluid overload: Secondary | ICD-10-CM | POA: Diagnosis not present

## 2021-04-29 LAB — CBC
HCT: 26.7 % — ABNORMAL LOW (ref 39.0–52.0)
Hemoglobin: 8.9 g/dL — ABNORMAL LOW (ref 13.0–17.0)
MCH: 29.7 pg (ref 26.0–34.0)
MCHC: 33.3 g/dL (ref 30.0–36.0)
MCV: 89 fL (ref 80.0–100.0)
Platelets: 259 10*3/uL (ref 150–400)
RBC: 3 MIL/uL — ABNORMAL LOW (ref 4.22–5.81)
RDW: 17 % — ABNORMAL HIGH (ref 11.5–15.5)
WBC: 5.6 10*3/uL (ref 4.0–10.5)
nRBC: 0 % (ref 0.0–0.2)

## 2021-04-29 LAB — BASIC METABOLIC PANEL
Anion gap: 10 (ref 5–15)
BUN: 16 mg/dL (ref 8–23)
CO2: 28 mmol/L (ref 22–32)
Calcium: 8.8 mg/dL — ABNORMAL LOW (ref 8.9–10.3)
Chloride: 100 mmol/L (ref 98–111)
Creatinine, Ser: 4.81 mg/dL — ABNORMAL HIGH (ref 0.61–1.24)
GFR, Estimated: 13 mL/min — ABNORMAL LOW (ref >60)
Glucose, Bld: 88 mg/dL (ref 70–99)
Potassium: 3.6 mmol/L (ref 3.5–5.1)
Sodium: 138 mmol/L (ref 135–145)

## 2021-04-29 LAB — GLUCOSE, CAPILLARY
Glucose-Capillary: 79 mg/dL (ref 70–99)
Glucose-Capillary: 105 mg/dL — ABNORMAL HIGH (ref 70–99)

## 2021-04-29 LAB — HEMOGLOBIN A1C
Hgb A1c MFr Bld: 5.6 % (ref 4.8–5.6)
Mean Plasma Glucose: 114.02 mg/dL

## 2021-04-29 LAB — HEPATITIS B SURFACE ANTIGEN: Hepatitis B Surface Ag: NONREACTIVE

## 2021-04-29 LAB — HEPATITIS C ANTIBODY: HCV Ab: REACTIVE — AB

## 2021-04-29 LAB — RESP PANEL BY RT-PCR (FLU A&B, COVID) ARPGX2
Influenza A by PCR: NEGATIVE
Influenza B by PCR: NEGATIVE
SARS Coronavirus 2 by RT PCR: NEGATIVE

## 2021-04-29 LAB — HEPATITIS B CORE ANTIBODY, TOTAL: Hep B Core Total Ab: REACTIVE — AB

## 2021-04-29 LAB — HEPATITIS B SURFACE ANTIBODY,QUALITATIVE: Hep B S Ab: NONREACTIVE

## 2021-04-29 MED ORDER — ALBUTEROL SULFATE (2.5 MG/3ML) 0.083% IN NEBU: 2.5000 mg | INHALATION_SOLUTION | RESPIRATORY_TRACT | Status: DC | PRN

## 2021-04-29 MED ORDER — IPRATROPIUM-ALBUTEROL 0.5-2.5 (3) MG/3ML IN SOLN: 3.0000 mL | Freq: Four times a day (QID) | RESPIRATORY_TRACT | 0 refills | Status: DC | PRN

## 2021-04-29 MED ORDER — IPRATROPIUM-ALBUTEROL 0.5-2.5 (3) MG/3ML IN SOLN: 3.0000 mL | Freq: Four times a day (QID) | RESPIRATORY_TRACT | Status: DC | PRN

## 2021-04-29 NOTE — Discharge Summary (Signed)
Physician Discharge Summary  Steve Vance YPP:[id number] DOB: 02-21-1961 DOA: 04/28/2021  PCP: Theotis Burrow, MD  Admit date: 04/28/2021 Discharge date: 04/29/2021  Admitted From: Home  Disposition:  home   Recommendations for Outpatient Follow-up and new medication changes:  Follow up with Dr. Alene Mires in 7 to 10 days.  Follow with HD outpatient tomorrow.   Home Health: no   Equipment/Devices: no    Discharge Condition: stable  CODE STATUS:  DNR  Diet recommendation:  heart healthy and renal prudent.   Brief/Interim Summary: Steve Vance was admitted to the hospital with the working diagnosis of volume overload and acute pulmonary edema in the setting of ESRD on HD.   60 year old male past medical history for end-stage renal disease on hemodialysis,(TTS), hypertension, hepatitis C, dyslipidemia and anemia of chronic disease who presented with dyspnea.  Reported worsening shortness of breath for the last few days, associated with productive cough.  He had severe SARS COVID-19 pneumonia 2022.  He ended his last hemodialysis session early because he wanted to follow-up with his primary care.  His dyspnea became severe and he presented to the hospital for further evaluation.  On his initial physical examination his blood pressure was 191/112, 194/109 heart rate 117, respiratory rate 20, temperature 98.4, oxygen saturation 100%, his lungs had rales bilaterally at bases, heart S1-S2, present, tachycardic, his abdomen was soft nontender, positive lower extremity edema bilaterally.  Sodium 135, potassium 4.0, chloride 1 1, bicarb 25, glucose 101, BUN 22, creatinine 6.61, BNP 2000 177, high sensitive troponin 43-48, white count 5.8, hemoglobin 8.2, hematocrit 25.6, platelets 270. SARS COVID-19 negative.  Chest radiograph with hilar vascular congestion, positive interstitial edema.  EKG 118 bpm, left axis deviation, left anterior fascicular block, QTC 515, junctional rhythm, no significant  ST segment or T wave changes.  Patient underwent emergent hemodialysis, ultrafiltration of 3 L with significant improvement of his symptoms.  ESRD on HD.  After hemodialysis on 3 L of ultrafiltration his symptoms remarkably improved. His oxygenation is 100% on room air and his blood pressure is 168/79  Plan to continue hemodialysis as an outpatient, he was instructed to continue compliance with his outpatient hemodialysis treatments. Continue with calcium acetate.   2.  Type 2 diabetes mellitus/ dyslipidemia His glucose remained well controlled, he received insulin sliding scale during his hospitalization Continue with statin therapy.   3.  Hypertension.  His blood pressure has improved, continue amlodipine, labetaolol, lisinopril and hydralazine. HCTZ.   4.  Asthma.  No signs of acute exacerbation, patient has requested a nebulizer machine to use at home as needed.  Discharge Diagnoses:  Principal Problem:   Volume overload Active Problems:   Type 2 diabetes mellitus with hyperlipidemia (HCC)   ESRD on dialysis [facility])   Hypertensive urgency    Discharge Instructions   Allergies as of 04/29/2021       Reactions   Baclofen Nausea And Vomiting   Gabapentin Nausea And Vomiting   Oxycodone-acetaminophen Nausea And Vomiting        Medication List     STOP taking these medications    benzonatate 100 MG capsule Commonly known as: TESSALON   HYDROcodone-acetaminophen 5-325 MG tablet Commonly known as: Norco   metoprolol tartrate 25 MG tablet Commonly known as: LOPRESSOR       TAKE these medications    Accu-Chek FastClix Lancets Misc USE TO CHECK BLOOD SUGAR UP TO 4 TIMES DAILY AS DIRECTED   albuterol 108 (90 Base) MCG/ACT inhaler Commonly known as: VENTOLIN  HFA Inhale 1-2 puffs into the lungs every 4 (four) hours as needed for wheezing or shortness of breath.   amLODipine 10 MG tablet Commonly known as: NORVASC Take 1 tablet (10 mg total) by mouth daily.    aspirin 81 MG EC tablet Take 1 tablet (81 mg total) by mouth daily.   atorvastatin 10 MG tablet Commonly known as: LIPITOR Take 10 mg by mouth daily.   blood glucose meter kit and supplies Kit Dispense based on patient and insurance preference. Use up to four times daily as directed. (FOR ICD-9 250.00, 250.01).   calcium acetate 667 MG capsule Commonly known as: PHOSLO Take by mouth 3 (three) times daily with meals.   famotidine 20 MG tablet Commonly known as: PEPCID Take 1 tablet (20 mg total) by mouth daily.   hydrALAZINE 25 MG tablet Commonly known as: APRESOLINE Take 1 tablet (25 mg total) by mouth every 8 (eight) hours.   hydrochlorothiazide 25 MG tablet Commonly known as: HYDRODIURIL Take 25 mg by mouth daily.   labetalol 200 MG tablet Commonly known as: NORMODYNE Take 200 mg by mouth 2 (two) times daily.   lisinopril 40 MG tablet Commonly known as: ZESTRIL Take 1 tablet (40 mg total) by mouth daily.        Allergies  Allergen Reactions   Baclofen Nausea And Vomiting   Gabapentin Nausea And Vomiting   Oxycodone-Acetaminophen Nausea And Vomiting    Consultations: Nephrology    Procedures/Studies: DG Chest 2 View  Result Date: 04/28/2021 CLINICAL DATA:  Shortness of breath, dialysis patient, tachypnea, diabetes mellitus, hypertension EXAM: CHEST - 2 VIEW COMPARISON:  10/24/2020 FINDINGS: Enlargement of cardiac silhouette with pulmonary vascular congestion. Mediastinal contours normal. Scattered interstitial infiltrates likely representing pulmonary edema, less severe than on previous exam. Tiny RIGHT pleural effusion. No segmental consolidation or pneumothorax. Osseous demineralization with prior LEFT shoulder surgery and RIGHT shoulder glenohumeral degenerative changes. IMPRESSION: Enlargement of cardiac silhouette with pulmonary vascular congestion and probable interstitial edema. Electronically Signed   By: Lavonia Dana M.D.   On: 04/28/2021 18:56       Subjective: Patient is feeling better, dyspnea has improved, no nausea or vomiting. No chest pain or edema.   Discharge Exam: Vitals:   04/29/21 0623 04/29/21 0801  BP: (!) 157/104 (!) 168/79  Pulse: (!) 105 72  Resp:  20  Temp: 97.8 F (36.6 C) 98.2 F (36.8 C)  SpO2: 100% 100%   Vitals:   04/29/21 0242 04/29/21 0400 04/29/21 0623 04/29/21 0801  BP: (!) 167/107 (!) 183/111 (!) 157/104 (!) 168/79  Pulse: (!) 105 (!) 108 (!) 105 72  Resp: (!) '21 17  20  ' Temp:  98.1 F (36.7 C) 97.8 F (36.6 C) 98.2 F (36.8 C)  TempSrc:  Oral Oral   SpO2: 100% 100% 100% 100%  Weight:  113.4 kg    Height:  '5\' 11"'  (1.803 m)      General: Not in pain or dyspnea. Neurology: Awake and alert, non focal  E ENT: no pallor, no icterus, oral mucosa moist Cardiovascular: No JVD. S1-S2 present, rhythmic, no gallops, rubs, or murmurs. Trace lower extremity edema. Pulmonary:  positive breath sounds bilaterally, adequate air movement, no wheezing, rhonchi or rales. Gastrointestinal. Abdomen soft and non tender Skin. No rashes Musculoskeletal: no joint deformities   The results of significant diagnostics from this hospitalization (including imaging, microbiology, ancillary and laboratory) are listed below for reference.     Microbiology: Recent Results (from the past 240 hour(s))  Resp Panel by RT-PCR (Flu A&B, Covid) Nasopharyngeal Swab     Status: None   Collection Time: 04/28/21 11:19 PM   Specimen: Nasopharyngeal Swab; Nasopharyngeal(NP) swabs in vial transport medium  Result Value Ref Range Status   SARS Coronavirus 2 by RT PCR NEGATIVE NEGATIVE Final    Comment: (NOTE) SARS-CoV-2 target nucleic acids are NOT DETECTED.  The SARS-CoV-2 RNA is generally detectable in upper respiratory specimens during the acute phase of infection. The lowest concentration of SARS-CoV-2 viral copies this assay can detect is 138 copies/mL. A negative result does not preclude SARS-Cov-2 infection and  should not be used as the sole basis for treatment or other patient management decisions. A negative result may occur with  improper specimen collection/handling, submission of specimen other than nasopharyngeal swab, presence of viral mutation(s) within the areas targeted by this assay, and inadequate number of viral copies(<138 copies/mL). A negative result must be combined with clinical observations, patient history, and epidemiological information. The expected result is Negative.  Fact Sheet for Patients:  EntrepreneurPulse.com.au  Fact Sheet for Healthcare Providers:  IncredibleEmployment.be  This test is no t yet approved or cleared by the [place] FDA and  has been authorized for detection and/or diagnosis of SARS-CoV-2 by FDA under an Emergency Use Authorization (EUA). This EUA will remain  in effect (meaning this test can be used) for the duration of the COVID-19 declaration under Section 564(b)(1) of the Act, 21 U.S.C.section 360bbb-3(b)(1), unless the authorization is terminated  or revoked sooner.       Influenza A by PCR NEGATIVE NEGATIVE Final   Influenza B by PCR NEGATIVE NEGATIVE Final    Comment: (NOTE) The Xpert Xpress SARS-CoV-2/FLU/RSV plus assay is intended as an aid in the diagnosis of influenza from Nasopharyngeal swab specimens and should not be used as a sole basis for treatment. Nasal washings and aspirates are unacceptable for Xpert Xpress SARS-CoV-2/FLU/RSV testing.  Fact Sheet for Patients: EntrepreneurPulse.com.au  Fact Sheet for Healthcare Providers: IncredibleEmployment.be  This test is not yet approved or cleared by the [place] FDA and has been authorized for detection and/or diagnosis of SARS-CoV-2 by FDA under an Emergency Use Authorization (EUA). This EUA will remain in effect (meaning this test can be used) for the duration of the COVID-19 declaration  under Section 564(b)(1) of the Act, 21 U.S.C. section 360bbb-3(b)(1), unless the authorization is terminated or revoked.  Performed at [facility], [address] [place], [place] [address]      Labs: BNP (last 3 results) Recent Labs    09/23/20 1010 10/24/20 1600 04/28/21 1816  BNP 638.0* 580.3* 4,034.7*   Basic Metabolic Panel: Recent Labs  Lab 04/28/21 1816 04/29/21 0442  NA 135 138  K 4.0 3.6  CL 101 100  CO2 25 28  GLUCOSE 101* 88  BUN 22 16  CREATININE 6.61* 4.81*  CALCIUM 8.9 8.8*   Liver Function Tests: No results for input(s): AST, ALT, ALKPHOS, BILITOT, PROT, ALBUMIN in the last 168 hours. No results for input(s): LIPASE, AMYLASE in the last 168 hours. No results for input(s): AMMONIA in the last 168 hours. CBC: Recent Labs  Lab 04/28/21 1816 04/29/21 0442  WBC 5.8 5.6  HGB 8.2* 8.9*  HCT 25.6* 26.7*  MCV 87.4 89.0  PLT 270 259   Cardiac Enzymes: No results for input(s): CKTOTAL, CKMB, CKMBINDEX, TROPONINI in the last 168 hours. BNP: Invalid input(s): POCBNP CBG: Recent Labs  Lab 04/29/21 0756 04/29/21 1135  GLUCAP 79 105*  D-Dimer No results for input(s): DDIMER in the last 72 hours. Hgb A1c Recent Labs    04/29/21 0442  HGBA1C 5.6   Lipid Profile No results for input(s): CHOL, HDL, LDLCALC, TRIG, CHOLHDL, LDLDIRECT in the last 72 hours. Thyroid function studies No results for input(s): TSH, T4TOTAL, T3FREE, THYROIDAB in the last 72 hours.  Invalid input(s): FREET3 Anemia work up No results for input(s): VITAMINB12, FOLATE, FERRITIN, TIBC, IRON, RETICCTPCT in the last 72 hours. Urinalysis    Component Value Date/Time   COLORURINE AMBER (A) 08/25/2020 1335   APPEARANCEUR HAZY (A) 08/25/2020 1335   APPEARANCEUR Cloudy (A) 07/30/2019 1301   LABSPEC 1.009 08/25/2020 1335   PHURINE 8.0 08/25/2020 1335   GLUCOSEU 50 (A) 08/25/2020 1335   HGBUR LARGE (A) 08/25/2020 1335   BILIRUBINUR NEGATIVE 08/25/2020 1335    BILIRUBINUR Negative 07/30/2019 1301   KETONESUR NEGATIVE 08/25/2020 1335   PROTEINUR >=300 (A) 08/25/2020 1335   NITRITE NEGATIVE 08/25/2020 1335   LEUKOCYTESUR TRACE (A) 08/25/2020 1335   Sepsis Labs Invalid input(s): PROCALCITONIN,  WBC,  LACTICIDVEN Microbiology Recent Results (from the past 240 hour(s))  Resp Panel by RT-PCR (Flu A&B, Covid) Nasopharyngeal Swab     Status: None   Collection Time: 04/28/21 11:19 PM   Specimen: Nasopharyngeal Swab; Nasopharyngeal(NP) swabs in vial transport medium  Result Value Ref Range Status   SARS Coronavirus 2 by RT PCR NEGATIVE NEGATIVE Final    Comment: (NOTE) SARS-CoV-2 target nucleic acids are NOT DETECTED.  The SARS-CoV-2 RNA is generally detectable in upper respiratory specimens during the acute phase of infection. The lowest concentration of SARS-CoV-2 viral copies this assay can detect is 138 copies/mL. A negative result does not preclude SARS-Cov-2 infection and should not be used as the sole basis for treatment or other patient management decisions. A negative result may occur with  improper specimen collection/handling, submission of specimen other than nasopharyngeal swab, presence of viral mutation(s) within the areas targeted by this assay, and inadequate number of viral copies(<138 copies/mL). A negative result must be combined with clinical observations, patient history, and epidemiological information. The expected result is Negative.  Fact Sheet for Patients:  EntrepreneurPulse.com.au  Fact Sheet for Healthcare Providers:  IncredibleEmployment.be  This test is no t yet approved or cleared by the [place] FDA and  has been authorized for detection and/or diagnosis of SARS-CoV-2 by FDA under an Emergency Use Authorization (EUA). This EUA will remain  in effect (meaning this test can be used) for the duration of the COVID-19 declaration under Section 564(b)(1) of the Act,  21 U.S.C.section 360bbb-3(b)(1), unless the authorization is terminated  or revoked sooner.       Influenza A by PCR NEGATIVE NEGATIVE Final   Influenza B by PCR NEGATIVE NEGATIVE Final    Comment: (NOTE) The Xpert Xpress SARS-CoV-2/FLU/RSV plus assay is intended as an aid in the diagnosis of influenza from Nasopharyngeal swab specimens and should not be used as a sole basis for treatment. Nasal washings and aspirates are unacceptable for Xpert Xpress SARS-CoV-2/FLU/RSV testing.  Fact Sheet for Patients: EntrepreneurPulse.com.au  Fact Sheet for Healthcare Providers: IncredibleEmployment.be  This test is not yet approved or cleared by the [place] FDA and has been authorized for detection and/or diagnosis of SARS-CoV-2 by FDA under an Emergency Use Authorization (EUA). This EUA will remain in effect (meaning this test can be used) for the duration of the COVID-19 declaration under Section 564(b)(1) of the Act, 21 U.S.C. section 360bbb-3(b)(1), unless the authorization is terminated  or revoked.  Performed at [facility], [address] [place], [place] [address]      Time coordinating discharge: 45 minutes  SIGNED:   Tawni Millers, MD  [facility] 04/29/2021, 1:13 PM

## 2021-04-29 NOTE — Care Management CC44 (Signed)
Condition Code 44 Documentation Completed  Patient Details  Name: Steve Vance MRN: [id number] Date of Birth: [birth year]   Condition Code 44 given:  Yes Patient signature on Condition Code 44 notice:  Yes Documentation of 2 MD's agreement:  Yes Code 44 added to claim:  Yes    Alberteen Sam, LCSW 04/29/2021, 1:56 PM

## 2021-04-29 NOTE — Care Management Obs Status (Signed)
[facility] NOTIFICATION   Patient Details  Name: Steve Vance MRN: [id number] Date of Birth: 1960-12-30   Medicare Observation Status Notification Given:  Yes    Alberteen Sam, LCSW 04/29/2021, 1:56 PM

## 2021-04-29 NOTE — ED Notes (Signed)
Dialysis complete at this time. 3L pulled off per April, RN at bedside.

## 2021-04-29 NOTE — Progress Notes (Signed)
Patient given instructions to make follow up appointment, when to return for worsening symptoms, IV taken out, clarified medication education, patient stated he has a lot going on in his mind, & discharged via wheelchair. Medications to take tonight were marked for the patient.

## 2021-04-29 NOTE — Progress Notes (Signed)
[facility]  ROUNDING NOTE   Subjective:   [name] was admitted to [facility] on 04/28/2021 for Volume overload [E87.70] Other hypervolemia [E87.79]  Last hemodialysis treatment was earlier today. He was taken off treatment 30 minutes early and was 1kg above his dry weight on leaving.   Patient presents with increasing shortness of breath, pulmonary edema and requiring oxygen. Patient with hypertensive urgency.   Patient seen sitting at side of bed  Completing breakfast Denies shortness of breath since dialysis last night Feels comfortable with discharge today   Objective:  Vital signs in last 24 hours:  Temp:  [97.8 F (36.6 C)-98.4 F (36.9 C)] 98.2 F (36.8 C) (08/24 0801) Pulse Rate:  [72-120] 72 (08/24 0801) Resp:  [17-33] 20 (08/24 0801) BP: (157-194)/(79-127) 168/79 (08/24 0801) SpO2:  [100 %] 100 % (08/24 0801) Weight:  [106.1 kg-113.4 kg] 113.4 kg (08/24 0400)  Weight change:  Filed Weights   04/28/21 1811 04/29/21 0400  Weight: 106.1 kg 113.4 kg    Intake/Output: I/O last 3 completed shifts: In: -  Out: 3000 [Other:3000]   Intake/Output this shift:  Total I/O In: -  Out: 200 [Urine:200]  Physical Exam: General: Ill appearing, in respiratory distress  Head: Normocephalic, atraumatic. Moist oral mucosal membranes  Eyes: Anicteric  Lungs:  Clear bilaterally, [place] O2   Heart: Regular rate and rhythm  Abdomen:  Soft, nontender,   Extremities:  Trace peripheral edema.  Neurologic: Nonfocal, moving all four extremities  Skin: No lesions, dry  Access: Left AVF +thrill +bruit    Basic Metabolic Panel: Recent Labs  Lab 04/28/21 1816 04/29/21 0442  NA 135 138  K 4.0 3.6  CL 101 100  CO2 25 28  GLUCOSE 101* 88  BUN 22 16  CREATININE 6.61* 4.81*  CALCIUM 8.9 8.8*     Liver Function Tests: No results for input(s): AST, ALT, ALKPHOS, BILITOT, PROT, ALBUMIN in the last 168 hours. No results for input(s): LIPASE, AMYLASE in the last  168 hours. No results for input(s): AMMONIA in the last 168 hours.  CBC: Recent Labs  Lab 04/28/21 1816 04/29/21 0442  WBC 5.8 5.6  HGB 8.2* 8.9*  HCT 25.6* 26.7*  MCV 87.4 89.0  PLT 270 259     Cardiac Enzymes: No results for input(s): CKTOTAL, CKMB, CKMBINDEX, TROPONINI in the last 168 hours.  BNP: Invalid input(s): POCBNP  CBG: Recent Labs  Lab 04/29/21 0756 04/29/21 1135  GLUCAP 79 105*     Microbiology: Results for orders placed or performed during the hospital encounter of 04/28/21  Resp Panel by RT-PCR (Flu A&B, Covid) Nasopharyngeal Swab     Status: None   Collection Time: 04/28/21 11:19 PM   Specimen: Nasopharyngeal Swab; Nasopharyngeal(NP) swabs in vial transport medium  Result Value Ref Range Status   SARS Coronavirus 2 by RT PCR NEGATIVE NEGATIVE Final    Comment: (NOTE) SARS-CoV-2 target nucleic acids are NOT DETECTED.  The SARS-CoV-2 RNA is generally detectable in upper respiratory specimens during the acute phase of infection. The lowest concentration of SARS-CoV-2 viral copies this assay can detect is 138 copies/mL. A negative result does not preclude SARS-Cov-2 infection and should not be used as the sole basis for treatment or other patient management decisions. A negative result may occur with  improper specimen collection/handling, submission of specimen other than nasopharyngeal swab, presence of viral mutation(s) within the areas targeted by this assay, and inadequate number of viral copies(<138 copies/mL). A negative result must be combined  with clinical observations, patient history, and epidemiological information. The expected result is Negative.  Fact Sheet for Patients:  EntrepreneurPulse.com.au  Fact Sheet for Healthcare Providers:  IncredibleEmployment.be  This test is no t yet approved or cleared by the [place] FDA and  has been authorized for detection and/or diagnosis of SARS-CoV-2  by FDA under an Emergency Use Authorization (EUA). This EUA will remain  in effect (meaning this test can be used) for the duration of the COVID-19 declaration under Section 564(b)(1) of the Act, 21 U.S.C.section 360bbb-3(b)(1), unless the authorization is terminated  or revoked sooner.       Influenza A by PCR NEGATIVE NEGATIVE Final   Influenza B by PCR NEGATIVE NEGATIVE Final    Comment: (NOTE) The Xpert Xpress SARS-CoV-2/FLU/RSV plus assay is intended as an aid in the diagnosis of influenza from Nasopharyngeal swab specimens and should not be used as a sole basis for treatment. Nasal washings and aspirates are unacceptable for Xpert Xpress SARS-CoV-2/FLU/RSV testing.  Fact Sheet for Patients: EntrepreneurPulse.com.au  Fact Sheet for Healthcare Providers: IncredibleEmployment.be  This test is not yet approved or cleared by the [place] FDA and has been authorized for detection and/or diagnosis of SARS-CoV-2 by FDA under an Emergency Use Authorization (EUA). This EUA will remain in effect (meaning this test can be used) for the duration of the COVID-19 declaration under Section 564(b)(1) of the Act, 21 U.S.C. section 360bbb-3(b)(1), unless the authorization is terminated or revoked.  Performed at [facility], [address] [place], [place] [address]     Coagulation Studies: No results for input(s): LABPROT, INR in the last 72 hours.  Urinalysis: No results for input(s): COLORURINE, LABSPEC, PHURINE, GLUCOSEU, HGBUR, BILIRUBINUR, KETONESUR, PROTEINUR, UROBILINOGEN, NITRITE, LEUKOCYTESUR in the last 72 hours.  Invalid input(s): APPERANCEUR    Imaging: DG Chest 2 View  Result Date: 04/28/2021 CLINICAL DATA:  Shortness of breath, dialysis patient, tachypnea, diabetes mellitus, hypertension EXAM: CHEST - 2 VIEW COMPARISON:  10/24/2020 FINDINGS: Enlargement of cardiac silhouette with pulmonary vascular congestion.  Mediastinal contours normal. Scattered interstitial infiltrates likely representing pulmonary edema, less severe than on previous exam. Tiny RIGHT pleural effusion. No segmental consolidation or pneumothorax. Osseous demineralization with prior LEFT shoulder surgery and RIGHT shoulder glenohumeral degenerative changes. IMPRESSION: Enlargement of cardiac silhouette with pulmonary vascular congestion and probable interstitial edema. Electronically Signed   By: Lavonia Dana M.D.   On: 04/28/2021 18:56     Medications:    sodium chloride     sodium chloride      amLODipine  10 mg Oral Daily   aspirin EC  81 mg Oral Daily   atorvastatin  10 mg Oral Daily   famotidine  20 mg Oral Daily   heparin  5,000 Units Subcutaneous Q8H   hydrALAZINE  25 mg Oral Q8H   insulin aspart  0-6 Units Subcutaneous TID WC   lisinopril  40 mg Oral Daily   metoprolol tartrate  50 mg Oral BID   sodium chloride flush  3 mL Intravenous Q12H     Assessment/ Plan:  [name] is a 60 y.o. black male with end stage renal disease on hemodialysis, hypertension, congestive heart failure, diabetes mellitus type II, diabetic neuropathy, hyperlipdiemia, who presents to [facility] ED on 04/28/2021 for Volume overload [E87.70] Other hypervolemia [E87.79]  [facility] TTS [place] Left AVF 107.5kg  End Stage Renal Disease: with hypertensive urgency, pulmonary edema and volume overload. Patient requiring supplemental oxygen.  - Emergent hemodialysis treatment last night,  UF goal 3L achieved. Next treatment scheduled for tomorrow, if remains inpatient.   Hypertension: with urgency. 168/79 He states he is taking his medications. Blood pressures appear elevated at dialysis treatments as well. Home regimen of amlodipine, hydralazine, metoprolol and lisinopril.   Anemia of chronic kidney disease: hemoglobin 8.9. Due to hypertension, will hold ESA  Secondary Hyperparathyroidism: calcium acetate 3 tabs with meals.    LOS:  0   8/24/20221:16 PM

## 2021-04-29 NOTE — ED Notes (Signed)
Dialysis still in process with RN, April at bedside. Pt is resting comfortably.

## 2021-07-28 ENCOUNTER — Ambulatory Visit: Payer: Medicare Other | Admitting: Gastroenterology

## 2021-08-14 ENCOUNTER — Other Ambulatory Visit: Payer: Self-pay

## 2021-08-14 ENCOUNTER — Emergency Department: Payer: Medicare Other

## 2021-08-14 ENCOUNTER — Emergency Department
Admission: EM | Admit: 2021-08-14 | Discharge: 2021-08-14 | Disposition: A | Discharge status: Home / Self Care | Payer: Medicare Other | Attending: Emergency Medicine | Admitting: Emergency Medicine

## 2021-08-14 DIAGNOSIS — L299 Pruritus, unspecified: Secondary | ICD-10-CM

## 2021-08-14 DIAGNOSIS — R0602 Shortness of breath: Secondary | ICD-10-CM | POA: Diagnosis present

## 2021-08-14 DIAGNOSIS — Z79899 Other long term (current) drug therapy: Secondary | ICD-10-CM | POA: Diagnosis not present

## 2021-08-14 DIAGNOSIS — E1122 Type 2 diabetes mellitus with diabetic chronic kidney disease: Secondary | ICD-10-CM | POA: Insufficient documentation

## 2021-08-14 DIAGNOSIS — Z8616 Personal history of COVID-19: Secondary | ICD-10-CM | POA: Insufficient documentation

## 2021-08-14 DIAGNOSIS — Z7982 Long term (current) use of aspirin: Secondary | ICD-10-CM | POA: Diagnosis not present

## 2021-08-14 DIAGNOSIS — I509 Heart failure, unspecified: Secondary | ICD-10-CM | POA: Diagnosis not present

## 2021-08-14 DIAGNOSIS — I132 Hypertensive heart and chronic kidney disease with heart failure and with stage 5 chronic kidney disease, or end stage renal disease: Secondary | ICD-10-CM | POA: Insufficient documentation

## 2021-08-14 DIAGNOSIS — N186 End stage renal disease: Secondary | ICD-10-CM | POA: Diagnosis not present

## 2021-08-14 DIAGNOSIS — Z992 Dependence on renal dialysis: Secondary | ICD-10-CM | POA: Insufficient documentation

## 2021-08-14 LAB — PHOSPHORUS: Phosphorus: 6.2 mg/dL — ABNORMAL HIGH (ref 2.5–4.6)

## 2021-08-14 LAB — COMPREHENSIVE METABOLIC PANEL
ALT: 20 U/L (ref 0–44)
AST: 23 U/L (ref 15–41)
Albumin: 3.6 g/dL (ref 3.5–5.0)
Alkaline Phosphatase: 61 U/L (ref 38–126)
Anion gap: 9 (ref 5–15)
BUN: 34 mg/dL — ABNORMAL HIGH (ref 8–23)
CO2: 26 mmol/L (ref 22–32)
Calcium: 8.5 mg/dL — ABNORMAL LOW (ref 8.9–10.3)
Chloride: 101 mmol/L (ref 98–111)
Creatinine, Ser: 8.29 mg/dL — ABNORMAL HIGH (ref 0.61–1.24)
GFR, Estimated: 7 mL/min — ABNORMAL LOW (ref >60)
Glucose, Bld: 129 mg/dL — ABNORMAL HIGH (ref 70–99)
Potassium: 4.2 mmol/L (ref 3.5–5.1)
Sodium: 136 mmol/L (ref 135–145)
Total Bilirubin: 0.8 mg/dL (ref 0.3–1.2)
Total Protein: 8.1 g/dL (ref 6.5–8.1)

## 2021-08-14 LAB — CBC
HCT: 34 % — ABNORMAL LOW (ref 39.0–52.0)
Hemoglobin: 10.5 g/dL — ABNORMAL LOW (ref 13.0–17.0)
MCH: 28.7 pg (ref 26.0–34.0)
MCHC: 30.9 g/dL (ref 30.0–36.0)
MCV: 92.9 fL (ref 80.0–100.0)
Platelets: 218 10*3/uL (ref 150–400)
RBC: 3.66 MIL/uL — ABNORMAL LOW (ref 4.22–5.81)
RDW: 14.3 % (ref 11.5–15.5)
WBC: 4.6 10*3/uL (ref 4.0–10.5)
nRBC: 0 % (ref 0.0–0.2)

## 2021-08-14 LAB — TROPONIN I (HIGH SENSITIVITY)
Troponin I (High Sensitivity): 34 ng/L — ABNORMAL HIGH (ref <18)
Troponin I (High Sensitivity): 33 ng/L — ABNORMAL HIGH (ref <18)

## 2021-08-14 LAB — BRAIN NATRIURETIC PEPTIDE: B Natriuretic Peptide: 917.1 pg/mL — ABNORMAL HIGH (ref 0.0–100.0)

## 2021-08-14 IMAGING — CR DG CHEST 2V
2 series · 2 of 2 positions shown · non-contrast
Comparison: [DATE]

CLINICAL DATA: SOB with chest pain that started this morning. Pain
anterior sternal area, described as intermittent and sharp in
nature. Pt SAKEO no lung conditions. Hx CHF. Nonsmoker. Chest pain

EXAM:
CHEST - 2 VIEW

[chest pa]
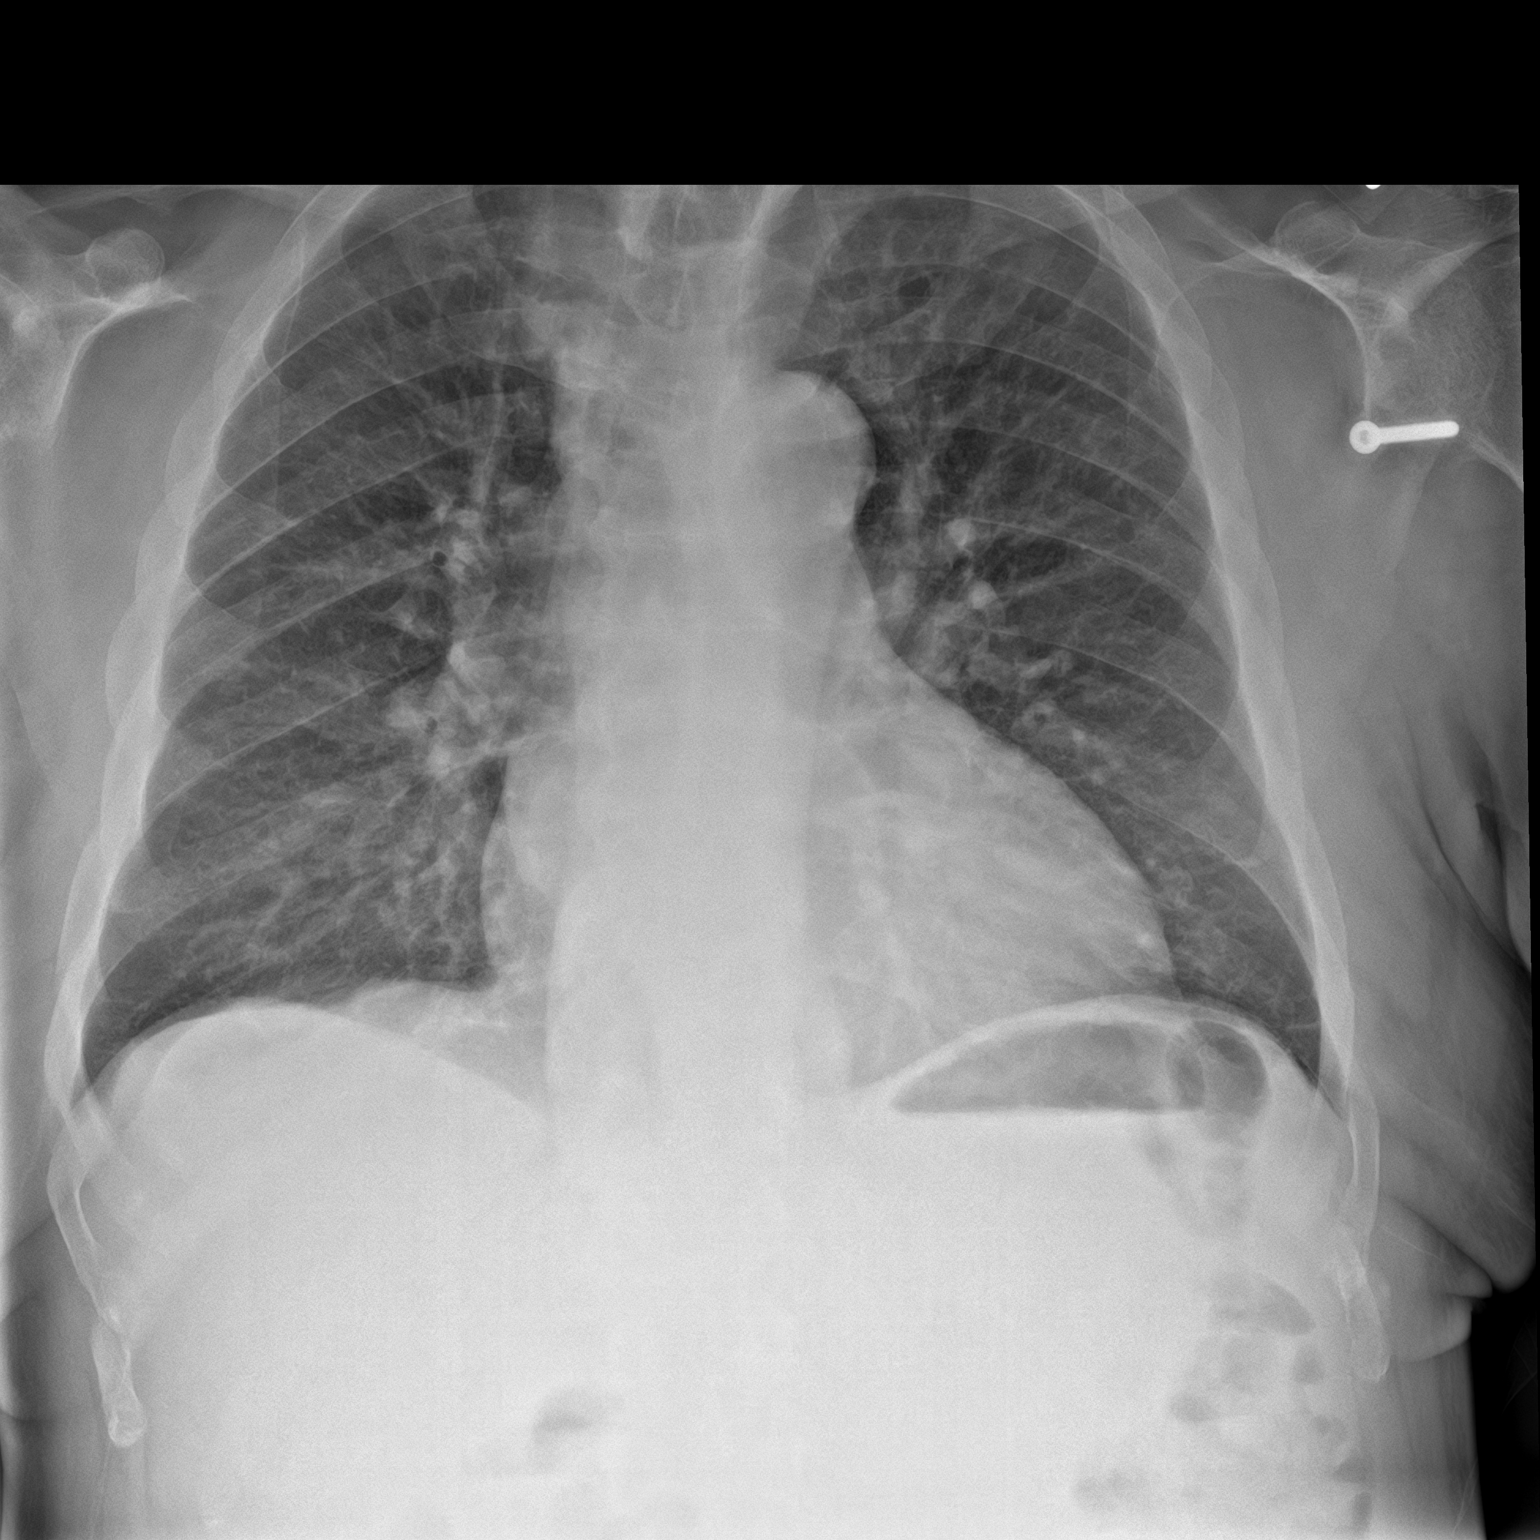

[chest lat]
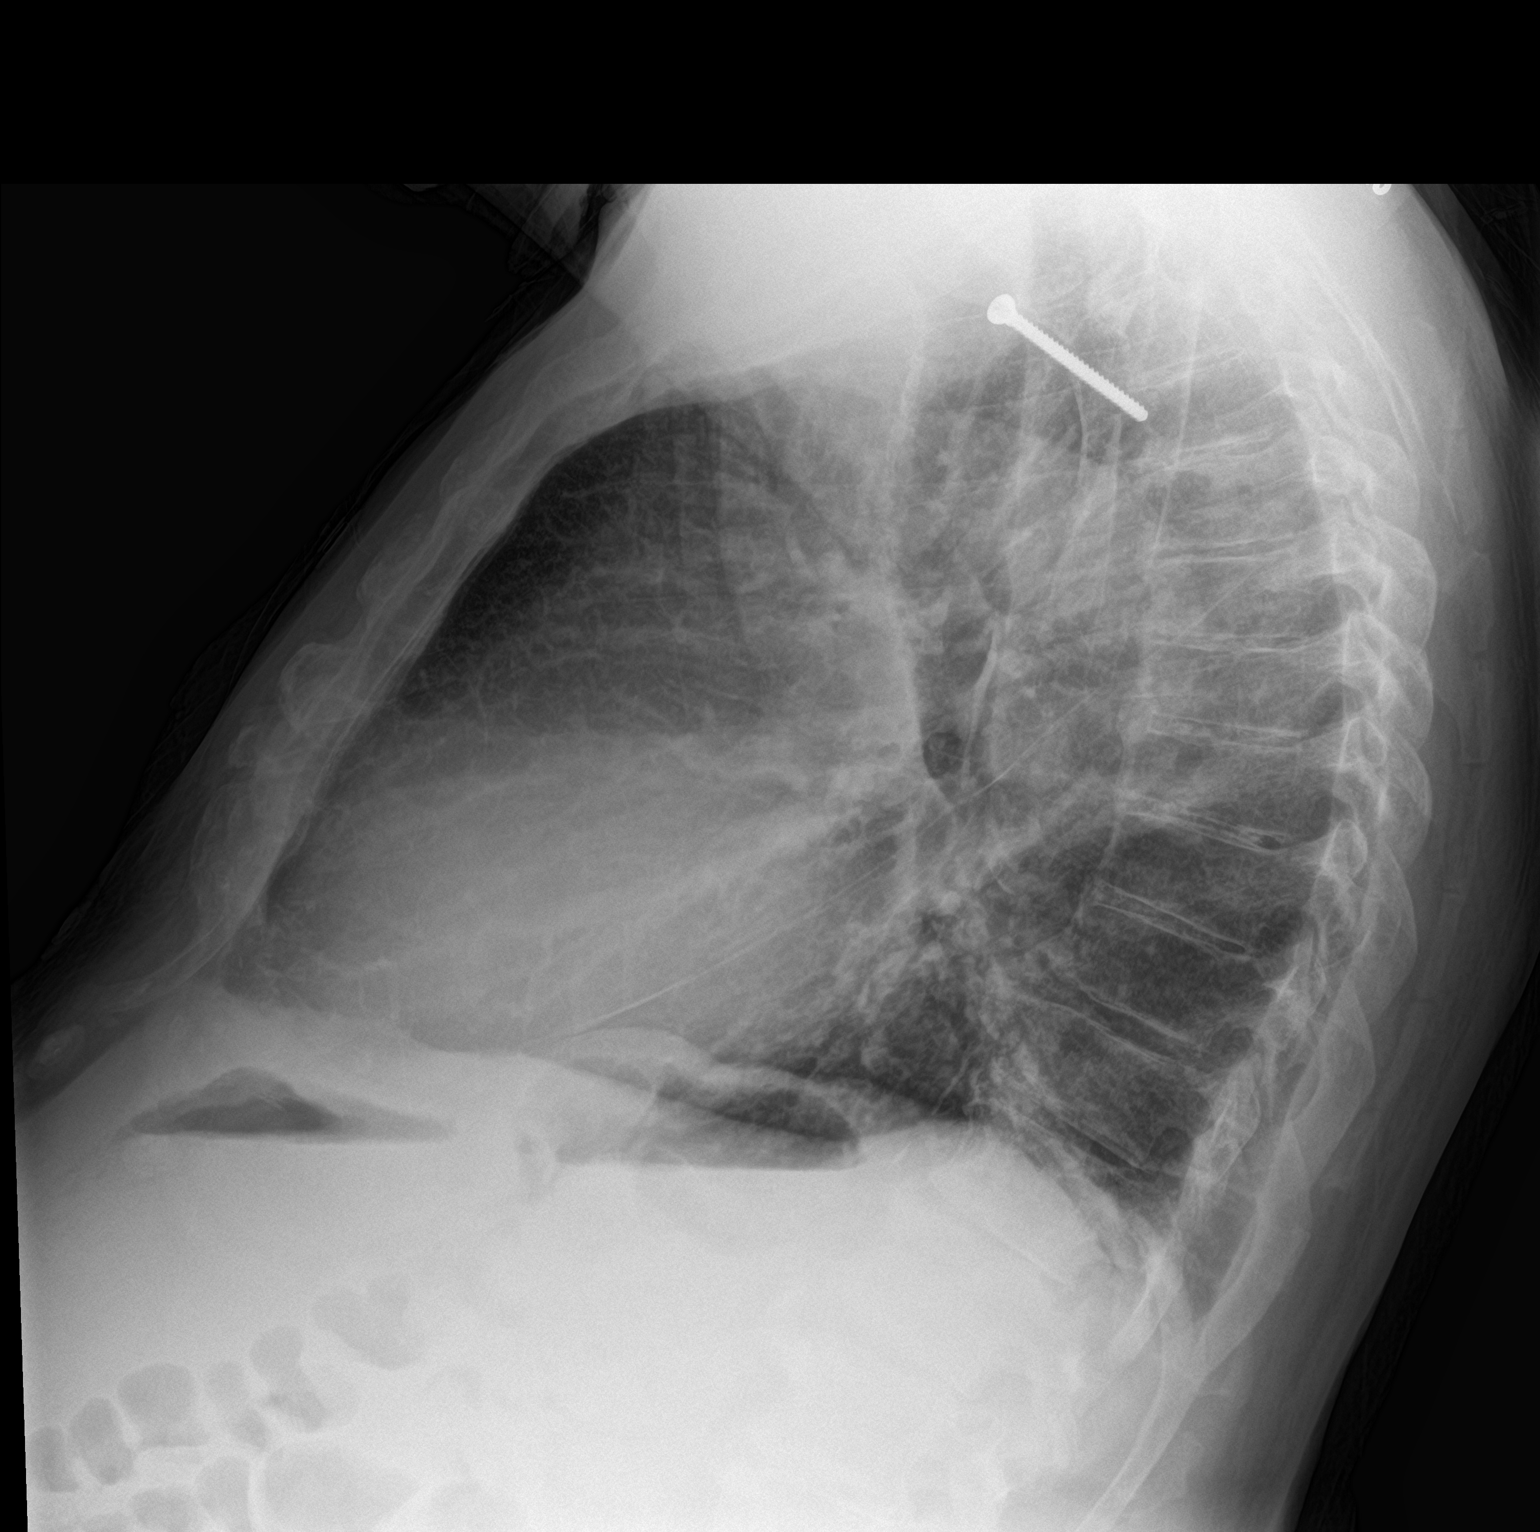

[2 of 2 positions shown; findings below may reference images not displayed]

FINDINGS: Lungs are clear.  Vascular congestion seen previously has improved.

Heart size and mediastinal contours are within normal limits. Aortic
Atherosclerosis ([1W]-170.0).

No effusion.  No pneumothorax.

Stable orthopedic screw in the left scapula.
IMPRESSION: No acute cardiopulmonary disease.

## 2021-08-14 MED ORDER — CALCIUM ACETATE (PHOS BINDER) 667 MG PO CAPS
667.0000 mg | ORAL_CAPSULE | Freq: Once | ORAL | Status: AC
  Administered 2021-08-14: 667 mg via ORAL
  Filled 2021-08-14: qty 1

## 2021-08-14 NOTE — ED Notes (Signed)
Dr. Cheri Fowler at bedside

## 2021-08-14 NOTE — ED Notes (Signed)
Patient keeps sliding self to end of bed. Yelling out can't get breath. EDP aware Sat 100%, Tried 1 L patient took off. States I am itching. EDP aware med order. Message sent to Pharm to get.

## 2021-08-14 NOTE — ED Notes (Signed)
PAtient requesting fan. Steve Vance given to patient

## 2021-08-14 NOTE — ED Notes (Signed)
Patient yelling out reporting he cannot breathe. Ripping off monitor leads and climbing to the edge of bed tripoding. Patient also placed on 1L O2 for comfort due to patient requesting multiple times. MD Cheri Fowler made aware.

## 2021-08-14 NOTE — ED Triage Notes (Signed)
Pt c/o SOB with chest pain that started this morning, pt also c/o itchy rash all over, pt is on dialysis, T,TH,Sat, last treatment yesterday. Pt is in NAD on arrival
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
## 2021-08-14 NOTE — ED Notes (Signed)
Return from [place]. States can't get breath. Position up in bed for comfort. 100% RA.

## 2021-08-24 ENCOUNTER — Other Ambulatory Visit: Payer: Self-pay

## 2021-08-24 ENCOUNTER — Emergency Department: Payer: Medicare Other

## 2021-08-24 DIAGNOSIS — Z992 Dependence on renal dialysis: Secondary | ICD-10-CM | POA: Diagnosis not present

## 2021-08-24 DIAGNOSIS — R0602 Shortness of breath: Secondary | ICD-10-CM | POA: Diagnosis not present

## 2021-08-24 DIAGNOSIS — R059 Cough, unspecified: Secondary | ICD-10-CM | POA: Insufficient documentation

## 2021-08-24 DIAGNOSIS — Z5321 Procedure and treatment not carried out due to patient leaving prior to being seen by health care provider: Secondary | ICD-10-CM | POA: Insufficient documentation

## 2021-08-24 LAB — CBC WITH DIFFERENTIAL/PLATELET
Abs Immature Granulocytes: 0.02 10*3/uL (ref 0.00–0.07)
Basophils Absolute: 0 10*3/uL (ref 0.0–0.1)
Basophils Relative: 0 %
Eosinophils Absolute: 0.5 10*3/uL (ref 0.0–0.5)
Eosinophils Relative: 7 %
HCT: 31.9 % — ABNORMAL LOW (ref 39.0–52.0)
Hemoglobin: 9.9 g/dL — ABNORMAL LOW (ref 13.0–17.0)
Immature Granulocytes: 0 %
Lymphocytes Relative: 14 %
Lymphs Abs: 1 10*3/uL (ref 0.7–4.0)
MCH: 28.4 pg (ref 26.0–34.0)
MCHC: 31 g/dL (ref 30.0–36.0)
MCV: 91.7 fL (ref 80.0–100.0)
Monocytes Absolute: 0.5 10*3/uL (ref 0.1–1.0)
Monocytes Relative: 8 %
Neutro Abs: 4.9 10*3/uL (ref 1.7–7.7)
Neutrophils Relative %: 71 %
Platelets: 224 10*3/uL (ref 150–400)
RBC: 3.48 MIL/uL — ABNORMAL LOW (ref 4.22–5.81)
RDW: 15 % (ref 11.5–15.5)
WBC: 7 10*3/uL (ref 4.0–10.5)
nRBC: 0 % (ref 0.0–0.2)

## 2021-08-24 LAB — BASIC METABOLIC PANEL
Anion gap: 12 (ref 5–15)
BUN: 45 mg/dL — ABNORMAL HIGH (ref 8–23)
CO2: 23 mmol/L (ref 22–32)
Calcium: 8.4 mg/dL — ABNORMAL LOW (ref 8.9–10.3)
Chloride: 102 mmol/L (ref 98–111)
Creatinine, Ser: 9.39 mg/dL — ABNORMAL HIGH (ref 0.61–1.24)
GFR, Estimated: 6 mL/min — ABNORMAL LOW (ref >60)
Glucose, Bld: 98 mg/dL (ref 70–99)
Potassium: 6.2 mmol/L — ABNORMAL HIGH (ref 3.5–5.1)
Sodium: 137 mmol/L (ref 135–145)

## 2021-08-24 IMAGING — CR DG CHEST 2V
2 series · 2 of 2 positions shown · non-contrast
Comparison: [DATE]

CLINICAL DATA: Dyspnea

EXAM:
CHEST - 2 VIEW

[chest lat]
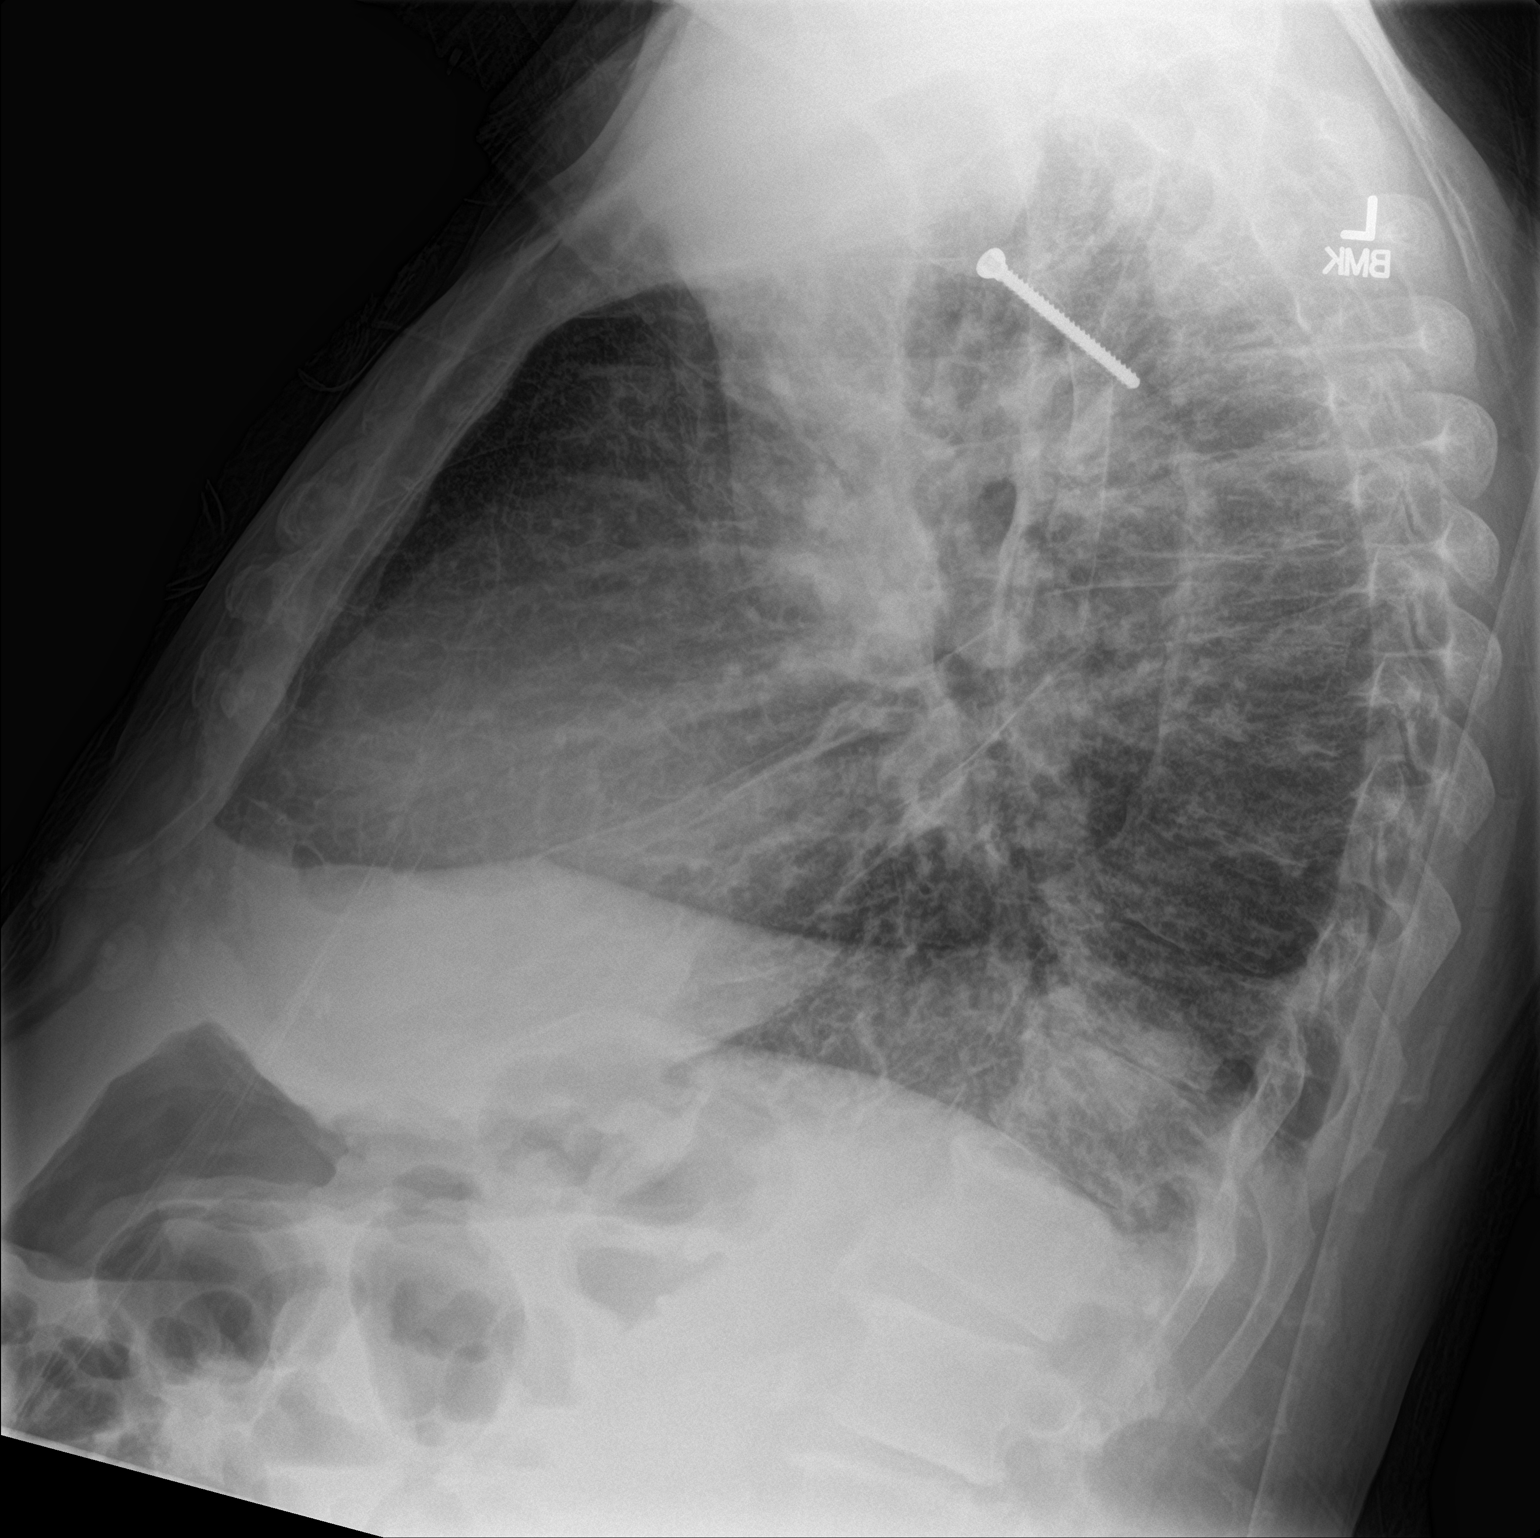

[chest ap]
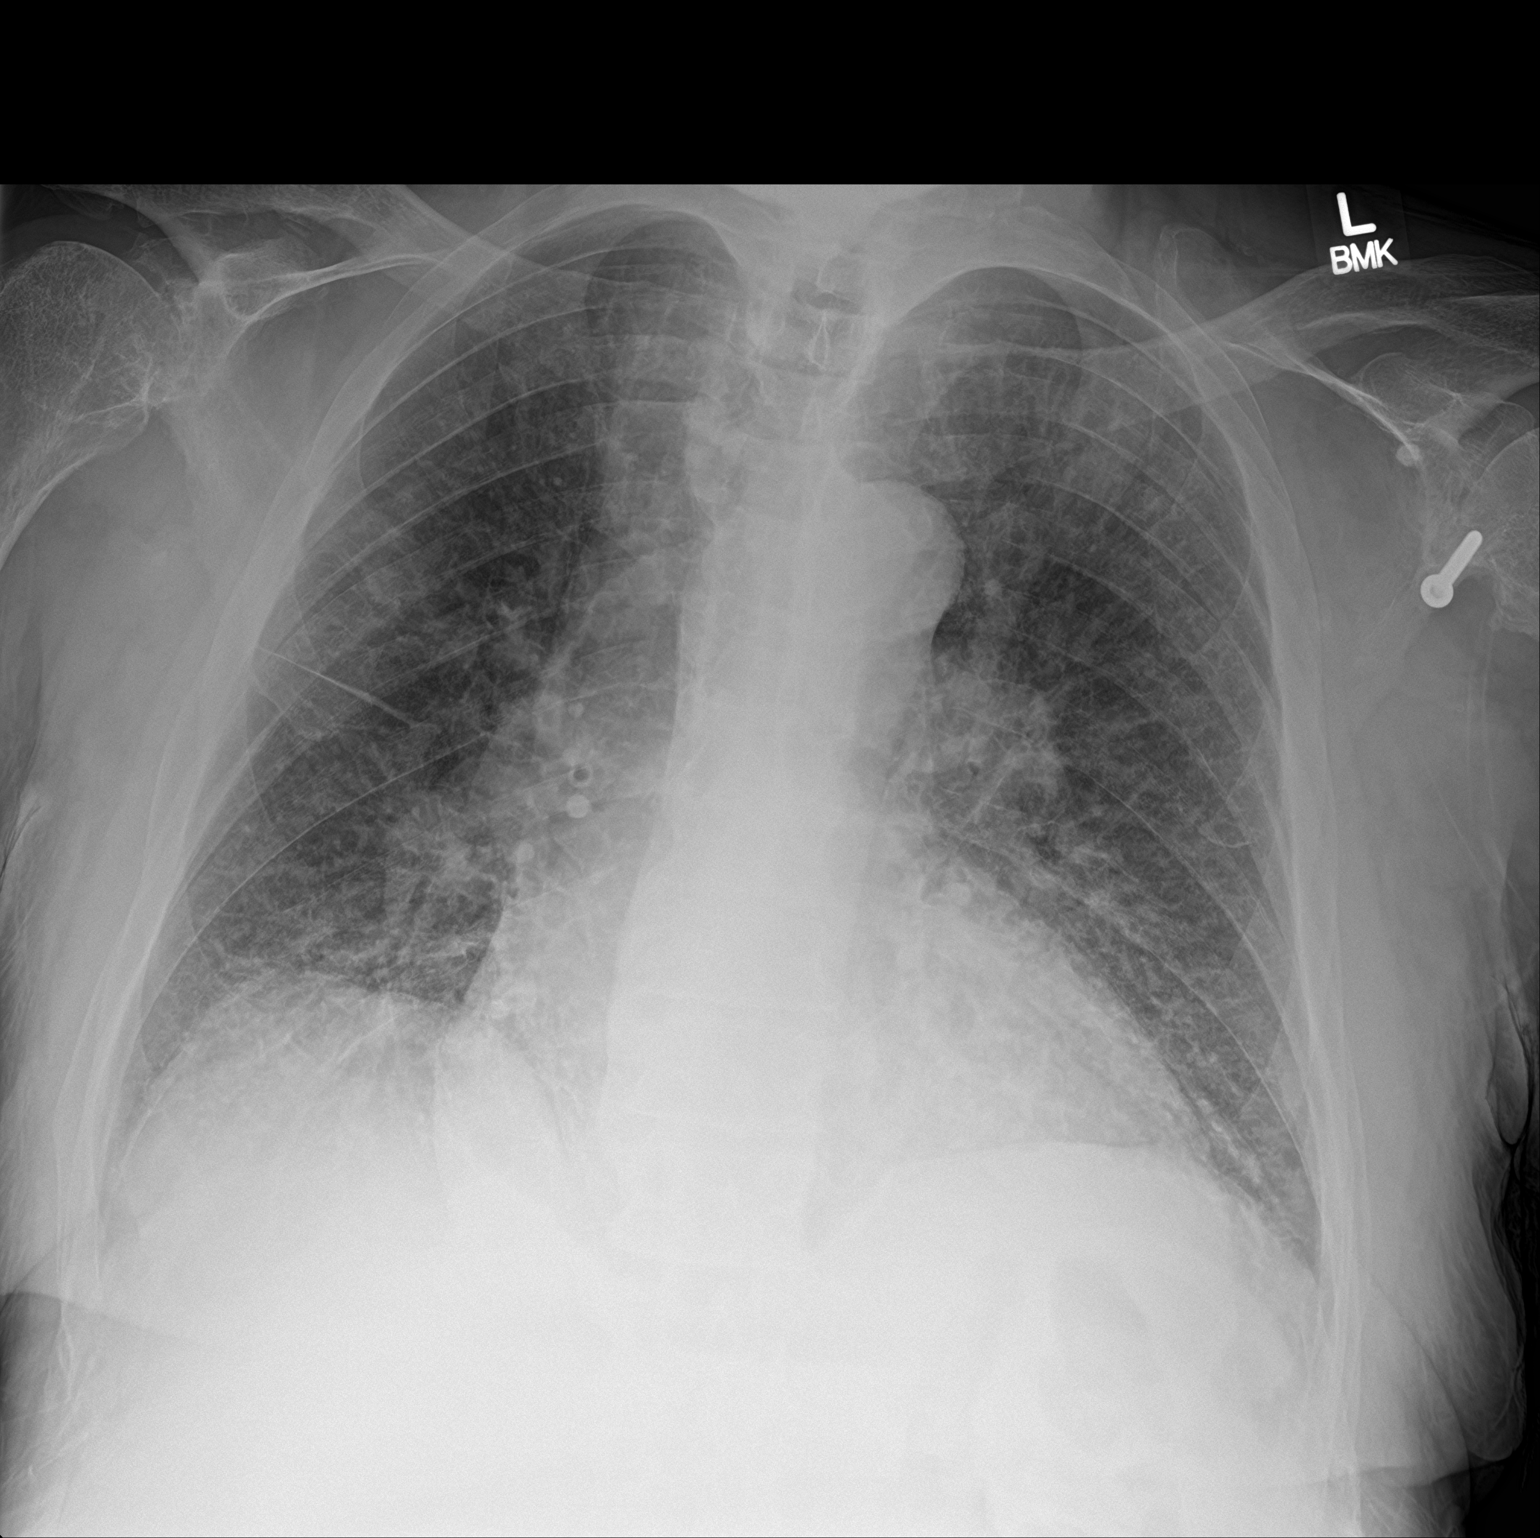

[2 of 2 positions shown; findings below may reference images not displayed]

FINDINGS: The lungs are symmetrically expanded. There is diffuse interstitial
pulmonary infiltrate in keeping with changes of mild interstitial
pulmonary edema. No pneumothorax or pleural effusion. Mild
cardiomegaly is present. Central pulmonary arteries appear enlarged.
No acute bone abnormality.
IMPRESSION: Interval development of mild interstitial pulmonary edema, possibly
cardiogenic in nature.

## 2021-08-24 NOTE — ED Triage Notes (Signed)
Pt reports shortness of breath that started earlier today, pt is a dialysis pt, last had dialysis Saturday. Pt reports slight cough. Pt denies chest pain.

## 2021-08-25 ENCOUNTER — Emergency Department
Admission: EM | Admit: 2021-08-25 | Discharge: 2021-08-25 | Disposition: A | Discharge status: Home / Self Care | Payer: Medicare Other | Attending: Emergency Medicine | Admitting: Emergency Medicine

## 2021-08-25 NOTE — ED Notes (Signed)
No answer when called several times from lobby 

## 2021-09-01 ENCOUNTER — Telehealth: Payer: Self-pay | Admitting: Emergency Medicine

## 2021-09-01 NOTE — Telephone Encounter (Signed)
Called patient due to left emergency department before provider exam to inquire about condition and follow up plans. Says he has been going to dialysys [facility].  Advised to also follow up with pcp.

## 2021-09-12 ENCOUNTER — Emergency Department
Admission: EM | Admit: 2021-09-12 | Discharge: 2021-09-13 | Disposition: A | Discharge status: Home / Self Care | Payer: Medicare Other | Attending: Student in an Organized Health Care Education/Training Program | Admitting: Student in an Organized Health Care Education/Training Program

## 2021-09-12 DIAGNOSIS — Z5321 Procedure and treatment not carried out due to patient leaving prior to being seen by health care provider: Secondary | ICD-10-CM | POA: Diagnosis not present

## 2021-09-12 DIAGNOSIS — Z4801 Encounter for change or removal of surgical wound dressing: Secondary | ICD-10-CM | POA: Diagnosis present

## 2021-09-12 LAB — CBC WITH DIFFERENTIAL/PLATELET
Abs Immature Granulocytes: 0.02 10*3/uL (ref 0.00–0.07)
Basophils Absolute: 0 10*3/uL (ref 0.0–0.1)
Basophils Relative: 1 %
Eosinophils Absolute: 0.5 10*3/uL (ref 0.0–0.5)
Eosinophils Relative: 7 %
HCT: 30.1 % — ABNORMAL LOW (ref 39.0–52.0)
Hemoglobin: 9.4 g/dL — ABNORMAL LOW (ref 13.0–17.0)
Immature Granulocytes: 0 %
Lymphocytes Relative: 14 %
Lymphs Abs: 0.9 10*3/uL (ref 0.7–4.0)
MCH: 27.9 pg (ref 26.0–34.0)
MCHC: 31.2 g/dL (ref 30.0–36.0)
MCV: 89.3 fL (ref 80.0–100.0)
Monocytes Absolute: 0.6 10*3/uL (ref 0.1–1.0)
Monocytes Relative: 8 %
Neutro Abs: 4.6 10*3/uL (ref 1.7–7.7)
Neutrophils Relative %: 70 %
Platelets: 282 10*3/uL (ref 150–400)
RBC: 3.37 MIL/uL — ABNORMAL LOW (ref 4.22–5.81)
RDW: 15.9 % — ABNORMAL HIGH (ref 11.5–15.5)
WBC: 6.6 10*3/uL (ref 4.0–10.5)
nRBC: 0 % (ref 0.0–0.2)

## 2021-09-12 LAB — COMPREHENSIVE METABOLIC PANEL
ALT: 22 U/L (ref 0–44)
AST: 23 U/L (ref 15–41)
Albumin: 3.5 g/dL (ref 3.5–5.0)
Alkaline Phosphatase: 91 U/L (ref 38–126)
Anion gap: 8 (ref 5–15)
BUN: 31 mg/dL — ABNORMAL HIGH (ref 8–23)
CO2: 21 mmol/L — ABNORMAL LOW (ref 22–32)
Calcium: 8.4 mg/dL — ABNORMAL LOW (ref 8.9–10.3)
Chloride: 97 mmol/L — ABNORMAL LOW (ref 98–111)
Creatinine, Ser: 6.13 mg/dL — ABNORMAL HIGH (ref 0.61–1.24)
GFR, Estimated: 10 mL/min — ABNORMAL LOW (ref >60)
Glucose, Bld: 102 mg/dL — ABNORMAL HIGH (ref 70–99)
Potassium: 4.5 mmol/L (ref 3.5–5.1)
Sodium: 126 mmol/L — ABNORMAL LOW (ref 135–145)
Total Bilirubin: 0.6 mg/dL (ref 0.3–1.2)
Total Protein: 8.1 g/dL (ref 6.5–8.1)

## 2021-09-12 NOTE — ED Triage Notes (Signed)
Pt presents via POV following dialysis this afternoon and was attempting to change his bandage and had a "significant" amount of bleeding. The patient has the fistula dressed and the bleeding is controlled. Denies dizziness, CP, or SOB.

## 2021-09-12 NOTE — ED Notes (Signed)
Pt no longer in lobby, no answer when called, per registration clerk pt informed them he was leaving.

## 2021-10-23 ENCOUNTER — Encounter: Payer: Self-pay | Admitting: Cardiology

## 2021-10-23 ENCOUNTER — Ambulatory Visit (INDEPENDENT_AMBULATORY_CARE_PROVIDER_SITE_OTHER): Payer: Medicare Other | Admitting: Cardiology

## 2021-10-23 ENCOUNTER — Other Ambulatory Visit: Payer: Self-pay

## 2021-10-23 VITALS — BP 140/70 | HR 84 | Ht 71.0 in | Wt 236.0 lb

## 2021-10-23 DIAGNOSIS — R0602 Shortness of breath: Secondary | ICD-10-CM | POA: Diagnosis not present

## 2021-10-23 DIAGNOSIS — I10 Essential (primary) hypertension: Secondary | ICD-10-CM
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
## 2021-10-23 NOTE — Patient Instructions (Signed)
Medication Instructions:  Your physician recommends that you continue on your current medications as directed. Please refer to the Current Medication list given to you today.  *If you need a refill on your cardiac medications before your next appointment, please call your pharmacy*   Lab Work: None ordered If you have labs (blood work) drawn today and your tests are completely normal, you will receive your results only by: [place] (if you have MyChart) OR A paper copy in the mail If you have any lab test that is abnormal or we need to change your treatment, we will call you to review the results.   Testing/Procedures: None ordered   Follow-Up: At [facility], you and your health needs are our priority.  As part of our continuing mission to provide you with exceptional heart care, we have created designated Provider Care Teams.  These Care Teams include your primary Cardiologist (physician) and Advanced Practice Providers (APPs -  Physician Assistants and Nurse Practitioners) who all work together to provide you with the care you need, when you need it.  We recommend signing up for the patient portal called "MyChart".  Sign up information is provided on this After Visit Summary.  MyChart is used to connect with patients for Virtual Visits (Telemedicine).  Patients are able to view lab/test results, encounter notes, upcoming appointments, etc.  Non-urgent messages can be sent to your provider as well.   To learn more about what you can do with MyChart, go to [email or web address].    Your next appointment:   1 year(s)  The format for your next appointment:   In Person  Provider:   You may see Kate Sable, MD or one of the following Advanced Practice Providers on your designated Care Team:   Murray Hodgkins, NP Christell Faith, PA-C Cadence Kathlen Mody, [place]    Other Instructions

## 2021-11-02 ENCOUNTER — Other Ambulatory Visit: Payer: Self-pay

## 2021-11-02 ENCOUNTER — Ambulatory Visit (INDEPENDENT_AMBULATORY_CARE_PROVIDER_SITE_OTHER): Payer: Medicare Other | Admitting: Vascular Surgery

## 2021-11-02 ENCOUNTER — Encounter (INDEPENDENT_AMBULATORY_CARE_PROVIDER_SITE_OTHER): Payer: Self-pay | Admitting: Vascular Surgery

## 2021-11-02 ENCOUNTER — Other Ambulatory Visit (INDEPENDENT_AMBULATORY_CARE_PROVIDER_SITE_OTHER): Payer: Self-pay | Admitting: Vascular Surgery

## 2021-11-02 ENCOUNTER — Ambulatory Visit (INDEPENDENT_AMBULATORY_CARE_PROVIDER_SITE_OTHER): Payer: Medicare Other

## 2021-11-02 VITALS — BP 213/110 | HR 88 | Resp 16 | Wt 244.6 lb

## 2021-11-02 DIAGNOSIS — E782 Mixed hyperlipidemia: Secondary | ICD-10-CM

## 2021-11-02 DIAGNOSIS — T829XXS Unspecified complication of cardiac and vascular prosthetic device, implant and graft, sequela: Secondary | ICD-10-CM | POA: Diagnosis not present

## 2021-11-02 DIAGNOSIS — E785 Hyperlipidemia, unspecified: Secondary | ICD-10-CM

## 2021-11-02 DIAGNOSIS — T82898S Other specified complication of vascular prosthetic devices, implants and grafts, sequela: Secondary | ICD-10-CM

## 2021-11-02 DIAGNOSIS — N186 End stage renal disease: Secondary | ICD-10-CM | POA: Diagnosis not present

## 2021-11-02 DIAGNOSIS — Z992 Dependence on renal dialysis: Secondary | ICD-10-CM

## 2021-11-02 DIAGNOSIS — I10 Essential (primary) hypertension: Secondary | ICD-10-CM | POA: Diagnosis not present

## 2021-11-02 DIAGNOSIS — E1169 Type 2 diabetes mellitus with other specified complication: Secondary | ICD-10-CM
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
## 2021-11-06 ENCOUNTER — Telehealth (INDEPENDENT_AMBULATORY_CARE_PROVIDER_SITE_OTHER): Payer: Self-pay

## 2021-11-06 NOTE — Telephone Encounter (Signed)
Spoke with the patient and he is scheduled with Dr. Delana Meyer for a left arm fistulagram with intervention on 11/18/21 with a 1:00 pm arrival time to the [facility]. Pre-procedure instructions were discussed and will be mailed. ?

## 2021-11-18 ENCOUNTER — Other Ambulatory Visit: Payer: Self-pay

## 2021-11-18 ENCOUNTER — Encounter: Payer: Self-pay | Admitting: Vascular Surgery

## 2021-11-18 ENCOUNTER — Ambulatory Visit
Admission: RE | Admit: 2021-11-18 | Discharge: 2021-11-18 | Disposition: A | Discharge status: Home / Self Care | Payer: Medicare Other | Attending: Vascular Surgery | Admitting: Vascular Surgery

## 2021-11-18 ENCOUNTER — Encounter
Admission: RE | Disposition: A | Discharge status: Home / Self Care | Payer: Self-pay | Source: Home / Self Care | Attending: Vascular Surgery

## 2021-11-18 DIAGNOSIS — I132 Hypertensive heart and chronic kidney disease with heart failure and with stage 5 chronic kidney disease, or end stage renal disease: Secondary | ICD-10-CM | POA: Insufficient documentation

## 2021-11-18 DIAGNOSIS — T82898A Other specified complication of vascular prosthetic devices, implants and grafts, initial encounter: Secondary | ICD-10-CM | POA: Insufficient documentation

## 2021-11-18 DIAGNOSIS — N186 End stage renal disease: Secondary | ICD-10-CM | POA: Diagnosis not present

## 2021-11-18 DIAGNOSIS — Y841 Kidney dialysis as the cause of abnormal reaction of the patient, or of later complication, without mention of misadventure at the time of the procedure: Secondary | ICD-10-CM | POA: Insufficient documentation

## 2021-11-18 DIAGNOSIS — I509 Heart failure, unspecified: Secondary | ICD-10-CM | POA: Insufficient documentation

## 2021-11-18 DIAGNOSIS — Z6834 Body mass index (BMI) 34.0-34.9, adult: Secondary | ICD-10-CM | POA: Diagnosis not present

## 2021-11-18 DIAGNOSIS — Z992 Dependence on renal dialysis: Secondary | ICD-10-CM | POA: Insufficient documentation

## 2021-11-18 DIAGNOSIS — T82858A Stenosis of vascular prosthetic devices, implants and grafts, initial encounter: Secondary | ICD-10-CM

## 2021-11-18 DIAGNOSIS — E782 Mixed hyperlipidemia: Secondary | ICD-10-CM | POA: Diagnosis not present

## 2021-11-18 DIAGNOSIS — E1122 Type 2 diabetes mellitus with diabetic chronic kidney disease: Secondary | ICD-10-CM | POA: Insufficient documentation

## 2021-11-18 DIAGNOSIS — T82590A Other mechanical complication of surgically created arteriovenous fistula, initial encounter: Secondary | ICD-10-CM | POA: Diagnosis not present

## 2021-11-18 LAB — POTASSIUM (ARMC VASCULAR LAB ONLY): Potassium (ARMC vascular lab): 4.6 (ref 3.5–5.1)

## 2021-11-18 LAB — GLUCOSE, CAPILLARY: Glucose-Capillary: 85 mg/dL (ref 70–99)

## 2021-11-18 SURGERY — A/V FISTULAGRAM
Anesthesia: Moderate Sedation | Laterality: Left

## 2021-11-18 MED ORDER — FENTANYL CITRATE (PF) 100 MCG/2ML IJ SOLN
INTRAMUSCULAR | Status: DC | PRN
  Administered 2021-11-18: 50 ug via INTRAVENOUS

## 2021-11-18 MED ORDER — ONDANSETRON HCL 4 MG/2ML IJ SOLN: 4.0000 mg | Freq: Four times a day (QID) | INTRAMUSCULAR | Status: DC | PRN

## 2021-11-18 MED ORDER — FAMOTIDINE 20 MG PO TABS: 40.0000 mg | ORAL_TABLET | Freq: Once | ORAL | Status: DC | PRN

## 2021-11-18 MED ORDER — CEFAZOLIN SODIUM-DEXTROSE 1-4 GM/50ML-% IV SOLN
INTRAVENOUS | Status: AC
  Administered 2021-11-18: 1 g via INTRAVENOUS
  Filled 2021-11-18: qty 50

## 2021-11-18 MED ORDER — CEFAZOLIN SODIUM-DEXTROSE 1-4 GM/50ML-% IV SOLN: 1.0000 g | Freq: Once | INTRAVENOUS | Status: AC

## 2021-11-18 MED ORDER — MIDAZOLAM HCL 2 MG/2ML IJ SOLN
INTRAMUSCULAR | Status: AC
  Filled 2021-11-18: qty 2

## 2021-11-18 MED ORDER — IODIXANOL 320 MG/ML IV SOLN
INTRAVENOUS | Status: DC | PRN
  Administered 2021-11-18: 35 mL via INTRAVENOUS

## 2021-11-18 MED ORDER — MIDAZOLAM HCL 2 MG/2ML IJ SOLN
INTRAMUSCULAR | Status: DC | PRN
  Administered 2021-11-18: 2 mg via INTRAVENOUS

## 2021-11-18 MED ORDER — DIPHENHYDRAMINE HCL 50 MG/ML IJ SOLN: 50.0000 mg | Freq: Once | INTRAMUSCULAR | Status: DC | PRN

## 2021-11-18 MED ORDER — FENTANYL CITRATE PF 50 MCG/ML IJ SOSY
PREFILLED_SYRINGE | INTRAMUSCULAR | Status: AC
  Filled 2021-11-18: qty 1

## 2021-11-18 MED ORDER — SODIUM CHLORIDE 0.9 % IV SOLN: INTRAVENOUS | Status: DC

## 2021-11-18 MED ORDER — MIDAZOLAM HCL 2 MG/ML PO SYRP: 8.0000 mg | ORAL_SOLUTION | Freq: Once | ORAL | Status: DC | PRN

## 2021-11-18 MED ORDER — HYDROMORPHONE HCL 1 MG/ML IJ SOLN: 1.0000 mg | Freq: Once | INTRAMUSCULAR | Status: DC | PRN

## 2021-11-18 MED ORDER — METHYLPREDNISOLONE SODIUM SUCC 125 MG IJ SOLR: 125.0000 mg | Freq: Once | INTRAMUSCULAR | Status: DC | PRN

## 2021-11-18 SURGICAL SUPPLY — 6 items
CANNULA 5F STIFF (CANNULA) ×1 IMPLANT
COVER PROBE U/S 5X48 (MISCELLANEOUS) ×1 IMPLANT
DRAPE BRACHIAL (DRAPES) ×1 IMPLANT
PACK ANGIOGRAPHY (CUSTOM PROCEDURE TRAY) ×2 IMPLANT
SHEATH BRITE TIP 6FRX5.5 (SHEATH) ×1 IMPLANT
SUT MNCRL AB 4-0 PS2 18 (SUTURE) ×1 IMPLANT

## 2021-11-18 NOTE — Op Note (Signed)
OPERATIVE NOTE ? ? ?PROCEDURE: ?Contrast injection the left arm brachiocephalic AV fistula ? ?PRE-OPERATIVE DIAGNOSIS: Complication of dialysis access with aneurysmal deterioration ?                                                      End Stage Renal Disease ? ?POST-OPERATIVE DIAGNOSIS: same as above  ? ?SURGEON: Katha Cabal, M.D. ? ?ANESTHESIA: Conscious sedation was administered under my direct supervision by the interventional radiology RN.  IV Versed plus fentanyl were utilized. Continuous ECG, pulse oximetry and blood pressure was monitored throughout the entire procedure.  Conscious sedation was for a total of 14 minutes. ? ?ESTIMATED BLOOD LOSS: minimal ? ?FINDING(S): ?Numerous large aneurysms.  80% stenosis at the cephalic subclavian confluence.  Otherwise the central veins are widely patent arterial anastomosis is widely patent ? ?SPECIMEN(S):  None ? ?CONTRAST: 35 cc ? ?FLUOROSCOPY TIME: 0.6 minutes ? ?INDICATIONS: ?Steve Vance is a 61 y.o. male who  presents with malfunctioning left arm AV access.  The patient is scheduled for angiography with possible intervention of the AV access to prevent loss of the permanent access.  The patient is aware the risks include but are not limited to: bleeding, infection, thrombosis of the cannulated access, and possible anaphylactic reaction to the contrast.  The patient acknowledges if the access can not be salvaged a tunneled catheter will be needed and will be placed during this procedure.  The patient is aware of the risks of the procedure and elects to proceed with the angiogram and intervention. ? ?DESCRIPTION: ?After full informed written consent was obtained, the patient was brought back to the Special Procedure suite and placed supine position.  Appropriate cardiopulmonary monitors were placed.  The left arm was prepped and draped in the standard fashion.  Appropriate timeout is called.  ? ?The left brachiocephalic access was cannulated with a  micropuncture needle under ultrasound guidence.  Ultrasound was used to evaluate the left brachiocephalic access.  It was echolucent and compressible indicating it is patent .  An ultrasound image was acquired for the permanent record.  A micropuncture needle was used to access the left brachiocephalic access under direct ultrasound guidance.  The microwire was then advanced under fluoroscopic guidance without difficulty followed by the micro-sheath.  The J-wire was then advanced and a 6 Fr sheath inserted.  Hand injections were completed to image the access from the arterial anastomosis through the entire access.  The central venous structures were also imaged by hand injections. ? ?Based on the images, there are multiple large aneurysms noted with marked tortuosity.  However the more proximal portion of the cephalic vein is of normal caliber and widely patent.  At the cephalic subclavian confluence there is a narrowing approximately 80%.  The subclavian vein widely patent as is the innominate and superior vena cava.  The arterial anastomosis and visualized portions of the brachial artery as well as the trifurcation appear widely patent.   ? ?A 4-0 Monocryl purse-string suture was sewn around the sheath.  The sheath was removed and light pressure was applied.  A sterile bandage was applied to the puncture site. ? ? ? ?COMPLICATIONS: None ? ?CONDITION: Good ? ?Katha Cabal, M.D ? [facility] ?Office: [phone] ? ?11/18/2021 2:50 PM ?  ?

## 2021-11-18 NOTE — Progress Notes (Signed)
Patient's recovery complete. Awaiting girlfriend to return to hospital to pick patient up for discharge.  ?

## 2021-11-18 NOTE — Interval H&P Note (Signed)
History and Physical Interval Note: ? ?11/18/2021 ?2:01 PM ? ?Steve Vance  has presented today for surgery, with the diagnosis of L arm fistulagram w intervention    End Stage Renal.  The various methods of treatment have been discussed with the patient and family. After consideration of risks, benefits and other options for treatment, the patient has consented to  Procedure(s): ?A/V Fistulagram (Left) as a surgical intervention.  The patient's history has been reviewed, patient examined, no change in status, stable for surgery.  I have reviewed the patient's chart and labs.  Questions were answered to the patient's satisfaction.   ? ? ?[name] ? ? ?

## 2021-11-19 ENCOUNTER — Encounter: Payer: Self-pay | Admitting: Vascular Surgery

## 2021-11-24 ENCOUNTER — Other Ambulatory Visit: Payer: Self-pay

## 2021-11-24 ENCOUNTER — Emergency Department
Admission: EM | Admit: 2021-11-24 | Discharge: 2021-11-25 | Disposition: A | Discharge status: Home / Self Care | Payer: Medicare Other | Attending: Emergency Medicine | Admitting: Emergency Medicine

## 2021-11-24 DIAGNOSIS — I132 Hypertensive heart and chronic kidney disease with heart failure and with stage 5 chronic kidney disease, or end stage renal disease: Secondary | ICD-10-CM | POA: Insufficient documentation

## 2021-11-24 DIAGNOSIS — I509 Heart failure, unspecified: Secondary | ICD-10-CM | POA: Diagnosis not present

## 2021-11-24 DIAGNOSIS — Z992 Dependence on renal dialysis: Secondary | ICD-10-CM | POA: Diagnosis not present

## 2021-11-24 DIAGNOSIS — Z8616 Personal history of COVID-19: Secondary | ICD-10-CM | POA: Diagnosis not present

## 2021-11-24 DIAGNOSIS — E1122 Type 2 diabetes mellitus with diabetic chronic kidney disease: Secondary | ICD-10-CM | POA: Diagnosis not present

## 2021-11-24 DIAGNOSIS — Z7982 Long term (current) use of aspirin: Secondary | ICD-10-CM | POA: Insufficient documentation

## 2021-11-24 DIAGNOSIS — E1121 Type 2 diabetes mellitus with diabetic nephropathy: Secondary | ICD-10-CM | POA: Diagnosis not present

## 2021-11-24 DIAGNOSIS — Z79899 Other long term (current) drug therapy: Secondary | ICD-10-CM | POA: Insufficient documentation

## 2021-11-24 DIAGNOSIS — T82598A Other mechanical complication of other cardiac and vascular devices and implants, initial encounter: Secondary | ICD-10-CM | POA: Diagnosis not present

## 2021-11-24 DIAGNOSIS — N186 End stage renal disease: Secondary | ICD-10-CM | POA: Insufficient documentation

## 2021-11-24 DIAGNOSIS — S40812A Abrasion of left upper arm, initial encounter: Secondary | ICD-10-CM | POA: Insufficient documentation

## 2021-11-24 DIAGNOSIS — X58XXXA Exposure to other specified factors, initial encounter: Secondary | ICD-10-CM | POA: Diagnosis not present

## 2021-11-24 DIAGNOSIS — T148XXA Other injury of unspecified body region, initial encounter: Secondary | ICD-10-CM

## 2021-11-24 DIAGNOSIS — S4991XA Unspecified injury of right shoulder and upper arm, initial encounter: Secondary | ICD-10-CM | POA: Diagnosis present

## 2021-11-24 NOTE — ED Triage Notes (Signed)
Pt presents to ER c/o left arm dialysis fistula bleeding.  Pt states he had and finished dialysis today and states his fistula site was itching, and he itched it until it bled.  Pt placed gauze and tape on wound and bleeding appears to be under control at this time.  Pt is A&O x4 at this time and just worried he wont be able to control bleeding at home.   ?
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
## 2021-11-25 NOTE — Discharge Instructions (Signed)
You may remove nonadherent dressing in the morning.  Next time you are itchy over your dialysis graft, do not scratch the area directly, apply a lotion or Vaseline.  Return to the ER for recurrent or worsening symptoms, fainting, persistent vomiting, difficulty breathing or other concerns. ?

## 2021-12-07 ENCOUNTER — Ambulatory Visit (INDEPENDENT_AMBULATORY_CARE_PROVIDER_SITE_OTHER): Payer: Medicare Other | Admitting: Nurse Practitioner

## 2021-12-25 ENCOUNTER — Observation Stay
Admission: EM | Admit: 2021-12-25 | Discharge: 2021-12-26 | Disposition: A | Discharge status: Home / Self Care | Payer: Medicare Other | Attending: Internal Medicine | Admitting: Internal Medicine

## 2021-12-25 ENCOUNTER — Emergency Department: Payer: Medicare Other

## 2021-12-25 DIAGNOSIS — F411 Generalized anxiety disorder: Secondary | ICD-10-CM | POA: Insufficient documentation

## 2021-12-25 DIAGNOSIS — Z79899 Other long term (current) drug therapy: Secondary | ICD-10-CM | POA: Insufficient documentation

## 2021-12-25 DIAGNOSIS — I130 Hypertensive heart and chronic kidney disease with heart failure and stage 1 through stage 4 chronic kidney disease, or unspecified chronic kidney disease: Secondary | ICD-10-CM | POA: Insufficient documentation

## 2021-12-25 DIAGNOSIS — E875 Hyperkalemia: Secondary | ICD-10-CM | POA: Insufficient documentation

## 2021-12-25 DIAGNOSIS — Z20822 Contact with and (suspected) exposure to covid-19: Secondary | ICD-10-CM | POA: Diagnosis not present

## 2021-12-25 DIAGNOSIS — E1122 Type 2 diabetes mellitus with diabetic chronic kidney disease: Secondary | ICD-10-CM | POA: Diagnosis not present

## 2021-12-25 DIAGNOSIS — E1121 Type 2 diabetes mellitus with diabetic nephropathy: Secondary | ICD-10-CM | POA: Insufficient documentation

## 2021-12-25 DIAGNOSIS — E8770 Fluid overload, unspecified: Secondary | ICD-10-CM | POA: Diagnosis not present

## 2021-12-25 DIAGNOSIS — R112 Nausea with vomiting, unspecified: Secondary | ICD-10-CM | POA: Diagnosis present

## 2021-12-25 DIAGNOSIS — I161 Hypertensive emergency: Secondary | ICD-10-CM | POA: Diagnosis present

## 2021-12-25 DIAGNOSIS — N189 Chronic kidney disease, unspecified: Secondary | ICD-10-CM | POA: Insufficient documentation

## 2021-12-25 DIAGNOSIS — N186 End stage renal disease: Secondary | ICD-10-CM

## 2021-12-25 DIAGNOSIS — R7989 Other specified abnormal findings of blood chemistry: Secondary | ICD-10-CM | POA: Diagnosis present

## 2021-12-25 DIAGNOSIS — R509 Fever, unspecified: Secondary | ICD-10-CM | POA: Diagnosis not present

## 2021-12-25 DIAGNOSIS — R0989 Other specified symptoms and signs involving the circulatory and respiratory systems: Secondary | ICD-10-CM | POA: Insufficient documentation

## 2021-12-25 DIAGNOSIS — I132 Hypertensive heart and chronic kidney disease with heart failure and with stage 5 chronic kidney disease, or end stage renal disease: Secondary | ICD-10-CM | POA: Insufficient documentation

## 2021-12-25 DIAGNOSIS — I5032 Chronic diastolic (congestive) heart failure: Secondary | ICD-10-CM | POA: Diagnosis present

## 2021-12-25 DIAGNOSIS — A0811 Acute gastroenteropathy due to Norwalk agent: Secondary | ICD-10-CM | POA: Insufficient documentation

## 2021-12-25 DIAGNOSIS — K219 Gastro-esophageal reflux disease without esophagitis: Secondary | ICD-10-CM | POA: Insufficient documentation

## 2021-12-25 DIAGNOSIS — Z8249 Family history of ischemic heart disease and other diseases of the circulatory system: Secondary | ICD-10-CM | POA: Diagnosis not present

## 2021-12-25 DIAGNOSIS — D631 Anemia in chronic kidney disease: Secondary | ICD-10-CM | POA: Insufficient documentation

## 2021-12-25 DIAGNOSIS — R778 Other specified abnormalities of plasma proteins: Secondary | ICD-10-CM

## 2021-12-25 DIAGNOSIS — E785 Hyperlipidemia, unspecified: Secondary | ICD-10-CM | POA: Diagnosis present

## 2021-12-25 DIAGNOSIS — R9431 Abnormal electrocardiogram [ECG] [EKG]: Secondary | ICD-10-CM | POA: Insufficient documentation

## 2021-12-25 DIAGNOSIS — Z7182 Exercise counseling: Secondary | ICD-10-CM | POA: Insufficient documentation

## 2021-12-25 DIAGNOSIS — Z7901 Long term (current) use of anticoagulants: Secondary | ICD-10-CM | POA: Insufficient documentation

## 2021-12-25 DIAGNOSIS — R059 Cough, unspecified: Secondary | ICD-10-CM | POA: Diagnosis not present

## 2021-12-25 DIAGNOSIS — Z992 Dependence on renal dialysis: Secondary | ICD-10-CM | POA: Diagnosis not present

## 2021-12-25 DIAGNOSIS — R000 Tachycardia, unspecified: Secondary | ICD-10-CM | POA: Insufficient documentation

## 2021-12-25 DIAGNOSIS — Z713 Dietary counseling and surveillance: Secondary | ICD-10-CM | POA: Insufficient documentation

## 2021-12-25 DIAGNOSIS — N2581 Secondary hyperparathyroidism of renal origin: Secondary | ICD-10-CM | POA: Insufficient documentation

## 2021-12-25 DIAGNOSIS — J810 Acute pulmonary edema: Secondary | ICD-10-CM | POA: Diagnosis not present

## 2021-12-25 DIAGNOSIS — R197 Diarrhea, unspecified: Secondary | ICD-10-CM | POA: Insufficient documentation

## 2021-12-25 DIAGNOSIS — I10 Essential (primary) hypertension: Secondary | ICD-10-CM | POA: Diagnosis present

## 2021-12-25 DIAGNOSIS — Z6833 Body mass index (BMI) 33.0-33.9, adult: Secondary | ICD-10-CM | POA: Insufficient documentation

## 2021-12-25 DIAGNOSIS — Z7982 Long term (current) use of aspirin: Secondary | ICD-10-CM | POA: Diagnosis not present

## 2021-12-25 DIAGNOSIS — R0682 Tachypnea, not elsewhere classified: Secondary | ICD-10-CM | POA: Insufficient documentation

## 2021-12-25 DIAGNOSIS — E8779 Other fluid overload: Secondary | ICD-10-CM | POA: Diagnosis not present

## 2021-12-25 DIAGNOSIS — Z8619 Personal history of other infectious and parasitic diseases: Secondary | ICD-10-CM | POA: Insufficient documentation

## 2021-12-25 DIAGNOSIS — R0602 Shortness of breath: Secondary | ICD-10-CM | POA: Diagnosis present

## 2021-12-25 LAB — COMPREHENSIVE METABOLIC PANEL
ALT: 19 U/L (ref 0–44)
AST: 24 U/L (ref 15–41)
Albumin: 3.6 g/dL (ref 3.5–5.0)
Alkaline Phosphatase: 87 U/L (ref 38–126)
Anion gap: 13 (ref 5–15)
BUN: 33 mg/dL — ABNORMAL HIGH (ref 8–23)
CO2: 27 mmol/L (ref 22–32)
Calcium: 8.8 mg/dL — ABNORMAL LOW (ref 8.9–10.3)
Chloride: 91 mmol/L — ABNORMAL LOW (ref 98–111)
Creatinine, Ser: 7.4 mg/dL — ABNORMAL HIGH (ref 0.61–1.24)
GFR, Estimated: 8 mL/min — ABNORMAL LOW (ref >60)
Glucose, Bld: 100 mg/dL — ABNORMAL HIGH (ref 70–99)
Potassium: 4.8 mmol/L (ref 3.5–5.1)
Sodium: 131 mmol/L — ABNORMAL LOW (ref 135–145)
Total Bilirubin: 0.7 mg/dL (ref 0.3–1.2)
Total Protein: 8.7 g/dL — ABNORMAL HIGH (ref 6.5–8.1)

## 2021-12-25 LAB — GASTROINTESTINAL PANEL BY PCR, STOOL (REPLACES STOOL CULTURE)

## 2021-12-25 LAB — HEPATITIS B SURFACE ANTIBODY,QUALITATIVE: Hep B S Ab: NONREACTIVE

## 2021-12-25 LAB — CBC
HCT: 38 % — ABNORMAL LOW (ref 39.0–52.0)
Hemoglobin: 11.3 g/dL — ABNORMAL LOW (ref 13.0–17.0)
MCH: 26.1 pg (ref 26.0–34.0)
MCHC: 29.7 g/dL — ABNORMAL LOW (ref 30.0–36.0)
MCV: 87.8 fL (ref 80.0–100.0)
Platelets: 197 10*3/uL (ref 150–400)
RBC: 4.33 MIL/uL (ref 4.22–5.81)
RDW: 17.7 % — ABNORMAL HIGH (ref 11.5–15.5)
WBC: 4.6 10*3/uL (ref 4.0–10.5)
nRBC: 0 % (ref 0.0–0.2)

## 2021-12-25 LAB — RESP PANEL BY RT-PCR (FLU A&B, COVID) ARPGX2
Influenza A by PCR: NEGATIVE
Influenza B by PCR: NEGATIVE
SARS Coronavirus 2 by RT PCR: NEGATIVE

## 2021-12-25 LAB — C DIFFICILE QUICK SCREEN W PCR REFLEX
C Diff antigen: NEGATIVE
C Diff interpretation: NOT DETECTED
C Diff toxin: NEGATIVE

## 2021-12-25 LAB — HIV ANTIBODY (ROUTINE TESTING W REFLEX): HIV Screen 4th Generation wRfx: NONREACTIVE

## 2021-12-25 LAB — GLUCOSE, CAPILLARY: Glucose-Capillary: 131 mg/dL — ABNORMAL HIGH (ref 70–99)

## 2021-12-25 LAB — LIPASE, BLOOD: Lipase: 45 U/L (ref 11–51)

## 2021-12-25 LAB — TROPONIN I (HIGH SENSITIVITY)
Troponin I (High Sensitivity): 50 ng/L — ABNORMAL HIGH (ref <18)
Troponin I (High Sensitivity): 52 ng/L — ABNORMAL HIGH (ref <18)

## 2021-12-25 LAB — HEPATITIS B SURFACE ANTIGEN: Hepatitis B Surface Ag: NONREACTIVE

## 2021-12-25 MED ORDER — CARVEDILOL 6.25 MG PO TABS
6.2500 mg | ORAL_TABLET | Freq: Two times a day (BID) | ORAL | Status: DC
  Administered 2021-12-25 – 2021-12-26 (×2): 6.25 mg via ORAL
  Filled 2021-12-25 (×2): qty 1

## 2021-12-25 MED ORDER — HYDRALAZINE HCL 20 MG/ML IJ SOLN
10.0000 mg | INTRAMUSCULAR | Status: DC | PRN
  Administered 2021-12-25: 10 mg via INTRAVENOUS
  Filled 2021-12-25: qty 1

## 2021-12-25 MED ORDER — HEPARIN SODIUM (PORCINE) 1000 UNIT/ML DIALYSIS
20.0000 [IU]/kg | INTRAMUSCULAR | Status: DC | PRN
Start: 2021-12-25 — End: 2021-12-25

## 2021-12-25 MED ORDER — LISINOPRIL 20 MG PO TABS
40.0000 mg | ORAL_TABLET | Freq: Every day | ORAL | Status: DC
  Administered 2021-12-26: 40 mg via ORAL
  Filled 2021-12-25: qty 2

## 2021-12-25 MED ORDER — ACETAMINOPHEN 325 MG PO TABS
650.0000 mg | ORAL_TABLET | Freq: Four times a day (QID) | ORAL | Status: DC | PRN
  Administered 2021-12-26: 650 mg via ORAL
  Filled 2021-12-25: qty 2

## 2021-12-25 MED ORDER — LABETALOL HCL 5 MG/ML IV SOLN: 10.0000 mg | Freq: Once | INTRAVENOUS | Status: DC

## 2021-12-25 MED ORDER — ATORVASTATIN CALCIUM 10 MG PO TABS
10.0000 mg | ORAL_TABLET | Freq: Every day | ORAL | Status: DC
  Administered 2021-12-25 – 2021-12-26 (×2): 10 mg via ORAL
  Filled 2021-12-25 (×3): qty 1

## 2021-12-25 MED ORDER — LORAZEPAM 2 MG/ML IJ SOLN
1.0000 mg | Freq: Once | INTRAMUSCULAR | Status: DC
  Filled 2021-12-25: qty 0.5

## 2021-12-25 MED ORDER — HYDROCHLOROTHIAZIDE 25 MG PO TABS
25.0000 mg | ORAL_TABLET | Freq: Every day | ORAL | Status: DC
  Administered 2021-12-26: 25 mg via ORAL
  Filled 2021-12-25: qty 1

## 2021-12-25 MED ORDER — ASPIRIN EC 81 MG PO TBEC
81.0000 mg | DELAYED_RELEASE_TABLET | Freq: Every day | ORAL | Status: DC
  Administered 2021-12-25 – 2021-12-26 (×2): 81 mg via ORAL
  Filled 2021-12-25 (×3): qty 1

## 2021-12-25 MED ORDER — CHLORHEXIDINE GLUCONATE CLOTH 2 % EX PADS
6.0000 | MEDICATED_PAD | Freq: Every day | CUTANEOUS | Status: DC
  Administered 2021-12-26: 6 via TOPICAL
  Filled 2021-12-25: qty 6

## 2021-12-25 MED ORDER — ALBUTEROL SULFATE (2.5 MG/3ML) 0.083% IN NEBU
2.5000 mg | INHALATION_SOLUTION | RESPIRATORY_TRACT | Status: DC | PRN
  Filled 2021-12-25: qty 3

## 2021-12-25 MED ORDER — DM-GUAIFENESIN ER 30-600 MG PO TB12
1.0000 | ORAL_TABLET | Freq: Two times a day (BID) | ORAL | Status: DC | PRN
  Administered 2021-12-25 – 2021-12-26 (×2): 1 via ORAL
  Filled 2021-12-25 (×4): qty 1

## 2021-12-25 MED ORDER — ONDANSETRON HCL 4 MG/2ML IJ SOLN
4.0000 mg | Freq: Three times a day (TID) | INTRAMUSCULAR | Status: DC | PRN
  Administered 2021-12-25: 4 mg via INTRAVENOUS
  Filled 2021-12-25: qty 2

## 2021-12-25 MED ORDER — NITROGLYCERIN 0.2 MG/HR TD PT24
0.2000 mg | MEDICATED_PATCH | Freq: Once | TRANSDERMAL | Status: DC
Start: 2021-12-25 — End: 2021-12-26
  Filled 2021-12-25: qty 1

## 2021-12-25 MED ORDER — LABETALOL HCL 5 MG/ML IV SOLN
20.0000 mg | Freq: Once | INTRAVENOUS | Status: AC
  Administered 2021-12-25: 20 mg via INTRAVENOUS
  Filled 2021-12-25: qty 4

## 2021-12-25 MED ORDER — CALCIUM ACETATE (PHOS BINDER) 667 MG PO CAPS
667.0000 mg | ORAL_CAPSULE | Freq: Three times a day (TID) | ORAL | Status: DC
  Administered 2021-12-26: 667 mg via ORAL
  Filled 2021-12-25 (×2): qty 1

## 2021-12-25 MED ORDER — HEPARIN SODIUM (PORCINE) 5000 UNIT/ML IJ SOLN
5000.0000 [IU] | Freq: Three times a day (TID) | INTRAMUSCULAR | Status: DC
  Administered 2021-12-25 – 2021-12-26 (×3): 5000 [IU] via SUBCUTANEOUS
  Filled 2021-12-25 (×3): qty 1

## 2021-12-25 MED ORDER — IPRATROPIUM-ALBUTEROL 0.5-2.5 (3) MG/3ML IN SOLN: 3.0000 mL | Freq: Four times a day (QID) | RESPIRATORY_TRACT | Status: DC | PRN

## 2021-12-25 MED ORDER — ONDANSETRON HCL 4 MG/2ML IJ SOLN
4.0000 mg | Freq: Once | INTRAMUSCULAR | Status: AC
  Administered 2021-12-25: 4 mg via INTRAVENOUS
  Filled 2021-12-25: qty 2

## 2021-12-25 MED ORDER — FAMOTIDINE 20 MG PO TABS
10.0000 mg | ORAL_TABLET | Freq: Every day | ORAL | Status: DC | PRN
  Administered 2021-12-25: 10 mg via ORAL
  Filled 2021-12-25: qty 1

## 2021-12-25 MED ORDER — HYDRALAZINE HCL 50 MG PO TABS
25.0000 mg | ORAL_TABLET | Freq: Three times a day (TID) | ORAL | Status: DC
  Administered 2021-12-25 – 2021-12-26 (×3): 25 mg via ORAL
  Filled 2021-12-25 (×3): qty 1

## 2021-12-25 MED ORDER — LABETALOL HCL 200 MG PO TABS
200.0000 mg | ORAL_TABLET | Freq: Two times a day (BID) | ORAL | Status: DC
  Administered 2021-12-25 – 2021-12-26 (×2): 200 mg via ORAL
  Filled 2021-12-25 (×2): qty 1

## 2021-12-25 MED ORDER — AMLODIPINE BESYLATE 10 MG PO TABS
10.0000 mg | ORAL_TABLET | Freq: Every day | ORAL | Status: DC
  Administered 2021-12-25 – 2021-12-26 (×2): 10 mg via ORAL
  Filled 2021-12-25: qty 2
  Filled 2021-12-25 (×2): qty 1

## 2021-12-25 NOTE — Assessment & Plan Note (Signed)
BMI 33.88, body weight 110.2 kg ?-Diet and exercise.   ?-Encouraged to lose weight. ? ?

## 2021-12-25 NOTE — Assessment & Plan Note (Signed)
Troponin 50, no chest pain.  Likely due to decreased clearance secondary to his ESRD.  Demand ischemia is also possible. ?-Continue aspirin, Lipitor and Coreg ?

## 2021-12-25 NOTE — Assessment & Plan Note (Signed)
Blood pressure 200/98, which improved to 172/84 after giving 20 mg of labetalol. ?-IV hydralazine as needed ?-Continue home blood pressure medications: Amlodipine, Coreg, hydralazine, HCTZ, labetalol, lisinopril ?

## 2021-12-25 NOTE — Assessment & Plan Note (Signed)
2D echo on 08/28/2020 showed EF of 55 to 60%.  Now patient has fluid overload and pulmonary edema ?-Volume management per renal by dialysis ?

## 2021-12-25 NOTE — Assessment & Plan Note (Addendum)
Patient has mild fever with body temperature 100.  No source of infection identified.  Patient has GI symptoms, nausea vomiting, diarrhea. ?-Follow-up blood culture ?-Follow-up C. difficile and GI pathogen panel due to diarrhea ?

## 2021-12-25 NOTE — Assessment & Plan Note (Signed)
Chest x-ray showed cardiomegaly and vascular congestion and bilateral basilar pulmonary edema.  Clinically consistent with fluid overload. Patient has been compliant to dialysis, last dialysis was yesterday.  His blood pressure is elevated at 200/98, which may have contributed partially to pulmonary edema. ?-Placed on PCU for observation ?-Consulted Dr. Candiss Norse of nephrology for urgent dialysis ?-As needed albuterol ?-Blood pressure control ?

## 2021-12-25 NOTE — Progress Notes (Signed)
Hemodialysis Post Treatment Note: ?  ?Treatment date: 12/25/2021 ?  ?Treatment time:  3 hours  ?  ?Access:  Left AVF ?  ?UF Removed: 2510 ml ?  ?Next Treatment: 12/26/21 ?  ?Patient started dialysis as ordered. Patient with nonproductive cough through treatment. Patient yelling out "please help me" through out treatment. When nurse inquired patient states he is just praying. patient tolerated well. No s/s of cardiopulmonary distress noted or reported. Report given to floor nurse Arnoldo Morale, RN. ?

## 2021-12-25 NOTE — Assessment & Plan Note (Signed)
-  f/u C. difficile PCR and GI pathogen panel ?

## 2021-12-25 NOTE — ED Notes (Addendum)
Pt transported to dialysis
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
## 2021-12-25 NOTE — ED Triage Notes (Signed)
Nausea, vomiting , diarrhea, dialysis yesterday , htn 203/110, bg 120 ?

## 2021-12-25 NOTE — Assessment & Plan Note (Signed)
-   Consulted to Dr. Candiss Norse of nephrology for dialysis ?

## 2021-12-25 NOTE — ED Notes (Addendum)
Pt SpO2 at 91% RA, Pt placed on 3L [place]. SpO2 currently at 100%.  ?

## 2021-12-25 NOTE — Plan of Care (Signed)
  Problem: Nutrition: Goal: Adequate nutrition will be maintained Outcome: Progressing   

## 2021-12-25 NOTE — Assessment & Plan Note (Signed)
Lipitor 

## 2021-12-25 NOTE — ED Provider Notes (Signed)
? ?[facility] ?Provider Note ? ? ? Event Date/Time  ? First MD Initiated Contact with Patient 12/25/21 1022   ?  (approximate) ? ?History  ? ?Chief Complaint: Nausea (Nausea, vomiting , diarrhea, dialysis yesterday , htn 203/110) ? ?HPI ? ?Steve Vance is a 61 y.o. male with a past medical history of anxiety, CHF, ESRD on HD Tuesday/Thursday/Saturday, diabetes, hypertension, received his last full HD session yesterday, presents to the emergency department for nausea and cough.  According to the patient since last night he has been nauseated with dry heaving he has been coughing with subjective fever/chills and feeling short of breath today.  Upon arrival patient states he is nauseated dry heaves several times.  States he feels short of breath but is currently satting 99% on room air.  Patient found to be hypertensive 210/105.  Denies any chest pain.  Denies any abdominal pain. ? ?Physical Exam  ? ?Triage Vital Signs: ?ED Triage Vitals  ?Enc Vitals Group  ?   BP 12/25/21 1022 (!) 210/105  ?   Pulse Rate 12/25/21 1019 89  ?   Resp 12/25/21 1019 17  ?   Temp 12/25/21 1019 98.8 ?F (37.1 ?C)  ?   Temp Source 12/25/21 1019 Oral  ?   SpO2 12/25/21 1019 99 %  ?   Weight --   ?   Height --   ?   Head Circumference --   ?   Peak Flow --   ?   Pain Score 12/25/21 1019 0  ?   Pain Loc --   ?   Pain Edu? --   ?   Excl. in [place]? --   ? ? ?Most recent vital signs: ?Vitals:  ? 12/25/21 1019 12/25/21 1022  ?BP:  (!) 210/105  ?Pulse: 89   ?Resp: 17   ?Temp: 98.8 ?F (37.1 ?C)   ?SpO2: 99%   ? ? ?General: Awake, no distress.  ?CV:  Good peripheral perfusion.  Regular rate and rhythm  ?Resp:  Normal effort.  Equal breath sounds bilaterally.  ?Abd:  No distention.  Soft, nontender.  No rebound or guarding. ?Other:  Left upper extremity fistula ? ? ?ED Results / Procedures / Treatments  ? ?EKG ? ?EKG viewed and interpreted by myself shows a normal sinus rhythm at 91 bpm with a narrow QRS, normal axis, normal  intervals, no concerning ST changes. ? ?RADIOLOGY ? ?Personally reviewed the chest x-ray images appears to show interstitial edema.  Pending radiology read. ? ? ?MEDICATIONS ORDERED IN ED: ?Medications  ?ondansetron (ZOFRAN) injection 4 mg (has no administration in time range)  ? ? ? ?IMPRESSION / MDM / ASSESSMENT AND PLAN / ED COURSE  ?I reviewed the triage vital signs and the nursing notes. ? ?Patient presents emergency department for nausea, dry heaving, cough, shortness of breath.  States symptoms started last night and worsened this morning.  Patient is quite hypertensive 735 systolic we will dose IV labetalol.  We will obtain a chest x-ray to evaluate for CHF/interstitial edema.  We will treat nausea with Zofran we will check labs including cardiac enzymes.  EKG is reassuring.  Differential is quite broad at this time but we will continue to work-up the patient while treating his symptoms. ? ?Patient continues to state he feels short of breath at times.  Blood pressures improving after labetalol currently 179/94.  Patient satting 99% on room air.  However given the patient's complaint of shortness of breath and chest x-ray  findings that appear to show interstitial edema we will place the patient on BiPAP. ? ?Patient could not tolerate BiPAP became very anxious and wanted the mask off nearly immediately.  We will continue to closely monitor off BiPAP. ? ?Lab work has shown a normal CBC.  Reassuring chemistry potassium currently 4.8.  Troponin is slightly elevated at 50.  COVID/flu negative.  Chest x-ray consistent with interstitial edema possibly pulmonary edema.  I spoke to Dr. Candiss Norse of nephrology who is currently evaluating the patient will arrange for dialysis later today.  We will discuss with the hospitalist for admission. ? ?FINAL CLINICAL IMPRESSION(S) / ED DIAGNOSES  ? ?Nausea ?Shortness of breath ?Fluid overload ?Pulmonary edema ? ?Note:  This document was prepared using Dragon voice recognition  software and may include unintentional dictation errors. ?  ?Harvest Dark, MD ?12/25/21 1146 ? ?

## 2021-12-25 NOTE — Assessment & Plan Note (Signed)
See above

## 2021-12-25 NOTE — ED Notes (Signed)
Pt continuing to cough and stating " help me; I cant breathe." Pt reassured that we are here to help him, and reminded the importance of being placed on Bipap. Pt refusing bipap at this time.  ?

## 2021-12-25 NOTE — Progress Notes (Signed)
[facility] ?[place], [place] ?12/25/21 ? ?Subjective:  ? ?LOS: 0 ? ?Patient known to our practice from outpatient dialysis.  Presented to the ER with nausea, vomiting and shortness of breath.  Blood pressure elevated at 203/110.  Patient's fianc? at his bedside.  They report that he has been drinking large amount of fluids. ?Last HD was Thursday.  Patient completed his treatment on 5 L of volume was removed. ? ? ?Objective:  ?Vital signs in last 24 hours:  ?Temp:  [98.8 ?F (37.1 ?C)-100 ?F (37.8 ?C)] 100 ?F (37.8 ?C) (04/21 1407) ?Pulse Rate:  [78-90] 85 (04/21 1615) ?Resp:  [17-34] 20 (04/21 1615) ?BP: (170-200)/(81-105) 197/100 (04/21 1615) ?SpO2:  [96 %-99 %] 98 % (04/21 1615) ?Weight:  [110.2 kg] 110.2 kg (04/21 1407) ? ?Weight change:  ?Filed Weights  ? 12/25/21 1407  ?Weight: 110.2 kg  ? ? ?Intake/Output: ?  ?No intake or output data in the 24 hours ending 12/25/21 1721 ? ? ?Physical Exam: ?General: Mild distress from shortness of breath  ?HEENT Nasal cannula oxygen  ?Pulm/lungs Bilateral crackles, tachypneic, [place] O2  ?CVS/Heart Tachycardic  ?Abdomen:  Soft, nontender  ?Extremities: 1-2+ pitting edema bilaterally  ?Neurologic: Alert, able to answer questions  ?Skin: No acute rashes  ?Access: AV fistula  ?   ? ? ?Basic Metabolic Panel: ? ?Recent Labs  ?Lab 12/25/21 ?1025  ?NA 131*  ?K 4.8  ?CL 91*  ?CO2 27  ?GLUCOSE 100*  ?BUN 33*  ?CREATININE 7.40*  ?CALCIUM 8.8*  ? ? ? ?CBC: ?Recent Labs  ?Lab 12/25/21 ?1025  ?WBC 4.6  ?HGB 11.3*  ?HCT 38.0*  ?MCV 87.8  ?PLT 197  ? ? ?  ?Lab Results  ?Component Value Date  ? HEPBSAG NON REACTIVE 04/28/2021  ? HEPBSAB NON REACTIVE 04/28/2021  ? HEPBIGM NON REACTIVE 08/27/2020  ? ? ? ? ?Microbiology: ? ?Recent Results (from the past 240 hour(s))  ?Resp Panel by RT-PCR (Flu A&B, Covid) Nasopharyngeal Swab     Status: None  ? Collection Time: 12/25/21 10:25 AM  ? Specimen: Nasopharyngeal Swab; Nasopharyngeal(NP) swabs in vial transport medium  ?Result Value Ref  Range Status  ? SARS Coronavirus 2 by RT PCR NEGATIVE NEGATIVE Final  ?  Comment: (NOTE) ?SARS-CoV-2 target nucleic acids are NOT DETECTED. ? ?The SARS-CoV-2 RNA is generally detectable in upper respiratory ?specimens during the acute phase of infection. The lowest ?concentration of SARS-CoV-2 viral copies this assay can detect is ?138 copies/mL. A negative result does not preclude SARS-Cov-2 ?infection and should not be used as the sole basis for treatment or ?other patient management decisions. A negative result may occur with  ?improper specimen collection/handling, submission of specimen other ?than nasopharyngeal swab, presence of viral mutation(s) within the ?areas targeted by this assay, and inadequate number of viral ?copies(<138 copies/mL). A negative result must be combined with ?clinical observations, patient history, and epidemiological ?information. The expected result is Negative. ? ?Fact Sheet for Patients:  ?EntrepreneurPulse.com.au ? ?Fact Sheet for Healthcare Providers:  ?IncredibleEmployment.be ? ?This test is no t yet approved or cleared by the [place] FDA and  ?has been authorized for detection and/or diagnosis of SARS-CoV-2 by ?FDA under an Emergency Use Authorization (EUA). This EUA will remain  ?in effect (meaning this test can be used) for the duration of the ?COVID-19 declaration under Section 564(b)(1) of the Act, 21 ?U.S.C.section 360bbb-3(b)(1), unless the authorization is terminated  ?or revoked sooner.  ? ? ?  ? Influenza A by PCR NEGATIVE  NEGATIVE Final  ? Influenza B by PCR NEGATIVE NEGATIVE Final  ?  Comment: (NOTE) ?The Xpert Xpress SARS-CoV-2/FLU/RSV plus assay is intended as an aid ?in the diagnosis of influenza from Nasopharyngeal swab specimens and ?should not be used as a sole basis for treatment. Nasal washings and ?aspirates are unacceptable for Xpert Xpress SARS-CoV-2/FLU/RSV ?testing. ? ?Fact Sheet for  Patients: ?EntrepreneurPulse.com.au ? ?Fact Sheet for Healthcare Providers: ?IncredibleEmployment.be ? ?This test is not yet approved or cleared by the [place] FDA and ?has been authorized for detection and/or diagnosis of SARS-CoV-2 by ?FDA under an Emergency Use Authorization (EUA). This EUA will remain ?in effect (meaning this test can be used) for the duration of the ?COVID-19 declaration under Section 564(b)(1) of the Act, 21 U.S.C. ?section 360bbb-3(b)(1), unless the authorization is terminated or ?revoked. ? ?Performed at [facility], [place], ?[place] [address] ?  ? ? ?Coagulation Studies: ?No results for input(s): LABPROT, INR in the last 72 hours. ? ?Urinalysis: ?No results for input(s): COLORURINE, LABSPEC, [place], GLUCOSEU, HGBUR, BILIRUBINUR, KETONESUR, PROTEINUR, UROBILINOGEN, NITRITE, LEUKOCYTESUR in the last 72 hours. ? ?Invalid input(s): APPERANCEUR  ? ? ?Imaging: ?DG Chest Portable 1 View ? ?Result Date: 12/25/2021 ?CLINICAL DATA:  Shortness of breath. EXAM: PORTABLE CHEST 1 VIEW COMPARISON:  August 24, 2021. FINDINGS: Stable cardiomegaly is noted with mild central pulmonary vascular congestion. Mild bibasilar pulmonary edema may be present. Bony thorax is unremarkable. IMPRESSION: Stable cardiomegaly with mild central pulmonary vascular congestion and possible mild bibasilar pulmonary edema. Electronically Signed   By: Marijo Conception M.D.   On: 12/25/2021 10:53   ? ? ?Medications:  ? ? ? amLODipine  10 mg Oral Daily  ? aspirin EC  81 mg Oral Daily  ? atorvastatin  10 mg Oral Daily  ? calcium acetate  667 mg Oral TID WC  ? carvedilol  6.25 mg Oral BID  ? Chlorhexidine Gluconate Cloth  6 each Topical Q0600  ? famotidine  20 mg Oral Daily  ? heparin  5,000 Units Subcutaneous Q8H  ? hydrALAZINE  25 mg Oral Q8H  ? hydrochlorothiazide  25 mg Oral Daily  ? labetalol  200 mg Oral BID  ? lisinopril  40 mg Oral Daily  ? LORazepam  1 mg  Intravenous Once in dialysis  ? ?acetaminophen, albuterol, dextromethorphan-guaiFENesin, heparin, hydrALAZINE, ipratropium-albuterol, ondansetron (ZOFRAN) IV ? ?Assessment/ Plan:  ?61 y.o. male with medical problems of end-stage renal disease, hypertension, diabetes type 2, hyperlipidemia, GERD, generalized anxiety, obesity, history of hepatitis C, was admitted on 12/25/2021 for ? ?Principal Problem: ?  Volume overload ?Active Problems: ?  Essential hypertension ?  Morbid obesity ([place]) ?  Hypertensive emergency ?  Hyperlipidemia ?  ESRD on dialysis [facility]) ?  Elevated troponin ?  Chronic diastolic CHF (congestive heart failure) ([place]) ?  Nausea vomiting and diarrhea ?  Fever ? ?Acute pulmonary edema ([facility]) [J81.0] ?Hypertensive urgency [I16.0] ?Hypertensive emergency [I16.1] ?Hypervolemia, unspecified hypervolemia type [E87.70] ? ?Notes [place]/TTS/[facility]/AV fistula ? ?#.  Acute pulmonary edema in setting of ESRD ?Patient completed his dialysis as outpatient yesterday with 5 L volume removed. ?He is still short of breath today with pulmonary edema.  Will arrange additional hemodialysis, UF only treatment.  Goal for fluid removal of 3 to 4 L as tolerated.  Orders prepared and nursing staff notified. ?Next HD on Saturday - Regular treatment ? ?#. Anemia of CKD ? ?Lab Results  ?Component Value Date  ? HGB 11.3 (L) 12/25/2021  ? ?Continue  monitor hemoglobin this admission. ?Current hemoglobin levels are acceptable. ? ?#. Secondary hyperparathyroidism of renal origin N 25.81  ? ?   ?Component Value Date/Time  ? PTH 301 (H) 09/24/2020 1647  ? ?Lab Results  ?Component Value Date  ? PHOS 6.2 (H) 08/14/2021  ? ?Monitor calcium and phos level during this admission ? ? ?#. Diabetes type 2 with CKD ?Hgb A1c MFr Bld (%)  ?Date Value  ?04/29/2021 5.6  ?Diet controlled ? ? LOS: 0 ?Steve Vance ?4/21/20235:21 PM ? ?[facility] ?[place], [place] ?[phone] ? ?

## 2021-12-26 DIAGNOSIS — E8770 Fluid overload, unspecified: Secondary | ICD-10-CM | POA: Diagnosis not present

## 2021-12-26 DIAGNOSIS — R197 Diarrhea, unspecified: Secondary | ICD-10-CM

## 2021-12-26 DIAGNOSIS — E8779 Other fluid overload: Secondary | ICD-10-CM | POA: Diagnosis not present

## 2021-12-26 DIAGNOSIS — R112 Nausea with vomiting, unspecified: Secondary | ICD-10-CM | POA: Diagnosis not present

## 2021-12-26 LAB — MRSA NEXT GEN BY PCR, NASAL: MRSA by PCR Next Gen: NOT DETECTED

## 2021-12-26 LAB — BASIC METABOLIC PANEL
Anion gap: 13 (ref 5–15)
BUN: 45 mg/dL — ABNORMAL HIGH (ref 8–23)
CO2: 26 mmol/L (ref 22–32)
Calcium: 8.8 mg/dL — ABNORMAL LOW (ref 8.9–10.3)
Chloride: 93 mmol/L — ABNORMAL LOW (ref 98–111)
Creatinine, Ser: 9.08 mg/dL — ABNORMAL HIGH (ref 0.61–1.24)
GFR, Estimated: 6 mL/min — ABNORMAL LOW (ref >60)
Glucose, Bld: 96 mg/dL (ref 70–99)
Potassium: 5.2 mmol/L — ABNORMAL HIGH (ref 3.5–5.1)
Sodium: 132 mmol/L — ABNORMAL LOW (ref 135–145)

## 2021-12-26 MED ORDER — ONDANSETRON HCL 4 MG PO TABS: 4.0000 mg | ORAL_TABLET | Freq: Every day | ORAL | 1 refills | Status: DC | PRN

## 2021-12-26 NOTE — Progress Notes (Signed)
[facility] ?[place], [place] ?12/26/21 ? ?Subjective:  ? ?LOS: 0 ? ?Patient known to our practice from outpatient dialysis.  Presented to the ER with nausea, vomiting and shortness of breath.  Blood pressure elevated at 203/110.  Patient's fianc? at his bedside.  They report that he has been drinking large amount of fluids. ?Last HD was Thursday.  Patient completed his treatment on 5 L of volume was removed. ? ?Patient seen and evaluated during dialysis ?  ?HEMODIALYSIS FLOWSHEET: ? ?Blood Flow Rate (mL/min): 300 mL/min ?Arterial Pressure (mmHg): -90 mmHg ?Venous Pressure (mmHg): 200 mmHg ?Transmembrane Pressure (mmHg): 80 mmHg ?Ultrafiltration Rate (mL/min): 1170 mL/min ?Dialysate Flow Rate (mL/min): 600 ml/min ?Conductivity: Machine : 13.7 ?Conductivity: Machine : 13.7 ?Dialysis Fluid Bolus: Normal Saline ?Bolus Amount (mL): 250 mL ? ?States he feels much improved today.  Admits to eating at least 4 large cups of ice daily. ? ? ?Objective:  ?Vital signs in last 24 hours:  ?Temp:  [98.7 ?F (37.1 ?C)-100 ?F (37.8 ?C)] 98.8 ?F (37.1 ?C) (04/22 1121) ?Pulse Rate:  [70-102] 79 (04/22 1245) ?Resp:  [15-34] 18 (04/22 1245) ?BP: (143-215)/(72-118) 163/86 (04/22 1245) ?SpO2:  [90 %-100 %] 95 % (04/22 1245) ?Weight:  [100.2 kg-110.2 kg] 100.2 kg (04/22 1121) ? ?Weight change:  ?Filed Weights  ? 12/25/21 2047 12/26/21 0352 12/26/21 1121  ?Weight: 100.6 kg 106.1 kg 100.2 kg  ? ? ?Intake/Output: ?  ? ?Intake/Output Summary (Last 24 hours) at 12/26/2021 1254 ?Last data filed at 12/26/2021 [phone] ?Gross per 24 hour  ?Intake 237 ml  ?Output 2510 ml  ?Net -2273 ml  ? ? ? ?Physical Exam: ?General: NAD, resting comfortably  ?HEENT Nasal cannula oxygen  ?Pulm/lungs Mild basilar crackles, tachypneic, [place] O2  ?CVS/Heart Tachycardic  ?Abdomen:  Soft, nontender  ?Extremities: 1-2+ pitting edema bilaterally  ?Neurologic: Alert, able to answer questions  ?Skin: No acute rashes  ?Access: AV fistula  ?   ? ? ?Basic Metabolic  Panel: ? ?Recent Labs  ?Lab 12/25/21 ?1025 12/26/21 ?0341  ?NA 131* 132*  ?K 4.8 5.2*  ?CL 91* 93*  ?CO2 27 26  ?GLUCOSE 100* 96  ?BUN 33* 45*  ?CREATININE 7.40* 9.08*  ?CALCIUM 8.8* 8.8*  ? ? ? ? ?CBC: ?Recent Labs  ?Lab 12/25/21 ?1025  ?WBC 4.6  ?HGB 11.3*  ?HCT 38.0*  ?MCV 87.8  ?PLT 197  ? ? ? ?  ?Lab Results  ?Component Value Date  ? HEPBSAG NON REACTIVE 12/25/2021  ? HEPBSAB NON REACTIVE 12/25/2021  ? HEPBIGM NON REACTIVE 08/27/2020  ? ? ? ? ?Microbiology: ? ?Recent Results (from the past 240 hour(s))  ?C Difficile Quick Screen w PCR reflex     Status: None  ? Collection Time: 12/25/21 10:17 AM  ? Specimen: STOOL  ?Result Value Ref Range Status  ? C Diff antigen NEGATIVE NEGATIVE Final  ? C Diff toxin NEGATIVE NEGATIVE Final  ? C Diff interpretation No C. difficile detected.  Final  ?  Comment: Performed at [facility], [address] [place], [place] [address]  ?Gastrointestinal Panel by PCR , Stool     Status: Abnormal  ? Collection Time: 12/25/21 10:17 AM  ? Specimen: STOOL  ?Result Value Ref Range Status  ? Campylobacter species NOT DETECTED NOT DETECTED Final  ? Plesimonas shigelloides NOT DETECTED NOT DETECTED Final  ? Salmonella species NOT DETECTED NOT DETECTED Final  ? Yersinia enterocolitica NOT DETECTED NOT DETECTED Final  ? Vibrio species NOT DETECTED NOT DETECTED Final  ?  Vibrio cholerae NOT DETECTED NOT DETECTED Final  ? Enteroaggregative E coli (EAEC) NOT DETECTED NOT DETECTED Final  ? Enteropathogenic E coli (EPEC) NOT DETECTED NOT DETECTED Final  ? Enterotoxigenic E coli (ETEC) NOT DETECTED NOT DETECTED Final  ? Shiga like toxin producing E coli (STEC) NOT DETECTED NOT DETECTED Final  ? Shigella/Enteroinvasive E coli (EIEC) NOT DETECTED NOT DETECTED Final  ? Cryptosporidium NOT DETECTED NOT DETECTED Final  ? Cyclospora cayetanensis NOT DETECTED NOT DETECTED Final  ? Entamoeba histolytica NOT DETECTED NOT DETECTED Final  ? Giardia lamblia NOT DETECTED NOT DETECTED Final  ?  Adenovirus F40/41 NOT DETECTED NOT DETECTED Final  ? Astrovirus NOT DETECTED NOT DETECTED Final  ? Norovirus GI/GII DETECTED (A) NOT DETECTED Final  ?  Comment: CRITICAL RESULT CALLED TO, READ BACK BY AND VERIFIED WITH: ?HUI PENG RN @ 2320 12/25/21 LFD ?  ? Rotavirus A NOT DETECTED NOT DETECTED Final  ? Sapovirus (I, II, IV, and V) NOT DETECTED NOT DETECTED Final  ?  Comment: Performed at [facility], [address], [place], [place] [address]  ?MRSA Next Gen by PCR, Nasal     Status: None  ? Collection Time: 12/25/21 10:17 AM  ? Specimen: Nasal Mucosa; Nasal Swab  ?Result Value Ref Range Status  ? MRSA by PCR Next Gen NOT DETECTED NOT DETECTED Final  ?  Comment: (NOTE) ?The GeneXpert MRSA Assay (FDA approved for NASAL specimens only), ?is one component of a comprehensive MRSA colonization surveillance ?program. It is not intended to diagnose MRSA infection nor to guide ?or monitor treatment for MRSA infections. ?Test performance is not FDA approved in patients less than 2 years ?old. ?Performed at [facility], [place], ?[place] [address] ?  ?Resp Panel by RT-PCR (Flu A&B, Covid) Nasopharyngeal Swab     Status: None  ? Collection Time: 12/25/21 10:25 AM  ? Specimen: Nasopharyngeal Swab; Nasopharyngeal(NP) swabs in vial transport medium  ?Result Value Ref Range Status  ? SARS Coronavirus 2 by RT PCR NEGATIVE NEGATIVE Final  ?  Comment: (NOTE) ?SARS-CoV-2 target nucleic acids are NOT DETECTED. ? ?The SARS-CoV-2 RNA is generally detectable in upper respiratory ?specimens during the acute phase of infection. The lowest ?concentration of SARS-CoV-2 viral copies this assay can detect is ?138 copies/mL. A negative result does not preclude SARS-Cov-2 ?infection and should not be used as the sole basis for treatment or ?other patient management decisions. A negative result may occur with  ?improper specimen collection/handling, submission of specimen other ?than nasopharyngeal swab,  presence of viral mutation(s) within the ?areas targeted by this assay, and inadequate number of viral ?copies(<138 copies/mL). A negative result must be combined with ?clinical observations, patient history, and epidemiological ?information. The expected result is Negative. ? ?Fact Sheet for Patients:  ?EntrepreneurPulse.com.au ? ?Fact Sheet for Healthcare Providers:  ?IncredibleEmployment.be ? ?This test is no t yet approved or cleared by the [place] FDA and  ?has been authorized for detection and/or diagnosis of SARS-CoV-2 by ?FDA under an Emergency Use Authorization (EUA). This EUA will remain  ?in effect (meaning this test can be used) for the duration of the ?COVID-19 declaration under Section 564(b)(1) of the Act, 21 ?U.S.C.section 360bbb-3(b)(1), unless the authorization is terminated  ?or revoked sooner.  ? ? ?  ? Influenza A by PCR NEGATIVE NEGATIVE Final  ? Influenza B by PCR NEGATIVE NEGATIVE Final  ?  Comment: (NOTE) ?The Xpert Xpress SARS-CoV-2/FLU/RSV plus assay is intended as an aid ?in the  diagnosis of influenza from Nasopharyngeal swab specimens and ?should not be used as a sole basis for treatment. Nasal washings and ?aspirates are unacceptable for Xpert Xpress SARS-CoV-2/FLU/RSV ?testing. ? ?Fact Sheet for Patients: ?EntrepreneurPulse.com.au ? ?Fact Sheet for Healthcare Providers: ?IncredibleEmployment.be ? ?This test is not yet approved or cleared by the [place] FDA and ?has been authorized for detection and/or diagnosis of SARS-CoV-2 by ?FDA under an Emergency Use Authorization (EUA). This EUA will remain ?in effect (meaning this test can be used) for the duration of the ?COVID-19 declaration under Section 564(b)(1) of the Act, 21 U.S.C. ?section 360bbb-3(b)(1), unless the authorization is terminated or ?revoked. ? ?Performed at [facility], [place], ?[place] [address] ?  ?Culture, blood  (Routine X 2) w Reflex to ID Panel     Status: None (Preliminary result)  ? Collection Time: 12/25/21  9:33 PM  ? Specimen: BLOOD  ?Result Value Ref Range Status  ? Specimen Description BLOOD [facility]  Final  ? Special

## 2021-12-26 NOTE — Progress Notes (Signed)
Hemodialysis Post Treatment Note: ? ?Tx date:12/26/2021 ?Tx time: 2 hous and 28 minutes ?Access: Left AVF ?UF Removed: 2357 ml ? ?Note: ? ?HD not completed. Terminated with 32 minutes remaining due to clot on the venous chamber causing extreme venous pressure. Patient is asymptomatic. No complaints. ? ? ? ? ? ? ? ? ?  ?

## 2021-12-26 NOTE — Discharge Summary (Signed)
?Physician Discharge Summary ?  ?Patient: Steve Vance MRN: [id number] DOB: Dec 21, 1960  ?Admit date:     12/25/2021  ?Discharge date: 12/26/21  ?Discharge Physician: Lorella Nimrod  ? ?PCP: Theotis Burrow, MD  ? ?Recommendations at discharge:  ?Please obtain BMP during next dialysis on Tuesday. ?Follow-up with primary care provider within a week ?Continue routine dialysis ? ?Discharge Diagnoses: ?Principal Problem: ?  Volume overload ?Active Problems: ?  Hypertensive emergency ?  Essential hypertension ?  ESRD on dialysis [facility]) ?  Morbid obesity ([place]) ?  Hyperlipidemia ?  Elevated troponin ?  Chronic diastolic CHF (congestive heart failure) ([place]) ?  Nausea vomiting and diarrhea ?  Fever ? ? ?Hospital Course: ?Steve Vance is a 60 y.o. male with medical history significant of ESRD-HD (TTS), hypertension, hyperlipidemia, GERD, anxiety, obesity, CHF, DDD, who presents with shortness of breath, nausea, vomiting, diarrhea. ?  ?Patient states that he developed shortness of breath today, which has been progressively worsening.  Patient has cough with clear mucus production.  No chest pain.  Patient also reports low-grade fever and chills. He states he has nausea, vomiting and diarrhea. Mostly dry heaves.  No abdominal pain.   He states that he has had 3 times of watery diarrhea today.  No symptoms of UTI.  He states that he had a full course of dialysis yesterday. ?  ?Patient was found to have elevated blood pressure 200/98, which improved to 172/84 after giving 20 mg of labetalol in ED. ?  ?Data Reviewed and ED Course: pt was found to have WBC 4.0, troponin level 50, negative COVID PCR, potassium 4.8, bicarbonate 27, creatinine 7.40, BUN 33, temperature 100.0, heart rate 89, RR 20, oxygen saturation 91% on room air . Patient could not tolerate the BiPAP.  GI pathogen panel came back positive for norovirus. ? Chest x-ray showed cardiomegaly, vascular congestion and mild bilateral basilar pulmonary edema.    ? ?Patient received dialysis on Saturday, developed mild hyperkalemia which will be taken care with dialysis.  Nausea, vomiting and diarrhea improved and he was feeling much improved except mild weakness. ? ?Patient had viral gastroenteritis and only need supportive care.  Symptoms improved and he is being discharged home with instructions to keep himself well-hydrated. ?He was also given some Zofran to be used only as needed for nausea and vomiting. ? ?Patient is on multiple antihypertensives and will continue his home medications and follow-up with his providers for further recommendations. ? ?Assessment and Plan: ?* Volume overload ?Chest x-ray showed cardiomegaly and vascular congestion and bilateral basilar pulmonary edema.  Clinically consistent with fluid overload. Patient has been compliant to dialysis, last dialysis was yesterday.  His blood pressure is elevated at 200/98, which may have contributed partially to pulmonary edema. ?-Placed on PCU for observation ?-Consulted Dr. Candiss Norse of nephrology for urgent dialysis ?-As needed albuterol ?-Blood pressure control ? ?Hypertensive emergency ?Blood pressure 200/98, which improved to 172/84 after giving 20 mg of labetalol. ?-IV hydralazine as needed ?-Continue home blood pressure medications: Amlodipine, Coreg, hydralazine, HCTZ, labetalol, lisinopril ? ?Essential hypertension ?See above ? ?ESRD on dialysis [facility]) ?- Consulted to Dr. Candiss Norse of nephrology for dialysis ? ?Fever ?Patient has mild fever with body temperature 100.  No source of infection identified.  Patient has GI symptoms, nausea vomiting, diarrhea. ?-Follow-up blood culture ?-Follow-up C. difficile and GI pathogen panel due to diarrhea ? ?Nausea vomiting and diarrhea ?-f/u C. difficile PCR and GI pathogen panel ? ?Chronic diastolic CHF (congestive heart failure) ([place]) ?2D echo  on 08/28/2020 showed EF of 55 to 60%.  Now patient has fluid overload and pulmonary edema ?-Volume management per renal by  dialysis ? ?Elevated troponin ?Troponin 50, no chest pain.  Likely due to decreased clearance secondary to his ESRD.  Demand ischemia is also possible. ?-Continue aspirin, Lipitor and Coreg ? ?Hyperlipidemia ?- Lipitor ? ?Morbid obesity ([place]) ?BMI 33.88, body weight 110.2 kg ?-Diet and exercise.   ?-Encouraged to lose weight. ? ? ? ?Consultants: Nephrology ?Procedures performed: Hemodialysis ?Disposition: Home ?Diet recommendation:  ?Discharge Diet Orders (From admission, onward)  ? ?  Start     Ordered  ? 12/26/21 0000  Diet - low sodium heart healthy       ? 12/26/21 1219  ? ?  ?  ? ?  ? ?Cardiac diet ?DISCHARGE MEDICATION: ?Allergies as of 12/26/2021   ? ?   Reactions  ? Baclofen Nausea And Vomiting  ? Gabapentin Nausea And Vomiting  ? Oxycodone-acetaminophen Nausea And Vomiting  ? ?  ? ?  ?Medication List  ?  ? ?TAKE these medications   ? ?Accu-Chek FastClix Lancets Misc ?USE TO CHECK BLOOD SUGAR UP TO 4 TIMES DAILY AS DIRECTED ?  ?albuterol 108 (90 Base) MCG/ACT inhaler ?Commonly known as: VENTOLIN HFA ?Inhale 1-2 puffs into the lungs every 4 (four) hours as needed for wheezing or shortness of breath. ?  ?amLODipine 10 MG tablet ?Commonly known as: NORVASC ?Take 1 tablet (10 mg total) by mouth daily. ?  ?aspirin 81 MG EC tablet ?Take 1 tablet (81 mg total) by mouth daily. ?  ?atorvastatin 10 MG tablet ?Commonly known as: LIPITOR ?Take 10 mg by mouth daily. ?  ?blood glucose meter kit and supplies Kit ?Dispense based on patient and insurance preference. Use up to four times daily as directed. (FOR ICD-9 250.00, 250.01). ?  ?calcium acetate 667 MG capsule ?Commonly known as: PHOSLO ?Take by mouth 3 (three) times daily with meals. ?  ?carvedilol 6.25 MG tablet ?Commonly known as: COREG ?Take 6.25 mg by mouth 2 (two) times daily. ?  ?famotidine 20 MG tablet ?Commonly known as: PEPCID ?Take 1 tablet (20 mg total) by mouth daily. ?  ?hydrALAZINE 25 MG tablet ?Commonly known as: APRESOLINE ?Take 1 tablet (25 mg total)  by mouth every 8 (eight) hours. ?  ?hydrochlorothiazide 25 MG tablet ?Commonly known as: HYDRODIURIL ?Take 25 mg by mouth daily. ?  ?ipratropium-albuterol 0.5-2.5 (3) MG/3ML Soln ?Commonly known as: DUONEB ?Take 3 mLs by nebulization every 6 (six) hours as needed (shortness of breath or wheezing.). ?  ?labetalol 200 MG tablet ?Commonly known as: NORMODYNE ?Take 200 mg by mouth 2 (two) times daily. ?  ?lisinopril 40 MG tablet ?Commonly known as: ZESTRIL ?Take 1 tablet (40 mg total) by mouth daily. ?  ?ondansetron 4 MG tablet ?Commonly known as: Zofran ?Take 1 tablet (4 mg total) by mouth daily as needed for nausea or vomiting. ?  ? ?  ? ? Follow-up Information   ? ? Revelo, Elyse Jarvis, MD. Schedule an appointment as soon as possible for a visit in 1 week(s).   ?Specialty: Family Medicine ?Contact information: ?[address] ?[address] ?[place] [address] ?[phone] ? ? ?  ?  ? ? Kate Sable, MD .   ?Specialties: Cardiology, Radiology ?Contact information: ?[place] [address] ?[phone] ? ? ?  ?  ? ?  ?  ? ?  ? ?Discharge Exam: ?Filed Weights  ? 12/25/21 2047 12/26/21 0352 12/26/21 1121  ?  Weight: 100.6 kg 106.1 kg 100.2 kg  ? ?General.     In no acute distress. ?Pulmonary.  Lungs clear bilaterally, normal respiratory effort. ?CV.  Regular rate and rhythm, no JVD, rub or murmur. ?Abdomen.  Soft, nontender, nondistended, BS positive. ?CNS.  Alert and oriented x3.  No focal neurologic deficit. ?Extremities.  No edema, no cyanosis, pulses intact and symmetrical. ?Psychiatry.  Judgment and insight appears normal.  ? ?Condition at discharge: stable ? ?The results of significant diagnostics from this hospitalization (including imaging, microbiology, ancillary and laboratory) are listed below for reference.  ? ?Imaging Studies: ?DG Chest Portable 1 View ? ?Result Date: 12/25/2021 ?CLINICAL DATA:  Shortness of breath. EXAM: PORTABLE CHEST 1 VIEW COMPARISON:  August 24, 2021. FINDINGS:  Stable cardiomegaly is noted with mild central pulmonary vascular congestion. Mild bibasilar pulmonary edema may be present. Bony thorax is unremarkable. IMPRESSION: Stable cardiomegaly with mild central pulmon

## 2021-12-27 LAB — HEPATITIS B SURFACE ANTIBODY, QUANTITATIVE: Hep B S AB Quant (Post): <3.1 m[IU]/mL — ABNORMAL LOW (ref >9.9)

## 2021-12-30 LAB — CULTURE, BLOOD (ROUTINE X 2)
Culture: NO GROWTH
Culture: NO GROWTH

## 2022-03-04 ENCOUNTER — Inpatient Hospital Stay
Admission: EM | Admit: 2022-03-04 | Discharge: 2022-03-10 | DRG: 314 | Disposition: A | Discharge status: Home / Self Care | Payer: Medicare Other | Source: Ambulatory Visit | Attending: Osteopathic Medicine | Admitting: Osteopathic Medicine

## 2022-03-04 ENCOUNTER — Emergency Department: Payer: Medicare Other

## 2022-03-04 DIAGNOSIS — I340 Nonrheumatic mitral (valve) insufficiency: Secondary | ICD-10-CM | POA: Diagnosis not present

## 2022-03-04 DIAGNOSIS — I10 Essential (primary) hypertension: Secondary | ICD-10-CM | POA: Diagnosis not present

## 2022-03-04 DIAGNOSIS — R7881 Bacteremia: Secondary | ICD-10-CM | POA: Diagnosis not present

## 2022-03-04 DIAGNOSIS — T80211A Bloodstream infection due to central venous catheter, initial encounter: Secondary | ICD-10-CM | POA: Diagnosis present

## 2022-03-04 DIAGNOSIS — J189 Pneumonia, unspecified organism: Secondary | ICD-10-CM | POA: Diagnosis present

## 2022-03-04 DIAGNOSIS — E785 Hyperlipidemia, unspecified: Secondary | ICD-10-CM | POA: Diagnosis present

## 2022-03-04 DIAGNOSIS — A401 Sepsis due to streptococcus, group B: Secondary | ICD-10-CM | POA: Diagnosis present

## 2022-03-04 DIAGNOSIS — E669 Obesity, unspecified: Secondary | ICD-10-CM | POA: Diagnosis not present

## 2022-03-04 DIAGNOSIS — I509 Heart failure, unspecified: Secondary | ICD-10-CM

## 2022-03-04 DIAGNOSIS — Q2112 Patent foramen ovale: Secondary | ICD-10-CM | POA: Diagnosis not present

## 2022-03-04 DIAGNOSIS — Z6834 Body mass index (BMI) 34.0-34.9, adult: Secondary | ICD-10-CM | POA: Diagnosis not present

## 2022-03-04 DIAGNOSIS — B1920 Unspecified viral hepatitis C without hepatic coma: Secondary | ICD-10-CM | POA: Diagnosis present

## 2022-03-04 DIAGNOSIS — M170 Bilateral primary osteoarthritis of knee: Secondary | ICD-10-CM | POA: Diagnosis present

## 2022-03-04 DIAGNOSIS — Z79899 Other long term (current) drug therapy: Secondary | ICD-10-CM

## 2022-03-04 DIAGNOSIS — N2581 Secondary hyperparathyroidism of renal origin: Secondary | ICD-10-CM | POA: Diagnosis present

## 2022-03-04 DIAGNOSIS — J9601 Acute respiratory failure with hypoxia: Secondary | ICD-10-CM | POA: Diagnosis present

## 2022-03-04 DIAGNOSIS — Y832 Surgical operation with anastomosis, bypass or graft as the cause of abnormal reaction of the patient, or of later complication, without mention of misadventure at the time of the procedure: Secondary | ICD-10-CM | POA: Diagnosis present

## 2022-03-04 DIAGNOSIS — Z809 Family history of malignant neoplasm, unspecified: Secondary | ICD-10-CM

## 2022-03-04 DIAGNOSIS — Z992 Dependence on renal dialysis: Secondary | ICD-10-CM

## 2022-03-04 DIAGNOSIS — M549 Dorsalgia, unspecified: Secondary | ICD-10-CM | POA: Diagnosis present

## 2022-03-04 DIAGNOSIS — E1122 Type 2 diabetes mellitus with diabetic chronic kidney disease: Secondary | ICD-10-CM | POA: Diagnosis present

## 2022-03-04 DIAGNOSIS — N186 End stage renal disease: Secondary | ICD-10-CM | POA: Diagnosis present

## 2022-03-04 DIAGNOSIS — D631 Anemia in chronic kidney disease: Secondary | ICD-10-CM | POA: Diagnosis present

## 2022-03-04 DIAGNOSIS — Z888 Allergy status to other drugs, medicaments and biological substances status: Secondary | ICD-10-CM

## 2022-03-04 DIAGNOSIS — I132 Hypertensive heart and chronic kidney disease with heart failure and with stage 5 chronic kidney disease, or end stage renal disease: Secondary | ICD-10-CM | POA: Diagnosis present

## 2022-03-04 DIAGNOSIS — E875 Hyperkalemia: Secondary | ICD-10-CM | POA: Diagnosis present

## 2022-03-04 DIAGNOSIS — Z8249 Family history of ischemic heart disease and other diseases of the circulatory system: Secondary | ICD-10-CM

## 2022-03-04 DIAGNOSIS — Z20822 Contact with and (suspected) exposure to covid-19: Secondary | ICD-10-CM | POA: Diagnosis present

## 2022-03-04 DIAGNOSIS — Z885 Allergy status to narcotic agent status: Secondary | ICD-10-CM

## 2022-03-04 DIAGNOSIS — R0600 Dyspnea, unspecified: Principal | ICD-10-CM

## 2022-03-04 DIAGNOSIS — G8929 Other chronic pain: Secondary | ICD-10-CM | POA: Diagnosis present

## 2022-03-04 DIAGNOSIS — B951 Streptococcus, group B, as the cause of diseases classified elsewhere: Secondary | ICD-10-CM | POA: Diagnosis not present

## 2022-03-04 DIAGNOSIS — R0602 Shortness of breath: Secondary | ICD-10-CM | POA: Diagnosis present

## 2022-03-04 DIAGNOSIS — I5033 Acute on chronic diastolic (congestive) heart failure: Secondary | ICD-10-CM | POA: Diagnosis present

## 2022-03-04 DIAGNOSIS — I5032 Chronic diastolic (congestive) heart failure: Secondary | ICD-10-CM | POA: Diagnosis present

## 2022-03-04 DIAGNOSIS — Z7982 Long term (current) use of aspirin: Secondary | ICD-10-CM

## 2022-03-04 DIAGNOSIS — R197 Diarrhea, unspecified: Secondary | ICD-10-CM | POA: Diagnosis present

## 2022-03-04 DIAGNOSIS — I361 Nonrheumatic tricuspid (valve) insufficiency: Secondary | ICD-10-CM | POA: Diagnosis not present

## 2022-03-04 DIAGNOSIS — R0902 Hypoxemia: Secondary | ICD-10-CM

## 2022-03-04 DIAGNOSIS — E8770 Fluid overload, unspecified: Secondary | ICD-10-CM | POA: Diagnosis present

## 2022-03-04 LAB — BLOOD GAS, VENOUS
Acid-Base Excess: 1.7 mmol/L (ref 0.0–2.0)
Bicarbonate: 26.6 mmol/L (ref 20.0–28.0)
O2 Saturation: 81.8 %
Patient temperature: 37
pCO2, Ven: 42 mmHg — ABNORMAL LOW (ref 44–60)
pH, Ven: 7.41 (ref 7.25–7.43)
pO2, Ven: 53 mmHg — ABNORMAL HIGH (ref 32–45)

## 2022-03-04 LAB — BRAIN NATRIURETIC PEPTIDE: B Natriuretic Peptide: 729.8 pg/mL — ABNORMAL HIGH (ref 0.0–100.0)

## 2022-03-04 LAB — COMPREHENSIVE METABOLIC PANEL
ALT: 20 U/L (ref 0–44)
AST: 23 U/L (ref 15–41)
Albumin: 3.8 g/dL (ref 3.5–5.0)
Alkaline Phosphatase: 85 U/L (ref 38–126)
Anion gap: 17 — ABNORMAL HIGH (ref 5–15)
BUN: 39 mg/dL — ABNORMAL HIGH (ref 8–23)
CO2: 20 mmol/L — ABNORMAL LOW (ref 22–32)
Calcium: 8.3 mg/dL — ABNORMAL LOW (ref 8.9–10.3)
Chloride: 98 mmol/L (ref 98–111)
Creatinine, Ser: 8.3 mg/dL — ABNORMAL HIGH (ref 0.61–1.24)
GFR, Estimated: 7 mL/min — ABNORMAL LOW (ref >60)
Glucose, Bld: 100 mg/dL — ABNORMAL HIGH (ref 70–99)
Potassium: 5.5 mmol/L — ABNORMAL HIGH (ref 3.5–5.1)
Sodium: 135 mmol/L (ref 135–145)
Total Bilirubin: 1.1 mg/dL (ref 0.3–1.2)
Total Protein: 8.6 g/dL — ABNORMAL HIGH (ref 6.5–8.1)

## 2022-03-04 LAB — CBC WITH DIFFERENTIAL/PLATELET
Abs Immature Granulocytes: 0.01 10*3/uL (ref 0.00–0.07)
Basophils Absolute: 0 10*3/uL (ref 0.0–0.1)
Basophils Relative: 0 %
Eosinophils Absolute: 0.1 10*3/uL (ref 0.0–0.5)
Eosinophils Relative: 2 %
HCT: 40.2 % (ref 39.0–52.0)
Hemoglobin: 12.3 g/dL — ABNORMAL LOW (ref 13.0–17.0)
Immature Granulocytes: 0 %
Lymphocytes Relative: 6 %
Lymphs Abs: 0.3 10*3/uL — ABNORMAL LOW (ref 0.7–4.0)
MCH: 26.9 pg (ref 26.0–34.0)
MCHC: 30.6 g/dL (ref 30.0–36.0)
MCV: 87.8 fL (ref 80.0–100.0)
Monocytes Absolute: 0.1 10*3/uL (ref 0.1–1.0)
Monocytes Relative: 2 %
Neutro Abs: 5.1 10*3/uL (ref 1.7–7.7)
Neutrophils Relative %: 90 %
Platelets: 181 10*3/uL (ref 150–400)
RBC: 4.58 MIL/uL (ref 4.22–5.81)
RDW: 18.3 % — ABNORMAL HIGH (ref 11.5–15.5)
WBC: 5.6 10*3/uL (ref 4.0–10.5)
nRBC: 0 % (ref 0.0–0.2)

## 2022-03-04 LAB — PROCALCITONIN: Procalcitonin: 0.34 ng/mL

## 2022-03-04 LAB — PROTIME-INR
INR: 1.1 (ref 0.8–1.2)
Prothrombin Time: 14.4 seconds (ref 11.4–15.2)

## 2022-03-04 LAB — LACTIC ACID, PLASMA: Lactic Acid, Venous: 1.8 mmol/L (ref 0.5–1.9)

## 2022-03-04 LAB — RESP PANEL BY RT-PCR (FLU A&B, COVID) ARPGX2
Influenza A by PCR: NEGATIVE
Influenza B by PCR: NEGATIVE
SARS Coronavirus 2 by RT PCR: NEGATIVE

## 2022-03-04 LAB — APTT: aPTT: 31 seconds (ref 24–36)

## 2022-03-04 LAB — HEPATITIS B SURFACE ANTIGEN: Hepatitis B Surface Ag: NONREACTIVE

## 2022-03-04 MED ORDER — ASPIRIN 81 MG PO TBEC
81.0000 mg | DELAYED_RELEASE_TABLET | Freq: Every day | ORAL | Status: DC
  Administered 2022-03-05 – 2022-03-10 (×6): 81 mg via ORAL
  Filled 2022-03-04 (×6): qty 1

## 2022-03-04 MED ORDER — LIDOCAINE-PRILOCAINE 2.5-2.5 % EX CREA
1.0000 | TOPICAL_CREAM | CUTANEOUS | Status: DC | PRN
Start: 2022-03-04 — End: 2022-03-04

## 2022-03-04 MED ORDER — DM-GUAIFENESIN ER 30-600 MG PO TB12
1.0000 | ORAL_TABLET | Freq: Two times a day (BID) | ORAL | Status: DC | PRN
  Administered 2022-03-09: 1 via ORAL
  Filled 2022-03-04: qty 1

## 2022-03-04 MED ORDER — SODIUM CHLORIDE 0.9 % IV BOLUS (SEPSIS)
250.0000 mL | Freq: Once | INTRAVENOUS | Status: AC
  Administered 2022-03-04: 250 mL via INTRAVENOUS

## 2022-03-04 MED ORDER — HEPARIN SODIUM (PORCINE) 5000 UNIT/ML IJ SOLN
5000.0000 [IU] | Freq: Three times a day (TID) | INTRAMUSCULAR | Status: DC
  Administered 2022-03-04 – 2022-03-10 (×15): 5000 [IU] via SUBCUTANEOUS
  Filled 2022-03-04 (×15): qty 1

## 2022-03-04 MED ORDER — FAMOTIDINE 20 MG PO TABS
20.0000 mg | ORAL_TABLET | Freq: Every day | ORAL | Status: DC
  Administered 2022-03-05 – 2022-03-10 (×6): 20 mg via ORAL
  Filled 2022-03-04 (×6): qty 1

## 2022-03-04 MED ORDER — SODIUM ZIRCONIUM CYCLOSILICATE 10 G PO PACK
10.0000 g | PACK | Freq: Once | ORAL | Status: AC
  Administered 2022-03-04: 10 g via ORAL
  Filled 2022-03-04: qty 1

## 2022-03-04 MED ORDER — ATORVASTATIN CALCIUM 10 MG PO TABS
10.0000 mg | ORAL_TABLET | Freq: Every day | ORAL | Status: DC
  Administered 2022-03-05 – 2022-03-10 (×6): 10 mg via ORAL
  Filled 2022-03-04 (×6): qty 1

## 2022-03-04 MED ORDER — PENTAFLUOROPROP-TETRAFLUOROETH EX AERO: 1.0000 | INHALATION_SPRAY | CUTANEOUS | Status: DC | PRN

## 2022-03-04 MED ORDER — SODIUM CHLORIDE 0.9 % IV SOLN
500.0000 mg | INTRAVENOUS | Status: DC
  Administered 2022-03-04 – 2022-03-05 (×2): 500 mg via INTRAVENOUS
  Filled 2022-03-04: qty 500
  Filled 2022-03-04: qty 5

## 2022-03-04 MED ORDER — CALCIUM ACETATE (PHOS BINDER) 667 MG PO CAPS
667.0000 mg | ORAL_CAPSULE | Freq: Three times a day (TID) | ORAL | Status: DC
  Administered 2022-03-05 – 2022-03-10 (×10): 667 mg via ORAL
  Filled 2022-03-04 (×11): qty 1

## 2022-03-04 MED ORDER — IPRATROPIUM-ALBUTEROL 0.5-2.5 (3) MG/3ML IN SOLN
3.0000 mL | RESPIRATORY_TRACT | Status: DC
  Administered 2022-03-04 – 2022-03-05 (×2): 3 mL via RESPIRATORY_TRACT
  Filled 2022-03-04 (×2): qty 3

## 2022-03-04 MED ORDER — LISINOPRIL 20 MG PO TABS
40.0000 mg | ORAL_TABLET | Freq: Every day | ORAL | Status: DC
  Administered 2022-03-05 – 2022-03-10 (×6): 40 mg via ORAL
  Filled 2022-03-04 (×6): qty 2

## 2022-03-04 MED ORDER — SODIUM CHLORIDE 0.9 % IV SOLN
2.0000 g | INTRAVENOUS | Status: DC
  Administered 2022-03-04: 2 g via INTRAVENOUS
  Filled 2022-03-04: qty 20

## 2022-03-04 MED ORDER — HEPARIN SODIUM (PORCINE) 1000 UNIT/ML DIALYSIS
1000.0000 [IU] | INTRAMUSCULAR | Status: DC | PRN
Start: 2022-03-04 — End: 2022-03-04

## 2022-03-04 MED ORDER — CHLORHEXIDINE GLUCONATE CLOTH 2 % EX PADS
6.0000 | MEDICATED_PAD | Freq: Every day | CUTANEOUS | Status: DC
  Administered 2022-03-05 – 2022-03-10 (×5): 6 via TOPICAL

## 2022-03-04 MED ORDER — ONDANSETRON HCL 4 MG/2ML IJ SOLN
4.0000 mg | Freq: Three times a day (TID) | INTRAMUSCULAR | Status: DC | PRN
  Administered 2022-03-08 – 2022-03-10 (×4): 4 mg via INTRAVENOUS
  Filled 2022-03-04 (×4): qty 2

## 2022-03-04 MED ORDER — AMLODIPINE BESYLATE 10 MG PO TABS
10.0000 mg | ORAL_TABLET | Freq: Every day | ORAL | Status: DC
  Administered 2022-03-05 – 2022-03-10 (×6): 10 mg via ORAL
  Filled 2022-03-04 (×6): qty 1

## 2022-03-04 MED ORDER — LISINOPRIL 20 MG PO TABS
20.0000 mg | ORAL_TABLET | Freq: Once | ORAL | Status: AC
Start: 2022-03-04 — End: 2022-03-04
  Administered 2022-03-04: 20 mg via ORAL
  Filled 2022-03-04: qty 1

## 2022-03-04 MED ORDER — HYDRALAZINE HCL 20 MG/ML IJ SOLN
5.0000 mg | INTRAMUSCULAR | Status: DC | PRN
  Administered 2022-03-07 – 2022-03-08 (×2): 5 mg via INTRAVENOUS
  Filled 2022-03-04 (×2): qty 1

## 2022-03-04 MED ORDER — CARVEDILOL 6.25 MG PO TABS
6.2500 mg | ORAL_TABLET | Freq: Two times a day (BID) | ORAL | Status: DC
  Administered 2022-03-04 – 2022-03-10 (×12): 6.25 mg via ORAL
  Filled 2022-03-04 (×11): qty 1

## 2022-03-04 MED ORDER — ALTEPLASE 2 MG IJ SOLR: 2.0000 mg | Freq: Once | INTRAMUSCULAR | Status: DC | PRN

## 2022-03-04 MED ORDER — LIDOCAINE HCL (PF) 1 % IJ SOLN: 5.0000 mL | INTRAMUSCULAR | Status: DC | PRN

## 2022-03-04 MED ORDER — LACTATED RINGERS IV SOLN: INTRAVENOUS | Status: DC

## 2022-03-04 MED ORDER — ACETAMINOPHEN 325 MG PO TABS
650.0000 mg | ORAL_TABLET | Freq: Four times a day (QID) | ORAL | Status: DC | PRN
  Administered 2022-03-04 – 2022-03-07 (×6): 650 mg via ORAL
  Filled 2022-03-04 (×6): qty 2

## 2022-03-04 MED ORDER — ALBUTEROL SULFATE (2.5 MG/3ML) 0.083% IN NEBU: 2.5000 mg | INHALATION_SOLUTION | RESPIRATORY_TRACT | Status: DC | PRN

## 2022-03-04 MED ORDER — AMLODIPINE BESYLATE 5 MG PO TABS
5.0000 mg | ORAL_TABLET | Freq: Once | ORAL | Status: AC
  Administered 2022-03-04: 5 mg via ORAL
  Filled 2022-03-04: qty 1

## 2022-03-04 NOTE — ED Notes (Signed)
Report given to 2A and dialysis called to confirm patient could go directly from treatment to floor without coming back to ER first.

## 2022-03-04 NOTE — ED Notes (Signed)
Attempted twice for an IV was able to get blood cultures x2 but not able to maintain IV. IV team consuilted. Unable able to start Sepsis fluids or antibiotics at this time.

## 2022-03-04 NOTE — ED Provider Notes (Signed)
Patient received in signout from Dr. Cinda Quest pending remainder of blood work and CXR in the setting of shortness of breath and cough.  Has a new O2 requirement and due to his vital sign derangements have signs of sepsis from community-acquired pneumonia seen on his CXR.  Sepsis protocols followed, antibiotics administered and we will consult medicine for admission.  Full fluid bolus not administered as he seems volume overloaded.  I consult with hospitalist Dr. Blaine Hamper, who agrees to admit.  Marland Kitchen1-3 Lead EKG Interpretation  Performed by: Vladimir Crofts, MD Authorized by: Vladimir Crofts, MD     Interpretation: abnormal     ECG rate:  103   ECG rate assessment: tachycardic     Rhythm: sinus tachycardia     Ectopy: none     Conduction: normal   .Critical Care  Performed by: Vladimir Crofts, MD Authorized by: Vladimir Crofts, MD   Critical care provider statement:    Critical care time (minutes):  30   Critical care time was exclusive of:  Separately billable procedures and treating other patients   Critical care was necessary to treat or prevent imminent or life-threatening deterioration of the following conditions:  Respiratory failure and sepsis   Critical care was time spent personally by me on the following activities:  Development of treatment plan with patient or surrogate, discussions with consultants, evaluation of patient's response to treatment, examination of patient, ordering and review of laboratory studies, ordering and review of radiographic studies, ordering and performing treatments and interventions, pulse oximetry, re-evaluation of patient's condition and review of old charts     Vladimir Crofts, MD 03/04/22 1601

## 2022-03-04 NOTE — Assessment & Plan Note (Signed)
Lipitor 

## 2022-03-04 NOTE — Assessment & Plan Note (Addendum)
-   IV hydralazine prn -Amlodipine, Coreg, lisinopril

## 2022-03-04 NOTE — Assessment & Plan Note (Signed)
-   Check C. difficile and GI pathogen panel

## 2022-03-04 NOTE — Assessment & Plan Note (Signed)
Patient has new 5 L oxygen requirement.  Possibly due to fluid overload.  Patient does not have leg edema, but has positive JVD, crackles on auscultation, chest x-ray showed bilateral new infiltration, which can also be due to interstitial edema.  EMS report patient had fever 100, but his temperature is 98.2 in ED.  No leukocytosis, blood procalcitonin is elevated at 0.34, cannot completely rule out pneumonia.  Antibiotics were started in ED, will continue antibiotics now.  Consulted Dr. Holley Raring of renal for dialysis.  -Admitted to PCU as inpatient -Bronchodilators -Volume management per renal by dialysis -IV Rocephin, azithromycin -Follow-up blood culture, sputum culture, urine antigen of Legionella and strep -Blood culture, sputum culture -Nasal cannula oxygen to maintain oxygen saturation above 93% -

## 2022-03-04 NOTE — Sepsis Progress Note (Signed)
eLink is following this Code Sepsis. °

## 2022-03-04 NOTE — Consult Note (Signed)
CODE SEPSIS - PHARMACY COMMUNICATION  **Broad Spectrum Antibiotics should be administered within 1 hour of Sepsis diagnosis**  Time Code Sepsis Called/Page Received: 1401  Antibiotics Ordered: rocephin, azithromycin  Time of 1st antibiotic administration: 4314  Additional action taken by pharmacy: trouble with IV per nursing team. IV team consulted. Were unable to start IVF or abx until after 1 hour window  If necessary, Name of Provider/Nurse Contacted: n/a    Steve Vance ,PharmD Clinical Pharmacist  03/04/2022  3:13 PM
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
## 2022-03-04 NOTE — Assessment & Plan Note (Signed)
2D echo on 08/28/2020 showed EF of 55 to 60%.  Now patient has fluid overload. -Volume management per renal by dialysis

## 2022-03-04 NOTE — Assessment & Plan Note (Signed)
-  see above.  Consulted Dr. Holley Raring of nephrology for dialysis.

## 2022-03-04 NOTE — ED Provider Notes (Signed)
[facility] Provider Note    Event Date/Time   First MD Initiated Contact with Patient 03/04/22 1350     (approximate)   History   Shortness of Breath   HPI  Steve Vance is a 61 y.o. male who was at dialysis today and got short of breath after only an hour of dialysis so was sent here.  Patient had 100 degree fever with EMS.  Here he has developed an oxygen requirement and is coughing a lot seems to be slightly altered.      Physical Exam   Triage Vital Signs: ED Triage Vitals  Enc Vitals Group     BP 03/04/22 1335 (!) 190/96     Pulse Rate 03/04/22 1335 (!) 108     Resp 03/04/22 1348 (!) 22     Temp 03/04/22 1335 98.2 F (36.8 C)     Temp Source 03/04/22 1335 Axillary     SpO2 03/04/22 1335 90 %     Weight 03/04/22 1341 214 lb (97.1 kg)     Height 03/04/22 1341 5\' 11"  (1.803 m)     Head Circumference --      Peak Flow --      Pain Score --      Pain Loc --      Pain Edu? --      Excl. in GC? --     Most recent vital signs: Vitals:   03/04/22 1348 03/04/22 1500  BP:  (!) 148/85  Pulse:  (!) 103  Resp: (!) 22 (!) 21  Temp:    SpO2:  90%     General: Awake, alert coughing a lot asking for help CV:  Good peripheral perfusion.  Heart regular rate and rhythm no audible murmurs Resp:  Normal effort.  Somewhat increased effort or seem to be some crackles in the left base Abd:  No distention.  Soft and nontender Extremities with bilateral edema.  At least 1+.   ED Results / Procedures / Treatments   Labs (all labs ordered are listed, but only abnormal results are displayed) Labs Reviewed  COMPREHENSIVE METABOLIC PANEL - Abnormal; Notable for the following components:      Result Value   Potassium 5.5 (*)    CO2 20 (*)    Glucose, Bld 100 (*)    BUN 39 (*)    Creatinine, Ser 8.30 (*)    Calcium 8.3 (*)    Total Protein 8.6 (*)    GFR, Estimated 7 (*)    Anion gap 17 (*)    All other components within normal limits  CBC  WITH DIFFERENTIAL/PLATELET - Abnormal; Notable for the following components:   Hemoglobin 12.3 (*)    RDW 18.3 (*)    Lymphs Abs 0.3 (*)    All other components within normal limits  BLOOD GAS, VENOUS - Abnormal; Notable for the following components:   pCO2, Ven 42 (*)    pO2, Ven 53 (*)    All other components within normal limits  BRAIN NATRIURETIC PEPTIDE - Abnormal; Notable for the following components:   B Natriuretic Peptide 729.8 (*)    All other components within normal limits  RESP PANEL BY RT-PCR (FLU A&B, COVID) ARPGX2  CULTURE, BLOOD (ROUTINE X 2)  CULTURE, BLOOD (ROUTINE X 2)  URINE CULTURE  PROTIME-INR  APTT  LACTIC ACID, PLASMA  LACTIC ACID, PLASMA  URINALYSIS, COMPLETE (UACMP) WITH MICROSCOPIC     EKG  EKG read interpreted by me shows  sinus tachycardia at 106 left axis flipped T's 1L and V6 but this is similar to EKG from April of this year.   RADIOLOGY Chest x-ray shows an enlarged heart apparent increased vascular markings and possibly some infiltrate behind the heart possibly that is where his crackles are.   PROCEDURES:  Critical Care performed: [place] care time 20 minutes this includes evaluating the patient going back and seeing him again later reviewing his studies and old records.  Procedures   MEDICATIONS ORDERED IN ED: Medications  lactated ringers infusion (has no administration in time range)  sodium chloride 0.9 % bolus 250 mL (250 mLs Intravenous New Bag/Given 03/04/22 1505)  cefTRIAXone (ROCEPHIN) 2 g in sodium chloride 0.9 % 100 mL IVPB (2 g Intravenous New Bag/Given 03/04/22 1505)  azithromycin (ZITHROMAX) 500 mg in sodium chloride 0.9 % 250 mL IVPB (has no administration in time range)     IMPRESSION / MDM / ASSESSMENT AND PLAN / ED COURSE  I reviewed the triage vital signs and the nursing notes. Patient looks like he has congestive failure and possibly sepsis.  Differential diagnosis includes, but is not limited to, likely has CHF  and pneumonia.  Patient's presentation is most consistent with acute presentation with potential threat to life or bodily function.  The patient is on the cardiac monitor to evaluate for evidence of arrhythmia and/or significant heart rate changes. I have signed the patient out to oncoming provider who is working on contacting the admitting doctor and dialysis.  FINAL CLINICAL IMPRESSION(S) / ED DIAGNOSES   Final diagnoses:  Dyspnea, unspecified type  Hypoxia  Community acquired pneumonia, unspecified laterality  Congestive heart failure, unspecified HF chronicity, unspecified heart failure type ([place])  Hypervolemia, unspecified hypervolemia type     Rx / DC Orders   ED Discharge Orders     None        Note:  This document was prepared using Dragon voice recognition software and may include unintentional dictation errors.   Nena Polio, MD 03/04/22 1534

## 2022-03-04 NOTE — Assessment & Plan Note (Signed)
-   Consulted Dr. Holley Raring of nephrology for dialysis

## 2022-03-04 NOTE — Assessment & Plan Note (Signed)
Diet-controlled diabetes.  Recent A1c 5.6.  Well-controlled.  Patient is not taking medications.  Blood sugar 100. -No treatment needed now

## 2022-03-04 NOTE — Assessment & Plan Note (Signed)
Potassium 5.5 -10 gram of Lokelma -Dialysis per renal

## 2022-03-04 NOTE — Progress Notes (Signed)
Hemodialysis Post Treatment Note:  Tx date:03/04/2022 Tx time:3 hours Access: Left AVF UF Removed: 3 L  Note: HD treatment completed. Tolerated well. No HD complications noted. Patient asymptomatic.

## 2022-03-04 NOTE — ED Triage Notes (Signed)
Patient at dialysis started with [facility] and nausea and vomiting. Only got 1 hr of dialysis. EMS gave $Remove'4mg'[id number]$  IM zofran.Marland Kitchen

## 2022-03-05 DIAGNOSIS — R197 Diarrhea, unspecified: Secondary | ICD-10-CM

## 2022-03-05 DIAGNOSIS — J9601 Acute respiratory failure with hypoxia: Secondary | ICD-10-CM | POA: Diagnosis not present

## 2022-03-05 DIAGNOSIS — N186 End stage renal disease: Secondary | ICD-10-CM | POA: Diagnosis not present

## 2022-03-05 DIAGNOSIS — B951 Streptococcus, group B, as the cause of diseases classified elsewhere: Secondary | ICD-10-CM | POA: Diagnosis not present

## 2022-03-05 DIAGNOSIS — Z992 Dependence on renal dialysis: Secondary | ICD-10-CM | POA: Diagnosis not present

## 2022-03-05 DIAGNOSIS — R7881 Bacteremia: Secondary | ICD-10-CM | POA: Diagnosis not present

## 2022-03-05 LAB — GASTROINTESTINAL PANEL BY PCR, STOOL (REPLACES STOOL CULTURE)

## 2022-03-05 LAB — HEPATITIS B CORE ANTIBODY, TOTAL: Hep B Core Total Ab: REACTIVE — AB

## 2022-03-05 LAB — CBC
HCT: 34.1 % — ABNORMAL LOW (ref 39.0–52.0)
Hemoglobin: 10.6 g/dL — ABNORMAL LOW (ref 13.0–17.0)
MCH: 26.9 pg (ref 26.0–34.0)
MCHC: 31.1 g/dL (ref 30.0–36.0)
MCV: 86.5 fL (ref 80.0–100.0)
Platelets: 169 10*3/uL (ref 150–400)
RBC: 3.94 MIL/uL — ABNORMAL LOW (ref 4.22–5.81)
RDW: 18.3 % — ABNORMAL HIGH (ref 11.5–15.5)
WBC: 8.6 10*3/uL (ref 4.0–10.5)
nRBC: 0 % (ref 0.0–0.2)

## 2022-03-05 LAB — BLOOD CULTURE ID PANEL (REFLEXED) - BCID2

## 2022-03-05 LAB — HEPATITIS B SURFACE ANTIBODY,QUALITATIVE: Hep B S Ab: NONREACTIVE

## 2022-03-05 LAB — BASIC METABOLIC PANEL
Anion gap: 13 (ref 5–15)
BUN: 29 mg/dL — ABNORMAL HIGH (ref 8–23)
CO2: 26 mmol/L (ref 22–32)
Calcium: 8.5 mg/dL — ABNORMAL LOW (ref 8.9–10.3)
Chloride: 97 mmol/L — ABNORMAL LOW (ref 98–111)
Creatinine, Ser: 7.08 mg/dL — ABNORMAL HIGH (ref 0.61–1.24)
GFR, Estimated: 8 mL/min — ABNORMAL LOW (ref >60)
Glucose, Bld: 109 mg/dL — ABNORMAL HIGH (ref 70–99)
Potassium: 4.5 mmol/L (ref 3.5–5.1)
Sodium: 136 mmol/L (ref 135–145)

## 2022-03-05 LAB — URINALYSIS, COMPLETE (UACMP) WITH MICROSCOPIC
Bilirubin Urine: NEGATIVE
Glucose, UA: 50 mg/dL — AB
Ketones, ur: NEGATIVE mg/dL
Nitrite: NEGATIVE
Protein, ur: >=300 mg/dL — AB
RBC / HPF: >50 RBC/hpf — ABNORMAL HIGH (ref 0–5)
Specific Gravity, Urine: 1.016 (ref 1.005–1.030)
WBC, UA: >50 WBC/hpf — ABNORMAL HIGH (ref 0–5)
pH: 7 (ref 5.0–8.0)

## 2022-03-05 LAB — C DIFFICILE QUICK SCREEN W PCR REFLEX
C Diff antigen: NEGATIVE
C Diff interpretation: NOT DETECTED
C Diff toxin: NEGATIVE

## 2022-03-05 LAB — HEPATITIS C ANTIBODY: HCV Ab: REACTIVE — AB

## 2022-03-05 MED ORDER — CEFAZOLIN SODIUM-DEXTROSE 1-4 GM/50ML-% IV SOLN
1.0000 g | INTRAVENOUS | Status: DC
  Administered 2022-03-05 – 2022-03-06 (×3): 1 g via INTRAVENOUS
  Filled 2022-03-05 (×3): qty 50
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
## 2022-03-05 NOTE — Progress Notes (Signed)
Pharmacy Antibiotic Note  Steve Vance is a 61 y.o. male admitted on 03/04/2022 with bacteremia.  Pharmacy has been consulted for Cefazolin dosing.  Plan: Cefazolin 1 gm q24h per indication and ESRD on HD status.  Pharmacy will continue to follow and adjust abx dosing if warranted.  Height: $Remove'5\' 7"'ldCLveP$  (170.2 cm) Weight: 100.1 kg (220 lb 11.2 oz) IBW/kg (Calculated) : 66.1  Temp (24hrs), Avg:98.5 F (36.9 C), Min:97.6 F (36.4 C), Max:99.9 F (37.7 C)  Recent Labs  Lab 03/04/22 1412  WBC 5.6  CREATININE 8.30*  LATICACIDVEN 1.8    Estimated Creatinine Clearance: 10.4 mL/min (A) (by C-G formula based on SCr of 8.3 mg/dL (H)).    Allergies  Allergen Reactions   Baclofen Nausea And Vomiting   Gabapentin Nausea And Vomiting   Oxycodone-Acetaminophen Nausea And Vomiting    Antimicrobials this admission: 6/29 Ceftriaxone >> x 1 dose 6/29 Azithromycin >> x 5 days 6/30 Cefazolin >>  Microbiology results: 6/29 BCx: 3 of 4 bottles w/ Strep agalactiae, no resistance 6/29 UCx: Pending  6/29 Sputum: Pending   Thank you for allowing pharmacy to be a part of this patient's care.  Renda Rolls, PharmD, [facility] 03/05/2022 4:37 AM

## 2022-03-05 NOTE — Progress Notes (Signed)
PHARMACY - PHYSICIAN COMMUNICATION CRITICAL VALUE ALERT - BLOOD CULTURE IDENTIFICATION (BCID)  BCID Results:  3 of 4 bottles w/ Strep agalactiae (Group B), no resistance.  Pt currently on Azithromycin and Ceftriaxone for ARF.  Recommend therapy is PCN-G, followed by Cefazolin, then Vanc.  Appears pt does not have any abx allergies.   Name of provider contacted: Rachael Fee, NP  Changes to prescribed antibiotics required: Transition Ceftriaxone to Cefazolin, continue Azithromycin at this time.  Renda Rolls, PharmD, [facility] 03/05/2022 3:45 AM

## 2022-03-05 NOTE — Progress Notes (Signed)
[facility]  ROUNDING NOTE   Subjective:   Steve Vance is a 61 male with past medical history including hypertension, diastolic chronic heart failure, depression, hypertension, and end-stage renal disease on hemodialysis.  Patient presents to the emergency department from his dialysis clinic with complaints of shortness of breath and chest discomfort.  Patient has been admitted for Hypoxia [R09.02] Acute respiratory failure with hypoxia (HCC) [J96.01] Hypervolemia, unspecified hypervolemia type [E87.70] Dyspnea, unspecified type [R06.00] Community acquired pneumonia, unspecified laterality [J18.9] Congestive heart failure, unspecified HF chronicity, unspecified heart failure type ([place]) [I50.9]  Patient is known to our practice and receives outpatient dialysis treatments at [facility] on a TTS schedule, supervised by Dr. Candiss Norse.  Patient had presented to the outpatient clinic about 10 pounds over his dry weight.  Outpatient clinic attempted to remove 4 L of fluid and patient had been in treatment for 1 hour before he began complaining of shortness of breath and chest discomfort.  Treatment terminated at that time and patient sent to emergency department for further evaluation.  Patient presented to the emergency department 5 L nasal cannula with productive cough.  Patient normally is on room air at baseline.  Denies chest discomfort on ED arrival.  Low-grade temp noted by EMS, 100 F.  Also reported several days of diarrhea, nausea, and dry heaves.  Denies abdominal pain.  Patient seen sitting at side of bed this morning, completing breakfast.  Patient does states he needs more education in terms of fluid restriction.  He states this was mentioned at his outpatient clinic and education was in progress.  He states he does feel better than when he was first admitted.  Denies shortness of breath on exertion.  Has been weaned to 3 L nasal cannula.  We have been consulted to  manage dialysis needs during this admission   Objective:  Vital signs in last 24 hours:  Temp:  [97.6 F (36.4 C)-99.9 F (37.7 C)] 98.3 F (36.8 C) (06/30 1220) Pulse Rate:  [84-111] 93 (06/30 1400) Resp:  [15-91] 17 (06/30 1400) BP: (130-178)/(78-104) 140/83 (06/30 1400) SpO2:  [90 %-100 %] 100 % (06/30 1400) Weight:  [99.1 kg-100.2 kg] 100.2 kg (06/30 1220)  Weight change:  Filed Weights   03/04/22 2222 03/05/22 0500 03/05/22 1220  Weight: 100.1 kg 99.1 kg 100.2 kg    Intake/Output: I/O last 3 completed shifts: In: 743.2 [P.O.:180; IV Piggyback:563.2] Out: 3000 [Other:3000]   Intake/Output this shift:  No intake/output data recorded.  Physical Exam: General: NAD, sitting at side of bed  Head: Normocephalic, atraumatic. Moist oral mucosal membranes  Eyes: Anicteric  Lungs:  Basilar crackles normal effort, [place] O2  Heart: Regular rate and rhythm  Abdomen:  Soft, nontender, obese  Extremities: No peripheral edema.  Neurologic: Nonfocal, moving all four extremities  Skin: No lesions  Access: Left aVF    Basic Metabolic Panel: Recent Labs  Lab 03/04/22 1412 03/05/22 0613  NA 135 136  K 5.5* 4.5  CL 98 97*  CO2 20* 26  GLUCOSE 100* 109*  BUN 39* 29*  CREATININE 8.30* 7.08*  CALCIUM 8.3* 8.5*    Liver Function Tests: Recent Labs  Lab 03/04/22 1412  AST 23  ALT 20  ALKPHOS 85  BILITOT 1.1  PROT 8.6*  ALBUMIN 3.8   No results for input(s): "LIPASE", "AMYLASE" in the last 168 hours. No results for input(s): "AMMONIA" in the last 168 hours.  CBC: Recent Labs  Lab 03/04/22 1412 03/05/22 0613  WBC  5.6 8.6  NEUTROABS 5.1  --   HGB 12.3* 10.6*  HCT 40.2 34.1*  MCV 87.8 86.5  PLT 181 169    Cardiac Enzymes: No results for input(s): "CKTOTAL", "CKMB", "CKMBINDEX", "TROPONINI" in the last 168 hours.  BNP: Invalid input(s): "POCBNP"  CBG: No results for input(s): "GLUCAP" in the last 168 hours.  Microbiology: Results for orders placed or  performed during the hospital encounter of 03/04/22  Blood Culture (routine x 2)     Status: None (Preliminary result)   Collection Time: 03/04/22  1:52 PM   Specimen: BLOOD  Result Value Ref Range Status   Specimen Description   Final    BLOOD RAC Performed at [facility], [address] [place], [place] [address]    Special Requests   Final    BOTTLES DRAWN AEROBIC AND ANAEROBIC BCAV Performed at [facility], [address] [place], [place] [address]    Culture  Setup Time   Final    Organism ID to follow GRAM POSITIVE COCCI IN BOTH AEROBIC AND ANAEROBIC BOTTLES CRITICAL RESULT CALLED TO, READ BACK BY AND VERIFIED WITH: JASON BELEUE $RemoveBefo'[email or web address]$  ON 03/05/22 SKL GRAM STAIN REVIEWED-AGREE WITH RESULT Performed at [facility], [place] [address], [place], [place] [address]    Culture GRAM POSITIVE COCCI  Final   Report Status PENDING  Incomplete  Blood Culture ID Panel (Reflexed)     Status: Abnormal   Collection Time: 03/04/22  1:52 PM  Result Value Ref Range Status   Enterococcus faecalis NOT DETECTED NOT DETECTED Final   Enterococcus Faecium NOT DETECTED NOT DETECTED Final   Listeria monocytogenes NOT DETECTED NOT DETECTED Final   Staphylococcus species NOT DETECTED NOT DETECTED Final   Staphylococcus aureus (BCID) NOT DETECTED NOT DETECTED Final   Staphylococcus epidermidis NOT DETECTED NOT DETECTED Final   Staphylococcus lugdunensis NOT DETECTED NOT DETECTED Final   Streptococcus species DETECTED (A) NOT DETECTED Final    Comment: CRITICAL RESULT CALLED TO, READ BACK BY AND VERIFIED WITH: JASON BELEUE $RemoveBefo'[email or web address]$  ON 03/05/22 SKL    Streptococcus agalactiae DETECTED (A) NOT DETECTED Final    Comment: CRITICAL RESULT CALLED TO, READ BACK BY AND VERIFIED WITH: JASON BELEUE $RemoveBefo'[email or web address]$  ON 03/05/22 SKL    Streptococcus pneumoniae NOT DETECTED NOT DETECTED Final   Streptococcus pyogenes NOT DETECTED NOT DETECTED Final   A.calcoaceticus-baumannii NOT DETECTED NOT DETECTED  Final   Bacteroides fragilis NOT DETECTED NOT DETECTED Final   Enterobacterales NOT DETECTED NOT DETECTED Final   Enterobacter cloacae complex NOT DETECTED NOT DETECTED Final   Escherichia coli NOT DETECTED NOT DETECTED Final   Klebsiella aerogenes NOT DETECTED NOT DETECTED Final   Klebsiella oxytoca NOT DETECTED NOT DETECTED Final   Klebsiella pneumoniae NOT DETECTED NOT DETECTED Final   Proteus species NOT DETECTED NOT DETECTED Final   Salmonella species NOT DETECTED NOT DETECTED Final   Serratia marcescens NOT DETECTED NOT DETECTED Final   Haemophilus influenzae NOT DETECTED NOT DETECTED Final   Neisseria meningitidis NOT DETECTED NOT DETECTED Final   Pseudomonas aeruginosa NOT DETECTED NOT DETECTED Final   Stenotrophomonas maltophilia NOT DETECTED NOT DETECTED Final   Candida albicans NOT DETECTED NOT DETECTED Final   Candida auris NOT DETECTED NOT DETECTED Final   Candida glabrata NOT DETECTED NOT DETECTED Final   Candida krusei NOT DETECTED NOT DETECTED Final   Candida parapsilosis NOT DETECTED NOT DETECTED Final   Candida tropicalis NOT DETECTED NOT DETECTED Final   Cryptococcus neoformans/gattii NOT DETECTED NOT DETECTED  Final    Comment: Performed at [facility], [address] [place], [place] [address]  Blood Culture (routine x 2)     Status: None (Preliminary result)   Collection Time: 03/04/22  1:57 PM   Specimen: BLOOD  Result Value Ref Range Status   Specimen Description   Final    BLOOD RIGHT FA Performed at [facility], [address], [place], [place] [address]    Special Requests   Final    BOTTLES DRAWN AEROBIC AND ANAEROBIC Performed at [facility], [address] [place], [place] [address]    Culture  Setup Time   Final    GRAM STAIN REVIEWED-AGREE WITH RESULT Performed at [facility], [place] [address], [place], [place] [address]    Culture   Final    AEROBIC BOTTLE ONLY GRAM POSITIVE COCCI CRITICAL VALUE NOTED.   VALUE IS CONSISTENT WITH PREVIOUSLY REPORTED AND CALLED VALUE. Performed at [facility], [address] [place], [place] [address]    Report Status PENDING  Incomplete  Resp Panel by RT-PCR (Flu A&B, Covid) Anterior Nasal Swab     Status: None   Collection Time: 03/04/22  2:12 PM   Specimen: Anterior Nasal Swab  Result Value Ref Range Status   SARS Coronavirus 2 by RT PCR NEGATIVE NEGATIVE Final    Comment: (NOTE) SARS-CoV-2 target nucleic acids are NOT DETECTED.  The SARS-CoV-2 RNA is generally detectable in upper respiratory specimens during the acute phase of infection. The lowest concentration of SARS-CoV-2 viral copies this assay can detect is 138 copies/mL. A negative result does not preclude SARS-Cov-2 infection and should not be used as the sole basis for treatment or other patient management decisions. A negative result may occur with  improper specimen collection/handling, submission of specimen other than nasopharyngeal swab, presence of viral mutation(s) within the areas targeted by this assay, and inadequate number of viral copies(<138 copies/mL). A negative result must be combined with clinical observations, patient history, and epidemiological information. The expected result is Negative.  Fact Sheet for Patients:  EntrepreneurPulse.com.au  Fact Sheet for Healthcare Providers:  IncredibleEmployment.be  This test is no t yet approved or cleared by the [place] FDA and  has been authorized for detection and/or diagnosis of SARS-CoV-2 by FDA under an Emergency Use Authorization (EUA). This EUA will remain  in effect (meaning this test can be used) for the duration of the COVID-19 declaration under Section 564(b)(1) of the Act, 21 U.S.C.section 360bbb-3(b)(1), unless the authorization is terminated  or revoked sooner.       Influenza A by PCR NEGATIVE NEGATIVE Final   Influenza B by PCR NEGATIVE NEGATIVE Final     Comment: (NOTE) The Xpert Xpress SARS-CoV-2/FLU/RSV plus assay is intended as an aid in the diagnosis of influenza from Nasopharyngeal swab specimens and should not be used as a sole basis for treatment. Nasal washings and aspirates are unacceptable for Xpert Xpress SARS-CoV-2/FLU/RSV testing.  Fact Sheet for Patients: EntrepreneurPulse.com.au  Fact Sheet for Healthcare Providers: IncredibleEmployment.be  This test is not yet approved or cleared by the [place] FDA and has been authorized for detection and/or diagnosis of SARS-CoV-2 by FDA under an Emergency Use Authorization (EUA). This EUA will remain in effect (meaning this test can be used) for the duration of the COVID-19 declaration under Section 564(b)(1) of the Act, 21 U.S.C. section 360bbb-3(b)(1), unless the authorization is terminated or revoked.  Performed at [facility], [place],  [place], [place] [address]     Coagulation Studies: Recent Labs    03/04/22 1412  LABPROT 14.4  INR 1.1    Urinalysis: No results for input(s): "COLORURINE", "LABSPEC", "PHURINE", "GLUCOSEU", "HGBUR", "BILIRUBINUR", "KETONESUR", "PROTEINUR", "UROBILINOGEN", "NITRITE", "LEUKOCYTESUR" in the last 72 hours.  Invalid input(s): "APPERANCEUR"    Imaging: DG Chest Port 1 View  Result Date: 03/04/2022 CLINICAL DATA:  Questionable sepsis EXAM: PORTABLE CHEST - 1 VIEW COMPARISON:  12/25/2021 FINDINGS: Low lung volumes with crowding of bronchovascular structures, some increase in scattered airspace opacities in both lung bases in the mid left lung. Stable cardiomegaly. No effusion. Orthopedic hardware in the left scapula.  Bilateral shoulder DJD. IMPRESSION: New asymmetric infiltrates or edema, left greater than right. Electronically Signed   By: Lucrezia Europe M.D.   On: 03/04/2022 14:23     Medications:    azithromycin Stopped (03/04/22 1714)    ceFAZolin (ANCEF) IV 1 g (03/05/22  0530)    amLODipine  10 mg Oral Daily   aspirin EC  81 mg Oral Daily   atorvastatin  10 mg Oral Daily   calcium acetate  667 mg Oral TID WC   carvedilol  6.25 mg Oral BID   Chlorhexidine Gluconate Cloth  6 each Topical Q0600   famotidine  20 mg Oral Daily   heparin  5,000 Units Subcutaneous Q8H   lisinopril  40 mg Oral Daily   acetaminophen, albuterol, dextromethorphan-guaiFENesin, hydrALAZINE, ondansetron (ZOFRAN) IV  Assessment/ Plan:  [name] is a 61 y.o.  male past medical history including hypertension, diastolic chronic heart failure, depression, hypertension, and end-stage renal disease on hemodialysis.  Patient presents to the emergency department from his dialysis clinic with complaints of shortness of breath and chest discomfort.  Patient has been admitted for Hypoxia [R09.02] Acute respiratory failure with hypoxia (HCC) [J96.01] Hypervolemia, unspecified hypervolemia type [E87.70] Dyspnea, unspecified type [R06.00] Community acquired pneumonia, unspecified laterality [J18.9] Congestive heart failure, unspecified HF chronicity, unspecified heart failure type ([place]) [I50.9]  [facility] TTS [place] Left AVF 107.5kg  Fluid overload with end-stage renal disease on hemodialysis.  Will maintain outpatient schedule if possible.  Aggressive dialysis attempted in outpatient clinic resulting in chest pain and shortness of breath.  Patient received urgent dialysis yesterday, UF 3 L achieved.  Will provide additional sequential only treatment today with UF goal 2 to 2.5 L as tolerated.  Patient will receive scheduled dialysis treatment tomorrow.  2. Anemia of chronic kidney disease Lab Results  Component Value Date   HGB 10.6 (L) 03/05/2022    Hemoglobin at acceptable goal.  3. Secondary Hyperparathyroidism:  Lab Results  Component Value Date   PTH 301 (H) 09/24/2020   CALCIUM 8.5 (L) 03/05/2022   CAION 1.21 06/27/2019   PHOS 6.2 (H) 08/14/2021    Calcium  within acceptable range however phosphorus elevated.  Continue calcium acetate with meals.  4.  Hypertension with chronic kidney disease.  Home regimen includes amlodipine, carvedilol, hydralazine, hydrochlorothiazide and lisinopril.  Currently receiving amlodipine, carvedilol, and lisinopril.  Blood pressure currently 140/88.   LOS: 1   6/30/20232:20 PM

## 2022-03-05 NOTE — Progress Notes (Signed)
PROGRESS NOTE    Steve Vance  VOH:[id number] DOB: [birth year] DOA: 03/04/2022 PCP: Theotis Burrow, MD  Assessment & Plan:   Principal Problem:   Acute respiratory failure with hypoxia ([place]) Active Problems:   Volume overload   Essential hypertension   ESRD on dialysis ([place])   Hyperlipidemia   Chronic diastolic CHF (congestive heart failure) (HCC)   Hyperkalemia   Diarrhea  Assessment and Plan: Acute hypoxic respiratory failure: likely secondary to fluid overload & possible pneumonia. Continue on IV abxs, bronchodilators, encourage incentive spirometry. Continue on supplemental oxygen and wean as tolerated. Volume management w/ HD   Possible pneumonia: as per CXR. Continue on IV azithromycin,cefazolin, bronchodilators & encourage incentive spirometry   Possible bacteremia: blood cxs growing strep agalactiae. Continue on IV cefazolin. ID consulted    Volume overload: will need HD    HTN: continue on amlodipine, coreg, lisinopril. IV hydralazine prn    ESRD: on HD. Management as per nephro   ACD: likely secondary to ESRD. No need for a transfusion currently   Diarrhea: GI PCR panel & c. diff are ordered   Hyperkalemia: resolved    Chronic diastolic CHF: echo on 69/48/5462 showed EF of 55 to 60%.  Now has fluid overload. Volume management w/ HD    HLD: continue on statin   Obesity: BMI 34.6. Complicates overall care & prognosis    DVT prophylaxis: heparin  Code Status: full  Family Communication:  Disposition Plan: likely d/c back home   Level of care: Progressive  Status is: Inpatient Remains inpatient appropriate because: severity of illness  Consultants:  ID  Procedures:   Antimicrobials: cefazolin, azithromycin    Subjective: Pt c/o malaise   Objective: Vitals:   03/04/22 2200 03/04/22 2222 03/05/22 0400 03/05/22 0500  BP: (!) 177/100 (!) 176/97    Pulse: (!) 106 (!) 105    Resp: (!) 26 20 (!) 21   Temp:  97.6 F (36.4 C)     TempSrc:  Oral    SpO2:  96% 98%   Weight:  100.1 kg  99.1 kg  Height:  $Remove'5\' 7"'iebOprC$  (1.702 m)      Intake/Output Summary (Last 24 hours) at 03/05/2022 0759 Last data filed at 03/05/2022 0600 Gross per 24 hour  Intake 743.15 ml  Output 3000 ml  Net -2256.85 ml   Filed Weights   03/04/22 1341 03/04/22 2222 03/05/22 0500  Weight: 97.1 kg 100.1 kg 99.1 kg    Examination:  General exam: Appears calm and comfortable  Respiratory system: decreased breath sounds b/l  Cardiovascular system: S1 & S2 +. No rubs, gallops or clicks.  Gastrointestinal system: Abdomen is obese, soft and nontender. Normal bowel sounds heard. Central nervous system: Alert and oriented. Moves all extremities  Psychiatry: Judgement and insight appear normal. Mood & affect appropriate.     Data Reviewed: I have personally reviewed following labs and imaging studies  CBC: Recent Labs  Lab 03/04/22 1412 03/05/22 0613  WBC 5.6 8.6  NEUTROABS 5.1  --   HGB 12.3* 10.6*  HCT 40.2 34.1*  MCV 87.8 86.5  PLT 181 703   Basic Metabolic Panel: Recent Labs  Lab 03/04/22 1412 03/05/22 0613  NA 135 136  K 5.5* 4.5  CL 98 97*  CO2 20* 26  GLUCOSE 100* 109*  BUN 39* 29*  CREATININE 8.30* 7.08*  CALCIUM 8.3* 8.5*   GFR: Estimated Creatinine Clearance: 12.1 mL/min (A) (by C-G formula based on SCr of 7.08 mg/dL (H)). Liver Function Tests:  Recent Labs  Lab 03/04/22 1412  AST 23  ALT 20  ALKPHOS 85  BILITOT 1.1  PROT 8.6*  ALBUMIN 3.8   No results for input(s): "LIPASE", "AMYLASE" in the last 168 hours. No results for input(s): "AMMONIA" in the last 168 hours. Coagulation Profile: Recent Labs  Lab 03/04/22 1412  INR 1.1   Cardiac Enzymes: No results for input(s): "CKTOTAL", "CKMB", "CKMBINDEX", "TROPONINI" in the last 168 hours. BNP (last 3 results) No results for input(s): "PROBNP" in the last 8760 hours. HbA1C: No results for input(s): "HGBA1C" in the last 72 hours. CBG: No results for  input(s): "GLUCAP" in the last 168 hours. Lipid Profile: No results for input(s): "CHOL", "HDL", "LDLCALC", "TRIG", "CHOLHDL", "LDLDIRECT" in the last 72 hours. Thyroid Function Tests: No results for input(s): "TSH", "T4TOTAL", "FREET4", "T3FREE", "THYROIDAB" in the last 72 hours. Anemia Panel: No results for input(s): "VITAMINB12", "FOLATE", "FERRITIN", "TIBC", "IRON", "RETICCTPCT" in the last 72 hours. Sepsis Labs: Recent Labs  Lab 03/04/22 1412  PROCALCITON 0.34  LATICACIDVEN 1.8    Recent Results (from the past 240 hour(s))  Blood Culture (routine x 2)     Status: None (Preliminary result)   Collection Time: 03/04/22  1:52 PM   Specimen: BLOOD  Result Value Ref Range Status   Specimen Description   Final    BLOOD RAC Performed at [facility], [address] [place], [place] [address]    Special Requests   Final    BOTTLES DRAWN AEROBIC AND ANAEROBIC BCAV Performed at [facility], [address] [place], [place] [address]    Culture  Setup Time   Final    Organism ID to follow GRAM POSITIVE COCCI IN BOTH AEROBIC AND ANAEROBIC BOTTLES CRITICAL RESULT CALLED TO, READ BACK BY AND VERIFIED WITH: JASON BELEUE $RemoveBefo'[email or web address]$  ON 03/05/22 SKL GRAM STAIN REVIEWED-AGREE WITH RESULT Performed at [facility], [place] [address], [place], [place] [address]    Culture GRAM POSITIVE COCCI  Final   Report Status PENDING  Incomplete  Blood Culture ID Panel (Reflexed)     Status: Abnormal   Collection Time: 03/04/22  1:52 PM  Result Value Ref Range Status   Enterococcus faecalis NOT DETECTED NOT DETECTED Final   Enterococcus Faecium NOT DETECTED NOT DETECTED Final   Listeria monocytogenes NOT DETECTED NOT DETECTED Final   Staphylococcus species NOT DETECTED NOT DETECTED Final   Staphylococcus aureus (BCID) NOT DETECTED NOT DETECTED Final   Staphylococcus epidermidis NOT DETECTED NOT DETECTED Final   Staphylococcus lugdunensis NOT DETECTED NOT DETECTED Final    Streptococcus species DETECTED (A) NOT DETECTED Final    Comment: CRITICAL RESULT CALLED TO, READ BACK BY AND VERIFIED WITH: JASON BELEUE $RemoveBefo'[email or web address]$  ON 03/05/22 SKL    Streptococcus agalactiae DETECTED (A) NOT DETECTED Final    Comment: CRITICAL RESULT CALLED TO, READ BACK BY AND VERIFIED WITH: JASON BELEUE $RemoveBefo'[email or web address]$  ON 03/05/22 SKL    Streptococcus pneumoniae NOT DETECTED NOT DETECTED Final   Streptococcus pyogenes NOT DETECTED NOT DETECTED Final   A.calcoaceticus-baumannii NOT DETECTED NOT DETECTED Final   Bacteroides fragilis NOT DETECTED NOT DETECTED Final   Enterobacterales NOT DETECTED NOT DETECTED Final   Enterobacter cloacae complex NOT DETECTED NOT DETECTED Final   Escherichia coli NOT DETECTED NOT DETECTED Final   Klebsiella aerogenes NOT DETECTED NOT DETECTED Final   Klebsiella oxytoca NOT DETECTED NOT DETECTED Final   Klebsiella pneumoniae NOT DETECTED NOT DETECTED Final   Proteus species NOT DETECTED NOT DETECTED Final   Salmonella  species NOT DETECTED NOT DETECTED Final   Serratia marcescens NOT DETECTED NOT DETECTED Final   Haemophilus influenzae NOT DETECTED NOT DETECTED Final   Neisseria meningitidis NOT DETECTED NOT DETECTED Final   Pseudomonas aeruginosa NOT DETECTED NOT DETECTED Final   Stenotrophomonas maltophilia NOT DETECTED NOT DETECTED Final   Candida albicans NOT DETECTED NOT DETECTED Final   Candida auris NOT DETECTED NOT DETECTED Final   Candida glabrata NOT DETECTED NOT DETECTED Final   Candida krusei NOT DETECTED NOT DETECTED Final   Candida parapsilosis NOT DETECTED NOT DETECTED Final   Candida tropicalis NOT DETECTED NOT DETECTED Final   Cryptococcus neoformans/gattii NOT DETECTED NOT DETECTED Final    Comment: Performed at [facility], [address] [place], [place] [address]  Blood Culture (routine x 2)     Status: None (Preliminary result)   Collection Time: 03/04/22  1:57 PM   Specimen: BLOOD  Result Value Ref Range Status   Specimen  Description   Final    BLOOD RIGHT FA Performed at [facility], [address] [place], [place] [address]    Special Requests   Final    BOTTLES DRAWN AEROBIC AND ANAEROBIC Performed at [facility], [address], [place], [place] [address]    Culture  Setup Time   Final    GRAM STAIN REVIEWED-AGREE WITH RESULT Performed at [facility], [place] [address], [place], [place] [address]    Culture   Final    AEROBIC BOTTLE ONLY GRAM POSITIVE COCCI CRITICAL VALUE NOTED.  VALUE IS CONSISTENT WITH PREVIOUSLY REPORTED AND CALLED VALUE. Performed at [facility], [address] [place], [place] [address]    Report Status PENDING  Incomplete  Resp Panel by RT-PCR (Flu A&B, Covid) Anterior Nasal Swab     Status: None   Collection Time: 03/04/22  2:12 PM   Specimen: Anterior Nasal Swab  Result Value Ref Range Status   SARS Coronavirus 2 by RT PCR NEGATIVE NEGATIVE Final    Comment: (NOTE) SARS-CoV-2 target nucleic acids are NOT DETECTED.  The SARS-CoV-2 RNA is generally detectable in upper respiratory specimens during the acute phase of infection. The lowest concentration of SARS-CoV-2 viral copies this assay can detect is 138 copies/mL. A negative result does not preclude SARS-Cov-2 infection and should not be used as the sole basis for treatment or other patient management decisions. A negative result may occur with  improper specimen collection/handling, submission of specimen other than nasopharyngeal swab, presence of viral mutation(s) within the areas targeted by this assay, and inadequate number of viral copies(<138 copies/mL). A negative result must be combined with clinical observations, patient history, and epidemiological information. The expected result is Negative.  Fact Sheet for Patients:  EntrepreneurPulse.com.au  Fact Sheet for Healthcare Providers:  IncredibleEmployment.be  This test is no t  yet approved or cleared by the [place] FDA and  has been authorized for detection and/or diagnosis of SARS-CoV-2 by FDA under an Emergency Use Authorization (EUA). This EUA will remain  in effect (meaning this test can be used) for the duration of the COVID-19 declaration under Section 564(b)(1) of the Act, 21 U.S.C.section 360bbb-3(b)(1), unless the authorization is terminated  or revoked sooner.       Influenza A by PCR NEGATIVE NEGATIVE Final   Influenza B by PCR NEGATIVE NEGATIVE Final    Comment: (NOTE) The Xpert Xpress SARS-CoV-2/FLU/RSV plus assay is intended as an aid in the diagnosis of influenza from Nasopharyngeal swab specimens  and should not be used as a sole basis for treatment. Nasal washings and aspirates are unacceptable for Xpert Xpress SARS-CoV-2/FLU/RSV testing.  Fact Sheet for Patients: EntrepreneurPulse.com.au  Fact Sheet for Healthcare Providers: IncredibleEmployment.be  This test is not yet approved or cleared by the [place] FDA and has been authorized for detection and/or diagnosis of SARS-CoV-2 by FDA under an Emergency Use Authorization (EUA). This EUA will remain in effect (meaning this test can be used) for the duration of the COVID-19 declaration under Section 564(b)(1) of the Act, 21 U.S.C. section 360bbb-3(b)(1), unless the authorization is terminated or revoked.  Performed at [facility], [address] [place], [place] [address]          Radiology Studies: DG Chest [place] 1 View  Result Date: 03/04/2022 CLINICAL DATA:  Questionable sepsis EXAM: PORTABLE CHEST - 1 VIEW COMPARISON:  12/25/2021 FINDINGS: Low lung volumes with crowding of bronchovascular structures, some increase in scattered airspace opacities in both lung bases in the mid left lung. Stable cardiomegaly. No effusion. Orthopedic hardware in the left scapula.  Bilateral shoulder DJD. IMPRESSION: New asymmetric  infiltrates or edema, left greater than right. Electronically Signed   By: Lucrezia Europe M.D.   On: 03/04/2022 14:23        Scheduled Meds:  amLODipine  10 mg Oral Daily   aspirin EC  81 mg Oral Daily   atorvastatin  10 mg Oral Daily   calcium acetate  667 mg Oral TID WC   carvedilol  6.25 mg Oral BID   Chlorhexidine Gluconate Cloth  6 each Topical Q0600   famotidine  20 mg Oral Daily   heparin  5,000 Units Subcutaneous Q8H   ipratropium-albuterol  3 mL Nebulization Q4H   lisinopril  40 mg Oral Daily   Continuous Infusions:  azithromycin Stopped (03/04/22 1714)    ceFAZolin (ANCEF) IV 1 g (03/05/22 0530)     LOS: 1 day    Time spent: 35 mins     Wyvonnia Dusky, MD [facility] Pager [phone]  If 7PM-7AM, please contact night-coverage [email or web address] 03/05/2022, 7:59 AM
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
## 2022-03-05 NOTE — Progress Notes (Addendum)
Resumed Hemodialysis patient known at [facility] TTS 10:45am. Education on how to reduce fluid and sodium was presented.

## 2022-03-06 DIAGNOSIS — Z992 Dependence on renal dialysis: Secondary | ICD-10-CM | POA: Diagnosis not present

## 2022-03-06 DIAGNOSIS — E669 Obesity, unspecified: Secondary | ICD-10-CM | POA: Diagnosis not present

## 2022-03-06 DIAGNOSIS — I10 Essential (primary) hypertension: Secondary | ICD-10-CM | POA: Diagnosis not present

## 2022-03-06 DIAGNOSIS — N186 End stage renal disease: Secondary | ICD-10-CM | POA: Diagnosis not present

## 2022-03-06 LAB — HEPATITIS B SURFACE ANTIBODY, QUANTITATIVE: Hep B S AB Quant (Post): <3.1 m[IU]/mL — ABNORMAL LOW (ref >9.9)

## 2022-03-06 LAB — STREP PNEUMONIAE URINARY ANTIGEN: Strep Pneumo Urinary Antigen: NEGATIVE

## 2022-03-06 NOTE — Progress Notes (Signed)
Hemodialysis Post Treatment Note  March 06, 2022  Access: LUA AVF   UF Removed:  2074ml  BFR:  350  Next Scheduled Treatment: 03/09/22  Note: Patient presents to treatment without new concerns, LUA AVF cannulates with ease, notable aneurysm present. Patient prescribed to run at a BFR of 400, unable to reach, due to elevated venous pressure, reduced to 350. Targeted UF achieved with 2+ fluid removal. Patient with extended bleeding post treatment > 30 minutes. Patient is stable to returned to newly assigned room 259, report given to Nurse Wynetta Emery.

## 2022-03-06 NOTE — Progress Notes (Signed)
[facility]  ROUNDING NOTE   Subjective:   Steve Vance is a 61 male with past medical history including hypertension, diastolic chronic heart failure, depression, hypertension, and end-stage renal disease on hemodialysis.  Patient presents to the emergency department from his dialysis clinic with complaints of shortness of breath and chest discomfort.  Patient has been admitted for Hypoxia [R09.02] Acute respiratory failure with hypoxia (HCC) [J96.01] Hypervolemia, unspecified hypervolemia type [E87.70] Dyspnea, unspecified type [R06.00] Community acquired pneumonia, unspecified laterality [J18.9] Congestive heart failure, unspecified HF chronicity, unspecified heart failure type ([place]) [I50.9]  Patient is known to our practice and receives outpatient dialysis treatments at [facility] on a TTS schedule, supervised by Dr. Candiss Norse.  Patient had presented to the outpatient clinic about 10 pounds over his dry weight.  Outpatient clinic attempted to remove 4 L of fluid and patient had been in treatment for 1 hour before he began complaining of shortness of breath and chest discomfort.  Treatment terminated at that time and patient sent to emergency department for further evaluation.  Patient presented to the emergency department 5 L nasal cannula with productive cough.  Patient normally is on room air at baseline.  Denies chest discomfort on ED arrival.  Low-grade temp noted by EMS, 100 F.  Also reported several days of diarrhea, nausea, and dry heaves.  Denies abdominal pain.  Update:  Patient feeling better today. Undergoing treatment for bacteremia. Appreciate ID input. Needs to be evaluated for endocarditis.  Objective:  Vital signs in last 24 hours:  Temp:  [97.9 F (36.6 C)-98.6 F (37 C)] 98 F (36.7 C) (07/01 0747) Pulse Rate:  [74-99] 91 (07/01 1141) Resp:  [14-23] 17 (07/01 1141) BP: (130-160)/(78-97) 137/78 (07/01 1141) SpO2:  [97 %-100 %] 99 % (07/01  1141) Weight:  [97.6 kg-100.2 kg] 100 kg (07/01 0518)  Weight change: 3.13 kg Filed Weights   03/05/22 1220 03/05/22 1520 03/06/22 0518  Weight: 100.2 kg 97.6 kg 100 kg    Intake/Output: I/O last 3 completed shifts: In: 229.2 [P.O.:180; IV Piggyback:49.2] Out: 6001 [Other:6001]   Intake/Output this shift:  No intake/output data recorded.  Physical Exam: General: NAD  Head: Normocephalic, atraumatic. Moist oral mucosal membranes  Eyes: Anicteric  Lungs:  Clear bilateral, normal effort  Heart: Regular rate and rhythm  Abdomen:  Soft, nontender, obese  Extremities: No peripheral edema.  Neurologic: Nonfocal, moving all four extremities  Skin: No lesions  Access: Left aVF    Basic Metabolic Panel: Recent Labs  Lab 03/04/22 1412 03/05/22 0613  NA 135 136  K 5.5* 4.5  CL 98 97*  CO2 20* 26  GLUCOSE 100* 109*  BUN 39* 29*  CREATININE 8.30* 7.08*  CALCIUM 8.3* 8.5*     Liver Function Tests: Recent Labs  Lab 03/04/22 1412  AST 23  ALT 20  ALKPHOS 85  BILITOT 1.1  PROT 8.6*  ALBUMIN 3.8    No results for input(s): "LIPASE", "AMYLASE" in the last 168 hours. No results for input(s): "AMMONIA" in the last 168 hours.  CBC: Recent Labs  Lab 03/04/22 1412 03/05/22 0613  WBC 5.6 8.6  NEUTROABS 5.1  --   HGB 12.3* 10.6*  HCT 40.2 34.1*  MCV 87.8 86.5  PLT 181 169     Cardiac Enzymes: No results for input(s): "CKTOTAL", "CKMB", "CKMBINDEX", "TROPONINI" in the last 168 hours.  BNP: Invalid input(s): "POCBNP"  CBG: No results for input(s): "GLUCAP" in the last 168 hours.  Microbiology: Results for orders placed  or performed during the hospital encounter of 03/04/22  Blood Culture (routine x 2)     Status: Abnormal (Preliminary result)   Collection Time: 03/04/22  1:52 PM   Specimen: BLOOD  Result Value Ref Range Status   Specimen Description   Final    BLOOD RAC Performed at [facility], [address], [place], [place] [address]     Special Requests   Final    BOTTLES DRAWN AEROBIC AND ANAEROBIC BCAV Performed at [facility], [address] [place], [place] [address]    Culture  Setup Time   Final    Organism ID to follow GRAM POSITIVE COCCI IN BOTH AEROBIC AND ANAEROBIC BOTTLES CRITICAL RESULT CALLED TO, READ BACK BY AND VERIFIED WITH: JASON BELEUE $RemoveBefo'[email or web address]$  ON 03/05/22 SKL GRAM STAIN REVIEWED-AGREE WITH RESULT Performed at [facility], [place] [address], [place], [place] [address]    Culture GROUP B STREP(S.AGALACTIAE)ISOLATED (A)  Final   Report Status PENDING  Incomplete  Blood Culture ID Panel (Reflexed)     Status: Abnormal   Collection Time: 03/04/22  1:52 PM  Result Value Ref Range Status   Enterococcus faecalis NOT DETECTED NOT DETECTED Final   Enterococcus Faecium NOT DETECTED NOT DETECTED Final   Listeria monocytogenes NOT DETECTED NOT DETECTED Final   Staphylococcus species NOT DETECTED NOT DETECTED Final   Staphylococcus aureus (BCID) NOT DETECTED NOT DETECTED Final   Staphylococcus epidermidis NOT DETECTED NOT DETECTED Final   Staphylococcus lugdunensis NOT DETECTED NOT DETECTED Final   Streptococcus species DETECTED (A) NOT DETECTED Final    Comment: CRITICAL RESULT CALLED TO, READ BACK BY AND VERIFIED WITH: JASON BELEUE $RemoveBefo'[email or web address]$  ON 03/05/22 SKL    Streptococcus agalactiae DETECTED (A) NOT DETECTED Final    Comment: CRITICAL RESULT CALLED TO, READ BACK BY AND VERIFIED WITH: JASON BELEUE $RemoveBefo'[email or web address]$  ON 03/05/22 SKL    Streptococcus pneumoniae NOT DETECTED NOT DETECTED Final   Streptococcus pyogenes NOT DETECTED NOT DETECTED Final   A.calcoaceticus-baumannii NOT DETECTED NOT DETECTED Final   Bacteroides fragilis NOT DETECTED NOT DETECTED Final   Enterobacterales NOT DETECTED NOT DETECTED Final   Enterobacter cloacae complex NOT DETECTED NOT DETECTED Final   Escherichia coli NOT DETECTED NOT DETECTED Final   Klebsiella aerogenes NOT DETECTED NOT DETECTED Final   Klebsiella oxytoca NOT DETECTED  NOT DETECTED Final   Klebsiella pneumoniae NOT DETECTED NOT DETECTED Final   Proteus species NOT DETECTED NOT DETECTED Final   Salmonella species NOT DETECTED NOT DETECTED Final   Serratia marcescens NOT DETECTED NOT DETECTED Final   Haemophilus influenzae NOT DETECTED NOT DETECTED Final   Neisseria meningitidis NOT DETECTED NOT DETECTED Final   Pseudomonas aeruginosa NOT DETECTED NOT DETECTED Final   Stenotrophomonas maltophilia NOT DETECTED NOT DETECTED Final   Candida albicans NOT DETECTED NOT DETECTED Final   Candida auris NOT DETECTED NOT DETECTED Final   Candida glabrata NOT DETECTED NOT DETECTED Final   Candida krusei NOT DETECTED NOT DETECTED Final   Candida parapsilosis NOT DETECTED NOT DETECTED Final   Candida tropicalis NOT DETECTED NOT DETECTED Final   Cryptococcus neoformans/gattii NOT DETECTED NOT DETECTED Final    Comment: Performed at [facility], [address] [place], [place] [address]  Blood Culture (routine x 2)     Status: None (Preliminary result)   Collection Time: 03/04/22  1:57 PM   Specimen: BLOOD  Result Value Ref Range Status   Specimen Description   Final    BLOOD BLOOD RIGHT FOREARM Performed  at [facility], [place] [address] [place], [place] [address]    Special Requests   Final    BOTTLES DRAWN AEROBIC AND ANAEROBIC Blood Culture results may not be optimal due to an inadequate volume of blood received in culture bottles Performed at [place] [address] [place], [place] [address]    Culture  Setup Time   Final    GRAM POSITIVE COCCI IN BOTH AEROBIC AND ANAEROBIC BOTTLES CRITICAL VALUE NOTED.  VALUE IS CONSISTENT WITH PREVIOUSLY REPORTED AND CALLED VALUE. Performed at [facility], [place] [address], [place], [place] [address]    Culture GRAM POSITIVE COCCI  Final   Report Status PENDING  Incomplete  Resp Panel by RT-PCR (Flu A&B, Covid) Anterior Nasal Swab     Status: None   Collection Time: 03/04/22  2:12 PM    Specimen: Anterior Nasal Swab  Result Value Ref Range Status   SARS Coronavirus 2 by RT PCR NEGATIVE NEGATIVE Final    Comment: (NOTE) SARS-CoV-2 target nucleic acids are NOT DETECTED.  The SARS-CoV-2 RNA is generally detectable in upper respiratory specimens during the acute phase of infection. The lowest concentration of SARS-CoV-2 viral copies this assay can detect is 138 copies/mL. A negative result does not preclude SARS-Cov-2 infection and should not be used as the sole basis for treatment or other patient management decisions. A negative result may occur with  improper specimen collection/handling, submission of specimen other than nasopharyngeal swab, presence of viral mutation(s) within the areas targeted by this assay, and inadequate number of viral copies(<138 copies/mL). A negative result must be combined with clinical observations, patient history, and epidemiological information. The expected result is Negative.  Fact Sheet for Patients:  EntrepreneurPulse.com.au  Fact Sheet for Healthcare Providers:  IncredibleEmployment.be  This test is no t yet approved or cleared by the [place] FDA and  has been authorized for detection and/or diagnosis of SARS-CoV-2 by FDA under an Emergency Use Authorization (EUA). This EUA will remain  in effect (meaning this test can be used) for the duration of the COVID-19 declaration under Section 564(b)(1) of the Act, 21 U.S.C.section 360bbb-3(b)(1), unless the authorization is terminated  or revoked sooner.       Influenza A by PCR NEGATIVE NEGATIVE Final   Influenza B by PCR NEGATIVE NEGATIVE Final    Comment: (NOTE) The Xpert Xpress SARS-CoV-2/FLU/RSV plus assay is intended as an aid in the diagnosis of influenza from Nasopharyngeal swab specimens and should not be used as a sole basis for treatment. Nasal washings and aspirates are unacceptable for Xpert Xpress  SARS-CoV-2/FLU/RSV testing.  Fact Sheet for Patients: EntrepreneurPulse.com.au  Fact Sheet for Healthcare Providers: IncredibleEmployment.be  This test is not yet approved or cleared by the [place] FDA and has been authorized for detection and/or diagnosis of SARS-CoV-2 by FDA under an Emergency Use Authorization (EUA). This EUA will remain in effect (meaning this test can be used) for the duration of the COVID-19 declaration under Section 564(b)(1) of the Act, 21 U.S.C. section 360bbb-3(b)(1), unless the authorization is terminated or revoked.  Performed at [facility], [place], [place] [address]   C Difficile Quick Screen w PCR reflex     Status: None   Collection Time: 03/04/22  9:21 PM   Specimen: Stool  Result Value Ref Range Status   C Diff antigen NEGATIVE NEGATIVE Final   C Diff toxin NEGATIVE NEGATIVE Final   C Diff interpretation No C.  difficile detected.  Final    Comment: Performed at [facility], [address] [place], [place] [address]  Gastrointestinal Panel by PCR , Stool     Status: None   Collection Time: 03/04/22  9:21 PM   Specimen: Stool  Result Value Ref Range Status   Campylobacter species NOT DETECTED NOT DETECTED Final   Plesimonas shigelloides NOT DETECTED NOT DETECTED Final   Salmonella species NOT DETECTED NOT DETECTED Final   Yersinia enterocolitica NOT DETECTED NOT DETECTED Final   Vibrio species NOT DETECTED NOT DETECTED Final   Vibrio cholerae NOT DETECTED NOT DETECTED Final   Enteroaggregative E coli (EAEC) NOT DETECTED NOT DETECTED Final   Enteropathogenic E coli (EPEC) NOT DETECTED NOT DETECTED Final   Enterotoxigenic E coli (ETEC) NOT DETECTED NOT DETECTED Final   Shiga like toxin producing E coli (STEC) NOT DETECTED NOT DETECTED Final   Shigella/Enteroinvasive E coli (EIEC) NOT DETECTED NOT DETECTED Final   Cryptosporidium NOT DETECTED NOT DETECTED Final    Cyclospora cayetanensis NOT DETECTED NOT DETECTED Final   Entamoeba histolytica NOT DETECTED NOT DETECTED Final   Giardia lamblia NOT DETECTED NOT DETECTED Final   Adenovirus F40/41 NOT DETECTED NOT DETECTED Final   Astrovirus NOT DETECTED NOT DETECTED Final   Norovirus GI/GII NOT DETECTED NOT DETECTED Final   Rotavirus A NOT DETECTED NOT DETECTED Final   Sapovirus (I, II, IV, and V) NOT DETECTED NOT DETECTED Final    Comment: Performed at [facility], [address] [place], [place] [address]  Culture, blood (Routine X 2) w Reflex to ID Panel     Status: None (Preliminary result)   Collection Time: 03/06/22  4:50 AM   Specimen: BLOOD  Result Value Ref Range Status   Specimen Description BLOOD RIGHT ANTECUBITAL  Final   Special Requests   Final    BOTTLES DRAWN AEROBIC AND ANAEROBIC Blood Culture adequate volume   Culture   Final    NO GROWTH < 12 HOURS Performed at [facility], [address] [place], [place] [address]    Report Status PENDING  Incomplete  Culture, blood (Routine X 2) w Reflex to ID Panel     Status: None (Preliminary result)   Collection Time: 03/06/22  4:50 AM   Specimen: BLOOD  Result Value Ref Range Status   Specimen Description BLOOD BLOOD RIGHT WRIST  Final   Special Requests   Final    BOTTLES DRAWN AEROBIC AND ANAEROBIC Blood Culture adequate volume   Culture   Final    NO GROWTH < 12 HOURS Performed at [facility], [address] [place], [place] [address]    Report Status PENDING  Incomplete    Coagulation Studies: Recent Labs    03/04/22 1412  LABPROT 14.4  INR 1.1     Urinalysis: Recent Labs    03/04/22 2139  COLORURINE YELLOW*  LABSPEC 1.016  PHURINE 7.0  GLUCOSEU 50*  HGBUR MODERATE*  BILIRUBINUR NEGATIVE  KETONESUR NEGATIVE  PROTEINUR >=300*  NITRITE NEGATIVE  LEUKOCYTESUR MODERATE*      Imaging: DG Chest Port 1 View  Result Date: 03/04/2022 CLINICAL DATA:  Questionable sepsis EXAM:  PORTABLE CHEST - 1 VIEW COMPARISON:  12/25/2021 FINDINGS: Low lung volumes with crowding of bronchovascular structures, some increase in scattered airspace opacities in both lung bases in the mid left lung. Stable cardiomegaly. No effusion. Orthopedic hardware in the left scapula.  Bilateral shoulder DJD. IMPRESSION: New asymmetric infiltrates or edema, left greater than right.  Electronically Signed   By: Lucrezia Europe M.D.   On: 03/04/2022 14:23     Medications:     ceFAZolin (ANCEF) IV Stopped (03/05/22 2129)    amLODipine  10 mg Oral Daily   aspirin EC  81 mg Oral Daily   atorvastatin  10 mg Oral Daily   calcium acetate  667 mg Oral TID WC   carvedilol  6.25 mg Oral BID   Chlorhexidine Gluconate Cloth  6 each Topical Q0600   famotidine  20 mg Oral Daily   heparin  5,000 Units Subcutaneous Q8H   lisinopril  40 mg Oral Daily   acetaminophen, albuterol, dextromethorphan-guaiFENesin, hydrALAZINE, ondansetron (ZOFRAN) IV  Assessment/ Plan:  [name] is a 61 y.o.  male past medical history including hypertension, diastolic chronic heart failure, depression, hypertension, and end-stage renal disease on hemodialysis.  Patient presents to the emergency department from his dialysis clinic with complaints of shortness of breath and chest discomfort.  Patient has been admitted for Hypoxia [R09.02] Acute respiratory failure with hypoxia (HCC) [J96.01] Hypervolemia, unspecified hypervolemia type [E87.70] Dyspnea, unspecified type [R06.00] Community acquired pneumonia, unspecified laterality [J18.9] Congestive heart failure, unspecified HF chronicity, unspecified heart failure type ([place]) [I50.9]  [facility] TTS [place] Left AVF 107.5kg  Fluid overload with end-stage renal disease on hemodialysis.  Patient underwent hemodialysis treatment yesterday to get additional fluid off.  We will plan for hemodialysis treatment again today as well.  2. Anemia of chronic kidney disease Lab  Results  Component Value Date   HGB 10.6 (L) 03/05/2022    Hemoglobin at acceptable goal.  Continue to [facility] to monitor.  3. Secondary Hyperparathyroidism:  Lab Results  Component Value Date   PTH 301 (H) 09/24/2020   CALCIUM 8.5 (L) 03/05/2022   CAION 1.21 06/27/2019   PHOS 6.2 (H) 08/14/2021    Maintain the patient on calcium acetate and continue periodically monitor bone metabolism parameters.  4.  Hypertension with chronic kidney disease.  Home regimen includes amlodipine, carvedilol, hydralazine, hydrochlorothiazide and lisinopril.  Currently receiving amlodipine, carvedilol, and lisinopril.  Blood pressure currently 140/88.   LOS: 2 Keiara Sneeringer 7/1/202312:02 PM

## 2022-03-06 NOTE — Progress Notes (Addendum)
PROGRESS NOTE    Steve Vance  NOI:[id number] DOB: 10/03/1960 DOA: 03/04/2022 PCP: Theotis Burrow, MD  Assessment & Plan:   Principal Problem:   Acute respiratory failure with hypoxia ([place]) Active Problems:   Volume overload   Essential hypertension   ESRD on dialysis ([place])   Hyperlipidemia   Chronic diastolic CHF (congestive heart failure) ([facility])   Hyperkalemia   Diarrhea  Assessment and Plan: Acute hypoxic respiratory failure: likely secondary to fluid overload & possible pneumonia.  Continue on IV cefazolin, bronchodilators, encourage incentive spirometry. Volume management w/ HD. Continue on supplemental oxygen & wean as tolerated  Possible pneumonia: as per CXR. Continue on IV cefazolin, bronchodilators & encourage incentive spirometry   Bacteremia: blood cxs growing strep agalactiae. Repeat blood cxs NGTD. Echo ordered. Continue on IV cefazolin    Volume overload: improved w/ HD   HTN: continue on amlodipine, coreg, lisinopril. IV hydralazine prn    ESRD: on HD. Management as per nephro    ACD: likely secondary to ESRD. No need for a transfusion currently    Diarrhea: c. diff & GI PCR panel were both neg    Hyperkalemia: resolved    Chronic diastolic CHF: echo on 69/45/0388 showed EF of 55 to 60%.  Now has fluid overload. Volume management w/ HD    HLD: continue on statin    Obesity: BMI 34.6. Complicates overall care & prognosis     DVT prophylaxis: heparin  Code Status: full  Family Communication:  Disposition Plan: likely d/c back home   Level of care: Progressive  Status is: Inpatient Remains inpatient appropriate because: severity of illness  Consultants:  ID  Procedures:   Antimicrobials: cefazolin   Subjective: Pt c/o fatigue  Objective: Vitals:   03/05/22 2338 03/06/22 0510 03/06/22 0518 03/06/22 0747  BP: 138/81 (!) 156/94  (!) 152/82  Pulse: 97 94  74  Resp: $Remo'18 18  17  'XezIY$ Temp: 98.5 F (36.9 C) 97.9 F (36.6 C)  98 F  (36.7 C)  TempSrc:      SpO2: 100% 97%  98%  Weight:   100 kg   Height:        Intake/Output Summary (Last 24 hours) at 03/06/2022 0803 Last data filed at 03/05/2022 1520 Gross per 24 hour  Intake --  Output 3001 ml  Net -3001 ml   Filed Weights   03/05/22 1220 03/05/22 1520 03/06/22 0518  Weight: 100.2 kg 97.6 kg 100 kg    Examination:  General exam: Appears comfortable  Respiratory system: diminished breath sounds b/l  Cardiovascular system: S1/S2+. No rubs or clicks   Gastrointestinal system: Abd is soft, NT, obese & normal bowel sounds  Central nervous system: alert and oriented. Moves all extremities   Psychiatry: Judgement and insight appears normal. Appropriate mood and affect.     Data Reviewed: I have personally reviewed following labs and imaging studies  CBC: Recent Labs  Lab 03/04/22 1412 03/05/22 0613  WBC 5.6 8.6  NEUTROABS 5.1  --   HGB 12.3* 10.6*  HCT 40.2 34.1*  MCV 87.8 86.5  PLT 181 828   Basic Metabolic Panel: Recent Labs  Lab 03/04/22 1412 03/05/22 0613  NA 135 136  K 5.5* 4.5  CL 98 97*  CO2 20* 26  GLUCOSE 100* 109*  BUN 39* 29*  CREATININE 8.30* 7.08*  CALCIUM 8.3* 8.5*   GFR: Estimated Creatinine Clearance: 12.2 mL/min (A) (by C-G formula based on SCr of 7.08 mg/dL (H)). Liver Function Tests: Recent Labs  Lab 03/04/22 1412  AST 23  ALT 20  ALKPHOS 85  BILITOT 1.1  PROT 8.6*  ALBUMIN 3.8   No results for input(s): "LIPASE", "AMYLASE" in the last 168 hours. No results for input(s): "AMMONIA" in the last 168 hours. Coagulation Profile: Recent Labs  Lab 03/04/22 1412  INR 1.1   Cardiac Enzymes: No results for input(s): "CKTOTAL", "CKMB", "CKMBINDEX", "TROPONINI" in the last 168 hours. BNP (last 3 results) No results for input(s): "PROBNP" in the last 8760 hours. HbA1C: No results for input(s): "HGBA1C" in the last 72 hours. CBG: No results for input(s): "GLUCAP" in the last 168 hours. Lipid Profile: No results  for input(s): "CHOL", "HDL", "LDLCALC", "TRIG", "CHOLHDL", "LDLDIRECT" in the last 72 hours. Thyroid Function Tests: No results for input(s): "TSH", "T4TOTAL", "FREET4", "T3FREE", "THYROIDAB" in the last 72 hours. Anemia Panel: No results for input(s): "VITAMINB12", "FOLATE", "FERRITIN", "TIBC", "IRON", "RETICCTPCT" in the last 72 hours. Sepsis Labs: Recent Labs  Lab 03/04/22 1412  PROCALCITON 0.34  LATICACIDVEN 1.8    Recent Results (from the past 240 hour(s))  Blood Culture (routine x 2)     Status: None (Preliminary result)   Collection Time: 03/04/22  1:52 PM   Specimen: BLOOD  Result Value Ref Range Status   Specimen Description   Final    BLOOD RAC Performed at [facility], [address] [place], [place] [address]    Special Requests   Final    BOTTLES DRAWN AEROBIC AND ANAEROBIC BCAV Performed at [facility], [address] [place], [place] [address]    Culture  Setup Time   Final    Organism ID to follow GRAM POSITIVE COCCI IN BOTH AEROBIC AND ANAEROBIC BOTTLES CRITICAL RESULT CALLED TO, READ BACK BY AND VERIFIED WITH: JASON BELEUE $RemoveBefo'[email or web address]$  ON 03/05/22 SKL GRAM STAIN REVIEWED-AGREE WITH RESULT Performed at [facility], [place] [address], [place], [place] [address]    Culture GRAM POSITIVE COCCI  Final   Report Status PENDING  Incomplete  Blood Culture ID Panel (Reflexed)     Status: Abnormal   Collection Time: 03/04/22  1:52 PM  Result Value Ref Range Status   Enterococcus faecalis NOT DETECTED NOT DETECTED Final   Enterococcus Faecium NOT DETECTED NOT DETECTED Final   Listeria monocytogenes NOT DETECTED NOT DETECTED Final   Staphylococcus species NOT DETECTED NOT DETECTED Final   Staphylococcus aureus (BCID) NOT DETECTED NOT DETECTED Final   Staphylococcus epidermidis NOT DETECTED NOT DETECTED Final   Staphylococcus lugdunensis NOT DETECTED NOT DETECTED Final   Streptococcus species DETECTED (A) NOT DETECTED Final    Comment: CRITICAL  RESULT CALLED TO, READ BACK BY AND VERIFIED WITH: JASON BELEUE $RemoveBefo'[email or web address]'[email or web address]$  ON 03/05/22 SKL    Streptococcus agalactiae DETECTED (A) NOT DETECTED Final    Comment: CRITICAL RESULT CALLED TO, READ BACK BY AND VERIFIED WITH: JASON BELEUE $RemoveBefo'[email or web address]'[email or web address]$  ON 03/05/22 SKL    Streptococcus pneumoniae NOT DETECTED NOT DETECTED Final   Streptococcus pyogenes NOT DETECTED NOT DETECTED Final   A.calcoaceticus-baumannii NOT DETECTED NOT DETECTED Final   Bacteroides fragilis NOT DETECTED NOT DETECTED Final   Enterobacterales NOT DETECTED NOT DETECTED Final   Enterobacter cloacae complex NOT DETECTED NOT DETECTED Final   Escherichia coli NOT DETECTED NOT DETECTED Final   Klebsiella aerogenes NOT DETECTED NOT DETECTED Final   Klebsiella oxytoca NOT DETECTED NOT DETECTED Final   Klebsiella pneumoniae NOT DETECTED NOT DETECTED Final   Proteus species NOT DETECTED NOT DETECTED Final   Salmonella species NOT DETECTED  NOT DETECTED Final   Serratia marcescens NOT DETECTED NOT DETECTED Final   Haemophilus influenzae NOT DETECTED NOT DETECTED Final   Neisseria meningitidis NOT DETECTED NOT DETECTED Final   Pseudomonas aeruginosa NOT DETECTED NOT DETECTED Final   Stenotrophomonas maltophilia NOT DETECTED NOT DETECTED Final   Candida albicans NOT DETECTED NOT DETECTED Final   Candida auris NOT DETECTED NOT DETECTED Final   Candida glabrata NOT DETECTED NOT DETECTED Final   Candida krusei NOT DETECTED NOT DETECTED Final   Candida parapsilosis NOT DETECTED NOT DETECTED Final   Candida tropicalis NOT DETECTED NOT DETECTED Final   Cryptococcus neoformans/gattii NOT DETECTED NOT DETECTED Final    Comment: Performed at [facility], [place] [place], [place] [address]  Blood Culture (routine x 2)     Status: None (Preliminary result)   Collection Time: 03/04/22  1:57 PM   Specimen: BLOOD  Result Value Ref Range Status   Specimen Description   Final    BLOOD BLOOD RIGHT FOREARM Performed at [facility], [place] [address] [place], [place] [address]    Special Requests   Final    BOTTLES DRAWN AEROBIC AND ANAEROBIC Blood Culture results may not be optimal due to an inadequate volume of blood received in culture bottles Performed at [place] [address], [place], [place] [address]    Culture  Setup Time   Final    GRAM POSITIVE COCCI IN BOTH AEROBIC AND ANAEROBIC BOTTLES CRITICAL VALUE NOTED.  VALUE IS CONSISTENT WITH PREVIOUSLY REPORTED AND CALLED VALUE. Performed at [facility], [place] [address], [place], [place] [address]    Culture GRAM POSITIVE COCCI  Final   Report Status PENDING  Incomplete  Resp Panel by RT-PCR (Flu A&B, Covid) Anterior Nasal Swab     Status: None   Collection Time: 03/04/22  2:12 PM   Specimen: Anterior Nasal Swab  Result Value Ref Range Status   SARS Coronavirus 2 by RT PCR NEGATIVE NEGATIVE Final    Comment: (NOTE) SARS-CoV-2 target nucleic acids are NOT DETECTED.  The SARS-CoV-2 RNA is generally detectable in upper respiratory specimens during the acute phase of infection. The lowest concentration of SARS-CoV-2 viral copies this assay can detect is 138 copies/mL. A negative result does not preclude SARS-Cov-2 infection and should not be used as the sole basis for treatment or other patient management decisions. A negative result may occur with  improper specimen collection/handling, submission of specimen other than nasopharyngeal swab, presence of viral mutation(s) within the areas targeted by this assay, and inadequate number of viral copies(<138 copies/mL). A negative result must be combined with clinical observations, patient history, and epidemiological information. The expected result is Negative.  Fact Sheet for Patients:  EntrepreneurPulse.com.au  Fact Sheet for Healthcare Providers:  IncredibleEmployment.be  This test is no t yet approved or cleared by the [place] FDA and  has been  authorized for detection and/or diagnosis of SARS-CoV-2 by FDA under an Emergency Use Authorization (EUA). This EUA will remain  in effect (meaning this test can be used) for the duration of the COVID-19 declaration under Section 564(b)(1) of the Act, 21 U.S.C.section 360bbb-3(b)(1), unless the authorization is terminated  or revoked sooner.       Influenza A by PCR NEGATIVE NEGATIVE Final   Influenza B by PCR NEGATIVE NEGATIVE Final    Comment: (NOTE) The Xpert Xpress SARS-CoV-2/FLU/RSV plus assay is intended as an aid in the diagnosis of influenza from Nasopharyngeal  swab specimens and should not be used as a sole basis for treatment. Nasal washings and aspirates are unacceptable for Xpert Xpress SARS-CoV-2/FLU/RSV testing.  Fact Sheet for Patients: EntrepreneurPulse.com.au  Fact Sheet for Healthcare Providers: IncredibleEmployment.be  This test is not yet approved or cleared by the [place] FDA and has been authorized for detection and/or diagnosis of SARS-CoV-2 by FDA under an Emergency Use Authorization (EUA). This EUA will remain in effect (meaning this test can be used) for the duration of the COVID-19 declaration under Section 564(b)(1) of the Act, 21 U.S.C. section 360bbb-3(b)(1), unless the authorization is terminated or revoked.  Performed at [facility], [place], [place] [address]   C Difficile Quick Screen w PCR reflex     Status: None   Collection Time: 03/04/22  9:21 PM   Specimen: Stool  Result Value Ref Range Status   C Diff antigen NEGATIVE NEGATIVE Final   C Diff toxin NEGATIVE NEGATIVE Final   C Diff interpretation No C. difficile detected.  Final    Comment: Performed at [facility], [address] [place], [place] [address]  Gastrointestinal Panel by PCR , Stool     Status: None   Collection Time: 03/04/22  9:21 PM   Specimen: Stool  Result Value Ref Range Status    Campylobacter species NOT DETECTED NOT DETECTED Final   Plesimonas shigelloides NOT DETECTED NOT DETECTED Final   Salmonella species NOT DETECTED NOT DETECTED Final   Yersinia enterocolitica NOT DETECTED NOT DETECTED Final   Vibrio species NOT DETECTED NOT DETECTED Final   Vibrio cholerae NOT DETECTED NOT DETECTED Final   Enteroaggregative E coli (EAEC) NOT DETECTED NOT DETECTED Final   Enteropathogenic E coli (EPEC) NOT DETECTED NOT DETECTED Final   Enterotoxigenic E coli (ETEC) NOT DETECTED NOT DETECTED Final   Shiga like toxin producing E coli (STEC) NOT DETECTED NOT DETECTED Final   Shigella/Enteroinvasive E coli (EIEC) NOT DETECTED NOT DETECTED Final   Cryptosporidium NOT DETECTED NOT DETECTED Final   Cyclospora cayetanensis NOT DETECTED NOT DETECTED Final   Entamoeba histolytica NOT DETECTED NOT DETECTED Final   Giardia lamblia NOT DETECTED NOT DETECTED Final   Adenovirus F40/41 NOT DETECTED NOT DETECTED Final   Astrovirus NOT DETECTED NOT DETECTED Final   Norovirus GI/GII NOT DETECTED NOT DETECTED Final   Rotavirus A NOT DETECTED NOT DETECTED Final   Sapovirus (I, II, IV, and V) NOT DETECTED NOT DETECTED Final    Comment: Performed at [facility], [address] [place], [place] [address]         Radiology Studies: DG Chest Port 1 View  Result Date: 03/04/2022 CLINICAL DATA:  Questionable sepsis EXAM: PORTABLE CHEST - 1 VIEW COMPARISON:  12/25/2021 FINDINGS: Low lung volumes with crowding of bronchovascular structures, some increase in scattered airspace opacities in both lung bases in the mid left lung. Stable cardiomegaly. No effusion. Orthopedic hardware in the left scapula.  Bilateral shoulder DJD. IMPRESSION: New asymmetric infiltrates or edema, left greater than right. Electronically Signed   By: Lucrezia Europe M.D.   On: 03/04/2022 14:23        Scheduled Meds:  amLODipine  10 mg Oral Daily   aspirin EC  81 mg Oral Daily   atorvastatin  10 mg Oral Daily    calcium acetate  667 mg Oral TID WC   carvedilol  6.25 mg Oral BID   Chlorhexidine Gluconate Cloth  6 each Topical Q0600   famotidine  20 mg  Oral Daily   heparin  5,000 Units Subcutaneous Q8H   lisinopril  40 mg Oral Daily   Continuous Infusions:   ceFAZolin (ANCEF) IV Stopped (03/05/22 2129)     LOS: 2 days    Time spent: 25 mins     Wyvonnia Dusky, MD [facility] Pager [phone]  If 7PM-7AM, please contact night-coverage [email or web address] 03/06/2022, 8:03 AM

## 2022-03-07 ENCOUNTER — Inpatient Hospital Stay (HOSPITAL_COMMUNITY)
Admit: 2022-03-07 | Discharge: 2022-03-07 | Disposition: A | Discharge status: Home / Self Care | Payer: Medicare Other | Attending: Infectious Diseases | Admitting: Infectious Diseases

## 2022-03-07 DIAGNOSIS — R7881 Bacteremia: Secondary | ICD-10-CM | POA: Diagnosis not present

## 2022-03-07 DIAGNOSIS — E669 Obesity, unspecified: Secondary | ICD-10-CM | POA: Diagnosis not present

## 2022-03-07 DIAGNOSIS — N186 End stage renal disease: Secondary | ICD-10-CM | POA: Diagnosis not present

## 2022-03-07 DIAGNOSIS — Z992 Dependence on renal dialysis: Secondary | ICD-10-CM | POA: Diagnosis not present

## 2022-03-07 LAB — BASIC METABOLIC PANEL
Anion gap: 12 (ref 5–15)
BUN: 38 mg/dL — ABNORMAL HIGH (ref 8–23)
CO2: 27 mmol/L (ref 22–32)
Calcium: 8.7 mg/dL — ABNORMAL LOW (ref 8.9–10.3)
Chloride: 98 mmol/L (ref 98–111)
Creatinine, Ser: 7.36 mg/dL — ABNORMAL HIGH (ref 0.61–1.24)
GFR, Estimated: 8 mL/min — ABNORMAL LOW (ref >60)
Glucose, Bld: 83 mg/dL (ref 70–99)
Potassium: 4.3 mmol/L (ref 3.5–5.1)
Sodium: 137 mmol/L (ref 135–145)

## 2022-03-07 LAB — ECHOCARDIOGRAM COMPLETE
AR max vel: 2.71 cm2
AV Area VTI: 3.13 cm2
AV Area mean vel: 2.8 cm2
AV Mean grad: 4 mmHg
AV Peak grad: 6.3 mmHg
Ao pk vel: 1.25 m/s
Calc EF: 52.1 %
Height: 67 in
MV VTI: 1.82 cm2
S' Lateral: 4.38 cm
Single Plane A2C EF: 53.5 %
Single Plane A4C EF: 52.2 %
Weight: 3453.29 oz

## 2022-03-07 LAB — CBC
HCT: 36.3 % — ABNORMAL LOW (ref 39.0–52.0)
Hemoglobin: 11 g/dL — ABNORMAL LOW (ref 13.0–17.0)
MCH: 26 pg (ref 26.0–34.0)
MCHC: 30.3 g/dL (ref 30.0–36.0)
MCV: 85.8 fL (ref 80.0–100.0)
Platelets: 187 10*3/uL (ref 150–400)
RBC: 4.23 MIL/uL (ref 4.22–5.81)
RDW: 18 % — ABNORMAL HIGH (ref 11.5–15.5)
WBC: 5.3 10*3/uL (ref 4.0–10.5)
nRBC: 0 % (ref 0.0–0.2)

## 2022-03-07 LAB — URINE CULTURE

## 2022-03-07 MED ORDER — VANCOMYCIN HCL IN DEXTROSE 1-5 GM/200ML-% IV SOLN: 1000.0000 mg | INTRAVENOUS | Status: DC

## 2022-03-07 MED ORDER — VANCOMYCIN HCL 2000 MG/400ML IV SOLN
2000.0000 mg | Freq: Once | INTRAVENOUS | Status: AC
  Administered 2022-03-07: 2000 mg via INTRAVENOUS
  Filled 2022-03-07: qty 400

## 2022-03-07 NOTE — Progress Notes (Signed)
Pharmacy Antibiotic Note  Steve Vance is a 61 y.o. male w/ PMH of ESRD on HD (TTS), HLD, CHF, chronic pain, HTN admitted on 03/04/2022 with complaints of shortness of breath and chest discomfort and subsequently discovered to be bacteremic.  Pharmacy has been consulted for vancomycin dosing. Blood cultures initially grew out Grp B strep but afterwards S aureus was discovered in the cultures   Plan: start vancomycin 2000 mg IV x 1 Start vancomycin 1000 mg IV w/ each HD session Goal vancomycin level 15 - 25 mcg/mL prior to 3rd maintenance dose Follow blood culture sensitivities for potential de-escalation of therapy  Height: $Remove'5\' 7"'KdjTGhY$  (170.2 cm) Weight: 97.9 kg (215 lb 13.3 oz) IBW/kg (Calculated) : 66.1  Temp (24hrs), Avg:97.8 F (36.6 C), Min:97.4 F (36.3 C), Max:98.5 F (36.9 C)  Recent Labs  Lab 03/04/22 1412 03/05/22 0613 03/07/22 0804  WBC 5.6 8.6 5.3  CREATININE 8.30* 7.08* 7.36*  LATICACIDVEN 1.8  --   --     Estimated Creatinine Clearance: 11.6 mL/min (A) (by C-G formula based on SCr of 7.36 mg/dL (H)).    Allergies  Allergen Reactions   Baclofen Nausea And Vomiting   Gabapentin Nausea And Vomiting   Oxycodone-Acetaminophen Nausea And Vomiting    Antimicrobials this admission: 06/29 ceftriaxone x 1 06/29 azithromycin >> 06/30 06/30 cefazolin >> 07/02 07/02 vancomycin >>   Microbiology results: 06/29 BCx: GrpB strep (pan-sensitive), S aureus (sensitivities pending) 07/01 BCx NG x 1 day 06/29 UCx: recollection  06/29 GI panel (-) 06/29 C diff triple negative  Thank you for allowing pharmacy to be a part of this patient's care.  [name] 03/07/2022 9:48 AM

## 2022-03-07 NOTE — Progress Notes (Signed)
PHARMACY - PHYSICIAN COMMUNICATION CRITICAL VALUE ALERT - BLOOD CULTURE IDENTIFICATION (BCID)  Steve Vance is an 61 y.o. male w/ PMH of CHF, HTN, depression, ESRD on HD who presented to [facility] on 03/04/2022 with a chief complaint of shortness of breath and chest discomfort  Assessment:  4/4 Grp B strep ans S aureus (unknown sensitivities)   Name of physician Contacted: Lenise Herald, MD  Current antibiotics: cefazolin  Changes to prescribed antibiotics recommended:   Changed cefazolin to vancomycin until sensitivities are known  Results for orders placed or performed during the hospital encounter of 03/04/22  Blood Culture ID Panel (Reflexed) (Collected: 03/04/2022  1:52 PM)  Result Value Ref Range   Enterococcus faecalis NOT DETECTED NOT DETECTED   Enterococcus Faecium NOT DETECTED NOT DETECTED   Listeria monocytogenes NOT DETECTED NOT DETECTED   Staphylococcus species NOT DETECTED NOT DETECTED   Staphylococcus aureus (BCID) NOT DETECTED NOT DETECTED   Staphylococcus epidermidis NOT DETECTED NOT DETECTED   Staphylococcus lugdunensis NOT DETECTED NOT DETECTED   Streptococcus species DETECTED (A) NOT DETECTED   Streptococcus agalactiae DETECTED (A) NOT DETECTED   Streptococcus pneumoniae NOT DETECTED NOT DETECTED   Streptococcus pyogenes NOT DETECTED NOT DETECTED   A.calcoaceticus-baumannii NOT DETECTED NOT DETECTED   Bacteroides fragilis NOT DETECTED NOT DETECTED   Enterobacterales NOT DETECTED NOT DETECTED   Enterobacter cloacae complex NOT DETECTED NOT DETECTED   Escherichia coli NOT DETECTED NOT DETECTED   Klebsiella aerogenes NOT DETECTED NOT DETECTED   Klebsiella oxytoca NOT DETECTED NOT DETECTED   Klebsiella pneumoniae NOT DETECTED NOT DETECTED   Proteus species NOT DETECTED NOT DETECTED   Salmonella species NOT DETECTED NOT DETECTED   Serratia marcescens NOT DETECTED NOT DETECTED   Haemophilus influenzae NOT DETECTED NOT DETECTED   Neisseria meningitidis NOT DETECTED  NOT DETECTED   Pseudomonas aeruginosa NOT DETECTED NOT DETECTED   Stenotrophomonas maltophilia NOT DETECTED NOT DETECTED   Candida albicans NOT DETECTED NOT DETECTED   Candida auris NOT DETECTED NOT DETECTED   Candida glabrata NOT DETECTED NOT DETECTED   Candida krusei NOT DETECTED NOT DETECTED   Candida parapsilosis NOT DETECTED NOT DETECTED   Candida tropicalis NOT DETECTED NOT DETECTED   Cryptococcus neoformans/gattii NOT DETECTED NOT DETECTED    [name] 03/07/2022  9:43 AM

## 2022-03-07 NOTE — Progress Notes (Signed)
[facility]  ROUNDING NOTE   Subjective:   Steve Vance is a 61 male with past medical history including hypertension, diastolic chronic heart failure, depression, hypertension, and end-stage renal disease on hemodialysis.  Patient presents to the emergency department from his dialysis clinic with complaints of shortness of breath and chest discomfort.  Patient has been admitted for Hypoxia [R09.02] Acute respiratory failure with hypoxia (HCC) [J96.01] Hypervolemia, unspecified hypervolemia type [E87.70] Dyspnea, unspecified type [R06.00] Community acquired pneumonia, unspecified laterality [J18.9] Congestive heart failure, unspecified HF chronicity, unspecified heart failure type ([place]) [I50.9]  Patient is known to our practice and receives outpatient dialysis treatments at [facility] on a TTS schedule, supervised by Dr. Candiss Norse.  Patient had presented to the outpatient clinic about 10 pounds over his dry weight.  Outpatient clinic attempted to remove 4 L of fluid and patient had been in treatment for 1 hour before he began complaining of shortness of breath and chest discomfort.  Treatment terminated at that time and patient sent to emergency department for further evaluation.  Patient presented to the emergency department 5 L nasal cannula with productive cough.  Patient normally is on room air at baseline.  Denies chest discomfort on ED arrival.  Low-grade temp noted by EMS, 100 F.  Also reported several days of diarrhea, nausea, and dry heaves.  Denies abdominal pain.  Update:  It appears that cultures are now growing out Staph aureus.  Patient started on vancomycin. Underwent hemodialysis treatment yesterday.  Objective:  Vital signs in last 24 hours:  Temp:  [97.4 F (36.3 C)-98.5 F (36.9 C)] 97.4 F (36.3 C) (07/02 0800) Pulse Rate:  [81-94] 92 (07/02 0800) Resp:  [14-35] 18 (07/02 0438) BP: (139-175)/(81-99) 171/93 (07/02 0800) SpO2:  [93 %-100 %] 100 %  (07/02 0800) Weight:  [97.9 kg-100 kg] 97.9 kg (07/01 1930)  Weight change: -0.2 kg Filed Weights   03/06/22 0518 03/06/22 1432 03/06/22 1930  Weight: 100 kg 100 kg 97.9 kg    Intake/Output: I/O last 3 completed shifts: In: -  Out: 2043 [Other:2043]   Intake/Output this shift:  No intake/output data recorded.  Physical Exam: General: NAD  Head: Normocephalic, atraumatic. Moist oral mucosal membranes  Eyes: Anicteric  Lungs:  Clear bilateral, normal effort  Heart: Regular rate and rhythm  Abdomen:  Soft, nontender, obese  Extremities: No peripheral edema.  Neurologic: Nonfocal, moving all four extremities  Skin: No lesions  Access: Left aVF    Basic Metabolic Panel: Recent Labs  Lab 03/04/22 1412 03/05/22 0613 03/07/22 0804  NA 135 136 137  K 5.5* 4.5 4.3  CL 98 97* 98  CO2 20* 26 27  GLUCOSE 100* 109* 83  BUN 39* 29* 38*  CREATININE 8.30* 7.08* 7.36*  CALCIUM 8.3* 8.5* 8.7*     Liver Function Tests: Recent Labs  Lab 03/04/22 1412  AST 23  ALT 20  ALKPHOS 85  BILITOT 1.1  PROT 8.6*  ALBUMIN 3.8    No results for input(s): "LIPASE", "AMYLASE" in the last 168 hours. No results for input(s): "AMMONIA" in the last 168 hours.  CBC: Recent Labs  Lab 03/04/22 1412 03/05/22 0613 03/07/22 0804  WBC 5.6 8.6 5.3  NEUTROABS 5.1  --   --   HGB 12.3* 10.6* 11.0*  HCT 40.2 34.1* 36.3*  MCV 87.8 86.5 85.8  PLT 181 169 187     Cardiac Enzymes: No results for input(s): "CKTOTAL", "CKMB", "CKMBINDEX", "TROPONINI" in the last 168 hours.  BNP: Invalid  input(s): "POCBNP"  CBG: No results for input(s): "GLUCAP" in the last 168 hours.  Microbiology: Results for orders placed or performed during the hospital encounter of 03/04/22  Blood Culture (routine x 2)     Status: Abnormal (Preliminary result)   Collection Time: 03/04/22  1:52 PM   Specimen: BLOOD  Result Value Ref Range Status   Specimen Description   Final    BLOOD RAC Performed at [facility], [address] [place], [place] [address]    Special Requests   Final    BOTTLES DRAWN AEROBIC AND ANAEROBIC BCAV Performed at [facility], [address] [place], [place] [address]    Culture  Setup Time   Final    Organism ID to follow GRAM POSITIVE COCCI IN BOTH AEROBIC AND ANAEROBIC BOTTLES CRITICAL RESULT CALLED TO, READ BACK BY AND VERIFIED WITH: JASON BELEUE $RemoveBefo'[email or web address]$  ON 03/05/22 [facility] GRAM STAIN REVIEWED-AGREE WITH RESULT    Culture (A)  Final    GROUP B STREP(S.AGALACTIAE)ISOLATED STAPHYLOCOCCUS AUREUS SUSCEPTIBILITIES TO FOLLOW CRITICAL RESULT CALLED TO, READ BACK BY AND VERIFIED WITH: B. BIERS PHARMD, AT [id number] 03/07/22 D. VANHOOK Performed at [facility], [place] [address] [place], [place] [address]    Report Status PENDING  Incomplete   Organism ID, Bacteria GROUP B STREP(S.AGALACTIAE)ISOLATED  Final      Susceptibility   Group b strep(s.agalactiae)isolated - MIC*    CLINDAMYCIN <=0.25 SENSITIVE Sensitive     AMPICILLIN <=0.25 SENSITIVE Sensitive     ERYTHROMYCIN <=0.12 SENSITIVE Sensitive     VANCOMYCIN 0.5 SENSITIVE Sensitive     CEFTRIAXONE <=0.12 SENSITIVE Sensitive     LEVOFLOXACIN 1 SENSITIVE Sensitive     PENICILLIN Value in next row Sensitive      SENSITIVE<=0.06    * GROUP B STREP(S.AGALACTIAE)ISOLATED  Blood Culture ID Panel (Reflexed)     Status: Abnormal   Collection Time: 03/04/22  1:52 PM  Result Value Ref Range Status   Enterococcus faecalis NOT DETECTED NOT DETECTED Final   Enterococcus Faecium NOT DETECTED NOT DETECTED Final   Listeria monocytogenes NOT DETECTED NOT DETECTED Final   Staphylococcus species NOT DETECTED NOT DETECTED Final   Staphylococcus aureus (BCID) NOT DETECTED NOT DETECTED Final   Staphylococcus epidermidis NOT DETECTED NOT DETECTED Final   Staphylococcus lugdunensis NOT DETECTED NOT DETECTED Final   Streptococcus species DETECTED (A) NOT DETECTED Final    Comment: CRITICAL RESULT CALLED TO, READ BACK  BY AND VERIFIED WITH: JASON BELEUE $RemoveBefo'[email or web address]$  ON 03/05/22 [facility]    Streptococcus agalactiae DETECTED (A) NOT DETECTED Final    Comment: CRITICAL RESULT CALLED TO, READ BACK BY AND VERIFIED WITH: JASON BELEUE $RemoveBefo'[email or web address]$  ON 03/05/22 SKL    Streptococcus pneumoniae NOT DETECTED NOT DETECTED Final   Streptococcus pyogenes NOT DETECTED NOT DETECTED Final   A.calcoaceticus-baumannii NOT DETECTED NOT DETECTED Final   Bacteroides fragilis NOT DETECTED NOT DETECTED Final   Enterobacterales NOT DETECTED NOT DETECTED Final   Enterobacter cloacae complex NOT DETECTED NOT DETECTED Final   Escherichia coli NOT DETECTED NOT DETECTED Final   Klebsiella aerogenes NOT DETECTED NOT DETECTED Final   Klebsiella oxytoca NOT DETECTED NOT DETECTED Final   Klebsiella pneumoniae NOT DETECTED NOT DETECTED Final   Proteus species NOT DETECTED NOT DETECTED Final   Salmonella species NOT DETECTED NOT DETECTED Final   Serratia marcescens NOT DETECTED NOT DETECTED Final   Haemophilus influenzae NOT DETECTED NOT DETECTED Final   Neisseria meningitidis NOT DETECTED NOT DETECTED Final   Pseudomonas aeruginosa  NOT DETECTED NOT DETECTED Final   Stenotrophomonas maltophilia NOT DETECTED NOT DETECTED Final   Candida albicans NOT DETECTED NOT DETECTED Final   Candida auris NOT DETECTED NOT DETECTED Final   Candida glabrata NOT DETECTED NOT DETECTED Final   Candida krusei NOT DETECTED NOT DETECTED Final   Candida parapsilosis NOT DETECTED NOT DETECTED Final   Candida tropicalis NOT DETECTED NOT DETECTED Final   Cryptococcus neoformans/gattii NOT DETECTED NOT DETECTED Final    Comment: Performed at [facility], [address] [place], [place] [address]  Blood Culture (routine x 2)     Status: Abnormal (Preliminary result)   Collection Time: 03/04/22  1:57 PM   Specimen: BLOOD  Result Value Ref Range Status   Specimen Description BLOOD BLOOD RIGHT FOREARM  Final   Special Requests   Final    BOTTLES DRAWN AEROBIC AND  ANAEROBIC Blood Culture results may not be optimal due to an inadequate volume of blood received in culture bottles   Culture  Setup Time   Final    GRAM POSITIVE COCCI IN BOTH AEROBIC AND ANAEROBIC BOTTLES CRITICAL VALUE NOTED.  VALUE IS CONSISTENT WITH PREVIOUSLY REPORTED AND CALLED VALUE.    Culture (A)  Final    GROUP B STREP(S.AGALACTIAE)ISOLATED STAPHYLOCOCCUS AUREUS CRITICAL RESULT CALLED TO, READ BACK BY AND VERIFIED WITH: B. BIERS PHARMD, AT 1194 03/07/22 BY Rush Landmark Performed at [facility], [place] [address] [place], [place] [address]    Report Status PENDING  Incomplete  Resp Panel by RT-PCR (Flu A&B, Covid) Anterior Nasal Swab     Status: None   Collection Time: 03/04/22  2:12 PM   Specimen: Anterior Nasal Swab  Result Value Ref Range Status   SARS Coronavirus 2 by RT PCR NEGATIVE NEGATIVE Final    Comment: (NOTE) SARS-CoV-2 target nucleic acids are NOT DETECTED.  The SARS-CoV-2 RNA is generally detectable in upper respiratory specimens during the acute phase of infection. The lowest concentration of SARS-CoV-2 viral copies this assay can detect is 138 copies/mL. A negative result does not preclude SARS-Cov-2 infection and should not be used as the sole basis for treatment or other patient management decisions. A negative result may occur with  improper specimen collection/handling, submission of specimen other than nasopharyngeal swab, presence of viral mutation(s) within the areas targeted by this assay, and inadequate number of viral copies(<138 copies/mL). A negative result must be combined with clinical observations, patient history, and epidemiological information. The expected result is Negative.  Fact Sheet for Patients:  EntrepreneurPulse.com.au  Fact Sheet for Healthcare Providers:  IncredibleEmployment.be  This test is no t yet approved or cleared by the [place] FDA and  has been authorized for detection  and/or diagnosis of SARS-CoV-2 by FDA under an Emergency Use Authorization (EUA). This EUA will remain  in effect (meaning this test can be used) for the duration of the COVID-19 declaration under Section 564(b)(1) of the Act, 21 U.S.C.section 360bbb-3(b)(1), unless the authorization is terminated  or revoked sooner.       Influenza A by PCR NEGATIVE NEGATIVE Final   Influenza B by PCR NEGATIVE NEGATIVE Final    Comment: (NOTE) The Xpert Xpress SARS-CoV-2/FLU/RSV plus assay is intended as an aid in the diagnosis of influenza from Nasopharyngeal swab specimens and should not be used as a sole basis for treatment. Nasal washings and aspirates are unacceptable for Xpert Xpress SARS-CoV-2/FLU/RSV testing.  Fact Sheet for Patients: EntrepreneurPulse.com.au  Fact Sheet for Healthcare Providers: IncredibleEmployment.be  This test  is not yet approved or cleared by the [place] and has been authorized for detection and/or diagnosis of SARS-CoV-2 by FDA under an Emergency Use Authorization (EUA). This EUA will remain in effect (meaning this test can be used) for the duration of the COVID-19 declaration under Section 564(b)(1) of the Act, 21 U.S.C. section 360bbb-3(b)(1), unless the authorization is terminated or revoked.  Performed at [facility], [place], [place] [address]   C Difficile Quick Screen w PCR reflex     Status: None   Collection Time: 03/04/22  9:21 PM   Specimen: Stool  Result Value Ref Range Status   C Diff antigen NEGATIVE NEGATIVE Final   C Diff toxin NEGATIVE NEGATIVE Final   C Diff interpretation No C. difficile detected.  Final    Comment: Performed at [facility], [address] [place], [place] [address]  Gastrointestinal Panel by PCR , Stool     Status: None   Collection Time: 03/04/22  9:21 PM   Specimen: Stool  Result Value Ref Range Status   Campylobacter species NOT  DETECTED NOT DETECTED Final   Plesimonas shigelloides NOT DETECTED NOT DETECTED Final   Salmonella species NOT DETECTED NOT DETECTED Final   Yersinia enterocolitica NOT DETECTED NOT DETECTED Final   Vibrio species NOT DETECTED NOT DETECTED Final   Vibrio cholerae NOT DETECTED NOT DETECTED Final   Enteroaggregative E coli (EAEC) NOT DETECTED NOT DETECTED Final   Enteropathogenic E coli (EPEC) NOT DETECTED NOT DETECTED Final   Enterotoxigenic E coli (ETEC) NOT DETECTED NOT DETECTED Final   Shiga like toxin producing E coli (STEC) NOT DETECTED NOT DETECTED Final   Shigella/Enteroinvasive E coli (EIEC) NOT DETECTED NOT DETECTED Final   Cryptosporidium NOT DETECTED NOT DETECTED Final   Cyclospora cayetanensis NOT DETECTED NOT DETECTED Final   Entamoeba histolytica NOT DETECTED NOT DETECTED Final   Giardia lamblia NOT DETECTED NOT DETECTED Final   Adenovirus F40/41 NOT DETECTED NOT DETECTED Final   Astrovirus NOT DETECTED NOT DETECTED Final   Norovirus GI/GII NOT DETECTED NOT DETECTED Final   Rotavirus A NOT DETECTED NOT DETECTED Final   Sapovirus (I, II, IV, and V) NOT DETECTED NOT DETECTED Final    Comment: Performed at [facility], [address], [place], [place] [address]  Urine Culture     Status: Abnormal   Collection Time: 03/04/22  9:39 PM   Specimen: In/Out Cath Urine  Result Value Ref Range Status   Specimen Description   Final    IN/OUT CATH URINE Performed at [facility], [address], [place], [place] [address]    Special Requests   Final    NONE Performed at [facility], [address] [place], [place] [address]    Culture MULTIPLE SPECIES PRESENT, SUGGEST RECOLLECTION (A)  Final   Report Status 03/07/2022 FINAL  Final  Culture, blood (Routine X 2) w Reflex to ID Panel     Status: None (Preliminary result)   Collection Time: 03/06/22  4:50 AM   Specimen: BLOOD  Result Value Ref Range Status   Specimen Description BLOOD RIGHT  ANTECUBITAL  Final   Special Requests   Final    BOTTLES DRAWN AEROBIC AND ANAEROBIC Blood Culture adequate volume   Culture   Final    NO GROWTH 1 DAY Performed at [facility], [address] [place], [place] [address]    Report Status PENDING  Incomplete  Culture, blood (Routine X 2)  w Reflex to ID Panel     Status: None (Preliminary result)   Collection Time: 03/06/22  4:50 AM   Specimen: BLOOD  Result Value Ref Range Status   Specimen Description BLOOD BLOOD RIGHT WRIST  Final   Special Requests   Final    BOTTLES DRAWN AEROBIC AND ANAEROBIC Blood Culture adequate volume   Culture   Final    NO GROWTH 1 DAY Performed at [facility], [address] [place], [place] [address]    Report Status PENDING  Incomplete    Coagulation Studies: Recent Labs    03/04/22 1412  LABPROT 14.4  INR 1.1     Urinalysis: Recent Labs    03/04/22 2139  COLORURINE YELLOW*  LABSPEC 1.016  PHURINE 7.0  GLUCOSEU 50*  HGBUR MODERATE*  BILIRUBINUR NEGATIVE  KETONESUR NEGATIVE  PROTEINUR >=300*  NITRITE NEGATIVE  LEUKOCYTESUR MODERATE*       Imaging: No results found.   Medications:    [START ON 03/09/2022] vancomycin     vancomycin 2,000 mg (03/07/22 1145)    amLODipine  10 mg Oral Daily   aspirin EC  81 mg Oral Daily   atorvastatin  10 mg Oral Daily   calcium acetate  667 mg Oral TID WC   carvedilol  6.25 mg Oral BID   Chlorhexidine Gluconate Cloth  6 each Topical Q0600   famotidine  20 mg Oral Daily   heparin  5,000 Units Subcutaneous Q8H   lisinopril  40 mg Oral Daily   acetaminophen, albuterol, dextromethorphan-guaiFENesin, hydrALAZINE, ondansetron (ZOFRAN) IV  Assessment/ Plan:  Mr. Milan Perkins is a 61 y.o.  male past medical history including hypertension, diastolic chronic heart failure, depression, hypertension, and end-stage renal disease on hemodialysis.  Patient presents to the emergency department from his dialysis clinic with  complaints of shortness of breath and chest discomfort.  Patient has been admitted for Hypoxia [R09.02] Acute respiratory failure with hypoxia (HCC) [J96.01] Hypervolemia, unspecified hypervolemia type [E87.70] Dyspnea, unspecified type [R06.00] Community acquired pneumonia, unspecified laterality [J18.9] Congestive heart failure, unspecified HF chronicity, unspecified heart failure type ([place]) [I50.9]  [facility] TTS [place] Left AVF 107.5kg  End-stage renal disease on HD TTS.  Patient underwent hemodialysis treatment yesterday.  No acute indication for dialysis today.  We will plan for hemodialysis treatment again on Tuesday.  2. Anemia of chronic kidney disease Lab Results  Component Value Date   HGB 11.0 (L) 03/07/2022    Hemoglobin at acceptable goal.  Continue to monitor hemoglobin.  3. Secondary Hyperparathyroidism:  Lab Results  Component Value Date   PTH 301 (H) 09/24/2020   CALCIUM 8.7 (L) 03/07/2022   CAION 1.21 06/27/2019   PHOS 6.2 (H) 08/14/2021    Recheck phosphorus prior to next dialysis treatment.  Otherwise maintain the patient on calcium acetate.  4.  Hypertension with chronic kidney disease.  Home regimen includes amlodipine, carvedilol, hydralazine, hydrochlorothiazide and lisinopril.  Currently receiving amlodipine, carvedilol, and lisinopril.  Blood pressure currently 171/93.  Blood pressure does respond to dialysis.  5.  Sepsis/bacteremia.  Initially group B strep was going.  It appears that Staph aureus also growing now.  Infectious disease following.  Vancomycin added to regimen.   LOS: 3 Darsi Tien 7/2/202312:11 PM

## 2022-03-07 NOTE — Progress Notes (Signed)
PROGRESS NOTE    Steve Vance  ZOX:[id number] DOB: 1960/12/12 DOA: 03/04/2022 PCP: Theotis Burrow, MD  Assessment & Plan:   Principal Problem:   Acute respiratory failure with hypoxia ([place]) Active Problems:   Volume overload   Essential hypertension   ESRD on dialysis ([place])   Hyperlipidemia   Chronic diastolic CHF (congestive heart failure) (HCC)   Hyperkalemia   Diarrhea  Assessment and Plan: Acute hypoxic respiratory failure: likely secondary to fluid overload & possible pneumonia.  Continue on IV cefazolin, bronchodilators, encourage incentive spirometry. Volume management w/ HD. Weaned off of supplemental oxygen   Possible pneumonia: as per CXR. Continue on IV vanco, bronchodilators & encourage incentive spirometry   Bacteremia: blood cxs growing strep agalactiae & stap aureus, sens pending. Abxs changed to IV vanco. Repeat blood cxs NGTD. Echo shows EF 81-19%, diastolic function are indeterminate, LV demonstrates global hypokinesis, no vegetations mentioned.    Volume overload: resolved    HTN: poorly controlled. Continue on amlodipine, coreg, lisinopril. IV hydralazine prn    ESRD:  on HD TTS. Nephro recs apprec.    ACD: likely secondary to ESRD. H&H are stable    Diarrhea: etiology unclear. GI PCR panel, c. diff were both neg.    Hyperkalemia: resolved    Chronic diastolic CHF: echo on 14/78/2956 showed EF of 55 to 60%. Volume management w/ HD    HLD: continue on statin   Obesity: BMI 33.8. Complicates overall care & prognosis     DVT prophylaxis: heparin  Code Status: full  Family Communication: discussed pt's care w/ pt's family at bedside and answered their questions  Disposition Plan: likely d/c back home   Level of care: Progressive  Status is: Inpatient Remains inpatient appropriate because: severity of illness  Consultants:  ID  Procedures:   Antimicrobials: vanco    Subjective: Pt c/o malaise   Objective: Vitals:    03/06/22 2051 03/07/22 0057 03/07/22 0438 03/07/22 0800  BP: (!) 175/83 (!) 155/89 (!) 158/94 (!) 171/93  Pulse: 81 94 91 92  Resp: $Remo'18 18 18   'DeUww$ Temp: 97.7 F (36.5 C) 98.5 F (36.9 C) (!) 97.4 F (36.3 C) (!) 97.4 F (36.3 C)  TempSrc: Oral Oral Oral Oral  SpO2: 99% 100% 100% 100%  Weight:      Height:        Intake/Output Summary (Last 24 hours) at 03/07/2022 1336 Last data filed at 03/06/2022 1800 Gross per 24 hour  Intake --  Output 2043 ml  Net -2043 ml   Filed Weights   03/06/22 0518 03/06/22 1432 03/06/22 1930  Weight: 100 kg 100 kg 97.9 kg    Examination:  General exam: Appears calm & comfortable  Respiratory system: decreased breath sounds b/l  Cardiovascular system: S1 & S2+. No rubs or gallops Gastrointestinal system: Abd is soft, NT, obese & hyperactive bowel sounds  Central nervous system: Alert and oriented. Moves all extremities  Psychiatry: Judgement and insight appears at baseline. Appropriate mood and affect     Data Reviewed: I have personally reviewed following labs and imaging studies  CBC: Recent Labs  Lab 03/04/22 1412 03/05/22 0613 03/07/22 0804  WBC 5.6 8.6 5.3  NEUTROABS 5.1  --   --   HGB 12.3* 10.6* 11.0*  HCT 40.2 34.1* 36.3*  MCV 87.8 86.5 85.8  PLT 181 169 213   Basic Metabolic Panel: Recent Labs  Lab 03/04/22 1412 03/05/22 0613 03/07/22 0804  NA 135 136 137  K 5.5* 4.5 4.3  CL 98 97* 98  CO2 20* 26 27  GLUCOSE 100* 109* 83  BUN 39* 29* 38*  CREATININE 8.30* 7.08* 7.36*  CALCIUM 8.3* 8.5* 8.7*   GFR: Estimated Creatinine Clearance: 11.6 mL/min (A) (by C-G formula based on SCr of 7.36 mg/dL (H)). Liver Function Tests: Recent Labs  Lab 03/04/22 1412  AST 23  ALT 20  ALKPHOS 85  BILITOT 1.1  PROT 8.6*  ALBUMIN 3.8   No results for input(s): "LIPASE", "AMYLASE" in the last 168 hours. No results for input(s): "AMMONIA" in the last 168 hours. Coagulation Profile: Recent Labs  Lab 03/04/22 1412  INR 1.1    Cardiac Enzymes: No results for input(s): "CKTOTAL", "CKMB", "CKMBINDEX", "TROPONINI" in the last 168 hours. BNP (last 3 results) No results for input(s): "PROBNP" in the last 8760 hours. HbA1C: No results for input(s): "HGBA1C" in the last 72 hours. CBG: No results for input(s): "GLUCAP" in the last 168 hours. Lipid Profile: No results for input(s): "CHOL", "HDL", "LDLCALC", "TRIG", "CHOLHDL", "LDLDIRECT" in the last 72 hours. Thyroid Function Tests: No results for input(s): "TSH", "T4TOTAL", "FREET4", "T3FREE", "THYROIDAB" in the last 72 hours. Anemia Panel: No results for input(s): "VITAMINB12", "FOLATE", "FERRITIN", "TIBC", "IRON", "RETICCTPCT" in the last 72 hours. Sepsis Labs: Recent Labs  Lab 03/04/22 1412  PROCALCITON 0.34  LATICACIDVEN 1.8    Recent Results (from the past 240 hour(s))  Blood Culture (routine x 2)     Status: Abnormal (Preliminary result)   Collection Time: 03/04/22  1:52 PM   Specimen: BLOOD  Result Value Ref Range Status   Specimen Description   Final    BLOOD RAC Performed at [facility], [address], [place], [place] [address]    Special Requests   Final    BOTTLES DRAWN AEROBIC AND ANAEROBIC BCAV Performed at [facility], [address] [place], [place] [address]    Culture  Setup Time   Final    Organism ID to follow GRAM POSITIVE COCCI IN BOTH AEROBIC AND ANAEROBIC BOTTLES CRITICAL RESULT CALLED TO, READ BACK BY AND VERIFIED WITH: JASON BELEUE $RemoveBefo'[email or web address]$  ON 03/05/22 SKL GRAM STAIN REVIEWED-AGREE WITH RESULT    Culture (A)  Final    GROUP B STREP(S.AGALACTIAE)ISOLATED STAPHYLOCOCCUS AUREUS SUSCEPTIBILITIES TO FOLLOW CRITICAL RESULT CALLED TO, READ BACK BY AND VERIFIED WITH: B. BIERS PHARMD, AT 3817 03/07/22 D. VANHOOK Performed at [facility], [place] [address], [place], [place] [address]    Report Status PENDING  Incomplete   Organism ID, Bacteria GROUP B STREP(S.AGALACTIAE)ISOLATED  Final      Susceptibility    Group b strep(s.agalactiae)isolated - MIC*    CLINDAMYCIN <=0.25 SENSITIVE Sensitive     AMPICILLIN <=0.25 SENSITIVE Sensitive     ERYTHROMYCIN <=0.12 SENSITIVE Sensitive     VANCOMYCIN 0.5 SENSITIVE Sensitive     CEFTRIAXONE <=0.12 SENSITIVE Sensitive     LEVOFLOXACIN 1 SENSITIVE Sensitive     PENICILLIN Value in next row Sensitive      SENSITIVE<=0.06    * GROUP B STREP(S.AGALACTIAE)ISOLATED  Blood Culture ID Panel (Reflexed)     Status: Abnormal   Collection Time: 03/04/22  1:52 PM  Result Value Ref Range Status   Enterococcus faecalis NOT DETECTED NOT DETECTED Final   Enterococcus Faecium NOT DETECTED NOT DETECTED Final   Listeria monocytogenes NOT DETECTED NOT DETECTED Final   Staphylococcus species NOT DETECTED NOT DETECTED Final   Staphylococcus aureus (BCID) NOT DETECTED NOT DETECTED Final   Staphylococcus epidermidis NOT DETECTED NOT  DETECTED Final   Staphylococcus lugdunensis NOT DETECTED NOT DETECTED Final   Streptococcus species DETECTED (A) NOT DETECTED Final    Comment: CRITICAL RESULT CALLED TO, READ BACK BY AND VERIFIED WITH: JASON BELEUE $RemoveBefo'[email or web address]$  ON 03/05/22 SKL    Streptococcus agalactiae DETECTED (A) NOT DETECTED Final    Comment: CRITICAL RESULT CALLED TO, READ BACK BY AND VERIFIED WITH: JASON BELEUE $RemoveBefo'[email or web address]$  ON 03/05/22 SKL    Streptococcus pneumoniae NOT DETECTED NOT DETECTED Final   Streptococcus pyogenes NOT DETECTED NOT DETECTED Final   A.calcoaceticus-baumannii NOT DETECTED NOT DETECTED Final   Bacteroides fragilis NOT DETECTED NOT DETECTED Final   Enterobacterales NOT DETECTED NOT DETECTED Final   Enterobacter cloacae complex NOT DETECTED NOT DETECTED Final   Escherichia coli NOT DETECTED NOT DETECTED Final   Klebsiella aerogenes NOT DETECTED NOT DETECTED Final   Klebsiella oxytoca NOT DETECTED NOT DETECTED Final   Klebsiella pneumoniae NOT DETECTED NOT DETECTED Final   Proteus species NOT DETECTED NOT DETECTED Final   Salmonella species NOT DETECTED NOT  DETECTED Final   Serratia marcescens NOT DETECTED NOT DETECTED Final   Haemophilus influenzae NOT DETECTED NOT DETECTED Final   Neisseria meningitidis NOT DETECTED NOT DETECTED Final   Pseudomonas aeruginosa NOT DETECTED NOT DETECTED Final   Stenotrophomonas maltophilia NOT DETECTED NOT DETECTED Final   Candida albicans NOT DETECTED NOT DETECTED Final   Candida auris NOT DETECTED NOT DETECTED Final   Candida glabrata NOT DETECTED NOT DETECTED Final   Candida krusei NOT DETECTED NOT DETECTED Final   Candida parapsilosis NOT DETECTED NOT DETECTED Final   Candida tropicalis NOT DETECTED NOT DETECTED Final   Cryptococcus neoformans/gattii NOT DETECTED NOT DETECTED Final    Comment: Performed at [facility], [address] [place], [place] [address]  Blood Culture (routine x 2)     Status: Abnormal (Preliminary result)   Collection Time: 03/04/22  1:57 PM   Specimen: BLOOD  Result Value Ref Range Status   Specimen Description BLOOD BLOOD RIGHT FOREARM  Final   Special Requests   Final    BOTTLES DRAWN AEROBIC AND ANAEROBIC Blood Culture results may not be optimal due to an inadequate volume of blood received in culture bottles   Culture  Setup Time   Final    GRAM POSITIVE COCCI IN BOTH AEROBIC AND ANAEROBIC BOTTLES CRITICAL VALUE NOTED.  VALUE IS CONSISTENT WITH PREVIOUSLY REPORTED AND CALLED VALUE.    Culture (A)  Final    GROUP B STREP(S.AGALACTIAE)ISOLATED STAPHYLOCOCCUS AUREUS CRITICAL RESULT CALLED TO, READ BACK BY AND VERIFIED WITH: B. BIERS PHARMD, AT 4142 03/07/22 BY Rush Landmark Performed at [facility], [place] [address] [place], [place] [address]    Report Status PENDING  Incomplete  Resp Panel by RT-PCR (Flu A&B, Covid) Anterior Nasal Swab     Status: None   Collection Time: 03/04/22  2:12 PM   Specimen: Anterior Nasal Swab  Result Value Ref Range Status   SARS Coronavirus 2 by RT PCR NEGATIVE NEGATIVE Final    Comment: (NOTE) SARS-CoV-2 target nucleic  acids are NOT DETECTED.  The SARS-CoV-2 RNA is generally detectable in upper respiratory specimens during the acute phase of infection. The lowest concentration of SARS-CoV-2 viral copies this assay can detect is 138 copies/mL. A negative result does not preclude SARS-Cov-2 infection and should not be used as the sole basis for treatment or other patient management decisions. A negative result may occur with  improper specimen collection/handling, submission of specimen other than nasopharyngeal swab,  presence of viral mutation(s) within the areas targeted by this assay, and inadequate number of viral copies(<138 copies/mL). A negative result must be combined with clinical observations, patient history, and epidemiological information. The expected result is Negative.  Fact Sheet for Patients:  EntrepreneurPulse.com.au  Fact Sheet for Healthcare Providers:  IncredibleEmployment.be  This test is no t yet approved or cleared by the [place] FDA and  has been authorized for detection and/or diagnosis of SARS-CoV-2 by FDA under an Emergency Use Authorization (EUA). This EUA will remain  in effect (meaning this test can be used) for the duration of the COVID-19 declaration under Section 564(b)(1) of the Act, 21 U.S.C.section 360bbb-3(b)(1), unless the authorization is terminated  or revoked sooner.       Influenza A by PCR NEGATIVE NEGATIVE Final   Influenza B by PCR NEGATIVE NEGATIVE Final    Comment: (NOTE) The Xpert Xpress SARS-CoV-2/FLU/RSV plus assay is intended as an aid in the diagnosis of influenza from Nasopharyngeal swab specimens and should not be used as a sole basis for treatment. Nasal washings and aspirates are unacceptable for Xpert Xpress SARS-CoV-2/FLU/RSV testing.  Fact Sheet for Patients: EntrepreneurPulse.com.au  Fact Sheet for Healthcare Providers: IncredibleEmployment.be  This  test is not yet approved or cleared by the [place] FDA and has been authorized for detection and/or diagnosis of SARS-CoV-2 by FDA under an Emergency Use Authorization (EUA). This EUA will remain in effect (meaning this test can be used) for the duration of the COVID-19 declaration under Section 564(b)(1) of the Act, 21 U.S.C. section 360bbb-3(b)(1), unless the authorization is terminated or revoked.  Performed at [facility], [place], [place] [address]   C Difficile Quick Screen w PCR reflex     Status: None   Collection Time: 03/04/22  9:21 PM   Specimen: Stool  Result Value Ref Range Status   C Diff antigen NEGATIVE NEGATIVE Final   C Diff toxin NEGATIVE NEGATIVE Final   C Diff interpretation No C. difficile detected.  Final    Comment: Performed at [facility], [address] [place], [place] [address]  Gastrointestinal Panel by PCR , Stool     Status: None   Collection Time: 03/04/22  9:21 PM   Specimen: Stool  Result Value Ref Range Status   Campylobacter species NOT DETECTED NOT DETECTED Final   Plesimonas shigelloides NOT DETECTED NOT DETECTED Final   Salmonella species NOT DETECTED NOT DETECTED Final   Yersinia enterocolitica NOT DETECTED NOT DETECTED Final   Vibrio species NOT DETECTED NOT DETECTED Final   Vibrio cholerae NOT DETECTED NOT DETECTED Final   Enteroaggregative E coli (EAEC) NOT DETECTED NOT DETECTED Final   Enteropathogenic E coli (EPEC) NOT DETECTED NOT DETECTED Final   Enterotoxigenic E coli (ETEC) NOT DETECTED NOT DETECTED Final   Shiga like toxin producing E coli (STEC) NOT DETECTED NOT DETECTED Final   Shigella/Enteroinvasive E coli (EIEC) NOT DETECTED NOT DETECTED Final   Cryptosporidium NOT DETECTED NOT DETECTED Final   Cyclospora cayetanensis NOT DETECTED NOT DETECTED Final   Entamoeba histolytica NOT DETECTED NOT DETECTED Final   Giardia lamblia NOT DETECTED NOT DETECTED Final   Adenovirus F40/41 NOT  DETECTED NOT DETECTED Final   Astrovirus NOT DETECTED NOT DETECTED Final   Norovirus GI/GII NOT DETECTED NOT DETECTED Final   Rotavirus A NOT DETECTED NOT DETECTED Final   Sapovirus (I, II, IV, and V) NOT DETECTED NOT DETECTED Final    Comment: Performed at [facility],  [place], [place] [address]  Urine Culture     Status: Abnormal   Collection Time: 03/04/22  9:39 PM   Specimen: In/Out Cath Urine  Result Value Ref Range Status   Specimen Description   Final    IN/OUT CATH URINE Performed at [facility], [address], [place], [place] [address]    Special Requests   Final    NONE Performed at [facility], [address] [place], [place] [address]    Culture MULTIPLE SPECIES PRESENT, SUGGEST RECOLLECTION (A)  Final   Report Status 03/07/2022 FINAL  Final  Culture, blood (Routine X 2) w Reflex to ID Panel     Status: None (Preliminary result)   Collection Time: 03/06/22  4:50 AM   Specimen: BLOOD  Result Value Ref Range Status   Specimen Description BLOOD RIGHT ANTECUBITAL  Final   Special Requests   Final    BOTTLES DRAWN AEROBIC AND ANAEROBIC Blood Culture adequate volume   Culture   Final    NO GROWTH 1 DAY Performed at [facility], [address], [place], [place] [address]    Report Status PENDING  Incomplete  Culture, blood (Routine X 2) w Reflex to ID Panel     Status: None (Preliminary result)   Collection Time: 03/06/22  4:50 AM   Specimen: BLOOD  Result Value Ref Range Status   Specimen Description BLOOD BLOOD RIGHT WRIST  Final   Special Requests   Final    BOTTLES DRAWN AEROBIC AND ANAEROBIC Blood Culture adequate volume   Culture   Final    NO GROWTH 1 DAY Performed at [facility], [address] [place],  [address]    Report Status PENDING  Incomplete         Radiology Studies: ECHOCARDIOGRAM COMPLETE  Result Date: 03/07/2022    ECHOCARDIOGRAM REPORT   Patient Name:    Steve Vance Date of Exam: 03/07/2022 Medical Rec #:  [id number]     Height:       67.0 in Accession #:    [id number]    Weight:       215.8 lb Date of Birth:  10/26/1960    BSA:          2.089 m Patient Age:    61 years      BP:           158/94 mmHg Patient Gender: M             HR:           90 bpm. Exam Location:  [facility] Procedure: 2D Echo Indications:     Bacteremia  History:         Patient has prior history of Echocardiogram examinations. DCHF;                  Risk Factors:ERSD on HD, Dyslipidemia and Hypertension.  Sonographer:     L. Thornton-Maynard Referring Phys:  [id number] Tsosie Billing Diagnosing Phys: Buford Dresser MD IMPRESSIONS  1. Left ventricular ejection fraction, by estimation, is 45 to 50%. The left ventricle has mildly decreased function. The left ventricle demonstrates global hypokinesis. The left ventricular internal cavity size was mildly dilated. There is moderate concentric left ventricular hypertrophy. Left ventricular diastolic parameters are indeterminate.  2. Right ventricular systolic function is normal. The right ventricular size is mildly enlarged. There is moderately elevated pulmonary artery systolic pressure. The estimated right ventricular systolic pressure is 58.3 mmHg.  3. Left atrial size was severely dilated.  4. Right atrial size was moderately dilated.  5. The mitral valve is degenerative. Mild to moderate mitral valve regurgitation. No evidence of mitral stenosis. Moderate to severe mitral annular calcification.  6. The aortic valve is tricuspid. There is mild calcification of the aortic valve. There is mild thickening of the aortic valve. Aortic valve regurgitation is not visualized. Aortic valve sclerosis/calcification is present, without any evidence of aortic stenosis.  7. Aortic dilatation noted. There is mild dilatation of the ascending aorta, measuring 40 mm.  8. The inferior vena cava is dilated in size with <50% respiratory variability, suggesting  right atrial pressure of 15 mmHg. Comparison(s): Prior images reviewed side by side. Changes from prior study are noted. EF mildly reduced compared to prior TTE in 2020. Conclusion(s)/Recommendation(s): No evidence of valvular vegetations on this transthoracic echocardiogram. Consider a transesophageal echocardiogram to exclude infective endocarditis if clinically indicated. FINDINGS  Left Ventricle: Left ventricular ejection fraction, by estimation, is 45 to 50%. The left ventricle has mildly decreased function. The left ventricle demonstrates global hypokinesis. The left ventricular internal cavity size was mildly dilated. There is  moderate concentric left ventricular hypertrophy. Left ventricular diastolic parameters are indeterminate. Right Ventricle: The right ventricular size is mildly enlarged. No increase in right ventricular wall thickness. Right ventricular systolic function is normal. There is moderately elevated pulmonary artery systolic pressure. The tricuspid regurgitant velocity is 2.95 m/s, and with an assumed right atrial pressure of 15 mmHg, the estimated right ventricular systolic pressure is 93.7 mmHg. Left Atrium: Left atrial size was severely dilated. Right Atrium: Right atrial size was moderately dilated. Pericardium: There is no evidence of pericardial effusion. Mitral Valve: The mitral valve is degenerative in appearance. Moderate to severe mitral annular calcification. Mild to moderate mitral valve regurgitation. No evidence of mitral valve stenosis. MV peak gradient, 17.5 mmHg. The mean mitral valve gradient is 8.0 mmHg. Tricuspid Valve: The tricuspid valve is normal in structure. Tricuspid valve regurgitation is mild . No evidence of tricuspid stenosis. Aortic Valve: The aortic valve is tricuspid. There is mild calcification of the aortic valve. There is mild thickening of the aortic valve. Aortic valve regurgitation is not visualized. Aortic valve sclerosis/calcification is present,  without any evidence of aortic stenosis. Aortic valve mean gradient measures 4.0 mmHg. Aortic valve peak gradient measures 6.2 mmHg. Aortic valve area, by VTI measures 3.13 cm. Pulmonic Valve: The pulmonic valve was grossly normal. Pulmonic valve regurgitation is trivial. No evidence of pulmonic stenosis. Aorta: Aortic dilatation noted. There is mild dilatation of the ascending aorta, measuring 40 mm. Venous: The inferior vena cava is dilated in size with less than 50% respiratory variability, suggesting right atrial pressure of 15 mmHg. IAS/Shunts: The atrial septum is grossly normal.  LEFT VENTRICLE PLAX 2D LVIDd:         5.41 cm      Diastology LVIDs:         4.38 cm      LV e' medial:    9.46 cm/s LV PW:         1.62 cm      LV E/e' medial:  11.0 LV IVS:        1.61 cm      LV e' lateral:   11.30 cm/s LVOT diam:     2.40 cm      LV E/e' lateral: 9.2 LV SV:         65 LV SV Index:   31  LVOT Area:     4.52 cm  LV Volumes (MOD) LV vol d, MOD A2C: 140.0 ml LV vol d, MOD A4C: 185.0 ml LV vol s, MOD A2C: 65.1 ml LV vol s, MOD A4C: 88.4 ml LV SV MOD A2C:     74.9 ml LV SV MOD A4C:     185.0 ml LV SV MOD BP:      86.0 ml RIGHT VENTRICLE RV S prime:     12.10 cm/s TAPSE (M-mode): 1.7 cm LEFT ATRIUM              Index        RIGHT ATRIUM           Index LA diam:        3.60 cm  1.72 cm/m   RA Area:     27.10 cm LA Vol (A2C):   145.0 ml 69.42 ml/m  RA Volume:   99.40 ml  47.59 ml/m LA Vol (A4C):   122.0 ml 58.41 ml/m LA Biplane Vol: 137.0 ml 65.59 ml/m  AORTIC VALVE                    PULMONIC VALVE AV Area (Vmax):    2.71 cm     PV Vmax:       0.62 m/s AV Area (Vmean):   2.80 cm     PV Peak grad:  1.6 mmHg AV Area (VTI):     3.13 cm AV Vmax:           125.00 cm/s AV Vmean:          87.900 cm/s AV VTI:            0.207 m AV Peak Grad:      6.2 mmHg AV Mean Grad:      4.0 mmHg LVOT Vmax:         75.00 cm/s LVOT Vmean:        54.400 cm/s LVOT VTI:          0.143 m LVOT/AV VTI ratio: 0.69  AORTA Ao Root diam: 3.90  cm Ao Asc diam:  4.00 cm MITRAL VALVE                TRICUSPID VALVE MV Area VTI:  1.82 cm      TR Peak grad:   34.8 mmHg MV Peak grad: 17.5 mmHg     TR Vmax:        295.00 cm/s MV Mean grad: 8.0 mmHg MV Vmax:      2.09 m/s      SHUNTS MV Vmean:     125.0 cm/s    Systemic VTI:  0.14 m MV E velocity: 104.00 cm/s  Systemic Diam: 2.40 cm MV A velocity: 188.00 cm/s MV E/A ratio:  0.55 Buford Dresser MD Electronically signed by Buford Dresser MD Signature Date/Time: 03/07/2022/12:29:05 PM    Final         Scheduled Meds:  amLODipine  10 mg Oral Daily   aspirin EC  81 mg Oral Daily   atorvastatin  10 mg Oral Daily   calcium acetate  667 mg Oral TID WC   carvedilol  6.25 mg Oral BID   Chlorhexidine Gluconate Cloth  6 each Topical Q0600   famotidine  20 mg Oral Daily   heparin  5,000 Units Subcutaneous Q8H   lisinopril  40 mg Oral Daily   Continuous Infusions:  [START ON 03/09/2022] vancomycin     vancomycin 2,000  mg (03/07/22 1145)     LOS: 3 days    Time spent: 25 mins     Wyvonnia Dusky, MD [facility] Pager [phone]  If 7PM-7AM, please contact night-coverage [email or web address] 03/07/2022, 1:36 PM

## 2022-03-08 ENCOUNTER — Inpatient Hospital Stay: Payer: Medicare Other

## 2022-03-08 DIAGNOSIS — N186 End stage renal disease: Secondary | ICD-10-CM | POA: Diagnosis not present

## 2022-03-08 DIAGNOSIS — Z992 Dependence on renal dialysis: Secondary | ICD-10-CM | POA: Diagnosis not present

## 2022-03-08 DIAGNOSIS — R7881 Bacteremia: Secondary | ICD-10-CM | POA: Diagnosis not present

## 2022-03-08 DIAGNOSIS — E669 Obesity, unspecified: Secondary | ICD-10-CM | POA: Diagnosis not present

## 2022-03-08 LAB — CULTURE, BLOOD (ROUTINE X 2)

## 2022-03-08 LAB — CBC
HCT: 36.4 % — ABNORMAL LOW (ref 39.0–52.0)
Hemoglobin: 11.3 g/dL — ABNORMAL LOW (ref 13.0–17.0)
MCH: 26.5 pg (ref 26.0–34.0)
MCHC: 31 g/dL (ref 30.0–36.0)
MCV: 85.4 fL (ref 80.0–100.0)
Platelets: 219 10*3/uL (ref 150–400)
RBC: 4.26 MIL/uL (ref 4.22–5.81)
RDW: 17.8 % — ABNORMAL HIGH (ref 11.5–15.5)
WBC: 5.4 10*3/uL (ref 4.0–10.5)
nRBC: 0 % (ref 0.0–0.2)

## 2022-03-08 LAB — BASIC METABOLIC PANEL
Anion gap: 14 (ref 5–15)
BUN: 56 mg/dL — ABNORMAL HIGH (ref 8–23)
CO2: 25 mmol/L (ref 22–32)
Calcium: 8.9 mg/dL (ref 8.9–10.3)
Chloride: 96 mmol/L — ABNORMAL LOW (ref 98–111)
Creatinine, Ser: 9.05 mg/dL — ABNORMAL HIGH (ref 0.61–1.24)
GFR, Estimated: 6 mL/min — ABNORMAL LOW (ref >60)
Glucose, Bld: 89 mg/dL (ref 70–99)
Potassium: 4.5 mmol/L (ref 3.5–5.1)
Sodium: 135 mmol/L (ref 135–145)

## 2022-03-08 MED ORDER — GADOBUTROL 1 MMOL/ML IV SOLN
9.0000 mL | Freq: Once | INTRAVENOUS | Status: AC | PRN
  Administered 2022-03-08: 9 mL via INTRAVENOUS

## 2022-03-08 MED ORDER — CAMPHOR-MENTHOL 0.5-0.5 % EX LOTN: TOPICAL_LOTION | CUTANEOUS | Status: DC | PRN

## 2022-03-08 MED ORDER — CEFAZOLIN SODIUM-DEXTROSE 2-4 GM/100ML-% IV SOLN
2.0000 g | Freq: Once | INTRAVENOUS | Status: AC
  Administered 2022-03-08: 2 g via INTRAVENOUS
  Filled 2022-03-08: qty 100

## 2022-03-08 MED ORDER — CEFAZOLIN SODIUM-DEXTROSE 2-4 GM/100ML-% IV SOLN
2.0000 g | INTRAVENOUS | Status: DC
  Administered 2022-03-09: 2 g via INTRAVENOUS
  Filled 2022-03-08 (×3): qty 100

## 2022-03-08 MED ORDER — CEFAZOLIN IN SODIUM CHLORIDE 3-0.9 GM/100ML-% IV SOLN: 3.0000 g | INTRAVENOUS | Status: DC

## 2022-03-08 MED ORDER — ISOSORBIDE MONONITRATE ER 30 MG PO TB24
30.0000 mg | ORAL_TABLET | Freq: Every day | ORAL | Status: DC
  Administered 2022-03-08 – 2022-03-10 (×3): 30 mg via ORAL
  Filled 2022-03-08 (×3): qty 1

## 2022-03-08 MED ORDER — SODIUM CHLORIDE 0.9 % IV SOLN
12.5000 mg | Freq: Four times a day (QID) | INTRAVENOUS | Status: DC | PRN
  Administered 2022-03-08: 12.5 mg via INTRAVENOUS
  Filled 2022-03-08: qty 12.5

## 2022-03-08 NOTE — Progress Notes (Signed)
PROGRESS NOTE    Steve Vance  EHU:[id number] DOB: Feb 26, 1961 DOA: 03/04/2022 PCP: Theotis Burrow, MD  Assessment & Plan:   Principal Problem:   Acute respiratory failure with hypoxia ([place]) Active Problems:   Volume overload   Essential hypertension   ESRD on dialysis ([place])   Hyperlipidemia   Chronic diastolic CHF (congestive heart failure) (H[facility])   Hyperkalemia   Diarrhea  Assessment and Plan: Acute hypoxic respiratory failure: likely secondary to fluid overload & possible pneumonia.  Resolved   Possible pneumonia: as per CXR. Continue on IV cefazolin, bronchodilators & encourage incentive spirometry   Bacteremia: blood cxs growing strep agalactiae & stap aureus, sens pending. Continue on IV cefazolin as per ID. Repeat blood cxs NGTD. Echo shows EF 78-58%, diastolic function are indeterminate, LV demonstrates global hypokinesis, no vegetations mentioned. TEE will be 03/10/22 AM as per cardio ([facility] cardio)    Volume overload: resolved    HTN: poorly controlled. Continue on amlodipine, coreg, lisinopril, and started on imdur. IV hydralazine prn   ESRD: on HD TTS. Nephro recs apprec   ACD: likely secondary to ESRD. H&H are stable    Diarrhea: etiology unclear. C. diff, GI PCR panel were both neg    Hyperkalemia: resolved    Chronic diastolic CHF: echo on 85/10/7739 showed EF of 55 to 60%. Volume management w/ HD    HLD: continue on statin    Obesity: BMI 33.8. Complicates overall care & prognosis     DVT prophylaxis: heparin  Code Status: full  Family Communication: discussed pt's care w/ pt's family at bedside and answered their questions  Disposition Plan: likely d/c back home   Level of care: Progressive  Status is: Inpatient Remains inpatient appropriate because: severity of illness  Consultants:  ID cardio  Procedures:   Antimicrobials: cefazolin    Subjective: Pt c/o fatigue   Objective: Vitals:   03/07/22 1700 03/07/22 1931 03/08/22  0002 03/08/22 0441  BP: (!) 181/95 (!) 152/83 (!) 164/94 (!) 164/93  Pulse: 94 93 92 89  Resp:  $Remo'20 20 20  'xIJFf$ Temp: 98.4 F (36.9 C) 98.6 F (37 C) 98.2 F (36.8 C) 98 F (36.7 C)  TempSrc: Oral Oral Oral Oral  SpO2: 99% 99% 99% 100%  Weight:      Height:        Intake/Output Summary (Last 24 hours) at 03/08/2022 0805 Last data filed at 03/08/2022 0738 Gross per 24 hour  Intake 640 ml  Output --  Net 640 ml   Filed Weights   03/06/22 0518 03/06/22 1432 03/06/22 1930  Weight: 100 kg 100 kg 97.9 kg    Examination:  General exam: Appears comfortable   Respiratory system: diminished breath sounds b/l otherwise clear  Cardiovascular system: S1/S2+. No rubs or clicks Gastrointestinal system: Abd is soft, NT, obese & hyperactive bowel sounds  Central nervous system: alert and oriented. Moves all extremities   Psychiatry: Judgement and insight appears at baseline. Appropriate mood and affect     Data Reviewed: I have personally reviewed following labs and imaging studies  CBC: Recent Labs  Lab 03/04/22 1412 03/05/22 0613 03/07/22 0804 03/08/22 0503  WBC 5.6 8.6 5.3 5.4  NEUTROABS 5.1  --   --   --   HGB 12.3* 10.6* 11.0* 11.3*  HCT 40.2 34.1* 36.3* 36.4*  MCV 87.8 86.5 85.8 85.4  PLT 181 169 187 287   Basic Metabolic Panel: Recent Labs  Lab 03/04/22 1412 03/05/22 0613 03/07/22 0804 03/08/22 0503  NA  135 136 137 135  K 5.5* 4.5 4.3 4.5  CL 98 97* 98 96*  CO2 20* $Remov'26 27 25  'rgyOQP$ GLUCOSE 100* 109* 83 89  BUN 39* 29* 38* 56*  CREATININE 8.30* 7.08* 7.36* 9.05*  CALCIUM 8.3* 8.5* 8.7* 8.9   GFR: Estimated Creatinine Clearance: 9.4 mL/min (A) (by C-G formula based on SCr of 9.05 mg/dL (H)). Liver Function Tests: Recent Labs  Lab 03/04/22 1412  AST 23  ALT 20  ALKPHOS 85  BILITOT 1.1  PROT 8.6*  ALBUMIN 3.8   No results for input(s): "LIPASE", "AMYLASE" in the last 168 hours. No results for input(s): "AMMONIA" in the last 168 hours. Coagulation Profile: Recent  Labs  Lab 03/04/22 1412  INR 1.1   Cardiac Enzymes: No results for input(s): "CKTOTAL", "CKMB", "CKMBINDEX", "TROPONINI" in the last 168 hours. BNP (last 3 results) No results for input(s): "PROBNP" in the last 8760 hours. HbA1C: No results for input(s): "HGBA1C" in the last 72 hours. CBG: No results for input(s): "GLUCAP" in the last 168 hours. Lipid Profile: No results for input(s): "CHOL", "HDL", "LDLCALC", "TRIG", "CHOLHDL", "LDLDIRECT" in the last 72 hours. Thyroid Function Tests: No results for input(s): "TSH", "T4TOTAL", "FREET4", "T3FREE", "THYROIDAB" in the last 72 hours. Anemia Panel: No results for input(s): "VITAMINB12", "FOLATE", "FERRITIN", "TIBC", "IRON", "RETICCTPCT" in the last 72 hours. Sepsis Labs: Recent Labs  Lab 03/04/22 1412  PROCALCITON 0.34  LATICACIDVEN 1.8    Recent Results (from the past 240 hour(s))  Blood Culture (routine x 2)     Status: Abnormal   Collection Time: 03/04/22  1:52 PM   Specimen: BLOOD  Result Value Ref Range Status   Specimen Description   Final    BLOOD RAC Performed at [facility], [address] [place], [place] [address]    Special Requests   Final    BOTTLES DRAWN AEROBIC AND ANAEROBIC BCAV Performed at [facility], [address] [place], [place] [address]    Culture  Setup Time   Final    Organism ID to follow GRAM POSITIVE COCCI IN BOTH AEROBIC AND ANAEROBIC BOTTLES CRITICAL RESULT CALLED TO, READ BACK BY AND VERIFIED WITH: JASON BELEUE $RemoveBefo'[id number]$  ON 03/05/22 SKL GRAM STAIN REVIEWED-AGREE WITH RESULT    Culture (A)  Final    GROUP B STREP(S.AGALACTIAE)ISOLATED STAPHYLOCOCCUS AUREUS CRITICAL RESULT CALLED TO, READ BACK BY AND VERIFIED WITH: B. BIERS PHARMD, AT [id number] 03/07/22 D. VANHOOK Performed at [facility], [place] [address], [place], [place] [address]    Report Status 03/08/2022 FINAL  Final   Organism ID, Bacteria GROUP B STREP(S.AGALACTIAE)ISOLATED  Final   Organism ID, Bacteria  STAPHYLOCOCCUS AUREUS  Final      Susceptibility   Group b strep(s.agalactiae)isolated - MIC*    CLINDAMYCIN <=0.25 SENSITIVE Sensitive     AMPICILLIN <=0.25 SENSITIVE Sensitive     ERYTHROMYCIN <=0.12 SENSITIVE Sensitive     VANCOMYCIN 0.5 SENSITIVE Sensitive     CEFTRIAXONE <=0.12 SENSITIVE Sensitive     LEVOFLOXACIN 1 SENSITIVE Sensitive     PENICILLIN Value in next row Sensitive      SENSITIVE<=0.06    * GROUP B STREP(S.AGALACTIAE)ISOLATED   Staphylococcus aureus - MIC*    CIPROFLOXACIN Value in next row Sensitive      SENSITIVE<=0.06    ERYTHROMYCIN Value in next row Sensitive      SENSITIVE<=0.06    GENTAMICIN Value in next row Sensitive      SENSITIVE<=0.06    OXACILLIN Value in  next row Sensitive      SENSITIVE<=0.06    TETRACYCLINE Value in next row Sensitive      SENSITIVE<=0.06    VANCOMYCIN Value in next row Sensitive      SENSITIVE<=0.06    TRIMETH/SULFA Value in next row Sensitive      SENSITIVE<=0.06    CLINDAMYCIN Value in next row Sensitive      SENSITIVE<=0.06    RIFAMPIN Value in next row Sensitive      SENSITIVE<=0.06    Inducible Clindamycin Value in next row Sensitive      SENSITIVE<=0.06    * STAPHYLOCOCCUS AUREUS  Blood Culture ID Panel (Reflexed)     Status: Abnormal   Collection Time: 03/04/22  1:52 PM  Result Value Ref Range Status   Enterococcus faecalis NOT DETECTED NOT DETECTED Final   Enterococcus Faecium NOT DETECTED NOT DETECTED Final   Listeria monocytogenes NOT DETECTED NOT DETECTED Final   Staphylococcus species NOT DETECTED NOT DETECTED Final   Staphylococcus aureus (BCID) NOT DETECTED NOT DETECTED Final   Staphylococcus epidermidis NOT DETECTED NOT DETECTED Final   Staphylococcus lugdunensis NOT DETECTED NOT DETECTED Final   Streptococcus species DETECTED (A) NOT DETECTED Final    Comment: CRITICAL RESULT CALLED TO, READ BACK BY AND VERIFIED WITH: JASON BELEUE $RemoveBefo'[email or web address]'[id number]$  ON 03/05/22 SKL    Streptococcus agalactiae DETECTED (A) NOT  DETECTED Final    Comment: CRITICAL RESULT CALLED TO, READ BACK BY AND VERIFIED WITH: JASON BELEUE $RemoveBefo'@0217'[id number]$  ON 03/05/22 SKL    Streptococcus pneumoniae NOT DETECTED NOT DETECTED Final   Streptococcus pyogenes NOT DETECTED NOT DETECTED Final   A.calcoaceticus-baumannii NOT DETECTED NOT DETECTED Final   Bacteroides fragilis NOT DETECTED NOT DETECTED Final   Enterobacterales NOT DETECTED NOT DETECTED Final   Enterobacter cloacae complex NOT DETECTED NOT DETECTED Final   Escherichia coli NOT DETECTED NOT DETECTED Final   Klebsiella aerogenes NOT DETECTED NOT DETECTED Final   Klebsiella oxytoca NOT DETECTED NOT DETECTED Final   Klebsiella pneumoniae NOT DETECTED NOT DETECTED Final   Proteus species NOT DETECTED NOT DETECTED Final   Salmonella species NOT DETECTED NOT DETECTED Final   Serratia marcescens NOT DETECTED NOT DETECTED Final   Haemophilus influenzae NOT DETECTED NOT DETECTED Final   Neisseria meningitidis NOT DETECTED NOT DETECTED Final   Pseudomonas aeruginosa NOT DETECTED NOT DETECTED Final   Stenotrophomonas maltophilia NOT DETECTED NOT DETECTED Final   Candida albicans NOT DETECTED NOT DETECTED Final   Candida auris NOT DETECTED NOT DETECTED Final   Candida glabrata NOT DETECTED NOT DETECTED Final   Candida krusei NOT DETECTED NOT DETECTED Final   Candida parapsilosis NOT DETECTED NOT DETECTED Final   Candida tropicalis NOT DETECTED NOT DETECTED Final   Cryptococcus neoformans/gattii NOT DETECTED NOT DETECTED Final    Comment: Performed at [facility], [address] [place], [place] [address]  Blood Culture (routine x 2)     Status: Abnormal   Collection Time: 03/04/22  1:57 PM   Specimen: BLOOD  Result Value Ref Range Status   Specimen Description BLOOD BLOOD RIGHT FOREARM  Final   Special Requests   Final    BOTTLES DRAWN AEROBIC AND ANAEROBIC Blood Culture results may not be optimal due to an inadequate volume of blood received in culture bottles   Culture   Setup Time   Final    GRAM POSITIVE COCCI IN BOTH AEROBIC AND ANAEROBIC BOTTLES CRITICAL VALUE NOTED.  VALUE IS CONSISTENT WITH PREVIOUSLY REPORTED AND CALLED VALUE.    Culture (  A)  Final    GROUP B STREP(S.AGALACTIAE)ISOLATED STAPHYLOCOCCUS AUREUS CRITICAL RESULT CALLED TO, READ BACK BY AND VERIFIED WITH: B. BIERS PHARMD, AT 4431 03/07/22 BY D. VANHOOK SUSCEPTIBILITIES PERFORMED ON PREVIOUS CULTURE WITHIN THE LAST 5 DAYS. Performed at [facility], [place] [address], [place], [place] [address]    Report Status 03/08/2022 FINAL  Final  Resp Panel by RT-PCR (Flu A&B, Covid) Anterior Nasal Swab     Status: None   Collection Time: 03/04/22  2:12 PM   Specimen: Anterior Nasal Swab  Result Value Ref Range Status   SARS Coronavirus 2 by RT PCR NEGATIVE NEGATIVE Final    Comment: (NOTE) SARS-CoV-2 target nucleic acids are NOT DETECTED.  The SARS-CoV-2 RNA is generally detectable in upper respiratory specimens during the acute phase of infection. The lowest concentration of SARS-CoV-2 viral copies this assay can detect is 138 copies/mL. A negative result does not preclude SARS-Cov-2 infection and should not be used as the sole basis for treatment or other patient management decisions. A negative result may occur with  improper specimen collection/handling, submission of specimen other than nasopharyngeal swab, presence of viral mutation(s) within the areas targeted by this assay, and inadequate number of viral copies(<138 copies/mL). A negative result must be combined with clinical observations, patient history, and epidemiological information. The expected result is Negative.  Fact Sheet for Patients:  EntrepreneurPulse.com.au  Fact Sheet for Healthcare Providers:  IncredibleEmployment.be  This test is no t yet approved or cleared by the [place] FDA and  has been authorized for detection and/or diagnosis of SARS-CoV-2 by FDA under an  Emergency Use Authorization (EUA). This EUA will remain  in effect (meaning this test can be used) for the duration of the COVID-19 declaration under Section 564(b)(1) of the Act, 21 U.S.C.section 360bbb-3(b)(1), unless the authorization is terminated  or revoked sooner.       Influenza A by PCR NEGATIVE NEGATIVE Final   Influenza B by PCR NEGATIVE NEGATIVE Final    Comment: (NOTE) The Xpert Xpress SARS-CoV-2/FLU/RSV plus assay is intended as an aid in the diagnosis of influenza from Nasopharyngeal swab specimens and should not be used as a sole basis for treatment. Nasal washings and aspirates are unacceptable for Xpert Xpress SARS-CoV-2/FLU/RSV testing.  Fact Sheet for Patients: EntrepreneurPulse.com.au  Fact Sheet for Healthcare Providers: IncredibleEmployment.be  This test is not yet approved or cleared by the [place] FDA and has been authorized for detection and/or diagnosis of SARS-CoV-2 by FDA under an Emergency Use Authorization (EUA). This EUA will remain in effect (meaning this test can be used) for the duration of the COVID-19 declaration under Section 564(b)(1) of the Act, 21 U.S.C. section 360bbb-3(b)(1), unless the authorization is terminated or revoked.  Performed at [facility], [place], [place] [address]   C Difficile Quick Screen w PCR reflex     Status: None   Collection Time: 03/04/22  9:21 PM   Specimen: Stool  Result Value Ref Range Status   C Diff antigen NEGATIVE NEGATIVE Final   C Diff toxin NEGATIVE NEGATIVE Final   C Diff interpretation No C. difficile detected.  Final    Comment: Performed at [facility], [address] [place],  [address]  Gastrointestinal Panel by PCR , Stool     Status: None   Collection Time: 03/04/22  9:21 PM   Specimen: Stool  Result Value Ref Range Status   Campylobacter species NOT DETECTED NOT DETECTED Final  Plesimonas  shigelloides NOT DETECTED NOT DETECTED Final   Salmonella species NOT DETECTED NOT DETECTED Final   Yersinia enterocolitica NOT DETECTED NOT DETECTED Final   Vibrio species NOT DETECTED NOT DETECTED Final   Vibrio cholerae NOT DETECTED NOT DETECTED Final   Enteroaggregative E coli (EAEC) NOT DETECTED NOT DETECTED Final   Enteropathogenic E coli (EPEC) NOT DETECTED NOT DETECTED Final   Enterotoxigenic E coli (ETEC) NOT DETECTED NOT DETECTED Final   Shiga like toxin producing E coli (STEC) NOT DETECTED NOT DETECTED Final   Shigella/Enteroinvasive E coli (EIEC) NOT DETECTED NOT DETECTED Final   Cryptosporidium NOT DETECTED NOT DETECTED Final   Cyclospora cayetanensis NOT DETECTED NOT DETECTED Final   Entamoeba histolytica NOT DETECTED NOT DETECTED Final   Giardia lamblia NOT DETECTED NOT DETECTED Final   Adenovirus F40/41 NOT DETECTED NOT DETECTED Final   Astrovirus NOT DETECTED NOT DETECTED Final   Norovirus GI/GII NOT DETECTED NOT DETECTED Final   Rotavirus A NOT DETECTED NOT DETECTED Final   Sapovirus (I, II, IV, and V) NOT DETECTED NOT DETECTED Final    Comment: Performed at [facility], [address], [place], [place] [address]  Urine Culture     Status: Abnormal   Collection Time: 03/04/22  9:39 PM   Specimen: In/Out Cath Urine  Result Value Ref Range Status   Specimen Description   Final    IN/OUT CATH URINE Performed at [facility], [address] [place], [place] [address]    Special Requests   Final    NONE Performed at [facility], [address] [place], [place] [address]    Culture MULTIPLE SPECIES PRESENT, SUGGEST RECOLLECTION (A)  Final   Report Status 03/07/2022 FINAL  Final  Culture, blood (Routine X 2) w Reflex to ID Panel     Status: None (Preliminary result)   Collection Time: 03/06/22  4:50 AM   Specimen: BLOOD  Result Value Ref Range Status   Specimen Description BLOOD RIGHT ANTECUBITAL  Final   Special Requests   Final     BOTTLES DRAWN AEROBIC AND ANAEROBIC Blood Culture adequate volume   Culture   Final    NO GROWTH 2 DAYS Performed at [facility], [address] [place], [place] [address]    Report Status PENDING  Incomplete  Culture, blood (Routine X 2) w Reflex to ID Panel     Status: None (Preliminary result)   Collection Time: 03/06/22  4:50 AM   Specimen: BLOOD  Result Value Ref Range Status   Specimen Description BLOOD BLOOD RIGHT WRIST  Final   Special Requests   Final    BOTTLES DRAWN AEROBIC AND ANAEROBIC Blood Culture adequate volume   Culture   Final    NO GROWTH 2 DAYS Performed at [facility], [address], [place], [place] [address]    Report Status PENDING  Incomplete         Radiology Studies: ECHOCARDIOGRAM COMPLETE  Result Date: 03/07/2022    ECHOCARDIOGRAM REPORT   Patient Name:   CLIMMIE BUELOW Date of Exam: 03/07/2022 Medical Rec #:  [id number]     Height:       67.0 in Accession #:    [id number]    Weight:       215.8 lb Date of Birth:  Oct 13, 1960    BSA:          2.089 m Patient Age:    60 years      BP:  158/94 mmHg Patient Gender: M             HR:           90 bpm. Exam Location:  [facility] Procedure: 2D Echo Indications:     Bacteremia  History:         Patient has prior history of Echocardiogram examinations. DCHF;                  Risk Factors:ERSD on HD, Dyslipidemia and Hypertension.  Sonographer:     L. Thornton-Maynard Referring Phys:  [id number] Tsosie Billing Diagnosing Phys: Buford Dresser MD IMPRESSIONS  1. Left ventricular ejection fraction, by estimation, is 45 to 50%. The left ventricle has mildly decreased function. The left ventricle demonstrates global hypokinesis. The left ventricular internal cavity size was mildly dilated. There is moderate concentric left ventricular hypertrophy. Left ventricular diastolic parameters are indeterminate.  2. Right ventricular systolic function is normal. The right ventricular size is mildly  enlarged. There is moderately elevated pulmonary artery systolic pressure. The estimated right ventricular systolic pressure is 98.2 mmHg.  3. Left atrial size was severely dilated.  4. Right atrial size was moderately dilated.  5. The mitral valve is degenerative. Mild to moderate mitral valve regurgitation. No evidence of mitral stenosis. Moderate to severe mitral annular calcification.  6. The aortic valve is tricuspid. There is mild calcification of the aortic valve. There is mild thickening of the aortic valve. Aortic valve regurgitation is not visualized. Aortic valve sclerosis/calcification is present, without any evidence of aortic stenosis.  7. Aortic dilatation noted. There is mild dilatation of the ascending aorta, measuring 40 mm.  8. The inferior vena cava is dilated in size with <50% respiratory variability, suggesting right atrial pressure of 15 mmHg. Comparison(s): Prior images reviewed side by side. Changes from prior study are noted. EF mildly reduced compared to prior TTE in 2020. Conclusion(s)/Recommendation(s): No evidence of valvular vegetations on this transthoracic echocardiogram. Consider a transesophageal echocardiogram to exclude infective endocarditis if clinically indicated. FINDINGS  Left Ventricle: Left ventricular ejection fraction, by estimation, is 45 to 50%. The left ventricle has mildly decreased function. The left ventricle demonstrates global hypokinesis. The left ventricular internal cavity size was mildly dilated. There is  moderate concentric left ventricular hypertrophy. Left ventricular diastolic parameters are indeterminate. Right Ventricle: The right ventricular size is mildly enlarged. No increase in right ventricular wall thickness. Right ventricular systolic function is normal. There is moderately elevated pulmonary artery systolic pressure. The tricuspid regurgitant velocity is 2.95 m/s, and with an assumed right atrial pressure of 15 mmHg, the estimated right  ventricular systolic pressure is 64.1 mmHg. Left Atrium: Left atrial size was severely dilated. Right Atrium: Right atrial size was moderately dilated. Pericardium: There is no evidence of pericardial effusion. Mitral Valve: The mitral valve is degenerative in appearance. Moderate to severe mitral annular calcification. Mild to moderate mitral valve regurgitation. No evidence of mitral valve stenosis. MV peak gradient, 17.5 mmHg. The mean mitral valve gradient is 8.0 mmHg. Tricuspid Valve: The tricuspid valve is normal in structure. Tricuspid valve regurgitation is mild . No evidence of tricuspid stenosis. Aortic Valve: The aortic valve is tricuspid. There is mild calcification of the aortic valve. There is mild thickening of the aortic valve. Aortic valve regurgitation is not visualized. Aortic valve sclerosis/calcification is present, without any evidence of aortic stenosis. Aortic valve mean gradient measures 4.0 mmHg. Aortic valve peak gradient measures 6.2 mmHg. Aortic valve area, by VTI measures 3.13 cm. Pulmonic Valve:  The pulmonic valve was grossly normal. Pulmonic valve regurgitation is trivial. No evidence of pulmonic stenosis. Aorta: Aortic dilatation noted. There is mild dilatation of the ascending aorta, measuring 40 mm. Venous: The inferior vena cava is dilated in size with less than 50% respiratory variability, suggesting right atrial pressure of 15 mmHg. IAS/Shunts: The atrial septum is grossly normal.  LEFT VENTRICLE PLAX 2D LVIDd:         5.41 cm      Diastology LVIDs:         4.38 cm      LV e' medial:    9.46 cm/s LV PW:         1.62 cm      LV E/e' medial:  11.0 LV IVS:        1.61 cm      LV e' lateral:   11.30 cm/s LVOT diam:     2.40 cm      LV E/e' lateral: 9.2 LV SV:         65 LV SV Index:   31 LVOT Area:     4.52 cm  LV Volumes (MOD) LV vol d, MOD A2C: 140.0 ml LV vol d, MOD A4C: 185.0 ml LV vol s, MOD A2C: 65.1 ml LV vol s, MOD A4C: 88.4 ml LV SV MOD A2C:     74.9 ml LV SV MOD A4C:      185.0 ml LV SV MOD BP:      86.0 ml RIGHT VENTRICLE RV S prime:     12.10 cm/s TAPSE (M-mode): 1.7 cm LEFT ATRIUM              Index        RIGHT ATRIUM           Index LA diam:        3.60 cm  1.72 cm/m   RA Area:     27.10 cm LA Vol (A2C):   145.0 ml 69.42 ml/m  RA Volume:   99.40 ml  47.59 ml/m LA Vol (A4C):   122.0 ml 58.41 ml/m LA Biplane Vol: 137.0 ml 65.59 ml/m  AORTIC VALVE                    PULMONIC VALVE AV Area (Vmax):    2.71 cm     PV Vmax:       0.62 m/s AV Area (Vmean):   2.80 cm     PV Peak grad:  1.6 mmHg AV Area (VTI):     3.13 cm AV Vmax:           125.00 cm/s AV Vmean:          87.900 cm/s AV VTI:            0.207 m AV Peak Grad:      6.2 mmHg AV Mean Grad:      4.0 mmHg LVOT Vmax:         75.00 cm/s LVOT Vmean:        54.400 cm/s LVOT VTI:          0.143 m LVOT/AV VTI ratio: 0.69  AORTA Ao Root diam: 3.90 cm Ao Asc diam:  4.00 cm MITRAL VALVE                TRICUSPID VALVE MV Area VTI:  1.82 cm      TR Peak grad:   34.8 mmHg MV Peak grad: 17.5 mmHg  TR Vmax:        295.00 cm/s MV Mean grad: 8.0 mmHg MV Vmax:      2.09 m/s      SHUNTS MV Vmean:     125.0 cm/s    Systemic VTI:  0.14 m MV E velocity: 104.00 cm/s  Systemic Diam: 2.40 cm MV A velocity: 188.00 cm/s MV E/A ratio:  0.55 Buford Dresser MD Electronically signed by Buford Dresser MD Signature Date/Time: 03/07/2022/12:29:05 PM    Final         Scheduled Meds:  amLODipine  10 mg Oral Daily   aspirin EC  81 mg Oral Daily   atorvastatin  10 mg Oral Daily   calcium acetate  667 mg Oral TID WC   carvedilol  6.25 mg Oral BID   Chlorhexidine Gluconate Cloth  6 each Topical Q0600   famotidine  20 mg Oral Daily   heparin  5,000 Units Subcutaneous Q8H   lisinopril  40 mg Oral Daily   Continuous Infusions:  [START ON 03/09/2022] vancomycin       LOS: 4 days    Time spent: 28 mins     Wyvonnia Dusky, MD [facility] Pager [phone]  If 7PM-7AM, please contact  night-coverage [email or web address] 03/08/2022, 8:05 AM

## 2022-03-08 NOTE — Progress Notes (Addendum)
[facility]  ROUNDING NOTE   Subjective:   Steve Vance is a 61 male with past medical history including hypertension, diastolic chronic heart failure, depression, hypertension, and end-stage renal disease on hemodialysis.  Patient presents to the emergency department from his dialysis clinic with complaints of shortness of breath and chest discomfort.  Patient has been admitted for Hypoxia [R09.02] Acute respiratory failure with hypoxia (HCC) [J96.01] Hypervolemia, unspecified hypervolemia type [E87.70] Dyspnea, unspecified type [R06.00] Community acquired pneumonia, unspecified laterality [J18.9] Congestive heart failure, unspecified HF chronicity, unspecified heart failure type ([place]) [I50.9]  Update:  Patient seen sitting up at side of bed, preparing for breakfast Family at bedside Continues to state he is ready for discharge No lower extremity edema Remains on room air Complains of generalized itching  Objective:  Vital signs in last 24 hours:  Temp:  [97.3 F (36.3 C)-98.6 F (37 C)] 97.3 F (36.3 C) (07/03 1200) Pulse Rate:  [88-94] 88 (07/03 1200) Resp:  [20-22] 20 (07/03 1200) BP: (152-181)/(83-96) 177/94 (07/03 1200) SpO2:  [97 %-100 %] 100 % (07/03 1200)  Weight change:  Filed Weights   03/06/22 0518 03/06/22 1432 03/06/22 1930  Weight: 100 kg 100 kg 97.9 kg    Intake/Output: I/O last 3 completed shifts: In: 400 [IV Piggyback:400] Out: -    Intake/Output this shift:  Total I/O In: 240 [P.O.:240] Out: -   Physical Exam: General: NAD  Head: Normocephalic, atraumatic. Moist oral mucosal membranes  Eyes: Anicteric  Lungs:  Clear bilateral, normal effort  Heart: Regular rate and rhythm  Abdomen:  Soft, nontender, obese  Extremities: No peripheral edema.  Neurologic: Nonfocal, moving all four extremities  Skin: No lesions  Access: Left aVF    Basic Metabolic Panel: Recent Labs  Lab 03/04/22 1412 03/05/22 0613 03/07/22 0804  03/08/22 0503  NA 135 136 137 135  K 5.5* 4.5 4.3 4.5  CL 98 97* 98 96*  CO2 20* $Remov'26 27 25  'nPzvAz$ GLUCOSE 100* 109* 83 89  BUN 39* 29* 38* 56*  CREATININE 8.30* 7.08* 7.36* 9.05*  CALCIUM 8.3* 8.5* 8.7* 8.9     Liver Function Tests: Recent Labs  Lab 03/04/22 1412  AST 23  ALT 20  ALKPHOS 85  BILITOT 1.1  PROT 8.6*  ALBUMIN 3.8    No results for input(s): "LIPASE", "AMYLASE" in the last 168 hours. No results for input(s): "AMMONIA" in the last 168 hours.  CBC: Recent Labs  Lab 03/04/22 1412 03/05/22 0613 03/07/22 0804 03/08/22 0503  WBC 5.6 8.6 5.3 5.4  NEUTROABS 5.1  --   --   --   HGB 12.3* 10.6* 11.0* 11.3*  HCT 40.2 34.1* 36.3* 36.4*  MCV 87.8 86.5 85.8 85.4  PLT 181 169 187 219     Cardiac Enzymes: No results for input(s): "CKTOTAL", "CKMB", "CKMBINDEX", "TROPONINI" in the last 168 hours.  BNP: Invalid input(s): "POCBNP"  CBG: No results for input(s): "GLUCAP" in the last 168 hours.  Microbiology: Results for orders placed or performed during the hospital encounter of 03/04/22  Blood Culture (routine x 2)     Status: Abnormal   Collection Time: 03/04/22  1:52 PM   Specimen: BLOOD  Result Value Ref Range Status   Specimen Description   Final    BLOOD RAC Performed at [facility], [address] [place], [place] [address]    Special Requests   Final    BOTTLES DRAWN AEROBIC AND ANAEROBIC BCAV Performed at [facility], [address] [place], [place] [address]    Culture  Setup Time   Final    Organism ID to follow GRAM POSITIVE COCCI IN BOTH AEROBIC AND ANAEROBIC BOTTLES CRITICAL RESULT CALLED TO, READ BACK BY AND VERIFIED WITH: JASON BELEUE $[email or web address]$  ON 03/05/22 SKL GRAM STAIN REVIEWED-AGREE WITH RESULT    Culture (A)  Final    GROUP B STREP(S.AGALACTIAE)ISOLATED STAPHYLOCOCCUS AUREUS CRITICAL RESULT CALLED TO, READ BACK BY AND VERIFIED WITH: B. BIERS PHARMD, AT [phone] 03/07/22 D. VANHOOK Performed at [facility],  [place] [address], [place], [place] [address]    Report Status 03/08/2022 FINAL  Final   Organism ID, Bacteria GROUP B STREP(S.AGALACTIAE)ISOLATED  Final   Organism ID, Bacteria STAPHYLOCOCCUS AUREUS  Final      Susceptibility   Group b strep(s.agalactiae)isolated - MIC*    CLINDAMYCIN <=0.25 SENSITIVE Sensitive     AMPICILLIN <=0.25 SENSITIVE Sensitive     ERYTHROMYCIN <=0.12 SENSITIVE Sensitive     VANCOMYCIN 0.5 SENSITIVE Sensitive     CEFTRIAXONE <=0.12 SENSITIVE Sensitive     LEVOFLOXACIN 1 SENSITIVE Sensitive     PENICILLIN Value in next row Sensitive      SENSITIVE<=0.06    * GROUP B STREP(S.AGALACTIAE)ISOLATED   Staphylococcus aureus - MIC*    CIPROFLOXACIN Value in next row Sensitive      SENSITIVE<=0.06    ERYTHROMYCIN Value in next row Sensitive      SENSITIVE<=0.06    GENTAMICIN Value in next row Sensitive      SENSITIVE<=0.06    OXACILLIN Value in next row Sensitive      SENSITIVE<=0.06    TETRACYCLINE Value in next row Sensitive      SENSITIVE<=0.06    VANCOMYCIN Value in next row Sensitive      SENSITIVE<=0.06    TRIMETH/SULFA Value in next row Sensitive      SENSITIVE<=0.06    CLINDAMYCIN Value in next row Sensitive      SENSITIVE<=0.06    RIFAMPIN Value in next row Sensitive      SENSITIVE<=0.06    Inducible Clindamycin Value in next row Sensitive      SENSITIVE<=0.06    * STAPHYLOCOCCUS AUREUS  Blood Culture ID Panel (Reflexed)     Status: Abnormal   Collection Time: 03/04/22  1:52 PM  Result Value Ref Range Status   Enterococcus faecalis NOT DETECTED NOT DETECTED Final   Enterococcus Faecium NOT DETECTED NOT DETECTED Final   Listeria monocytogenes NOT DETECTED NOT DETECTED Final   Staphylococcus species NOT DETECTED NOT DETECTED Final   Staphylococcus aureus (BCID) NOT DETECTED NOT DETECTED Final   Staphylococcus epidermidis NOT DETECTED NOT DETECTED Final   Staphylococcus lugdunensis NOT DETECTED NOT DETECTED Final   Streptococcus species DETECTED (A) NOT  DETECTED Final    Comment: CRITICAL RESULT CALLED TO, READ BACK BY AND VERIFIED WITH: JASON BELEUE $[email or web address]$  ON 03/05/22 SKL    Streptococcus agalactiae DETECTED (A) NOT DETECTED Final    Comment: CRITICAL RESULT CALLED TO, READ BACK BY AND VERIFIED WITH: JASON BELEUE $[email or web address]$  ON 03/05/22 SKL    Streptococcus pneumoniae NOT DETECTED NOT DETECTED Final   Streptococcus pyogenes NOT DETECTED NOT DETECTED Final   A.calcoaceticus-baumannii NOT DETECTED NOT DETECTED Final   Bacteroides fragilis NOT DETECTED NOT DETECTED Final   Enterobacterales NOT DETECTED NOT DETECTED Final   Enterobacter cloacae complex NOT DETECTED NOT DETECTED Final   Escherichia coli NOT DETECTED NOT DETECTED Final   Klebsiella aerogenes NOT DETECTED NOT DETECTED Final   Klebsiella oxytoca NOT DETECTED  NOT DETECTED Final   Klebsiella pneumoniae NOT DETECTED NOT DETECTED Final   Proteus species NOT DETECTED NOT DETECTED Final   Salmonella species NOT DETECTED NOT DETECTED Final   Serratia marcescens NOT DETECTED NOT DETECTED Final   Haemophilus influenzae NOT DETECTED NOT DETECTED Final   Neisseria meningitidis NOT DETECTED NOT DETECTED Final   Pseudomonas aeruginosa NOT DETECTED NOT DETECTED Final   Stenotrophomonas maltophilia NOT DETECTED NOT DETECTED Final   Candida albicans NOT DETECTED NOT DETECTED Final   Candida auris NOT DETECTED NOT DETECTED Final   Candida glabrata NOT DETECTED NOT DETECTED Final   Candida krusei NOT DETECTED NOT DETECTED Final   Candida parapsilosis NOT DETECTED NOT DETECTED Final   Candida tropicalis NOT DETECTED NOT DETECTED Final   Cryptococcus neoformans/gattii NOT DETECTED NOT DETECTED Final    Comment: Performed at [facility], [address] [place], [place] [address]  Blood Culture (routine x 2)     Status: Abnormal   Collection Time: 03/04/22  1:57 PM   Specimen: BLOOD  Result Value Ref Range Status   Specimen Description BLOOD BLOOD RIGHT FOREARM  Final   Special Requests    Final    BOTTLES DRAWN AEROBIC AND ANAEROBIC Blood Culture results may not be optimal due to an inadequate volume of blood received in culture bottles   Culture  Setup Time   Final    GRAM POSITIVE COCCI IN BOTH AEROBIC AND ANAEROBIC BOTTLES CRITICAL VALUE NOTED.  VALUE IS CONSISTENT WITH PREVIOUSLY REPORTED AND CALLED VALUE.    Culture (A)  Final    GROUP B STREP(S.AGALACTIAE)ISOLATED STAPHYLOCOCCUS AUREUS CRITICAL RESULT CALLED TO, READ BACK BY AND VERIFIED WITH: B. BIERS PHARMD, AT [phone] 03/07/22 BY D. VANHOOK SUSCEPTIBILITIES PERFORMED ON PREVIOUS CULTURE WITHIN THE LAST 5 DAYS. Performed at [facility], [address], [place], [place] [address]    Report Status 03/08/2022 FINAL  Final  Resp Panel by RT-PCR (Flu A&B, Covid) Anterior Nasal Swab     Status: None   Collection Time: 03/04/22  2:12 PM   Specimen: Anterior Nasal Swab  Result Value Ref Range Status   SARS Coronavirus 2 by RT PCR NEGATIVE NEGATIVE Final    Comment: (NOTE) SARS-CoV-2 target nucleic acids are NOT DETECTED.  The SARS-CoV-2 RNA is generally detectable in upper respiratory specimens during the acute phase of infection. The lowest concentration of SARS-CoV-2 viral copies this assay can detect is 138 copies/mL. A negative result does not preclude SARS-Cov-2 infection and should not be used as the sole basis for treatment or other patient management decisions. A negative result may occur with  improper specimen collection/handling, submission of specimen other than nasopharyngeal swab, presence of viral mutation(s) within the areas targeted by this assay, and inadequate number of viral copies(<138 copies/mL). A negative result must be combined with clinical observations, patient history, and epidemiological information. The expected result is Negative.  Fact Sheet for Patients:  BloggerCourse.com  Fact Sheet for Healthcare Providers:   SeriousBroker.it  This test is no t yet approved or cleared by the [place] FDA and  has been authorized for detection and/or diagnosis of SARS-CoV-2 by FDA under an Emergency Use Authorization (EUA). This EUA will remain  in effect (meaning this test can be used) for the duration of the COVID-19 declaration under Section 564(b)(1) of the Act, 21 U.S.C.section 360bbb-3(b)(1), unless the authorization is terminated  or revoked sooner.       Influenza A by PCR NEGATIVE NEGATIVE Final  Influenza B by PCR NEGATIVE NEGATIVE Final    Comment: (NOTE) The Xpert Xpress SARS-CoV-2/FLU/RSV plus assay is intended as an aid in the diagnosis of influenza from Nasopharyngeal swab specimens and should not be used as a sole basis for treatment. Nasal washings and aspirates are unacceptable for Xpert Xpress SARS-CoV-2/FLU/RSV testing.  Fact Sheet for Patients: EntrepreneurPulse.com.au  Fact Sheet for Healthcare Providers: IncredibleEmployment.be  This test is not yet approved or cleared by the [place] FDA and has been authorized for detection and/or diagnosis of SARS-CoV-2 by FDA under an Emergency Use Authorization (EUA). This EUA will remain in effect (meaning this test can be used) for the duration of the COVID-19 declaration under Section 564(b)(1) of the Act, 21 U.S.C. section 360bbb-3(b)(1), unless the authorization is terminated or revoked.  Performed at [facility], [place], [place] [address]   C Difficile Quick Screen w PCR reflex     Status: None   Collection Time: 03/04/22  9:21 PM   Specimen: Stool  Result Value Ref Range Status   C Diff antigen NEGATIVE NEGATIVE Final   C Diff toxin NEGATIVE NEGATIVE Final   C Diff interpretation No C. difficile detected.  Final    Comment: Performed at [facility], [address] [place], [place] [address]  Gastrointestinal Panel  by PCR , Stool     Status: None   Collection Time: 03/04/22  9:21 PM   Specimen: Stool  Result Value Ref Range Status   Campylobacter species NOT DETECTED NOT DETECTED Final   Plesimonas shigelloides NOT DETECTED NOT DETECTED Final   Salmonella species NOT DETECTED NOT DETECTED Final   Yersinia enterocolitica NOT DETECTED NOT DETECTED Final   Vibrio species NOT DETECTED NOT DETECTED Final   Vibrio cholerae NOT DETECTED NOT DETECTED Final   Enteroaggregative E coli (EAEC) NOT DETECTED NOT DETECTED Final   Enteropathogenic E coli (EPEC) NOT DETECTED NOT DETECTED Final   Enterotoxigenic E coli (ETEC) NOT DETECTED NOT DETECTED Final   Shiga like toxin producing E coli (STEC) NOT DETECTED NOT DETECTED Final   Shigella/Enteroinvasive E coli (EIEC) NOT DETECTED NOT DETECTED Final   Cryptosporidium NOT DETECTED NOT DETECTED Final   Cyclospora cayetanensis NOT DETECTED NOT DETECTED Final   Entamoeba histolytica NOT DETECTED NOT DETECTED Final   Giardia lamblia NOT DETECTED NOT DETECTED Final   Adenovirus F40/41 NOT DETECTED NOT DETECTED Final   Astrovirus NOT DETECTED NOT DETECTED Final   Norovirus GI/GII NOT DETECTED NOT DETECTED Final   Rotavirus A NOT DETECTED NOT DETECTED Final   Sapovirus (I, II, IV, and V) NOT DETECTED NOT DETECTED Final    Comment: Performed at [facility], [address] [place], [place] [address]  Urine Culture     Status: Abnormal   Collection Time: 03/04/22  9:39 PM   Specimen: In/Out Cath Urine  Result Value Ref Range Status   Specimen Description   Final    IN/OUT CATH URINE Performed at [facility], [address], [place], [place] [address]    Special Requests   Final    NONE Performed at [facility], [address] [place], [place] [address]    Culture MULTIPLE SPECIES PRESENT, SUGGEST RECOLLECTION (A)  Final   Report Status 03/07/2022 FINAL  Final  Culture, blood (Routine X 2) w Reflex to ID Panel     Status: None  (Preliminary result)   Collection Time: 03/06/22  4:50 AM   Specimen: BLOOD  Result Value  Ref Range Status   Specimen Description BLOOD RIGHT ANTECUBITAL  Final   Special Requests   Final    BOTTLES DRAWN AEROBIC AND ANAEROBIC Blood Culture adequate volume   Culture   Final    NO GROWTH 2 DAYS Performed at [facility], [address] [place] [address], [place], [place] [address]    Report Status PENDING  Incomplete  Culture, blood (Routine X 2) w Reflex to ID Panel     Status: None (Preliminary result)   Collection Time: 03/06/22  4:50 AM   Specimen: BLOOD  Result Value Ref Range Status   Specimen Description BLOOD BLOOD RIGHT WRIST  Final   Special Requests   Final    BOTTLES DRAWN AEROBIC AND ANAEROBIC Blood Culture adequate volume   Culture   Final    NO GROWTH 2 DAYS Performed at [facility], [address], [place], [place] [address]    Report Status PENDING  Incomplete    Coagulation Studies: No results for input(s): "LABPROT", "INR" in the last 72 hours.   Urinalysis: No results for input(s): "COLORURINE", "LABSPEC", "PHURINE", "GLUCOSEU", "HGBUR", "BILIRUBINUR", "KETONESUR", "PROTEINUR", "UROBILINOGEN", "NITRITE", "LEUKOCYTESUR" in the last 72 hours.  Invalid input(s): "APPERANCEUR"     Imaging: ECHOCARDIOGRAM COMPLETE  Result Date: 03/07/2022    ECHOCARDIOGRAM REPORT   Patient Name:   Steve Vance Date of Exam: 03/07/2022 Medical Rec #:  [id number]     Height:       67.0 in Accession #:    [id number]    Weight:       215.8 lb Date of Birth:  [birth year]    BSA:          2.089 m Patient Age:    16 years      BP:           158/94 mmHg Patient Gender: M             HR:           90 bpm. Exam Location:  [facility] Procedure: 2D Echo Indications:     Bacteremia  History:         Patient has prior history of Echocardiogram examinations. DCHF;                  Risk Factors:ERSD on HD, Dyslipidemia and Hypertension.  Sonographer:     L. Thornton-Maynard Referring Phys:  [id number]  Tsosie Billing Diagnosing Phys: Buford Dresser MD IMPRESSIONS  1. Left ventricular ejection fraction, by estimation, is 45 to 50%. The left ventricle has mildly decreased function. The left ventricle demonstrates global hypokinesis. The left ventricular internal cavity size was mildly dilated. There is moderate concentric left ventricular hypertrophy. Left ventricular diastolic parameters are indeterminate.  2. Right ventricular systolic function is normal. The right ventricular size is mildly enlarged. There is moderately elevated pulmonary artery systolic pressure. The estimated right ventricular systolic pressure is 24.4 mmHg.  3. Left atrial size was severely dilated.  4. Right atrial size was moderately dilated.  5. The mitral valve is degenerative. Mild to moderate mitral valve regurgitation. No evidence of mitral stenosis. Moderate to severe mitral annular calcification.  6. The aortic valve is tricuspid. There is mild calcification of the aortic valve. There is mild thickening of the aortic valve. Aortic valve regurgitation is not visualized. Aortic valve sclerosis/calcification is present, without any evidence of aortic stenosis.  7. Aortic dilatation noted. There is mild dilatation of the ascending aorta, measuring 40 mm.  8. The inferior vena cava  is dilated in size with <50% respiratory variability, suggesting right atrial pressure of 15 mmHg. Comparison(s): Prior images reviewed side by side. Changes from prior study are noted. EF mildly reduced compared to prior TTE in 2020. Conclusion(s)/Recommendation(s): No evidence of valvular vegetations on this transthoracic echocardiogram. Consider a transesophageal echocardiogram to exclude infective endocarditis if clinically indicated. FINDINGS  Left Ventricle: Left ventricular ejection fraction, by estimation, is 45 to 50%. The left ventricle has mildly decreased function. The left ventricle demonstrates global hypokinesis. The left ventricular  internal cavity size was mildly dilated. There is  moderate concentric left ventricular hypertrophy. Left ventricular diastolic parameters are indeterminate. Right Ventricle: The right ventricular size is mildly enlarged. No increase in right ventricular wall thickness. Right ventricular systolic function is normal. There is moderately elevated pulmonary artery systolic pressure. The tricuspid regurgitant velocity is 2.95 m/s, and with an assumed right atrial pressure of 15 mmHg, the estimated right ventricular systolic pressure is 01.0 mmHg. Left Atrium: Left atrial size was severely dilated. Right Atrium: Right atrial size was moderately dilated. Pericardium: There is no evidence of pericardial effusion. Mitral Valve: The mitral valve is degenerative in appearance. Moderate to severe mitral annular calcification. Mild to moderate mitral valve regurgitation. No evidence of mitral valve stenosis. MV peak gradient, 17.5 mmHg. The mean mitral valve gradient is 8.0 mmHg. Tricuspid Valve: The tricuspid valve is normal in structure. Tricuspid valve regurgitation is mild . No evidence of tricuspid stenosis. Aortic Valve: The aortic valve is tricuspid. There is mild calcification of the aortic valve. There is mild thickening of the aortic valve. Aortic valve regurgitation is not visualized. Aortic valve sclerosis/calcification is present, without any evidence of aortic stenosis. Aortic valve mean gradient measures 4.0 mmHg. Aortic valve peak gradient measures 6.2 mmHg. Aortic valve area, by VTI measures 3.13 cm. Pulmonic Valve: The pulmonic valve was grossly normal. Pulmonic valve regurgitation is trivial. No evidence of pulmonic stenosis. Aorta: Aortic dilatation noted. There is mild dilatation of the ascending aorta, measuring 40 mm. Venous: The inferior vena cava is dilated in size with less than 50% respiratory variability, suggesting right atrial pressure of 15 mmHg. IAS/Shunts: The atrial septum is grossly normal.   LEFT VENTRICLE PLAX 2D LVIDd:         5.41 cm      Diastology LVIDs:         4.38 cm      LV e' medial:    9.46 cm/s LV PW:         1.62 cm      LV E/e' medial:  11.0 LV IVS:        1.61 cm      LV e' lateral:   11.30 cm/s LVOT diam:     2.40 cm      LV E/e' lateral: 9.2 LV SV:         65 LV SV Index:   31 LVOT Area:     4.52 cm  LV Volumes (MOD) LV vol d, MOD A2C: 140.0 ml LV vol d, MOD A4C: 185.0 ml LV vol s, MOD A2C: 65.1 ml LV vol s, MOD A4C: 88.4 ml LV SV MOD A2C:     74.9 ml LV SV MOD A4C:     185.0 ml LV SV MOD BP:      86.0 ml RIGHT VENTRICLE RV S prime:     12.10 cm/s TAPSE (M-mode): 1.7 cm LEFT ATRIUM  Index        RIGHT ATRIUM           Index LA diam:        3.60 cm  1.72 cm/m   RA Area:     27.10 cm LA Vol (A2C):   145.0 ml 69.42 ml/m  RA Volume:   99.40 ml  47.59 ml/m LA Vol (A4C):   122.0 ml 58.41 ml/m LA Biplane Vol: 137.0 ml 65.59 ml/m  AORTIC VALVE                    PULMONIC VALVE AV Area (Vmax):    2.71 cm     PV Vmax:       0.62 m/s AV Area (Vmean):   2.80 cm     PV Peak grad:  1.6 mmHg AV Area (VTI):     3.13 cm AV Vmax:           125.00 cm/s AV Vmean:          87.900 cm/s AV VTI:            0.207 m AV Peak Grad:      6.2 mmHg AV Mean Grad:      4.0 mmHg LVOT Vmax:         75.00 cm/s LVOT Vmean:        54.400 cm/s LVOT VTI:          0.143 m LVOT/AV VTI ratio: 0.69  AORTA Ao Root diam: 3.90 cm Ao Asc diam:  4.00 cm MITRAL VALVE                TRICUSPID VALVE MV Area VTI:  1.82 cm      TR Peak grad:   34.8 mmHg MV Peak grad: 17.5 mmHg     TR Vmax:        295.00 cm/s MV Mean grad: 8.0 mmHg MV Vmax:      2.09 m/s      SHUNTS MV Vmean:     125.0 cm/s    Systemic VTI:  0.14 m MV E velocity: 104.00 cm/s  Systemic Diam: 2.40 cm MV A velocity: 188.00 cm/s MV E/A ratio:  0.55 Buford Dresser MD Electronically signed by Buford Dresser MD Signature Date/Time: 03/07/2022/12:29:05 PM    Final      Medications:     ceFAZolin (ANCEF) IV     [START ON 03/09/2022]  ceFAZolin  (ANCEF) IV     And   [START ON 03/13/2022]  ceFAZolin (ANCEF) IV      amLODipine  10 mg Oral Daily   aspirin EC  81 mg Oral Daily   atorvastatin  10 mg Oral Daily   calcium acetate  667 mg Oral TID WC   carvedilol  6.25 mg Oral BID   Chlorhexidine Gluconate Cloth  6 each Topical Q0600   famotidine  20 mg Oral Daily   heparin  5,000 Units Subcutaneous Q8H   lisinopril  40 mg Oral Daily   acetaminophen, albuterol, camphor-menthol, dextromethorphan-guaiFENesin, hydrALAZINE, ondansetron (ZOFRAN) IV  Assessment/ Plan:  Steve Vance is a 61 y.o.  male past medical history including hypertension, diastolic chronic heart failure, depression, hypertension, and end-stage renal disease on hemodialysis.  Patient presents to the emergency department from his dialysis clinic with complaints of shortness of breath and chest discomfort.  Patient has been admitted for Hypoxia [R09.02] Acute respiratory failure with hypoxia (HCC) [J96.01] Hypervolemia, unspecified hypervolemia type [E87.70] Dyspnea, unspecified type [R06.00]  Community acquired pneumonia, unspecified laterality [J18.9] Congestive heart failure, unspecified HF chronicity, unspecified heart failure type ([place]) [I50.9]  [facility] TTS [place] Left AVF 107.5kg  End-stage renal disease on HD TTS.    Next treatment scheduled for Tuesday.  Monitoring discharge plan to likely require antibiotics to be given during dialysis treatments.  2. Anemia of chronic kidney disease Lab Results  Component Value Date   HGB 11.3 (L) 03/08/2022  Hemoglobin remains within acceptable range.  Continue to monitor.  3. Secondary Hyperparathyroidism:  Lab Results  Component Value Date   PTH 301 (H) 09/24/2020   CALCIUM 8.9 03/08/2022   CAION 1.21 06/27/2019   PHOS 6.2 (H) 08/14/2021    We will recheck labs tomorrow during dialysis.  Continue calcium acetate with meals.  Itching can be a result of hyperphosphatemia, will order Sarna lotion and  recommend continuation of use after discharge.  4.  Hypertension with chronic kidney disease.  Home regimen includes amlodipine, carvedilol, hydralazine, hydrochlorothiazide and lisinopril.  Currently receiving amlodipine, carvedilol, and lisinopril.  Current blood pressure 177/94.  5.  Sepsis/bacteremia.  Initially group B strep was going.  It appears that Staph aureus also growing now.  Infectious disease following.  Vancomycin added to regimen.  Will continue to monitor for need for continued antibiotic therapy beyond discharge.  Currently receiving vancomycin 1 g IV with each dialysis treatment.   LOS: 4   7/3/20231:42 PM

## 2022-03-08 NOTE — Consult Note (Signed)
Pharmacy Antibiotic Note  Steve Vance is a 61 y.o. male admitted on 03/04/2022 with GBS + MSSA bacteremia.  Pharmacy has been consulted for cefazoling dosing. Patient with hx of ESRD on HD TTSa outpatient.   Plan: Discontinue Vancomycin Initiate Cefazolin with HD: 2 gram Tues, Thurs and 3 gram every Saturday Plan for TEE 03/10/22  Height: $Remove'5\' 7"'uvfwtCu$  (170.2 cm) Weight: 97.9 kg (215 lb 13.3 oz) IBW/kg (Calculated) : 66.1  Temp (24hrs), Avg:98.1 F (36.7 C), Min:97.7 F (36.5 C), Max:98.6 F (37 C)  Recent Labs  Lab 03/04/22 1412 03/05/22 0613 03/07/22 0804 03/08/22 0503  WBC 5.6 8.6 5.3 5.4  CREATININE 8.30* 7.08* 7.36* 9.05*  LATICACIDVEN 1.8  --   --   --     Estimated Creatinine Clearance: 9.4 mL/min (A) (by C-G formula based on SCr of 9.05 mg/dL (H)).    Allergies  Allergen Reactions   Baclofen Nausea And Vomiting   Gabapentin Nausea And Vomiting   Oxycodone-Acetaminophen Nausea And Vomiting    Antimicrobials this admission: 6/30 Cefazolin >>  7/2 Vancomycin x 1   Dose adjustments this admission: N/a  Microbiology results: 6/29 BCx: 4/4: GBS and 4/4 MSSA 7/1 Bcx: NGTD 6/29 UCx: multiple species    Thank you for allowing pharmacy to be a part of this patient's care.  Dorothe Pea, PharmD, BCPS Clinical Pharmacist   03/08/2022 11:59 AM

## 2022-03-08 NOTE — Progress Notes (Addendum)
   [facility] has been requested to perform a transesophageal echocardiogram on Steve Vance for Group B streptococcus bacteremia at the request of infectious disease.  After careful review of history and examination, the risks and benefits of transesophageal echocardiogram have been explained including risks of esophageal damage, perforation (1:10,000 risk), bleeding, pharyngeal hematoma as well as other potential complications associated with anesthesia including aspiration, arrhythmia, respiratory failure and death. Alternatives to treatment were discussed, questions were answered. Patient is willing to proceed.   Plan for TEE 03/10/2022.   2D echo 03/07/2022: 1. Left ventricular ejection fraction, by estimation, is 45 to 50%. The  left ventricle has mildly decreased function. The left ventricle  demonstrates global hypokinesis. The left ventricular internal cavity size  was mildly dilated. There is moderate  concentric left ventricular hypertrophy. Left ventricular diastolic  parameters are indeterminate.   2. Right ventricular systolic function is normal. The right ventricular  size is mildly enlarged. There is moderately elevated pulmonary artery  systolic pressure. The estimated right ventricular systolic pressure is  01.2 mmHg.   3. Left atrial size was severely dilated.   4. Right atrial size was moderately dilated.   5. The mitral valve is degenerative. Mild to moderate mitral valve  regurgitation. No evidence of mitral stenosis. Moderate to severe mitral  annular calcification.   6. The aortic valve is tricuspid. There is mild calcification of the  aortic valve. There is mild thickening of the aortic valve. Aortic valve  regurgitation is not visualized. Aortic valve sclerosis/calcification is  present, without any evidence of  aortic stenosis.   7. Aortic dilatation noted. There is mild dilatation of the ascending  aorta, measuring 40 mm.   8. The inferior vena cava is  dilated in size with <50% respiratory  variability, suggesting right atrial pressure of 15 mmHg.   Christell Faith, PA-C 03/08/2022 1:01 PM

## 2022-03-08 NOTE — Care Management Important Message (Signed)
Important Message  Patient Details  Name: Steve Vance MRN: [id number] Date of Birth: 1961/03/20   Medicare Important Message Given:  Yes     Dannette Barbara 03/08/2022, 12:02 PM

## 2022-03-08 NOTE — Progress Notes (Addendum)
Date of Admission:  03/04/2022     ID: Steve Vance is a 61 y.o. male  Principal Problem:   Acute respiratory failure with hypoxia ([place]) Active Problems:   Essential hypertension   Hyperlipidemia   ESRD on dialysis (HCC)   Volume overload   Chronic diastolic CHF (congestive heart failure) (HCC)   Hyperkalemia   Diarrhea    Subjective: Pt feeling better Partner at bed side Says back pain is better than before   Medications:   amLODipine  10 mg Oral Daily   aspirin EC  81 mg Oral Daily   atorvastatin  10 mg Oral Daily   calcium acetate  667 mg Oral TID WC   carvedilol  6.25 mg Oral BID   Chlorhexidine Gluconate Cloth  6 each Topical Q0600   famotidine  20 mg Oral Daily   heparin  5,000 Units Subcutaneous Q8H   lisinopril  40 mg Oral Daily    Objective: Vital signs in last 24 hours: Temp:  [97.7 F (36.5 C)-98.6 F (37 C)] 97.9 F (36.6 C) (07/03 0800) Pulse Rate:  [89-94] 91 (07/03 0800) Resp:  [20-22] 22 (07/03 0800) BP: (152-181)/(83-96) 175/96 (07/03 0800) SpO2:  [97 %-100 %] 97 % (07/03 0800)    PHYSICAL EXAM:  General: Alert, cooperative, no distress, appears stated age.  Lungs: Clear to auscultation bilaterally. No Wheezing or Rhonchi. No rales. Heart: Regular rate and rhythm, no murmur, rub or gallop. Abdomen: Soft, non-tender,not distended. Bowel sounds normal. No masses Extremities: left AVF Skin: hyperpigmented papules and scars Lymph: Cervical, supraclavicular normal. Neurologic: Grossly non-focal  Lab Results Recent Labs    03/07/22 0804 03/08/22 0503  WBC 5.3 5.4  HGB 11.0* 11.3*  HCT 36.3* 36.4*  NA 137 135  K 4.3 4.5  CL 98 96*  CO2 27 25  BUN 38* 56*  CREATININE 7.36* 9.05*  Microbiology: 03/04/22 BC- GBS MSSA 03/06/22 BC sent Studies/Results: ECHOCARDIOGRAM COMPLETE  Result Date: 03/07/2022    ECHOCARDIOGRAM REPORT   Patient Name:   Steve Vance Date of Exam: 03/07/2022 Medical Rec #:  [id number]     Height:       67.0 in  Accession #:    [id number]    Weight:       215.8 lb Date of Birth:  April 12, 1961    BSA:          2.089 m Patient Age:    36 years      BP:           158/94 mmHg Patient Gender: M             HR:           90 bpm. Exam Location:  [facility] Procedure: 2D Echo Indications:     Bacteremia  History:         Patient has prior history of Echocardiogram examinations. DCHF;                  Risk Factors:ERSD on HD, Dyslipidemia and Hypertension.  Sonographer:     L. Thornton-Maynard Referring Phys:  [id number] Tsosie Billing Diagnosing Phys: Buford Dresser MD IMPRESSIONS  1. Left ventricular ejection fraction, by estimation, is 45 to 50%. The left ventricle has mildly decreased function. The left ventricle demonstrates global hypokinesis. The left ventricular internal cavity size was mildly dilated. There is moderate concentric left ventricular hypertrophy. Left ventricular diastolic parameters are indeterminate.  2. Right ventricular systolic function is normal. The right ventricular size is mildly  enlarged. There is moderately elevated pulmonary artery systolic pressure. The estimated right ventricular systolic pressure is 42.3 mmHg.  3. Left atrial size was severely dilated.  4. Right atrial size was moderately dilated.  5. The mitral valve is degenerative. Mild to moderate mitral valve regurgitation. No evidence of mitral stenosis. Moderate to severe mitral annular calcification.  6. The aortic valve is tricuspid. There is mild calcification of the aortic valve. There is mild thickening of the aortic valve. Aortic valve regurgitation is not visualized. Aortic valve sclerosis/calcification is present, without any evidence of aortic stenosis.  7. Aortic dilatation noted. There is mild dilatation of the ascending aorta, measuring 40 mm.  8. The inferior vena cava is dilated in size with <50% respiratory variability, suggesting right atrial pressure of 15 mmHg. Comparison(s): Prior images reviewed side by side.  Changes from prior study are noted. EF mildly reduced compared to prior TTE in 2020. Conclusion(s)/Recommendation(s): No evidence of valvular vegetations on this transthoracic echocardiogram. Consider a transesophageal echocardiogram to exclude infective endocarditis if clinically indicated. FINDINGS  Left Ventricle: Left ventricular ejection fraction, by estimation, is 45 to 50%. The left ventricle has mildly decreased function. The left ventricle demonstrates global hypokinesis. The left ventricular internal cavity size was mildly dilated. There is  moderate concentric left ventricular hypertrophy. Left ventricular diastolic parameters are indeterminate. Right Ventricle: The right ventricular size is mildly enlarged. No increase in right ventricular wall thickness. Right ventricular systolic function is normal. There is moderately elevated pulmonary artery systolic pressure. The tricuspid regurgitant velocity is 2.95 m/s, and with an assumed right atrial pressure of 15 mmHg, the estimated right ventricular systolic pressure is 53.6 mmHg. Left Atrium: Left atrial size was severely dilated. Right Atrium: Right atrial size was moderately dilated. Pericardium: There is no evidence of pericardial effusion. Mitral Valve: The mitral valve is degenerative in appearance. Moderate to severe mitral annular calcification. Mild to moderate mitral valve regurgitation. No evidence of mitral valve stenosis. MV peak gradient, 17.5 mmHg. The mean mitral valve gradient is 8.0 mmHg. Tricuspid Valve: The tricuspid valve is normal in structure. Tricuspid valve regurgitation is mild . No evidence of tricuspid stenosis. Aortic Valve: The aortic valve is tricuspid. There is mild calcification of the aortic valve. There is mild thickening of the aortic valve. Aortic valve regurgitation is not visualized. Aortic valve sclerosis/calcification is present, without any evidence of aortic stenosis. Aortic valve mean gradient measures 4.0 mmHg.  Aortic valve peak gradient measures 6.2 mmHg. Aortic valve area, by VTI measures 3.13 cm. Pulmonic Valve: The pulmonic valve was grossly normal. Pulmonic valve regurgitation is trivial. No evidence of pulmonic stenosis. Aorta: Aortic dilatation noted. There is mild dilatation of the ascending aorta, measuring 40 mm. Venous: The inferior vena cava is dilated in size with less than 50% respiratory variability, suggesting right atrial pressure of 15 mmHg. IAS/Shunts: The atrial septum is grossly normal.  LEFT VENTRICLE PLAX 2D LVIDd:         5.41 cm      Diastology LVIDs:         4.38 cm      LV e' medial:    9.46 cm/s LV PW:         1.62 cm      LV E/e' medial:  11.0 LV IVS:        1.61 cm      LV e' lateral:   11.30 cm/s LVOT diam:     2.40 cm      LV E/e'  lateral: 9.2 LV SV:         65 LV SV Index:   31 LVOT Area:     4.52 cm  LV Volumes (MOD) LV vol d, MOD A2C: 140.0 ml LV vol d, MOD A4C: 185.0 ml LV vol s, MOD A2C: 65.1 ml LV vol s, MOD A4C: 88.4 ml LV SV MOD A2C:     74.9 ml LV SV MOD A4C:     185.0 ml LV SV MOD BP:      86.0 ml RIGHT VENTRICLE RV S prime:     12.10 cm/s TAPSE (M-mode): 1.7 cm LEFT ATRIUM              Index        RIGHT ATRIUM           Index LA diam:        3.60 cm  1.72 cm/m   RA Area:     27.10 cm LA Vol (A2C):   145.0 ml 69.42 ml/m  RA Volume:   99.40 ml  47.59 ml/m LA Vol (A4C):   122.0 ml 58.41 ml/m LA Biplane Vol: 137.0 ml 65.59 ml/m  AORTIC VALVE                    PULMONIC VALVE AV Area (Vmax):    2.71 cm     PV Vmax:       0.62 m/s AV Area (Vmean):   2.80 cm     PV Peak grad:  1.6 mmHg AV Area (VTI):     3.13 cm AV Vmax:           125.00 cm/s AV Vmean:          87.900 cm/s AV VTI:            0.207 m AV Peak Grad:      6.2 mmHg AV Mean Grad:      4.0 mmHg LVOT Vmax:         75.00 cm/s LVOT Vmean:        54.400 cm/s LVOT VTI:          0.143 m LVOT/AV VTI ratio: 0.69  AORTA Ao Root diam: 3.90 cm Ao Asc diam:  4.00 cm MITRAL VALVE                TRICUSPID VALVE MV Area VTI:  1.82  cm      TR Peak grad:   34.8 mmHg MV Peak grad: 17.5 mmHg     TR Vmax:        295.00 cm/s MV Mean grad: 8.0 mmHg MV Vmax:      2.09 m/s      SHUNTS MV Vmean:     125.0 cm/s    Systemic VTI:  0.14 m MV E velocity: 104.00 cm/s  Systemic Diam: 2.40 cm MV A velocity: 188.00 cm/s MV E/A ratio:  0.55 Buford Dresser MD Electronically signed by Buford Dresser MD Signature Date/Time: 03/07/2022/12:29:05 PM    Final      Assessment/Plan:  MSSA and GBS bacteremia- with AVF / recurrent SOB concern for endocarditis- need TEE With back apin need to r/o discitis/ osteo- will get MRI and the thoracic and lumbar spine Change vancomycin to cefazolin to cover the b=above 2 bacteria  CHF- improved  Anemia HEPC - no treated yet-    Discussed the management with patient, partner and care team

## 2022-03-09 DIAGNOSIS — Z992 Dependence on renal dialysis: Secondary | ICD-10-CM | POA: Diagnosis not present

## 2022-03-09 DIAGNOSIS — E669 Obesity, unspecified: Secondary | ICD-10-CM | POA: Diagnosis not present

## 2022-03-09 DIAGNOSIS — R7881 Bacteremia: Secondary | ICD-10-CM | POA: Diagnosis not present

## 2022-03-09 DIAGNOSIS — N186 End stage renal disease: Secondary | ICD-10-CM | POA: Diagnosis not present

## 2022-03-09 LAB — CBC
HCT: 37 % — ABNORMAL LOW (ref 39.0–52.0)
Hemoglobin: 11.6 g/dL — ABNORMAL LOW (ref 13.0–17.0)
MCH: 26.5 pg (ref 26.0–34.0)
MCHC: 31.4 g/dL (ref 30.0–36.0)
MCV: 84.5 fL (ref 80.0–100.0)
Platelets: 209 10*3/uL (ref 150–400)
RBC: 4.38 MIL/uL (ref 4.22–5.81)
RDW: 17.7 % — ABNORMAL HIGH (ref 11.5–15.5)
WBC: 5.3 10*3/uL (ref 4.0–10.5)
nRBC: 0 % (ref 0.0–0.2)

## 2022-03-09 LAB — BASIC METABOLIC PANEL
Anion gap: 15 (ref 5–15)
BUN: 68 mg/dL — ABNORMAL HIGH (ref 8–23)
CO2: 22 mmol/L (ref 22–32)
Calcium: 8.9 mg/dL (ref 8.9–10.3)
Chloride: 96 mmol/L — ABNORMAL LOW (ref 98–111)
Creatinine, Ser: 10.83 mg/dL — ABNORMAL HIGH (ref 0.61–1.24)
GFR, Estimated: 5 mL/min — ABNORMAL LOW (ref >60)
Glucose, Bld: 118 mg/dL — ABNORMAL HIGH (ref 70–99)
Potassium: 4.4 mmol/L (ref 3.5–5.1)
Sodium: 133 mmol/L — ABNORMAL LOW (ref 135–145)

## 2022-03-09 LAB — C-REACTIVE PROTEIN: CRP: 1.5 mg/dL — ABNORMAL HIGH (ref <1.0)

## 2022-03-09 LAB — SEDIMENTATION RATE: Sed Rate: 45 mm/hr — ABNORMAL HIGH (ref 0–20)

## 2022-03-09 MED ORDER — SODIUM CHLORIDE 0.9 % IV SOLN: INTRAVENOUS | Status: DC

## 2022-03-09 NOTE — Progress Notes (Signed)
PROGRESS NOTE   HPI was taken from Dr. Blaine Hamper: Steve Vance is a 61 y.o. male with medical history significant of ESRD-HD (TTS), HTN, HLD, chronic pain syndrome, cervicalgia, dCHF, depression, who presents with shortness of breath.   Patient states that he went to the dialysis today. He developed shortness of breath at about 1 hours after started HD.  His shortness breath has been progressively worsening.  Patient was found to have severe respiratory distress, using accessory muscle for breathing.  Patient was started on 5 L oxygen.  Normally patient is not using oxygen at home.  Patient denies chest pain.  Patient has cough with little mucus production.  Per EMS report, patient had fever 100.  His body temperature is 98.2 in the ED.  Patient states he has been having diarrhea in the past several days, he has several episodes of watery diarrhea each day.  He has nausea, dry heaves, no abdominal pain.  Denies symptoms of UTI.     Data Reviewed and ED Course: pt was found to have WBC 5.6, lactic acid 1.8, procalcitonin 0.34, negative COVID PCR, potassium 5.5, bicarbonate 20, creatinine 8.3, BUN 39.  Temperature 98.2, blood pressure 148/85, heart rate 108, RR 22.  VBG with pH 7.41, CO2 42, O2 53.  Chest x-ray showed new infiltration bilaterally, left is worse than the right.  Patient is admitted to PCU as inpatient.  Dr. Holley Raring of renal was consulted for dialysis.   As per Dr. Jimmye Norman 6/30-03/09/22: Pt was found to have bacteremia w/ strep agalacitae & stap aureus. TTE did not show any vegetations and ID wants a TEE. TEE tomorrow AM as per [facility] cardio. MRI of T/S did not show any abscesses. Pt is on IV cefazolin as per ID. Repeat blood cxs NGTD      Steve Vance  JJK:[id number] DOB: 03-05-1961 DOA: 03/04/2022 PCP: Theotis Burrow, MD  Assessment & Plan:   Principal Problem:   Acute respiratory failure with hypoxia ([place]) Active Problems:   Volume overload   Essential hypertension   ESRD  on dialysis ([place])   Hyperlipidemia   Chronic diastolic CHF (congestive heart failure) (HCC)   Hyperkalemia   Diarrhea  Assessment and Plan: Acute hypoxic respiratory failure: likely secondary to fluid overload & possible pneumonia.  Resolved   Possible pneumonia: as per CXR. Continue on IV cefazolin, bronchodilators and encourage incentive spirometry   Bacteremia: blood cxs growing strep agalactiae & stap aureus, sens pending. Continue on IV cefazolin as per ID. Repeat blood cxs NGTD. Echo shows EF 37-16%, diastolic function are indeterminate, LV demonstrates global hypokinesis, no vegetations mentioned. TEE tomorrow AM w/ [facility] cardio    Volume overload: resolved    HTN: poorly controlled. Continue on coreg, lisinopril, amlodipine & imdur (imdur started this admission). IV hydralazine prn     ESRD: on HD TTS. Nephro recs apprec   ACD: likely secondary to ESRD. H&H are stable    Diarrhea: etiology unclear, possibly abx related. C. diff, GI PCR panel were both neg    Hyperkalemia: resolved    Chronic diastolic CHF: echo on 96/78/9381 showed EF of 55 to 60%. Volume management w/ HD    HLD: continue on statin   Obesity: BMI 33.7. Complicates overall care & prognosis      DVT prophylaxis: heparin  Code Status: full  Family Communication: discussed pt's care w/ pt's family at bedside and answered their questions  Disposition Plan: likely d/c back home   Level of care: Progressive  Status  is: Inpatient Remains inpatient appropriate because: severity of illness  Consultants:  ID cardio  Procedures:   Antimicrobials: cefazolin    Subjective: Pt c/o malaise   Objective: Vitals:   03/08/22 2012 03/09/22 0125 03/09/22 0434 03/09/22 0740  BP: (!) 182/97 (!) 162/88 (!) 162/92 (!) 166/97  Pulse: 93 94 91 92  Resp: $Remo'20  20 18  'DmJpq$ Temp: (!) 97.5 F (36.4 C)  98 F (36.7 C) 98.1 F (36.7 C)  TempSrc: Oral  Oral   SpO2: 100% 100% 98% 98%  Weight:   100.4 kg   Height:         Intake/Output Summary (Last 24 hours) at 03/09/2022 0811 Last data filed at 03/08/2022 1800 Gross per 24 hour  Intake 660 ml  Output 150 ml  Net 510 ml   Filed Weights   03/06/22 1432 03/06/22 1930 03/09/22 0434  Weight: 100 kg 97.9 kg 100.4 kg    Examination:  General exam: Appears calm & comfortable  Respiratory system: decreased breath sounds b/l  Cardiovascular system: S1 & S2+. No rubs or clicks Gastrointestinal system: Abd is soft, NT, obese &  normal bowel sounds  Central nervous system: alert and oriented. Moves all extremities   Psychiatry: Judgement and insight appears at baseline. Appropriate mood and affect     Data Reviewed: I have personally reviewed following labs and imaging studies  CBC: Recent Labs  Lab 03/04/22 1412 03/05/22 0613 03/07/22 0804 03/08/22 0503 03/09/22 0617  WBC 5.6 8.6 5.3 5.4 5.3  NEUTROABS 5.1  --   --   --   --   HGB 12.3* 10.6* 11.0* 11.3* 11.6*  HCT 40.2 34.1* 36.3* 36.4* 37.0*  MCV 87.8 86.5 85.8 85.4 84.5  PLT 181 169 187 219 867   Basic Metabolic Panel: Recent Labs  Lab 03/04/22 1412 03/05/22 0613 03/07/22 0804 03/08/22 0503 03/09/22 0617  NA 135 136 137 135 133*  K 5.5* 4.5 4.3 4.5 4.4  CL 98 97* 98 96* 96*  CO2 20* $Remov'26 27 25 22  'guntKE$ GLUCOSE 100* 109* 83 89 118*  BUN 39* 29* 38* 56* 68*  CREATININE 8.30* 7.08* 7.36* 9.05* 10.83*  CALCIUM 8.3* 8.5* 8.7* 8.9 8.9   GFR: Estimated Creatinine Clearance: 8 mL/min (A) (by C-G formula based on SCr of 10.83 mg/dL (H)). Liver Function Tests: Recent Labs  Lab 03/04/22 1412  AST 23  ALT 20  ALKPHOS 85  BILITOT 1.1  PROT 8.6*  ALBUMIN 3.8   No results for input(s): "LIPASE", "AMYLASE" in the last 168 hours. No results for input(s): "AMMONIA" in the last 168 hours. Coagulation Profile: Recent Labs  Lab 03/04/22 1412  INR 1.1   Cardiac Enzymes: No results for input(s): "CKTOTAL", "CKMB", "CKMBINDEX", "TROPONINI" in the last 168 hours. BNP (last 3 results) No  results for input(s): "PROBNP" in the last 8760 hours. HbA1C: No results for input(s): "HGBA1C" in the last 72 hours. CBG: No results for input(s): "GLUCAP" in the last 168 hours. Lipid Profile: No results for input(s): "CHOL", "HDL", "LDLCALC", "TRIG", "CHOLHDL", "LDLDIRECT" in the last 72 hours. Thyroid Function Tests: No results for input(s): "TSH", "T4TOTAL", "FREET4", "T3FREE", "THYROIDAB" in the last 72 hours. Anemia Panel: No results for input(s): "VITAMINB12", "FOLATE", "FERRITIN", "TIBC", "IRON", "RETICCTPCT" in the last 72 hours. Sepsis Labs: Recent Labs  Lab 03/04/22 1412  PROCALCITON 0.34  LATICACIDVEN 1.8    Recent Results (from the past 240 hour(s))  Blood Culture (routine x 2)     Status: Abnormal  Collection Time: 03/04/22  1:52 PM   Specimen: BLOOD  Result Value Ref Range Status   Specimen Description   Final    BLOOD RAC Performed at [facility], [address], [place], [place] [address]    Special Requests   Final    BOTTLES DRAWN AEROBIC AND ANAEROBIC BCAV Performed at [facility], [address] [place], [place] [address]    Culture  Setup Time   Final    Organism ID to follow GRAM POSITIVE COCCI IN BOTH AEROBIC AND ANAEROBIC BOTTLES CRITICAL RESULT CALLED TO, READ BACK BY AND VERIFIED WITH: JASON BELEUE $RemoveBefo'[id number]$  ON 03/05/22 SKL GRAM STAIN REVIEWED-AGREE WITH RESULT    Culture (A)  Final    GROUP B STREP(S.AGALACTIAE)ISOLATED STAPHYLOCOCCUS AUREUS CRITICAL RESULT CALLED TO, READ BACK BY AND VERIFIED WITH: B. BIERS PHARMD, AT 0802 03/07/22 D. VANHOOK Performed at [facility], [place] [address], [place], [place] [address]    Report Status 03/08/2022 FINAL  Final   Organism ID, Bacteria GROUP B STREP(S.AGALACTIAE)ISOLATED  Final   Organism ID, Bacteria STAPHYLOCOCCUS AUREUS  Final      Susceptibility   Group b strep(s.agalactiae)isolated - MIC*    CLINDAMYCIN <=0.25 SENSITIVE Sensitive     AMPICILLIN <=0.25 SENSITIVE  Sensitive     ERYTHROMYCIN <=0.12 SENSITIVE Sensitive     VANCOMYCIN 0.5 SENSITIVE Sensitive     CEFTRIAXONE <=0.12 SENSITIVE Sensitive     LEVOFLOXACIN 1 SENSITIVE Sensitive     PENICILLIN Value in next row Sensitive      SENSITIVE<=0.06    * GROUP B STREP(S.AGALACTIAE)ISOLATED   Staphylococcus aureus - MIC*    CIPROFLOXACIN Value in next row Sensitive      SENSITIVE<=0.06    ERYTHROMYCIN Value in next row Sensitive      SENSITIVE<=0.06    GENTAMICIN Value in next row Sensitive      SENSITIVE<=0.06    OXACILLIN Value in next row Sensitive      SENSITIVE<=0.06    TETRACYCLINE Value in next row Sensitive      SENSITIVE<=0.06    VANCOMYCIN Value in next row Sensitive      SENSITIVE<=0.06    TRIMETH/SULFA Value in next row Sensitive      SENSITIVE<=0.06    CLINDAMYCIN Value in next row Sensitive      SENSITIVE<=0.06    RIFAMPIN Value in next row Sensitive      SENSITIVE<=0.06    Inducible Clindamycin Value in next row Sensitive      SENSITIVE<=0.06    * STAPHYLOCOCCUS AUREUS  Blood Culture ID Panel (Reflexed)     Status: Abnormal   Collection Time: 03/04/22  1:52 PM  Result Value Ref Range Status   Enterococcus faecalis NOT DETECTED NOT DETECTED Final   Enterococcus Faecium NOT DETECTED NOT DETECTED Final   Listeria monocytogenes NOT DETECTED NOT DETECTED Final   Staphylococcus species NOT DETECTED NOT DETECTED Final   Staphylococcus aureus (BCID) NOT DETECTED NOT DETECTED Final   Staphylococcus epidermidis NOT DETECTED NOT DETECTED Final   Staphylococcus lugdunensis NOT DETECTED NOT DETECTED Final   Streptococcus species DETECTED (A) NOT DETECTED Final    Comment: CRITICAL RESULT CALLED TO, READ BACK BY AND VERIFIED WITH: JASON BELEUE $RemoveBefo'@0217'[id number]$  ON 03/05/22 SKL    Streptococcus agalactiae DETECTED (A) NOT DETECTED Final    Comment: CRITICAL RESULT CALLED TO, READ BACK BY AND VERIFIED WITH: JASON BELEUE $RemoveBefo'@0217'[id number]$  ON 03/05/22 SKL    Streptococcus pneumoniae NOT DETECTED NOT DETECTED  Final   Streptococcus  pyogenes NOT DETECTED NOT DETECTED Final   A.calcoaceticus-baumannii NOT DETECTED NOT DETECTED Final   Bacteroides fragilis NOT DETECTED NOT DETECTED Final   Enterobacterales NOT DETECTED NOT DETECTED Final   Enterobacter cloacae complex NOT DETECTED NOT DETECTED Final   Escherichia coli NOT DETECTED NOT DETECTED Final   Klebsiella aerogenes NOT DETECTED NOT DETECTED Final   Klebsiella oxytoca NOT DETECTED NOT DETECTED Final   Klebsiella pneumoniae NOT DETECTED NOT DETECTED Final   Proteus species NOT DETECTED NOT DETECTED Final   Salmonella species NOT DETECTED NOT DETECTED Final   Serratia marcescens NOT DETECTED NOT DETECTED Final   Haemophilus influenzae NOT DETECTED NOT DETECTED Final   Neisseria meningitidis NOT DETECTED NOT DETECTED Final   Pseudomonas aeruginosa NOT DETECTED NOT DETECTED Final   Stenotrophomonas maltophilia NOT DETECTED NOT DETECTED Final   Candida albicans NOT DETECTED NOT DETECTED Final   Candida auris NOT DETECTED NOT DETECTED Final   Candida glabrata NOT DETECTED NOT DETECTED Final   Candida krusei NOT DETECTED NOT DETECTED Final   Candida parapsilosis NOT DETECTED NOT DETECTED Final   Candida tropicalis NOT DETECTED NOT DETECTED Final   Cryptococcus neoformans/gattii NOT DETECTED NOT DETECTED Final    Comment: Performed at [facility], [address] [place], [place] [address]  Blood Culture (routine x 2)     Status: Abnormal   Collection Time: 03/04/22  1:57 PM   Specimen: BLOOD  Result Value Ref Range Status   Specimen Description BLOOD BLOOD RIGHT FOREARM  Final   Special Requests   Final    BOTTLES DRAWN AEROBIC AND ANAEROBIC Blood Culture results may not be optimal due to an inadequate volume of blood received in culture bottles   Culture  Setup Time   Final    GRAM POSITIVE COCCI IN BOTH AEROBIC AND ANAEROBIC BOTTLES CRITICAL VALUE NOTED.  VALUE IS CONSISTENT WITH PREVIOUSLY REPORTED AND CALLED VALUE.     Culture (A)  Final    GROUP B STREP(S.AGALACTIAE)ISOLATED STAPHYLOCOCCUS AUREUS CRITICAL RESULT CALLED TO, READ BACK BY AND VERIFIED WITH: B. BIERS PHARMD, AT 6568 03/07/22 BY D. VANHOOK SUSCEPTIBILITIES PERFORMED ON PREVIOUS CULTURE WITHIN THE LAST 5 DAYS. Performed at [facility], [place] [address] [place], [place] [address]    Report Status 03/08/2022 FINAL  Final  Resp Panel by RT-PCR (Flu A&B, Covid) Anterior Nasal Swab     Status: None   Collection Time: 03/04/22  2:12 PM   Specimen: Anterior Nasal Swab  Result Value Ref Range Status   SARS Coronavirus 2 by RT PCR NEGATIVE NEGATIVE Final    Comment: (NOTE) SARS-CoV-2 target nucleic acids are NOT DETECTED.  The SARS-CoV-2 RNA is generally detectable in upper respiratory specimens during the acute phase of infection. The lowest concentration of SARS-CoV-2 viral copies this assay can detect is 138 copies/mL. A negative result does not preclude SARS-Cov-2 infection and should not be used as the sole basis for treatment or other patient management decisions. A negative result may occur with  improper specimen collection/handling, submission of specimen other than nasopharyngeal swab, presence of viral mutation(s) within the areas targeted by this assay, and inadequate number of viral copies(<138 copies/mL). A negative result must be combined with clinical observations, patient history, and epidemiological information. The expected result is Negative.  Fact Sheet for Patients:  EntrepreneurPulse.com.au  Fact Sheet for Healthcare Providers:  IncredibleEmployment.be  This test is no t yet approved or cleared by the [place] FDA and  has been authorized for detection and/or  diagnosis of SARS-CoV-2 by FDA under an Emergency Use Authorization (EUA). This EUA will remain  in effect (meaning this test can be used) for the duration of the COVID-19 declaration under Section 564(b)(1) of the  Act, 21 U.S.C.section 360bbb-3(b)(1), unless the authorization is terminated  or revoked sooner.       Influenza A by PCR NEGATIVE NEGATIVE Final   Influenza B by PCR NEGATIVE NEGATIVE Final    Comment: (NOTE) The Xpert Xpress SARS-CoV-2/FLU/RSV plus assay is intended as an aid in the diagnosis of influenza from Nasopharyngeal swab specimens and should not be used as a sole basis for treatment. Nasal washings and aspirates are unacceptable for Xpert Xpress SARS-CoV-2/FLU/RSV testing.  Fact Sheet for Patients: EntrepreneurPulse.com.au  Fact Sheet for Healthcare Providers: IncredibleEmployment.be  This test is not yet approved or cleared by the [place] FDA and has been authorized for detection and/or diagnosis of SARS-CoV-2 by FDA under an Emergency Use Authorization (EUA). This EUA will remain in effect (meaning this test can be used) for the duration of the COVID-19 declaration under Section 564(b)(1) of the Act, 21 U.S.C. section 360bbb-3(b)(1), unless the authorization is terminated or revoked.  Performed at [facility], [place], [place] [address]   C Difficile Quick Screen w PCR reflex     Status: None   Collection Time: 03/04/22  9:21 PM   Specimen: Stool  Result Value Ref Range Status   C Diff antigen NEGATIVE NEGATIVE Final   C Diff toxin NEGATIVE NEGATIVE Final   C Diff interpretation No C. difficile detected.  Final    Comment: Performed at [facility], [address] [place], [place] [address]  Gastrointestinal Panel by PCR , Stool     Status: None   Collection Time: 03/04/22  9:21 PM   Specimen: Stool  Result Value Ref Range Status   Campylobacter species NOT DETECTED NOT DETECTED Final   Plesimonas shigelloides NOT DETECTED NOT DETECTED Final   Salmonella species NOT DETECTED NOT DETECTED Final   Yersinia enterocolitica NOT DETECTED NOT DETECTED Final   Vibrio species NOT  DETECTED NOT DETECTED Final   Vibrio cholerae NOT DETECTED NOT DETECTED Final   Enteroaggregative E coli (EAEC) NOT DETECTED NOT DETECTED Final   Enteropathogenic E coli (EPEC) NOT DETECTED NOT DETECTED Final   Enterotoxigenic E coli (ETEC) NOT DETECTED NOT DETECTED Final   Shiga like toxin producing E coli (STEC) NOT DETECTED NOT DETECTED Final   Shigella/Enteroinvasive E coli (EIEC) NOT DETECTED NOT DETECTED Final   Cryptosporidium NOT DETECTED NOT DETECTED Final   Cyclospora cayetanensis NOT DETECTED NOT DETECTED Final   Entamoeba histolytica NOT DETECTED NOT DETECTED Final   Giardia lamblia NOT DETECTED NOT DETECTED Final   Adenovirus F40/41 NOT DETECTED NOT DETECTED Final   Astrovirus NOT DETECTED NOT DETECTED Final   Norovirus GI/GII NOT DETECTED NOT DETECTED Final   Rotavirus A NOT DETECTED NOT DETECTED Final   Sapovirus (I, II, IV, and V) NOT DETECTED NOT DETECTED Final    Comment: Performed at [facility], [address] [place], [place] [address]  Urine Culture     Status: Abnormal   Collection Time: 03/04/22  9:39 PM   Specimen: In/Out Cath Urine  Result Value Ref Range Status   Specimen Description   Final    IN/OUT CATH URINE Performed at [facility], [address], [place], [place] [address]    Special Requests   Final    NONE Performed at  [facility], [address] [place], [place] [address]    Culture MULTIPLE SPECIES PRESENT, SUGGEST RECOLLECTION (A)  Final   Report Status 03/07/2022 FINAL  Final  Culture, blood (Routine X 2) w Reflex to ID Panel     Status: None (Preliminary result)   Collection Time: 03/06/22  4:50 AM   Specimen: BLOOD  Result Value Ref Range Status   Specimen Description BLOOD RIGHT ANTECUBITAL  Final   Special Requests   Final    BOTTLES DRAWN AEROBIC AND ANAEROBIC Blood Culture adequate volume   Culture   Final    NO GROWTH 3 DAYS Performed at [facility], [address] [place],  [place] [address]    Report Status PENDING  Incomplete  Culture, blood (Routine X 2) w Reflex to ID Panel     Status: None (Preliminary result)   Collection Time: 03/06/22  4:50 AM   Specimen: BLOOD  Result Value Ref Range Status   Specimen Description BLOOD BLOOD RIGHT WRIST  Final   Special Requests   Final    BOTTLES DRAWN AEROBIC AND ANAEROBIC Blood Culture adequate volume   Culture   Final    NO GROWTH 3 DAYS Performed at [facility], [address], [place], [place] [address]    Report Status PENDING  Incomplete         Radiology Studies: MR THORACIC SPINE W WO CONTRAST  Result Date: 03/09/2022 CLINICAL DATA:  Possible osteomyelitis. EXAM: MRI THORACIC WITHOUT AND WITH CONTRAST TECHNIQUE: Multiplanar and multiecho pulse sequences of the thoracic spine were obtained without and with intravenous contrast. CONTRAST:  17mL GADAVIST GADOBUTROL 1 MMOL/ML IV SOLN COMPARISON:  None Available. FINDINGS: Alignment:  Physiologic. Vertebrae: No fracture, evidence of discitis, or bone lesion. Cord:  Normal signal and morphology. No Epidural collection. Paraspinal and other soft tissues: Negative. Disc levels: No spinal canal stenosis. IMPRESSION: No discitis-osteomyelitis or epidural collection. Electronically Signed   By: Ulyses Jarred M.D.   On: 03/09/2022 00:07   MR LUMBAR SPINE WO CONTRAST  Result Date: 03/08/2022 CLINICAL DATA:  Potential osteomyelitis EXAM: MRI LUMBAR SPINE WITHOUT CONTRAST TECHNIQUE: Multiplanar, multisequence MR imaging of the lumbar spine was performed. No intravenous contrast was administered. COMPARISON:  None Available. FINDINGS: Images are markedly degraded by motion. Segmentation: Standard Alignment:  Normal Vertebrae:  No fracture, evidence of discitis, or bone lesion. Conus medullaris and cauda equina: Conus extends to the L1 level. Conus and cauda equina appear normal. Paraspinal and other soft tissues: Negative Disc levels: L1-L2: Normal disc space and facet  joints. No spinal canal stenosis. No neural foraminal stenosis. L2-L3: Medium-sized disc bulge with endplate spurring. Worsened severe spinal canal stenosis. No neural foraminal stenosis. L3-L4: Medium-sized disc bulge with superimposed small central protrusion. Worsened moderate spinal canal stenosis. No neural foraminal stenosis. L4-L5: Medium-sized disc bulge. Unchanged mild spinal canal stenosis. No neural foraminal stenosis. L5-S1: Medium-sized disc bulge. No spinal canal stenosis. Unchanged severe bilateral neural foraminal stenosis. Visualized sacrum: Normal. IMPRESSION: 1. Markedly motion degraded examination. 2. Worsened severe spinal canal stenosis at L2-L3. 3. Worsened moderate L3-4 spinal canal stenosis. 4. Unchanged severe bilateral L5-S1 neural foraminal stenosis. Electronically Signed   By: Ulyses Jarred M.D.   On: 03/08/2022 23:59   ECHOCARDIOGRAM COMPLETE  Result Date: 03/07/2022    ECHOCARDIOGRAM REPORT   Patient Name:   ENDER RORKE Date of Exam: 03/07/2022 Medical Rec #:  [id number]     Height:       67.0 in Accession #:  [id number]    Weight:       215.8 lb Date of Birth:  May 18, 1961    BSA:          2.089 m Patient Age:    58 years      BP:           158/94 mmHg Patient Gender: M             HR:           90 bpm. Exam Location:  [facility] Procedure: 2D Echo Indications:     Bacteremia  History:         Patient has prior history of Echocardiogram examinations. DCHF;                  Risk Factors:ERSD on HD, Dyslipidemia and Hypertension.  Sonographer:     L. Thornton-Maynard Referring Phys:  [id number] Tsosie Billing Diagnosing Phys: Buford Dresser MD IMPRESSIONS  1. Left ventricular ejection fraction, by estimation, is 45 to 50%. The left ventricle has mildly decreased function. The left ventricle demonstrates global hypokinesis. The left ventricular internal cavity size was mildly dilated. There is moderate concentric left ventricular hypertrophy. Left ventricular diastolic  parameters are indeterminate.  2. Right ventricular systolic function is normal. The right ventricular size is mildly enlarged. There is moderately elevated pulmonary artery systolic pressure. The estimated right ventricular systolic pressure is 47.8 mmHg.  3. Left atrial size was severely dilated.  4. Right atrial size was moderately dilated.  5. The mitral valve is degenerative. Mild to moderate mitral valve regurgitation. No evidence of mitral stenosis. Moderate to severe mitral annular calcification.  6. The aortic valve is tricuspid. There is mild calcification of the aortic valve. There is mild thickening of the aortic valve. Aortic valve regurgitation is not visualized. Aortic valve sclerosis/calcification is present, without any evidence of aortic stenosis.  7. Aortic dilatation noted. There is mild dilatation of the ascending aorta, measuring 40 mm.  8. The inferior vena cava is dilated in size with <50% respiratory variability, suggesting right atrial pressure of 15 mmHg. Comparison(s): Prior images reviewed side by side. Changes from prior study are noted. EF mildly reduced compared to prior TTE in 2020. Conclusion(s)/Recommendation(s): No evidence of valvular vegetations on this transthoracic echocardiogram. Consider a transesophageal echocardiogram to exclude infective endocarditis if clinically indicated. FINDINGS  Left Ventricle: Left ventricular ejection fraction, by estimation, is 45 to 50%. The left ventricle has mildly decreased function. The left ventricle demonstrates global hypokinesis. The left ventricular internal cavity size was mildly dilated. There is  moderate concentric left ventricular hypertrophy. Left ventricular diastolic parameters are indeterminate. Right Ventricle: The right ventricular size is mildly enlarged. No increase in right ventricular wall thickness. Right ventricular systolic function is normal. There is moderately elevated pulmonary artery systolic pressure. The  tricuspid regurgitant velocity is 2.95 m/s, and with an assumed right atrial pressure of 15 mmHg, the estimated right ventricular systolic pressure is 29.5 mmHg. Left Atrium: Left atrial size was severely dilated. Right Atrium: Right atrial size was moderately dilated. Pericardium: There is no evidence of pericardial effusion. Mitral Valve: The mitral valve is degenerative in appearance. Moderate to severe mitral annular calcification. Mild to moderate mitral valve regurgitation. No evidence of mitral valve stenosis. MV peak gradient, 17.5 mmHg. The mean mitral valve gradient is 8.0 mmHg. Tricuspid Valve: The tricuspid valve is normal in structure. Tricuspid valve regurgitation is mild . No evidence of tricuspid stenosis. Aortic Valve: The aortic valve is tricuspid. There  is mild calcification of the aortic valve. There is mild thickening of the aortic valve. Aortic valve regurgitation is not visualized. Aortic valve sclerosis/calcification is present, without any evidence of aortic stenosis. Aortic valve mean gradient measures 4.0 mmHg. Aortic valve peak gradient measures 6.2 mmHg. Aortic valve area, by VTI measures 3.13 cm. Pulmonic Valve: The pulmonic valve was grossly normal. Pulmonic valve regurgitation is trivial. No evidence of pulmonic stenosis. Aorta: Aortic dilatation noted. There is mild dilatation of the ascending aorta, measuring 40 mm. Venous: The inferior vena cava is dilated in size with less than 50% respiratory variability, suggesting right atrial pressure of 15 mmHg. IAS/Shunts: The atrial septum is grossly normal.  LEFT VENTRICLE PLAX 2D LVIDd:         5.41 cm      Diastology LVIDs:         4.38 cm      LV e' medial:    9.46 cm/s LV PW:         1.62 cm      LV E/e' medial:  11.0 LV IVS:        1.61 cm      LV e' lateral:   11.30 cm/s LVOT diam:     2.40 cm      LV E/e' lateral: 9.2 LV SV:         65 LV SV Index:   31 LVOT Area:     4.52 cm  LV Volumes (MOD) LV vol d, MOD A2C: 140.0 ml LV vol d,  MOD A4C: 185.0 ml LV vol s, MOD A2C: 65.1 ml LV vol s, MOD A4C: 88.4 ml LV SV MOD A2C:     74.9 ml LV SV MOD A4C:     185.0 ml LV SV MOD BP:      86.0 ml RIGHT VENTRICLE RV S prime:     12.10 cm/s TAPSE (M-mode): 1.7 cm LEFT ATRIUM              Index        RIGHT ATRIUM           Index LA diam:        3.60 cm  1.72 cm/m   RA Area:     27.10 cm LA Vol (A2C):   145.0 ml 69.42 ml/m  RA Volume:   99.40 ml  47.59 ml/m LA Vol (A4C):   122.0 ml 58.41 ml/m LA Biplane Vol: 137.0 ml 65.59 ml/m  AORTIC VALVE                    PULMONIC VALVE AV Area (Vmax):    2.71 cm     PV Vmax:       0.62 m/s AV Area (Vmean):   2.80 cm     PV Peak grad:  1.6 mmHg AV Area (VTI):     3.13 cm AV Vmax:           125.00 cm/s AV Vmean:          87.900 cm/s AV VTI:            0.207 m AV Peak Grad:      6.2 mmHg AV Mean Grad:      4.0 mmHg LVOT Vmax:         75.00 cm/s LVOT Vmean:        54.400 cm/s LVOT VTI:          0.143 m LVOT/AV VTI ratio: 0.69  AORTA Ao  Root diam: 3.90 cm Ao Asc diam:  4.00 cm MITRAL VALVE                TRICUSPID VALVE MV Area VTI:  1.82 cm      TR Peak grad:   34.8 mmHg MV Peak grad: 17.5 mmHg     TR Vmax:        295.00 cm/s MV Mean grad: 8.0 mmHg MV Vmax:      2.09 m/s      SHUNTS MV Vmean:     125.0 cm/s    Systemic VTI:  0.14 m MV E velocity: 104.00 cm/s  Systemic Diam: 2.40 cm MV A velocity: 188.00 cm/s MV E/A ratio:  0.55 Buford Dresser MD Electronically signed by Buford Dresser MD Signature Date/Time: 03/07/2022/12:29:05 PM    Final         Scheduled Meds:  amLODipine  10 mg Oral Daily   aspirin EC  81 mg Oral Daily   atorvastatin  10 mg Oral Daily   calcium acetate  667 mg Oral TID WC   carvedilol  6.25 mg Oral BID   Chlorhexidine Gluconate Cloth  6 each Topical Q0600   famotidine  20 mg Oral Daily   heparin  5,000 Units Subcutaneous Q8H   isosorbide mononitrate  30 mg Oral Daily   lisinopril  40 mg Oral Daily   Continuous Infusions:   ceFAZolin (ANCEF) IV     And   [START  ON 03/13/2022]  ceFAZolin (ANCEF) IV     promethazine (PHENERGAN) injection (IM or IVPB) 12.5 mg (03/08/22 1919)     LOS: 5 days    Time spent: 25 mins     Wyvonnia Dusky, MD [facility] Pager [phone]  If 7PM-7AM, please contact night-coverage [email or web address] 03/09/2022, 8:11 AM

## 2022-03-09 NOTE — Progress Notes (Signed)
[facility]  ROUNDING NOTE   Subjective:   Steve Vance is a 61 male with past medical history including hypertension, diastolic chronic heart failure, depression, hypertension, and end-stage renal disease on hemodialysis.  Patient presents to the emergency department from his dialysis clinic with complaints of shortness of breath and chest discomfort.  Patient has been admitted for Hypoxia [R09.02] Acute respiratory failure with hypoxia (HCC) [J96.01] Hypervolemia, unspecified hypervolemia type [E87.70] Dyspnea, unspecified type [R06.00] Community acquired pneumonia, unspecified laterality [J18.9] Congestive heart failure, unspecified HF chronicity, unspecified heart failure type ([place]) [I50.9]  Update:  Patient seen and evaluated during hemodialysis treatment today. Tolerating well. Complaining of some nausea today. TEE scheduled for 03/10/2022.  Objective:  Vital signs in last 24 hours:  Temp:  [97.3 F (36.3 C)-98.1 F (36.7 C)] 98.1 F (36.7 C) (07/04 0933) Pulse Rate:  [88-94] 90 (07/04 0933) Resp:  [15-60] 31 (07/04 0930) BP: (158-182)/(88-104) 172/97 (07/04 0933) SpO2:  [98 %-100 %] 99 % (07/04 0933) Weight:  [84.4 kg-100.4 kg] 84.4 kg (07/04 0932)  Weight change:  Filed Weights   03/06/22 1930 03/09/22 0434 03/09/22 0932  Weight: 97.9 kg 100.4 kg 84.4 kg    Intake/Output: I/O last 3 completed shifts: In: 900 [P.O.:900] Out: 150 [Emesis/NG output:150]   Intake/Output this shift:  No intake/output data recorded.  Physical Exam: General: NAD  Head: Normocephalic, atraumatic. Moist oral mucosal membranes  Eyes: Anicteric  Lungs:  Clear bilateral, normal effort  Heart: Regular rate and rhythm  Abdomen:  Soft, nontender, obese  Extremities: No peripheral edema.  Neurologic: Nonfocal, moving all four extremities  Skin: No lesions  Access: Left aVF    Basic Metabolic Panel: Recent Labs  Lab 03/04/22 1412 03/05/22 0613 03/07/22 0804  03/08/22 0503 03/09/22 0617  NA 135 136 137 135 133*  K 5.5* 4.5 4.3 4.5 4.4  CL 98 97* 98 96* 96*  CO2 20* $Remov'26 27 25 22  'oFRSta$ GLUCOSE 100* 109* 83 89 118*  BUN 39* 29* 38* 56* 68*  CREATININE 8.30* 7.08* 7.36* 9.05* 10.83*  CALCIUM 8.3* 8.5* 8.7* 8.9 8.9     Liver Function Tests: Recent Labs  Lab 03/04/22 1412  AST 23  ALT 20  ALKPHOS 85  BILITOT 1.1  PROT 8.6*  ALBUMIN 3.8    No results for input(s): "LIPASE", "AMYLASE" in the last 168 hours. No results for input(s): "AMMONIA" in the last 168 hours.  CBC: Recent Labs  Lab 03/04/22 1412 03/05/22 0613 03/07/22 0804 03/08/22 0503 03/09/22 0617  WBC 5.6 8.6 5.3 5.4 5.3  NEUTROABS 5.1  --   --   --   --   HGB 12.3* 10.6* 11.0* 11.3* 11.6*  HCT 40.2 34.1* 36.3* 36.4* 37.0*  MCV 87.8 86.5 85.8 85.4 84.5  PLT 181 169 187 219 209     Cardiac Enzymes: No results for input(s): "CKTOTAL", "CKMB", "CKMBINDEX", "TROPONINI" in the last 168 hours.  BNP: Invalid input(s): "POCBNP"  CBG: No results for input(s): "GLUCAP" in the last 168 hours.  Microbiology: Results for orders placed or performed during the hospital encounter of 03/04/22  Blood Culture (routine x 2)     Status: Abnormal   Collection Time: 03/04/22  1:52 PM   Specimen: BLOOD  Result Value Ref Range Status   Specimen Description   Final    BLOOD RAC Performed at [facility], [address], [place], [place] [address]    Special Requests   Final    BOTTLES DRAWN AEROBIC AND  ANAEROBIC BCAV Performed at [facility], [address] [place], [place] [address]    Culture  Setup Time   Final    Organism ID to follow GRAM POSITIVE COCCI IN BOTH AEROBIC AND ANAEROBIC BOTTLES CRITICAL RESULT CALLED TO, READ BACK BY AND VERIFIED WITH: JASON BELEUE $[email or web address]'[email or web address]$  ON 03/05/22 SKL GRAM STAIN REVIEWED-AGREE WITH RESULT    Culture (A)  Final    GROUP B STREP(S.AGALACTIAE)ISOLATED STAPHYLOCOCCUS AUREUS CRITICAL RESULT CALLED TO, READ BACK BY AND  VERIFIED WITH: B. BIERS PHARMD, AT [id number] 03/07/22 D. VANHOOK Performed at [facility], [place] [address], [place], [place] [address]    Report Status 03/08/2022 FINAL  Final   Organism ID, Bacteria GROUP B STREP(S.AGALACTIAE)ISOLATED  Final   Organism ID, Bacteria STAPHYLOCOCCUS AUREUS  Final      Susceptibility   Group b strep(s.agalactiae)isolated - MIC*    CLINDAMYCIN <=0.25 SENSITIVE Sensitive     AMPICILLIN <=0.25 SENSITIVE Sensitive     ERYTHROMYCIN <=0.12 SENSITIVE Sensitive     VANCOMYCIN 0.5 SENSITIVE Sensitive     CEFTRIAXONE <=0.12 SENSITIVE Sensitive     LEVOFLOXACIN 1 SENSITIVE Sensitive     PENICILLIN Value in next row Sensitive      SENSITIVE<=0.06    * GROUP B STREP(S.AGALACTIAE)ISOLATED   Staphylococcus aureus - MIC*    CIPROFLOXACIN Value in next row Sensitive      SENSITIVE<=0.06    ERYTHROMYCIN Value in next row Sensitive      SENSITIVE<=0.06    GENTAMICIN Value in next row Sensitive      SENSITIVE<=0.06    OXACILLIN Value in next row Sensitive      SENSITIVE<=0.06    TETRACYCLINE Value in next row Sensitive      SENSITIVE<=0.06    VANCOMYCIN Value in next row Sensitive      SENSITIVE<=0.06    TRIMETH/SULFA Value in next row Sensitive      SENSITIVE<=0.06    CLINDAMYCIN Value in next row Sensitive      SENSITIVE<=0.06    RIFAMPIN Value in next row Sensitive      SENSITIVE<=0.06    Inducible Clindamycin Value in next row Sensitive      SENSITIVE<=0.06    * STAPHYLOCOCCUS AUREUS  Blood Culture ID Panel (Reflexed)     Status: Abnormal   Collection Time: 03/04/22  1:52 PM  Result Value Ref Range Status   Enterococcus faecalis NOT DETECTED NOT DETECTED Final   Enterococcus Faecium NOT DETECTED NOT DETECTED Final   Listeria monocytogenes NOT DETECTED NOT DETECTED Final   Staphylococcus species NOT DETECTED NOT DETECTED Final   Staphylococcus aureus (BCID) NOT DETECTED NOT DETECTED Final   Staphylococcus epidermidis NOT DETECTED NOT DETECTED Final    Staphylococcus lugdunensis NOT DETECTED NOT DETECTED Final   Streptococcus species DETECTED (A) NOT DETECTED Final    Comment: CRITICAL RESULT CALLED TO, READ BACK BY AND VERIFIED WITH: JASON BELEUE $RemoveBefo'[email or web address]$  ON 03/05/22 SKL    Streptococcus agalactiae DETECTED (A) NOT DETECTED Final    Comment: CRITICAL RESULT CALLED TO, READ BACK BY AND VERIFIED WITH: JASON BELEUE $[email or web address]$  ON 03/05/22 SKL    Streptococcus pneumoniae NOT DETECTED NOT DETECTED Final   Streptococcus pyogenes NOT DETECTED NOT DETECTED Final   A.calcoaceticus-baumannii NOT DETECTED NOT DETECTED Final   Bacteroides fragilis NOT DETECTED NOT DETECTED Final   Enterobacterales NOT DETECTED NOT DETECTED Final   Enterobacter cloacae complex NOT DETECTED NOT DETECTED Final   Escherichia coli NOT DETECTED NOT DETECTED Final   Klebsiella aerogenes NOT  DETECTED NOT DETECTED Final   Klebsiella oxytoca NOT DETECTED NOT DETECTED Final   Klebsiella pneumoniae NOT DETECTED NOT DETECTED Final   Proteus species NOT DETECTED NOT DETECTED Final   Salmonella species NOT DETECTED NOT DETECTED Final   Serratia marcescens NOT DETECTED NOT DETECTED Final   Haemophilus influenzae NOT DETECTED NOT DETECTED Final   Neisseria meningitidis NOT DETECTED NOT DETECTED Final   Pseudomonas aeruginosa NOT DETECTED NOT DETECTED Final   Stenotrophomonas maltophilia NOT DETECTED NOT DETECTED Final   Candida albicans NOT DETECTED NOT DETECTED Final   Candida auris NOT DETECTED NOT DETECTED Final   Candida glabrata NOT DETECTED NOT DETECTED Final   Candida krusei NOT DETECTED NOT DETECTED Final   Candida parapsilosis NOT DETECTED NOT DETECTED Final   Candida tropicalis NOT DETECTED NOT DETECTED Final   Cryptococcus neoformans/gattii NOT DETECTED NOT DETECTED Final    Comment: Performed at [facility], [address] [place], [place] [address]  Blood Culture (routine x 2)     Status: Abnormal   Collection Time: 03/04/22  1:57 PM   Specimen: BLOOD   Result Value Ref Range Status   Specimen Description BLOOD BLOOD RIGHT FOREARM  Final   Special Requests   Final    BOTTLES DRAWN AEROBIC AND ANAEROBIC Blood Culture results may not be optimal due to an inadequate volume of blood received in culture bottles   Culture  Setup Time   Final    GRAM POSITIVE COCCI IN BOTH AEROBIC AND ANAEROBIC BOTTLES CRITICAL VALUE NOTED.  VALUE IS CONSISTENT WITH PREVIOUSLY REPORTED AND CALLED VALUE.    Culture (A)  Final    GROUP B STREP(S.AGALACTIAE)ISOLATED STAPHYLOCOCCUS AUREUS CRITICAL RESULT CALLED TO, READ BACK BY AND VERIFIED WITH: B. BIERS PHARMD, AT 3244 03/07/22 BY D. VANHOOK SUSCEPTIBILITIES PERFORMED ON PREVIOUS CULTURE WITHIN THE LAST 5 DAYS. Performed at [facility], [place] [address] [place], [place] [address]    Report Status 03/08/2022 FINAL  Final  Resp Panel by RT-PCR (Flu A&B, Covid) Anterior Nasal Swab     Status: None   Collection Time: 03/04/22  2:12 PM   Specimen: Anterior Nasal Swab  Result Value Ref Range Status   SARS Coronavirus 2 by RT PCR NEGATIVE NEGATIVE Final    Comment: (NOTE) SARS-CoV-2 target nucleic acids are NOT DETECTED.  The SARS-CoV-2 RNA is generally detectable in upper respiratory specimens during the acute phase of infection. The lowest concentration of SARS-CoV-2 viral copies this assay can detect is 138 copies/mL. A negative result does not preclude SARS-Cov-2 infection and should not be used as the sole basis for treatment or other patient management decisions. A negative result may occur with  improper specimen collection/handling, submission of specimen other than nasopharyngeal swab, presence of viral mutation(s) within the areas targeted by this assay, and inadequate number of viral copies(<138 copies/mL). A negative result must be combined with clinical observations, patient history, and epidemiological information. The expected result is Negative.  Fact Sheet for Patients:   EntrepreneurPulse.com.au  Fact Sheet for Healthcare Providers:  IncredibleEmployment.be  This test is no t yet approved or cleared by the [place] FDA and  has been authorized for detection and/or diagnosis of SARS-CoV-2 by FDA under an Emergency Use Authorization (EUA). This EUA will remain  in effect (meaning this test can be used) for the duration of the COVID-19 declaration under Section 564(b)(1) of the Act, 21 U.S.C.section 360bbb-3(b)(1), unless the authorization is terminated  or revoked sooner.  Influenza A by PCR NEGATIVE NEGATIVE Final   Influenza B by PCR NEGATIVE NEGATIVE Final    Comment: (NOTE) The Xpert Xpress SARS-CoV-2/FLU/RSV plus assay is intended as an aid in the diagnosis of influenza from Nasopharyngeal swab specimens and should not be used as a sole basis for treatment. Nasal washings and aspirates are unacceptable for Xpert Xpress SARS-CoV-2/FLU/RSV testing.  Fact Sheet for Patients: EntrepreneurPulse.com.au  Fact Sheet for Healthcare Providers: IncredibleEmployment.be  This test is not yet approved or cleared by the [place] FDA and has been authorized for detection and/or diagnosis of SARS-CoV-2 by FDA under an Emergency Use Authorization (EUA). This EUA will remain in effect (meaning this test can be used) for the duration of the COVID-19 declaration under Section 564(b)(1) of the Act, 21 U.S.C. section 360bbb-3(b)(1), unless the authorization is terminated or revoked.  Performed at [facility], [place], [place] [address]   C Difficile Quick Screen w PCR reflex     Status: None   Collection Time: 03/04/22  9:21 PM   Specimen: Stool  Result Value Ref Range Status   C Diff antigen NEGATIVE NEGATIVE Final   C Diff toxin NEGATIVE NEGATIVE Final   C Diff interpretation No C. difficile detected.  Final    Comment: Performed at [facility], [address] [place], [place] [address]  Gastrointestinal Panel by PCR , Stool     Status: None   Collection Time: 03/04/22  9:21 PM   Specimen: Stool  Result Value Ref Range Status   Campylobacter species NOT DETECTED NOT DETECTED Final   Plesimonas shigelloides NOT DETECTED NOT DETECTED Final   Salmonella species NOT DETECTED NOT DETECTED Final   Yersinia enterocolitica NOT DETECTED NOT DETECTED Final   Vibrio species NOT DETECTED NOT DETECTED Final   Vibrio cholerae NOT DETECTED NOT DETECTED Final   Enteroaggregative E coli (EAEC) NOT DETECTED NOT DETECTED Final   Enteropathogenic E coli (EPEC) NOT DETECTED NOT DETECTED Final   Enterotoxigenic E coli (ETEC) NOT DETECTED NOT DETECTED Final   Shiga like toxin producing E coli (STEC) NOT DETECTED NOT DETECTED Final   Shigella/Enteroinvasive E coli (EIEC) NOT DETECTED NOT DETECTED Final   Cryptosporidium NOT DETECTED NOT DETECTED Final   Cyclospora cayetanensis NOT DETECTED NOT DETECTED Final   Entamoeba histolytica NOT DETECTED NOT DETECTED Final   Giardia lamblia NOT DETECTED NOT DETECTED Final   Adenovirus F40/41 NOT DETECTED NOT DETECTED Final   Astrovirus NOT DETECTED NOT DETECTED Final   Norovirus GI/GII NOT DETECTED NOT DETECTED Final   Rotavirus A NOT DETECTED NOT DETECTED Final   Sapovirus (I, II, IV, and V) NOT DETECTED NOT DETECTED Final    Comment: Performed at [facility], [address], [place], [place] [address]  Urine Culture     Status: Abnormal   Collection Time: 03/04/22  9:39 PM   Specimen: In/Out Cath Urine  Result Value Ref Range Status   Specimen Description   Final    IN/OUT CATH URINE Performed at [facility], [address] [place], [place] [address]    Special Requests   Final    NONE Performed at [facility], [address] [place], [place] [address]    Culture MULTIPLE SPECIES PRESENT, SUGGEST RECOLLECTION (A)  Final   Report Status 03/07/2022  FINAL  Final  Culture, blood (Routine X 2) w Reflex to ID Panel     Status: None (Preliminary result)   Collection Time: 03/06/22  4:50 AM   Specimen: BLOOD  Result Value Ref Range Status   Specimen Description BLOOD RIGHT ANTECUBITAL  Final   Special Requests   Final    BOTTLES DRAWN AEROBIC AND ANAEROBIC Blood Culture adequate volume   Culture   Final    NO GROWTH 3 DAYS Performed at [facility], [address] [place], [place] [address]    Report Status PENDING  Incomplete  Culture, blood (Routine X 2) w Reflex to ID Panel     Status: None (Preliminary result)   Collection Time: 03/06/22  4:50 AM   Specimen: BLOOD  Result Value Ref Range Status   Specimen Description BLOOD BLOOD RIGHT WRIST  Final   Special Requests   Final    BOTTLES DRAWN AEROBIC AND ANAEROBIC Blood Culture adequate volume   Culture   Final    NO GROWTH 3 DAYS Performed at [facility], [address], [place], [place] [address]    Report Status PENDING  Incomplete    Coagulation Studies: No results for input(s): "LABPROT", "INR" in the last 72 hours.   Urinalysis: No results for input(s): "COLORURINE", "LABSPEC", "PHURINE", "GLUCOSEU", "HGBUR", "BILIRUBINUR", "KETONESUR", "PROTEINUR", "UROBILINOGEN", "NITRITE", "LEUKOCYTESUR" in the last 72 hours.  Invalid input(s): "APPERANCEUR"     Imaging: MR THORACIC SPINE W WO CONTRAST  Result Date: 03/09/2022 CLINICAL DATA:  Possible osteomyelitis. EXAM: MRI THORACIC WITHOUT AND WITH CONTRAST TECHNIQUE: Multiplanar and multiecho pulse sequences of the thoracic spine were obtained without and with intravenous contrast. CONTRAST:  48mL GADAVIST GADOBUTROL 1 MMOL/ML IV SOLN COMPARISON:  None Available. FINDINGS: Alignment:  Physiologic. Vertebrae: No fracture, evidence of discitis, or bone lesion. Cord:  Normal signal and morphology. No Epidural collection. Paraspinal and other soft tissues: Negative. Disc levels: No spinal canal stenosis.  IMPRESSION: No discitis-osteomyelitis or epidural collection. Electronically Signed   By: Deatra Robinson M.D.   On: 03/09/2022 00:07   MR LUMBAR SPINE WO CONTRAST  Result Date: 03/08/2022 CLINICAL DATA:  Potential osteomyelitis EXAM: MRI LUMBAR SPINE WITHOUT CONTRAST TECHNIQUE: Multiplanar, multisequence MR imaging of the lumbar spine was performed. No intravenous contrast was administered. COMPARISON:  None Available. FINDINGS: Images are markedly degraded by motion. Segmentation: Standard Alignment:  Normal Vertebrae:  No fracture, evidence of discitis, or bone lesion. Conus medullaris and cauda equina: Conus extends to the L1 level. Conus and cauda equina appear normal. Paraspinal and other soft tissues: Negative Disc levels: L1-L2: Normal disc space and facet joints. No spinal canal stenosis. No neural foraminal stenosis. L2-L3: Medium-sized disc bulge with endplate spurring. Worsened severe spinal canal stenosis. No neural foraminal stenosis. L3-L4: Medium-sized disc bulge with superimposed small central protrusion. Worsened moderate spinal canal stenosis. No neural foraminal stenosis. L4-L5: Medium-sized disc bulge. Unchanged mild spinal canal stenosis. No neural foraminal stenosis. L5-S1: Medium-sized disc bulge. No spinal canal stenosis. Unchanged severe bilateral neural foraminal stenosis. Visualized sacrum: Normal. IMPRESSION: 1. Markedly motion degraded examination. 2. Worsened severe spinal canal stenosis at L2-L3. 3. Worsened moderate L3-4 spinal canal stenosis. 4. Unchanged severe bilateral L5-S1 neural foraminal stenosis. Electronically Signed   By: Deatra Robinson M.D.   On: 03/08/2022 23:59     Medications:     ceFAZolin (ANCEF) IV     And   [START ON 03/13/2022]  ceFAZolin (ANCEF) IV     promethazine (PHENERGAN) injection (IM or IVPB) 12.5 mg (03/08/22 1919)    amLODipine  10 mg Oral Daily   aspirin EC  81 mg Oral Daily   atorvastatin  10 mg Oral Daily   calcium acetate  667 mg Oral TID  WC   carvedilol  6.25 mg Oral BID   Chlorhexidine Gluconate Cloth  6 each Topical Q0600   famotidine  20 mg Oral Daily   heparin  5,000 Units Subcutaneous Q8H   isosorbide mononitrate  30 mg Oral Daily   lisinopril  40 mg Oral Daily   acetaminophen, albuterol, camphor-menthol, dextromethorphan-guaiFENesin, hydrALAZINE, ondansetron (ZOFRAN) IV, promethazine (PHENERGAN) injection (IM or IVPB)  Assessment/ Plan:  Mr. Zaydon Kinser is a 61 y.o.  male past medical history including hypertension, diastolic chronic heart failure, depression, hypertension, and end-stage renal disease on hemodialysis.  Patient presents to the emergency department from his dialysis clinic with complaints of shortness of breath and chest discomfort.  Patient has been admitted for Hypoxia [R09.02] Acute respiratory failure with hypoxia (HCC) [J96.01] Hypervolemia, unspecified hypervolemia type [E87.70] Dyspnea, unspecified type [R06.00] Community acquired pneumonia, unspecified laterality [J18.9] Congestive heart failure, unspecified HF chronicity, unspecified heart failure type ([place]) [I50.9]  [facility] TTS [place] Left AVF 107.5kg  End-stage renal disease on HD TTS.    Patient seen and evaluated during hemodialysis treatment today.  Tolerating well thus far.  2. Anemia of chronic kidney disease Lab Results  Component Value Date   HGB 11.6 (L) 03/09/2022  Hemoglobin at target 11.6.  Hold off on Epogen.  [facility] Rast as outpatient.  3. Secondary Hyperparathyroidism:  Lab Results  Component Value Date   PTH 301 (H) 09/24/2020   CALCIUM 8.9 03/09/2022   CAION 1.21 06/27/2019   PHOS 6.2 (H) 08/14/2021    Continue calcium acetate and follow-up serum phosphorus today.  4.  Hypertension with chronic kidney disease.  Home regimen includes amlodipine, carvedilol, hydralazine, hydrochlorothiazide and lisinopril.  Currently receiving amlodipine, carvedilol, and lisinopril.  Blood pressure should come  down with ongoing dialysis treatment today.  5.  Sepsis/bacteremia.  Initially group B strep was growing.  It appears that Staph aureus also growing now.  Infectious disease following.  Vancomycin added to regimen.  TEE scheduled for tomorrow.  No discitis or osteomyelitis noted on lumbar or thoracic spine.   LOS: 5 Izrael Peak 7/4/202310:00 AM

## 2022-03-09 NOTE — Progress Notes (Signed)
Patient presents for hemodialysis treatment without any acute concerns. LUA AVF functions well, unable to achieved prescribed blood flow rate due to high arterial pressures, reduced to safe parameters Patient receives IV antibiotics as prescribed without adverse reaction. Targeted UF reached with 2.6 fluid removal. Patient continues to have excessive post treatment bleeding > 30 mins. Report given to primary nurse, patient transported to assigned room.

## 2022-03-10 ENCOUNTER — Encounter: Payer: Self-pay | Admitting: Anesthesiology

## 2022-03-10 ENCOUNTER — Inpatient Hospital Stay (HOSPITAL_COMMUNITY)
Admit: 2022-03-10 | Discharge: 2022-03-10 | Disposition: A | Discharge status: Home / Self Care | Payer: Medicare Other | Attending: Physician Assistant | Admitting: Physician Assistant

## 2022-03-10 ENCOUNTER — Encounter
Admission: EM | Disposition: A | Discharge status: Home / Self Care | Payer: Self-pay | Source: Ambulatory Visit | Attending: Internal Medicine

## 2022-03-10 DIAGNOSIS — J9601 Acute respiratory failure with hypoxia: Secondary | ICD-10-CM | POA: Diagnosis not present

## 2022-03-10 DIAGNOSIS — I361 Nonrheumatic tricuspid (valve) insufficiency: Secondary | ICD-10-CM

## 2022-03-10 DIAGNOSIS — I340 Nonrheumatic mitral (valve) insufficiency: Secondary | ICD-10-CM

## 2022-03-10 DIAGNOSIS — R7881 Bacteremia: Secondary | ICD-10-CM

## 2022-03-10 HISTORY — PX: TEE WITHOUT CARDIOVERSION: SHX5443

## 2022-03-10 LAB — CBC
HCT: 37 % — ABNORMAL LOW (ref 39.0–52.0)
Hemoglobin: 11.5 g/dL — ABNORMAL LOW (ref 13.0–17.0)
MCH: 26.4 pg (ref 26.0–34.0)
MCHC: 31.1 g/dL (ref 30.0–36.0)
MCV: 84.9 fL (ref 80.0–100.0)
Platelets: 210 10*3/uL (ref 150–400)
RBC: 4.36 MIL/uL (ref 4.22–5.81)
RDW: 17.7 % — ABNORMAL HIGH (ref 11.5–15.5)
WBC: 5 10*3/uL (ref 4.0–10.5)
nRBC: 0 % (ref 0.0–0.2)

## 2022-03-10 LAB — BASIC METABOLIC PANEL
Anion gap: 11 (ref 5–15)
BUN: 49 mg/dL — ABNORMAL HIGH (ref 8–23)
CO2: 26 mmol/L (ref 22–32)
Calcium: 8.9 mg/dL (ref 8.9–10.3)
Chloride: 99 mmol/L (ref 98–111)
Creatinine, Ser: 9.01 mg/dL — ABNORMAL HIGH (ref 0.61–1.24)
GFR, Estimated: 6 mL/min — ABNORMAL LOW (ref >60)
Glucose, Bld: 91 mg/dL (ref 70–99)
Potassium: 4.6 mmol/L (ref 3.5–5.1)
Sodium: 136 mmol/L (ref 135–145)

## 2022-03-10 LAB — HCV RNA QUANT
HCV Quantitative Log: 6.314 log10 IU/mL (ref >1.70)
HCV Quantitative: 2060000 IU/mL (ref >50)

## 2022-03-10 SURGERY — ECHOCARDIOGRAM, TRANSESOPHAGEAL
Anesthesia: Moderate Sedation

## 2022-03-10 MED ORDER — MIDAZOLAM HCL 2 MG/2ML IJ SOLN
INTRAMUSCULAR | Status: AC
  Filled 2022-03-10: qty 2

## 2022-03-10 MED ORDER — FENTANYL CITRATE (PF) 100 MCG/2ML IJ SOLN
INTRAMUSCULAR | Status: AC
  Filled 2022-03-10: qty 2

## 2022-03-10 MED ORDER — BUTAMBEN-TETRACAINE-BENZOCAINE 2-2-14 % EX AERO
INHALATION_SPRAY | CUTANEOUS | Status: AC
  Filled 2022-03-10: qty 5

## 2022-03-10 MED ORDER — CALCIUM ACETATE (PHOS BINDER) 667 MG PO CAPS: 667.0000 mg | ORAL_CAPSULE | Freq: Three times a day (TID) | ORAL | 0 refills | Status: DC

## 2022-03-10 MED ORDER — ISOSORBIDE MONONITRATE ER 30 MG PO TB24: 30.0000 mg | ORAL_TABLET | Freq: Every day | ORAL | 0 refills | Status: DC

## 2022-03-10 MED ORDER — SODIUM CHLORIDE FLUSH 0.9 % IV SOLN
INTRAVENOUS | Status: AC
  Filled 2022-03-10: qty 10

## 2022-03-10 MED ORDER — CEFAZOLIN IN SODIUM CHLORIDE 3-0.9 GM/100ML-% IV SOLN: 3.0000 g | INTRAVENOUS | 0 refills | Status: DC

## 2022-03-10 MED ORDER — CEFAZOLIN SODIUM-DEXTROSE 2-4 GM/100ML-% IV SOLN: 2.0000 g | INTRAVENOUS | 0 refills | Status: DC

## 2022-03-10 MED ORDER — LISINOPRIL 40 MG PO TABS: 40.0000 mg | ORAL_TABLET | Freq: Every day | ORAL | 0 refills | Status: DC

## 2022-03-10 MED ORDER — FENTANYL CITRATE (PF) 100 MCG/2ML IJ SOLN
INTRAMUSCULAR | Status: AC | PRN
  Administered 2022-03-10 (×2): 25 ug via INTRAVENOUS

## 2022-03-10 MED ORDER — ONDANSETRON HCL 4 MG PO TABS: 4.0000 mg | ORAL_TABLET | Freq: Every day | ORAL | 0 refills | Status: DC | PRN

## 2022-03-10 MED ORDER — MIDAZOLAM HCL 2 MG/2ML IJ SOLN
INTRAMUSCULAR | Status: AC | PRN
  Administered 2022-03-10 (×2): 1 mg via INTRAVENOUS

## 2022-03-10 MED ORDER — AMLODIPINE BESYLATE 10 MG PO TABS
10.0000 mg | ORAL_TABLET | Freq: Every day | ORAL | 0 refills | Status: DC
Start: 2022-03-10 — End: 2022-08-08

## 2022-03-10 MED ORDER — CARVEDILOL 6.25 MG PO TABS: 6.2500 mg | ORAL_TABLET | Freq: Two times a day (BID) | ORAL | 0 refills | Status: DC

## 2022-03-10 MED ORDER — LIDOCAINE VISCOUS HCL 2 % MT SOLN
OROMUCOSAL | Status: AC
  Filled 2022-03-10: qty 15

## 2022-03-10 MED ORDER — ACCU-CHEK FASTCLIX LANCETS MISC: 0 refills | Status: DC

## 2022-03-10 MED ORDER — ASPIRIN 81 MG PO TBEC: 81.0000 mg | DELAYED_RELEASE_TABLET | Freq: Every day | ORAL | 0 refills | Status: DC

## 2022-03-10 MED ORDER — FAMOTIDINE 20 MG PO TABS: 20.0000 mg | ORAL_TABLET | Freq: Every day | ORAL | 0 refills | Status: DC

## 2022-03-10 MED ORDER — LIDOCAINE VISCOUS HCL 2 % MT SOLN
OROMUCOSAL | Status: AC | PRN
  Administered 2022-03-10: 15 mL via OROMUCOSAL

## 2022-03-10 MED ORDER — ALBUTEROL SULFATE HFA 108 (90 BASE) MCG/ACT IN AERS
1.0000 | INHALATION_SPRAY | RESPIRATORY_TRACT | 0 refills | Status: DC | PRN
Start: 2022-03-10 — End: 2022-08-08

## 2022-03-10 MED ORDER — ATORVASTATIN CALCIUM 10 MG PO TABS: 10.0000 mg | ORAL_TABLET | Freq: Every day | ORAL | 0 refills | Status: DC

## 2022-03-10 NOTE — Procedures (Signed)
Transesophageal Echocardiogram :  Indication: bacteremia, evaluate for endocarditis  Procedure: 70ml of viscous lidocaine were given orally to provide local anesthesia to the oropharynx. The patient was positioned supine on the left side, bite block provided. The patient was moderately sedated with the doses of versed and fentanyl as detailed below.  Using digital technique an omniplane probe was advanced into the esophagus without incident.   Moderate sedation: 1. Sedation used:  Versed: $Remove'2mg'QiEeHsZ$ , Fentanyl: 58mcg 2. Time administered:   740  Time when patient started recovery:7:55 3. I was face to face during this time 15 mins  See report in EPIC  for complete details: In brief, imaging revealed normal LV function with no RWMAs and no mural apical thrombus.  .  Estimated ejection fraction was 50%.  Right sided cardiac chambers were normal with no evidence of pulmonary hypertension.  There was no evidence for vegetation or endocarditis  Imaging of the septum showed a small pfo Bubble study was positive for a small shunt 2D and color flow confirmed small PFO  The LA was well visualized in orthogonal views.  There was no spontaneous contrast and no thrombus in the LA and LA appendage   The descending thoracic aorta had no  mural aortic debris with no evidence of aneurysmal dilation or disection   Steve Vance 03/10/2022 7:59 AM

## 2022-03-10 NOTE — Progress Notes (Signed)
Discharge instructions (including medications and follow up appointments) discussed with and copy provided to patient/caregiver

## 2022-03-10 NOTE — TOC Transition Note (Signed)
Transition of Care [facility]) - CM/SW Discharge Note   Patient Details  Name: Steve Vance MRN: [id number] Date of Birth: October 05, 1960  [facility]) CM/SW Contact:  Alberteen Sam, LCSW Phone Number: 03/10/2022, 12:01 PM   Clinical Narrative:     Clinical Narrative:    Patient to dc home today.   Patient lives with his brother and niece. PCP is Elyse Jarvis, MD. Patient drives himself to appointments and HD.   Pharmacy is Paediatric nurse on [address]. No issues obtaining medications. He uses a single-point cane to get around at home. Reports having a shower chair. No further concerns. His fiance' [place] or sister will transport him home at discharge.         Patient will be on 4 weeks iv antibiotics, will receive this while at HD so no home iv infusion needed. HD coordinator is working with pharmacy on setting up antibiotics at HD.   No further TOC discharge needs identified.    Final next level of care: Home/Self Care Barriers to Discharge: No Barriers Identified   Patient Goals and CMS Choice Patient states their goals for this hospitalization and ongoing recovery are:: to go home CMS Medicare.gov Compare Post Acute Care list provided to:: Patient Choice offered to / list presented to : Patient  Discharge Placement                    Patient and family notified of of transfer: 03/10/22  Discharge Plan and Services     Post Acute Care Choice: NA                               Social Determinants of Health (SDOH) Interventions     Readmission Risk Interventions    03/05/2022    8:56 AM 09/25/2020    2:19 PM 09/12/2020    2:14 PM  Readmission Risk Prevention Plan  Transportation Screening Complete Complete Complete  PCP or Specialist Appt within 3-5 Days Complete Complete Complete  HRI or Home Care Consult  Complete Complete  Social Work Consult for [place] Planning/Counseling Complete Complete Complete  Palliative Care Screening Not  Applicable Not Applicable Not Applicable  Medication Review Press photographer) Complete Complete Complete

## 2022-03-10 NOTE — Progress Notes (Addendum)
[facility]  ROUNDING NOTE   Subjective:   Steve Vance is a 61 male with past medical history including hypertension, diastolic chronic heart failure, depression, hypertension, and end-stage renal disease on hemodialysis.  Patient presents to the emergency department from his dialysis clinic with complaints of shortness of breath and chest discomfort.  Patient has been admitted for Hypoxia [R09.02] Acute respiratory failure with hypoxia (HCC) [J96.01] Hypervolemia, unspecified hypervolemia type [E87.70] Dyspnea, unspecified type [R06.00] Community acquired pneumonia, unspecified laterality [J18.9] Congestive heart failure, unspecified HF chronicity, unspecified heart failure type ([place]) [I50.9]  Update:  Patient seen sitting at side of bed Girlfriend at bedside States he feels well today, ready for discharge  Objective:  Vital signs in last 24 hours:  Temp:  [97.6 F (36.4 C)-98.6 F (37 C)] 98.5 F (36.9 C) (07/05 0854) Pulse Rate:  [82-99] 91 (07/05 0854) Resp:  [11-49] 18 (07/05 0854) BP: (134-178)/(59-99) 157/92 (07/05 0854) SpO2:  [94 %-100 %] 96 % (07/05 0854) Weight:  [62.3 kg-97.6 kg] 62.3 kg (07/05 0500)  Weight change: 0.019 kg Filed Weights   03/09/22 0932 03/09/22 1514 03/10/22 0500  Weight: 100.4 kg 97.6 kg 62.3 kg    Intake/Output: I/O last 3 completed shifts: In: 870 [P.O.:720; IV Piggyback:150] Out: 2606 [Other:2606]   Intake/Output this shift:  Total I/O In: 240 [P.O.:240] Out: -   Physical Exam: General: NAD  Head: Normocephalic, atraumatic. Moist oral mucosal membranes  Eyes: Anicteric  Lungs:  Clear bilateral, normal effort  Heart: Regular rate and rhythm  Abdomen:  Soft, nontender, obese  Extremities: No peripheral edema.  Neurologic: Nonfocal, moving all four extremities  Skin: No lesions  Access: Left aVF    Basic Metabolic Panel: Recent Labs  Lab 03/05/22 0613 03/07/22 0804 03/08/22 0503 03/09/22 0617  03/10/22 0628  NA 136 137 135 133* 136  K 4.5 4.3 4.5 4.4 4.6  CL 97* 98 96* 96* 99  CO2 $Re'26 27 25 22 26  'hpj$ GLUCOSE 109* 83 89 118* 91  BUN 29* 38* 56* 68* 49*  CREATININE 7.08* 7.36* 9.05* 10.83* 9.01*  CALCIUM 8.5* 8.7* 8.9 8.9 8.9     Liver Function Tests: Recent Labs  Lab 03/04/22 1412  AST 23  ALT 20  ALKPHOS 85  BILITOT 1.1  PROT 8.6*  ALBUMIN 3.8    No results for input(s): "LIPASE", "AMYLASE" in the last 168 hours. No results for input(s): "AMMONIA" in the last 168 hours.  CBC: Recent Labs  Lab 03/04/22 1412 03/05/22 1497 03/07/22 0804 03/08/22 0503 03/09/22 0617 03/10/22 0628  WBC 5.6 8.6 5.3 5.4 5.3 5.0  NEUTROABS 5.1  --   --   --   --   --   HGB 12.3* 10.6* 11.0* 11.3* 11.6* 11.5*  HCT 40.2 34.1* 36.3* 36.4* 37.0* 37.0*  MCV 87.8 86.5 85.8 85.4 84.5 84.9  PLT 181 169 187 219 209 210     Cardiac Enzymes: No results for input(s): "CKTOTAL", "CKMB", "CKMBINDEX", "TROPONINI" in the last 168 hours.  BNP: Invalid input(s): "POCBNP"  CBG: No results for input(s): "GLUCAP" in the last 168 hours.  Microbiology: Results for orders placed or performed during the hospital encounter of 03/04/22  Blood Culture (routine x 2)     Status: Abnormal   Collection Time: 03/04/22  1:52 PM   Specimen: BLOOD  Result Value Ref Range Status   Specimen Description   Final    BLOOD RAC Performed at [facility], [address], [place], [place] [address]  Special Requests   Final    BOTTLES DRAWN AEROBIC AND ANAEROBIC BCAV Performed at [facility], [address] [place], [place] [address]    Culture  Setup Time   Final    Organism ID to follow GRAM POSITIVE COCCI IN BOTH AEROBIC AND ANAEROBIC BOTTLES CRITICAL RESULT CALLED TO, READ BACK BY AND VERIFIED WITH: JASON BELEUE $RemoveBefo'[email or web address]'[email or web address]$  ON 03/05/22 SKL GRAM STAIN REVIEWED-AGREE WITH RESULT    Culture (A)  Final    GROUP B STREP(S.AGALACTIAE)ISOLATED STAPHYLOCOCCUS AUREUS CRITICAL RESULT CALLED  TO, READ BACK BY AND VERIFIED WITH: B. BIERS PHARMD, AT 6415 03/07/22 D. VANHOOK Performed at [facility], [place] [address] [place], [place] [address]    Report Status 03/08/2022 FINAL  Final   Organism ID, Bacteria GROUP B STREP(S.AGALACTIAE)ISOLATED  Final   Organism ID, Bacteria STAPHYLOCOCCUS AUREUS  Final      Susceptibility   Group b strep(s.agalactiae)isolated - MIC*    CLINDAMYCIN <=0.25 SENSITIVE Sensitive     AMPICILLIN <=0.25 SENSITIVE Sensitive     ERYTHROMYCIN <=0.12 SENSITIVE Sensitive     VANCOMYCIN 0.5 SENSITIVE Sensitive     CEFTRIAXONE <=0.12 SENSITIVE Sensitive     LEVOFLOXACIN 1 SENSITIVE Sensitive     PENICILLIN Value in next row Sensitive      SENSITIVE<=0.06    * GROUP B STREP(S.AGALACTIAE)ISOLATED   Staphylococcus aureus - MIC*    CIPROFLOXACIN Value in next row Sensitive      SENSITIVE<=0.06    ERYTHROMYCIN Value in next row Sensitive      SENSITIVE<=0.06    GENTAMICIN Value in next row Sensitive      SENSITIVE<=0.06    OXACILLIN Value in next row Sensitive      SENSITIVE<=0.06    TETRACYCLINE Value in next row Sensitive      SENSITIVE<=0.06    VANCOMYCIN Value in next row Sensitive      SENSITIVE<=0.06    TRIMETH/SULFA Value in next row Sensitive      SENSITIVE<=0.06    CLINDAMYCIN Value in next row Sensitive      SENSITIVE<=0.06    RIFAMPIN Value in next row Sensitive      SENSITIVE<=0.06    Inducible Clindamycin Value in next row Sensitive      SENSITIVE<=0.06    * STAPHYLOCOCCUS AUREUS  Blood Culture ID Panel (Reflexed)     Status: Abnormal   Collection Time: 03/04/22  1:52 PM  Result Value Ref Range Status   Enterococcus faecalis NOT DETECTED NOT DETECTED Final   Enterococcus Faecium NOT DETECTED NOT DETECTED Final   Listeria monocytogenes NOT DETECTED NOT DETECTED Final   Staphylococcus species NOT DETECTED NOT DETECTED Final   Staphylococcus aureus (BCID) NOT DETECTED NOT DETECTED Final   Staphylococcus epidermidis NOT DETECTED NOT  DETECTED Final   Staphylococcus lugdunensis NOT DETECTED NOT DETECTED Final   Streptococcus species DETECTED (A) NOT DETECTED Final    Comment: CRITICAL RESULT CALLED TO, READ BACK BY AND VERIFIED WITH: JASON BELEUE $RemoveBefo'@0217'[email or web address]$  ON 03/05/22 SKL    Streptococcus agalactiae DETECTED (A) NOT DETECTED Final    Comment: CRITICAL RESULT CALLED TO, READ BACK BY AND VERIFIED WITH: JASON BELEUE $RemoveBefo'[email or web address]'[email or web address]$  ON 03/05/22 SKL    Streptococcus pneumoniae NOT DETECTED NOT DETECTED Final   Streptococcus pyogenes NOT DETECTED NOT DETECTED Final   A.calcoaceticus-baumannii NOT DETECTED NOT DETECTED Final   Bacteroides fragilis NOT DETECTED NOT DETECTED Final   Enterobacterales NOT DETECTED NOT DETECTED Final   Enterobacter cloacae complex NOT DETECTED NOT DETECTED Final  Escherichia coli NOT DETECTED NOT DETECTED Final   Klebsiella aerogenes NOT DETECTED NOT DETECTED Final   Klebsiella oxytoca NOT DETECTED NOT DETECTED Final   Klebsiella pneumoniae NOT DETECTED NOT DETECTED Final   Proteus species NOT DETECTED NOT DETECTED Final   Salmonella species NOT DETECTED NOT DETECTED Final   Serratia marcescens NOT DETECTED NOT DETECTED Final   Haemophilus influenzae NOT DETECTED NOT DETECTED Final   Neisseria meningitidis NOT DETECTED NOT DETECTED Final   Pseudomonas aeruginosa NOT DETECTED NOT DETECTED Final   Stenotrophomonas maltophilia NOT DETECTED NOT DETECTED Final   Candida albicans NOT DETECTED NOT DETECTED Final   Candida auris NOT DETECTED NOT DETECTED Final   Candida glabrata NOT DETECTED NOT DETECTED Final   Candida krusei NOT DETECTED NOT DETECTED Final   Candida parapsilosis NOT DETECTED NOT DETECTED Final   Candida tropicalis NOT DETECTED NOT DETECTED Final   Cryptococcus neoformans/gattii NOT DETECTED NOT DETECTED Final    Comment: Performed at [facility], [address] [place], [place] [address]  Blood Culture (routine x 2)     Status: Abnormal   Collection Time: 03/04/22  1:57 PM    Specimen: BLOOD  Result Value Ref Range Status   Specimen Description BLOOD BLOOD RIGHT FOREARM  Final   Special Requests   Final    BOTTLES DRAWN AEROBIC AND ANAEROBIC Blood Culture results may not be optimal due to an inadequate volume of blood received in culture bottles   Culture  Setup Time   Final    GRAM POSITIVE COCCI IN BOTH AEROBIC AND ANAEROBIC BOTTLES CRITICAL VALUE NOTED.  VALUE IS CONSISTENT WITH PREVIOUSLY REPORTED AND CALLED VALUE.    Culture (A)  Final    GROUP B STREP(S.AGALACTIAE)ISOLATED STAPHYLOCOCCUS AUREUS CRITICAL RESULT CALLED TO, READ BACK BY AND VERIFIED WITH: B. BIERS PHARMD, AT 8295 03/07/22 BY D. VANHOOK SUSCEPTIBILITIES PERFORMED ON PREVIOUS CULTURE WITHIN THE LAST 5 DAYS. Performed at [facility], [place] [address] [place], [place] [address]    Report Status 03/08/2022 FINAL  Final  Resp Panel by RT-PCR (Flu A&B, Covid) Anterior Nasal Swab     Status: None   Collection Time: 03/04/22  2:12 PM   Specimen: Anterior Nasal Swab  Result Value Ref Range Status   SARS Coronavirus 2 by RT PCR NEGATIVE NEGATIVE Final    Comment: (NOTE) SARS-CoV-2 target nucleic acids are NOT DETECTED.  The SARS-CoV-2 RNA is generally detectable in upper respiratory specimens during the acute phase of infection. The lowest concentration of SARS-CoV-2 viral copies this assay can detect is 138 copies/mL. A negative result does not preclude SARS-Cov-2 infection and should not be used as the sole basis for treatment or other patient management decisions. A negative result may occur with  improper specimen collection/handling, submission of specimen other than nasopharyngeal swab, presence of viral mutation(s) within the areas targeted by this assay, and inadequate number of viral copies(<138 copies/mL). A negative result must be combined with clinical observations, patient history, and epidemiological information. The expected result is Negative.  Fact Sheet for Patients:   EntrepreneurPulse.com.au  Fact Sheet for Healthcare Providers:  IncredibleEmployment.be  This test is no t yet approved or cleared by the [place] FDA and  has been authorized for detection and/or diagnosis of SARS-CoV-2 by FDA under an Emergency Use Authorization (EUA). This EUA will remain  in effect (meaning this test can be used) for the duration of the COVID-19 declaration under Section 564(b)(1) of the Act, 21 U.S.C.section 360bbb-3(b)(1), unless the  authorization is terminated  or revoked sooner.       Influenza A by PCR NEGATIVE NEGATIVE Final   Influenza B by PCR NEGATIVE NEGATIVE Final    Comment: (NOTE) The Xpert Xpress SARS-CoV-2/FLU/RSV plus assay is intended as an aid in the diagnosis of influenza from Nasopharyngeal swab specimens and should not be used as a sole basis for treatment. Nasal washings and aspirates are unacceptable for Xpert Xpress SARS-CoV-2/FLU/RSV testing.  Fact Sheet for Patients: BloggerCourse.com  Fact Sheet for Healthcare Providers: SeriousBroker.it  This test is not yet approved or cleared by the [place] FDA and has been authorized for detection and/or diagnosis of SARS-CoV-2 by FDA under an Emergency Use Authorization (EUA). This EUA will remain in effect (meaning this test can be used) for the duration of the COVID-19 declaration under Section 564(b)(1) of the Act, 21 U.S.C. section 360bbb-3(b)(1), unless the authorization is terminated or revoked.  Performed at [facility], [address] [place], [place] [address]   C Difficile Quick Screen w PCR reflex     Status: None   Collection Time: 03/04/22  9:21 PM   Specimen: Stool  Result Value Ref Range Status   C Diff antigen NEGATIVE NEGATIVE Final   C Diff toxin NEGATIVE NEGATIVE Final   C Diff interpretation No C. difficile detected.  Final    Comment: Performed at [facility], [address] [place], [place] [address]  Gastrointestinal Panel by PCR , Stool     Status: None   Collection Time: 03/04/22  9:21 PM   Specimen: Stool  Result Value Ref Range Status   Campylobacter species NOT DETECTED NOT DETECTED Final   Plesimonas shigelloides NOT DETECTED NOT DETECTED Final   Salmonella species NOT DETECTED NOT DETECTED Final   Yersinia enterocolitica NOT DETECTED NOT DETECTED Final   Vibrio species NOT DETECTED NOT DETECTED Final   Vibrio cholerae NOT DETECTED NOT DETECTED Final   Enteroaggregative E coli (EAEC) NOT DETECTED NOT DETECTED Final   Enteropathogenic E coli (EPEC) NOT DETECTED NOT DETECTED Final   Enterotoxigenic E coli (ETEC) NOT DETECTED NOT DETECTED Final   Shiga like toxin producing E coli (STEC) NOT DETECTED NOT DETECTED Final   Shigella/Enteroinvasive E coli (EIEC) NOT DETECTED NOT DETECTED Final   Cryptosporidium NOT DETECTED NOT DETECTED Final   Cyclospora cayetanensis NOT DETECTED NOT DETECTED Final   Entamoeba histolytica NOT DETECTED NOT DETECTED Final   Giardia lamblia NOT DETECTED NOT DETECTED Final   Adenovirus F40/41 NOT DETECTED NOT DETECTED Final   Astrovirus NOT DETECTED NOT DETECTED Final   Norovirus GI/GII NOT DETECTED NOT DETECTED Final   Rotavirus A NOT DETECTED NOT DETECTED Final   Sapovirus (I, II, IV, and V) NOT DETECTED NOT DETECTED Final    Comment: Performed at [facility], [address], [place], [place] [address]  Urine Culture     Status: Abnormal   Collection Time: 03/04/22  9:39 PM   Specimen: In/Out Cath Urine  Result Value Ref Range Status   Specimen Description   Final    IN/OUT CATH URINE Performed at [facility], [address], [place], [place] [address]    Special Requests   Final    NONE Performed at [facility], [address] [place], [place] [address]    Culture MULTIPLE SPECIES PRESENT, SUGGEST RECOLLECTION (A)  Final   Report Status 03/07/2022  FINAL  Final  Culture, blood (Routine X 2) w Reflex to ID Panel  Status: None (Preliminary result)   Collection Time: 03/06/22  4:50 AM   Specimen: BLOOD  Result Value Ref Range Status   Specimen Description BLOOD RIGHT ANTECUBITAL  Final   Special Requests   Final    BOTTLES DRAWN AEROBIC AND ANAEROBIC Blood Culture adequate volume   Culture   Final    NO GROWTH 4 DAYS Performed at [facility], [address], [place], [place] [address]    Report Status PENDING  Incomplete  Culture, blood (Routine X 2) w Reflex to ID Panel     Status: None (Preliminary result)   Collection Time: 03/06/22  4:50 AM   Specimen: BLOOD  Result Value Ref Range Status   Specimen Description BLOOD BLOOD RIGHT WRIST  Final   Special Requests   Final    BOTTLES DRAWN AEROBIC AND ANAEROBIC Blood Culture adequate volume   Culture   Final    NO GROWTH 4 DAYS Performed at [facility], [address], [place], [place] [address]    Report Status PENDING  Incomplete    Coagulation Studies: No results for input(s): "LABPROT", "INR" in the last 72 hours.   Urinalysis: No results for input(s): "COLORURINE", "LABSPEC", "PHURINE", "GLUCOSEU", "HGBUR", "BILIRUBINUR", "KETONESUR", "PROTEINUR", "UROBILINOGEN", "NITRITE", "LEUKOCYTESUR" in the last 72 hours.  Invalid input(s): "APPERANCEUR"     Imaging: MR THORACIC SPINE W WO CONTRAST  Result Date: 03/09/2022 CLINICAL DATA:  Possible osteomyelitis. EXAM: MRI THORACIC WITHOUT AND WITH CONTRAST TECHNIQUE: Multiplanar and multiecho pulse sequences of the thoracic spine were obtained without and with intravenous contrast. CONTRAST:  20mL GADAVIST GADOBUTROL 1 MMOL/ML IV SOLN COMPARISON:  None Available. FINDINGS: Alignment:  Physiologic. Vertebrae: No fracture, evidence of discitis, or bone lesion. Cord:  Normal signal and morphology. No Epidural collection. Paraspinal and other soft tissues: Negative. Disc levels: No spinal canal stenosis.  IMPRESSION: No discitis-osteomyelitis or epidural collection. Electronically Signed   By: Ulyses Jarred M.D.   On: 03/09/2022 00:07   MR LUMBAR SPINE WO CONTRAST  Result Date: 03/08/2022 CLINICAL DATA:  Potential osteomyelitis EXAM: MRI LUMBAR SPINE WITHOUT CONTRAST TECHNIQUE: Multiplanar, multisequence MR imaging of the lumbar spine was performed. No intravenous contrast was administered. COMPARISON:  None Available. FINDINGS: Images are markedly degraded by motion. Segmentation: Standard Alignment:  Normal Vertebrae:  No fracture, evidence of discitis, or bone lesion. Conus medullaris and cauda equina: Conus extends to the L1 level. Conus and cauda equina appear normal. Paraspinal and other soft tissues: Negative Disc levels: L1-L2: Normal disc space and facet joints. No spinal canal stenosis. No neural foraminal stenosis. L2-L3: Medium-sized disc bulge with endplate spurring. Worsened severe spinal canal stenosis. No neural foraminal stenosis. L3-L4: Medium-sized disc bulge with superimposed small central protrusion. Worsened moderate spinal canal stenosis. No neural foraminal stenosis. L4-L5: Medium-sized disc bulge. Unchanged mild spinal canal stenosis. No neural foraminal stenosis. L5-S1: Medium-sized disc bulge. No spinal canal stenosis. Unchanged severe bilateral neural foraminal stenosis. Visualized sacrum: Normal. IMPRESSION: 1. Markedly motion degraded examination. 2. Worsened severe spinal canal stenosis at L2-L3. 3. Worsened moderate L3-4 spinal canal stenosis. 4. Unchanged severe bilateral L5-S1 neural foraminal stenosis. Electronically Signed   By: Ulyses Jarred M.D.   On: 03/08/2022 23:59     Medications:    sodium chloride 20 mL/hr at 03/10/22 0726    ceFAZolin (ANCEF) IV Stopped (03/09/22 1455)   And   [START ON 03/13/2022]  ceFAZolin (ANCEF) IV     promethazine (PHENERGAN) injection (IM or IVPB) Stopped (03/08/22 1938)  amLODipine  10 mg Oral Daily   aspirin EC  81 mg Oral Daily    atorvastatin  10 mg Oral Daily   calcium acetate  667 mg Oral TID WC   carvedilol  6.25 mg Oral BID   Chlorhexidine Gluconate Cloth  6 each Topical Q0600   famotidine  20 mg Oral Daily   fentaNYL       heparin  5,000 Units Subcutaneous Q8H   isosorbide mononitrate  30 mg Oral Daily   lidocaine       lisinopril  40 mg Oral Daily   midazolam       sodium chloride flush       acetaminophen, albuterol, camphor-menthol, dextromethorphan-guaiFENesin, fentaNYL, hydrALAZINE, lidocaine, midazolam, ondansetron (ZOFRAN) IV, promethazine (PHENERGAN) injection (IM or IVPB), sodium chloride flush  Assessment/ Plan:  Mr. Erving Sassano is a 61 y.o.  male past medical history including hypertension, diastolic chronic heart failure, depression, hypertension, and end-stage renal disease on hemodialysis.  Patient presents to the emergency department from his dialysis clinic with complaints of shortness of breath and chest discomfort.  Patient has been admitted for Hypoxia [R09.02] Acute respiratory failure with hypoxia (HCC) [J96.01] Hypervolemia, unspecified hypervolemia type [E87.70] Dyspnea, unspecified type [R06.00] Community acquired pneumonia, unspecified laterality [J18.9] Congestive heart failure, unspecified HF chronicity, unspecified heart failure type ([place]) [I50.9]  [facility] [place] Left AVF 107.5kg  End-stage renal disease on HD TTS.    Received dialysis yesterday, UF 2.6L achieved. Next treatment scheduled for Thursday.   2. Anemia of chronic kidney disease Lab Results  Component Value Date   HGB 11.5 (L) 03/10/2022  Hemoglobin at target 11.5. [place] Rast as outpatient.  3. Secondary Hyperparathyroidism:  Lab Results  Component Value Date   PTH 301 (H) 09/24/2020   CALCIUM 8.9 03/10/2022   CAION 1.21 06/27/2019   PHOS 6.2 (H) 08/14/2021   Calcium within acceptable range.  Continue calcium acetate  4.  Hypertension with chronic kidney disease.  Home regimen  includes amlodipine, carvedilol, hydralazine, hydrochlorothiazide and lisinopril.  Currently receiving amlodipine, carvedilol, and lisinopril.  Blood pressure currently 157/92  5.  Sepsis/bacteremia.  Initially group B strep was growing.  It appears that Staph aureus also growing now.  Infectious disease following.  Vancomycin added to regimen.  TEE completed today, showed no vegetation. Patient will continue Cefazolin with dialysis. Cefazoline 2g/2g/3g until 04/03/22 with weekly CMP and CBC.    LOS: 6   7/5/202311:47 AM

## 2022-03-10 NOTE — Progress Notes (Signed)
*  PRELIMINARY RESULTS* Echocardiogram Echocardiogram Transesophageal has been performed.  Steve Vance 03/10/2022, 8:11 AM

## 2022-03-10 NOTE — Treatment Plan (Signed)
Diagnosis: MSSA and GBS bacteremia ESRD on dilaysis    Allergies  Allergen Reactions   Baclofen Nausea And Vomiting   Gabapentin Nausea And Vomiting   Oxycodone-Acetaminophen Nausea And Vomiting    OPAT Orders Discharge antibiotics: Cefazolin 2 grams - 2 grams and 3 grams on the days of dialysis- Tu, Th and sat respectively Duration: 4 weeks End Date: 04/03/22    Labs weekly while on IV antibiotics: X__ CBC with differential  _X_ CMP   Fax weekly lab results  promptly to [phone]  Clinic Follow Up Appt:with Dr.Monterius Rolf on 03/30/22 at  10.15 am  Call [phone] with any questions

## 2022-03-10 NOTE — Plan of Care (Signed)
  Problem: Education: Goal: Ability to demonstrate management of disease process will improve Outcome: Progressing Goal: Ability to verbalize understanding of medication therapies will improve Outcome: Progressing Goal: Individualized Educational Video(s) Outcome: Progressing   Problem: Activity: Goal: Capacity to carry out activities will improve Outcome: Progressing   Problem: Cardiac: Goal: Ability to achieve and maintain adequate cardiopulmonary perfusion will improve Outcome: Progressing   Problem: Activity: Goal: Ability to tolerate increased activity will improve Outcome: Progressing   Problem: Education: Goal: Knowledge of General Education information will improve Description: Including pain rating scale, medication(s)/side effects and non-pharmacologic comfort measures Outcome: Progressing

## 2022-03-10 NOTE — Discharge Summary (Signed)
Physician Discharge Summary   Patient: Steve Vance MRN: [id number]  DOB: 1961/08/26   Admit:     Date of Admission: 03/04/2022 Admitted from: home   Discharge: Date of discharge: 03/10/22 Disposition: Home health Condition at discharge: good  CODE STATUS: FULL     Discharge Physician: Emeterio Reeve, DO [facility]     PCP: Theotis Burrow, MD  Recommendations for Outpatient Follow-up:  Follow up with PCP Revelo, Elyse Jarvis, MD in 1-2 weeks Please obtain labs/tests: CBC, BMP in 1-2 weeks Please follow up on the following pending results: none Antibiotics at dialysis    Discharge Instructions     Diet - low sodium heart healthy   Complete by: As directed    Increase activity slowly   Complete by: As directed         Hospital Course:  Steve Vance is a 61 y.o. male with medical history significant of ESRD-HD (TTS), HTN, HLD, chronic pain syndrome, cervicalgia, dCHF, depression, who presented to ED 03/04/2022 with shortness of breath 1 hour after dialysis, worsening to severe distress. No O2 at home 06/29 admitted for acute hypoxic respiratory failure likely d/t fluid overload, presumptive tc for CAP.  6/30-03/09/22: Pt was found to have bacteremia w/ strep agalacitae & staph aureus. ID consulted. TTE did not show any vegetations, TEE 07/04 also no vegetation. EF 50%, (+)small PFO/shunt. MRI of T/S did not show any abscesses. Pt is on IV cefazolin as per ID. Repeat blood cxs NGTD  07/05: Net IO Since Admission: -7,886.85 mL [03/10/22 0821]. Blood Cx NGTD x4 days    Consultants:  Nephrology  Infectious disease  Cardiology   Procedures: TEE 03/09/2022 TTE 03/10/2022     Discharge Diagnoses: Principal Problem:   Acute respiratory failure with hypoxia ([place]) Active Problems:   Volume overload   Essential hypertension   ESRD on dialysis (Steve Vance)   Hyperlipidemia   Chronic diastolic CHF (congestive heart failure) ([facility])    Hyperkalemia   Diarrhea    Assessment & Plan:  Acute hypoxic respiratory failure: likely secondary to fluid overload & possible pneumonia.  Resolved    Possible pneumonia: as per CXR. Continued on IV cefazolin, bronchodilators and encourage incentive spirometry    Bacteremia: blood cxs growing strep agalactiae & stap aureus, ID following. Repeat blood cxs NGTD. Echo shows EF 70-01%, diastolic function are indeterminate, LV demonstrates global hypokinesis, no vegetations mentioned. TEE no vegetation.    Volume overload: resolved    HTN: poorly controlled. Continue on coreg, lisinopril, amlodipine & imdur (imdur started this admission). IV hydralazine prn     ESRD: on HD TTS. Nephro recs apprec    ACD: likely secondary to ESRD. H&H are stable    Diarrhea: etiology unclear, possibly abx related. C. diff, GI PCR panel were both neg    Hyperkalemia: resolved    Chronic diastolic CHF: echo on 74/94/4967 showed EF of 55 to 60%. Volume management w/ HD    HLD: continue on statin    Obesity: BMI 33.7. Complicates overall care & prognosis         Discharge Instructions  Allergies as of 03/10/2022       Reactions   Baclofen Nausea And Vomiting   Gabapentin Nausea And Vomiting   Oxycodone-acetaminophen Nausea And Vomiting        Medication List     STOP taking these medications    hydrALAZINE 25 MG tablet Commonly known as: APRESOLINE   hydrochlorothiazide 25 MG tablet Commonly known as:  HYDRODIURIL   ipratropium-albuterol 0.5-2.5 (3) MG/3ML Soln Commonly known as: DUONEB   labetalol 200 MG tablet Commonly known as: NORMODYNE       TAKE these medications    Accu-Chek FastClix Lancets Misc USE TO CHECK BLOOD SUGAR UP TO 4 TIMES DAILY AS DIRECTED   albuterol 108 (90 Base) MCG/ACT inhaler Commonly known as: VENTOLIN HFA Inhale 1-2 puffs into the lungs every 4 (four) hours as needed for wheezing or shortness of breath.   amLODipine 10 MG tablet Commonly  known as: NORVASC Take 1 tablet (10 mg total) by mouth daily.   aspirin EC 81 MG tablet Take 1 tablet (81 mg total) by mouth daily.   atorvastatin 10 MG tablet Commonly known as: LIPITOR Take 1 tablet (10 mg total) by mouth daily.   blood glucose meter kit and supplies Kit Dispense based on patient and insurance preference. Use up to four times daily as directed. (FOR ICD-9 250.00, 250.01).   calcium acetate 667 MG capsule Commonly known as: PHOSLO Take 1 capsule (667 mg total) by mouth 3 (three) times daily with meals. What changed: how much to take   carvedilol 6.25 MG tablet Commonly known as: COREG Take 1 tablet (6.25 mg total) by mouth 2 (two) times daily.   ceFAZolin 2-4 GM/100ML-% IVPB Commonly known as: ANCEF Inject 100 mLs (2 g total) into the vein 2 (two) times a week. Start taking on: March 11, 2022   ceFAZolin 3-0.9 GM/100ML-% IVPB Commonly known as: ANCEF Inject 100 mLs (3 g total) into the vein once a week. Start taking on: March 13, 2022   famotidine 20 MG tablet Commonly known as: PEPCID Take 1 tablet (20 mg total) by mouth daily.   isosorbide mononitrate 30 MG 24 hr tablet Commonly known as: IMDUR Take 1 tablet (30 mg total) by mouth daily. Start taking on: March 11, 2022   lisinopril 40 MG tablet Commonly known as: ZESTRIL Take 1 tablet (40 mg total) by mouth daily.   ondansetron 4 MG tablet Commonly known as: Zofran Take 1 tablet (4 mg total) by mouth daily as needed for nausea or vomiting.         Follow-up Information     Revelo, Elyse Jarvis, MD Follow up.   Specialty: Family Medicine Why: office Steve Vance call with an appointment Contact information: [address] [place] [address] [phone]                 Allergies  Allergen Reactions   Baclofen Nausea And Vomiting   Gabapentin Nausea And Vomiting   Oxycodone-Acetaminophen Nausea And Vomiting     Subjective: Pt feeling great today, wants to go home! No  CP/SOB, he is up walking the halls and socializing w/ other patiens    Discharge Exam: Vitals:   03/10/22 0830 03/10/22 0854  BP: 139/82 (!) 157/92  Pulse: 94 91  Resp: 11 18  Temp:  98.5 F (36.9 C)  SpO2: 94% 96%   Vitals:   03/10/22 0800 03/10/22 0815 03/10/22 0830 03/10/22 0854  BP: 137/88 140/83 139/82 (!) 157/92  Pulse: 93 93 94 91  Resp: '19 12 11 18  ' Temp:    98.5 F (36.9 C)  TempSrc:      SpO2: 98% 98% 94% 96%  Weight:      Height:         General: Pt is alert, awake, not in acute distress Cardiovascular: RRR, S1/S2 +, no rubs, no gallops Respiratory: CTA bilaterally, no  wheezing, no rhonchi Abdominal: Soft, NT, ND, bowel sounds + Extremities: no edema, no cyanosis     The results of significant diagnostics from this hospitalization (including imaging, microbiology, ancillary and laboratory) are listed below for reference.     Microbiology: Recent Results (from the past 240 hour(s))  Blood Culture (routine x 2)     Status: Abnormal   Collection Time: 03/04/22  1:52 PM   Specimen: BLOOD  Result Value Ref Range Status   Specimen Description   Final    BLOOD RAC Performed at [facility], [address], [place], [place] [address]    Special Requests   Final    BOTTLES DRAWN AEROBIC AND ANAEROBIC BCAV Performed at [facility], [address] [place], [place] [address]    Culture  Setup Time   Final    Organism ID to follow GRAM POSITIVE COCCI IN BOTH AEROBIC AND ANAEROBIC BOTTLES CRITICAL RESULT CALLED TO, READ BACK BY AND VERIFIED WITH: JASON BELEUE '@[id number]'  ON 03/05/22 SKL GRAM STAIN REVIEWED-AGREE WITH RESULT    Culture (A)  Final    GROUP B STREP(S.AGALACTIAE)ISOLATED STAPHYLOCOCCUS AUREUS CRITICAL RESULT CALLED TO, READ BACK BY AND VERIFIED WITH: B. BIERS PHARMD, AT 7124 03/07/22 D. VANHOOK Performed at [facility], [place] [address] [place], [place] [address]    Report Status 03/08/2022 FINAL  Final   Organism ID,  Bacteria GROUP B STREP(S.AGALACTIAE)ISOLATED  Final   Organism ID, Bacteria STAPHYLOCOCCUS AUREUS  Final      Susceptibility   Group b strep(s.agalactiae)isolated - MIC*    CLINDAMYCIN <=0.25 SENSITIVE Sensitive     AMPICILLIN <=0.25 SENSITIVE Sensitive     ERYTHROMYCIN <=0.12 SENSITIVE Sensitive     VANCOMYCIN 0.5 SENSITIVE Sensitive     CEFTRIAXONE <=0.12 SENSITIVE Sensitive     LEVOFLOXACIN 1 SENSITIVE Sensitive     PENICILLIN Value in next row Sensitive      SENSITIVE<=0.06    * GROUP B STREP(S.AGALACTIAE)ISOLATED   Staphylococcus aureus - MIC*    CIPROFLOXACIN Value in next row Sensitive      SENSITIVE<=0.06    ERYTHROMYCIN Value in next row Sensitive      SENSITIVE<=0.06    GENTAMICIN Value in next row Sensitive      SENSITIVE<=0.06    OXACILLIN Value in next row Sensitive      SENSITIVE<=0.06    TETRACYCLINE Value in next row Sensitive      SENSITIVE<=0.06    VANCOMYCIN Value in next row Sensitive      SENSITIVE<=0.06    TRIMETH/SULFA Value in next row Sensitive      SENSITIVE<=0.06    CLINDAMYCIN Value in next row Sensitive      SENSITIVE<=0.06    RIFAMPIN Value in next row Sensitive      SENSITIVE<=0.06    Inducible Clindamycin Value in next row Sensitive      SENSITIVE<=0.06    * STAPHYLOCOCCUS AUREUS  Blood Culture ID Panel (Reflexed)     Status: Abnormal   Collection Time: 03/04/22  1:52 PM  Result Value Ref Range Status   Enterococcus faecalis NOT DETECTED NOT DETECTED Final   Enterococcus Faecium NOT DETECTED NOT DETECTED Final   Listeria monocytogenes NOT DETECTED NOT DETECTED Final   Staphylococcus species NOT DETECTED NOT DETECTED Final   Staphylococcus aureus (BCID) NOT DETECTED NOT DETECTED Final   Staphylococcus epidermidis NOT DETECTED NOT DETECTED Final   Staphylococcus lugdunensis NOT DETECTED NOT DETECTED Final   Streptococcus species DETECTED (A) NOT DETECTED Final  Comment: CRITICAL RESULT CALLED TO, READ BACK BY AND VERIFIED WITH: JASON  BELEUE '@[id number]'  ON 03/05/22 SKL    Streptococcus agalactiae DETECTED (A) NOT DETECTED Final    Comment: CRITICAL RESULT CALLED TO, READ BACK BY AND VERIFIED WITH: JASON BELEUE '@0217'  ON 03/05/22 SKL    Streptococcus pneumoniae NOT DETECTED NOT DETECTED Final   Streptococcus pyogenes NOT DETECTED NOT DETECTED Final   A.calcoaceticus-baumannii NOT DETECTED NOT DETECTED Final   Bacteroides fragilis NOT DETECTED NOT DETECTED Final   Enterobacterales NOT DETECTED NOT DETECTED Final   Enterobacter cloacae complex NOT DETECTED NOT DETECTED Final   Escherichia coli NOT DETECTED NOT DETECTED Final   Klebsiella aerogenes NOT DETECTED NOT DETECTED Final   Klebsiella oxytoca NOT DETECTED NOT DETECTED Final   Klebsiella pneumoniae NOT DETECTED NOT DETECTED Final   Proteus species NOT DETECTED NOT DETECTED Final   Salmonella species NOT DETECTED NOT DETECTED Final   Serratia marcescens NOT DETECTED NOT DETECTED Final   Haemophilus influenzae NOT DETECTED NOT DETECTED Final   Neisseria meningitidis NOT DETECTED NOT DETECTED Final   Pseudomonas aeruginosa NOT DETECTED NOT DETECTED Final   Stenotrophomonas maltophilia NOT DETECTED NOT DETECTED Final   Candida albicans NOT DETECTED NOT DETECTED Final   Candida auris NOT DETECTED NOT DETECTED Final   Candida glabrata NOT DETECTED NOT DETECTED Final   Candida krusei NOT DETECTED NOT DETECTED Final   Candida parapsilosis NOT DETECTED NOT DETECTED Final   Candida tropicalis NOT DETECTED NOT DETECTED Final   Cryptococcus neoformans/gattii NOT DETECTED NOT DETECTED Final    Comment: Performed at [facility], [address] [place], [place] [address]  Blood Culture (routine x 2)     Status: Abnormal   Collection Time: 03/04/22  1:57 PM   Specimen: BLOOD  Result Value Ref Range Status   Specimen Description BLOOD BLOOD RIGHT FOREARM  Final   Special Requests   Final    BOTTLES DRAWN AEROBIC AND ANAEROBIC Blood Culture results may not be optimal due  to an inadequate volume of blood received in culture bottles   Culture  Setup Time   Final    GRAM POSITIVE COCCI IN BOTH AEROBIC AND ANAEROBIC BOTTLES CRITICAL VALUE NOTED.  VALUE IS CONSISTENT WITH PREVIOUSLY REPORTED AND CALLED VALUE.    Culture (A)  Final    GROUP B STREP(S.AGALACTIAE)ISOLATED STAPHYLOCOCCUS AUREUS CRITICAL RESULT CALLED TO, READ BACK BY AND VERIFIED WITH: B. BIERS PHARMD, AT 3419 03/07/22 BY D. VANHOOK SUSCEPTIBILITIES PERFORMED ON PREVIOUS CULTURE WITHIN THE LAST 5 DAYS. Performed at [facility], [place] [address], [place], [place] [address]    Report Status 03/08/2022 FINAL  Final  Resp Panel by RT-PCR (Flu A&B, Covid) Anterior Nasal Swab     Status: None   Collection Time: 03/04/22  2:12 PM   Specimen: Anterior Nasal Swab  Result Value Ref Range Status   SARS Coronavirus 2 by RT PCR NEGATIVE NEGATIVE Final    Comment: (NOTE) SARS-CoV-2 target nucleic acids are NOT DETECTED.  The SARS-CoV-2 RNA is generally detectable in upper respiratory specimens during the acute phase of infection. The lowest concentration of SARS-CoV-2 viral copies this assay can detect is 138 copies/mL. A negative result does not preclude SARS-Cov-2 infection and should not be used as the sole basis for treatment or other patient management decisions. A negative result may occur with  improper specimen collection/handling, submission of specimen other than nasopharyngeal swab, presence of viral mutation(s) within the areas targeted by this assay, and inadequate number  of viral copies(<138 copies/mL). A negative result must be combined with clinical observations, patient history, and epidemiological information. The expected result is Negative.  Fact Sheet for Patients:  EntrepreneurPulse.com.au  Fact Sheet for Healthcare Providers:  IncredibleEmployment.be  This test is no t yet approved or cleared by the [place] FDA and  has been  authorized for detection and/or diagnosis of SARS-CoV-2 by FDA under an Emergency Use Authorization (EUA). This EUA Steve Vance remain  in effect (meaning this test can be used) for the duration of the COVID-19 declaration under Section 564(b)(1) of the Act, 21 U.S.C.section 360bbb-3(b)(1), unless the authorization is terminated  or revoked sooner.       Influenza A by PCR NEGATIVE NEGATIVE Final   Influenza B by PCR NEGATIVE NEGATIVE Final    Comment: (NOTE) The Xpert Xpress SARS-CoV-2/FLU/RSV plus assay is intended as an aid in the diagnosis of influenza from Nasopharyngeal swab specimens and should not be used as a sole basis for treatment. Nasal washings and aspirates are unacceptable for Xpert Xpress SARS-CoV-2/FLU/RSV testing.  Fact Sheet for Patients: EntrepreneurPulse.com.au  Fact Sheet for Healthcare Providers: IncredibleEmployment.be  This test is not yet approved or cleared by the [place] FDA and has been authorized for detection and/or diagnosis of SARS-CoV-2 by FDA under an Emergency Use Authorization (EUA). This EUA Steve Vance remain in effect (meaning this test can be used) for the duration of the COVID-19 declaration under Section 564(b)(1) of the Act, 21 U.S.C. section 360bbb-3(b)(1), unless the authorization is terminated or revoked.  Performed at [facility], [place], [place] [address]   C Difficile Quick Screen w PCR reflex     Status: None   Collection Time: 03/04/22  9:21 PM   Specimen: Stool  Result Value Ref Range Status   C Diff antigen NEGATIVE NEGATIVE Final   C Diff toxin NEGATIVE NEGATIVE Final   C Diff interpretation No C. difficile detected.  Final    Comment: Performed at [facility], [address] [place], [place] [address]  Gastrointestinal Panel by PCR , Stool     Status: None   Collection Time: 03/04/22  9:21 PM   Specimen: Stool  Result Value Ref Range Status    Campylobacter species NOT DETECTED NOT DETECTED Final   Plesimonas shigelloides NOT DETECTED NOT DETECTED Final   Salmonella species NOT DETECTED NOT DETECTED Final   Yersinia enterocolitica NOT DETECTED NOT DETECTED Final   Vibrio species NOT DETECTED NOT DETECTED Final   Vibrio cholerae NOT DETECTED NOT DETECTED Final   Enteroaggregative E coli (EAEC) NOT DETECTED NOT DETECTED Final   Enteropathogenic E coli (EPEC) NOT DETECTED NOT DETECTED Final   Enterotoxigenic E coli (ETEC) NOT DETECTED NOT DETECTED Final   Shiga like toxin producing E coli (STEC) NOT DETECTED NOT DETECTED Final   Shigella/Enteroinvasive E coli (EIEC) NOT DETECTED NOT DETECTED Final   Cryptosporidium NOT DETECTED NOT DETECTED Final   Cyclospora cayetanensis NOT DETECTED NOT DETECTED Final   Entamoeba histolytica NOT DETECTED NOT DETECTED Final   Giardia lamblia NOT DETECTED NOT DETECTED Final   Adenovirus F40/41 NOT DETECTED NOT DETECTED Final   Astrovirus NOT DETECTED NOT DETECTED Final   Norovirus GI/GII NOT DETECTED NOT DETECTED Final   Rotavirus A NOT DETECTED NOT DETECTED Final   Sapovirus (I, II, IV, and V) NOT DETECTED NOT DETECTED Final    Comment: Performed at [facility], [address] [place], [place] [address]  Urine Culture  Status: Abnormal   Collection Time: 03/04/22  9:39 PM   Specimen: In/Out Cath Urine  Result Value Ref Range Status   Specimen Description   Final    IN/OUT CATH URINE Performed at [facility], [address] [place], [place] [address]    Special Requests   Final    NONE Performed at [facility], [address] [place], [place] [address]    Culture MULTIPLE SPECIES PRESENT, SUGGEST RECOLLECTION (A)  Final   Report Status 03/07/2022 FINAL  Final  Culture, blood (Routine X 2) w Reflex to ID Panel     Status: None (Preliminary result)   Collection Time: 03/06/22  4:50 AM   Specimen: BLOOD  Result Value Ref Range Status   Specimen  Description BLOOD RIGHT ANTECUBITAL  Final   Special Requests   Final    BOTTLES DRAWN AEROBIC AND ANAEROBIC Blood Culture adequate volume   Culture   Final    NO GROWTH 4 DAYS Performed at [facility], [address] [place], [place] [address]    Report Status PENDING  Incomplete  Culture, blood (Routine X 2) w Reflex to ID Panel     Status: None (Preliminary result)   Collection Time: 03/06/22  4:50 AM   Specimen: BLOOD  Result Value Ref Range Status   Specimen Description BLOOD BLOOD RIGHT WRIST  Final   Special Requests   Final    BOTTLES DRAWN AEROBIC AND ANAEROBIC Blood Culture adequate volume   Culture   Final    NO GROWTH 4 DAYS Performed at [facility], [address] [place], [place] [address]    Report Status PENDING  Incomplete     Labs: BNP (last 3 results) Recent Labs    04/28/21 1816 08/14/21 0902 03/04/22 1428  BNP 2,177.0* 917.1* 008.6*   Basic Metabolic Panel: Recent Labs  Lab 03/05/22 0613 03/07/22 0804 03/08/22 0503 03/09/22 0617 03/10/22 0628  NA 136 137 135 133* 136  K 4.5 4.3 4.5 4.4 4.6  CL 97* 98 96* 96* 99  CO2 '26 27 25 22 26  ' GLUCOSE 109* 83 89 118* 91  BUN 29* 38* 56* 68* 49*  CREATININE 7.08* 7.36* 9.05* 10.83* 9.01*  CALCIUM 8.5* 8.7* 8.9 8.9 8.9   Liver Function Tests: Recent Labs  Lab 03/04/22 1412  AST 23  ALT 20  ALKPHOS 85  BILITOT 1.1  PROT 8.6*  ALBUMIN 3.8   No results for input(s): "LIPASE", "AMYLASE" in the last 168 hours. No results for input(s): "AMMONIA" in the last 168 hours. CBC: Recent Labs  Lab 03/04/22 1412 03/05/22 7619 03/07/22 0804 03/08/22 0503 03/09/22 0617 03/10/22 0628  WBC 5.6 8.6 5.3 5.4 5.3 5.0  NEUTROABS 5.1  --   --   --   --   --   HGB 12.3* 10.6* 11.0* 11.3* 11.6* 11.5*  HCT 40.2 34.1* 36.3* 36.4* 37.0* 37.0*  MCV 87.8 86.5 85.8 85.4 84.5 84.9  PLT 181 169 187 219 209 210   Cardiac Enzymes: No results for input(s): "CKTOTAL", "CKMB", "CKMBINDEX",  "TROPONINI" in the last 168 hours. BNP: Invalid input(s): "POCBNP" CBG: No results for input(s): "GLUCAP" in the last 168 hours. D-Dimer No results for input(s): "DDIMER" in the last 72 hours. Hgb A1c No results for input(s): "HGBA1C" in the last 72 hours. Lipid Profile No results for input(s): "CHOL", "HDL", "LDLCALC", "TRIG", "CHOLHDL", "LDLDIRECT" in the last 72 hours. Thyroid function studies No results for input(s): "TSH", "T4TOTAL", "T3FREE", "THYROIDAB"  in the last 72 hours.  Invalid input(s): "FREET3" Anemia work up No results for input(s): "VITAMINB12", "FOLATE", "FERRITIN", "TIBC", "IRON", "RETICCTPCT" in the last 72 hours. Urinalysis    Component Value Date/Time   COLORURINE YELLOW (A) 03/04/2022 2139   APPEARANCEUR CLOUDY (A) 03/04/2022 2139   APPEARANCEUR Cloudy (A) 07/30/2019 1301   LABSPEC 1.016 03/04/2022 2139   PHURINE 7.0 03/04/2022 2139   GLUCOSEU 50 (A) 03/04/2022 2139   HGBUR MODERATE (A) 03/04/2022 2139   BILIRUBINUR NEGATIVE 03/04/2022 2139   BILIRUBINUR Negative 07/30/2019 1301   KETONESUR NEGATIVE 03/04/2022 2139   PROTEINUR >=300 (A) 03/04/2022 2139   NITRITE NEGATIVE 03/04/2022 2139   LEUKOCYTESUR MODERATE (A) 03/04/2022 2139   Sepsis Labs Recent Labs  Lab 03/07/22 0804 03/08/22 0503 03/09/22 0617 03/10/22 0628  WBC 5.3 5.4 5.3 5.0   Microbiology Recent Results (from the past 240 hour(s))  Blood Culture (routine x 2)     Status: Abnormal   Collection Time: 03/04/22  1:52 PM   Specimen: BLOOD  Result Value Ref Range Status   Specimen Description   Final    BLOOD RAC Performed at [facility], [address], [place], [place] [address]    Special Requests   Final    BOTTLES DRAWN AEROBIC AND ANAEROBIC BCAV Performed at [facility], [address] [place], [place] [address]    Culture  Setup Time   Final    Organism ID to follow GRAM POSITIVE COCCI IN BOTH AEROBIC AND ANAEROBIC BOTTLES CRITICAL RESULT CALLED  TO, READ BACK BY AND VERIFIED WITH: JASON BELEUE '@0217'  ON 03/05/22 SKL GRAM STAIN REVIEWED-AGREE WITH RESULT    Culture (A)  Final    GROUP B STREP(S.AGALACTIAE)ISOLATED STAPHYLOCOCCUS AUREUS CRITICAL RESULT CALLED TO, READ BACK BY AND VERIFIED WITH: B. BIERS PHARMD, AT 4132 03/07/22 D. VANHOOK Performed at [facility], [place] [address], [place],  [address]    Report Status 03/08/2022 FINAL  Final   Organism ID, Bacteria GROUP B STREP(S.AGALACTIAE)ISOLATED  Final   Organism ID, Bacteria STAPHYLOCOCCUS AUREUS  Final      Susceptibility   Group b strep(s.agalactiae)isolated - MIC*    CLINDAMYCIN <=0.25 SENSITIVE Sensitive     AMPICILLIN <=0.25 SENSITIVE Sensitive     ERYTHROMYCIN <=0.12 SENSITIVE Sensitive     VANCOMYCIN 0.5 SENSITIVE Sensitive     CEFTRIAXONE <=0.12 SENSITIVE Sensitive     LEVOFLOXACIN 1 SENSITIVE Sensitive     PENICILLIN Value in next row Sensitive      SENSITIVE<=0.06    * GROUP B STREP(S.AGALACTIAE)ISOLATED   Staphylococcus aureus - MIC*    CIPROFLOXACIN Value in next row Sensitive      SENSITIVE<=0.06    ERYTHROMYCIN Value in next row Sensitive      SENSITIVE<=0.06    GENTAMICIN Value in next row Sensitive      SENSITIVE<=0.06    OXACILLIN Value in next row Sensitive      SENSITIVE<=0.06    TETRACYCLINE Value in next row Sensitive      SENSITIVE<=0.06    VANCOMYCIN Value in next row Sensitive      SENSITIVE<=0.06    TRIMETH/SULFA Value in next row Sensitive      SENSITIVE<=0.06    CLINDAMYCIN Value in next row Sensitive      SENSITIVE<=0.06    RIFAMPIN Value in next row Sensitive      SENSITIVE<=0.06    Inducible Clindamycin Value in next row Sensitive      SENSITIVE<=0.06    *  STAPHYLOCOCCUS AUREUS  Blood Culture ID Panel (Reflexed)     Status: Abnormal   Collection Time: 03/04/22  1:52 PM  Result Value Ref Range Status   Enterococcus faecalis NOT DETECTED NOT DETECTED Final   Enterococcus Faecium NOT DETECTED NOT DETECTED Final    Listeria monocytogenes NOT DETECTED NOT DETECTED Final   Staphylococcus species NOT DETECTED NOT DETECTED Final   Staphylococcus aureus (BCID) NOT DETECTED NOT DETECTED Final   Staphylococcus epidermidis NOT DETECTED NOT DETECTED Final   Staphylococcus lugdunensis NOT DETECTED NOT DETECTED Final   Streptococcus species DETECTED (A) NOT DETECTED Final    Comment: CRITICAL RESULT CALLED TO, READ BACK BY AND VERIFIED WITH: JASON BELEUE '@0217'  ON 03/05/22 SKL    Streptococcus agalactiae DETECTED (A) NOT DETECTED Final    Comment: CRITICAL RESULT CALLED TO, READ BACK BY AND VERIFIED WITH: JASON BELEUE '@0217'  ON 03/05/22 SKL    Streptococcus pneumoniae NOT DETECTED NOT DETECTED Final   Streptococcus pyogenes NOT DETECTED NOT DETECTED Final   A.calcoaceticus-baumannii NOT DETECTED NOT DETECTED Final   Bacteroides fragilis NOT DETECTED NOT DETECTED Final   Enterobacterales NOT DETECTED NOT DETECTED Final   Enterobacter cloacae complex NOT DETECTED NOT DETECTED Final   Escherichia coli NOT DETECTED NOT DETECTED Final   Klebsiella aerogenes NOT DETECTED NOT DETECTED Final   Klebsiella oxytoca NOT DETECTED NOT DETECTED Final   Klebsiella pneumoniae NOT DETECTED NOT DETECTED Final   Proteus species NOT DETECTED NOT DETECTED Final   Salmonella species NOT DETECTED NOT DETECTED Final   Serratia marcescens NOT DETECTED NOT DETECTED Final   Haemophilus influenzae NOT DETECTED NOT DETECTED Final   Neisseria meningitidis NOT DETECTED NOT DETECTED Final   Pseudomonas aeruginosa NOT DETECTED NOT DETECTED Final   Stenotrophomonas maltophilia NOT DETECTED NOT DETECTED Final   Candida albicans NOT DETECTED NOT DETECTED Final   Candida auris NOT DETECTED NOT DETECTED Final   Candida glabrata NOT DETECTED NOT DETECTED Final   Candida krusei NOT DETECTED NOT DETECTED Final   Candida parapsilosis NOT DETECTED NOT DETECTED Final   Candida tropicalis NOT DETECTED NOT DETECTED Final   Cryptococcus  neoformans/gattii NOT DETECTED NOT DETECTED Final    Comment: Performed at [facility], [address] [place], [place] [address]  Blood Culture (routine x 2)     Status: Abnormal   Collection Time: 03/04/22  1:57 PM   Specimen: BLOOD  Result Value Ref Range Status   Specimen Description BLOOD BLOOD RIGHT FOREARM  Final   Special Requests   Final    BOTTLES DRAWN AEROBIC AND ANAEROBIC Blood Culture results may not be optimal due to an inadequate volume of blood received in culture bottles   Culture  Setup Time   Final    GRAM POSITIVE COCCI IN BOTH AEROBIC AND ANAEROBIC BOTTLES CRITICAL VALUE NOTED.  VALUE IS CONSISTENT WITH PREVIOUSLY REPORTED AND CALLED VALUE.    Culture (A)  Final    GROUP B STREP(S.AGALACTIAE)ISOLATED STAPHYLOCOCCUS AUREUS CRITICAL RESULT CALLED TO, READ BACK BY AND VERIFIED WITH: B. BIERS PHARMD, AT [id number] 03/07/22 BY D. VANHOOK SUSCEPTIBILITIES PERFORMED ON PREVIOUS CULTURE WITHIN THE LAST 5 DAYS. Performed at [facility], [place] [address] [place], [place] [address]    Report Status 03/08/2022 FINAL  Final  Resp Panel by RT-PCR (Flu A&B, Covid) Anterior Nasal Swab     Status: None   Collection Time: 03/04/22  2:12 PM   Specimen: Anterior Nasal Swab  Result Value Ref Range Status   SARS Coronavirus 2  by RT PCR NEGATIVE NEGATIVE Final    Comment: (NOTE) SARS-CoV-2 target nucleic acids are NOT DETECTED.  The SARS-CoV-2 RNA is generally detectable in upper respiratory specimens during the acute phase of infection. The lowest concentration of SARS-CoV-2 viral copies this assay can detect is 138 copies/mL. A negative result does not preclude SARS-Cov-2 infection and should not be used as the sole basis for treatment or other patient management decisions. A negative result may occur with  improper specimen collection/handling, submission of specimen other than nasopharyngeal swab, presence of viral mutation(s) within the areas targeted by this  assay, and inadequate number of viral copies(<138 copies/mL). A negative result must be combined with clinical observations, patient history, and epidemiological information. The expected result is Negative.  Fact Sheet for Patients:  EntrepreneurPulse.com.au  Fact Sheet for Healthcare Providers:  IncredibleEmployment.be  This test is no t yet approved or cleared by the [place] FDA and  has been authorized for detection and/or diagnosis of SARS-CoV-2 by FDA under an Emergency Use Authorization (EUA). This EUA Steve Vance remain  in effect (meaning this test can be used) for the duration of the COVID-19 declaration under Section 564(b)(1) of the Act, 21 U.S.C.section 360bbb-3(b)(1), unless the authorization is terminated  or revoked sooner.       Influenza A by PCR NEGATIVE NEGATIVE Final   Influenza B by PCR NEGATIVE NEGATIVE Final    Comment: (NOTE) The Xpert Xpress SARS-CoV-2/FLU/RSV plus assay is intended as an aid in the diagnosis of influenza from Nasopharyngeal swab specimens and should not be used as a sole basis for treatment. Nasal washings and aspirates are unacceptable for Xpert Xpress SARS-CoV-2/FLU/RSV testing.  Fact Sheet for Patients: EntrepreneurPulse.com.au  Fact Sheet for Healthcare Providers: IncredibleEmployment.be  This test is not yet approved or cleared by the [place] FDA and has been authorized for detection and/or diagnosis of SARS-CoV-2 by FDA under an Emergency Use Authorization (EUA). This EUA Steve Vance remain in effect (meaning this test can be used) for the duration of the COVID-19 declaration under Section 564(b)(1) of the Act, 21 U.S.C. section 360bbb-3(b)(1), unless the authorization is terminated or revoked.  Performed at [facility], [place], [place] [address]   C Difficile Quick Screen w PCR reflex     Status: None   Collection Time:  03/04/22  9:21 PM   Specimen: Stool  Result Value Ref Range Status   C Diff antigen NEGATIVE NEGATIVE Final   C Diff toxin NEGATIVE NEGATIVE Final   C Diff interpretation No C. difficile detected.  Final    Comment: Performed at [facility], [address] [place], [place] [address]  Gastrointestinal Panel by PCR , Stool     Status: None   Collection Time: 03/04/22  9:21 PM   Specimen: Stool  Result Value Ref Range Status   Campylobacter species NOT DETECTED NOT DETECTED Final   Plesimonas shigelloides NOT DETECTED NOT DETECTED Final   Salmonella species NOT DETECTED NOT DETECTED Final   Yersinia enterocolitica NOT DETECTED NOT DETECTED Final   Vibrio species NOT DETECTED NOT DETECTED Final   Vibrio cholerae NOT DETECTED NOT DETECTED Final   Enteroaggregative E coli (EAEC) NOT DETECTED NOT DETECTED Final   Enteropathogenic E coli (EPEC) NOT DETECTED NOT DETECTED Final   Enterotoxigenic E coli (ETEC) NOT DETECTED NOT DETECTED Final   Shiga like toxin producing E coli (STEC) NOT DETECTED NOT DETECTED Final   Shigella/Enteroinvasive E coli (EIEC) NOT DETECTED NOT DETECTED Final  Cryptosporidium NOT DETECTED NOT DETECTED Final   Cyclospora cayetanensis NOT DETECTED NOT DETECTED Final   Entamoeba histolytica NOT DETECTED NOT DETECTED Final   Giardia lamblia NOT DETECTED NOT DETECTED Final   Adenovirus F40/41 NOT DETECTED NOT DETECTED Final   Astrovirus NOT DETECTED NOT DETECTED Final   Norovirus GI/GII NOT DETECTED NOT DETECTED Final   Rotavirus A NOT DETECTED NOT DETECTED Final   Sapovirus (I, II, IV, and V) NOT DETECTED NOT DETECTED Final    Comment: Performed at [facility], [address] [place], [place] [address]  Urine Culture     Status: Abnormal   Collection Time: 03/04/22  9:39 PM   Specimen: In/Out Cath Urine  Result Value Ref Range Status   Specimen Description   Final    IN/OUT CATH URINE Performed at [facility], [address]  [place], [place] [address]    Special Requests   Final    NONE Performed at [facility], [address] [place], [place] [address]    Culture MULTIPLE SPECIES PRESENT, SUGGEST RECOLLECTION (A)  Final   Report Status 03/07/2022 FINAL  Final  Culture, blood (Routine X 2) w Reflex to ID Panel     Status: None (Preliminary result)   Collection Time: 03/06/22  4:50 AM   Specimen: BLOOD  Result Value Ref Range Status   Specimen Description BLOOD RIGHT ANTECUBITAL  Final   Special Requests   Final    BOTTLES DRAWN AEROBIC AND ANAEROBIC Blood Culture adequate volume   Culture   Final    NO GROWTH 4 DAYS Performed at [facility], [address], [place], [place] [address]    Report Status PENDING  Incomplete  Culture, blood (Routine X 2) w Reflex to ID Panel     Status: None (Preliminary result)   Collection Time: 03/06/22  4:50 AM   Specimen: BLOOD  Result Value Ref Range Status   Specimen Description BLOOD BLOOD RIGHT WRIST  Final   Special Requests   Final    BOTTLES DRAWN AEROBIC AND ANAEROBIC Blood Culture adequate volume   Culture   Final    NO GROWTH 4 DAYS Performed at [facility], [address], [place], [place] [address]    Report Status PENDING  Incomplete   Imaging ECHO TEE  Result Date: 03/10/2022    TRANSESOPHOGEAL ECHO REPORT   Patient Name:   TREVYN LUMPKIN Date of Exam: 03/10/2022 Medical Rec #:  [id number]     Height:       67.0 in Accession #:    [id number]    Weight:       137.3 lb Date of Birth:  May 30, 1961    BSA:          1.724 m Patient Age:    61 years      BP:           161/97 mmHg Patient Gender: M             HR:           92 bpm. Exam Location:  [facility] Procedure: Transesophageal Echo, Color Doppler, Cardiac Doppler and Saline            Contrast Bubble Study Indications:     Bacteremia R78.81  History:         Patient has prior history of Echocardiogram examinations, most                  recent 03/07/2022.  CHF; Risk Factors:Hypertension  and                  Dyslipidemia.  Sonographer:     Sherrie Sport Referring Phys:  [id number] [place] Diagnosing Phys: Kate Sable MD PROCEDURE: The transesophogeal probe was passed without difficulty through the esophogus of the patient. Sedation performed by performing physician. The patient developed no complications during the procedure. IMPRESSIONS  1. Left ventricular ejection fraction, by estimation, is 45 to 50%. The left ventricle has mildly decreased function.  2. Right ventricular systolic function is low normal. The right ventricular size is normal.  3. Left atrial size was mildly dilated. No left atrial/left atrial appendage thrombus was detected.  4. Right atrial size was mildly dilated.  5. The mitral valve is normal in structure. Mild to moderate mitral valve regurgitation.  6. The aortic valve is tricuspid. Aortic valve regurgitation is not visualized.  7. Cannot exclude a small PFO. Agitated saline contrast bubble study was positive with shunting observed within 3-6 cardiac cycles suggestive of interatrial shunt. Conclusion(s)/Recommendation(s): No evidence of vegetation/infective endocarditis on this transesophageael echocardiogram. FINDINGS  Left Ventricle: Left ventricular ejection fraction, by estimation, is 45 to 50%. The left ventricle has mildly decreased function. The left ventricular internal cavity size was normal in size. Right Ventricle: The right ventricular size is normal. No increase in right ventricular wall thickness. Right ventricular systolic function is low normal. Left Atrium: Left atrial size was mildly dilated. No left atrial/left atrial appendage thrombus was detected. Right Atrium: Right atrial size was mildly dilated. Pericardium: There is no evidence of pericardial effusion. Mitral Valve: The mitral valve is normal in structure. Mild to moderate mitral valve regurgitation. Tricuspid Valve: The tricuspid valve is normal in structure. Tricuspid valve regurgitation is  mild. Aortic Valve: The aortic valve is tricuspid. Aortic valve regurgitation is not visualized. Pulmonic Valve: The pulmonic valve was normal in structure. Pulmonic valve regurgitation is not visualized. Aorta: The aortic root is normal in size and structure. IAS/Shunts: Cannot exclude a small PFO. Agitated saline contrast was given intravenously to evaluate for intracardiac shunting. Agitated saline contrast bubble study was positive with shunting observed within 3-6 cardiac cycles suggestive of interatrial shunt. Kate Sable MD Electronically signed by Kate Sable MD Signature Date/Time: 03/10/2022/3:38:30 PM    Final       Time coordinating discharge: Over 30 minutes  SIGNED:  Emeterio Reeve DO [facility]

## 2022-03-11 ENCOUNTER — Encounter: Payer: Self-pay | Admitting: Cardiology

## 2022-03-11 LAB — CULTURE, BLOOD (ROUTINE X 2)
Culture: NO GROWTH
Culture: NO GROWTH
Special Requests: ADEQUATE
Special Requests: ADEQUATE

## 2022-03-30 ENCOUNTER — Inpatient Hospital Stay: Payer: Medicare Other | Admitting: Infectious Diseases

## 2022-04-29 ENCOUNTER — Other Ambulatory Visit: Payer: Self-pay

## 2022-04-29 ENCOUNTER — Inpatient Hospital Stay: Payer: Medicare Other | Admitting: Anesthesiology

## 2022-04-29 ENCOUNTER — Inpatient Hospital Stay
Admission: EM | Admit: 2022-04-29 | Discharge: 2022-05-04 | DRG: 480 | Disposition: A | Discharge status: Skilled Nursing Facility | Payer: Medicare Other | Attending: Internal Medicine | Admitting: Internal Medicine

## 2022-04-29 ENCOUNTER — Emergency Department: Payer: Medicare Other

## 2022-04-29 ENCOUNTER — Encounter
Admission: EM | Disposition: A | Discharge status: Skilled Nursing Facility | Payer: Self-pay | Source: Home / Self Care | Attending: Internal Medicine

## 2022-04-29 ENCOUNTER — Inpatient Hospital Stay: Payer: Medicare Other

## 2022-04-29 DIAGNOSIS — K219 Gastro-esophageal reflux disease without esophagitis: Secondary | ICD-10-CM

## 2022-04-29 DIAGNOSIS — E1122 Type 2 diabetes mellitus with diabetic chronic kidney disease: Secondary | ICD-10-CM | POA: Diagnosis present

## 2022-04-29 DIAGNOSIS — I10 Essential (primary) hypertension: Secondary | ICD-10-CM | POA: Diagnosis present

## 2022-04-29 DIAGNOSIS — N186 End stage renal disease: Secondary | ICD-10-CM

## 2022-04-29 DIAGNOSIS — I5022 Chronic systolic (congestive) heart failure: Secondary | ICD-10-CM | POA: Diagnosis present

## 2022-04-29 DIAGNOSIS — D631 Anemia in chronic kidney disease: Secondary | ICD-10-CM | POA: Diagnosis present

## 2022-04-29 DIAGNOSIS — E8770 Fluid overload, unspecified: Secondary | ICD-10-CM | POA: Diagnosis not present

## 2022-04-29 DIAGNOSIS — S72142A Displaced intertrochanteric fracture of left femur, initial encounter for closed fracture: Secondary | ICD-10-CM | POA: Diagnosis present

## 2022-04-29 DIAGNOSIS — Z992 Dependence on renal dialysis: Secondary | ICD-10-CM

## 2022-04-29 DIAGNOSIS — E1169 Type 2 diabetes mellitus with other specified complication: Secondary | ICD-10-CM | POA: Diagnosis present

## 2022-04-29 DIAGNOSIS — M549 Dorsalgia, unspecified: Secondary | ICD-10-CM | POA: Diagnosis present

## 2022-04-29 DIAGNOSIS — M170 Bilateral primary osteoarthritis of knee: Secondary | ICD-10-CM | POA: Diagnosis present

## 2022-04-29 DIAGNOSIS — E785 Hyperlipidemia, unspecified: Secondary | ICD-10-CM | POA: Diagnosis present

## 2022-04-29 DIAGNOSIS — Z89421 Acquired absence of other right toe(s): Secondary | ICD-10-CM | POA: Diagnosis not present

## 2022-04-29 DIAGNOSIS — R112 Nausea with vomiting, unspecified: Secondary | ICD-10-CM | POA: Diagnosis not present

## 2022-04-29 DIAGNOSIS — E875 Hyperkalemia: Secondary | ICD-10-CM | POA: Diagnosis present

## 2022-04-29 DIAGNOSIS — I132 Hypertensive heart and chronic kidney disease with heart failure and with stage 5 chronic kidney disease, or end stage renal disease: Secondary | ICD-10-CM | POA: Diagnosis present

## 2022-04-29 DIAGNOSIS — W109XXA Fall (on) (from) unspecified stairs and steps, initial encounter: Secondary | ICD-10-CM | POA: Diagnosis present

## 2022-04-29 DIAGNOSIS — Z8249 Family history of ischemic heart disease and other diseases of the circulatory system: Secondary | ICD-10-CM

## 2022-04-29 DIAGNOSIS — S72002A Fracture of unspecified part of neck of left femur, initial encounter for closed fracture: Secondary | ICD-10-CM | POA: Diagnosis present

## 2022-04-29 DIAGNOSIS — Y92019 Unspecified place in single-family (private) house as the place of occurrence of the external cause: Secondary | ICD-10-CM | POA: Diagnosis not present

## 2022-04-29 DIAGNOSIS — G8929 Other chronic pain: Secondary | ICD-10-CM | POA: Diagnosis present

## 2022-04-29 HISTORY — PX: INTRAMEDULLARY (IM) NAIL INTERTROCHANTERIC: SHX5875

## 2022-04-29 LAB — CBC
HCT: 36.3 % — ABNORMAL LOW (ref 39.0–52.0)
HCT: 39.3 % (ref 39.0–52.0)
Hemoglobin: 11.5 g/dL — ABNORMAL LOW (ref 13.0–17.0)
Hemoglobin: 12.4 g/dL — ABNORMAL LOW (ref 13.0–17.0)
MCH: 28.1 pg (ref 26.0–34.0)
MCH: 28.2 pg (ref 26.0–34.0)
MCHC: 31.7 g/dL (ref 30.0–36.0)
MCHC: 31.6 g/dL (ref 30.0–36.0)
MCV: 88.8 fL (ref 80.0–100.0)
MCV: 89.3 fL (ref 80.0–100.0)
Platelets: 238 10*3/uL (ref 150–400)
Platelets: 211 10*3/uL (ref 150–400)
RBC: 4.09 MIL/uL — ABNORMAL LOW (ref 4.22–5.81)
RBC: 4.4 MIL/uL (ref 4.22–5.81)
RDW: 15.4 % (ref 11.5–15.5)
RDW: 15.3 % (ref 11.5–15.5)
WBC: 8 10*3/uL (ref 4.0–10.5)
WBC: 7.3 10*3/uL (ref 4.0–10.5)
nRBC: 0 % (ref 0.0–0.2)
nRBC: 0 % (ref 0.0–0.2)

## 2022-04-29 LAB — TYPE AND SCREEN
ABO/RH(D): AB POS
Antibody Screen: NEGATIVE

## 2022-04-29 LAB — COMPREHENSIVE METABOLIC PANEL
ALT: 16 U/L (ref 0–44)
AST: 27 U/L (ref 15–41)
Albumin: 3.3 g/dL — ABNORMAL LOW (ref 3.5–5.0)
Alkaline Phosphatase: 80 U/L (ref 38–126)
Anion gap: 18 — ABNORMAL HIGH (ref 5–15)
BUN: 70 mg/dL — ABNORMAL HIGH (ref 8–23)
CO2: 21 mmol/L — ABNORMAL LOW (ref 22–32)
Calcium: 8 mg/dL — ABNORMAL LOW (ref 8.9–10.3)
Chloride: 96 mmol/L — ABNORMAL LOW (ref 98–111)
Creatinine, Ser: 12.4 mg/dL — ABNORMAL HIGH (ref 0.61–1.24)
GFR, Estimated: 4 mL/min — ABNORMAL LOW (ref >60)
Glucose, Bld: 132 mg/dL — ABNORMAL HIGH (ref 70–99)
Potassium: 5.8 mmol/L — ABNORMAL HIGH (ref 3.5–5.1)
Sodium: 135 mmol/L (ref 135–145)
Total Bilirubin: 1 mg/dL (ref 0.3–1.2)
Total Protein: 8.3 g/dL — ABNORMAL HIGH (ref 6.5–8.1)

## 2022-04-29 LAB — BASIC METABOLIC PANEL
Anion gap: 14 (ref 5–15)
BUN: 64 mg/dL — ABNORMAL HIGH (ref 8–23)
CO2: 20 mmol/L — ABNORMAL LOW (ref 22–32)
Calcium: 7.3 mg/dL — ABNORMAL LOW (ref 8.9–10.3)
Chloride: 102 mmol/L (ref 98–111)
Creatinine, Ser: 11.04 mg/dL — ABNORMAL HIGH (ref 0.61–1.24)
GFR, Estimated: 5 mL/min — ABNORMAL LOW (ref >60)
Glucose, Bld: 118 mg/dL — ABNORMAL HIGH (ref 70–99)
Potassium: 5.3 mmol/L — ABNORMAL HIGH (ref 3.5–5.1)
Sodium: 136 mmol/L (ref 135–145)

## 2022-04-29 LAB — GLUCOSE, CAPILLARY: Glucose-Capillary: 98 mg/dL (ref 70–99)

## 2022-04-29 LAB — HEPATITIS C ANTIBODY: HCV Ab: REACTIVE — AB

## 2022-04-29 LAB — HEPATITIS B SURFACE ANTIBODY,QUALITATIVE: Hep B S Ab: NONREACTIVE

## 2022-04-29 LAB — TROPONIN I (HIGH SENSITIVITY)
Troponin I (High Sensitivity): 38 ng/L — ABNORMAL HIGH (ref <18)
Troponin I (High Sensitivity): 34 ng/L — ABNORMAL HIGH (ref <18)

## 2022-04-29 LAB — PROTIME-INR
INR: 1.1 (ref 0.8–1.2)
Prothrombin Time: 14.5 seconds (ref 11.4–15.2)

## 2022-04-29 LAB — HEPATITIS B CORE ANTIBODY, TOTAL: Hep B Core Total Ab: REACTIVE — AB

## 2022-04-29 LAB — HEPATITIS B SURFACE ANTIGEN: Hepatitis B Surface Ag: NONREACTIVE

## 2022-04-29 SURGERY — FIXATION, FRACTURE, INTERTROCHANTERIC, WITH INTRAMEDULLARY ROD
Anesthesia: General | Site: Hip | Laterality: Left

## 2022-04-29 MED ORDER — CEFAZOLIN SODIUM-DEXTROSE 2-4 GM/100ML-% IV SOLN: 2.0000 g | INTRAVENOUS | Status: DC

## 2022-04-29 MED ORDER — ALTEPLASE 2 MG IJ SOLR: 2.0000 mg | Freq: Once | INTRAMUSCULAR | Status: DC | PRN

## 2022-04-29 MED ORDER — LIDOCAINE HCL (CARDIAC) PF 100 MG/5ML IV SOSY
PREFILLED_SYRINGE | INTRAVENOUS | Status: DC | PRN
  Administered 2022-04-29: 100 mg via INTRAVENOUS

## 2022-04-29 MED ORDER — LISINOPRIL 10 MG PO TABS: 40.0000 mg | ORAL_TABLET | Freq: Every day | ORAL | Status: DC

## 2022-04-29 MED ORDER — SODIUM CHLORIDE 0.9 % IV SOLN: INTRAVENOUS | Status: DC | PRN

## 2022-04-29 MED ORDER — DROPERIDOL 2.5 MG/ML IJ SOLN: 0.6250 mg | Freq: Once | INTRAMUSCULAR | Status: DC | PRN

## 2022-04-29 MED ORDER — LIDOCAINE-PRILOCAINE 2.5-2.5 % EX CREA: 1.0000 | TOPICAL_CREAM | CUTANEOUS | Status: DC | PRN

## 2022-04-29 MED ORDER — LABETALOL HCL 5 MG/ML IV SOLN
5.0000 mg | INTRAVENOUS | Status: DC | PRN
  Administered 2022-04-29: 5 mg via INTRAVENOUS

## 2022-04-29 MED ORDER — FENTANYL CITRATE (PF) 100 MCG/2ML IJ SOLN
INTRAMUSCULAR | Status: DC | PRN
  Administered 2022-04-29: 100 ug via INTRAVENOUS
  Administered 2022-04-29 (×2): 50 ug via INTRAVENOUS

## 2022-04-29 MED ORDER — AMLODIPINE BESYLATE 10 MG PO TABS
10.0000 mg | ORAL_TABLET | Freq: Every day | ORAL | Status: DC
  Administered 2022-04-29 – 2022-05-04 (×5): 10 mg via ORAL
  Filled 2022-04-29: qty 1
  Filled 2022-04-29: qty 2
  Filled 2022-04-29 (×4): qty 1

## 2022-04-29 MED ORDER — 0.9 % SODIUM CHLORIDE (POUR BTL) OPTIME
TOPICAL | Status: DC | PRN
  Administered 2022-04-29: 500 mL

## 2022-04-29 MED ORDER — PHENYLEPHRINE 80 MCG/ML (10ML) SYRINGE FOR IV PUSH (FOR BLOOD PRESSURE SUPPORT)
PREFILLED_SYRINGE | INTRAVENOUS | Status: DC | PRN
  Administered 2022-04-29 (×3): 80 ug via INTRAVENOUS

## 2022-04-29 MED ORDER — ALBUTEROL SULFATE (2.5 MG/3ML) 0.083% IN NEBU: 2.5000 mg | INHALATION_SOLUTION | RESPIRATORY_TRACT | Status: DC | PRN

## 2022-04-29 MED ORDER — LABETALOL HCL 5 MG/ML IV SOLN
INTRAVENOUS | Status: AC
  Administered 2022-04-29: 5 mg via INTRAVENOUS
  Filled 2022-04-29: qty 4

## 2022-04-29 MED ORDER — FAMOTIDINE 20 MG PO TABS
20.0000 mg | ORAL_TABLET | Freq: Every day | ORAL | Status: DC
  Administered 2022-04-30 – 2022-05-04 (×4): 20 mg via ORAL
  Filled 2022-04-29 (×5): qty 1

## 2022-04-29 MED ORDER — ACETAMINOPHEN 10 MG/ML IV SOLN
INTRAVENOUS | Status: AC
  Filled 2022-04-29: qty 100

## 2022-04-29 MED ORDER — ANTICOAGULANT SODIUM CITRATE 4% (200MG/5ML) IV SOLN: 5.0000 mL | Status: DC | PRN

## 2022-04-29 MED ORDER — ISOSORBIDE MONONITRATE ER 30 MG PO TB24
30.0000 mg | ORAL_TABLET | Freq: Every day | ORAL | Status: DC
  Administered 2022-04-30 – 2022-05-04 (×4): 30 mg via ORAL
  Filled 2022-04-29 (×5): qty 1

## 2022-04-29 MED ORDER — MORPHINE SULFATE (PF) 2 MG/ML IV SOLN
0.5000 mg | INTRAVENOUS | Status: DC | PRN
  Administered 2022-04-29: 0.5 mg via INTRAVENOUS
  Filled 2022-04-29: qty 1

## 2022-04-29 MED ORDER — ACETAMINOPHEN 325 MG PO TABS: 650.0000 mg | ORAL_TABLET | ORAL | Status: DC | PRN

## 2022-04-29 MED ORDER — PHENYLEPHRINE HCL-NACL 20-0.9 MG/250ML-% IV SOLN
INTRAVENOUS | Status: DC | PRN
  Administered 2022-04-29: 10 ug/min via INTRAVENOUS

## 2022-04-29 MED ORDER — HYDROCODONE-ACETAMINOPHEN 5-325 MG PO TABS: 1.0000 | ORAL_TABLET | ORAL | 0 refills | Status: DC | PRN

## 2022-04-29 MED ORDER — LIDOCAINE HCL 1 % IJ SOLN
INTRAMUSCULAR | Status: DC | PRN
  Administered 2022-04-29: 40 mL via INTRAMUSCULAR

## 2022-04-29 MED ORDER — HEPARIN SODIUM (PORCINE) 1000 UNIT/ML DIALYSIS: 1000.0000 [IU] | INTRAMUSCULAR | Status: DC | PRN

## 2022-04-29 MED ORDER — ONDANSETRON HCL 4 MG/2ML IJ SOLN
4.0000 mg | INTRAMUSCULAR | Status: DC | PRN
  Administered 2022-04-29 – 2022-04-30 (×3): 4 mg via INTRAVENOUS
  Filled 2022-04-29 (×4): qty 2

## 2022-04-29 MED ORDER — CEFAZOLIN SODIUM-DEXTROSE 2-3 GM-%(50ML) IV SOLR
INTRAVENOUS | Status: DC | PRN
  Administered 2022-04-29: 2 g via INTRAVENOUS

## 2022-04-29 MED ORDER — HYDROCODONE-ACETAMINOPHEN 5-325 MG PO TABS
1.0000 | ORAL_TABLET | Freq: Four times a day (QID) | ORAL | Status: DC | PRN
  Administered 2022-04-29 (×2): 2 via ORAL
  Filled 2022-04-29 (×4): qty 2

## 2022-04-29 MED ORDER — CALCIUM ACETATE (PHOS BINDER) 667 MG PO CAPS
667.0000 mg | ORAL_CAPSULE | Freq: Three times a day (TID) | ORAL | Status: DC
  Administered 2022-04-30 – 2022-05-04 (×10): 667 mg via ORAL
  Filled 2022-04-29 (×14): qty 1

## 2022-04-29 MED ORDER — ONDANSETRON HCL 4 MG/2ML IJ SOLN
4.0000 mg | INTRAMUSCULAR | Status: AC
  Administered 2022-04-29: 4 mg via INTRAVENOUS
  Filled 2022-04-29: qty 2

## 2022-04-29 MED ORDER — HYDRALAZINE HCL 20 MG/ML IJ SOLN
10.0000 mg | INTRAMUSCULAR | Status: DC | PRN
Start: 2022-04-29 — End: 2022-05-04
  Administered 2022-04-29: 10 mg via INTRAVENOUS
  Filled 2022-04-29: qty 1

## 2022-04-29 MED ORDER — PROSOURCE PLUS PO LIQD
30.0000 mL | Freq: Three times a day (TID) | ORAL | Status: DC
  Administered 2022-04-30 – 2022-05-04 (×9): 30 mL via ORAL
  Filled 2022-04-29 (×15): qty 30

## 2022-04-29 MED ORDER — MAGNESIUM HYDROXIDE 400 MG/5ML PO SUSP: 30.0000 mL | Freq: Every day | ORAL | Status: DC | PRN

## 2022-04-29 MED ORDER — NEPRO/CARBSTEADY PO LIQD
237.0000 mL | Freq: Two times a day (BID) | ORAL | Status: DC
  Administered 2022-04-30 – 2022-05-04 (×6): 237 mL via ORAL

## 2022-04-29 MED ORDER — LIDOCAINE HCL (PF) 1 % IJ SOLN: 5.0000 mL | INTRAMUSCULAR | Status: DC | PRN

## 2022-04-29 MED ORDER — FENTANYL CITRATE (PF) 100 MCG/2ML IJ SOLN
INTRAMUSCULAR | Status: AC
  Filled 2022-04-29: qty 2

## 2022-04-29 MED ORDER — ONDANSETRON HCL 4 MG PO TABS: 4.0000 mg | ORAL_TABLET | Freq: Every day | ORAL | Status: DC | PRN

## 2022-04-29 MED ORDER — HYDRALAZINE HCL 20 MG/ML IJ SOLN
INTRAMUSCULAR | Status: AC
  Filled 2022-04-29: qty 1

## 2022-04-29 MED ORDER — CARVEDILOL 6.25 MG PO TABS
6.2500 mg | ORAL_TABLET | Freq: Two times a day (BID) | ORAL | Status: DC
  Administered 2022-04-29: 6.25 mg via ORAL
  Filled 2022-04-29: qty 1

## 2022-04-29 MED ORDER — PROMETHAZINE HCL 25 MG/ML IJ SOLN: 6.2500 mg | INTRAMUSCULAR | Status: DC | PRN

## 2022-04-29 MED ORDER — PROPOFOL 10 MG/ML IV BOLUS
INTRAVENOUS | Status: AC
  Filled 2022-04-29: qty 20

## 2022-04-29 MED ORDER — CHLORHEXIDINE GLUCONATE CLOTH 2 % EX PADS
6.0000 | MEDICATED_PAD | Freq: Every day | CUTANEOUS | Status: DC
  Administered 2022-04-30 – 2022-05-04 (×4): 6 via TOPICAL
  Filled 2022-04-29: qty 6

## 2022-04-29 MED ORDER — HYDROMORPHONE HCL 1 MG/ML IJ SOLN
1.0000 mg | INTRAMUSCULAR | Status: AC
  Administered 2022-04-29: 1 mg via INTRAVENOUS
  Filled 2022-04-29: qty 1

## 2022-04-29 MED ORDER — METOCLOPRAMIDE HCL 5 MG/ML IJ SOLN
5.0000 mg | Freq: Four times a day (QID) | INTRAMUSCULAR | Status: DC | PRN
  Administered 2022-04-30: 5 mg via INTRAVENOUS
  Filled 2022-04-29: qty 2

## 2022-04-29 MED ORDER — ALBUTEROL SULFATE HFA 108 (90 BASE) MCG/ACT IN AERS
1.0000 | INHALATION_SPRAY | RESPIRATORY_TRACT | Status: DC | PRN
Start: 2022-04-29 — End: 2022-04-29

## 2022-04-29 MED ORDER — ATORVASTATIN CALCIUM 10 MG PO TABS
10.0000 mg | ORAL_TABLET | Freq: Every day | ORAL | Status: DC
  Administered 2022-04-30 – 2022-05-04 (×4): 10 mg via ORAL
  Filled 2022-04-29 (×5): qty 1

## 2022-04-29 MED ORDER — NOREPINEPHRINE 4 MG/250ML-% IV SOLN
INTRAVENOUS | Status: AC
  Filled 2022-04-29: qty 250

## 2022-04-29 MED ORDER — RENA-VITE PO TABS
1.0000 | ORAL_TABLET | Freq: Every day | ORAL | Status: DC
  Administered 2022-04-30 – 2022-05-02 (×3): 1 via ORAL
  Filled 2022-04-29 (×5): qty 1

## 2022-04-29 MED ORDER — HYDRALAZINE HCL 20 MG/ML IJ SOLN: 10.0000 mg | Freq: Once | INTRAMUSCULAR | Status: DC

## 2022-04-29 MED ORDER — NITROGLYCERIN IN D5W 200-5 MCG/ML-% IV SOLN
INTRAVENOUS | Status: AC
  Filled 2022-04-29: qty 250

## 2022-04-29 MED ORDER — OXYCODONE HCL 5 MG PO TABS: 5.0000 mg | ORAL_TABLET | Freq: Once | ORAL | Status: DC | PRN

## 2022-04-29 MED ORDER — PROPOFOL 10 MG/ML IV BOLUS
INTRAVENOUS | Status: DC | PRN
  Administered 2022-04-29: 100 mg via INTRAVENOUS

## 2022-04-29 MED ORDER — SUGAMMADEX SODIUM 200 MG/2ML IV SOLN
INTRAVENOUS | Status: DC | PRN
  Administered 2022-04-29: 200 mg via INTRAVENOUS

## 2022-04-29 MED ORDER — FENTANYL CITRATE (PF) 100 MCG/2ML IJ SOLN: 25.0000 ug | INTRAMUSCULAR | Status: DC | PRN

## 2022-04-29 MED ORDER — PENTAFLUOROPROP-TETRAFLUOROETH EX AERO: 1.0000 | INHALATION_SPRAY | CUTANEOUS | Status: DC | PRN

## 2022-04-29 MED ORDER — CARVEDILOL 12.5 MG PO TABS
12.5000 mg | ORAL_TABLET | Freq: Two times a day (BID) | ORAL | Status: DC
  Administered 2022-04-29 – 2022-04-30 (×3): 12.5 mg via ORAL
  Filled 2022-04-29 (×3): qty 1

## 2022-04-29 MED ORDER — ROCURONIUM BROMIDE 100 MG/10ML IV SOLN
INTRAVENOUS | Status: DC | PRN
  Administered 2022-04-29 (×2): 20 mg via INTRAVENOUS
  Administered 2022-04-29: 60 mg via INTRAVENOUS

## 2022-04-29 MED ORDER — TRAZODONE HCL 50 MG PO TABS
25.0000 mg | ORAL_TABLET | Freq: Every evening | ORAL | Status: DC | PRN
  Administered 2022-05-01 – 2022-05-02 (×2): 25 mg via ORAL
  Filled 2022-04-29 (×2): qty 1

## 2022-04-29 MED ORDER — ACETAMINOPHEN 10 MG/ML IV SOLN
1000.0000 mg | Freq: Once | INTRAVENOUS | Status: DC | PRN
  Administered 2022-04-29: 1000 mg via INTRAVENOUS

## 2022-04-29 MED ORDER — OXYCODONE HCL 5 MG/5ML PO SOLN: 5.0000 mg | Freq: Once | ORAL | Status: DC | PRN

## 2022-04-29 SURGICAL SUPPLY — 54 items
BIT DRILL INTERTAN LAG SCREW (BIT) IMPLANT
BIT DRILL LONG 4.0 (BIT) IMPLANT
BNDG COHESIVE 6X5 TAN ST LF (GAUZE/BANDAGES/DRESSINGS) ×3 IMPLANT
CHLORAPREP W/TINT 26 (MISCELLANEOUS) ×1 IMPLANT
DRAPE 3/4 80X56 (DRAPES) ×2 IMPLANT
DRAPE C-ARM 42X72 X-RAY (DRAPES) ×1 IMPLANT
DRAPE SURG 17X11 SM STRL (DRAPES) ×2 IMPLANT
DRAPE U-SHAPE 47X51 STRL (DRAPES) ×1 IMPLANT
DRILL BIT LONG 4.0 (BIT) ×1
DRSG OPSITE POSTOP 3X4 (GAUZE/BANDAGES/DRESSINGS) ×2 IMPLANT
DRSG OPSITE POSTOP 4X14 (GAUZE/BANDAGES/DRESSINGS) IMPLANT
DRSG OPSITE POSTOP 4X6 (GAUZE/BANDAGES/DRESSINGS) ×1 IMPLANT
ELECT REM PT RETURN 9FT ADLT (ELECTROSURGICAL) ×1
ELECTRODE REM PT RTRN 9FT ADLT (ELECTROSURGICAL) ×1 IMPLANT
GAUZE SPONGE 4X4 12PLY STRL (GAUZE/BANDAGES/DRESSINGS) ×1 IMPLANT
GAUZE XEROFORM 1X8 LF (GAUZE/BANDAGES/DRESSINGS) ×1 IMPLANT
GLOVE BIO SURGEON STRL SZ7.5 (GLOVE) ×1 IMPLANT
GLOVE SURG UNDER POLY LF SZ7.5 (GLOVE) ×1 IMPLANT
GOWN STRL REUS W/ TWL XL LVL3 (GOWN DISPOSABLE) ×1 IMPLANT
GOWN STRL REUS W/TWL XL LVL3 (GOWN DISPOSABLE) ×1
GOWN STRL REUS W/TWL XL LVL4 (GOWN DISPOSABLE) ×1 IMPLANT
GUIDE PIN 3.2X343 (PIN) ×2
GUIDE PIN 3.2X343MM (PIN) ×2
HEMOVAC 400CC 10FR (MISCELLANEOUS) IMPLANT
HOLDER FOLEY CATH W/STRAP (MISCELLANEOUS) ×1 IMPLANT
KIT PATIENT CARE HANA TABLE (KITS) IMPLANT
KIT TURNOVER CYSTO (KITS) ×1 IMPLANT
MANIFOLD NEPTUNE II (INSTRUMENTS) ×1 IMPLANT
MAT ABSORB  FLUID 56X50 GRAY (MISCELLANEOUS) ×1
MAT ABSORB FLUID 56X50 GRAY (MISCELLANEOUS) ×1 IMPLANT
NAIL INTERTAN 11.5X18 125D (Nail) IMPLANT
NEEDLE HYPO 22GX1.5 SAFETY (NEEDLE) IMPLANT
NS IRRIG 1000ML POUR BTL (IV SOLUTION) ×1 IMPLANT
NS IRRIG 500ML POUR BTL (IV SOLUTION) IMPLANT
PACK HIP COMPR (MISCELLANEOUS) ×1 IMPLANT
PAD ABD DERMACEA PRESS 5X9 (GAUZE/BANDAGES/DRESSINGS) ×1 IMPLANT
PAD ARMBOARD 7.5X6 YLW CONV (MISCELLANEOUS) ×1 IMPLANT
PIN GUIDE 3.2X343MM (PIN) IMPLANT
SCREW LAG COMPR KIT 95/90 (Screw) IMPLANT
SCREW TRIGEN LOW PROF 5.0X40 (Screw) IMPLANT
STAPLER SKIN PROX 35W (STAPLE) ×1 IMPLANT
SUCTION FRAZIER HANDLE 10FR (MISCELLANEOUS) ×1
SUCTION TUBE FRAZIER 10FR DISP (MISCELLANEOUS) ×1 IMPLANT
SUT VIC AB 0 CT1 36 (SUTURE) ×2 IMPLANT
SUT VIC AB 2-0 CT1 (SUTURE) ×1 IMPLANT
SUT VIC AB 2-0 CT1 27 (SUTURE) ×1
SUT VIC AB 2-0 CT1 TAPERPNT 27 (SUTURE) ×1 IMPLANT
SUT VICRYL 0 AB UR-6 (SUTURE) ×1 IMPLANT
SYR 10ML LL (SYRINGE) IMPLANT
SYR 30ML LL (SYRINGE) ×1 IMPLANT
TAPE PAPER 1/2X10 TAN MEDIPORE (MISCELLANEOUS) ×1 IMPLANT
TRAP FLUID SMOKE EVACUATOR (MISCELLANEOUS) ×1 IMPLANT
TRAY FOLEY SLVR 16FR LF STAT (SET/KITS/TRAYS/PACK) ×1 IMPLANT
WATER STERILE IRR 500ML POUR (IV SOLUTION) ×1 IMPLANT

## 2022-04-29 NOTE — Progress Notes (Signed)
Hd tx of 3.5hrs completed. 84L total vol processed. No complications. Report given to elana gaboriault, rn.  Total uf removed: 2576ml Post hd v/s: 97.6 188/80(112) 80 13 98%

## 2022-04-29 NOTE — ED Notes (Signed)
Gave report to Steve Vance in pre op. Will call when pt. Is done with dialysis.

## 2022-04-29 NOTE — Progress Notes (Signed)
Patient states that he ate a meal today but can't remember time or what he ate.  Dr. Wynetta Emery at bedside and aware. Patient returns to sleep when not stimulated.  Spoke with ER nurse and dialysis nurse caring for patient today and both confirmed patient had been NPO except for meds.
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
## 2022-04-29 NOTE — Discharge Instructions (Addendum)
Orthopedic discharge instructions: Patient has had a left hip intramedullary nail Patient should remain weightbearing on the operative extremity until follow-up Dressing changes as needed DVT prophylaxis includes aspirin $RemoveBeforeDE'325mg'[id number]$  BID Follow up with Dr. Minda Meo PA, Murilo Zanette PA-C at [employer], [place] office in 10-14 days for wound check, staple removal and x-ray.   Resume your HD on your out pt schedule

## 2022-04-29 NOTE — Assessment & Plan Note (Signed)
-   We will continue H2 blocker therapy.

## 2022-04-29 NOTE — Progress Notes (Signed)
Patient arrived to pacu, sleepy. Will open his eyes to voice, appears comfortable, IV tylenol in pacu for comfort. Will continue to monitor closely.

## 2022-04-29 NOTE — ED Notes (Signed)
Pt resting in stretcher with eyes closed at this time.  Stretcher in low position and call light within reach.  Pt arousable to voice.  NAD noted.

## 2022-04-29 NOTE — ED Notes (Signed)
This RN to bedside for pt. Yelling. Pt. States he is going to be sick. This RN and [place], RN, and raquel, RN repositioned pt, sitting him up to avoid aspiration, as pt. Began vomiting. Pt. Medicated will continue to monitor.

## 2022-04-29 NOTE — Progress Notes (Signed)
[facility]  ROUNDING NOTE   Subjective:   Steve Vance is a 61 year old male with past medical conditions including type 2 diabetes, hypertension, dyslipidemia, systolic heart failure with EF 45 to 50% and end-stage renal disease on hemodialysis.  Patient presents to the emergency department after suffering a fall with left hip pain.  Patient has been admitted for ESRD (end stage renal disease) on dialysis ([place]) [N18.6, Z99.2] Closed left hip fracture ([place]) [S72.002A] Closed displaced intertrochanteric fracture of left femur, initial encounter [facility]) [S72.142A]  Patient is known to our practice and receives outpatient dialysis treatments at [facility] on a TTS schedule.  Last treatment received on Tuesday.  Patient states his feet gave way while walking causing his fall.  Denies no previous or current illness.  Denies dizziness.  Denies shortness of breath or loss of consciousness.  Denies chest pain or abdominal discomfort.  Labs on ED arrival include sodium 135, potassium 5.8, serum bicarb 21, glucose 132, BUN 70, creatinine 12.4 with GFR 4, albumin 3.3, and hemoglobin 11.5.  She x-ray shows mild chronic pulmonary edema.  Left hip x-ray shows displaced intratrochanteric fracture of the left hip.  Orthopedics has been consulted  We have been consulted to provide dialysis during this admission.  Objective:  Vital signs in last 24 hours:  Temp:  [97.8 F (36.6 C)-97.9 F (36.6 C)] 97.9 F (36.6 C) (08/24 1010) Pulse Rate:  [75-92] 81 (08/24 1330) Resp:  [10-24] 12 (08/24 1330) BP: (165-199)/(91-112) 175/91 (08/24 1330) SpO2:  [95 %-100 %] 97 % (08/24 1330) Weight:  [80 kg] 80 kg (08/24 0148)  Weight change:  Filed Weights   04/29/22 0148  Weight: 80 kg    Intake/Output: No intake/output data recorded.   Intake/Output this shift:  No intake/output data recorded.  Physical Exam: General: Anxious, grimacing  Head: Normocephalic, atraumatic. Moist oral  mucosal membranes  Eyes: Anicteric  Lungs:  Mild basilar crackles, normal effort, room air  Heart: Regular rate and rhythm  Abdomen:  Soft, nontender, obese  Extremities: None peripheral edema.  Neurologic: Nonfocal, moving all four extremities  Skin: No lesions  Access: Left aVF    Basic Metabolic Panel: Recent Labs  Lab 04/29/22 0148 04/29/22 0456  NA 135 136  K 5.8* 5.3*  CL 96* 102  CO2 21* 20*  GLUCOSE 132* 118*  BUN 70* 64*  CREATININE 12.40* 11.04*  CALCIUM 8.0* 7.3*    Liver Function Tests: Recent Labs  Lab 04/29/22 0148  AST 27  ALT 16  ALKPHOS 80  BILITOT 1.0  PROT 8.3*  ALBUMIN 3.3*   No results for input(s): "LIPASE", "AMYLASE" in the last 168 hours. No results for input(s): "AMMONIA" in the last 168 hours.  CBC: Recent Labs  Lab 04/29/22 0148 04/29/22 0456  WBC 8.0 7.3  HGB 11.5* 12.4*  HCT 36.3* 39.3  MCV 88.8 89.3  PLT 238 211    Cardiac Enzymes: No results for input(s): "CKTOTAL", "CKMB", "CKMBINDEX", "TROPONINI" in the last 168 hours.  BNP: Invalid input(s): "POCBNP"  CBG: No results for input(s): "GLUCAP" in the last 168 hours.  Microbiology: Results for orders placed or performed during the hospital encounter of 03/04/22  Blood Culture (routine x 2)     Status: Abnormal   Collection Time: 03/04/22  1:52 PM   Specimen: BLOOD  Result Value Ref Range Status   Specimen Description   Final    BLOOD RAC Performed at [facility], [address] [place], [place] [address]  Special Requests   Final    BOTTLES DRAWN AEROBIC AND ANAEROBIC BCAV Performed at [facility], [address] [place], [place] [address]    Culture  Setup Time   Final    Organism ID to follow GRAM POSITIVE COCCI IN BOTH AEROBIC AND ANAEROBIC BOTTLES CRITICAL RESULT CALLED TO, READ BACK BY AND VERIFIED WITH: JASON BELEUE $RemoveBefo'[email or web address]$  ON 03/05/22 SKL GRAM STAIN REVIEWED-AGREE WITH RESULT    Culture (A)  Final    GROUP B  STREP(S.AGALACTIAE)ISOLATED STAPHYLOCOCCUS AUREUS CRITICAL RESULT CALLED TO, READ BACK BY AND VERIFIED WITH: B. BIERS PHARMD, AT [id number] 03/07/22 D. VANHOOK Performed at [facility], [place] [address] [place], [place] [address]    Report Status 03/08/2022 FINAL  Final   Organism ID, Bacteria GROUP B STREP(S.AGALACTIAE)ISOLATED  Final   Organism ID, Bacteria STAPHYLOCOCCUS AUREUS  Final      Susceptibility   Group b strep(s.agalactiae)isolated - MIC*    CLINDAMYCIN <=0.25 SENSITIVE Sensitive     AMPICILLIN <=0.25 SENSITIVE Sensitive     ERYTHROMYCIN <=0.12 SENSITIVE Sensitive     VANCOMYCIN 0.5 SENSITIVE Sensitive     CEFTRIAXONE <=0.12 SENSITIVE Sensitive     LEVOFLOXACIN 1 SENSITIVE Sensitive     PENICILLIN Value in next row Sensitive      SENSITIVE<=0.06    * GROUP B STREP(S.AGALACTIAE)ISOLATED   Staphylococcus aureus - MIC*    CIPROFLOXACIN Value in next row Sensitive      SENSITIVE<=0.06    ERYTHROMYCIN Value in next row Sensitive      SENSITIVE<=0.06    GENTAMICIN Value in next row Sensitive      SENSITIVE<=0.06    OXACILLIN Value in next row Sensitive      SENSITIVE<=0.06    TETRACYCLINE Value in next row Sensitive      SENSITIVE<=0.06    VANCOMYCIN Value in next row Sensitive      SENSITIVE<=0.06    TRIMETH/SULFA Value in next row Sensitive      SENSITIVE<=0.06    CLINDAMYCIN Value in next row Sensitive      SENSITIVE<=0.06    RIFAMPIN Value in next row Sensitive      SENSITIVE<=0.06    Inducible Clindamycin Value in next row Sensitive      SENSITIVE<=0.06    * STAPHYLOCOCCUS AUREUS  Blood Culture ID Panel (Reflexed)     Status: Abnormal   Collection Time: 03/04/22  1:52 PM  Result Value Ref Range Status   Enterococcus faecalis NOT DETECTED NOT DETECTED Final   Enterococcus Faecium NOT DETECTED NOT DETECTED Final   Listeria monocytogenes NOT DETECTED NOT DETECTED Final   Staphylococcus species NOT DETECTED NOT DETECTED Final   Staphylococcus aureus (BCID) NOT  DETECTED NOT DETECTED Final   Staphylococcus epidermidis NOT DETECTED NOT DETECTED Final   Staphylococcus lugdunensis NOT DETECTED NOT DETECTED Final   Streptococcus species DETECTED (A) NOT DETECTED Final    Comment: CRITICAL RESULT CALLED TO, READ BACK BY AND VERIFIED WITH: JASON BELEUE $RemoveBefo'[email or web address]$  ON 03/05/22 SKL    Streptococcus agalactiae DETECTED (A) NOT DETECTED Final    Comment: CRITICAL RESULT CALLED TO, READ BACK BY AND VERIFIED WITH: JASON BELEUE $RemoveBefo'[email or web address]$  ON 03/05/22 SKL    Streptococcus pneumoniae NOT DETECTED NOT DETECTED Final   Streptococcus pyogenes NOT DETECTED NOT DETECTED Final   A.calcoaceticus-baumannii NOT DETECTED NOT DETECTED Final   Bacteroides fragilis NOT DETECTED NOT DETECTED Final   Enterobacterales NOT DETECTED NOT DETECTED Final   Enterobacter cloacae complex NOT DETECTED NOT DETECTED Final  Escherichia coli NOT DETECTED NOT DETECTED Final   Klebsiella aerogenes NOT DETECTED NOT DETECTED Final   Klebsiella oxytoca NOT DETECTED NOT DETECTED Final   Klebsiella pneumoniae NOT DETECTED NOT DETECTED Final   Proteus species NOT DETECTED NOT DETECTED Final   Salmonella species NOT DETECTED NOT DETECTED Final   Serratia marcescens NOT DETECTED NOT DETECTED Final   Haemophilus influenzae NOT DETECTED NOT DETECTED Final   Neisseria meningitidis NOT DETECTED NOT DETECTED Final   Pseudomonas aeruginosa NOT DETECTED NOT DETECTED Final   Stenotrophomonas maltophilia NOT DETECTED NOT DETECTED Final   Candida albicans NOT DETECTED NOT DETECTED Final   Candida auris NOT DETECTED NOT DETECTED Final   Candida glabrata NOT DETECTED NOT DETECTED Final   Candida krusei NOT DETECTED NOT DETECTED Final   Candida parapsilosis NOT DETECTED NOT DETECTED Final   Candida tropicalis NOT DETECTED NOT DETECTED Final   Cryptococcus neoformans/gattii NOT DETECTED NOT DETECTED Final    Comment: Performed at [facility], [address] [place], [place] [address]  Blood Culture  (routine x 2)     Status: Abnormal   Collection Time: 03/04/22  1:57 PM   Specimen: BLOOD  Result Value Ref Range Status   Specimen Description BLOOD BLOOD RIGHT FOREARM  Final   Special Requests   Final    BOTTLES DRAWN AEROBIC AND ANAEROBIC Blood Culture results may not be optimal due to an inadequate volume of blood received in culture bottles   Culture  Setup Time   Final    GRAM POSITIVE COCCI IN BOTH AEROBIC AND ANAEROBIC BOTTLES CRITICAL VALUE NOTED.  VALUE IS CONSISTENT WITH PREVIOUSLY REPORTED AND CALLED VALUE.    Culture (A)  Final    GROUP B STREP(S.AGALACTIAE)ISOLATED STAPHYLOCOCCUS AUREUS CRITICAL RESULT CALLED TO, READ BACK BY AND VERIFIED WITH: B. BIERS PHARMD, AT 1030 03/07/22 BY D. VANHOOK SUSCEPTIBILITIES PERFORMED ON PREVIOUS CULTURE WITHIN THE LAST 5 DAYS. Performed at [facility], [place] [address], [place], [place] [address]    Report Status 03/08/2022 FINAL  Final  Resp Panel by RT-PCR (Flu A&B, Covid) Anterior Nasal Swab     Status: None   Collection Time: 03/04/22  2:12 PM   Specimen: Anterior Nasal Swab  Result Value Ref Range Status   SARS Coronavirus 2 by RT PCR NEGATIVE NEGATIVE Final    Comment: (NOTE) SARS-CoV-2 target nucleic acids are NOT DETECTED.  The SARS-CoV-2 RNA is generally detectable in upper respiratory specimens during the acute phase of infection. The lowest concentration of SARS-CoV-2 viral copies this assay can detect is 138 copies/mL. A negative result does not preclude SARS-Cov-2 infection and should not be used as the sole basis for treatment or other patient management decisions. A negative result may occur with  improper specimen collection/handling, submission of specimen other than nasopharyngeal swab, presence of viral mutation(s) within the areas targeted by this assay, and inadequate number of viral copies(<138 copies/mL). A negative result must be combined with clinical observations, patient history, and  epidemiological information. The expected result is Negative.  Fact Sheet for Patients:  EntrepreneurPulse.com.au  Fact Sheet for Healthcare Providers:  IncredibleEmployment.be  This test is no t yet approved or cleared by the [place] FDA and  has been authorized for detection and/or diagnosis of SARS-CoV-2 by FDA under an Emergency Use Authorization (EUA). This EUA will remain  in effect (meaning this test can be used) for the duration of the COVID-19 declaration under Section 564(b)(1) of the Act, 21 U.S.C.section 360bbb-3(b)(1), unless the  authorization is terminated  or revoked sooner.       Influenza A by PCR NEGATIVE NEGATIVE Final   Influenza B by PCR NEGATIVE NEGATIVE Final    Comment: (NOTE) The Xpert Xpress SARS-CoV-2/FLU/RSV plus assay is intended as an aid in the diagnosis of influenza from Nasopharyngeal swab specimens and should not be used as a sole basis for treatment. Nasal washings and aspirates are unacceptable for Xpert Xpress SARS-CoV-2/FLU/RSV testing.  Fact Sheet for Patients: EntrepreneurPulse.com.au  Fact Sheet for Healthcare Providers: IncredibleEmployment.be  This test is not yet approved or cleared by the [place] FDA and has been authorized for detection and/or diagnosis of SARS-CoV-2 by FDA under an Emergency Use Authorization (EUA). This EUA will remain in effect (meaning this test can be used) for the duration of the COVID-19 declaration under Section 564(b)(1) of the Act, 21 U.S.C. section 360bbb-3(b)(1), unless the authorization is terminated or revoked.  Performed at [facility], [place], [place] [address]   C Difficile Quick Screen w PCR reflex     Status: None   Collection Time: 03/04/22  9:21 PM   Specimen: Stool  Result Value Ref Range Status   C Diff antigen NEGATIVE NEGATIVE Final   C Diff toxin NEGATIVE NEGATIVE Final    C Diff interpretation No C. difficile detected.  Final    Comment: Performed at [facility], [address] [place], [place] [address]  Gastrointestinal Panel by PCR , Stool     Status: None   Collection Time: 03/04/22  9:21 PM   Specimen: Stool  Result Value Ref Range Status   Campylobacter species NOT DETECTED NOT DETECTED Final   Plesimonas shigelloides NOT DETECTED NOT DETECTED Final   Salmonella species NOT DETECTED NOT DETECTED Final   Yersinia enterocolitica NOT DETECTED NOT DETECTED Final   Vibrio species NOT DETECTED NOT DETECTED Final   Vibrio cholerae NOT DETECTED NOT DETECTED Final   Enteroaggregative E coli (EAEC) NOT DETECTED NOT DETECTED Final   Enteropathogenic E coli (EPEC) NOT DETECTED NOT DETECTED Final   Enterotoxigenic E coli (ETEC) NOT DETECTED NOT DETECTED Final   Shiga like toxin producing E coli (STEC) NOT DETECTED NOT DETECTED Final   Shigella/Enteroinvasive E coli (EIEC) NOT DETECTED NOT DETECTED Final   Cryptosporidium NOT DETECTED NOT DETECTED Final   Cyclospora cayetanensis NOT DETECTED NOT DETECTED Final   Entamoeba histolytica NOT DETECTED NOT DETECTED Final   Giardia lamblia NOT DETECTED NOT DETECTED Final   Adenovirus F40/41 NOT DETECTED NOT DETECTED Final   Astrovirus NOT DETECTED NOT DETECTED Final   Norovirus GI/GII NOT DETECTED NOT DETECTED Final   Rotavirus A NOT DETECTED NOT DETECTED Final   Sapovirus (I, II, IV, and V) NOT DETECTED NOT DETECTED Final    Comment: Performed at [facility] [address], [place], [place] [address]  Urine Culture     Status: Abnormal   Collection Time: 03/04/22  9:39 PM   Specimen: In/Out Cath Urine  Result Value Ref Range Status   Specimen Description   Final    IN/OUT CATH URINE Performed at [facility], [address], [place], [place] [address]    Special Requests   Final    NONE Performed at [facility], [address] [place], [place] [address]     Culture MULTIPLE SPECIES PRESENT, SUGGEST RECOLLECTION (A)  Final   Report Status 03/07/2022 FINAL  Final  Culture, blood (Routine X 2) w Reflex to ID Panel  Status: None   Collection Time: 03/06/22  4:50 AM   Specimen: BLOOD  Result Value Ref Range Status   Specimen Description BLOOD RIGHT ANTECUBITAL  Final   Special Requests   Final    BOTTLES DRAWN AEROBIC AND ANAEROBIC Blood Culture adequate volume   Culture   Final    NO GROWTH 5 DAYS Performed at [facility], [address], [place], [place] [address]    Report Status 03/11/2022 FINAL  Final  Culture, blood (Routine X 2) w Reflex to ID Panel     Status: None   Collection Time: 03/06/22  4:50 AM   Specimen: BLOOD  Result Value Ref Range Status   Specimen Description BLOOD BLOOD RIGHT WRIST  Final   Special Requests   Final    BOTTLES DRAWN AEROBIC AND ANAEROBIC Blood Culture adequate volume   Culture   Final    NO GROWTH 5 DAYS Performed at [facility], [address] [place], [place] [address]    Report Status 03/11/2022 FINAL  Final    Coagulation Studies: Recent Labs    04/29/22 0148  LABPROT 14.5  INR 1.1    Urinalysis: No results for input(s): "COLORURINE", "LABSPEC", "PHURINE", "GLUCOSEU", "HGBUR", "BILIRUBINUR", "KETONESUR", "PROTEINUR", "UROBILINOGEN", "NITRITE", "LEUKOCYTESUR" in the last 72 hours.  Invalid input(s): "APPERANCEUR"    Imaging: DG Chest 1 View  Result Date: 04/29/2022 CLINICAL DATA:  Fall, left hip fracture EXAM: CHEST  1 VIEW COMPARISON:  03/04/2022 FINDINGS: Lung volumes are small, but are symmetric and are stable since prior examination. Perihilar and lower lung zone interstitial infiltrates are present, similar to prior examination, suggestive of mild chronic cardiogenic pulmonary edema. Mild cardiomegaly is stable. No pneumothorax or pleural effusion. No acute bone abnormality. IMPRESSION: Mild chronic cardiogenic pulmonary edema.  Stable cardiomegaly.  Electronically Signed   By: Fidela Salisbury M.D.   On: 04/29/2022 02:59   DG HIP UNILAT WITH PELVIS 2-3 VIEWS LEFT  Result Date: 04/29/2022 CLINICAL DATA:  Fall, left hip pain EXAM: DG HIP (WITH OR WITHOUT PELVIS) 2-3V LEFT COMPARISON:  None Available. FINDINGS: There is an acute, intratrochanteric fracture of the left hip with 1/2 shaft with lateral displacement and mild override of the distal fracture fragment. Femoral head is still seated within the left acetabulum. Left hip joint space is preserved. Limited evaluation of the right hip is unremarkable. Pelvis is intact. Vascular calcifications are noted. IMPRESSION: Acute, displaced intratrochanteric fracture of the left hip. Electronically Signed   By: Fidela Salisbury M.D.   On: 04/29/2022 02:57     Medications:    anticoagulant sodium citrate      ceFAZolin (ANCEF) IV      amLODipine  10 mg Oral Daily   atorvastatin  10 mg Oral Daily   calcium acetate  667 mg Oral TID WC   carvedilol  6.25 mg Oral BID   Chlorhexidine Gluconate Cloth  6 each Topical Q0600   famotidine  20 mg Oral Daily   isosorbide mononitrate  30 mg Oral Daily   acetaminophen, albuterol, alteplase, anticoagulant sodium citrate, heparin, hydrALAZINE, HYDROcodone-acetaminophen, lidocaine (PF), lidocaine-prilocaine, magnesium hydroxide, morphine injection, ondansetron (ZOFRAN) IV, ondansetron, pentafluoroprop-tetrafluoroeth, traZODone  Assessment/ Plan:  [name] is a 61 y.o.  male past medical conditions including type 2 diabetes, hypertension, dyslipidemia, systolic heart failure with EF 45 to 50% and end-stage renal disease on hemodialysis.  Patient presents to the emergency department after suffering a fall with left hip pain.  Patient has been admitted for  ESRD (end stage renal disease) on dialysis ([place]) [N18.6, Z99.2] Closed left hip fracture ([place]) [S72.002A] Closed displaced intertrochanteric fracture of left femur, initial encounter ([place]) [S72.142A]  CC KA  [facility] /TTS/left aVF  End-stage renal disease with hyperkalemia on hemodialysis.  Will maintain outpatient schedule if possible.  Potassium on admission 5.8.  Will perform scheduled dialysis today on 2K bath to manage potassium.  Next treatment scheduled for Saturday.  UF goal 2 L as tolerated.  2. Anemia of chronic kidney disease Lab Results  Component Value Date   HGB 12.4 (L) 04/29/2022    Patient receives [facility] outpatient.  3. Diabetes mellitus type II with chronic kidney disease/renal manifestations: noninsulin dependent.  Most recent hemoglobin A1c is 5.6 on 04/29/22.   4.  Hypertension with chronic kidney disease.  Home regimen includes amlodipine, carvedilol, isosorbide, and lisinopril.Lisinopril currently held during admission.  5. Secondary Hyperparathyroidism: with outpatient labs: PTH 759, phosphorus 11.5, calcium 8.6 on 04/22/22.   Lab Results  Component Value Date   PTH 301 (H) 09/24/2020   CALCIUM 7.3 (L) 04/29/2022   CAION 1.21 06/27/2019   PHOS 6.2 (H) 08/14/2021  Calcium acetate ordered with meals outpatient.  Calcium below target.  Continue calcium acetate.  We will obtain updated phosphorus labs in AM.     LOS: 0   8/24/20231:42 PM
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
## 2022-04-29 NOTE — ED Provider Notes (Signed)
[facility] Provider Note    Event Date/Time   First MD Initiated Contact with Patient 04/29/22 0158     (approximate)   History   No chief complaint on file.  Level 5 caveat:  history/ROS limited by acute/critical illness   HPI  Steve Vance is a 61 y.o. male who is a poor historian but reports that his history includes end-stage renal disease on hemodialysis Tuesdays, Thursdays, and Saturdays.  He presents by EMS for evaluation of acute and severe pain in his left hip after a fall.  He says that he just tripped when he was walking up some steps and he landed on his left hip.  The pain was immediate and is much worse if he moves around.  He says he always has some numbness and tingling in his leg and it does not feel different.  He was scheduled for dialysis this morning.  He denies hitting his head and says he has no headache, neck pain, chest pain, shortness of breath, nor abdominal pain.     Physical Exam   Triage Vital Signs: ED Triage Vitals  Enc Vitals Group     BP 04/29/22 0151 (!) 182/95     Pulse Rate 04/29/22 0151 77     Resp 04/29/22 0151 (!) 24     Temp 04/29/22 0151 97.8 F (36.6 C)     Temp Source 04/29/22 0151 Oral     SpO2 04/29/22 0151 97 %     Weight 04/29/22 0148 80 kg (176 lb 5.9 oz)     Height 04/29/22 0148 1.702 m ($Remove'5\' 7"'[id number]$ )     Head Circumference --      Peak Flow --      Pain Score 04/29/22 0142 10     Pain Loc --      Pain Edu? --      Excl. in [place]? --     Most recent vital signs: Vitals:   04/29/22 0700 04/29/22 0730  BP:  (!) 185/104  Pulse: 88 89  Resp: 20 19  Temp:    SpO2:  97%     General: Awake, severe distress, yelling out in pain frequently.  Appears chronically ill in addition to his acute injury. CV:  Good peripheral perfusion.  Normal heart sounds. Resp:  Normal effort.  Lungs are clear to auscultation bilaterally. Abd:  No distention.  No tenderness to palpation. Other:  Left lower extremity is  externally rotated and shortened.  He is able to move his toes and feet but he yells out intermittently and with any attempt at passive or active range of motion.   ED Results / Procedures / Treatments   Labs (all labs ordered are listed, but only abnormal results are displayed) Labs Reviewed  CBC - Abnormal; Notable for the following components:      Result Value   RBC 4.09 (*)    Hemoglobin 11.5 (*)    HCT 36.3 (*)    All other components within normal limits  COMPREHENSIVE METABOLIC PANEL - Abnormal; Notable for the following components:   Potassium 5.8 (*)    Chloride 96 (*)    CO2 21 (*)    Glucose, Bld 132 (*)    BUN 70 (*)    Creatinine, Ser 12.40 (*)    Calcium 8.0 (*)    Total Protein 8.3 (*)    Albumin 3.3 (*)    GFR, Estimated 4 (*)    Anion gap 18 (*)  All other components within normal limits  CBC - Abnormal; Notable for the following components:   Hemoglobin 12.4 (*)    All other components within normal limits  BASIC METABOLIC PANEL - Abnormal; Notable for the following components:   Potassium 5.3 (*)    CO2 20 (*)    Glucose, Bld 118 (*)    BUN 64 (*)    Creatinine, Ser 11.04 (*)    Calcium 7.3 (*)    GFR, Estimated 5 (*)    All other components within normal limits  TROPONIN I (HIGH SENSITIVITY) - Abnormal; Notable for the following components:   Troponin I (High Sensitivity) 38 (*)    All other components within normal limits  TROPONIN I (HIGH SENSITIVITY) - Abnormal; Notable for the following components:   Troponin I (High Sensitivity) 34 (*)    All other components within normal limits  PROTIME-INR  HEPATITIS B SURFACE ANTIGEN  HEPATITIS B SURFACE ANTIBODY,QUALITATIVE  HEPATITIS B SURFACE ANTIBODY, QUANTITATIVE  HEPATITIS B CORE ANTIBODY, TOTAL  HEPATITIS C ANTIBODY  TYPE AND SCREEN  TYPE AND SCREEN     EKG  ED ECG REPORT I, Hinda Kehr, the attending physician, personally viewed and interpreted this ECG.  Date: 04/29/2022 EKG Time:  1:54 AM Rate: 75 Rhythm: normal sinus rhythm QRS Axis: normal Intervals: Slightly prolonged QTc at 494 ms, ST/T Wave abnormalities: Non-specific ST segment / T-wave changes, but no clear evidence of acute ischemia. Narrative Interpretation: no definitive evidence of acute ischemia; does not meet STEMI criteria.    RADIOLOGY I viewed and interpreted the patient's left hip and pelvis x-rays and they are notable for obvious fracture of the proximal femur.  Radiologist confirmed the intertrochanteric femur fracture.  I also viewed and interpreted the patient's 1 view chest x-ray and there appeared to be some chronic fluid/pulmonary edema.  No significant change from before no evidence of pneumonia.  Radiology report agrees.    PROCEDURES:  Critical Care performed: No  Procedures   MEDICATIONS ORDERED IN ED: Medications  amLODipine (NORVASC) tablet 10 mg (has no administration in time range)  atorvastatin (LIPITOR) tablet 10 mg (has no administration in time range)  carvedilol (COREG) tablet 6.25 mg (has no administration in time range)  isosorbide mononitrate (IMDUR) 24 hr tablet 30 mg (has no administration in time range)  calcium acetate (PHOSLO) capsule 667 mg (has no administration in time range)  famotidine (PEPCID) tablet 20 mg (has no administration in time range)  ondansetron (ZOFRAN) tablet 4 mg (has no administration in time range)  HYDROcodone-acetaminophen (NORCO/VICODIN) 5-325 MG per tablet 1-2 tablet (2 tablets Oral Given 04/29/22 0544)  morphine (PF) 2 MG/ML injection 0.5 mg (has no administration in time range)  magnesium hydroxide (MILK OF MAGNESIA) suspension 30 mL (has no administration in time range)  ondansetron (ZOFRAN) injection 4 mg (4 mg Intravenous Given 04/29/22 0713)  acetaminophen (TYLENOL) tablet 650 mg (has no administration in time range)  traZODone (DESYREL) tablet 25 mg (has no administration in time range)  albuterol (PROVENTIL) (2.5 MG/3ML) 0.083%  nebulizer solution 2.5 mg (has no administration in time range)  hydrALAZINE (APRESOLINE) injection 10 mg (has no administration in time range)  Chlorhexidine Gluconate Cloth 2 % PADS 6 each (has no administration in time range)  pentafluoroprop-tetrafluoroeth (GEBAUERS) aerosol 1 Application (has no administration in time range)  lidocaine (PF) (XYLOCAINE) 1 % injection 5 mL (has no administration in time range)  lidocaine-prilocaine (EMLA) cream 1 Application (has no administration in time range)  heparin  injection 1,000 Units (has no administration in time range)  anticoagulant sodium citrate solution 5 mL (has no administration in time range)  alteplase (CATHFLO ACTIVASE) injection 2 mg (has no administration in time range)  HYDROmorphone (DILAUDID) injection 1 mg (1 mg Intravenous Given 04/29/22 0201)  ondansetron (ZOFRAN) injection 4 mg (4 mg Intravenous Given 04/29/22 0201)     IMPRESSION / MDM / ASSESSMENT AND PLAN / ED COURSE  I reviewed the triage vital signs and the nursing notes.                              Differential diagnosis includes, but is not limited to, hip fracture, dislocation, electrolyte or metabolic abnormality.  Patient's presentation is most consistent with acute complicated illness / injury requiring diagnostic workup.  Although the patient has severe chronic medical conditions including end-stage renal disease on hemodialysis, his acute issue tonight seems to be a hip fracture.  Labs/studies ordered: Hip/pelvis x-ray, chest x-ray, EKG, comprehensive metabolic panel, CBC, pro time INR, type and screen, high-sensitivity troponin.  As documented above, patient is positive for intertrochanteric left hip fracture.  No significant changes on chest x-ray.  No significant ischemic changes on EKG.  Labs are essentially normal for him including a creatinine of 11.  He has hyperkalemia at 5.8 but no concerning EKG changes and he is due for dialysis today.  I will first  consult orthopedic surgery but then anticipate consulting the hospitalist service as well as nephrology.  The initially received fentanyl 50 mcg IV but it did not help very much with his pain.  He continues to scream.  I ordered Dilaudid 1 mg IV; this will last longer for him until he is dialyzed, but it should help with his ongoing pain.  The patient is on the cardiac monitor to evaluate for evidence of arrhythmia and/or significant heart rate changes.   Clinical Course as of 04/29/22 4268  Thu Apr 29, 2022  0343 I consulted by phone with Dr. Sharlet Salina with the hospitalist service.  He agreed with the plan for admission and requested that the patient remain n.p.o. for probable surgery later today if he can be dialyzed and medically stabilized.  I am consulting the hospitalist for admission.  I am also sending a secure chat message to Dr. Candiss Norse who is on-call for nephrology so that he will find the message as soon as he gets up in the morning to know to plan for dialysis. [CF]  3419 Consulted Dr. Sidney Ace with the hospitalist service.  We discussed the case and he will admit the patient. [CF]    Clinical Course User Index [CF] Hinda Kehr, MD     FINAL CLINICAL IMPRESSION(S) / ED DIAGNOSES   Final diagnoses:  Closed displaced intertrochanteric fracture of left femur, initial encounter ([place])  ESRD (end stage renal disease) on dialysis [facility])     Rx / DC Orders   ED Discharge Orders     None        Note:  This document was prepared using Dragon voice recognition software and may include unintentional dictation errors.   Hinda Kehr, MD 04/29/22 [phone]

## 2022-04-29 NOTE — Assessment & Plan Note (Signed)
-   This is manifested by interstitial pulmonary edema on chest x-ray, associated with his ESRD. - The patient will have hemodialysis this morning. - We will offer IV Lasix to assist with pulmonary congestion.

## 2022-04-29 NOTE — ED Triage Notes (Addendum)
Pt presents to ER via ems from home after ems states pt called out after tripping over his feet and falling down appx 4 steps.  Pt c/o left hip pain at this time.  Some shortening and rotation noted to left lower leg.  Pt screaming in pain on any movement.  Distal pulses noted to LLE.  Pt is on dialysis and goes Tuesday, Thursday and Saturday with no missed tx.  Pt received total of 50 mcg fentanyl w/ems pta.  Pt is A&O x4 at this time.
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
## 2022-04-29 NOTE — ED Notes (Signed)
Pt placed on 4L O2 at this time d/t desaturations after receiving pain meds.

## 2022-04-29 NOTE — Anesthesia Procedure Notes (Signed)
Procedure Name: Intubation Date/Time: 04/29/2022 5:43 PM  Performed by: Lily Peer, Maryrose Colvin, CRNAPre-anesthesia Checklist: Patient identified, Emergency Drugs available, Suction available and Patient being monitored Patient Re-evaluated:Patient Re-evaluated prior to induction Oxygen Delivery Method: Circle system utilized Preoxygenation: Pre-oxygenation with 100% oxygen Induction Type: IV induction Ventilation: Mask ventilation without difficulty Laryngoscope Size: McGraph and 4 Grade View: Grade I Tube type: Oral Tube size: 7.5 mm Number of attempts: 1 Airway Equipment and Method: Stylet and Oral airway Placement Confirmation: ETT inserted through vocal cords under direct vision, positive ETCO2 and breath sounds checked- equal and bilateral Secured at: 23 cm Tube secured with: Tape Dental Injury: Teeth and Oropharynx as per pre-operative assessment

## 2022-04-29 NOTE — Transfer of Care (Signed)
Immediate Anesthesia Transfer of Care Note  Patient: Dream Nodal  Procedure(s) Performed: INTRAMEDULLARY (IM) NAIL INTERTROCHANTERIC (Left: Hip)  Patient Location: PACU  Anesthesia Type:General  Level of Consciousness: drowsy  Airway & Oxygen Therapy: Patient Spontanous Breathing and Patient connected to nasal cannula oxygen  Post-op Assessment: Report given to RN  Post vital signs: stable  Last Vitals:  Vitals Value Taken Time  BP 154/79 04/29/22 1906  Temp    Pulse 82 04/29/22 1907  Resp 8 04/29/22 1907  SpO2 98 % 04/29/22 1907  Vitals shown include unvalidated device data.  Last Pain:  Vitals:   04/29/22 1400  TempSrc: Oral  PainSc:          Complications: No notable events documented.

## 2022-04-29 NOTE — Op Note (Signed)
DATE OF SURGERY:  04/29/2022  TIME: 7:02 PM  PATIENT NAME:  Steve Vance  AGE: 61 y.o.  PRE-OPERATIVE DIAGNOSIS:  Left intertrochanteric hip Fracture  POST-OPERATIVE DIAGNOSIS:  SAME  PROCEDURE: Left INTRAMEDULLARY (IM) NAIL INTERTROCHANTERIC  SURGEON:  Renee Harder  EBL:  287 cc  COMPLICATIONS:  none  OPERATIVE IMPLANTS: [employer] Intertan femoral nail 11.5 mm x 180 mm  PREOPERATIVE INDICATIONS:  Steve Vance is a 61 y.o. year old who fell and suffered a hip fracture. He was brought into the ER and then admitted and optimized and then elected for surgical intervention.    The risks benefits and alternatives were discussed with the patient including but not limited to the risks of nonoperative treatment, versus surgical intervention including infection, bleeding, nerve injury, malunion, nonunion, hardware prominence, hardware failure, need for hardware removal, blood clots, cardiopulmonary complications, morbidity, mortality, among others, and they were willing to proceed.    OPERATIVE PROCEDURE:  The patient was brought to the operating room and placed in the supine position.  General anesthesia was administered. He was placed on the fracture table.  Closed reduction was performed under C-arm guidance. The length of the femur was also measured using fluoroscopy. Time out was then performed after sterile prep and drape. He received preoperative antibiotics.  Incision was made proximal to the greater trochanter. A guidewire was placed in the appropriate position. Confirmation was made on AP and lateral views. The above-named nail was opened. I opened the proximal femur with a reamer. I then placed the nail by hand easily down. I did not need to ream the femur.  Once the nail was completely seated, I placed a guidepin into the femoral head into the center center position through a second incision.  I measured the length, and then reamed the lateral cortex and up into the head.  I then placed the two interlocking lag screws. Slight compression was applied. Anatomic fixation achieved.  I then secured the proximal interlocking screws.  I then placed a distal interlocking screw through the jig.  I then removed the instruments, and took final C-arm pictures AP and lateral the entire length of the leg. Anatomic reconstruction was achieved, and the wounds were irrigated copiously and closed with Vicryl  followed by staples and dry sterile dressing. Sponge and needle count were correct.   The patient was awakened and returned to PACU in stable and satisfactory condition. There no complications and the patient tolerated the procedure well.   POSTOPERATIVE PLAN: He will be weightbearing as tolerated.   Ok to start DVT ppx POD#1-aspirin 325 mg twice daily x14 days Recommend Keflex x 7 days for infection prophylaxis given higher risk with ESRD Dressing change by nursing staff as needed to keep dressing clean and dry Outpatient f/u in clinic in 2 weeks for staple removal and xrays  Renee Harder

## 2022-04-29 NOTE — Assessment & Plan Note (Addendum)
-   Nephrology consult will be obtained. - The patient has hyperkalemia and will need hemodialysis this morning before any surgical intervention.. - Dr. Candiss Norse was notified about the patient.

## 2022-04-29 NOTE — Assessment & Plan Note (Signed)
-   We will continue his antihypertensives can hold off Zestril given hyperkalemia.

## 2022-04-29 NOTE — Progress Notes (Signed)
Patient seen, chart reviewed came in from home after he tripped at home and failed. Sustained hip fracture. Currently getting dialysis in the emergency room. Seen by orthopedic Dr. Sharlet Salina plans for surgery this afternoon. Continue all PO home meds for hypertension and a PRN hydralazine. Blood pressure elevated secondary to pain. DVT prophylaxis per orthopedic. Nephrology consultation for in-house dialysis  No charge

## 2022-04-29 NOTE — Assessment & Plan Note (Signed)
-   The patient will be placed on supplement coverage with NovoLog. - We will continue statin therapy.

## 2022-04-29 NOTE — Progress Notes (Signed)
Initial Nutrition Assessment  DOCUMENTATION CODES:   Not applicable  INTERVENTION:   -Once diet is advanced, add:  -Nepro Shake po BID, each supplement provides 425 kcal and 19 grams protein  -30 ml Prosource Plus TID, each supplement provides 100 kcals and 15 grams protein -Renal MVI daily  NUTRITION DIAGNOSIS:   Increased nutrient needs related to post-op healing as evidenced by estimated needs.  GOAL:   Patient will meet greater than or equal to 90% of their needs  MONITOR:   PO intake, Supplement acceptance, Diet advancement  REASON FOR ASSESSMENT:   Consult Assessment of nutrition requirement/status, Hip fracture protocol  ASSESSMENT:   Pt with medical history significant for end-stage renal disease on hemodialysis on TTS, systolic CHF with EF of 45 to 50%, type 2 diabetes mellitus, hypertension and dyslipidemia, who presented with acute onset of accidental mechanical fall with subsequent left hip pain.  Pt admitted with closed lt hip fracture.   Reviewed I/O's: -2.5 L x 24 hours  Orthopedics consult pending. Pt is currently NPO for potential procedure.   Per H&P, pt with fluid overload manifested by pulmonary edema and ESRD. Suspect this may be masking further weight loss as well as fat and muscle depletions.   Reviewed wt hx; pt has experienced a 27.9% wt loss over the past 6 months, which is significant for time frame.   Pt is at high risk for malnutrition, however, unable to identify at this time. Pt would greatly benefit from addition of oral nutrition supplements.   Medications reviewed and include phoslo.   Labs reviewed: CBGS: 131. K: 5.3, Phos: 6.2.    Diet Order:   Diet Order             Diet NPO time specified  Diet effective now                   EDUCATION NEEDS:   No education needs have been identified at this time  Skin:  Skin Assessment: Reviewed RN Assessment  Last BM:  04/28/22  Height:   Ht Readings from Last 1  Encounters:  04/29/22 $RemoveB'5\' 7"'[id number]$  (1.702 m)    Weight:   Wt Readings from Last 1 Encounters:  04/29/22 80 kg    Ideal Body Weight:  67.3 kg  BMI:  Body mass index is 27.62 kg/m.  Estimated Nutritional Needs:   Kcal:  2000-2200  Protein:  100-115 grams  Fluid:  1000 ml + UOP    Loistine Chance, RD, LDN, [place] Registered Dietitian II Certified Diabetes Care and Education Specialist Please refer to [facility] for RD and/or RD on-call/weekend/after hours pager

## 2022-04-29 NOTE — Assessment & Plan Note (Addendum)
-   The patient will be admitted to a medical telemetry bed. - Pain management will be provided. - Orthopedic consult will be obtained. - Dr. Sharlet Salina was notified and is aware about the patient. - We will keep him n.p.o.

## 2022-04-30 ENCOUNTER — Encounter: Payer: Self-pay | Admitting: Orthopaedic Surgery

## 2022-04-30 DIAGNOSIS — S72002A Fracture of unspecified part of neck of left femur, initial encounter for closed fracture: Secondary | ICD-10-CM | POA: Diagnosis not present

## 2022-04-30 LAB — HEPATITIS B SURFACE ANTIBODY, QUANTITATIVE: Hep B S AB Quant (Post): <3.1 m[IU]/mL — ABNORMAL LOW (ref >9.9)

## 2022-04-30 LAB — GLUCOSE, CAPILLARY: Glucose-Capillary: 113 mg/dL — ABNORMAL HIGH (ref 70–99)

## 2022-04-30 MED ORDER — CARVEDILOL 3.125 MG PO TABS
6.2500 mg | ORAL_TABLET | Freq: Two times a day (BID) | ORAL | Status: DC
  Administered 2022-04-30 – 2022-05-03 (×7): 6.25 mg via ORAL
  Filled 2022-04-30 (×7): qty 2

## 2022-04-30 MED ORDER — ONDANSETRON HCL 4 MG/2ML IJ SOLN
4.0000 mg | Freq: Four times a day (QID) | INTRAMUSCULAR | Status: DC | PRN
  Administered 2022-04-30 – 2022-05-04 (×4): 4 mg via INTRAVENOUS
  Filled 2022-04-30 (×4): qty 2

## 2022-04-30 MED ORDER — MENTHOL 3 MG MT LOZG: 1.0000 | LOZENGE | OROMUCOSAL | Status: DC | PRN

## 2022-04-30 MED ORDER — HYDROCODONE-ACETAMINOPHEN 5-325 MG PO TABS
1.0000 | ORAL_TABLET | ORAL | Status: DC | PRN
  Administered 2022-04-30 – 2022-05-01 (×2): 2 via ORAL
  Administered 2022-05-01: 1 via ORAL
  Administered 2022-05-02: 2 via ORAL
  Administered 2022-05-02 (×3): 1 via ORAL
  Administered 2022-05-02: 2 via ORAL
  Administered 2022-05-03 – 2022-05-04 (×2): 1 via ORAL
  Filled 2022-04-30: qty 2
  Filled 2022-04-30: qty 1
  Filled 2022-04-30: qty 2
  Filled 2022-04-30 (×3): qty 1
  Filled 2022-04-30: qty 2
  Filled 2022-04-30 (×2): qty 1
  Filled 2022-04-30: qty 2

## 2022-04-30 MED ORDER — DOCUSATE SODIUM 100 MG PO CAPS
100.0000 mg | ORAL_CAPSULE | Freq: Two times a day (BID) | ORAL | Status: DC
  Administered 2022-04-30 – 2022-05-04 (×8): 100 mg via ORAL
  Filled 2022-04-30 (×8): qty 1

## 2022-04-30 MED ORDER — LACTULOSE 10 GM/15ML PO SOLN
20.0000 g | Freq: Two times a day (BID) | ORAL | Status: DC
Start: 2022-04-30 — End: 2022-05-04
  Administered 2022-04-30 – 2022-05-04 (×7): 20 g via ORAL
  Filled 2022-04-30 (×7): qty 30

## 2022-04-30 MED ORDER — ONDANSETRON HCL 4 MG PO TABS: 4.0000 mg | ORAL_TABLET | Freq: Four times a day (QID) | ORAL | Status: DC | PRN

## 2022-04-30 MED ORDER — BISACODYL 10 MG RE SUPP
10.0000 mg | Freq: Every day | RECTAL | Status: DC | PRN
  Filled 2022-04-30 (×2): qty 1

## 2022-04-30 MED ORDER — ACETAMINOPHEN 500 MG PO TABS
500.0000 mg | ORAL_TABLET | Freq: Four times a day (QID) | ORAL | Status: AC
  Administered 2022-04-30 – 2022-05-01 (×2): 500 mg via ORAL
  Filled 2022-04-30 (×3): qty 1

## 2022-04-30 MED ORDER — HEPARIN SODIUM (PORCINE) 5000 UNIT/ML IJ SOLN
5000.0000 [IU] | Freq: Three times a day (TID) | INTRAMUSCULAR | Status: DC
  Administered 2022-04-30 – 2022-05-04 (×12): 5000 [IU] via SUBCUTANEOUS
  Filled 2022-04-30 (×12): qty 1

## 2022-04-30 MED ORDER — MORPHINE SULFATE (PF) 2 MG/ML IV SOLN
0.5000 mg | INTRAVENOUS | Status: DC | PRN
  Administered 2022-04-30: 1 mg via INTRAVENOUS
  Filled 2022-04-30: qty 1

## 2022-04-30 MED ORDER — METOCLOPRAMIDE HCL 5 MG PO TABS: 5.0000 mg | ORAL_TABLET | Freq: Three times a day (TID) | ORAL | Status: DC | PRN

## 2022-04-30 MED ORDER — SODIUM CHLORIDE 0.9 % IV SOLN
12.5000 mg | Freq: Once | INTRAVENOUS | Status: AC
  Administered 2022-04-30: 12.5 mg via INTRAVENOUS
  Filled 2022-04-30: qty 0.5

## 2022-04-30 MED ORDER — ACETAMINOPHEN 325 MG PO TABS: 325.0000 mg | ORAL_TABLET | Freq: Four times a day (QID) | ORAL | Status: DC | PRN

## 2022-04-30 MED ORDER — LISINOPRIL 20 MG PO TABS
40.0000 mg | ORAL_TABLET | Freq: Every day | ORAL | Status: DC
  Administered 2022-04-30 – 2022-05-04 (×5): 40 mg via ORAL
  Filled 2022-04-30 (×5): qty 2

## 2022-04-30 MED ORDER — CEFAZOLIN SODIUM-DEXTROSE 2-4 GM/100ML-% IV SOLN
2.0000 g | Freq: Four times a day (QID) | INTRAVENOUS | Status: AC
  Administered 2022-04-30 (×2): 2 g via INTRAVENOUS
  Filled 2022-04-30 (×2): qty 100

## 2022-04-30 MED ORDER — HYDROCODONE-ACETAMINOPHEN 7.5-325 MG PO TABS: 1.0000 | ORAL_TABLET | ORAL | Status: DC | PRN

## 2022-04-30 MED ORDER — ASPIRIN 325 MG PO TBEC
325.0000 mg | DELAYED_RELEASE_TABLET | Freq: Two times a day (BID) | ORAL | Status: DC
  Administered 2022-04-30 – 2022-05-03 (×7): 325 mg via ORAL
  Filled 2022-04-30 (×7): qty 1

## 2022-04-30 MED ORDER — METOCLOPRAMIDE HCL 5 MG/ML IJ SOLN
5.0000 mg | Freq: Three times a day (TID) | INTRAMUSCULAR | Status: DC | PRN
  Administered 2022-04-30: 10 mg via INTRAVENOUS
  Filled 2022-04-30: qty 2

## 2022-04-30 MED ORDER — PHENOL 1.4 % MT LIQD: 1.0000 | OROMUCOSAL | Status: DC | PRN

## 2022-04-30 MED ORDER — HYDROCODONE-ACETAMINOPHEN 5-325 MG PO TABS
1.0000 | ORAL_TABLET | ORAL | Status: DC | PRN
  Administered 2022-04-30: 2 via ORAL

## 2022-04-30 MED ORDER — ASPIRIN 325 MG PO TBEC: 325.0000 mg | DELAYED_RELEASE_TABLET | Freq: Two times a day (BID) | ORAL | Status: DC

## 2022-04-30 NOTE — Evaluation (Signed)
Physical Therapy Evaluation Patient Details Name: Steve Vance MRN: [id number] DOB: 1960-12-05 Today's Date: 04/30/2022  History of Present Illness  presented to ER secondary to mechanical fall in home environment with acute onset of L hip pain; admitted for manaegment of L hip fracture, s/p IM nailing (04/29/22), WBAT.  Clinical Impression  Patient resting in bed upon arrival to room; initially sleeping, but arouses to max (and constant) encouragement throughout session.  Oriented to basic information; follows commands, but requires extensive encouragement for full participation and effort with all activities throughout session.  Rates L hip pain 8/10; meds received prior to session, additional unavailable at this time.  Demonstrates very guarded/limited strength (2- to 3+/5) and ROM (appox 25-40% normal ROM) throughout L hip, limited by pain; does require act assist from therapist for any/all movement, all planes. Currently requiring max assist +2 for bed mobility; mod/max assist +2 for sit/stand and bed/chair transfer with RW.    Demonstrates increased time and extreme encouragement for participation and effort with task.  Patient very vocal with pain; constant redirection and cuing for relaxation and deep breathing versus yelling with movement activities.  Very slow and deliberate with any/all movement attempts; act assist for advancement of L LE; very heavy WBing bilat UEs. Unsafe/unable to attempt additional gait at this time; will continue to assess/progress as appropriate.  Do anticipate need for heavy +2 and close chair follow.] Would benefit from skilled PT to address above deficits and promote optimal return to PLOF..;recommend transition to STR upon discharge from acute hospitalization.      Recommendations for follow up therapy are one component of a multi-disciplinary discharge planning process, led by the attending physician.  Recommendations may be updated based on patient status,  additional functional criteria and insurance authorization.  Follow Up Recommendations Skilled nursing-short term rehab (<3 hours/day) Can patient physically be transported by private vehicle: No    Assistance Recommended at Discharge Frequent or constant Supervision/Assistance  Patient can return home with the following  Two people to help with walking and/or transfers;Two people to help with bathing/dressing/bathroom    Equipment Recommendations    Recommendations for Other Services       Functional Status Assessment Patient has had a recent decline in their functional status and demonstrates the ability to make significant improvements in function in a reasonable and predictable amount of time.     Precautions / Restrictions Precautions Precautions: Fall Precaution Comments: No BP L UE Restrictions Weight Bearing Restrictions: Yes LLE Weight Bearing: Weight bearing as tolerated      Mobility  Bed Mobility Overal bed mobility: Needs Assistance Bed Mobility: Supine to Sit     Supine to sit: Max assist, +2 for physical assistance     General bed mobility comments: poor tolerance for dissociation of LEs and trunk with movement; max/total assist for movement of LEs and elevation of trunk    Transfers Overall transfer level: Needs assistance Equipment used: Rolling walker (2 wheels) Transfers: Sit to/from Stand, Bed to chair/wheelchair/BSC Sit to Stand: Mod assist, Max assist, +2 physical assistance, From elevated surface           General transfer comment: increased time and extreme encouragement for participation and effort with task.  Patient very vocal with pain; constant redirection and cuing for relaxation and deep breathing versus yelling with movement activities.  Very slow and deliberate with any/all movement attempts; act assist for advancement of L LE; very heavy WBing bilat UEs    Ambulation/Gait  General Gait Details: unsafe/unable to  tolerate due to pain  Stairs            Wheelchair Mobility    Modified Rankin (Stroke Patients Only)       Balance Overall balance assessment: Needs assistance Sitting-balance support: No upper extremity supported, Feet supported Sitting balance-Leahy Scale: Fair     Standing balance support: Bilateral upper extremity supported Standing balance-Leahy Scale: Poor                               Pertinent Vitals/Pain Pain Assessment Pain Assessment: Faces Faces Pain Scale: Hurts whole lot Pain Location: L hip Pain Descriptors / Indicators: Aching, Grimacing, Guarding Pain Intervention(s): Limited activity within patient's tolerance, Monitored during session, Premedicated before session, Repositioned, Patient requesting pain meds-RN notified    Home Living Family/patient expects to be discharged to:: Private residence Living Arrangements: Alone Available Help at Discharge: Friend(s);Available PRN/intermittently Type of Home: House Home Access: Ramped entrance       Home Layout: Two level;Bed/bath upstairs Home Equipment: Rolling Walker (2 wheels);Cane - single point      Prior Function Prior Level of Function : Independent/Modified Independent             Mobility Comments: Mod indep with ADLs, household and community mobilization without assist device; denies additional fall history; + drivingto/from dialysis       Hand Dominance   Dominant Hand: Right    Extremity/Trunk Assessment   Upper Extremity Assessment Upper Extremity Assessment: Overall WFL for tasks assessed    Lower Extremity Assessment Lower Extremity Assessment: Generalized weakness (L hip grossly 2+ to 3-/5, limited by pain)       Communication   Communication: No difficulties  Cognition Arousal/Alertness: Awake/alert Behavior During Therapy: WFL for tasks assessed/performed Overall Cognitive Status: Within Functional Limits for tasks assessed                                           General Comments      Exercises Other Exercises Other Exercises: Supine LE therex, 1x10, act assist: ankle pumps, quad sets, SAQs, heel slides, hip abduct/adduct.  Very guarded movement, tolerating/allowing movement only 25-40% normal range of L hip (due to pain and avoidant behaviors)   Assessment/Plan    PT Assessment Patient needs continued PT services  PT Problem List Decreased strength;Decreased range of motion;Decreased knowledge of use of DME;Decreased activity tolerance;Decreased balance;Decreased mobility;Decreased coordination;Decreased cognition;Decreased knowledge of precautions;Decreased safety awareness;Pain       PT Treatment Interventions DME instruction;Gait training;Functional mobility training;Therapeutic activities;Therapeutic exercise;Balance training;Patient/family education    PT Goals (Current goals can be found in the Care Plan section)  Acute Rehab PT Goals Patient Stated Goal: to make the pain better PT Goal Formulation: With patient Time For Goal Achievement: 05/14/22 Potential to Achieve Goals: Fair    Frequency 7X/week     Co-evaluation               AM-PAC PT "6 Clicks" Mobility  Outcome Measure Help needed turning from your back to your side while in a flat bed without using bedrails?: A Lot Help needed moving from lying on your back to sitting on the side of a flat bed without using bedrails?: Total Help needed moving to and from a bed to a chair (including a wheelchair)?: A Lot Help  needed standing up from a chair using your arms (e.g., wheelchair or bedside chair)?: A Lot Help needed to walk in hospital room?: Total Help needed climbing 3-5 steps with a railing? : Total 6 Click Score: 9    End of Session Equipment Utilized During Treatment: Gait belt Activity Tolerance: Patient limited by pain Patient left: in chair;with call bell/phone within reach;with chair alarm set Nurse Communication:  Mobility status PT Visit Diagnosis: Muscle weakness (generalized) (M62.81);Difficulty in walking, not elsewhere classified (R26.2);Pain Pain - Right/Left: Left Pain - part of body: Hip    Time: 0920-1011 PT Time Calculation (min) (ACUTE ONLY): 51 min   Charges:   PT Evaluation $PT Eval Moderate Complexity: 1 Mod PT Treatments $Therapeutic Exercise: 8-22 mins $Therapeutic Activity: 8-22 mins        Dawana Asper H. Owens Shark, PT, DPT, NCS 04/30/22, 2:53 PM [phone]

## 2022-04-30 NOTE — Progress Notes (Signed)
     Subjective: 1 Day Post-Op Procedure(s) (LRB): INTRAMEDULLARY (IM) NAIL INTERTROCHANTERIC (Left)   Patient reports pain as moderate. Questioning how he is going to get up with PT.  Otherwise no reported events throughout the night.      Objective:   VITALS:   Vitals:   04/30/22 0521 04/30/22 0747  BP: (!) 153/82 (!) 163/85  Pulse: 75 81  Resp: 20   Temp: 98.2 F (36.8 C) 97.9 F (36.6 C)  SpO2: 100% 100%    Neurovascular intact Dorsiflexion/Plantar flexion intact Incision: no drainage Compartment soft  LABS Recent Labs    04/29/22 0148 04/29/22 0456  HGB 11.5* 12.4*  HCT 36.3* 39.3  WBC 8.0 7.3  PLT 238 211    Recent Labs    04/29/22 0148 04/29/22 0456  NA 135 136  K 5.8* 5.3*  BUN 70* 64*  CREATININE 12.40* 11.04*  GLUCOSE 132* 118*     Assessment/Plan: 1 Day Post-Op Procedure(s) (LRB): INTRAMEDULLARY (IM) NAIL INTERTROCHANTERIC (Left)   Advance diet Up with therapy  POSTOPERATIVE PLAN: He will be weightbearing as tolerated.   Ok to start DVT ppx POD#1-aspirin 325 mg twice daily x14 days Recommend Keflex x 7 days for infection prophylaxis given higher risk with ESRD Dressing change by nursing staff as needed to keep dressing clean and dry Outpatient f/u in clinic in 2 weeks for staple removal and [place] PA-C  [place]
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
## 2022-04-30 NOTE — Anesthesia Postprocedure Evaluation (Signed)
Anesthesia Post Note  Patient: Steve Vance  Procedure(s) Performed: INTRAMEDULLARY (IM) NAIL INTERTROCHANTERIC (Left: Hip)  Patient location during evaluation: PACU Anesthesia Type: General Level of consciousness: awake and alert Pain management: pain level controlled Vital Signs Assessment: post-procedure vital signs reviewed and stable Respiratory status: spontaneous breathing, nonlabored ventilation, respiratory function stable and patient connected to nasal cannula oxygen Cardiovascular status: blood pressure returned to baseline and stable Postop Assessment: no apparent nausea or vomiting Anesthetic complications: no   No notable events documented.   Last Vitals:  Vitals:   04/29/22 2117 04/30/22 0521  BP: (!) 170/80 (!) 153/82  Pulse: 71 75  Resp: 16 20  Temp: 36.5 C 36.8 C  SpO2: 100% 100%    Last Pain:  Vitals:   04/29/22 2117  TempSrc: Oral  PainSc:                  [name]

## 2022-04-30 NOTE — Progress Notes (Signed)
[place] [facility] [place] NAME: Steve Vance    MR#:  [id number]  DATE OF BIRTH:  08-25-1961  SUBJECTIVE:  left hip pain. Tolerating PO diet. No family at bedside. Does not remember when he had last BM.   VITALS:  Blood pressure (!) 163/85, pulse 81, temperature 97.9 F (36.6 C), resp. rate 20, height $RemoveBe'5\' 7"'krfoOkVTV$  (1.702 m), weight 80 kg, SpO2 100 %.  PHYSICAL EXAMINATION:   GENERAL:  61 y.o.-year-old patient lying in the bed with no acute distress. Looks older than stated age LUNGS: Normal breath sounds bilaterally, no wheezing, rales, rhonchi.  CARDIOVASCULAR: S1, S2 normal. No murmurs, rubs, or gallops.  ABDOMEN: Soft, nontender, nondistended. Bowel sounds present.  EXTREMITIES: No  edema b/l.   HD access NEUROLOGIC: nonfocal  patient is alert and awake SKIN: No obvious rash, lesion, or ulcer.   LABORATORY PANEL:  CBC Recent Labs  Lab 04/29/22 0456  WBC 7.3  HGB 12.4*  HCT 39.3  PLT 211    Chemistries  Recent Labs  Lab 04/29/22 0148 04/29/22 0456  NA 135 136  K 5.8* 5.3*  CL 96* 102  CO2 21* 20*  GLUCOSE 132* 118*  BUN 70* 64*  CREATININE 12.40* 11.04*  CALCIUM 8.0* 7.3*  AST 27  --   ALT 16  --   ALKPHOS 80  --   BILITOT 1.0  --    Cardiac Enzymes No results for input(s): "TROPONINI" in the last 168 hours. RADIOLOGY:  DG HIP UNILAT WITH PELVIS 2-3 VIEWS LEFT  Result Date: 04/29/2022 CLINICAL DATA:  Left hip pinning EXAM: DG HIP (WITH OR WITHOUT PELVIS) 2-3V LEFT COMPARISON:  04/29/2022 FINDINGS: Intraoperative imaging demonstrates internal fixation across the left intertrochanteric fracture. Anatomic alignment. No hardware complicating feature. IMPRESSION: Internal fixation.  No visible complicating feature. Electronically Signed   By: Rolm Baptise M.D.   On: 04/29/2022 19:12   DG C-Arm 1-60 Min-No Report  Result Date: 04/29/2022 Fluoroscopy was utilized by the requesting physician.  No radiographic interpretation.    DG Chest 1 View  Result Date: 04/29/2022 CLINICAL DATA:  Fall, left hip fracture EXAM: CHEST  1 VIEW COMPARISON:  03/04/2022 FINDINGS: Lung volumes are small, but are symmetric and are stable since prior examination. Perihilar and lower lung zone interstitial infiltrates are present, similar to prior examination, suggestive of mild chronic cardiogenic pulmonary edema. Mild cardiomegaly is stable. No pneumothorax or pleural effusion. No acute bone abnormality. IMPRESSION: Mild chronic cardiogenic pulmonary edema.  Stable cardiomegaly. Electronically Signed   By: Fidela Salisbury M.D.   On: 04/29/2022 02:59   DG HIP UNILAT WITH PELVIS 2-3 VIEWS LEFT  Result Date: 04/29/2022 CLINICAL DATA:  Fall, left hip pain EXAM: DG HIP (WITH OR WITHOUT PELVIS) 2-3V LEFT COMPARISON:  None Available. FINDINGS: There is an acute, intratrochanteric fracture of the left hip with 1/2 shaft with lateral displacement and mild override of the distal fracture fragment. Femoral head is still seated within the left acetabulum. Left hip joint space is preserved. Limited evaluation of the right hip is unremarkable. Pelvis is intact. Vascular calcifications are noted. IMPRESSION: Acute, displaced intratrochanteric fracture of the left hip. Electronically Signed   By: Fidela Salisbury M.D.   On: 04/29/2022 02:57    Assessment and Plan  Steve Vance is a 61 y.o. African-American male male with medical history significant for end-stage renal disease on hemodialysis on TTS, systolic CHF with EF of 45 to 50%, type 2 diabetes mellitus,  hypertension and dyslipidemia, who presented to the ER with acute onset of accidental mechanical fall with subsequent left hip pain.  The patient fell a couple of steps as his foot gave way per his report.  Closed left hip fracture (HCC) mechanical fall -- patient is status post surgery by Dr. Sharlet Salina -start PT OT- --PRN pain meds -- TOC for discharge planning    ESRD on dialysis [facility]) - Nephrology  consult with Dr. Candiss Norse  -- patient was dialyzed before surgery and will be placed on his routine dialysis schedule  Type 2 diabetes mellitus with hyperlipidemia ([place]) - continue SSI, not on any po meds for DM at home - \continue statin therapy.   Essential hypertension -- resume home meds   GERD without esophagitis - continue H2 blocker therapy.  PT OT to see patient. TOC for discharge planning   Procedures: left hip surgery Family communication : none Consults : orthopedic CODE STATUS: full DVT Prophylaxis : heparin Level of care: Telemetry Medical Status is: Inpatient Remains inpatient appropriate because: postop day one hip surgery. PT OT and TOC for discharge planning    TOTAL TIME TAKING CARE OF THIS PATIENT: 35 minutes.  >50% time spent on counselling and coordination of care  Note: This dictation was prepared with Dragon dictation along with smaller phrase technology. Any transcriptional errors that result from this process are unintentional.  Fritzi Mandes M.D    [facility]   CC: Primary care physician; Alene Mires, Elyse Jarvis, MD

## 2022-04-30 NOTE — Progress Notes (Signed)
[facility]  ROUNDING NOTE   Subjective:   Steve Vance is a 62 year old male with past medical conditions including type 2 diabetes, hypertension, dyslipidemia, systolic heart failure with EF 45 to 50% and end-stage renal disease on hemodialysis.  Patient presents to the emergency department after suffering a fall with left hip pain.  Patient has been admitted for ESRD (end stage renal disease) on dialysis ([place]) [N18.6, Z99.2] Closed left hip fracture ([place]) [S72.002A] Closed displaced intertrochanteric fracture of left femur, initial encounter [facility]) [S72.142A]  Patient is known to our practice and receives outpatient dialysis treatments at [facility] on a TTS schedule.    Patient seen laying in bed, alert and oriented Therapy at bedside performing bed exercises Currently complains of soreness Remains on room air   Objective:  Vital signs in last 24 hours:  Temp:  [97.2 F (36.2 C)-98.2 F (36.8 C)] 97.9 F (36.6 C) (08/25 0747) Pulse Rate:  [63-102] 81 (08/25 0747) Resp:  [9-28] 20 (08/25 0521) BP: (148-202)/(75-102) 163/85 (08/25 0747) SpO2:  [85 %-100 %] 100 % (08/25 0747)  Weight change:  Filed Weights   04/29/22 0148  Weight: 80 kg    Intake/Output: I/O last 3 completed shifts: In: 200 [I.V.:200] Out: 2500 [Other:2500]   Intake/Output this shift:  No intake/output data recorded.  Physical Exam: General: Ill-appearing  Head: Normocephalic, atraumatic. Moist oral mucosal membranes  Eyes: Anicteric  Lungs:  Diminished in bases, normal effort, room air  Heart: Regular rate and rhythm  Abdomen:  Soft, nontender, obese  Extremities: None peripheral edema.  Neurologic: Nonfocal, moving all four extremities  Skin: No lesions.  Left hip surgical dressing.  Access: Left aVF    Basic Metabolic Panel: Recent Labs  Lab 04/29/22 0148 04/29/22 0456  NA 135 136  K 5.8* 5.3*  CL 96* 102  CO2 21* 20*  GLUCOSE 132* 118*  BUN 70* 64*   CREATININE 12.40* 11.04*  CALCIUM 8.0* 7.3*     Liver Function Tests: Recent Labs  Lab 04/29/22 0148  AST 27  ALT 16  ALKPHOS 80  BILITOT 1.0  PROT 8.3*  ALBUMIN 3.3*    No results for input(s): "LIPASE", "AMYLASE" in the last 168 hours. No results for input(s): "AMMONIA" in the last 168 hours.  CBC: Recent Labs  Lab 04/29/22 0148 04/29/22 0456  WBC 8.0 7.3  HGB 11.5* 12.4*  HCT 36.3* 39.3  MCV 88.8 89.3  PLT 238 211     Cardiac Enzymes: No results for input(s): "CKTOTAL", "CKMB", "CKMBINDEX", "TROPONINI" in the last 168 hours.  BNP: Invalid input(s): "POCBNP"  CBG: Recent Labs  Lab 04/29/22 1700 04/29/22 1913  GLUCAP 113* 98    Microbiology: Results for orders placed or performed during the hospital encounter of 03/04/22  Blood Culture (routine x 2)     Status: Abnormal   Collection Time: 03/04/22  1:52 PM   Specimen: BLOOD  Result Value Ref Range Status   Specimen Description   Final    BLOOD RAC Performed at [facility], [address], [place], [place] [address]    Special Requests   Final    BOTTLES DRAWN AEROBIC AND ANAEROBIC BCAV Performed at [facility], [address], [place], [place] [address]    Culture  Setup Time   Final    Organism ID to follow GRAM POSITIVE COCCI IN BOTH AEROBIC AND ANAEROBIC BOTTLES CRITICAL RESULT CALLED TO, READ BACK BY AND VERIFIED WITH: JASON BELEUE $[email or web address]'[email or web address]$  ON 03/05/22 SKL  GRAM STAIN REVIEWED-AGREE WITH RESULT    Culture (A)  Final    GROUP B STREP(S.AGALACTIAE)ISOLATED STAPHYLOCOCCUS AUREUS CRITICAL RESULT CALLED TO, READ BACK BY AND VERIFIED WITH: B. BIERS PHARMD, AT [phone] 03/07/22 D. VANHOOK Performed at [facility], [place] [address] [place], [place] [address]    Report Status 03/08/2022 FINAL  Final   Organism ID, Bacteria GROUP B STREP(S.AGALACTIAE)ISOLATED  Final   Organism ID, Bacteria STAPHYLOCOCCUS AUREUS  Final      Susceptibility   Group b strep(s.agalactiae)isolated  - MIC*    CLINDAMYCIN <=0.25 SENSITIVE Sensitive     AMPICILLIN <=0.25 SENSITIVE Sensitive     ERYTHROMYCIN <=0.12 SENSITIVE Sensitive     VANCOMYCIN 0.5 SENSITIVE Sensitive     CEFTRIAXONE <=0.12 SENSITIVE Sensitive     LEVOFLOXACIN 1 SENSITIVE Sensitive     PENICILLIN Value in next row Sensitive      SENSITIVE<=0.06    * GROUP B STREP(S.AGALACTIAE)ISOLATED   Staphylococcus aureus - MIC*    CIPROFLOXACIN Value in next row Sensitive      SENSITIVE<=0.06    ERYTHROMYCIN Value in next row Sensitive      SENSITIVE<=0.06    GENTAMICIN Value in next row Sensitive      SENSITIVE<=0.06    OXACILLIN Value in next row Sensitive      SENSITIVE<=0.06    TETRACYCLINE Value in next row Sensitive      SENSITIVE<=0.06    VANCOMYCIN Value in next row Sensitive      SENSITIVE<=0.06    TRIMETH/SULFA Value in next row Sensitive      SENSITIVE<=0.06    CLINDAMYCIN Value in next row Sensitive      SENSITIVE<=0.06    RIFAMPIN Value in next row Sensitive      SENSITIVE<=0.06    Inducible Clindamycin Value in next row Sensitive      SENSITIVE<=0.06    * STAPHYLOCOCCUS AUREUS  Blood Culture ID Panel (Reflexed)     Status: Abnormal   Collection Time: 03/04/22  1:52 PM  Result Value Ref Range Status   Enterococcus faecalis NOT DETECTED NOT DETECTED Final   Enterococcus Faecium NOT DETECTED NOT DETECTED Final   Listeria monocytogenes NOT DETECTED NOT DETECTED Final   Staphylococcus species NOT DETECTED NOT DETECTED Final   Staphylococcus aureus (BCID) NOT DETECTED NOT DETECTED Final   Staphylococcus epidermidis NOT DETECTED NOT DETECTED Final   Staphylococcus lugdunensis NOT DETECTED NOT DETECTED Final   Streptococcus species DETECTED (A) NOT DETECTED Final    Comment: CRITICAL RESULT CALLED TO, READ BACK BY AND VERIFIED WITH: JASON BELEUE $RemoveBefo'[email or web address]$  ON 03/05/22 SKL    Streptococcus agalactiae DETECTED (A) NOT DETECTED Final    Comment: CRITICAL RESULT CALLED TO, READ BACK BY AND VERIFIED WITH: JASON  BELEUE $Remov'[email or web address]$  ON 03/05/22 SKL    Streptococcus pneumoniae NOT DETECTED NOT DETECTED Final   Streptococcus pyogenes NOT DETECTED NOT DETECTED Final   A.calcoaceticus-baumannii NOT DETECTED NOT DETECTED Final   Bacteroides fragilis NOT DETECTED NOT DETECTED Final   Enterobacterales NOT DETECTED NOT DETECTED Final   Enterobacter cloacae complex NOT DETECTED NOT DETECTED Final   Escherichia coli NOT DETECTED NOT DETECTED Final   Klebsiella aerogenes NOT DETECTED NOT DETECTED Final   Klebsiella oxytoca NOT DETECTED NOT DETECTED Final   Klebsiella pneumoniae NOT DETECTED NOT DETECTED Final   Proteus species NOT DETECTED NOT DETECTED Final   Salmonella species NOT DETECTED NOT DETECTED Final   Serratia marcescens NOT DETECTED NOT DETECTED Final   Haemophilus influenzae NOT DETECTED NOT  DETECTED Final   Neisseria meningitidis NOT DETECTED NOT DETECTED Final   Pseudomonas aeruginosa NOT DETECTED NOT DETECTED Final   Stenotrophomonas maltophilia NOT DETECTED NOT DETECTED Final   Candida albicans NOT DETECTED NOT DETECTED Final   Candida auris NOT DETECTED NOT DETECTED Final   Candida glabrata NOT DETECTED NOT DETECTED Final   Candida krusei NOT DETECTED NOT DETECTED Final   Candida parapsilosis NOT DETECTED NOT DETECTED Final   Candida tropicalis NOT DETECTED NOT DETECTED Final   Cryptococcus neoformans/gattii NOT DETECTED NOT DETECTED Final    Comment: Performed at [facility], [address] [place], [place] [address]  Blood Culture (routine x 2)     Status: Abnormal   Collection Time: 03/04/22  1:57 PM   Specimen: BLOOD  Result Value Ref Range Status   Specimen Description BLOOD BLOOD RIGHT FOREARM  Final   Special Requests   Final    BOTTLES DRAWN AEROBIC AND ANAEROBIC Blood Culture results may not be optimal due to an inadequate volume of blood received in culture bottles   Culture  Setup Time   Final    GRAM POSITIVE COCCI IN BOTH AEROBIC AND ANAEROBIC BOTTLES CRITICAL  VALUE NOTED.  VALUE IS CONSISTENT WITH PREVIOUSLY REPORTED AND CALLED VALUE.    Culture (A)  Final    GROUP B STREP(S.AGALACTIAE)ISOLATED STAPHYLOCOCCUS AUREUS CRITICAL RESULT CALLED TO, READ BACK BY AND VERIFIED WITH: B. BIERS PHARMD, AT 2956 03/07/22 BY D. VANHOOK SUSCEPTIBILITIES PERFORMED ON PREVIOUS CULTURE WITHIN THE LAST 5 DAYS. Performed at [facility], [place] [address], [place], [place] [address]    Report Status 03/08/2022 FINAL  Final  Resp Panel by RT-PCR (Flu A&B, Covid) Anterior Nasal Swab     Status: None   Collection Time: 03/04/22  2:12 PM   Specimen: Anterior Nasal Swab  Result Value Ref Range Status   SARS Coronavirus 2 by RT PCR NEGATIVE NEGATIVE Final    Comment: (NOTE) SARS-CoV-2 target nucleic acids are NOT DETECTED.  The SARS-CoV-2 RNA is generally detectable in upper respiratory specimens during the acute phase of infection. The lowest concentration of SARS-CoV-2 viral copies this assay can detect is 138 copies/mL. A negative result does not preclude SARS-Cov-2 infection and should not be used as the sole basis for treatment or other patient management decisions. A negative result may occur with  improper specimen collection/handling, submission of specimen other than nasopharyngeal swab, presence of viral mutation(s) within the areas targeted by this assay, and inadequate number of viral copies(<138 copies/mL). A negative result must be combined with clinical observations, patient history, and epidemiological information. The expected result is Negative.  Fact Sheet for Patients:  EntrepreneurPulse.com.au  Fact Sheet for Healthcare Providers:  IncredibleEmployment.be  This test is no t yet approved or cleared by the [place] FDA and  has been authorized for detection and/or diagnosis of SARS-CoV-2 by FDA under an Emergency Use Authorization (EUA). This EUA will remain  in effect (meaning this test can be  used) for the duration of the COVID-19 declaration under Section 564(b)(1) of the Act, 21 U.S.C.section 360bbb-3(b)(1), unless the authorization is terminated  or revoked sooner.       Influenza A by PCR NEGATIVE NEGATIVE Final   Influenza B by PCR NEGATIVE NEGATIVE Final    Comment: (NOTE) The Xpert Xpress SARS-CoV-2/FLU/RSV plus assay is intended as an aid in the diagnosis of influenza from Nasopharyngeal swab specimens and should not be used as a sole basis for treatment. Nasal washings and  aspirates are unacceptable for Xpert Xpress SARS-CoV-2/FLU/RSV testing.  Fact Sheet for Patients: EntrepreneurPulse.com.au  Fact Sheet for Healthcare Providers: IncredibleEmployment.be  This test is not yet approved or cleared by the [place] FDA and has been authorized for detection and/or diagnosis of SARS-CoV-2 by FDA under an Emergency Use Authorization (EUA). This EUA will remain in effect (meaning this test can be used) for the duration of the COVID-19 declaration under Section 564(b)(1) of the Act, 21 U.S.C. section 360bbb-3(b)(1), unless the authorization is terminated or revoked.  Performed at [facility], [place], [place] [address]   C Difficile Quick Screen w PCR reflex     Status: None   Collection Time: 03/04/22  9:21 PM   Specimen: Stool  Result Value Ref Range Status   C Diff antigen NEGATIVE NEGATIVE Final   C Diff toxin NEGATIVE NEGATIVE Final   C Diff interpretation No C. difficile detected.  Final    Comment: Performed at [facility], [address] [place], [place] [address]  Gastrointestinal Panel by PCR , Stool     Status: None   Collection Time: 03/04/22  9:21 PM   Specimen: Stool  Result Value Ref Range Status   Campylobacter species NOT DETECTED NOT DETECTED Final   Plesimonas shigelloides NOT DETECTED NOT DETECTED Final   Salmonella species NOT DETECTED NOT DETECTED Final    Yersinia enterocolitica NOT DETECTED NOT DETECTED Final   Vibrio species NOT DETECTED NOT DETECTED Final   Vibrio cholerae NOT DETECTED NOT DETECTED Final   Enteroaggregative E coli (EAEC) NOT DETECTED NOT DETECTED Final   Enteropathogenic E coli (EPEC) NOT DETECTED NOT DETECTED Final   Enterotoxigenic E coli (ETEC) NOT DETECTED NOT DETECTED Final   Shiga like toxin producing E coli (STEC) NOT DETECTED NOT DETECTED Final   Shigella/Enteroinvasive E coli (EIEC) NOT DETECTED NOT DETECTED Final   Cryptosporidium NOT DETECTED NOT DETECTED Final   Cyclospora cayetanensis NOT DETECTED NOT DETECTED Final   Entamoeba histolytica NOT DETECTED NOT DETECTED Final   Giardia lamblia NOT DETECTED NOT DETECTED Final   Adenovirus F40/41 NOT DETECTED NOT DETECTED Final   Astrovirus NOT DETECTED NOT DETECTED Final   Norovirus GI/GII NOT DETECTED NOT DETECTED Final   Rotavirus A NOT DETECTED NOT DETECTED Final   Sapovirus (I, II, IV, and V) NOT DETECTED NOT DETECTED Final    Comment: Performed at [facility], [address] [place], [place] [address]  Urine Culture     Status: Abnormal   Collection Time: 03/04/22  9:39 PM   Specimen: In/Out Cath Urine  Result Value Ref Range Status   Specimen Description   Final    IN/OUT CATH URINE Performed at [facility], [address], [place], [place] [address]    Special Requests   Final    NONE Performed at [facility], [place] [place],  [address]    Culture MULTIPLE SPECIES PRESENT, SUGGEST RECOLLECTION (A)  Final   Report Status 03/07/2022 FINAL  Final  Culture, blood (Routine X 2) w Reflex to ID Panel     Status: None   Collection Time: 03/06/22  4:50 AM   Specimen: BLOOD  Result Value Ref Range Status   Specimen Description BLOOD RIGHT ANTECUBITAL  Final   Special Requests   Final    BOTTLES DRAWN AEROBIC AND ANAEROBIC Blood Culture adequate volume   Culture   Final    NO GROWTH 5 DAYS Performed at  [facility], [address] [place], [place] [address]    Report Status 03/11/2022 FINAL  Final  Culture, blood (Routine X 2) w Reflex to ID Panel     Status: None   Collection Time: 03/06/22  4:50 AM   Specimen: BLOOD  Result Value Ref Range Status   Specimen Description BLOOD BLOOD RIGHT WRIST  Final   Special Requests   Final    BOTTLES DRAWN AEROBIC AND ANAEROBIC Blood Culture adequate volume   Culture   Final    NO GROWTH 5 DAYS Performed at [facility], [address] [place], [place] [address]    Report Status 03/11/2022 FINAL  Final    Coagulation Studies: Recent Labs    04/29/22 0148  LABPROT 14.5  INR 1.1     Urinalysis: No results for input(s): "COLORURINE", "LABSPEC", "PHURINE", "GLUCOSEU", "HGBUR", "BILIRUBINUR", "KETONESUR", "PROTEINUR", "UROBILINOGEN", "NITRITE", "LEUKOCYTESUR" in the last 72 hours.  Invalid input(s): "APPERANCEUR"    Imaging: DG HIP UNILAT WITH PELVIS 2-3 VIEWS LEFT  Result Date: 04/29/2022 CLINICAL DATA:  Left hip pinning EXAM: DG HIP (WITH OR WITHOUT PELVIS) 2-3V LEFT COMPARISON:  04/29/2022 FINDINGS: Intraoperative imaging demonstrates internal fixation across the left intertrochanteric fracture. Anatomic alignment. No hardware complicating feature. IMPRESSION: Internal fixation.  No visible complicating feature. Electronically Signed   By: Charlett Nose M.D.   On: 04/29/2022 19:12   DG C-Arm 1-60 Min-No Report  Result Date: 04/29/2022 Fluoroscopy was utilized by the requesting physician.  No radiographic interpretation.   DG Chest 1 View  Result Date: 04/29/2022 CLINICAL DATA:  Fall, left hip fracture EXAM: CHEST  1 VIEW COMPARISON:  03/04/2022 FINDINGS: Lung volumes are small, but are symmetric and are stable since prior examination. Perihilar and lower lung zone interstitial infiltrates are present, similar to prior examination, suggestive of mild chronic cardiogenic pulmonary edema. Mild cardiomegaly is stable.  No pneumothorax or pleural effusion. No acute bone abnormality. IMPRESSION: Mild chronic cardiogenic pulmonary edema.  Stable cardiomegaly. Electronically Signed   By: Helyn Numbers M.D.   On: 04/29/2022 02:59   DG HIP UNILAT WITH PELVIS 2-3 VIEWS LEFT  Result Date: 04/29/2022 CLINICAL DATA:  Fall, left hip pain EXAM: DG HIP (WITH OR WITHOUT PELVIS) 2-3V LEFT COMPARISON:  None Available. FINDINGS: There is an acute, intratrochanteric fracture of the left hip with 1/2 shaft with lateral displacement and mild override of the distal fracture fragment. Femoral head is still seated within the left acetabulum. Left hip joint space is preserved. Limited evaluation of the right hip is unremarkable. Pelvis is intact. Vascular calcifications are noted. IMPRESSION: Acute, displaced intratrochanteric fracture of the left hip. Electronically Signed   By: Helyn Numbers M.D.   On: 04/29/2022 02:57     Medications:    anticoagulant sodium citrate      ceFAZolin (ANCEF) IV      ceFAZolin (ANCEF) IV      (feeding supplement) PROSource Plus  30 mL Oral TID BM   acetaminophen  500 mg Oral Q6H   amLODipine  10 mg Oral Daily   aspirin EC  325 mg Oral BID   atorvastatin  10 mg Oral Daily   calcium acetate  667 mg Oral TID WC   carvedilol  12.5 mg Oral BID   Chlorhexidine Gluconate Cloth  6 each Topical Q0600   docusate sodium  100 mg Oral BID   famotidine  20 mg Oral Daily   feeding supplement (NEPRO CARB STEADY)  237 mL Oral BID BM  isosorbide mononitrate  30 mg Oral Daily   multivitamin  1 tablet Oral QHS   acetaminophen, albuterol, alteplase, anticoagulant sodium citrate, heparin, hydrALAZINE, HYDROcodone-acetaminophen, HYDROcodone-acetaminophen, lidocaine (PF), lidocaine-prilocaine, magnesium hydroxide, menthol-cetylpyridinium **OR** phenol, metoCLOPramide **OR** metoCLOPramide (REGLAN) injection, morphine injection, ondansetron **OR** ondansetron (ZOFRAN) IV, pentafluoroprop-tetrafluoroeth,  traZODone  Assessment/ Plan:  [name] is a 61 y.o.  male past medical conditions including type 2 diabetes, hypertension, dyslipidemia, systolic heart failure with EF 45 to 50% and end-stage renal disease on hemodialysis.  Patient presents to the emergency department after suffering a fall with left hip pain.  Patient has been admitted for ESRD (end stage renal disease) on dialysis ([place]) [N18.6, Z99.2] Closed left hip fracture ([place]) [S72.002A] Closed displaced intertrochanteric fracture of left femur, initial encounter ([place]) [S72.142A]  [facility]/TTS/left aVF  End-stage renal disease with hyperkalemia on hemodialysis.  Will maintain outpatient schedule if possible.  Patient received scheduled dialysis yesterday, UF 2.5 L achieved.  Next treatment scheduled for Saturday.  Renal navigator monitoring discharge plan which will likely include rehab placement.  2. Anemia of chronic kidney disease Lab Results  Component Value Date   HGB 12.4 (L) 04/29/2022    Patient receives [place] outpatient.  Hemoglobin within desired target.  3. Diabetes mellitus type II with chronic kidney disease/renal manifestations: noninsulin dependent.  Most recent hemoglobin A1c is 5.6 on 04/29/22.   Glucose well controlled.  4.  Hypertension with chronic kidney disease.  Home regimen includes amlodipine, carvedilol, isosorbide, and lisinopril.Lisinopril currently held during admission.  Blood pressure163/85.  5. Secondary Hyperparathyroidism: with outpatient labs: PTH 759, phosphorus 11.5, calcium 8.6 on 04/22/22.   Lab Results  Component Value Date   PTH 301 (H) 09/24/2020   CALCIUM 7.3 (L) 04/29/2022   CAION 1.21 06/27/2019   PHOS 6.2 (H) 08/14/2021  Calcium acetate ordered with meals outpatient.  Calcium below target.  Continue calcium acetate.  Will order labs for a.m.     LOS: 1   8/25/202312:32 PM

## 2022-05-01 DIAGNOSIS — S72002A Fracture of unspecified part of neck of left femur, initial encounter for closed fracture: Secondary | ICD-10-CM | POA: Diagnosis not present

## 2022-05-01 LAB — BASIC METABOLIC PANEL
Anion gap: 14 (ref 5–15)
BUN: 59 mg/dL — ABNORMAL HIGH (ref 8–23)
CO2: 24 mmol/L (ref 22–32)
Calcium: 8.6 mg/dL — ABNORMAL LOW (ref 8.9–10.3)
Chloride: 95 mmol/L — ABNORMAL LOW (ref 98–111)
Creatinine, Ser: 11.08 mg/dL — ABNORMAL HIGH (ref 0.61–1.24)
GFR, Estimated: 5 mL/min — ABNORMAL LOW (ref >60)
Glucose, Bld: 107 mg/dL — ABNORMAL HIGH (ref 70–99)
Potassium: 5.5 mmol/L — ABNORMAL HIGH (ref 3.5–5.1)
Sodium: 133 mmol/L — ABNORMAL LOW (ref 135–145)

## 2022-05-01 LAB — CBC
HCT: 36.8 % — ABNORMAL LOW (ref 39.0–52.0)
Hemoglobin: 11.6 g/dL — ABNORMAL LOW (ref 13.0–17.0)
MCH: 27.8 pg (ref 26.0–34.0)
MCHC: 31.5 g/dL (ref 30.0–36.0)
MCV: 88.2 fL (ref 80.0–100.0)
Platelets: 246 10*3/uL (ref 150–400)
RBC: 4.17 MIL/uL — ABNORMAL LOW (ref 4.22–5.81)
RDW: 15.3 % (ref 11.5–15.5)
WBC: 5 10*3/uL (ref 4.0–10.5)
nRBC: 0 % (ref 0.0–0.2)

## 2022-05-01 LAB — PHOSPHORUS: Phosphorus: 11.6 mg/dL — ABNORMAL HIGH (ref 2.5–4.6)

## 2022-05-01 NOTE — Progress Notes (Signed)
Hd tx of 3.5hrs comleted. 84L total vol processed. No complications. Report given to Bianca browning, rn. Total uf removed: 3022ml Post hd v/s: 97.8 125/87(100) 110 15 98% Post hd wt: 99.1kgs

## 2022-05-01 NOTE — Progress Notes (Signed)
OT Cancellation Note  Patient Details Name: Steve Vance MRN: [id number] DOB: [birth year]   Cancelled Treatment:     Order received for OT evaluation this date, pt currently off the floor this am for dialysis.  PT note early this am indicated pt requested not to get out of bed and did not want to do therapy today.  Will continue attempts as time permits.   Steve Vance, OTR/L, CLT  Steve Vance 05/01/2022, 10:04 AM

## 2022-05-01 NOTE — Progress Notes (Signed)
[facility]  ROUNDING NOTE   Subjective:   Steve Vance is a 61 year old male with past medical conditions including type 2 diabetes, hypertension, dyslipidemia, systolic heart failure with EF 45 to 50% and end-stage renal disease on hemodialysis.  Patient presents to the emergency department after suffering a fall with left hip pain.  Patient has been admitted for ESRD (end stage renal disease) on dialysis ([place]) [N18.6, Z99.2] Closed left hip fracture ([place]) [S72.002A] Closed displaced intertrochanteric fracture of left femur, initial encounter [facility]) [S72.142A]  Patient is known to our practice and receives outpatient dialysis treatments at [facility] on a TTS schedule.   Patient was seen today on dialysis Patient is tolerating treatment well   Objective:  Vital signs in last 24 hours:  Temp:  [98 F (36.7 C)-98.3 F (36.8 C)] 98 F (36.7 C) (08/26 0905) Pulse Rate:  [74-110] 110 (08/26 1200) Resp:  [12-20] 13 (08/26 1200) BP: (117-170)/(58-95) 126/85 (08/26 1200) SpO2:  [96 %-100 %] 96 % (08/26 1200) Weight:  [102.1 kg] 102.1 kg (08/26 0849)  Weight change:  Filed Weights   04/29/22 0148 05/01/22 0849  Weight: 80 kg 102.1 kg    Intake/Output: I/O last 3 completed shifts: In: 680 [P.O.:480; I.V.:100; IV Piggyback:100] Out: 100 [Urine:100]   Intake/Output this shift:  No intake/output data recorded.  Physical Exam: General: Ill-appearing  Head: Normocephalic, atraumatic. Moist oral mucosal membranes  Eyes: Anicteric  Lungs:  Diminished in bases, normal effort, room air  Heart: Regular rate and rhythm  Abdomen:  Soft, nontender, obese  Extremities: None peripheral edema.  Neurologic: Nonfocal, moving all four extremities  Skin: No lesions.  Left hip surgical dressing.  Access: Left aVF    Basic Metabolic Panel: Recent Labs  Lab 04/29/22 0148 04/29/22 0456 05/01/22 0418  NA 135 136 133*  K 5.8* 5.3* 5.5*  CL 96* 102 95*  CO2 21*  20* 24  GLUCOSE 132* 118* 107*  BUN 70* 64* 59*  CREATININE 12.40* 11.04* 11.08*  CALCIUM 8.0* 7.3* 8.6*  PHOS  --   --  11.6*    Liver Function Tests: Recent Labs  Lab 04/29/22 0148  AST 27  ALT 16  ALKPHOS 80  BILITOT 1.0  PROT 8.3*  ALBUMIN 3.3*   No results for input(s): "LIPASE", "AMYLASE" in the last 168 hours. No results for input(s): "AMMONIA" in the last 168 hours.  CBC: Recent Labs  Lab 04/29/22 0148 04/29/22 0456 05/01/22 0418  WBC 8.0 7.3 5.0  HGB 11.5* 12.4* 11.6*  HCT 36.3* 39.3 36.8*  MCV 88.8 89.3 88.2  PLT 238 211 246    Cardiac Enzymes: No results for input(s): "CKTOTAL", "CKMB", "CKMBINDEX", "TROPONINI" in the last 168 hours.  BNP: Invalid input(s): "POCBNP"  CBG: Recent Labs  Lab 04/29/22 1700 04/29/22 1913  GLUCAP 113* 98    Microbiology: Results for orders placed or performed during the hospital encounter of 03/04/22  Blood Culture (routine x 2)     Status: Abnormal   Collection Time: 03/04/22  1:52 PM   Specimen: BLOOD  Result Value Ref Range Status   Specimen Description   Final    BLOOD RAC Performed at [facility], [address] [place], [place] [address]    Special Requests   Final    BOTTLES DRAWN AEROBIC AND ANAEROBIC BCAV Performed at [facility], [address], [place], [place] [address]    Culture  Setup Time   Final    Organism ID to  follow GRAM POSITIVE COCCI IN BOTH AEROBIC AND ANAEROBIC BOTTLES CRITICAL RESULT CALLED TO, READ BACK BY AND VERIFIED WITH: JASON BELEUE $RemoveBefo'[email or web address]$  ON 03/05/22 [facility] GRAM STAIN REVIEWED-AGREE WITH RESULT    Culture (A)  Final    GROUP B STREP(S.AGALACTIAE)ISOLATED STAPHYLOCOCCUS AUREUS CRITICAL RESULT CALLED TO, READ BACK BY AND VERIFIED WITH: B. BIERS PHARMD, AT [id number] 03/07/22 D. VANHOOK Performed at [facility], [place] [address] [place],  [address]    Report Status 03/08/2022 FINAL  Final   Organism ID, Bacteria GROUP B STREP(S.AGALACTIAE)ISOLATED   Final   Organism ID, Bacteria STAPHYLOCOCCUS AUREUS  Final      Susceptibility   Group b strep(s.agalactiae)isolated - MIC*    CLINDAMYCIN <=0.25 SENSITIVE Sensitive     AMPICILLIN <=0.25 SENSITIVE Sensitive     ERYTHROMYCIN <=0.12 SENSITIVE Sensitive     VANCOMYCIN 0.5 SENSITIVE Sensitive     CEFTRIAXONE <=0.12 SENSITIVE Sensitive     LEVOFLOXACIN 1 SENSITIVE Sensitive     PENICILLIN Value in next row Sensitive      SENSITIVE<=0.06    * GROUP B STREP(S.AGALACTIAE)ISOLATED   Staphylococcus aureus - MIC*    CIPROFLOXACIN Value in next row Sensitive      SENSITIVE<=0.06    ERYTHROMYCIN Value in next row Sensitive      SENSITIVE<=0.06    GENTAMICIN Value in next row Sensitive      SENSITIVE<=0.06    OXACILLIN Value in next row Sensitive      SENSITIVE<=0.06    TETRACYCLINE Value in next row Sensitive      SENSITIVE<=0.06    VANCOMYCIN Value in next row Sensitive      SENSITIVE<=0.06    TRIMETH/SULFA Value in next row Sensitive      SENSITIVE<=0.06    CLINDAMYCIN Value in next row Sensitive      SENSITIVE<=0.06    RIFAMPIN Value in next row Sensitive      SENSITIVE<=0.06    Inducible Clindamycin Value in next row Sensitive      SENSITIVE<=0.06    * STAPHYLOCOCCUS AUREUS  Blood Culture ID Panel (Reflexed)     Status: Abnormal   Collection Time: 03/04/22  1:52 PM  Result Value Ref Range Status   Enterococcus faecalis NOT DETECTED NOT DETECTED Final   Enterococcus Faecium NOT DETECTED NOT DETECTED Final   Listeria monocytogenes NOT DETECTED NOT DETECTED Final   Staphylococcus species NOT DETECTED NOT DETECTED Final   Staphylococcus aureus (BCID) NOT DETECTED NOT DETECTED Final   Staphylococcus epidermidis NOT DETECTED NOT DETECTED Final   Staphylococcus lugdunensis NOT DETECTED NOT DETECTED Final   Streptococcus species DETECTED (A) NOT DETECTED Final    Comment: CRITICAL RESULT CALLED TO, READ BACK BY AND VERIFIED WITH: JASON BELEUE $RemoveBefo'[email or web address]$  ON 03/05/22 SKL    Streptococcus  agalactiae DETECTED (A) NOT DETECTED Final    Comment: CRITICAL RESULT CALLED TO, READ BACK BY AND VERIFIED WITH: JASON BELEUE $RemoveBefo'[email or web address]$  ON 03/05/22 SKL    Streptococcus pneumoniae NOT DETECTED NOT DETECTED Final   Streptococcus pyogenes NOT DETECTED NOT DETECTED Final   A.calcoaceticus-baumannii NOT DETECTED NOT DETECTED Final   Bacteroides fragilis NOT DETECTED NOT DETECTED Final   Enterobacterales NOT DETECTED NOT DETECTED Final   Enterobacter cloacae complex NOT DETECTED NOT DETECTED Final   Escherichia coli NOT DETECTED NOT DETECTED Final   Klebsiella aerogenes NOT DETECTED NOT DETECTED Final   Klebsiella oxytoca NOT DETECTED NOT DETECTED Final   Klebsiella pneumoniae NOT DETECTED NOT DETECTED Final   Proteus species NOT DETECTED NOT DETECTED  Final   Salmonella species NOT DETECTED NOT DETECTED Final   Serratia marcescens NOT DETECTED NOT DETECTED Final   Haemophilus influenzae NOT DETECTED NOT DETECTED Final   Neisseria meningitidis NOT DETECTED NOT DETECTED Final   Pseudomonas aeruginosa NOT DETECTED NOT DETECTED Final   Stenotrophomonas maltophilia NOT DETECTED NOT DETECTED Final   Candida albicans NOT DETECTED NOT DETECTED Final   Candida auris NOT DETECTED NOT DETECTED Final   Candida glabrata NOT DETECTED NOT DETECTED Final   Candida krusei NOT DETECTED NOT DETECTED Final   Candida parapsilosis NOT DETECTED NOT DETECTED Final   Candida tropicalis NOT DETECTED NOT DETECTED Final   Cryptococcus neoformans/gattii NOT DETECTED NOT DETECTED Final    Comment: Performed at [facility], [address] [place], [place] [address]  Blood Culture (routine x 2)     Status: Abnormal   Collection Time: 03/04/22  1:57 PM   Specimen: BLOOD  Result Value Ref Range Status   Specimen Description BLOOD BLOOD RIGHT FOREARM  Final   Special Requests   Final    BOTTLES DRAWN AEROBIC AND ANAEROBIC Blood Culture results may not be optimal due to an inadequate volume of blood received in  culture bottles   Culture  Setup Time   Final    GRAM POSITIVE COCCI IN BOTH AEROBIC AND ANAEROBIC BOTTLES CRITICAL VALUE NOTED.  VALUE IS CONSISTENT WITH PREVIOUSLY REPORTED AND CALLED VALUE.    Culture (A)  Final    GROUP B STREP(S.AGALACTIAE)ISOLATED STAPHYLOCOCCUS AUREUS CRITICAL RESULT CALLED TO, READ BACK BY AND VERIFIED WITH: B. BIERS PHARMD, AT 3267 03/07/22 BY D. VANHOOK SUSCEPTIBILITIES PERFORMED ON PREVIOUS CULTURE WITHIN THE LAST 5 DAYS. Performed at [facility], [place] [address] [place],  [address]    Report Status 03/08/2022 FINAL  Final  Resp Panel by RT-PCR (Flu A&B, Covid) Anterior Nasal Swab     Status: None   Collection Time: 03/04/22  2:12 PM   Specimen: Anterior Nasal Swab  Result Value Ref Range Status   SARS Coronavirus 2 by RT PCR NEGATIVE NEGATIVE Final    Comment: (NOTE) SARS-CoV-2 target nucleic acids are NOT DETECTED.  The SARS-CoV-2 RNA is generally detectable in upper respiratory specimens during the acute phase of infection. The lowest concentration of SARS-CoV-2 viral copies this assay can detect is 138 copies/mL. A negative result does not preclude SARS-Cov-2 infection and should not be used as the sole basis for treatment or other patient management decisions. A negative result may occur with  improper specimen collection/handling, submission of specimen other than nasopharyngeal swab, presence of viral mutation(s) within the areas targeted by this assay, and inadequate number of viral copies(<138 copies/mL). A negative result must be combined with clinical observations, patient history, and epidemiological information. The expected result is Negative.  Fact Sheet for Patients:  EntrepreneurPulse.com.au  Fact Sheet for Healthcare Providers:  IncredibleEmployment.be  This test is no t yet approved or cleared by the [place] FDA and  has been authorized for detection and/or diagnosis of  SARS-CoV-2 by FDA under an Emergency Use Authorization (EUA). This EUA will remain  in effect (meaning this test can be used) for the duration of the COVID-19 declaration under Section 564(b)(1) of the Act, 21 U.S.C.section 360bbb-3(b)(1), unless the authorization is terminated  or revoked sooner.       Influenza A by PCR NEGATIVE NEGATIVE Final   Influenza B by PCR NEGATIVE NEGATIVE Final    Comment: (NOTE) The Xpert Xpress SARS-CoV-2/FLU/RSV plus assay is intended  as an aid in the diagnosis of influenza from Nasopharyngeal swab specimens and should not be used as a sole basis for treatment. Nasal washings and aspirates are unacceptable for Xpert Xpress SARS-CoV-2/FLU/RSV testing.  Fact Sheet for Patients: EntrepreneurPulse.com.au  Fact Sheet for Healthcare Providers: IncredibleEmployment.be  This test is not yet approved or cleared by the [place] FDA and has been authorized for detection and/or diagnosis of SARS-CoV-2 by FDA under an Emergency Use Authorization (EUA). This EUA will remain in effect (meaning this test can be used) for the duration of the COVID-19 declaration under Section 564(b)(1) of the Act, 21 U.S.C. section 360bbb-3(b)(1), unless the authorization is terminated or revoked.  Performed at [facility], [place], [place] [address]   C Difficile Quick Screen w PCR reflex     Status: None   Collection Time: 03/04/22  9:21 PM   Specimen: Stool  Result Value Ref Range Status   C Diff antigen NEGATIVE NEGATIVE Final   C Diff toxin NEGATIVE NEGATIVE Final   C Diff interpretation No C. difficile detected.  Final    Comment: Performed at [facility], [address] [place], [place] [address]  Gastrointestinal Panel by PCR , Stool     Status: None   Collection Time: 03/04/22  9:21 PM   Specimen: Stool  Result Value Ref Range Status   Campylobacter species NOT DETECTED NOT DETECTED  Final   Plesimonas shigelloides NOT DETECTED NOT DETECTED Final   Salmonella species NOT DETECTED NOT DETECTED Final   Yersinia enterocolitica NOT DETECTED NOT DETECTED Final   Vibrio species NOT DETECTED NOT DETECTED Final   Vibrio cholerae NOT DETECTED NOT DETECTED Final   Enteroaggregative E coli (EAEC) NOT DETECTED NOT DETECTED Final   Enteropathogenic E coli (EPEC) NOT DETECTED NOT DETECTED Final   Enterotoxigenic E coli (ETEC) NOT DETECTED NOT DETECTED Final   Shiga like toxin producing E coli (STEC) NOT DETECTED NOT DETECTED Final   Shigella/Enteroinvasive E coli (EIEC) NOT DETECTED NOT DETECTED Final   Cryptosporidium NOT DETECTED NOT DETECTED Final   Cyclospora cayetanensis NOT DETECTED NOT DETECTED Final   Entamoeba histolytica NOT DETECTED NOT DETECTED Final   Giardia lamblia NOT DETECTED NOT DETECTED Final   Adenovirus F40/41 NOT DETECTED NOT DETECTED Final   Astrovirus NOT DETECTED NOT DETECTED Final   Norovirus GI/GII NOT DETECTED NOT DETECTED Final   Rotavirus A NOT DETECTED NOT DETECTED Final   Sapovirus (I, II, IV, and V) NOT DETECTED NOT DETECTED Final    Comment: Performed at [facility], [address] [place], [place] [address]  Urine Culture     Status: Abnormal   Collection Time: 03/04/22  9:39 PM   Specimen: In/Out Cath Urine  Result Value Ref Range Status   Specimen Description   Final    IN/OUT CATH URINE Performed at [facility], [address], [place], [place] [address]    Special Requests   Final    NONE Performed at [facility], [address] [place], [place] [address]    Culture MULTIPLE SPECIES PRESENT, SUGGEST RECOLLECTION (A)  Final   Report Status 03/07/2022 FINAL  Final  Culture, blood (Routine X 2) w Reflex to ID Panel     Status: None   Collection Time: 03/06/22  4:50 AM   Specimen: BLOOD  Result Value Ref Range Status   Specimen Description BLOOD RIGHT ANTECUBITAL  Final   Special Requests   Final  BOTTLES DRAWN AEROBIC AND ANAEROBIC Blood Culture adequate volume   Culture   Final    NO GROWTH 5 DAYS Performed at [facility], [address] [place] [place], [place] [address]    Report Status 03/11/2022 FINAL  Final  Culture, blood (Routine X 2) w Reflex to ID Panel     Status: None   Collection Time: 03/06/22  4:50 AM   Specimen: BLOOD  Result Value Ref Range Status   Specimen Description BLOOD BLOOD RIGHT WRIST  Final   Special Requests   Final    BOTTLES DRAWN AEROBIC AND ANAEROBIC Blood Culture adequate volume   Culture   Final    NO GROWTH 5 DAYS Performed at [facility], [address] [place], [place] [address]    Report Status 03/11/2022 FINAL  Final    Coagulation Studies: Recent Labs    04/29/22 0148  LABPROT 14.5  INR 1.1    Urinalysis: No results for input(s): "COLORURINE", "LABSPEC", "PHURINE", "GLUCOSEU", "HGBUR", "BILIRUBINUR", "KETONESUR", "PROTEINUR", "UROBILINOGEN", "NITRITE", "LEUKOCYTESUR" in the last 72 hours.  Invalid input(s): "APPERANCEUR"    Imaging: DG HIP UNILAT WITH PELVIS 2-3 VIEWS LEFT  Result Date: 04/29/2022 CLINICAL DATA:  Left hip pinning EXAM: DG HIP (WITH OR WITHOUT PELVIS) 2-3V LEFT COMPARISON:  04/29/2022 FINDINGS: Intraoperative imaging demonstrates internal fixation across the left intertrochanteric fracture. Anatomic alignment. No hardware complicating feature. IMPRESSION: Internal fixation.  No visible complicating feature. Electronically Signed   By: Charlett Nose M.D.   On: 04/29/2022 19:12   DG C-Arm 1-60 Min-No Report  Result Date: 04/29/2022 Fluoroscopy was utilized by the requesting physician.  No radiographic interpretation.     Medications:    anticoagulant sodium citrate      (feeding supplement) PROSource Plus  30 mL Oral TID BM   amLODipine  10 mg Oral Daily   aspirin EC  325 mg Oral BID   atorvastatin  10 mg Oral Daily   calcium acetate  667 mg Oral TID WC   carvedilol  12.5 mg Oral BID    carvedilol  6.25 mg Oral BID   Chlorhexidine Gluconate Cloth  6 each Topical Q0600   docusate sodium  100 mg Oral BID   famotidine  20 mg Oral Daily   feeding supplement (NEPRO CARB STEADY)  237 mL Oral BID BM   heparin injection (subcutaneous)  5,000 Units Subcutaneous Q8H   isosorbide mononitrate  30 mg Oral Daily   lactulose  20 g Oral BID   lisinopril  40 mg Oral Daily   multivitamin  1 tablet Oral QHS   acetaminophen, albuterol, alteplase, anticoagulant sodium citrate, bisacodyl, heparin, hydrALAZINE, HYDROcodone-acetaminophen, lidocaine (PF), lidocaine-prilocaine, magnesium hydroxide, menthol-cetylpyridinium **OR** phenol, metoCLOPramide **OR** metoCLOPramide (REGLAN) injection, morphine injection, ondansetron **OR** ondansetron (ZOFRAN) IV, pentafluoroprop-tetrafluoroeth, traZODone  Assessment/ Plan:  Steve Vance is a 61 y.o.  male past medical conditions including type 2 diabetes, hypertension, dyslipidemia, systolic heart failure with EF 45 to 50% and end-stage renal disease on hemodialysis.  Patient presents to the emergency department after suffering a fall with left hip pain.  Patient has been admitted for ESRD (end stage renal disease) on dialysis ([facility]) [N18.6, Z99.2] Closed left hip fracture ([facility]) [S72.002A] Closed displaced intertrochanteric fracture of left femur, initial encounter ([facility]) [S72.142A]  CC [facility]/TTS/left aVF  End-stage renal disease with hyperkalemia on hemodialysis.  Will maintain outpatient schedule if possible.  Patient received scheduled dialysis yesterday, UF 2.5 L achieved.  Next treatment scheduled for Saturday.  Renal navigator monitoring discharge plan which will likely include rehab placement.  2. Anemia of chronic kidney disease Lab Results  Component Value Date   HGB 11.6 (L) 05/01/2022    Patient receives [place] outpatient.  Hemoglobin within desired target.  3. Diabetes mellitus type II with chronic kidney  disease/renal manifestations: noninsulin dependent.  Most recent hemoglobin A1c is 5.6 on 04/29/22.   Glucose well controlled.  4.  Hypertension with chronic kidney disease.  Home regimen includes amlodipine, carvedilol, isosorbide, and lisinopril.Lisinopril currently held during admission.  Blood pressure163/85.  5. Secondary Hyperparathyroidism: with outpatient labs: PTH 759, phosphorus 11.5, calcium 8.6 on 04/22/22.   Lab Results  Component Value Date   PTH 301 (H) 09/24/2020   CALCIUM 8.6 (L) 05/01/2022   CAION 1.21 06/27/2019   PHOS 11.6 (H) 05/01/2022  Calcium acetate ordered with meals outpatient.  Calcium below target.  Continue calcium acetate.     6.Hyperkalemia Patient is being dialyzed with a lower K bath that should help     LOS: 2 Braylan Faul s Theador Hawthorne 8/26/202312:45 PM

## 2022-05-01 NOTE — Progress Notes (Signed)
Physical Therapy Treatment Patient Details Name: Steve Vance MRN: [id number] DOB: 26-Aug-1961 Today's Date: 05/01/2022   History of Present Illness presented to ER secondary to mechanical fall in home environment with acute onset of L hip pain; admitted for manaegment of L hip fracture, s/p IM nailing (04/29/22), WBAT.    PT Comments    Pt was long sitting in bed upon arriving. He is A and O x 4. Chart laying I bed for transport to take down for HD. Pt was unwilling to attempt OOB to HD chair." I'm just going stay in bed today." Endorses 8/10 pain and need to have BM. Author offer to transfer to [facility] however pt request to use bed pan. Max assist to roll R for bed pan placement and reposition. Pt is severely pain limited. Author did issue HEP handout with education on importance of exercising even when in bed. Pt states understanding however unwilling to perform. PT continues to recommend DC to SNF to address deficits while maximizing independence. Author offer to return later in day but pt requested to rest after HD.    Recommendations for follow up therapy are one component of a multi-disciplinary discharge planning process, led by the attending physician.  Recommendations may be updated based on patient status, additional functional criteria and insurance authorization.  Follow Up Recommendations  Skilled nursing-short term rehab (<3 hours/day)     Assistance Recommended at Discharge Frequent or constant Supervision/Assistance  Patient can return home with the following Two people to help with walking and/or transfers;Two people to help with bathing/dressing/bathroom;Assistance with cooking/housework;Assist for transportation;Help with stairs or ramp for entrance   Equipment Recommendations  Other (comment) (defer to next level of care)       Precautions / Restrictions Precautions Precautions: Fall Precaution Comments: No BP L UE Restrictions Weight Bearing Restrictions: Yes LLE  Weight Bearing: Weight bearing as tolerated     Mobility  Bed Mobility Overal bed mobility: Needs Assistance Bed Mobility: Rolling Rolling: Max assist      General bed mobility comments: Pt was unwilling to attempt EOB/OOB activity. will be going to HD. Pt needed to have a BM and required max assist to roll R for bed pan placement. While on bedpan, author issued Pt HEP handout with education on importance of performance and need to move even though it hurts. Pt states understanding but did not perform exercises. pain and self limiting    Transfers    General transfer comment: Unwilling to attempt tranfer to HD chair prior to leaving floor. "I'm going to just lay down today."      Balance Overall balance assessment:  (unable to assess)       Cognition Arousal/Alertness: Awake/alert Behavior During Therapy: WFL for tasks assessed/performed Overall Cognitive Status: Within Functional Limits for tasks assessed      General Comments: Pt is A and O x 4. pain limited. HD limited session               Pertinent Vitals/Pain Pain Assessment Pain Assessment: 0-10 Pain Score: 8  Faces Pain Scale: Hurts whole lot Pain Location: L hip Pain Descriptors / Indicators: Aching, Grimacing, Guarding Pain Intervention(s): Limited activity within patient's tolerance, Monitored during session, Repositioned, Ice applied     PT Goals (current goals can now be found in the care plan section) Acute Rehab PT Goals Patient Stated Goal: have less pain Progress towards PT goals: Not progressing toward goals - comment (limited by pain/HD)    Frequency    7X/week  PT Plan Current plan remains appropriate       AM-PAC PT "6 Clicks" Mobility   Outcome Measure  Help needed turning from your back to your side while in a flat bed without using bedrails?: A Lot Help needed moving from lying on your back to sitting on the side of a flat bed without using bedrails?: Total Help needed  moving to and from a bed to a chair (including a wheelchair)?: Total Help needed standing up from a chair using your arms (e.g., wheelchair or bedside chair)?: Total Help needed to walk in hospital room?: Total Help needed climbing 3-5 steps with a railing? : Total 6 Click Score: 7    End of Session   Activity Tolerance: Patient limited by pain;Other (comment) (limited sessio with HD/need for BM) Patient left: in bed;with call bell/phone within reach;with bed alarm set;Other (comment) (transport for HD) Nurse Communication: Mobility status PT Visit Diagnosis: Muscle weakness (generalized) (M62.81);Difficulty in walking, not elsewhere classified (R26.2);Pain Pain - Right/Left: Left Pain - part of body: Hip     Time: 5638-7564 PT Time Calculation (min) (ACUTE ONLY): 12 min  Charges:  $Therapeutic Activity: 8-22 mins          Julaine Fusi PTA 05/01/22, 8:29 AM

## 2022-05-01 NOTE — Progress Notes (Signed)
[facility] at [place] NAME: Steve Vance    MR#:  [id number]  DATE OF BIRTH:  11-Sep-1960  SUBJECTIVE:  left hip pain. Tolerating PO diet. No family at bedside. Got HD today   VITALS:  Blood pressure 125/87, pulse (!) 110, temperature 97.8 F (36.6 C), temperature source Oral, resp. rate 15, height $RemoveBe'5\' 7"'eoVVqyhPg$  (1.702 m), weight 99.1 kg, SpO2 98 %.  PHYSICAL EXAMINATION:   GENERAL:  61 y.o.-year-old patient lying in the bed with no acute distress. Looks older than stated age LUNGS: Normal breath sounds bilaterally, no wheezing, rales, rhonchi.  CARDIOVASCULAR: S1, S2 normal. No murmurs, rubs, or gallops.  ABDOMEN: Soft, nontender, nondistended. Bowel sounds present.  EXTREMITIES: No  edema b/l.   HD access NEUROLOGIC: nonfocal  patient is alert and awake SKIN: No obvious rash, lesion, or ulcer.   LABORATORY PANEL:  CBC Recent Labs  Lab 05/01/22 0418  WBC 5.0  HGB 11.6*  HCT 36.8*  PLT 246     Chemistries  Recent Labs  Lab 04/29/22 0148 04/29/22 0456 05/01/22 0418  NA 135   < > 133*  K 5.8*   < > 5.5*  CL 96*   < > 95*  CO2 21*   < > 24  GLUCOSE 132*   < > 107*  BUN 70*   < > 59*  CREATININE 12.40*   < > 11.08*  CALCIUM 8.0*   < > 8.6*  AST 27  --   --   ALT 16  --   --   ALKPHOS 80  --   --   BILITOT 1.0  --   --    < > = values in this interval not displayed.    Cardiac Enzymes No results for input(s): "TROPONINI" in the last 168 hours. RADIOLOGY:  DG HIP UNILAT WITH PELVIS 2-3 VIEWS LEFT  Result Date: 04/29/2022 CLINICAL DATA:  Left hip pinning EXAM: DG HIP (WITH OR WITHOUT PELVIS) 2-3V LEFT COMPARISON:  04/29/2022 FINDINGS: Intraoperative imaging demonstrates internal fixation across the left intertrochanteric fracture. Anatomic alignment. No hardware complicating feature. IMPRESSION: Internal fixation.  No visible complicating feature. Electronically Signed   By: Rolm Baptise M.D.   On: 04/29/2022 19:12   DG  C-Arm 1-60 Min-No Report  Result Date: 04/29/2022 Fluoroscopy was utilized by the requesting physician.  No radiographic interpretation.    Assessment and Plan  Steve Vance is a 61 y.o. African-American male male with medical history significant for end-stage renal disease on hemodialysis on TTS, systolic CHF with EF of 45 to 50%, type 2 diabetes mellitus, hypertension and dyslipidemia, who presented to the ER with acute onset of accidental mechanical fall with subsequent left hip pain.  The patient fell a couple of steps as his foot gave way per his report.  Closed left hip fracture (HCC) mechanical fall -- patient is status post surgery by Dr. Sharlet Salina -start PT OT- recommends rehab --PRN pain meds -- TOC for discharge planning    ESRD on dialysis [facility]) - Nephrology consult with Dr. Candiss Norse  -- patient was dialyzed before surgery and will be placed on his routine dialysis schedule  Type 2 diabetes mellitus with hyperlipidemia ([place]) - continue SSI, not on any po meds for DM at home - \continue statin therapy.   Essential hypertension -- resume home meds   GERD without esophagitis - continue H2 blocker therapy.  PT OT to see patient. TOC for discharge planning   Procedures:  left hip surgery Family communication : none Consults : orthopedic CODE STATUS: full DVT Prophylaxis : heparin Level of care: Med-Surg Status is: Inpatient Remains inpatient appropriate because: postop day 2 hip surgery.  Per TOC--[facility]    TOTAL TIME TAKING CARE OF THIS PATIENT: 35 minutes.  >50% time spent on counselling and coordination of care  Note: This dictation was prepared with Dragon dictation along with smaller phrase technology. Any transcriptional errors that result from this process are unintentional.  Fritzi Mandes M.D    [facility]   CC: Primary care physician; Alene Mires, Elyse Jarvis, MD
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
## 2022-05-02 DIAGNOSIS — S72002A Fracture of unspecified part of neck of left femur, initial encounter for closed fracture: Secondary | ICD-10-CM | POA: Diagnosis not present

## 2022-05-02 LAB — CBC
HCT: 36.4 % — ABNORMAL LOW (ref 39.0–52.0)
Hemoglobin: 11.6 g/dL — ABNORMAL LOW (ref 13.0–17.0)
MCH: 28.6 pg (ref 26.0–34.0)
MCHC: 31.9 g/dL (ref 30.0–36.0)
MCV: 89.9 fL (ref 80.0–100.0)
Platelets: 243 10*3/uL (ref 150–400)
RBC: 4.05 MIL/uL — ABNORMAL LOW (ref 4.22–5.81)
RDW: 15 % (ref 11.5–15.5)
WBC: 5.6 10*3/uL (ref 4.0–10.5)
nRBC: 0 % (ref 0.0–0.2)

## 2022-05-02 LAB — BASIC METABOLIC PANEL
Anion gap: 12 (ref 5–15)
BUN: 46 mg/dL — ABNORMAL HIGH (ref 8–23)
CO2: 28 mmol/L (ref 22–32)
Calcium: 8.8 mg/dL — ABNORMAL LOW (ref 8.9–10.3)
Chloride: 94 mmol/L — ABNORMAL LOW (ref 98–111)
Creatinine, Ser: 9.03 mg/dL — ABNORMAL HIGH (ref 0.61–1.24)
GFR, Estimated: 6 mL/min — ABNORMAL LOW (ref >60)
Glucose, Bld: 93 mg/dL (ref 70–99)
Potassium: 5.1 mmol/L (ref 3.5–5.1)
Sodium: 134 mmol/L — ABNORMAL LOW (ref 135–145)

## 2022-05-02 NOTE — Progress Notes (Signed)
[place] at [place] NAME: Steve Vance    MR#:  [id number]  DATE OF BIRTH:  [birth year]  SUBJECTIVE:  No new complaints   VITALS:  Blood pressure (!) 161/76, pulse 73, temperature 98.2 F (36.8 C), resp. rate 16, height $RemoveBe'5\' 7"'hOLQksISC$  (1.702 m), weight 99.1 kg, SpO2 97 %.  PHYSICAL EXAMINATION:   GENERAL:  61 y.o.-year-old patient lying in the bed with no acute distress. Looks older than stated age LUNGS: Normal breath sounds bilaterally, no wheezing, rales, rhonchi.  CARDIOVASCULAR: S1, S2 normal. No murmurs, rubs, or gallops.  ABDOMEN: Soft, nontender, nondistended. Bowel sounds present.  EXTREMITIES: No  edema b/l.   HD access NEUROLOGIC: nonfocal  patient is alert and awake SKIN: No obvious rash, lesion, or ulcer.   LABORATORY PANEL:  CBC Recent Labs  Lab 05/02/22 0630  WBC 5.6  HGB 11.6*  HCT 36.4*  PLT 243     Chemistries  Recent Labs  Lab 04/29/22 0148 04/29/22 0456 05/02/22 0630  NA 135   < > 134*  K 5.8*   < > 5.1  CL 96*   < > 94*  CO2 21*   < > 28  GLUCOSE 132*   < > 93  BUN 70*   < > 46*  CREATININE 12.40*   < > 9.03*  CALCIUM 8.0*   < > 8.8*  AST 27  --   --   ALT 16  --   --   ALKPHOS 80  --   --   BILITOT 1.0  --   --    < > = values in this interval not displayed.    Cardiac Enzymes No results for input(s): "TROPONINI" in the last 168 hours. RADIOLOGY:  No results found.  Assessment and Plan  Steve Vance is a 61 y.o. African-American male male with medical history significant for end-stage renal disease on hemodialysis on TTS, systolic CHF with EF of 45 to 50%, type 2 diabetes mellitus, hypertension and dyslipidemia, who presented to the ER with acute onset of accidental mechanical fall with subsequent left hip pain.  The patient fell a couple of steps as his foot gave way per his report.  Closed left hip fracture (HCC) mechanical fall -- patient is status post surgery by Dr. Sharlet Salina -start PT  OT- recommends rehab --PRN pain meds -- TOC for discharge planning    ESRD on dialysis [facility]) - Nephrology consult with Dr. Candiss Norse  -- patient was dialyzed before surgery and will be placed on his routine dialysis schedule  Type 2 diabetes mellitus with hyperlipidemia ([place]) - continue SSI, not on any po meds for DM at home - \continue statin therapy.   Essential hypertension -- resume home meds   GERD without esophagitis - continue H2 blocker therapy.    Procedures: left hip surgery Family communication : none Consults : orthopedic CODE STATUS: full DVT Prophylaxis : heparin Level of care: Med-Surg Status is: Inpatient Remains inpatient appropriate because: postop day 2 hip surgery.  Barrier to Discharge Per TOC--[facility]    TOTAL TIME TAKING CARE OF THIS PATIENT: 35 minutes.  >50% time spent on counselling and coordination of care  Note: This dictation was prepared with Dragon dictation along with smaller phrase technology. Any transcriptional errors that result from this process are unintentional.  Fritzi Mandes M.D    [facility]   CC: Primary care physician;  [place], Elyse Jarvis, MD

## 2022-05-02 NOTE — Progress Notes (Addendum)
Physical Therapy Treatment Patient Details Name: Steve Vance MRN: [id number] DOB: Jan 06, 1961 Today's Date: 05/02/2022   History of Present Illness presented to ER secondary to mechanical fall in home environment with acute onset of L hip pain; admitted for manaegment of L hip fracture, s/p IM nailing (04/29/22), WBAT.    PT Comments    Significant increase in motivation today. Self initiates to EOB with min a x 2 to complete.  Steady in sitting.  He is able to stand from elevated bed with min a x 1.  Generally unsteady in standing needing +2 assist for safety but much less physical assist from prior sessions.  Weight shifting left/right and mini- steps in place.  He is able to take several steps to recliner and remains standing for several minutes with +1 assist.  Sits when fatigued for BLE A/AAROM in sitting x 10,  Remains up with needs met.  Pt does demonstrate decreased safety during session which increases fall risk.  For example took hand off walker and tried to step  "I forgot"   Recommendations for follow up therapy are one component of a multi-disciplinary discharge planning process, led by the attending physician.  Recommendations may be updated based on patient status, additional functional criteria and insurance authorization.  Follow Up Recommendations  Skilled nursing-short term rehab (<3 hours/day)     Assistance Recommended at Discharge Frequent or constant Supervision/Assistance  Patient can return home with the following Two people to help with walking and/or transfers;Two people to help with bathing/dressing/bathroom;Assistance with cooking/housework;Assist for transportation;Help with stairs or ramp for entrance   Equipment Recommendations       Recommendations for Other Services       Precautions / Restrictions Precautions Precautions: Fall Precaution Comments: No BP L UE Restrictions Weight Bearing Restrictions: Yes LLE Weight Bearing: Weight bearing as  tolerated     Mobility  Bed Mobility Overal bed mobility: Needs Assistance Bed Mobility: Supine to Sit     Supine to sit: Min assist, +2 for physical assistance     General bed mobility comments: significant improvement in ability and motivation - decreased pain today    Transfers Overall transfer level: Needs assistance Equipment used: Rolling walker (2 wheels) Transfers: Sit to/from Stand, Bed to chair/wheelchair/BSC Sit to Stand: Min assist, +2 physical assistance   Step pivot transfers: Min assist, +2 physical assistance       General transfer comment: much better today.  remains +2 for safety but siginificant improvement    Ambulation/Gait Ambulation/Gait assistance: Min assist, +2 physical assistance Gait Distance (Feet): 3 Feet Assistive device: Rolling walker (2 wheels) Gait Pattern/deviations: Step-to pattern Gait velocity: decreased     General Gait Details: takes fait steps to chair today   Stairs             Wheelchair Mobility    Modified Rankin (Stroke Patients Only)       Balance Overall balance assessment: Needs assistance Sitting-balance support: No upper extremity supported, Feet supported Sitting balance-Leahy Scale: Fair     Standing balance support: Bilateral upper extremity supported Standing balance-Leahy Scale: Poor                              Cognition Arousal/Alertness: Awake/alert Behavior During Therapy: WFL for tasks assessed/performed Overall Cognitive Status: Within Functional Limits for tasks assessed  Exercises Other Exercises Other Exercises: seated AROM x 10 AAROM for LAQ    General Comments        Pertinent Vitals/Pain Pain Assessment Pain Assessment: Faces Faces Pain Scale: Hurts little more Pain Location: L hip Pain Descriptors / Indicators: Aching, Grimacing, Guarding Pain Intervention(s): Limited activity within patient's  tolerance, Monitored during session, Premedicated before session, Repositioned    Home Living                          Prior Function            PT Goals (current goals can now be found in the care plan section) Progress towards PT goals: Progressing toward goals    Frequency           PT Plan Current plan remains appropriate    Co-evaluation              AM-PAC PT "6 Clicks" Mobility   Outcome Measure  Help needed turning from your back to your side while in a flat bed without using bedrails?: A Little Help needed moving from lying on your back to sitting on the side of a flat bed without using bedrails?: A Lot Help needed moving to and from a bed to a chair (including a wheelchair)?: A Lot Help needed standing up from a chair using your arms (e.g., wheelchair or bedside chair)?: A Lot Help needed to walk in hospital room?: A Lot Help needed climbing 3-5 steps with a railing? : Total 6 Click Score: 12    End of Session Equipment Utilized During Treatment: Gait belt Activity Tolerance: Patient tolerated treatment well Patient left: in chair;with call bell/phone within reach;with chair alarm set Nurse Communication: Mobility status PT Visit Diagnosis: Muscle weakness (generalized) (M62.81);Difficulty in walking, not elsewhere classified (R26.2);Pain Pain - Right/Left: Left Pain - part of body: Hip     Time: 4492-0100 PT Time Calculation (min) (ACUTE ONLY): 19 min  Charges:  $Therapeutic Activity: 8-22 mins                   Chesley Noon, PTA 05/02/22, 11:00 AM

## 2022-05-02 NOTE — Plan of Care (Signed)

## 2022-05-02 NOTE — Progress Notes (Signed)
[facility]  ROUNDING NOTE   Subjective:   Steve Vance is a 61 year old male with past medical conditions including type 2 diabetes, hypertension, dyslipidemia, systolic heart failure with EF 45 to 50% and end-stage renal disease on hemodialysis.  Patient presents to the emergency department after suffering a fall with left hip pain.  Patient has been admitted for ESRD (end stage renal disease) on dialysis ([place]) [N18.6, Z99.2] Closed left hip fracture ([place]) [S72.002A] Closed displaced intertrochanteric fracture of left femur, initial encounter [facility]) [S72.142A]  Patient is known to our practice and receives outpatient dialysis treatments at [facility] on a TTS schedule.   Patient was seen today on the first floor Patient resting comfortably in the bed Patient offers no new physical complaints   Objective:  Vital signs in last 24 hours:  Temp:  [97.8 F (36.6 C)-98.5 F (36.9 C)] 98.2 F (36.8 C) (08/27 0807) Pulse Rate:  [73-113] 73 (08/27 0807) Resp:  [12-18] 16 (08/27 0807) BP: (125-170)/(69-89) 161/76 (08/27 0807) SpO2:  [95 %-100 %] 97 % (08/27 0807) Weight:  [99.1 kg] 99.1 kg (08/26 1259)  Weight change:  Filed Weights   04/29/22 0148 05/01/22 0849 05/01/22 1259  Weight: 80 kg 102.1 kg 99.1 kg    Intake/Output: I/O last 3 completed shifts: In: 240 [P.O.:240] Out: 3000 [Other:3000]   Intake/Output this shift:  No intake/output data recorded.  Physical Exam: General: Ill-appearing  Head: Normocephalic, atraumatic. Moist oral mucosal membranes  Eyes: Anicteric  Lungs:  Diminished in bases, normal effort, room air  Heart: Regular rate and rhythm  Abdomen:  Soft, nontender, obese  Extremities: None peripheral edema.  Neurologic: Nonfocal, moving all four extremities  Skin: No lesions.  Left hip surgical dressing.  Access: Left aVF    Basic Metabolic Panel: Recent Labs  Lab 04/29/22 0148 04/29/22 0456 05/01/22 0418 05/02/22 0630   NA 135 136 133* 134*  K 5.8* 5.3* 5.5* 5.1  CL 96* 102 95* 94*  CO2 21* 20* 24 28  GLUCOSE 132* 118* 107* 93  BUN 70* 64* 59* 46*  CREATININE 12.40* 11.04* 11.08* 9.03*  CALCIUM 8.0* 7.3* 8.6* 8.8*  PHOS  --   --  11.6*  --     Liver Function Tests: Recent Labs  Lab 04/29/22 0148  AST 27  ALT 16  ALKPHOS 80  BILITOT 1.0  PROT 8.3*  ALBUMIN 3.3*   No results for input(s): "LIPASE", "AMYLASE" in the last 168 hours. No results for input(s): "AMMONIA" in the last 168 hours.  CBC: Recent Labs  Lab 04/29/22 0148 04/29/22 0456 05/01/22 0418 05/02/22 0630  WBC 8.0 7.3 5.0 5.6  HGB 11.5* 12.4* 11.6* 11.6*  HCT 36.3* 39.3 36.8* 36.4*  MCV 88.8 89.3 88.2 89.9  PLT 238 211 246 243    Cardiac Enzymes: No results for input(s): "CKTOTAL", "CKMB", "CKMBINDEX", "TROPONINI" in the last 168 hours.  BNP: Invalid input(s): "POCBNP"  CBG: Recent Labs  Lab 04/29/22 1700 04/29/22 1913  GLUCAP 113* 98    Microbiology: Results for orders placed or performed during the hospital encounter of 03/04/22  Blood Culture (routine x 2)     Status: Abnormal   Collection Time: 03/04/22  1:52 PM   Specimen: BLOOD  Result Value Ref Range Status   Specimen Description   Final    BLOOD RAC Performed at [facility], [address] [place],  [address]    Special Requests   Final    BOTTLES DRAWN AEROBIC AND  ANAEROBIC BCAV Performed at [facility], [address] [place], [place] [address]    Culture  Setup Time   Final    Organism ID to follow GRAM POSITIVE COCCI IN BOTH AEROBIC AND ANAEROBIC BOTTLES CRITICAL RESULT CALLED TO, READ BACK BY AND VERIFIED WITH: JASON BELEUE $RemoveBefo'[email or web address]$  ON 03/05/22 SKL GRAM STAIN REVIEWED-AGREE WITH RESULT    Culture (A)  Final    GROUP B STREP(S.AGALACTIAE)ISOLATED STAPHYLOCOCCUS AUREUS CRITICAL RESULT CALLED TO, READ BACK BY AND VERIFIED WITH: B. BIERS PHARMD, AT [id number] 03/07/22 D. VANHOOK Performed at [facility],  [place] [address], [place], [place] [address]    Report Status 03/08/2022 FINAL  Final   Organism ID, Bacteria GROUP B STREP(S.AGALACTIAE)ISOLATED  Final   Organism ID, Bacteria STAPHYLOCOCCUS AUREUS  Final      Susceptibility   Group b strep(s.agalactiae)isolated - MIC*    CLINDAMYCIN <=0.25 SENSITIVE Sensitive     AMPICILLIN <=0.25 SENSITIVE Sensitive     ERYTHROMYCIN <=0.12 SENSITIVE Sensitive     VANCOMYCIN 0.5 SENSITIVE Sensitive     CEFTRIAXONE <=0.12 SENSITIVE Sensitive     LEVOFLOXACIN 1 SENSITIVE Sensitive     PENICILLIN Value in next row Sensitive      SENSITIVE<=0.06    * GROUP B STREP(S.AGALACTIAE)ISOLATED   Staphylococcus aureus - MIC*    CIPROFLOXACIN Value in next row Sensitive      SENSITIVE<=0.06    ERYTHROMYCIN Value in next row Sensitive      SENSITIVE<=0.06    GENTAMICIN Value in next row Sensitive      SENSITIVE<=0.06    OXACILLIN Value in next row Sensitive      SENSITIVE<=0.06    TETRACYCLINE Value in next row Sensitive      SENSITIVE<=0.06    VANCOMYCIN Value in next row Sensitive      SENSITIVE<=0.06    TRIMETH/SULFA Value in next row Sensitive      SENSITIVE<=0.06    CLINDAMYCIN Value in next row Sensitive      SENSITIVE<=0.06    RIFAMPIN Value in next row Sensitive      SENSITIVE<=0.06    Inducible Clindamycin Value in next row Sensitive      SENSITIVE<=0.06    * STAPHYLOCOCCUS AUREUS  Blood Culture ID Panel (Reflexed)     Status: Abnormal   Collection Time: 03/04/22  1:52 PM  Result Value Ref Range Status   Enterococcus faecalis NOT DETECTED NOT DETECTED Final   Enterococcus Faecium NOT DETECTED NOT DETECTED Final   Listeria monocytogenes NOT DETECTED NOT DETECTED Final   Staphylococcus species NOT DETECTED NOT DETECTED Final   Staphylococcus aureus (BCID) NOT DETECTED NOT DETECTED Final   Staphylococcus epidermidis NOT DETECTED NOT DETECTED Final   Staphylococcus lugdunensis NOT DETECTED NOT DETECTED Final   Streptococcus species DETECTED (A) NOT  DETECTED Final    Comment: CRITICAL RESULT CALLED TO, READ BACK BY AND VERIFIED WITH: JASON BELEUE $RemoveBefo'[email or web address]$  ON 03/05/22 SKL    Streptococcus agalactiae DETECTED (A) NOT DETECTED Final    Comment: CRITICAL RESULT CALLED TO, READ BACK BY AND VERIFIED WITH: JASON BELEUE $RemoveBefo'[email or web address]$  ON 03/05/22 SKL    Streptococcus pneumoniae NOT DETECTED NOT DETECTED Final   Streptococcus pyogenes NOT DETECTED NOT DETECTED Final   A.calcoaceticus-baumannii NOT DETECTED NOT DETECTED Final   Bacteroides fragilis NOT DETECTED NOT DETECTED Final   Enterobacterales NOT DETECTED NOT DETECTED Final   Enterobacter cloacae complex NOT DETECTED NOT DETECTED Final   Escherichia coli NOT DETECTED NOT DETECTED Final   Klebsiella aerogenes NOT  DETECTED NOT DETECTED Final   Klebsiella oxytoca NOT DETECTED NOT DETECTED Final   Klebsiella pneumoniae NOT DETECTED NOT DETECTED Final   Proteus species NOT DETECTED NOT DETECTED Final   Salmonella species NOT DETECTED NOT DETECTED Final   Serratia marcescens NOT DETECTED NOT DETECTED Final   Haemophilus influenzae NOT DETECTED NOT DETECTED Final   Neisseria meningitidis NOT DETECTED NOT DETECTED Final   Pseudomonas aeruginosa NOT DETECTED NOT DETECTED Final   Stenotrophomonas maltophilia NOT DETECTED NOT DETECTED Final   Candida albicans NOT DETECTED NOT DETECTED Final   Candida auris NOT DETECTED NOT DETECTED Final   Candida glabrata NOT DETECTED NOT DETECTED Final   Candida krusei NOT DETECTED NOT DETECTED Final   Candida parapsilosis NOT DETECTED NOT DETECTED Final   Candida tropicalis NOT DETECTED NOT DETECTED Final   Cryptococcus neoformans/gattii NOT DETECTED NOT DETECTED Final    Comment: Performed at [facility], [address] [place], [place] [address]  Blood Culture (routine x 2)     Status: Abnormal   Collection Time: 03/04/22  1:57 PM   Specimen: BLOOD  Result Value Ref Range Status   Specimen Description BLOOD BLOOD RIGHT FOREARM  Final   Special Requests    Final    BOTTLES DRAWN AEROBIC AND ANAEROBIC Blood Culture results may not be optimal due to an inadequate volume of blood received in culture bottles   Culture  Setup Time   Final    GRAM POSITIVE COCCI IN BOTH AEROBIC AND ANAEROBIC BOTTLES CRITICAL VALUE NOTED.  VALUE IS CONSISTENT WITH PREVIOUSLY REPORTED AND CALLED VALUE.    Culture (A)  Final    GROUP B STREP(S.AGALACTIAE)ISOLATED STAPHYLOCOCCUS AUREUS CRITICAL RESULT CALLED TO, READ BACK BY AND VERIFIED WITH: B. BIERS PHARMD, AT [phone] 03/07/22 BY D. VANHOOK SUSCEPTIBILITIES PERFORMED ON PREVIOUS CULTURE WITHIN THE LAST 5 DAYS. Performed at [facility], [place] [address] [place],  [address]    Report Status 03/08/2022 FINAL  Final  Resp Panel by RT-PCR (Flu A&B, Covid) Anterior Nasal Swab     Status: None   Collection Time: 03/04/22  2:12 PM   Specimen: Anterior Nasal Swab  Result Value Ref Range Status   SARS Coronavirus 2 by RT PCR NEGATIVE NEGATIVE Final    Comment: (NOTE) SARS-CoV-2 target nucleic acids are NOT DETECTED.  The SARS-CoV-2 RNA is generally detectable in upper respiratory specimens during the acute phase of infection. The lowest concentration of SARS-CoV-2 viral copies this assay can detect is 138 copies/mL. A negative result does not preclude SARS-Cov-2 infection and should not be used as the sole basis for treatment or other patient management decisions. A negative result may occur with  improper specimen collection/handling, submission of specimen other than nasopharyngeal swab, presence of viral mutation(s) within the areas targeted by this assay, and inadequate number of viral copies(<138 copies/mL). A negative result must be combined with clinical observations, patient history, and epidemiological information. The expected result is Negative.  Fact Sheet for Patients:  EntrepreneurPulse.com.au  Fact Sheet for Healthcare Providers:   IncredibleEmployment.be  This test is no t yet approved or cleared by the [place] FDA and  has been authorized for detection and/or diagnosis of SARS-CoV-2 by FDA under an Emergency Use Authorization (EUA). This EUA will remain  in effect (meaning this test can be used) for the duration of the COVID-19 declaration under Section 564(b)(1) of the Act, 21 U.S.C.section 360bbb-3(b)(1), unless the authorization is terminated  or revoked sooner.  Influenza A by PCR NEGATIVE NEGATIVE Final   Influenza B by PCR NEGATIVE NEGATIVE Final    Comment: (NOTE) The Xpert Xpress SARS-CoV-2/FLU/RSV plus assay is intended as an aid in the diagnosis of influenza from Nasopharyngeal swab specimens and should not be used as a sole basis for treatment. Nasal washings and aspirates are unacceptable for Xpert Xpress SARS-CoV-2/FLU/RSV testing.  Fact Sheet for Patients: EntrepreneurPulse.com.au  Fact Sheet for Healthcare Providers: IncredibleEmployment.be  This test is not yet approved or cleared by the [place] FDA and has been authorized for detection and/or diagnosis of SARS-CoV-2 by FDA under an Emergency Use Authorization (EUA). This EUA will remain in effect (meaning this test can be used) for the duration of the COVID-19 declaration under Section 564(b)(1) of the Act, 21 U.S.C. section 360bbb-3(b)(1), unless the authorization is terminated or revoked.  Performed at [facility], [place], [place] [address]   C Difficile Quick Screen w PCR reflex     Status: None   Collection Time: 03/04/22  9:21 PM   Specimen: Stool  Result Value Ref Range Status   C Diff antigen NEGATIVE NEGATIVE Final   C Diff toxin NEGATIVE NEGATIVE Final   C Diff interpretation No C. difficile detected.  Final    Comment: Performed at [facility], [address] [place], [place] [address]  Gastrointestinal Panel  by PCR , Stool     Status: None   Collection Time: 03/04/22  9:21 PM   Specimen: Stool  Result Value Ref Range Status   Campylobacter species NOT DETECTED NOT DETECTED Final   Plesimonas shigelloides NOT DETECTED NOT DETECTED Final   Salmonella species NOT DETECTED NOT DETECTED Final   Yersinia enterocolitica NOT DETECTED NOT DETECTED Final   Vibrio species NOT DETECTED NOT DETECTED Final   Vibrio cholerae NOT DETECTED NOT DETECTED Final   Enteroaggregative E coli (EAEC) NOT DETECTED NOT DETECTED Final   Enteropathogenic E coli (EPEC) NOT DETECTED NOT DETECTED Final   Enterotoxigenic E coli (ETEC) NOT DETECTED NOT DETECTED Final   Shiga like toxin producing E coli (STEC) NOT DETECTED NOT DETECTED Final   Shigella/Enteroinvasive E coli (EIEC) NOT DETECTED NOT DETECTED Final   Cryptosporidium NOT DETECTED NOT DETECTED Final   Cyclospora cayetanensis NOT DETECTED NOT DETECTED Final   Entamoeba histolytica NOT DETECTED NOT DETECTED Final   Giardia lamblia NOT DETECTED NOT DETECTED Final   Adenovirus F40/41 NOT DETECTED NOT DETECTED Final   Astrovirus NOT DETECTED NOT DETECTED Final   Norovirus GI/GII NOT DETECTED NOT DETECTED Final   Rotavirus A NOT DETECTED NOT DETECTED Final   Sapovirus (I, II, IV, and V) NOT DETECTED NOT DETECTED Final    Comment: Performed at [facility], [address], [place], [place] [address]  Urine Culture     Status: Abnormal   Collection Time: 03/04/22  9:39 PM   Specimen: In/Out Cath Urine  Result Value Ref Range Status   Specimen Description   Final    IN/OUT CATH URINE Performed at [facility], [address] [place], [place] [address]    Special Requests   Final    NONE Performed at [facility], [address] [place],  [address]    Culture MULTIPLE SPECIES PRESENT, SUGGEST RECOLLECTION (A)  Final   Report Status 03/07/2022 FINAL  Final  Culture, blood (Routine X 2) w Reflex to ID Panel     Status: None    Collection Time: 03/06/22  4:50 AM  Specimen: BLOOD  Result Value Ref Range Status   Specimen Description BLOOD RIGHT ANTECUBITAL  Final   Special Requests   Final    BOTTLES DRAWN AEROBIC AND ANAEROBIC Blood Culture adequate volume   Culture   Final    NO GROWTH 5 DAYS Performed at [facility], [address] [place], [place] [address]    Report Status 03/11/2022 FINAL  Final  Culture, blood (Routine X 2) w Reflex to ID Panel     Status: None   Collection Time: 03/06/22  4:50 AM   Specimen: BLOOD  Result Value Ref Range Status   Specimen Description BLOOD BLOOD RIGHT WRIST  Final   Special Requests   Final    BOTTLES DRAWN AEROBIC AND ANAEROBIC Blood Culture adequate volume   Culture   Final    NO GROWTH 5 DAYS Performed at [facility], [address], [place], [place] [address]    Report Status 03/11/2022 FINAL  Final    Coagulation Studies: No results for input(s): "LABPROT", "INR" in the last 72 hours.   Urinalysis: No results for input(s): "COLORURINE", "LABSPEC", "PHURINE", "GLUCOSEU", "HGBUR", "BILIRUBINUR", "KETONESUR", "PROTEINUR", "UROBILINOGEN", "NITRITE", "LEUKOCYTESUR" in the last 72 hours.  Invalid input(s): "APPERANCEUR"    Imaging: No results found.   Medications:    anticoagulant sodium citrate      (feeding supplement) PROSource Plus  30 mL Oral TID BM   amLODipine  10 mg Oral Daily   aspirin EC  325 mg Oral BID   atorvastatin  10 mg Oral Daily   calcium acetate  667 mg Oral TID WC   carvedilol  6.25 mg Oral BID   Chlorhexidine Gluconate Cloth  6 each Topical Q0600   docusate sodium  100 mg Oral BID   famotidine  20 mg Oral Daily   feeding supplement (NEPRO CARB STEADY)  237 mL Oral BID BM   heparin injection (subcutaneous)  5,000 Units Subcutaneous Q8H   isosorbide mononitrate  30 mg Oral Daily   lactulose  20 g Oral BID   lisinopril  40 mg Oral Daily   multivitamin  1 tablet Oral QHS   acetaminophen, albuterol,  alteplase, anticoagulant sodium citrate, bisacodyl, heparin, hydrALAZINE, HYDROcodone-acetaminophen, lidocaine (PF), lidocaine-prilocaine, magnesium hydroxide, menthol-cetylpyridinium **OR** phenol, metoCLOPramide **OR** metoCLOPramide (REGLAN) injection, morphine injection, ondansetron **OR** ondansetron (ZOFRAN) IV, pentafluoroprop-tetrafluoroeth, traZODone  Assessment/ Plan:  Mr. Huntley Knoop is a 61 y.o.  male past medical conditions including type 2 diabetes, hypertension, dyslipidemia, systolic heart failure with EF 45 to 50% and end-stage renal disease on hemodialysis.  Patient presents to the emergency department after suffering a fall with left hip pain.  Patient has been admitted for ESRD (end stage renal disease) on dialysis ([place]) [N18.6, Z99.2] Closed left hip fracture ([place]) [S72.002A] Closed displaced intertrochanteric fracture of left femur, initial encounter ([place]) [S72.142A]  [facility]/TTS/left aVF  End-stage renal disease with hyperkalemia on hemodialysis.  Will maintain outpatient schedule if possible.   Patient was last dialyzed yesterday-Saturday  Renal navigator monitoring discharge plan which will likely include rehab placement.  2. Anemia of chronic kidney disease Lab Results  Component Value Date   HGB 11.6 (L) 05/02/2022    Patient receives [place] outpatient.  Hemoglobin within desired target.  3. Diabetes mellitus type II with chronic kidney disease/renal manifestations: noninsulin dependent.  Most recent hemoglobin A1c is 5.6 on 04/29/22.   Glucose well controlled.  4.  Hypertension with chronic kidney disease.  Home regimen includes amlodipine, carvedilol,  isosorbide, and lisinopril.Lisinopril currently held during admission.  Blood pressure163/85.  5. Secondary Hyperparathyroidism: with outpatient labs: PTH 759, phosphorus 11.5, calcium 8.6 on 04/22/22.   Lab Results  Component Value Date   PTH 301 (H) 09/24/2020   CALCIUM 8.8 (L)  05/02/2022   CAION 1.21 06/27/2019   PHOS 11.6 (H) 05/01/2022  Calcium acetate ordered with meals outpatient.  Calcium below target.  Continue calcium acetate.     6.Hyperkalemia Patient was dialyzed with a lower K bath yesterday Potassium is now better     LOS: 3 Meena Barrantes s Brannen Koppen 8/27/20238:51 AM

## 2022-05-02 NOTE — Evaluation (Signed)
Occupational Therapy Evaluation Patient Details Name: Steve Vance MRN: [id number] DOB: 05-13-1961 Today's Date: 05/02/2022   History of Present Illness presented to ER secondary to mechanical fall in home environment with acute onset of L hip pain; admitted for manaegment of L hip fracture, s/p IM nailing (04/29/22), WBAT.   Clinical Impression   Mr. Paolini presents with generalized weakness, limited endurance, and pain. He lives in a 2-story house with his niece, w/ pt having to go up/down full staircase to his bedroom/bathroom. He denies falls other than the one that resulted in current hip fx. Pt has been mod I in fxl mobility, ambulates w/ SPC, drives. During today's evaluation, pt presented today with much less pain than during rehab session 2 days earlier. He was able to come into standing from recliner using RW, with Min A, close SUPV for safety x 3 attempts, with progressively less support needed on each subsequent trial. Pt able to maintain balance ~ 3 minutes/attempt, take small steps forward and backwards w/ b/l LE, weight shift L-R. Pt expresses interest in ambulating to RW, but ultimately feels too unstable in standing to complete. Provided educ re: therex in sitting, falls prevention, car transfer techniques, with pt verbalizing understanding and stating he is eager/motivated to work on recovery process. Recommend ongoing OT while hospitalized, with DC to SNF; pt in agreement, stating his preference would be DC to [facility].    Recommendations for follow up therapy are one component of a multi-disciplinary discharge planning process, led by the attending physician.  Recommendations may be updated based on patient status, additional functional criteria and insurance authorization.   Follow Up Recommendations  Skilled nursing-short term rehab (<3 hours/day)    Assistance Recommended at Discharge    Patient can return home with the following A little help with walking and/or  transfers;A little help with bathing/dressing/bathroom;Assistance with cooking/housework    Functional Status Assessment  Patient has had a recent decline in their functional status and demonstrates the ability to make significant improvements in function in a reasonable and predictable amount of time.  Equipment Recommendations  None recommended by OT    Recommendations for Other Services       Precautions / Restrictions Precautions Precautions: Fall Precaution Comments: No BP L UE; high fall risk Restrictions Weight Bearing Restrictions: Yes LLE Weight Bearing: Weight bearing as tolerated      Mobility Bed Mobility               General bed mobility comments: pt received, left in recliner    Transfers Overall transfer level: Needs assistance Equipment used: Rolling walker (2 wheels) Transfers: Sit to/from Stand Sit to Stand: Min assist                  Balance Overall balance assessment: Needs assistance Sitting-balance support: No upper extremity supported, Feet supported Sitting balance-Leahy Scale: Good     Standing balance support: Bilateral upper extremity supported, Reliant on assistive device for balance Standing balance-Leahy Scale: Poor                             ADL either performed or assessed with clinical judgement   ADL Overall ADL's : Needs assistance/impaired     Grooming: Set up;Supervision/safety               Lower Body Dressing: Maximal assistance   Toilet Transfer: Maximal assistance Toilet Transfer Details (indicate cue type and reason): Unable to  transfer to [facility], instead opts for bedpan                 Vision         Perception     Praxis      Pertinent Vitals/Pain Pain Assessment Pain Assessment: 0-10 Pain Score: 6  Pain Location: L hip Pain Descriptors / Indicators: Aching, Grimacing, Guarding Pain Intervention(s): Limited activity within patient's tolerance, Repositioned, Utilized  relaxation techniques     Hand Dominance Right   Extremity/Trunk Assessment Upper Extremity Assessment Upper Extremity Assessment: Overall WFL for tasks assessed   Lower Extremity Assessment Lower Extremity Assessment: Generalized weakness;LLE deficits/detail;RLE deficits/detail RLE Sensation: history of peripheral neuropathy LLE: Unable to fully assess due to pain LLE Sensation: history of peripheral neuropathy       Communication Communication Communication: No difficulties   Cognition Arousal/Alertness: Awake/alert Behavior During Therapy: WFL for tasks assessed/performed Overall Cognitive Status: Within Functional Limits for tasks assessed                                       General Comments       Exercises Other Exercises Other Exercises: Educ re: falls prevention, safe use of DME, rehab process, DC recs   Shoulder Instructions      Home Living Family/patient expects to be discharged to:: Private residence Living Arrangements: Other relatives (lives with his niece and his brother) Available Help at Discharge: Friend(s);Available PRN/intermittently Type of Home: House Home Access: Ramped entrance     Home Layout: Two level;Bed/bath upstairs     Bathroom Shower/Tub: Teacher, early years/pre: Standard     Home Equipment: Conservation officer, nature (2 wheels);Cane - single point          Prior Functioning/Environment Prior Level of Function : Independent/Modified Independent             Mobility Comments: Mod indep with ADLs, household and community mobilization, w/ occasional use of SPC; denies additional fall history; + driving to/from dialysis ADLs Comments: IND        OT Problem List: Decreased strength;Decreased range of motion;Decreased activity tolerance;Impaired balance (sitting and/or standing);Pain      OT Treatment/Interventions: Self-care/ADL training;Therapeutic exercise;Patient/family education;Balance  training;Energy conservation;Therapeutic activities;DME and/or AE instruction    OT Goals(Current goals can be found in the care plan section) Acute Rehab OT Goals Patient Stated Goal: to get back to normal ASAP OT Goal Formulation: With patient Time For Goal Achievement: 05/16/22 Potential to Achieve Goals: Good ADL Goals Pt Will Perform Lower Body Dressing: with modified independence;sitting/lateral leans Pt Will Transfer to Toilet: regular height toilet;with modified independence;stand pivot transfer Pt Will Perform Tub/Shower Transfer: Tub transfer;with modified independence;Stand pivot transfer;shower seat;ambulating  OT Frequency: Min 2X/week    Co-evaluation              AM-PAC OT "6 Clicks" Daily Activity     Outcome Measure Help from another person eating meals?: None Help from another person taking care of personal grooming?: A Little Help from another person toileting, which includes using toliet, bedpan, or urinal?: A Lot Help from another person bathing (including washing, rinsing, drying)?: A Lot Help from another person to put on and taking off regular upper body clothing?: A Little Help from another person to put on and taking off regular lower body clothing?: A Lot 6 Click Score: 16   End of Session Equipment Utilized During Treatment:  Rolling walker (2 wheels)  Activity Tolerance: Patient tolerated treatment well Patient left: in chair;with call bell/phone within reach;with chair alarm set  OT Visit Diagnosis: Unsteadiness on feet (R26.81);Muscle weakness (generalized) (M62.81);Pain                Time: 1071-2524 OT Time Calculation (min): 18 min Charges:  OT General Charges $OT Visit: 1 Visit OT Evaluation $OT Eval Low Complexity: 1 Low OT Treatments $Self Care/Home Management : 8-22 mins Josiah Lobo, PhD, MS, OTR/L 05/02/22, 12:49 PM

## 2022-05-02 NOTE — Progress Notes (Signed)
Subjective: 3 Days Post-Op Procedure(s) (LRB): INTRAMEDULLARY (IM) NAIL INTERTROCHANTERIC (Left)   Patient is sitting up in a chair awake and alert.  He is not in much pain.  He was in dialysis yesterday when I came by.  He is working with PT.  Patient reports pain as mild.  Objective:   VITALS:   Vitals:   05/02/22 0435 05/02/22 0807  BP: 137/72 (!) 161/76  Pulse: 73 73  Resp: 17 16  Temp: 97.9 F (36.6 C) 98.2 F (36.8 C)  SpO2: 95% 97%    Neurologically intact Incision: dressing C/D/I  LABS Recent Labs    05/01/22 0418 05/02/22 0630  HGB 11.6* 11.6*  HCT 36.8* 36.4*  WBC 5.0 5.6  PLT 246 243    Recent Labs    05/01/22 0418 05/02/22 0630  NA 133* 134*  K 5.5* 5.1  BUN 59* 46*  CREATININE 11.08* 9.03*  GLUCOSE 107* 93    No results for input(s): "LABPT", "INR" in the last 72 hours.   Assessment/Plan: 3 Days Post-Op Procedure(s) (LRB): INTRAMEDULLARY (IM) NAIL INTERTROCHANTERIC (Left)   Discharge to SNF

## 2022-05-02 NOTE — Progress Notes (Deleted)
Occupational Therapy Treatment Patient Details Name: Steve Vance MRN: [id number] DOB: [birth year] Today's Date: 05/02/2022   History of present illness presented to ER secondary to mechanical fall in home environment with acute onset of L hip pain; admitted for manaegment of L hip fracture, s/p IM nailing (04/29/22), WBAT.   OT comments  Steve Vance presented today with much less pain than during rehab session 2 days earlier. He was able to come into standing from recliner using RW, with Min A, close SUPV for safety x 3 attempts, with progressively less support needed on each subsequent trial. Pt able to maintain balance ~ 3 minutes/attempt, take small steps forward and backwards w/ b/l LE, weight shift L-R. Provided educ re: therex in sitting, falls prevention, car transfer techniques, with pt verbalizing understanding and stating he is eager/motivated to work on recovery process. Recommend ongoing OT while hospitalized, with DC to SNF; pt in agreement, stating his preference would be DC to [facility].    Recommendations for follow up therapy are one component of a multi-disciplinary discharge planning process, led by the attending physician.  Recommendations may be updated based on patient status, additional functional criteria and insurance authorization.    Follow Up Recommendations  Skilled nursing-short term rehab (<3 hours/day)    Assistance Recommended at Discharge    Patient can return home with the following  A little help with walking and/or transfers;A little help with bathing/dressing/bathroom;Assistance with cooking/housework   Equipment Recommendations  None recommended by OT    Recommendations for Other Services      Precautions / Restrictions Precautions Precautions: Fall Precaution Comments: No BP L UE; high fall risk Restrictions Weight Bearing Restrictions: Yes LLE Weight Bearing: Weight bearing as tolerated       Mobility Bed Mobility                General bed mobility comments: pt received, left in recliner    Transfers Overall transfer level: Needs assistance Equipment used: Rolling walker (2 wheels) Transfers: Sit to/from Stand Sit to Stand: Min assist                 Balance Overall balance assessment: Needs assistance Sitting-balance support: No upper extremity supported, Feet supported Sitting balance-Leahy Scale: Good     Standing balance support: Bilateral upper extremity supported, Reliant on assistive device for balance Standing balance-Leahy Scale: Poor                             ADL either performed or assessed with clinical judgement   ADL Overall ADL's : Needs assistance/impaired     Grooming: Set up;Supervision/safety               Lower Body Dressing: Maximal assistance   Toilet Transfer: Maximal assistance Toilet Transfer Details (indicate cue type and reason): Unable to transfer to [facility], instead opts for bedpan                Extremity/Trunk Assessment Upper Extremity Assessment Upper Extremity Assessment: Overall WFL for tasks assessed   Lower Extremity Assessment Lower Extremity Assessment: Generalized weakness        Vision       Perception     Praxis      Cognition Arousal/Alertness: Awake/alert Behavior During Therapy: WFL for tasks assessed/performed Overall Cognitive Status: Within Functional Limits for tasks assessed  Exercises Other Exercises Other Exercises: Educ re: falls prevention, safe use of DME, rehab process, DC recs    Shoulder Instructions       General Comments      Pertinent Vitals/ Pain       Pain Assessment Pain Assessment: 0-10 Pain Score: 6  Pain Location: L hip Pain Descriptors / Indicators: Aching, Grimacing, Guarding Pain Intervention(s): Limited activity within patient's tolerance, Repositioned, Utilized relaxation techniques  Home Living Family/patient  expects to be discharged to:: Private residence Living Arrangements: Other relatives (lives with his niece and his brother) Available Help at Discharge: Friend(s);Available PRN/intermittently Type of Home: House Home Access: Ramped entrance     Home Layout: Two level;Bed/bath upstairs     Bathroom Shower/Tub: Teacher, early years/pre: Standard     Home Equipment: Conservation officer, nature (2 wheels);Cane - single point          Prior Functioning/Environment              Frequency  Min 2X/week        Progress Toward Goals  OT Goals(current goals can now be found in the care plan section)     Acute Rehab OT Goals Patient Stated Goal: to get back to normal ASAP OT Goal Formulation: With patient Time For Goal Achievement: 05/16/22 Potential to Achieve Goals: Good ADL Goals Pt Will Perform Lower Body Dressing: with modified independence;sitting/lateral leans Pt Will Transfer to Toilet: regular height toilet;with modified independence;stand pivot transfer Pt Will Perform Tub/Shower Transfer: Tub transfer;with modified independence;Stand pivot transfer;shower seat;ambulating  Plan      Co-evaluation                 AM-PAC OT "6 Clicks" Daily Activity     Outcome Measure   Help from another person eating meals?: None Help from another person taking care of personal grooming?: A Little Help from another person toileting, which includes using toliet, bedpan, or urinal?: A Lot Help from another person bathing (including washing, rinsing, drying)?: A Lot Help from another person to put on and taking off regular upper body clothing?: A Little Help from another person to put on and taking off regular lower body clothing?: A Lot 6 Click Score: 16    End of Session Equipment Utilized During Treatment: Rolling walker (2 wheels)  OT Visit Diagnosis: Unsteadiness on feet (R26.81);Muscle weakness (generalized) (M62.81);Pain   Activity Tolerance Patient tolerated  treatment well   Patient Left in chair;with call bell/phone within reach;with chair alarm set   Nurse Communication          Time: 9833-8250 OT Time Calculation (min): 18 min  Charges: OT General Charges $OT Visit: 1 Visit OT Treatments $Self Care/Home Management : 8-22 mins Steve Lobo, PhD, MS, OTR/L 05/02/22, 12:31 PM

## 2022-05-03 DIAGNOSIS — S72002A Fracture of unspecified part of neck of left femur, initial encounter for closed fracture: Secondary | ICD-10-CM | POA: Diagnosis not present

## 2022-05-03 NOTE — Progress Notes (Signed)
Pt refusing to sit up in chair stating he does not feel good and should not have to if he's not up for it. Advised patient on mobility goals howver pt becomes agitated and annoyed further.

## 2022-05-03 NOTE — TOC Progression Note (Signed)
Transition of Care [facility]) - Progression Note    Patient Details  Name: Arash Karstens MRN: [id number] Date of Birth: 18-Dec-1960  Transition of Care [facility]) CM/SW [place], RN Phone Number: 05/03/2022, 4:50 PM  Clinical Narrative:     Met with the patient and reviewed the bed offers, he chose [facility], I notified Tonya at [facility],  [place] pending  Expected Discharge Plan: [place] Barriers to Discharge: Continued Medical Work up, Ship broker, SNF Pending bed offer  Expected Discharge Plan and Services Expected Discharge Plan: [place]: [place] arrangements for the past 2 months: Single Family Home                                       Social Determinants of Health (SDOH) Interventions    Readmission Risk Interventions    03/05/2022    8:56 AM 09/25/2020    2:19 PM 09/12/2020    2:14 PM  Readmission Risk Prevention Plan  Transportation Screening Complete Complete Complete  PCP or Specialist Appt within 3-5 Days Complete Complete Complete  HRI or Home Care Consult  Complete Complete  Social Work Consult for [place] Planning/Counseling Complete Complete Complete  Palliative Care Screening Not Applicable Not Applicable Not Applicable  Medication Review Press photographer) Complete Complete Complete

## 2022-05-03 NOTE — Progress Notes (Signed)
[place] [facility] [place] NAME: Steve Vance    MR#:  [id number]  DATE OF BIRTH:  03/20/1961  SUBJECTIVE:  patient was on the bedside commode and was actively vomiting. I am not feeling good   VITALS:  Blood pressure (!) 156/81, pulse 80, temperature 98.1 F (36.7 C), resp. rate 19, height $RemoveBe'5\' 7"'zoYCFwAsT$  (1.702 m), weight 99.1 kg, SpO2 97 %.  PHYSICAL EXAMINATION:   GENERAL:  61 y.o.-year-old patient lying in the bed with no acute distress. Looks older than stated age, chronically ill LUNGS: Normal breath sounds bilaterally, no wheezing, rales, rhonchi.  CARDIOVASCULAR: S1, S2 normal. No murmurs, rubs, or gallops.  ABDOMEN: Soft, nontender, nondistended. Bowel sounds present.  EXTREMITIES: No  edema b/l.   HD access NEUROLOGIC: nonfocal  patient is alert and awake SKIN: No obvious rash, lesion, or ulcer.   LABORATORY PANEL:  CBC Recent Labs  Lab 05/02/22 0630  WBC 5.6  HGB 11.6*  HCT 36.4*  PLT 243     Chemistries  Recent Labs  Lab 04/29/22 0148 04/29/22 0456 05/02/22 0630  NA 135   < > 134*  K 5.8*   < > 5.1  CL 96*   < > 94*  CO2 21*   < > 28  GLUCOSE 132*   < > 93  BUN 70*   < > 46*  CREATININE 12.40*   < > 9.03*  CALCIUM 8.0*   < > 8.8*  AST 27  --   --   ALT 16  --   --   ALKPHOS 80  --   --   BILITOT 1.0  --   --    < > = values in this interval not displayed.    Cardiac Enzymes No results for input(s): "TROPONINI" in the last 168 hours. RADIOLOGY:  No results found.  Assessment and Plan  Ronn Smolinsky is a 61 y.o. African-American male male with medical history significant for end-stage renal disease on hemodialysis on TTS, systolic CHF with EF of 45 to 50%, type 2 diabetes mellitus, hypertension and dyslipidemia, who presented to the ER with acute onset of accidental mechanical fall with subsequent left hip pain.  The patient fell a couple of steps as his foot gave way per his report.  Closed left hip fracture  (HCC) mechanical fall -- patient is status post surgery by Dr. Sharlet Salina -start PT OT- recommends rehab --PRN pain meds -- TOC for discharge planning    ESRD on dialysis [facility]) - Nephrology consult with Dr. Candiss Norse  -- patient was dialyzed before surgery and will be placed on his routine dialysis schedule  Type 2 diabetes mellitus with hyperlipidemia ([place]) - continue SSI, not on any po meds for DM at home - continue statin therapy.   Essential hypertension -- resume home meds   GERD without esophagitis - continue H2 blocker therapy.  Nausea vomiting -- PRN Zofran -- patient did report having a bowel movement today (8/28)    Procedures: left hip surgery Family communication : none Consults : orthopedic CODE STATUS: full DVT Prophylaxis : heparin Level of care: Med-Surg Status is: Inpatient Remains inpatient appropriate because: postop day 2 hip surgery.  Barrier to Discharge Per TOC--pt wants to go [facility]!    TOTAL TIME TAKING CARE OF THIS PATIENT: 35 minutes.  >50% time spent on counselling and coordination of care  Note: This dictation was prepared with [employer]  dictation along with smaller phrase technology. Any transcriptional errors that result from this process are unintentional.  Fritzi Mandes M.D    [facility]   CC: Primary care physician; Alene Mires, Elyse Jarvis, MD

## 2022-05-03 NOTE — Progress Notes (Signed)
[facility]  ROUNDING NOTE   Subjective:   Steve Vance is a 61 year old male with past medical conditions including type 2 diabetes, hypertension, dyslipidemia, systolic heart failure with EF 45 to 50% and end-stage renal disease on hemodialysis.  Patient presents to the emergency department after suffering a fall with left hip pain.  Patient has been admitted for ESRD (end stage renal disease) on dialysis ([place]) [N18.6, Z99.2] Closed left hip fracture ([place]) [S72.002A] Closed displaced intertrochanteric fracture of left femur, initial encounter [facility]) [S72.142A]  Patient is known to our practice and receives outpatient dialysis treatments at [facility] on a TTS schedule.    UPdate  Patient sitting up in bed, Eating breakfast Complains of left hip discomfort with movement Pain well managed with prescribed meds    Objective:  Vital signs in last 24 hours:  Temp:  [97.5 F (36.4 C)-98.1 F (36.7 C)] 98.1 F (36.7 C) (08/28 0832) Pulse Rate:  [71-82] 80 (08/28 0832) Resp:  [17-19] 19 (08/28 0832) BP: (128-156)/(72-88) 156/81 (08/28 0832) SpO2:  [84 %-100 %] 97 % (08/28 0832)  Weight change:  Filed Weights   04/29/22 0148 05/01/22 0849 05/01/22 1259  Weight: 80 kg 102.1 kg 99.1 kg    Intake/Output: I/O last 3 completed shifts: In: 240 [P.O.:240] Out: 0    Intake/Output this shift:  No intake/output data recorded.  Physical Exam: General: NAD  Head: Normocephalic, atraumatic. Moist oral mucosal membranes  Eyes: Anicteric  Lungs:  Diminished in bases, normal effort, room air  Heart: Regular rate and rhythm  Abdomen:  Soft, nontender, obese  Extremities: None peripheral edema.  Neurologic: Nonfocal, moving all four extremities  Skin: No lesions.  Left hip surgical dressing.  Access: Left aVF    Basic Metabolic Panel: Recent Labs  Lab 04/29/22 0148 04/29/22 0456 05/01/22 0418 05/02/22 0630  NA 135 136 133* 134*  K 5.8* 5.3* 5.5* 5.1   CL 96* 102 95* 94*  CO2 21* 20* 24 28  GLUCOSE 132* 118* 107* 93  BUN 70* 64* 59* 46*  CREATININE 12.40* 11.04* 11.08* 9.03*  CALCIUM 8.0* 7.3* 8.6* 8.8*  PHOS  --   --  11.6*  --      Liver Function Tests: Recent Labs  Lab 04/29/22 0148  AST 27  ALT 16  ALKPHOS 80  BILITOT 1.0  PROT 8.3*  ALBUMIN 3.3*    No results for input(s): "LIPASE", "AMYLASE" in the last 168 hours. No results for input(s): "AMMONIA" in the last 168 hours.  CBC: Recent Labs  Lab 04/29/22 0148 04/29/22 0456 05/01/22 0418 05/02/22 0630  WBC 8.0 7.3 5.0 5.6  HGB 11.5* 12.4* 11.6* 11.6*  HCT 36.3* 39.3 36.8* 36.4*  MCV 88.8 89.3 88.2 89.9  PLT 238 211 246 243     Cardiac Enzymes: No results for input(s): "CKTOTAL", "CKMB", "CKMBINDEX", "TROPONINI" in the last 168 hours.  BNP: Invalid input(s): "POCBNP"  CBG: Recent Labs  Lab 04/29/22 1700 04/29/22 1913  GLUCAP 113* 98     Microbiology: Results for orders placed or performed during the hospital encounter of 03/04/22  Blood Culture (routine x 2)     Status: Abnormal   Collection Time: 03/04/22  1:52 PM   Specimen: BLOOD  Result Value Ref Range Status   Specimen Description   Final    BLOOD RAC Performed at [facility], [address] [place], [place] [address]    Special Requests   Final    BOTTLES DRAWN AEROBIC AND  ANAEROBIC BCAV Performed at [facility], [address] [place], [place] [address]    Culture  Setup Time   Final    Organism ID to follow GRAM POSITIVE COCCI IN BOTH AEROBIC AND ANAEROBIC BOTTLES CRITICAL RESULT CALLED TO, READ BACK BY AND VERIFIED WITH: JASON BELEUE $RemoveBefo'[email or web address]$  ON 03/05/22 SKL GRAM STAIN REVIEWED-AGREE WITH RESULT    Culture (A)  Final    GROUP B STREP(S.AGALACTIAE)ISOLATED STAPHYLOCOCCUS AUREUS CRITICAL RESULT CALLED TO, READ BACK BY AND VERIFIED WITH: B. BIERS PHARMD, AT 0223 03/07/22 D. VANHOOK Performed at [facility], [place] [address], [place],  [address]     Report Status 03/08/2022 FINAL  Final   Organism ID, Bacteria GROUP B STREP(S.AGALACTIAE)ISOLATED  Final   Organism ID, Bacteria STAPHYLOCOCCUS AUREUS  Final      Susceptibility   Group b strep(s.agalactiae)isolated - MIC*    CLINDAMYCIN <=0.25 SENSITIVE Sensitive     AMPICILLIN <=0.25 SENSITIVE Sensitive     ERYTHROMYCIN <=0.12 SENSITIVE Sensitive     VANCOMYCIN 0.5 SENSITIVE Sensitive     CEFTRIAXONE <=0.12 SENSITIVE Sensitive     LEVOFLOXACIN 1 SENSITIVE Sensitive     PENICILLIN Value in next row Sensitive      SENSITIVE<=0.06    * GROUP B STREP(S.AGALACTIAE)ISOLATED   Staphylococcus aureus - MIC*    CIPROFLOXACIN Value in next row Sensitive      SENSITIVE<=0.06    ERYTHROMYCIN Value in next row Sensitive      SENSITIVE<=0.06    GENTAMICIN Value in next row Sensitive      SENSITIVE<=0.06    OXACILLIN Value in next row Sensitive      SENSITIVE<=0.06    TETRACYCLINE Value in next row Sensitive      SENSITIVE<=0.06    VANCOMYCIN Value in next row Sensitive      SENSITIVE<=0.06    TRIMETH/SULFA Value in next row Sensitive      SENSITIVE<=0.06    CLINDAMYCIN Value in next row Sensitive      SENSITIVE<=0.06    RIFAMPIN Value in next row Sensitive      SENSITIVE<=0.06    Inducible Clindamycin Value in next row Sensitive      SENSITIVE<=0.06    * STAPHYLOCOCCUS AUREUS  Blood Culture ID Panel (Reflexed)     Status: Abnormal   Collection Time: 03/04/22  1:52 PM  Result Value Ref Range Status   Enterococcus faecalis NOT DETECTED NOT DETECTED Final   Enterococcus Faecium NOT DETECTED NOT DETECTED Final   Listeria monocytogenes NOT DETECTED NOT DETECTED Final   Staphylococcus species NOT DETECTED NOT DETECTED Final   Staphylococcus aureus (BCID) NOT DETECTED NOT DETECTED Final   Staphylococcus epidermidis NOT DETECTED NOT DETECTED Final   Staphylococcus lugdunensis NOT DETECTED NOT DETECTED Final   Streptococcus species DETECTED (A) NOT DETECTED Final    Comment: CRITICAL  RESULT CALLED TO, READ BACK BY AND VERIFIED WITH: JASON BELEUE $RemoveBefo'[email or web address]$  ON 03/05/22 SKL    Streptococcus agalactiae DETECTED (A) NOT DETECTED Final    Comment: CRITICAL RESULT CALLED TO, READ BACK BY AND VERIFIED WITH: JASON BELEUE $RemoveBefo'[email or web address]$  ON 03/05/22 SKL    Streptococcus pneumoniae NOT DETECTED NOT DETECTED Final   Streptococcus pyogenes NOT DETECTED NOT DETECTED Final   A.calcoaceticus-baumannii NOT DETECTED NOT DETECTED Final   Bacteroides fragilis NOT DETECTED NOT DETECTED Final   Enterobacterales NOT DETECTED NOT DETECTED Final   Enterobacter cloacae complex NOT DETECTED NOT DETECTED Final   Escherichia coli NOT DETECTED NOT DETECTED Final   Klebsiella aerogenes NOT  DETECTED NOT DETECTED Final   Klebsiella oxytoca NOT DETECTED NOT DETECTED Final   Klebsiella pneumoniae NOT DETECTED NOT DETECTED Final   Proteus species NOT DETECTED NOT DETECTED Final   Salmonella species NOT DETECTED NOT DETECTED Final   Serratia marcescens NOT DETECTED NOT DETECTED Final   Haemophilus influenzae NOT DETECTED NOT DETECTED Final   Neisseria meningitidis NOT DETECTED NOT DETECTED Final   Pseudomonas aeruginosa NOT DETECTED NOT DETECTED Final   Stenotrophomonas maltophilia NOT DETECTED NOT DETECTED Final   Candida albicans NOT DETECTED NOT DETECTED Final   Candida auris NOT DETECTED NOT DETECTED Final   Candida glabrata NOT DETECTED NOT DETECTED Final   Candida krusei NOT DETECTED NOT DETECTED Final   Candida parapsilosis NOT DETECTED NOT DETECTED Final   Candida tropicalis NOT DETECTED NOT DETECTED Final   Cryptococcus neoformans/gattii NOT DETECTED NOT DETECTED Final    Comment: Performed at [facility], [address] [place], [place] [address]  Blood Culture (routine x 2)     Status: Abnormal   Collection Time: 03/04/22  1:57 PM   Specimen: BLOOD  Result Value Ref Range Status   Specimen Description BLOOD BLOOD RIGHT FOREARM  Final   Special Requests   Final    BOTTLES DRAWN AEROBIC  AND ANAEROBIC Blood Culture results may not be optimal due to an inadequate volume of blood received in culture bottles   Culture  Setup Time   Final    GRAM POSITIVE COCCI IN BOTH AEROBIC AND ANAEROBIC BOTTLES CRITICAL VALUE NOTED.  VALUE IS CONSISTENT WITH PREVIOUSLY REPORTED AND CALLED VALUE.    Culture (A)  Final    GROUP B STREP(S.AGALACTIAE)ISOLATED STAPHYLOCOCCUS AUREUS CRITICAL RESULT CALLED TO, READ BACK BY AND VERIFIED WITH: B. BIERS PHARMD, AT 3976 03/07/22 BY D. VANHOOK SUSCEPTIBILITIES PERFORMED ON PREVIOUS CULTURE WITHIN THE LAST 5 DAYS. Performed at [facility], [place] [address], [place], [place] [address]    Report Status 03/08/2022 FINAL  Final  Resp Panel by RT-PCR (Flu A&B, Covid) Anterior Nasal Swab     Status: None   Collection Time: 03/04/22  2:12 PM   Specimen: Anterior Nasal Swab  Result Value Ref Range Status   SARS Coronavirus 2 by RT PCR NEGATIVE NEGATIVE Final    Comment: (NOTE) SARS-CoV-2 target nucleic acids are NOT DETECTED.  The SARS-CoV-2 RNA is generally detectable in upper respiratory specimens during the acute phase of infection. The lowest concentration of SARS-CoV-2 viral copies this assay can detect is 138 copies/mL. A negative result does not preclude SARS-Cov-2 infection and should not be used as the sole basis for treatment or other patient management decisions. A negative result may occur with  improper specimen collection/handling, submission of specimen other than nasopharyngeal swab, presence of viral mutation(s) within the areas targeted by this assay, and inadequate number of viral copies(<138 copies/mL). A negative result must be combined with clinical observations, patient history, and epidemiological information. The expected result is Negative.  Fact Sheet for Patients:  EntrepreneurPulse.com.au  Fact Sheet for Healthcare Providers:  IncredibleEmployment.be  This test is no t yet  approved or cleared by the [place] FDA and  has been authorized for detection and/or diagnosis of SARS-CoV-2 by FDA under an Emergency Use Authorization (EUA). This EUA will remain  in effect (meaning this test can be used) for the duration of the COVID-19 declaration under Section 564(b)(1) of the Act, 21 U.S.C.section 360bbb-3(b)(1), unless the authorization is terminated  or revoked sooner.  Influenza A by PCR NEGATIVE NEGATIVE Final   Influenza B by PCR NEGATIVE NEGATIVE Final    Comment: (NOTE) The Xpert Xpress SARS-CoV-2/FLU/RSV plus assay is intended as an aid in the diagnosis of influenza from Nasopharyngeal swab specimens and should not be used as a sole basis for treatment. Nasal washings and aspirates are unacceptable for Xpert Xpress SARS-CoV-2/FLU/RSV testing.  Fact Sheet for Patients: EntrepreneurPulse.com.au  Fact Sheet for Healthcare Providers: IncredibleEmployment.be  This test is not yet approved or cleared by the [place] FDA and has been authorized for detection and/or diagnosis of SARS-CoV-2 by FDA under an Emergency Use Authorization (EUA). This EUA will remain in effect (meaning this test can be used) for the duration of the COVID-19 declaration under Section 564(b)(1) of the Act, 21 U.S.C. section 360bbb-3(b)(1), unless the authorization is terminated or revoked.  Performed at [facility], [place], [place] [address]   C Difficile Quick Screen w PCR reflex     Status: None   Collection Time: 03/04/22  9:21 PM   Specimen: Stool  Result Value Ref Range Status   C Diff antigen NEGATIVE NEGATIVE Final   C Diff toxin NEGATIVE NEGATIVE Final   C Diff interpretation No C. difficile detected.  Final    Comment: Performed at [facility], [address] [place], [place] [address]  Gastrointestinal Panel by PCR , Stool     Status: None   Collection Time: 03/04/22  9:21 PM    Specimen: Stool  Result Value Ref Range Status   Campylobacter species NOT DETECTED NOT DETECTED Final   Plesimonas shigelloides NOT DETECTED NOT DETECTED Final   Salmonella species NOT DETECTED NOT DETECTED Final   Yersinia enterocolitica NOT DETECTED NOT DETECTED Final   Vibrio species NOT DETECTED NOT DETECTED Final   Vibrio cholerae NOT DETECTED NOT DETECTED Final   Enteroaggregative E coli (EAEC) NOT DETECTED NOT DETECTED Final   Enteropathogenic E coli (EPEC) NOT DETECTED NOT DETECTED Final   Enterotoxigenic E coli (ETEC) NOT DETECTED NOT DETECTED Final   Shiga like toxin producing E coli (STEC) NOT DETECTED NOT DETECTED Final   Shigella/Enteroinvasive E coli (EIEC) NOT DETECTED NOT DETECTED Final   Cryptosporidium NOT DETECTED NOT DETECTED Final   Cyclospora cayetanensis NOT DETECTED NOT DETECTED Final   Entamoeba histolytica NOT DETECTED NOT DETECTED Final   Giardia lamblia NOT DETECTED NOT DETECTED Final   Adenovirus F40/41 NOT DETECTED NOT DETECTED Final   Astrovirus NOT DETECTED NOT DETECTED Final   Norovirus GI/GII NOT DETECTED NOT DETECTED Final   Rotavirus A NOT DETECTED NOT DETECTED Final   Sapovirus (I, II, IV, and V) NOT DETECTED NOT DETECTED Final    Comment: Performed at [facility], [address], [place], [place] [address]  Urine Culture     Status: Abnormal   Collection Time: 03/04/22  9:39 PM   Specimen: In/Out Cath Urine  Result Value Ref Range Status   Specimen Description   Final    IN/OUT CATH URINE Performed at [facility], [address], [place], [place] [address]    Special Requests   Final    NONE Performed at [facility], [address] [place], [place] [address]    Culture MULTIPLE SPECIES PRESENT, SUGGEST RECOLLECTION (A)  Final   Report Status 03/07/2022 FINAL  Final  Culture, blood (Routine X 2) w Reflex to ID Panel     Status: None   Collection Time: 03/06/22  4:50 AM  Specimen: BLOOD  Result Value  Ref Range Status   Specimen Description BLOOD RIGHT ANTECUBITAL  Final   Special Requests   Final    BOTTLES DRAWN AEROBIC AND ANAEROBIC Blood Culture adequate volume   Culture   Final    NO GROWTH 5 DAYS Performed at [facility], [address] [place], [place] [address]    Report Status 03/11/2022 FINAL  Final  Culture, blood (Routine X 2) w Reflex to ID Panel     Status: None   Collection Time: 03/06/22  4:50 AM   Specimen: BLOOD  Result Value Ref Range Status   Specimen Description BLOOD BLOOD RIGHT WRIST  Final   Special Requests   Final    BOTTLES DRAWN AEROBIC AND ANAEROBIC Blood Culture adequate volume   Culture   Final    NO GROWTH 5 DAYS Performed at [facility], [address] [place], [place] [address]    Report Status 03/11/2022 FINAL  Final    Coagulation Studies: No results for input(s): "LABPROT", "INR" in the last 72 hours.   Urinalysis: No results for input(s): "COLORURINE", "LABSPEC", "PHURINE", "GLUCOSEU", "HGBUR", "BILIRUBINUR", "KETONESUR", "PROTEINUR", "UROBILINOGEN", "NITRITE", "LEUKOCYTESUR" in the last 72 hours.  Invalid input(s): "APPERANCEUR"    Imaging: No results found.   Medications:    anticoagulant sodium citrate      (feeding supplement) PROSource Plus  30 mL Oral TID BM   amLODipine  10 mg Oral Daily   aspirin EC  325 mg Oral BID   atorvastatin  10 mg Oral Daily   calcium acetate  667 mg Oral TID WC   carvedilol  6.25 mg Oral BID   Chlorhexidine Gluconate Cloth  6 each Topical Q0600   docusate sodium  100 mg Oral BID   famotidine  20 mg Oral Daily   feeding supplement (NEPRO CARB STEADY)  237 mL Oral BID BM   heparin injection (subcutaneous)  5,000 Units Subcutaneous Q8H   isosorbide mononitrate  30 mg Oral Daily   lactulose  20 g Oral BID   lisinopril  40 mg Oral Daily   multivitamin  1 tablet Oral QHS   acetaminophen, albuterol, alteplase, anticoagulant sodium citrate, bisacodyl, heparin,  hydrALAZINE, HYDROcodone-acetaminophen, lidocaine (PF), lidocaine-prilocaine, magnesium hydroxide, menthol-cetylpyridinium **OR** phenol, metoCLOPramide **OR** metoCLOPramide (REGLAN) injection, morphine injection, ondansetron **OR** ondansetron (ZOFRAN) IV, pentafluoroprop-tetrafluoroeth, traZODone  Assessment/ Plan:  Mr. Torryn Hudspeth is a 61 y.o.  male past medical conditions including type 2 diabetes, hypertension, dyslipidemia, systolic heart failure with EF 45 to 50% and end-stage renal disease on hemodialysis.  Patient presents to the emergency department after suffering a fall with left hip pain.  Patient has been admitted for ESRD (end stage renal disease) on dialysis ([place]) [N18.6, Z99.2] Closed left hip fracture ([place]) [S72.002A] Closed displaced intertrochanteric fracture of left femur, initial encounter ([place]) [S72.142A]  [facility]/TTS/left aVF  End-stage renal disease with hyperkalemia on hemodialysis.  Will maintain outpatient schedule if possible.    Next treatment on Tuesday  2. Anemia of chronic kidney disease Lab Results  Component Value Date   HGB 11.6 (L) 05/02/2022    Patient receives [place] outpatient.  Hemoglobin within desired target.  3. Diabetes mellitus type II with chronic kidney disease/renal manifestations: noninsulin dependent.  Most recent hemoglobin A1c is 5.6 on 04/29/22.   Sliding scale insulin per primary team  4.  Hypertension with chronic kidney disease.  Home regimen includes amlodipine, carvedilol, isosorbide, and lisinopril.Lisinopril currently held during admission.  Blood pressure 156/81  5. Secondary Hyperparathyroidism: with outpatient labs: PTH 759, phosphorus 11.5, calcium 8.6 on 04/22/22.   Lab Results  Component Value Date   PTH 301 (H) 09/24/2020   CALCIUM 8.8 (L) 05/02/2022   CAION 1.21 06/27/2019   PHOS 11.6 (H) 05/01/2022  Calcium within desired target, phosphorus elevated. Continue calcium acetate.      6.Hyperkalemia Potassium 5.1, Will manage with dialysis tomorrow     LOS: 4 [place] 8/28/20232:45 PM

## 2022-05-03 NOTE — Progress Notes (Signed)
  Subjective:  Patient reports pain as well controlled at this time.  He is sitting up in bed eating lunch.  Objective:   VITALS:   Vitals:   05/02/22 1939 05/02/22 2218 05/03/22 0507 05/03/22 0832  BP: 131/74  128/88 (!) 156/81  Pulse: 78  82 80  Resp: 17   19  Temp: 98 F (36.7 C)   98.1 F (36.7 C)  TempSrc:      SpO2: (!) 84% 98% 100% 97%  Weight:      Height:        PHYSICAL EXAM:  Left lower extremity: Dressings are clean dry and intact, compartments are soft, distally neurovascularly intact  LABS  No results found for this or any previous visit (from the past 24 hour(s)).  No results found.  Assessment/Plan: 4 Days Post-Op  Status post left hip IM nail  Principal Problem:   Closed left hip fracture (HCC) Active Problems:   Type 2 diabetes mellitus with hyperlipidemia (HCC)   Essential hypertension   ESRD on dialysis ([place])   Fluid overload   GERD without esophagitis  -Appreciate hospitalist team support/management -PT/OT: Weight-bear as tolerated -VTE prophylaxis: Aspirin -Discharge to SNF pending PT -Follow-up in clinic in approximately 10 to 14 days   Renee Harder , MD 05/03/2022, 12:49 PM

## 2022-05-03 NOTE — Progress Notes (Signed)
PT Cancellation Note  Patient Details Name: Steve Vance MRN: [id number] DOB: 10-Dec-1960   Cancelled Treatment:    Reason Eval/Treat Not Completed: Pain limiting ability to participate;Other (comment)  Offered session around 9:30.  Pt stated he was having nausea and asked me to return later.  Agreed to get up for lunch.  Returned prior to lunch and pt initially agreed.  Went to get clean linens for chair and pt had changed his mind stating he was not going to get up today.  Explained risks/benefits and he stated he was not going to do therapy today.  Again re-enforced need for mobility and praised session yesterday "Listen to me, I am not doing it!"      Chesley Noon 05/03/2022, 11:39 AM

## 2022-05-03 NOTE — Care Management Important Message (Signed)
Important Message  Patient Details  Name: Steve Vance MRN: [id number] Date of Birth: 23-Jan-1961   Medicare Important Message Given:  N/A - LOS <3 / Initial given by admissions     Dannette Barbara 05/03/2022, 10:48 AM

## 2022-05-04 DIAGNOSIS — S72002A Fracture of unspecified part of neck of left femur, initial encounter for closed fracture: Secondary | ICD-10-CM | POA: Diagnosis not present

## 2022-05-04 LAB — RENAL FUNCTION PANEL
Albumin: 3.1 g/dL — ABNORMAL LOW (ref 3.5–5.0)
Anion gap: 15 (ref 5–15)
BUN: 77 mg/dL — ABNORMAL HIGH (ref 8–23)
CO2: 25 mmol/L (ref 22–32)
Calcium: 9.1 mg/dL (ref 8.9–10.3)
Chloride: 93 mmol/L — ABNORMAL LOW (ref 98–111)
Creatinine, Ser: 12.48 mg/dL — ABNORMAL HIGH (ref 0.61–1.24)
GFR, Estimated: 4 mL/min — ABNORMAL LOW (ref >60)
Glucose, Bld: 87 mg/dL (ref 70–99)
Phosphorus: 10.1 mg/dL — ABNORMAL HIGH (ref 2.5–4.6)
Potassium: 5.6 mmol/L — ABNORMAL HIGH (ref 3.5–5.1)
Sodium: 133 mmol/L — ABNORMAL LOW (ref 135–145)

## 2022-05-04 LAB — CBC WITH DIFFERENTIAL/PLATELET
Abs Immature Granulocytes: 0.02 10*3/uL (ref 0.00–0.07)
Basophils Absolute: 0 10*3/uL (ref 0.0–0.1)
Basophils Relative: 0 %
Eosinophils Absolute: 0.4 10*3/uL (ref 0.0–0.5)
Eosinophils Relative: 6 %
HCT: 33.9 % — ABNORMAL LOW (ref 39.0–52.0)
Hemoglobin: 10.8 g/dL — ABNORMAL LOW (ref 13.0–17.0)
Immature Granulocytes: 0 %
Lymphocytes Relative: 16 %
Lymphs Abs: 0.9 10*3/uL (ref 0.7–4.0)
MCH: 28.4 pg (ref 26.0–34.0)
MCHC: 31.9 g/dL (ref 30.0–36.0)
MCV: 89.2 fL (ref 80.0–100.0)
Monocytes Absolute: 0.7 10*3/uL (ref 0.1–1.0)
Monocytes Relative: 13 %
Neutro Abs: 3.7 10*3/uL (ref 1.7–7.7)
Neutrophils Relative %: 65 %
Platelets: 232 10*3/uL (ref 150–400)
RBC: 3.8 MIL/uL — ABNORMAL LOW (ref 4.22–5.81)
RDW: 14.6 % (ref 11.5–15.5)
WBC: 5.7 10*3/uL (ref 4.0–10.5)
nRBC: 0 % (ref 0.0–0.2)

## 2022-05-04 LAB — POTASSIUM: Potassium: 4.3 mmol/L (ref 3.5–5.1)

## 2022-05-04 MED ORDER — NEPRO/CARBSTEADY PO LIQD: 237.0000 mL | Freq: Two times a day (BID) | ORAL | 0 refills | Status: DC

## 2022-05-04 MED ORDER — ASPIRIN 325 MG PO TBEC: 325.0000 mg | DELAYED_RELEASE_TABLET | Freq: Two times a day (BID) | ORAL | 0 refills | Status: AC

## 2022-05-04 MED ORDER — DOCUSATE SODIUM 100 MG PO CAPS: 100.0000 mg | ORAL_CAPSULE | Freq: Two times a day (BID) | ORAL | 0 refills | Status: DC

## 2022-05-04 MED ORDER — RENA-VITE PO TABS: 1.0000 | ORAL_TABLET | Freq: Every day | ORAL | 0 refills | Status: DC

## 2022-05-04 MED ORDER — SODIUM ZIRCONIUM CYCLOSILICATE 10 G PO PACK
10.0000 g | PACK | Freq: Once | ORAL | Status: AC
  Administered 2022-05-04: 10 g via ORAL
  Filled 2022-05-04: qty 1

## 2022-05-04 MED ORDER — ASPIRIN 325 MG PO TBEC: 325.0000 mg | DELAYED_RELEASE_TABLET | Freq: Two times a day (BID) | ORAL | 0 refills | Status: DC

## 2022-05-04 NOTE — Progress Notes (Signed)
Pre HD RN assessment 

## 2022-05-04 NOTE — Progress Notes (Signed)
Hemodialysis patient known at [facility] TTS 10:45am. Patient normally drives himself to treatments. Patient stated no dialysis concerns. Will follow for outpatient dialysis placement.

## 2022-05-04 NOTE — TOC Progression Note (Signed)
Transition of Care [facility]) - Progression Note    Patient Details  Name: Steve Vance MRN: [id number] Date of Birth: 24-Jan-1961  Transition of Care [facility]) CM/SW [place], RN Phone Number: 05/04/2022, 8:51 AM  Clinical Narrative:    The patient goes to [facility] for Dialysis TTS at 1045 AM chair time, I notified Tonya at [facility] care   Expected Discharge Plan: [place] Barriers to Discharge: Continued Medical Work up, Ship broker, SNF Pending bed offer  Expected Discharge Plan and Services Expected Discharge Plan: [place]: [place] arrangements for the past 2 months: Single Family Home                                       Social Determinants of Health (SDOH) Interventions    Readmission Risk Interventions    03/05/2022    8:56 AM 09/25/2020    2:19 PM 09/12/2020    2:14 PM  Readmission Risk Prevention Plan  Transportation Screening Complete Complete Complete  PCP or Specialist Appt within 3-5 Days Complete Complete Complete  HRI or Home Care Consult  Complete Complete  Social Work Consult for [place] Planning/Counseling Complete Complete Complete  Palliative Care Screening Not Applicable Not Applicable Not Applicable  Medication Review Press photographer) Complete Complete Complete

## 2022-05-04 NOTE — Progress Notes (Signed)
Pt ended HD treatment 1.5 hours early AMA d/t wanting to use the bathroom in his room. Refused bedpan. Sherlyn Hay, NP and M. Holley Raring, MD notified. Report to primary RN. Start: 0851 End: 1100 1859ml fluid removed 42.5L BVP No meds w/ HD

## 2022-05-04 NOTE — Progress Notes (Signed)
Post HD RN assessment

## 2022-05-04 NOTE — Progress Notes (Addendum)
Contacted [place] and reported to [employer] LPN.

## 2022-05-04 NOTE — Discharge Summary (Addendum)
Physician Discharge Summary   Patient: Steve Vance MRN: [id number] DOB: December 17, 1960  Admit date:     04/29/2022  Discharge date: 05/04/22  Discharge Physician: Fritzi Mandes   PCP: Theotis Burrow, MD   Recommendations at discharge:   follow-up PCP in 1 to 2 weeks follow-up Dr. Sharlet Salina orthopedic in 1 to 2 weeks resume your hemodialysis outpatient as before  Discharge Diagnoses: Principal Problem:   Closed left hip fracture [facility]) Active Problems:   Fluid overload   ESRD on dialysis [facility])   Type 2 diabetes mellitus with hyperlipidemia [facility])   Essential hypertension   GERD without esophagitis   Hospital Course: Steve Vance is a 61 y.o. African-American male male with medical history significant for end-stage renal disease on hemodialysis on TTS, systolic CHF with EF of 45 to 50%, type 2 diabetes mellitus, hypertension and dyslipidemia, who presented to the ER with acute onset of accidental mechanical fall with subsequent left hip pain.  The patient fell a couple of steps as his foot gave way per his report.   Closed left hip fracture (HCC) mechanical fall -- patient is status post surgery by Dr. Sharlet Salina -start PT OT- recommends rehab --PRN pain meds -- TOC for discharge planning to rehab today -- ASA 325 mg BID per Dr. Sharlet Salina for DVT prophylaxis.  For 14 days  per ortho and then resume ASA 81 mg qd thereafter.  ESRD on dialysis [facility]) Mild hyperkalemia - Nephrology consult with Dr. Candiss Norse Dr. Zollie Scale -- resumed in-house dialysis  -- will give lokelma today  type 2 diabetes mellitus with hyperlipidemia ([place]) - continue SSI, not on any po meds for DM at home - continue statin therapy.   Essential hypertension -- resume home meds   GERD without esophagitis - continue H2 blocker therapy.   Nausea vomiting -- PRN Zofran -- patient did report having a bowel movement today (8/28)   discussed with Dr. Zollie Scale. Patient will be discharging to rehab today.    Procedures: left hip surgery Family communication : none Consults : orthopedic CODE STATUS: full DVT Prophylaxis : asa 325 mg bid --per ortho    Pain control - [place] Controlled Substance Reporting System database was reviewed. and patient was instructed, not to drive, operate heavy machinery, perform activities at heights, swimming or participation in water activities or provide baby-sitting services while on Pain, Sleep and Anxiety Medications; until their outpatient Physician has advised to do so again. Also recommended to not to take more than prescribed Pain, Sleep and Anxiety Medications.  Disposition: Rehabilitation facility Diet recommendation:  Discharge Diet Orders (From admission, onward)     Start     Ordered   05/04/22 0000  Diet - low sodium heart healthy        05/04/22 1221           Renal diet DISCHARGE MEDICATION: Allergies as of 05/04/2022       Reactions   Baclofen Nausea And Vomiting   Gabapentin Nausea And Vomiting   Oxycodone-acetaminophen Nausea And Vomiting        Medication List     TAKE these medications    Accu-Chek FastClix Lancets Misc USE TO CHECK BLOOD SUGAR UP TO 4 TIMES DAILY AS DIRECTED   albuterol 108 (90 Base) MCG/ACT inhaler Commonly known as: VENTOLIN HFA Inhale 1-2 puffs into the lungs every 4 (four) hours as needed for wheezing or shortness of breath.   amLODipine 10 MG tablet Commonly known as: NORVASC Take 1 tablet (10 mg  total) by mouth daily.   aspirin EC 325 MG tablet Take 1 tablet (325 mg total) by mouth 2 (two) times daily. What changed:  medication strength how much to take when to take this   atorvastatin 10 MG tablet Commonly known as: LIPITOR Take 1 tablet (10 mg total) by mouth daily.   blood glucose meter kit and supplies Kit Dispense based on patient and insurance preference. Use up to four times daily as directed. (FOR ICD-9 250.00, 250.01).   calcium acetate 667 MG capsule Commonly known  as: PHOSLO Take 1 capsule (667 mg total) by mouth 3 (three) times daily with meals.   carvedilol 6.25 MG tablet Commonly known as: COREG Take 1 tablet (6.25 mg total) by mouth 2 (two) times daily.   docusate sodium 100 MG capsule Commonly known as: COLACE Take 1 capsule (100 mg total) by mouth 2 (two) times daily.   famotidine 20 MG tablet Commonly known as: PEPCID Take 1 tablet (20 mg total) by mouth daily.   feeding supplement (NEPRO CARB STEADY) Liqd Take 237 mLs by mouth 2 (two) times daily between meals.   HYDROcodone-acetaminophen 5-325 MG tablet Commonly known as: NORCO/VICODIN Take 1 tablet by mouth every 4 (four) hours as needed for moderate pain.   HYDROcodone-acetaminophen 5-325 MG tablet Commonly known as: NORCO/VICODIN Take 1 tablet by mouth every 4 (four) hours as needed for moderate pain.   isosorbide mononitrate 30 MG 24 hr tablet Commonly known as: IMDUR Take 1 tablet (30 mg total) by mouth daily.   lisinopril 40 MG tablet Commonly known as: ZESTRIL Take 1 tablet (40 mg total) by mouth daily.   multivitamin Tabs tablet Take 1 tablet by mouth at bedtime.   ondansetron 4 MG tablet Commonly known as: Zofran Take 1 tablet (4 mg total) by mouth daily as needed for nausea or vomiting.               Discharge Care Instructions  (From admission, onward)           Start     Ordered   05/04/22 0000  Discharge wound care:       Comments: Reinforce dressing till discontinued   05/04/22 1221            Contact information for follow-up providers     Revelo, Elyse Jarvis, MD. Schedule an appointment as soon as possible for a visit in 1 week(s).   Specialty: Family Medicine Contact information: [address] [place] [address] [phone]         Kate Sable, MD .   Specialties: Cardiology, Radiology Contact information: [place] [address] [phone]         Renee Harder, MD.  Go in 2 week(s).   Specialty: Orthopedic Surgery Why: post hip surgery f/u Contact information: [place] [address] [phone]              Contact information for after-discharge care     [facility] .   Service: Skilled Nursing Contact information: [place] [phone]                    Discharge Exam: Danley Danker Weights   05/01/22 0849 05/01/22 1259 05/04/22 0851  Weight: 102.1 kg 99.1 kg 95.6 kg     Condition at discharge: fair  The results of significant diagnostics from this hospitalization (including imaging,  microbiology, ancillary and laboratory) are listed below for reference.   Imaging Studies: DG HIP UNILAT WITH PELVIS 2-3 VIEWS LEFT  Result Date: 04/29/2022 CLINICAL DATA:  Left hip pinning EXAM: DG HIP (WITH OR WITHOUT PELVIS) 2-3V LEFT COMPARISON:  04/29/2022 FINDINGS: Intraoperative imaging demonstrates internal fixation across the left intertrochanteric fracture. Anatomic alignment. No hardware complicating feature. IMPRESSION: Internal fixation.  No visible complicating feature. Electronically Signed   By: Rolm Baptise M.D.   On: 04/29/2022 19:12   DG C-Arm 1-60 Min-No Report  Result Date: 04/29/2022 Fluoroscopy was utilized by the requesting physician.  No radiographic interpretation.   DG Chest 1 View  Result Date: 04/29/2022 CLINICAL DATA:  Fall, left hip fracture EXAM: CHEST  1 VIEW COMPARISON:  03/04/2022 FINDINGS: Lung volumes are small, but are symmetric and are stable since prior examination. Perihilar and lower lung zone interstitial infiltrates are present, similar to prior examination, suggestive of mild chronic cardiogenic pulmonary edema. Mild cardiomegaly is stable. No pneumothorax or pleural effusion. No acute bone abnormality. IMPRESSION: Mild chronic cardiogenic pulmonary edema.  Stable cardiomegaly. Electronically Signed    By: Fidela Salisbury M.D.   On: 04/29/2022 02:59   DG HIP UNILAT WITH PELVIS 2-3 VIEWS LEFT  Result Date: 04/29/2022 CLINICAL DATA:  Fall, left hip pain EXAM: DG HIP (WITH OR WITHOUT PELVIS) 2-3V LEFT COMPARISON:  None Available. FINDINGS: There is an acute, intratrochanteric fracture of the left hip with 1/2 shaft with lateral displacement and mild override of the distal fracture fragment. Femoral head is still seated within the left acetabulum. Left hip joint space is preserved. Limited evaluation of the right hip is unremarkable. Pelvis is intact. Vascular calcifications are noted. IMPRESSION: Acute, displaced intratrochanteric fracture of the left hip. Electronically Signed   By: Fidela Salisbury M.D.   On: 04/29/2022 02:57    Microbiology: Results for orders placed or performed during the hospital encounter of 03/04/22  Blood Culture (routine x 2)     Status: Abnormal   Collection Time: 03/04/22  1:52 PM   Specimen: BLOOD  Result Value Ref Range Status   Specimen Description   Final    BLOOD RAC Performed at [facility], [address], [place], [place] [address]    Special Requests   Final    BOTTLES DRAWN AEROBIC AND ANAEROBIC BCAV Performed at [facility], [address], [place], [place] [address]    Culture  Setup Time   Final    Organism ID to follow GRAM POSITIVE COCCI IN BOTH AEROBIC AND ANAEROBIC BOTTLES CRITICAL RESULT CALLED TO, READ BACK BY AND VERIFIED WITH: JASON BELEUE _0  ON 03/05/22 SKL GRAM STAIN REVIEWED-AGREE WITH RESULT    Culture (A)  Final    GROUP B STREP(S.AGALACTIAE)ISOLATED STAPHYLOCOCCUS AUREUS CRITICAL RESULT CALLED TO, READ BACK BY AND VERIFIED WITH: B. BIERS PHARMD, AT [phone] 03/07/22 D. VANHOOK Performed at [facility], [place] [address] [place],  [address]    Report Status 03/08/2022 FINAL  Final   Organism ID, Bacteria GROUP B STREP(S.AGALACTIAE)ISOLATED  Final   Organism ID, Bacteria STAPHYLOCOCCUS AUREUS  Final       Susceptibility   Group b strep(s.agalactiae)isolated - MIC*    CLINDAMYCIN <=0.25 SENSITIVE Sensitive     AMPICILLIN <=0.25 SENSITIVE Sensitive     ERYTHROMYCIN <=0.12 SENSITIVE Sensitive     VANCOMYCIN 0.5 SENSITIVE Sensitive     CEFTRIAXONE <=0.12 SENSITIVE Sensitive     LEVOFLOXACIN 1 SENSITIVE Sensitive     PENICILLIN Value in  next row Sensitive      SENSITIVE<=0.06    * GROUP B STREP(S.AGALACTIAE)ISOLATED   Staphylococcus aureus - MIC*    CIPROFLOXACIN Value in next row Sensitive      SENSITIVE<=0.06    ERYTHROMYCIN Value in next row Sensitive      SENSITIVE<=0.06    GENTAMICIN Value in next row Sensitive      SENSITIVE<=0.06    OXACILLIN Value in next row Sensitive      SENSITIVE<=0.06    TETRACYCLINE Value in next row Sensitive      SENSITIVE<=0.06    VANCOMYCIN Value in next row Sensitive      SENSITIVE<=0.06    TRIMETH/SULFA Value in next row Sensitive      SENSITIVE<=0.06    CLINDAMYCIN Value in next row Sensitive      SENSITIVE<=0.06    RIFAMPIN Value in next row Sensitive      SENSITIVE<=0.06    Inducible Clindamycin Value in next row Sensitive      SENSITIVE<=0.06    * STAPHYLOCOCCUS AUREUS  Blood Culture ID Panel (Reflexed)     Status: Abnormal   Collection Time: 03/04/22  1:52 PM  Result Value Ref Range Status   Enterococcus faecalis NOT DETECTED NOT DETECTED Final   Enterococcus Faecium NOT DETECTED NOT DETECTED Final   Listeria monocytogenes NOT DETECTED NOT DETECTED Final   Staphylococcus species NOT DETECTED NOT DETECTED Final   Staphylococcus aureus (BCID) NOT DETECTED NOT DETECTED Final   Staphylococcus epidermidis NOT DETECTED NOT DETECTED Final   Staphylococcus lugdunensis NOT DETECTED NOT DETECTED Final   Streptococcus species DETECTED (A) NOT DETECTED Final    Comment: CRITICAL RESULT CALLED TO, READ BACK BY AND VERIFIED WITH: JASON BELEUE _0  ON 03/05/22 SKL    Streptococcus agalactiae DETECTED (A) NOT DETECTED Final    Comment: CRITICAL  RESULT CALLED TO, READ BACK BY AND VERIFIED WITH: JASON BELEUE _1  ON 03/05/22 SKL    Streptococcus pneumoniae NOT DETECTED NOT DETECTED Final   Streptococcus pyogenes NOT DETECTED NOT DETECTED Final   A.calcoaceticus-baumannii NOT DETECTED NOT DETECTED Final   Bacteroides fragilis NOT DETECTED NOT DETECTED Final   Enterobacterales NOT DETECTED NOT DETECTED Final   Enterobacter cloacae complex NOT DETECTED NOT DETECTED Final   Escherichia coli NOT DETECTED NOT DETECTED Final   Klebsiella aerogenes NOT DETECTED NOT DETECTED Final   Klebsiella oxytoca NOT DETECTED NOT DETECTED Final   Klebsiella pneumoniae NOT DETECTED NOT DETECTED Final   Proteus species NOT DETECTED NOT DETECTED Final   Salmonella species NOT DETECTED NOT DETECTED Final   Serratia marcescens NOT DETECTED NOT DETECTED Final   Haemophilus influenzae NOT DETECTED NOT DETECTED Final   Neisseria meningitidis NOT DETECTED NOT DETECTED Final   Pseudomonas aeruginosa NOT DETECTED NOT DETECTED Final   Stenotrophomonas maltophilia NOT DETECTED NOT DETECTED Final   Candida albicans NOT DETECTED NOT DETECTED Final   Candida auris NOT DETECTED NOT DETECTED Final   Candida glabrata NOT DETECTED NOT DETECTED Final   Candida krusei NOT DETECTED NOT DETECTED Final   Candida parapsilosis NOT DETECTED NOT DETECTED Final   Candida tropicalis NOT DETECTED NOT DETECTED Final   Cryptococcus neoformans/gattii NOT DETECTED NOT DETECTED Final    Comment: Performed at [facility], [address] [place], [place] [address]  Blood Culture (routine x 2)     Status: Abnormal   Collection Time: 03/04/22  1:57 PM   Specimen: BLOOD  Result Value Ref Range Status   Specimen Description BLOOD BLOOD RIGHT FOREARM  Final   Special Requests   Final    BOTTLES DRAWN AEROBIC AND ANAEROBIC Blood Culture results may not be optimal due to an inadequate volume of blood received in culture bottles   Culture  Setup Time   Final    GRAM POSITIVE  COCCI IN BOTH AEROBIC AND ANAEROBIC BOTTLES CRITICAL VALUE NOTED.  VALUE IS CONSISTENT WITH PREVIOUSLY REPORTED AND CALLED VALUE.    Culture (A)  Final    GROUP B STREP(S.AGALACTIAE)ISOLATED STAPHYLOCOCCUS AUREUS CRITICAL RESULT CALLED TO, READ BACK BY AND VERIFIED WITH: B. BIERS PHARMD, AT [phone] 03/07/22 BY D. VANHOOK SUSCEPTIBILITIES PERFORMED ON PREVIOUS CULTURE WITHIN THE LAST 5 DAYS. Performed at [facility], [place] [address] [place], [place] [address]    Report Status 03/08/2022 FINAL  Final  Resp Panel by RT-PCR (Flu A&B, Covid) Anterior Nasal Swab     Status: None   Collection Time: 03/04/22  2:12 PM   Specimen: Anterior Nasal Swab  Result Value Ref Range Status   SARS Coronavirus 2 by RT PCR NEGATIVE NEGATIVE Final    Comment: (NOTE) SARS-CoV-2 target nucleic acids are NOT DETECTED.  The SARS-CoV-2 RNA is generally detectable in upper respiratory specimens during the acute phase of infection. The lowest concentration of SARS-CoV-2 viral copies this assay can detect is 138 copies/mL. A negative result does not preclude SARS-Cov-2 infection and should not be used as the sole basis for treatment or other patient management decisions. A negative result may occur with  improper specimen collection/handling, submission of specimen other than nasopharyngeal swab, presence of viral mutation(s) within the areas targeted by this assay, and inadequate number of viral copies(<138 copies/mL). A negative result must be combined with clinical observations, patient history, and epidemiological information. The expected result is Negative.  Fact Sheet for Patients:  EntrepreneurPulse.com.au  Fact Sheet for Healthcare Providers:  IncredibleEmployment.be  This test is no t yet approved or cleared by the [place] FDA and  has been authorized for detection and/or diagnosis of SARS-CoV-2 by FDA under an Emergency Use Authorization (EUA). This EUA  will remain  in effect (meaning this test can be used) for the duration of the COVID-19 declaration under Section 564(b)(1) of the Act, 21 U.S.C.section 360bbb-3(b)(1), unless the authorization is terminated  or revoked sooner.       Influenza A by PCR NEGATIVE NEGATIVE Final   Influenza B by PCR NEGATIVE NEGATIVE Final    Comment: (NOTE) The Xpert Xpress SARS-CoV-2/FLU/RSV plus assay is intended as an aid in the diagnosis of influenza from Nasopharyngeal swab specimens and should not be used as a sole basis for treatment. Nasal washings and aspirates are unacceptable for Xpert Xpress SARS-CoV-2/FLU/RSV testing.  Fact Sheet for Patients: EntrepreneurPulse.com.au  Fact Sheet for Healthcare Providers: IncredibleEmployment.be  This test is not yet approved or cleared by the [place] FDA and has been authorized for detection and/or diagnosis of SARS-CoV-2 by FDA under an Emergency Use Authorization (EUA). This EUA will remain in effect (meaning this test can be used) for the duration of the COVID-19 declaration under Section 564(b)(1) of the Act, 21 U.S.C. section 360bbb-3(b)(1), unless the authorization is terminated or revoked.  Performed at [facility], [place], [place] [address]   C Difficile Quick Screen w PCR reflex     Status: None   Collection Time: 03/04/22  9:21 PM   Specimen: Stool  Result Value Ref Range Status   C Diff antigen NEGATIVE NEGATIVE Final   C Diff  toxin NEGATIVE NEGATIVE Final   C Diff interpretation No C. difficile detected.  Final    Comment: Performed at [facility], [address] [place], [place] [address]  Gastrointestinal Panel by PCR , Stool     Status: None   Collection Time: 03/04/22  9:21 PM   Specimen: Stool  Result Value Ref Range Status   Campylobacter species NOT DETECTED NOT DETECTED Final   Plesimonas shigelloides NOT DETECTED NOT DETECTED Final    Salmonella species NOT DETECTED NOT DETECTED Final   Yersinia enterocolitica NOT DETECTED NOT DETECTED Final   Vibrio species NOT DETECTED NOT DETECTED Final   Vibrio cholerae NOT DETECTED NOT DETECTED Final   Enteroaggregative E coli (EAEC) NOT DETECTED NOT DETECTED Final   Enteropathogenic E coli (EPEC) NOT DETECTED NOT DETECTED Final   Enterotoxigenic E coli (ETEC) NOT DETECTED NOT DETECTED Final   Shiga like toxin producing E coli (STEC) NOT DETECTED NOT DETECTED Final   Shigella/Enteroinvasive E coli (EIEC) NOT DETECTED NOT DETECTED Final   Cryptosporidium NOT DETECTED NOT DETECTED Final   Cyclospora cayetanensis NOT DETECTED NOT DETECTED Final   Entamoeba histolytica NOT DETECTED NOT DETECTED Final   Giardia lamblia NOT DETECTED NOT DETECTED Final   Adenovirus F40/41 NOT DETECTED NOT DETECTED Final   Astrovirus NOT DETECTED NOT DETECTED Final   Norovirus GI/GII NOT DETECTED NOT DETECTED Final   Rotavirus A NOT DETECTED NOT DETECTED Final   Sapovirus (I, II, IV, and V) NOT DETECTED NOT DETECTED Final    Comment: Performed at [facility], [address] [place], [place] [address]  Urine Culture     Status: Abnormal   Collection Time: 03/04/22  9:39 PM   Specimen: In/Out Cath Urine  Result Value Ref Range Status   Specimen Description   Final    IN/OUT CATH URINE Performed at [facility], [address] [place], [place] [address]    Special Requests   Final    NONE Performed at [facility], [address] [place], [place] [address]    Culture MULTIPLE SPECIES PRESENT, SUGGEST RECOLLECTION (A)  Final   Report Status 03/07/2022 FINAL  Final  Culture, blood (Routine X 2) w Reflex to ID Panel     Status: None   Collection Time: 03/06/22  4:50 AM   Specimen: BLOOD  Result Value Ref Range Status   Specimen Description BLOOD RIGHT ANTECUBITAL  Final   Special Requests   Final    BOTTLES DRAWN AEROBIC AND ANAEROBIC Blood Culture adequate volume    Culture   Final    NO GROWTH 5 DAYS Performed at [facility], [address] [place], [place] [address]    Report Status 03/11/2022 FINAL  Final  Culture, blood (Routine X 2) w Reflex to ID Panel     Status: None   Collection Time: 03/06/22  4:50 AM   Specimen: BLOOD  Result Value Ref Range Status   Specimen Description BLOOD BLOOD RIGHT WRIST  Final   Special Requests   Final    BOTTLES DRAWN AEROBIC AND ANAEROBIC Blood Culture adequate volume   Culture   Final    NO GROWTH 5 DAYS Performed at [facility], [address] [place], [place] [address]    Report Status 03/11/2022 FINAL  Final    Labs: CBC: Recent Labs  Lab 04/29/22 0148 04/29/22 0456 05/01/22 0418 05/02/22 0630 05/04/22 0657  WBC 8.0 7.3 5.0 5.6 5.7  NEUTROABS  --   --   --   --  3.7  HGB 11.5* 12.4* 11.6* 11.6* 10.8*  HCT 36.3* 39.3 36.8* 36.4* 33.9*  MCV 88.8 89.3 88.2 89.9 89.2  PLT 238 211 246 243 445   Basic Metabolic Panel: Recent Labs  Lab 04/29/22 0148 04/29/22 0456 05/01/22 0418 05/02/22 0630 05/04/22 0657  NA 135 136 133* 134* 133*  K 5.8* 5.3* 5.5* 5.1 5.6*  CL 96* 102 95* 94* 93*  CO2 21* 20* _0 GLUCOSE 132* 118* 107* 93 87  BUN 70* 64* 59* 46* 77*  CREATININE 12.40* 11.04* 11.08* 9.03* 12.48*  CALCIUM 8.0* 7.3* 8.6* 8.8* 9.1  PHOS  --   --  11.6*  --  10.1*   Liver Function Tests: Recent Labs  Lab 04/29/22 0148 05/04/22 0657  AST 27  --   ALT 16  --   ALKPHOS 80  --   BILITOT 1.0  --   PROT 8.3*  --   ALBUMIN 3.3* 3.1*   CBG: Recent Labs  Lab 04/29/22 1700 04/29/22 1913  GLUCAP 113* 98    Discharge time spent: greater than 30 minutes.  Signed: Fritzi Mandes, MD [facility] 05/04/2022

## 2022-05-04 NOTE — Plan of Care (Signed)

## 2022-05-04 NOTE — TOC Progression Note (Signed)
Transition of Care [facility]) - Progression Note    Patient Details  Name: Steve Vance MRN: [id number] Date of Birth: 05/17/1961  Transition of Care [facility]) CM/SW Violet, RN Phone Number: 05/04/2022, 1:46 PM  Clinical Narrative:    The patient is going to room 88 at [facility] called and they are in line to transport him  Expected Discharge Plan: [place] Barriers to Discharge: Continued Medical Work up, Ship broker, SNF Pending bed offer  Expected Discharge Plan and Services Expected Discharge Plan: [place] Choice: [place] arrangements for the past 2 months: Single Family Home Expected Discharge Date: 05/04/22                                     Social Determinants of Health (SDOH) Interventions    Readmission Risk Interventions    03/05/2022    8:56 AM 09/25/2020    2:19 PM 09/12/2020    2:14 PM  Readmission Risk Prevention Plan  Transportation Screening Complete Complete Complete  PCP or Specialist Appt within 3-5 Days Complete Complete Complete  HRI or Home Care Consult  Complete Complete  Social Work Consult for [place] Planning/Counseling Complete Complete Complete  Palliative Care Screening Not Applicable Not Applicable Not Applicable  Medication Review Press photographer) Complete Complete Complete

## 2022-05-04 NOTE — Care Management Important Message (Signed)
Important Message  Patient Details  Name: Steve Vance MRN: [id number] Date of Birth: 17-Apr-1961   Medicare Important Message Given:  N/A - LOS <3 / Initial given by admissions     Steve Vance 05/04/2022, 10:02 AM

## 2022-05-04 NOTE — Progress Notes (Signed)
[facility]  ROUNDING NOTE   Subjective:   Steve Vance is a 61 year old male with past medical conditions including type 2 diabetes, hypertension, dyslipidemia, systolic heart failure with EF 45 to 50% and end-stage renal disease on hemodialysis.  Patient presents to the emergency department after suffering a fall with left hip pain.  Patient has been admitted for ESRD (end stage renal disease) on dialysis ([place]) [N18.6, Z99.2] Closed left hip fracture ([place]) [S72.002A] Closed displaced intertrochanteric fracture of left femur, initial encounter [facility]) [S72.142A]  Patient is known to our practice and receives outpatient dialysis treatments at [facility] on a TTS schedule.    UPdate  Patient seen and evaluated during dialysis   HEMODIALYSIS FLOWSHEET:  Blood Flow Rate (mL/min): 350 mL/min Arterial Pressure (mmHg): -110 mmHg Venous Pressure (mmHg): 280 mmHg TMP (mmHg): 15 mmHg Ultrafiltration Rate (mL/min): 1257 mL/min Dialysate Flow Rate (mL/min): 300 ml/min Dialysis Fluid Bolus: Normal Saline Bolus Amount (mL): 100 mL  Complains of pain and soreness in left hip   Objective:  Vital signs in last 24 hours:  Temp:  [97.5 F (36.4 C)-98.1 F (36.7 C)] 97.8 F (36.6 C) (08/29 1100) Pulse Rate:  [66-86] 83 (08/29 1100) Resp:  [15-19] 16 (08/29 1100) BP: (122-165)/(71-90) 141/75 (08/29 1106) SpO2:  [99 %-100 %] 100 % (08/29 1100) Weight:  [95.6 kg] 95.6 kg (08/29 0851)  Weight change:  Filed Weights   05/01/22 0849 05/01/22 1259 05/04/22 0851  Weight: 102.1 kg 99.1 kg 95.6 kg    Intake/Output: No intake/output data recorded.   Intake/Output this shift:  Total I/O In: -  Out: 1800 [Other:1800]  Physical Exam: General: NAD  Head: Normocephalic, atraumatic. Moist oral mucosal membranes  Eyes: Anicteric  Lungs:  Diminished in bases, normal effort, room air  Heart: Regular rate and rhythm  Abdomen:  Soft, nontender, obese  Extremities: None  peripheral edema.  Neurologic: Nonfocal, moving all four extremities  Skin: No lesions.  Left hip surgical dressing.  Access: Left aVF    Basic Metabolic Panel: Recent Labs  Lab 04/29/22 0148 04/29/22 0456 05/01/22 0418 05/02/22 0630 05/04/22 0657  NA 135 136 133* 134* 133*  K 5.8* 5.3* 5.5* 5.1 5.6*  CL 96* 102 95* 94* 93*  CO2 21* 20* $Remov'24 28 25  'cgeXrO$ GLUCOSE 132* 118* 107* 93 87  BUN 70* 64* 59* 46* 77*  CREATININE 12.40* 11.04* 11.08* 9.03* 12.48*  CALCIUM 8.0* 7.3* 8.6* 8.8* 9.1  PHOS  --   --  11.6*  --  10.1*     Liver Function Tests: Recent Labs  Lab 04/29/22 0148 05/04/22 0657  AST 27  --   ALT 16  --   ALKPHOS 80  --   BILITOT 1.0  --   PROT 8.3*  --   ALBUMIN 3.3* 3.1*    No results for input(s): "LIPASE", "AMYLASE" in the last 168 hours. No results for input(s): "AMMONIA" in the last 168 hours.  CBC: Recent Labs  Lab 04/29/22 0148 04/29/22 0456 05/01/22 0418 05/02/22 0630 05/04/22 0657  WBC 8.0 7.3 5.0 5.6 5.7  NEUTROABS  --   --   --   --  3.7  HGB 11.5* 12.4* 11.6* 11.6* 10.8*  HCT 36.3* 39.3 36.8* 36.4* 33.9*  MCV 88.8 89.3 88.2 89.9 89.2  PLT 238 211 246 243 232     Cardiac Enzymes: No results for input(s): "CKTOTAL", "CKMB", "CKMBINDEX", "TROPONINI" in the last 168 hours.  BNP: Invalid input(s): "POCBNP"  CBG: Recent Labs  Lab 04/29/22 1700 04/29/22 1913  GLUCAP 113* 98     Microbiology: Results for orders placed or performed during the hospital encounter of 03/04/22  Blood Culture (routine x 2)     Status: Abnormal   Collection Time: 03/04/22  1:52 PM   Specimen: BLOOD  Result Value Ref Range Status   Specimen Description   Final    BLOOD RAC Performed at [facility], [address], [place], [place] [address]    Special Requests   Final    BOTTLES DRAWN AEROBIC AND ANAEROBIC BCAV Performed at [facility], [address], [place], [place] [address]    Culture  Setup Time   Final    Organism ID to  follow GRAM POSITIVE COCCI IN BOTH AEROBIC AND ANAEROBIC BOTTLES CRITICAL RESULT CALLED TO, READ BACK BY AND VERIFIED WITH: JASON BELEUE $RemoveBefo'[email or web address]$  ON 03/05/22 SKL GRAM STAIN REVIEWED-AGREE WITH RESULT    Culture (A)  Final    GROUP B STREP(S.AGALACTIAE)ISOLATED STAPHYLOCOCCUS AUREUS CRITICAL RESULT CALLED TO, READ BACK BY AND VERIFIED WITH: B. BIERS PHARMD, AT [id number] 03/07/22 D. VANHOOK Performed at [facility], [place] [address], [place], [place] [address]    Report Status 03/08/2022 FINAL  Final   Organism ID, Bacteria GROUP B STREP(S.AGALACTIAE)ISOLATED  Final   Organism ID, Bacteria STAPHYLOCOCCUS AUREUS  Final      Susceptibility   Group b strep(s.agalactiae)isolated - MIC*    CLINDAMYCIN <=0.25 SENSITIVE Sensitive     AMPICILLIN <=0.25 SENSITIVE Sensitive     ERYTHROMYCIN <=0.12 SENSITIVE Sensitive     VANCOMYCIN 0.5 SENSITIVE Sensitive     CEFTRIAXONE <=0.12 SENSITIVE Sensitive     LEVOFLOXACIN 1 SENSITIVE Sensitive     PENICILLIN Value in next row Sensitive      SENSITIVE<=0.06    * GROUP B STREP(S.AGALACTIAE)ISOLATED   Staphylococcus aureus - MIC*    CIPROFLOXACIN Value in next row Sensitive      SENSITIVE<=0.06    ERYTHROMYCIN Value in next row Sensitive      SENSITIVE<=0.06    GENTAMICIN Value in next row Sensitive      SENSITIVE<=0.06    OXACILLIN Value in next row Sensitive      SENSITIVE<=0.06    TETRACYCLINE Value in next row Sensitive      SENSITIVE<=0.06    VANCOMYCIN Value in next row Sensitive      SENSITIVE<=0.06    TRIMETH/SULFA Value in next row Sensitive      SENSITIVE<=0.06    CLINDAMYCIN Value in next row Sensitive      SENSITIVE<=0.06    RIFAMPIN Value in next row Sensitive      SENSITIVE<=0.06    Inducible Clindamycin Value in next row Sensitive      SENSITIVE<=0.06    * STAPHYLOCOCCUS AUREUS  Blood Culture ID Panel (Reflexed)     Status: Abnormal   Collection Time: 03/04/22  1:52 PM  Result Value Ref Range Status   Enterococcus faecalis NOT  DETECTED NOT DETECTED Final   Enterococcus Faecium NOT DETECTED NOT DETECTED Final   Listeria monocytogenes NOT DETECTED NOT DETECTED Final   Staphylococcus species NOT DETECTED NOT DETECTED Final   Staphylococcus aureus (BCID) NOT DETECTED NOT DETECTED Final   Staphylococcus epidermidis NOT DETECTED NOT DETECTED Final   Staphylococcus lugdunensis NOT DETECTED NOT DETECTED Final   Streptococcus species DETECTED (A) NOT DETECTED Final    Comment: CRITICAL RESULT CALLED TO, READ BACK BY AND VERIFIED WITH: JASON BELEUE $RemoveBefo'[email or web address]'[email or web address]$  ON 03/05/22 SKL  Streptococcus agalactiae DETECTED (A) NOT DETECTED Final    Comment: CRITICAL RESULT CALLED TO, READ BACK BY AND VERIFIED WITH: JASON BELEUE $RemoveBefo'[email or web address]'[email or web address]$  ON 03/05/22 SKL    Streptococcus pneumoniae NOT DETECTED NOT DETECTED Final   Streptococcus pyogenes NOT DETECTED NOT DETECTED Final   A.calcoaceticus-baumannii NOT DETECTED NOT DETECTED Final   Bacteroides fragilis NOT DETECTED NOT DETECTED Final   Enterobacterales NOT DETECTED NOT DETECTED Final   Enterobacter cloacae complex NOT DETECTED NOT DETECTED Final   Escherichia coli NOT DETECTED NOT DETECTED Final   Klebsiella aerogenes NOT DETECTED NOT DETECTED Final   Klebsiella oxytoca NOT DETECTED NOT DETECTED Final   Klebsiella pneumoniae NOT DETECTED NOT DETECTED Final   Proteus species NOT DETECTED NOT DETECTED Final   Salmonella species NOT DETECTED NOT DETECTED Final   Serratia marcescens NOT DETECTED NOT DETECTED Final   Haemophilus influenzae NOT DETECTED NOT DETECTED Final   Neisseria meningitidis NOT DETECTED NOT DETECTED Final   Pseudomonas aeruginosa NOT DETECTED NOT DETECTED Final   Stenotrophomonas maltophilia NOT DETECTED NOT DETECTED Final   Candida albicans NOT DETECTED NOT DETECTED Final   Candida auris NOT DETECTED NOT DETECTED Final   Candida glabrata NOT DETECTED NOT DETECTED Final   Candida krusei NOT DETECTED NOT DETECTED Final   Candida parapsilosis NOT DETECTED NOT DETECTED Final    Candida tropicalis NOT DETECTED NOT DETECTED Final   Cryptococcus neoformans/gattii NOT DETECTED NOT DETECTED Final    Comment: Performed at [facility], [address] [place], [place] [address]  Blood Culture (routine x 2)     Status: Abnormal   Collection Time: 03/04/22  1:57 PM   Specimen: BLOOD  Result Value Ref Range Status   Specimen Description BLOOD BLOOD RIGHT FOREARM  Final   Special Requests   Final    BOTTLES DRAWN AEROBIC AND ANAEROBIC Blood Culture results may not be optimal due to an inadequate volume of blood received in culture bottles   Culture  Setup Time   Final    GRAM POSITIVE COCCI IN BOTH AEROBIC AND ANAEROBIC BOTTLES CRITICAL VALUE NOTED.  VALUE IS CONSISTENT WITH PREVIOUSLY REPORTED AND CALLED VALUE.    Culture (A)  Final    GROUP B STREP(S.AGALACTIAE)ISOLATED STAPHYLOCOCCUS AUREUS CRITICAL RESULT CALLED TO, READ BACK BY AND VERIFIED WITH: B. BIERS PHARMD, AT 6060 03/07/22 BY D. VANHOOK SUSCEPTIBILITIES PERFORMED ON PREVIOUS CULTURE WITHIN THE LAST 5 DAYS. Performed at [facility], [place] [address], [place],  [address]    Report Status 03/08/2022 FINAL  Final  Resp Panel by RT-PCR (Flu A&B, Covid) Anterior Nasal Swab     Status: None   Collection Time: 03/04/22  2:12 PM   Specimen: Anterior Nasal Swab  Result Value Ref Range Status   SARS Coronavirus 2 by RT PCR NEGATIVE NEGATIVE Final    Comment: (NOTE) SARS-CoV-2 target nucleic acids are NOT DETECTED.  The SARS-CoV-2 RNA is generally detectable in upper respiratory specimens during the acute phase of infection. The lowest concentration of SARS-CoV-2 viral copies this assay can detect is 138 copies/mL. A negative result does not preclude SARS-Cov-2 infection and should not be used as the sole basis for treatment or other patient management decisions. A negative result may occur with  improper specimen collection/handling, submission of specimen other than nasopharyngeal swab,  presence of viral mutation(s) within the areas targeted by this assay, and inadequate number of viral copies(<138 copies/mL). A negative result must be combined with clinical observations, patient history, and epidemiological information. The expected  result is Negative.  Fact Sheet for Patients:  EntrepreneurPulse.com.au  Fact Sheet for Healthcare Providers:  IncredibleEmployment.be  This test is no t yet approved or cleared by the [place] FDA and  has been authorized for detection and/or diagnosis of SARS-CoV-2 by FDA under an Emergency Use Authorization (EUA). This EUA will remain  in effect (meaning this test can be used) for the duration of the COVID-19 declaration under Section 564(b)(1) of the Act, 21 U.S.C.section 360bbb-3(b)(1), unless the authorization is terminated  or revoked sooner.       Influenza A by PCR NEGATIVE NEGATIVE Final   Influenza B by PCR NEGATIVE NEGATIVE Final    Comment: (NOTE) The Xpert Xpress SARS-CoV-2/FLU/RSV plus assay is intended as an aid in the diagnosis of influenza from Nasopharyngeal swab specimens and should not be used as a sole basis for treatment. Nasal washings and aspirates are unacceptable for Xpert Xpress SARS-CoV-2/FLU/RSV testing.  Fact Sheet for Patients: EntrepreneurPulse.com.au  Fact Sheet for Healthcare Providers: IncredibleEmployment.be  This test is not yet approved or cleared by the [place] FDA and has been authorized for detection and/or diagnosis of SARS-CoV-2 by FDA under an Emergency Use Authorization (EUA). This EUA will remain in effect (meaning this test can be used) for the duration of the COVID-19 declaration under Section 564(b)(1) of the Act, 21 U.S.C. section 360bbb-3(b)(1), unless the authorization is terminated or revoked.  Performed at [facility], [place], [place] [address]   C Difficile  Quick Screen w PCR reflex     Status: None   Collection Time: 03/04/22  9:21 PM   Specimen: Stool  Result Value Ref Range Status   C Diff antigen NEGATIVE NEGATIVE Final   C Diff toxin NEGATIVE NEGATIVE Final   C Diff interpretation No C. difficile detected.  Final    Comment: Performed at [facility], [address] [place], [place] [address]  Gastrointestinal Panel by PCR , Stool     Status: None   Collection Time: 03/04/22  9:21 PM   Specimen: Stool  Result Value Ref Range Status   Campylobacter species NOT DETECTED NOT DETECTED Final   Plesimonas shigelloides NOT DETECTED NOT DETECTED Final   Salmonella species NOT DETECTED NOT DETECTED Final   Yersinia enterocolitica NOT DETECTED NOT DETECTED Final   Vibrio species NOT DETECTED NOT DETECTED Final   Vibrio cholerae NOT DETECTED NOT DETECTED Final   Enteroaggregative E coli (EAEC) NOT DETECTED NOT DETECTED Final   Enteropathogenic E coli (EPEC) NOT DETECTED NOT DETECTED Final   Enterotoxigenic E coli (ETEC) NOT DETECTED NOT DETECTED Final   Shiga like toxin producing E coli (STEC) NOT DETECTED NOT DETECTED Final   Shigella/Enteroinvasive E coli (EIEC) NOT DETECTED NOT DETECTED Final   Cryptosporidium NOT DETECTED NOT DETECTED Final   Cyclospora cayetanensis NOT DETECTED NOT DETECTED Final   Entamoeba histolytica NOT DETECTED NOT DETECTED Final   Giardia lamblia NOT DETECTED NOT DETECTED Final   Adenovirus F40/41 NOT DETECTED NOT DETECTED Final   Astrovirus NOT DETECTED NOT DETECTED Final   Norovirus GI/GII NOT DETECTED NOT DETECTED Final   Rotavirus A NOT DETECTED NOT DETECTED Final   Sapovirus (I, II, IV, and V) NOT DETECTED NOT DETECTED Final    Comment: Performed at [facility], [address], [place], [place] [address]  Urine Culture     Status: Abnormal   Collection Time: 03/04/22  9:39 PM   Specimen: In/Out Cath Urine  Result Value Ref Range  Status   Specimen Description   Final    IN/OUT CATH  URINE Performed at [facility], [address] [place], [place] [address]    Special Requests   Final    NONE Performed at [facility], [address] [place], [place] [address]    Culture MULTIPLE SPECIES PRESENT, SUGGEST RECOLLECTION (A)  Final   Report Status 03/07/2022 FINAL  Final  Culture, blood (Routine X 2) w Reflex to ID Panel     Status: None   Collection Time: 03/06/22  4:50 AM   Specimen: BLOOD  Result Value Ref Range Status   Specimen Description BLOOD RIGHT ANTECUBITAL  Final   Special Requests   Final    BOTTLES DRAWN AEROBIC AND ANAEROBIC Blood Culture adequate volume   Culture   Final    NO GROWTH 5 DAYS Performed at [facility], [address] [place], [place] [address]    Report Status 03/11/2022 FINAL  Final  Culture, blood (Routine X 2) w Reflex to ID Panel     Status: None   Collection Time: 03/06/22  4:50 AM   Specimen: BLOOD  Result Value Ref Range Status   Specimen Description BLOOD BLOOD RIGHT WRIST  Final   Special Requests   Final    BOTTLES DRAWN AEROBIC AND ANAEROBIC Blood Culture adequate volume   Culture   Final    NO GROWTH 5 DAYS Performed at [facility], [address] [place], [place] [address]    Report Status 03/11/2022 FINAL  Final    Coagulation Studies: No results for input(s): "LABPROT", "INR" in the last 72 hours.   Urinalysis: No results for input(s): "COLORURINE", "LABSPEC", "PHURINE", "GLUCOSEU", "HGBUR", "BILIRUBINUR", "KETONESUR", "PROTEINUR", "UROBILINOGEN", "NITRITE", "LEUKOCYTESUR" in the last 72 hours.  Invalid input(s): "APPERANCEUR"    Imaging: No results found.   Medications:    anticoagulant sodium citrate      (feeding supplement) PROSource Plus  30 mL Oral TID BM   amLODipine  10 mg Oral Daily   aspirin EC  325 mg Oral BID   atorvastatin  10 mg Oral Daily   calcium acetate  667 mg Oral TID WC   carvedilol  6.25 mg Oral BID   Chlorhexidine Gluconate Cloth   6 each Topical Q0600   docusate sodium  100 mg Oral BID   famotidine  20 mg Oral Daily   feeding supplement (NEPRO CARB STEADY)  237 mL Oral BID BM   heparin injection (subcutaneous)  5,000 Units Subcutaneous Q8H   isosorbide mononitrate  30 mg Oral Daily   lactulose  20 g Oral BID   lisinopril  40 mg Oral Daily   multivitamin  1 tablet Oral QHS   acetaminophen, albuterol, alteplase, anticoagulant sodium citrate, bisacodyl, heparin, hydrALAZINE, HYDROcodone-acetaminophen, lidocaine (PF), lidocaine-prilocaine, magnesium hydroxide, menthol-cetylpyridinium **OR** phenol, metoCLOPramide **OR** metoCLOPramide (REGLAN) injection, ondansetron **OR** ondansetron (ZOFRAN) IV, pentafluoroprop-tetrafluoroeth, traZODone  Assessment/ Plan:  [name] is a 61 y.o.  male past medical conditions including type 2 diabetes, hypertension, dyslipidemia, systolic heart failure with EF 45 to 50% and end-stage renal disease on hemodialysis.  Patient presents to the emergency department after suffering a fall with left hip pain.  Patient has been admitted for ESRD (end stage renal disease) on dialysis ([place]) [N18.6, Z99.2] Closed left hip fracture ([place]) [S72.002A] Closed displaced intertrochanteric fracture of left femur, initial encounter ([place]) [S72.142A]  [facility]/TTS/left aVF  End-stage renal disease with hyperkalemia on hemodialysis.  Will  maintain outpatient schedule if possible.    Receiving treatment today, UF goal 3-3.5L as tolerated. Next treatment scheduled for Thursday.   Patient signed off early due to needing to have a BM and not wanting to use bedpan.   2. Anemia of chronic kidney disease Lab Results  Component Value Date   HGB 10.8 (L) 05/04/2022    Patient receives [place] outpatient.  Hemoglobin at target.  3. Diabetes mellitus type II with chronic kidney disease/renal manifestations: noninsulin dependent.  Most recent hemoglobin A1c is 5.6 on 04/29/22.   Primary  team to manage sliding scale insulin.  4.  Hypertension with chronic kidney disease.  Home regimen includes amlodipine, carvedilol, isosorbide, and lisinopril.Lisinopril currently held during admission.  Blood pressure 141/75  5. Secondary Hyperparathyroidism: with outpatient labs: PTH 759, phosphorus 11.5, calcium 8.6 on 04/22/22.   Lab Results  Component Value Date   PTH 301 (H) 09/24/2020   CALCIUM 9.1 05/04/2022   CAION 1.21 06/27/2019   PHOS 10.1 (H) 05/04/2022  Phosphorus elevated. Continue calcium acetate.     6.Hyperkalemia Potassium 5.9, will correct with dialysis. Will order potassium this afternoon, since patient signed off early.      LOS: 5   8/29/202312:46 PM

## 2022-05-04 NOTE — Progress Notes (Signed)
PT Cancellation Note  Patient Details Name: Steve Vance MRN: [id number] DOB: 10/21/1960   Cancelled Treatment:     PT attempt. Pt is in HD. Will  continue to follow per current POC.   Willette Pa 05/04/2022, 8:46 AM

## 2022-05-04 NOTE — TOC Progression Note (Signed)
Transition of Care [facility]) - Progression Note    Patient Details  Name: Steve Vance MRN: [id number] Date of Birth: 10-18-1960  Transition of Care [facility]) CM/SW [place], RN Phone Number: 05/04/2022, 11:56 AM  Clinical Narrative:    Ins auth approved, [id number] 8/29-8/31, Tonya aware, he will go to room [facility] at [facility]    Expected Discharge Plan: [place] Barriers to Discharge: Continued Medical Work up, Ship broker, SNF Pending bed offer  Expected Discharge Plan and Services Expected Discharge Plan: [place]: [place] arrangements for the past 2 months: Single Family Home                                       Social Determinants of Health (SDOH) Interventions    Readmission Risk Interventions    03/05/2022    8:56 AM 09/25/2020    2:19 PM 09/12/2020    2:14 PM  Readmission Risk Prevention Plan  Transportation Screening Complete Complete Complete  PCP or Specialist Appt within 3-5 Days Complete Complete Complete  HRI or Home Care Consult  Complete Complete  Social Work Consult for [place] Planning/Counseling Complete Complete Complete  Palliative Care Screening Not Applicable Not Applicable Not Applicable  Medication Review Press photographer) Complete Complete Complete

## 2022-08-07 ENCOUNTER — Other Ambulatory Visit: Payer: Self-pay

## 2022-08-07 ENCOUNTER — Encounter: Payer: Self-pay | Admitting: Emergency Medicine

## 2022-08-07 ENCOUNTER — Observation Stay
Admission: EM | Admit: 2022-08-07 | Discharge: 2022-08-08 | Disposition: A | Discharge status: Home / Self Care | Payer: Medicare Other | Attending: Internal Medicine | Admitting: Internal Medicine

## 2022-08-07 DIAGNOSIS — N186 End stage renal disease: Secondary | ICD-10-CM | POA: Insufficient documentation

## 2022-08-07 DIAGNOSIS — D62 Acute posthemorrhagic anemia: Secondary | ICD-10-CM | POA: Diagnosis not present

## 2022-08-07 DIAGNOSIS — Z79899 Other long term (current) drug therapy: Secondary | ICD-10-CM | POA: Diagnosis not present

## 2022-08-07 DIAGNOSIS — D649 Anemia, unspecified: Secondary | ICD-10-CM

## 2022-08-07 DIAGNOSIS — E1122 Type 2 diabetes mellitus with diabetic chronic kidney disease: Secondary | ICD-10-CM | POA: Insufficient documentation

## 2022-08-07 DIAGNOSIS — Z992 Dependence on renal dialysis: Secondary | ICD-10-CM | POA: Insufficient documentation

## 2022-08-07 DIAGNOSIS — I160 Hypertensive urgency: Secondary | ICD-10-CM | POA: Insufficient documentation

## 2022-08-07 DIAGNOSIS — I509 Heart failure, unspecified: Secondary | ICD-10-CM | POA: Insufficient documentation

## 2022-08-07 DIAGNOSIS — T829XXA Unspecified complication of cardiac and vascular prosthetic device, implant and graft, initial encounter: Secondary | ICD-10-CM

## 2022-08-07 DIAGNOSIS — E785 Hyperlipidemia, unspecified: Secondary | ICD-10-CM | POA: Insufficient documentation

## 2022-08-07 DIAGNOSIS — T82590A Other mechanical complication of surgically created arteriovenous fistula, initial encounter: Secondary | ICD-10-CM | POA: Diagnosis present

## 2022-08-07 DIAGNOSIS — I132 Hypertensive heart and chronic kidney disease with heart failure and with stage 5 chronic kidney disease, or end stage renal disease: Secondary | ICD-10-CM | POA: Insufficient documentation

## 2022-08-07 NOTE — ED Triage Notes (Signed)
First RN Note: pt to ED via POV, states is a TTHS dialysis patient, had full dialysis treatment today. Pt with dialysis fistula to LUE. Pt states has had intermittent bleeding of fistula site since complete dialysis earlier today. Upon arrival, some blood noted to bandage, however no active bleeding noted upon arrival to triage.

## 2022-08-08 ENCOUNTER — Other Ambulatory Visit: Payer: Self-pay | Admitting: Surgery

## 2022-08-08 DIAGNOSIS — Z992 Dependence on renal dialysis: Secondary | ICD-10-CM

## 2022-08-08 DIAGNOSIS — N186 End stage renal disease: Secondary | ICD-10-CM | POA: Diagnosis not present

## 2022-08-08 DIAGNOSIS — I160 Hypertensive urgency: Secondary | ICD-10-CM

## 2022-08-08 DIAGNOSIS — T829XXA Unspecified complication of cardiac and vascular prosthetic device, implant and graft, initial encounter: Secondary | ICD-10-CM | POA: Diagnosis not present

## 2022-08-08 DIAGNOSIS — D62 Acute posthemorrhagic anemia: Secondary | ICD-10-CM

## 2022-08-08 DIAGNOSIS — E785 Hyperlipidemia, unspecified: Secondary | ICD-10-CM | POA: Insufficient documentation

## 2022-08-08 LAB — CBC
HCT: 25.8 % — ABNORMAL LOW (ref 39.0–52.0)
HCT: 27.9 % — ABNORMAL LOW (ref 39.0–52.0)
Hemoglobin: 7.7 g/dL — ABNORMAL LOW (ref 13.0–17.0)
Hemoglobin: 8.4 g/dL — ABNORMAL LOW (ref 13.0–17.0)
MCH: 28.2 pg (ref 26.0–34.0)
MCH: 28.2 pg (ref 26.0–34.0)
MCHC: 29.8 g/dL — ABNORMAL LOW (ref 30.0–36.0)
MCHC: 30.1 g/dL (ref 30.0–36.0)
MCV: 94.5 fL (ref 80.0–100.0)
MCV: 93.6 fL (ref 80.0–100.0)
Platelets: 205 10*3/uL (ref 150–400)
Platelets: 197 10*3/uL (ref 150–400)
RBC: 2.73 MIL/uL — ABNORMAL LOW (ref 4.22–5.81)
RBC: 2.98 MIL/uL — ABNORMAL LOW (ref 4.22–5.81)
RDW: 17 % — ABNORMAL HIGH (ref 11.5–15.5)
RDW: 17.2 % — ABNORMAL HIGH (ref 11.5–15.5)
WBC: 4.1 10*3/uL (ref 4.0–10.5)
WBC: 3.8 10*3/uL — ABNORMAL LOW (ref 4.0–10.5)
nRBC: 0 % (ref 0.0–0.2)
nRBC: 0 % (ref 0.0–0.2)

## 2022-08-08 LAB — PREPARE RBC (CROSSMATCH)

## 2022-08-08 LAB — SAMPLE TO BLOOD BANK

## 2022-08-08 LAB — BASIC METABOLIC PANEL
Anion gap: 13 (ref 5–15)
Anion gap: 12 (ref 5–15)
BUN: 34 mg/dL — ABNORMAL HIGH (ref 8–23)
BUN: 37 mg/dL — ABNORMAL HIGH (ref 8–23)
CO2: 24 mmol/L (ref 22–32)
CO2: 25 mmol/L (ref 22–32)
Calcium: 8.6 mg/dL — ABNORMAL LOW (ref 8.9–10.3)
Calcium: 8.9 mg/dL (ref 8.9–10.3)
Chloride: 104 mmol/L (ref 98–111)
Chloride: 101 mmol/L (ref 98–111)
Creatinine, Ser: 5.91 mg/dL — ABNORMAL HIGH (ref 0.61–1.24)
Creatinine, Ser: 6.41 mg/dL — ABNORMAL HIGH (ref 0.61–1.24)
GFR, Estimated: 10 mL/min — ABNORMAL LOW (ref >60)
GFR, Estimated: 9 mL/min — ABNORMAL LOW (ref >60)
Glucose, Bld: 134 mg/dL — ABNORMAL HIGH (ref 70–99)
Glucose, Bld: 88 mg/dL (ref 70–99)
Potassium: 4.1 mmol/L (ref 3.5–5.1)
Potassium: 4.2 mmol/L (ref 3.5–5.1)
Sodium: 141 mmol/L (ref 135–145)
Sodium: 138 mmol/L (ref 135–145)

## 2022-08-08 LAB — PROTIME-INR
INR: 1.3 — ABNORMAL HIGH (ref 0.8–1.2)
Prothrombin Time: 16.3 seconds — ABNORMAL HIGH (ref 11.4–15.2)

## 2022-08-08 MED ORDER — DOCUSATE SODIUM 100 MG PO CAPS: 100.0000 mg | ORAL_CAPSULE | Freq: Two times a day (BID) | ORAL | 0 refills | Status: DC

## 2022-08-08 MED ORDER — LISINOPRIL 10 MG PO TABS
40.0000 mg | ORAL_TABLET | Freq: Every day | ORAL | Status: DC
  Administered 2022-08-08: 40 mg via ORAL
  Filled 2022-08-08: qty 4

## 2022-08-08 MED ORDER — FAMOTIDINE 20 MG PO TABS: 20.0000 mg | ORAL_TABLET | Freq: Every day | ORAL | 0 refills | Status: DC

## 2022-08-08 MED ORDER — ALBUTEROL SULFATE (2.5 MG/3ML) 0.083% IN NEBU: 2.5000 mg | INHALATION_SOLUTION | RESPIRATORY_TRACT | Status: DC | PRN

## 2022-08-08 MED ORDER — ALBUTEROL SULFATE HFA 108 (90 BASE) MCG/ACT IN AERS: 1.0000 | INHALATION_SPRAY | RESPIRATORY_TRACT | 0 refills | Status: DC | PRN

## 2022-08-08 MED ORDER — DOCUSATE SODIUM 100 MG PO CAPS
100.0000 mg | ORAL_CAPSULE | Freq: Two times a day (BID) | ORAL | Status: DC
  Administered 2022-08-08: 100 mg via ORAL
  Filled 2022-08-08: qty 1

## 2022-08-08 MED ORDER — ACETAMINOPHEN 325 MG PO TABS
650.0000 mg | ORAL_TABLET | Freq: Four times a day (QID) | ORAL | Status: DC | PRN
  Administered 2022-08-08: 650 mg via ORAL
  Filled 2022-08-08: qty 2

## 2022-08-08 MED ORDER — CARVEDILOL 6.25 MG PO TABS: 6.2500 mg | ORAL_TABLET | Freq: Two times a day (BID) | ORAL | 0 refills | Status: DC

## 2022-08-08 MED ORDER — ONDANSETRON HCL 4 MG PO TABS: 4.0000 mg | ORAL_TABLET | Freq: Every day | ORAL | 0 refills | Status: DC | PRN

## 2022-08-08 MED ORDER — FAMOTIDINE 20 MG PO TABS
20.0000 mg | ORAL_TABLET | Freq: Every day | ORAL | Status: DC
  Administered 2022-08-08: 20 mg via ORAL
  Filled 2022-08-08: qty 1

## 2022-08-08 MED ORDER — ONDANSETRON HCL 4 MG/2ML IJ SOLN: 4.0000 mg | Freq: Four times a day (QID) | INTRAMUSCULAR | Status: DC | PRN

## 2022-08-08 MED ORDER — SODIUM CHLORIDE 0.9 % IV SOLN
10.0000 mL/h | Freq: Once | INTRAVENOUS | Status: AC
  Administered 2022-08-08: 10 mL/h via INTRAVENOUS

## 2022-08-08 MED ORDER — ATORVASTATIN CALCIUM 20 MG PO TABS
10.0000 mg | ORAL_TABLET | Freq: Every day | ORAL | Status: DC
  Administered 2022-08-08: 10 mg via ORAL
  Filled 2022-08-08: qty 1

## 2022-08-08 MED ORDER — ISOSORBIDE MONONITRATE ER 60 MG PO TB24
30.0000 mg | ORAL_TABLET | Freq: Every day | ORAL | Status: DC
  Administered 2022-08-08: 30 mg via ORAL
  Filled 2022-08-08: qty 1

## 2022-08-08 MED ORDER — LABETALOL HCL 5 MG/ML IV SOLN
20.0000 mg | INTRAVENOUS | Status: DC | PRN
  Administered 2022-08-08 (×2): 20 mg via INTRAVENOUS
  Filled 2022-08-08 (×2): qty 4

## 2022-08-08 MED ORDER — CARVEDILOL 6.25 MG PO TABS
6.2500 mg | ORAL_TABLET | Freq: Two times a day (BID) | ORAL | Status: DC
  Administered 2022-08-08: 6.25 mg via ORAL
  Filled 2022-08-08: qty 1

## 2022-08-08 MED ORDER — ALBUTEROL SULFATE HFA 108 (90 BASE) MCG/ACT IN AERS: 1.0000 | INHALATION_SPRAY | RESPIRATORY_TRACT | Status: DC | PRN

## 2022-08-08 MED ORDER — DIPHENHYDRAMINE-ZINC ACETATE 2-0.1 % EX CREA: 1.0000 | TOPICAL_CREAM | Freq: Three times a day (TID) | CUTANEOUS | 0 refills | Status: DC | PRN

## 2022-08-08 MED ORDER — CALCIUM ACETATE (PHOS BINDER) 667 MG PO CAPS: 667.0000 mg | ORAL_CAPSULE | Freq: Three times a day (TID) | ORAL | 0 refills | Status: DC

## 2022-08-08 MED ORDER — NYSTATIN 100000 UNIT/GM EX POWD: 1.0000 | Freq: Two times a day (BID) | CUTANEOUS | Status: DC

## 2022-08-08 MED ORDER — ATORVASTATIN CALCIUM 10 MG PO TABS: 10.0000 mg | ORAL_TABLET | Freq: Every day | ORAL | 0 refills | Status: DC

## 2022-08-08 MED ORDER — AMLODIPINE BESYLATE 5 MG PO TABS
10.0000 mg | ORAL_TABLET | Freq: Every day | ORAL | Status: DC
  Administered 2022-08-08: 10 mg via ORAL
  Filled 2022-08-08: qty 2

## 2022-08-08 MED ORDER — ONDANSETRON HCL 4 MG PO TABS: 4.0000 mg | ORAL_TABLET | Freq: Four times a day (QID) | ORAL | Status: DC | PRN

## 2022-08-08 MED ORDER — LISINOPRIL 40 MG PO TABS: 40.0000 mg | ORAL_TABLET | Freq: Every day | ORAL | 0 refills | Status: DC

## 2022-08-08 MED ORDER — AMLODIPINE BESYLATE 10 MG PO TABS: 10.0000 mg | ORAL_TABLET | Freq: Every day | ORAL | 0 refills | Status: AC

## 2022-08-08 MED ORDER — TRANEXAMIC ACID FOR EPISTAXIS
500.0000 mg | Freq: Once | TOPICAL | Status: AC
  Administered 2022-08-08: 500 mg via TOPICAL
  Filled 2022-08-08: qty 10

## 2022-08-08 MED ORDER — TRAZODONE HCL 50 MG PO TABS: 25.0000 mg | ORAL_TABLET | Freq: Every evening | ORAL | Status: DC | PRN

## 2022-08-08 MED ORDER — RENA-VITE PO TABS: 1.0000 | ORAL_TABLET | Freq: Every day | ORAL | Status: DC

## 2022-08-08 MED ORDER — NEPRO/CARBSTEADY PO LIQD: 237.0000 mL | Freq: Two times a day (BID) | ORAL | Status: DC

## 2022-08-08 MED ORDER — MAGNESIUM HYDROXIDE 400 MG/5ML PO SUSP: 30.0000 mL | Freq: Every day | ORAL | Status: DC | PRN

## 2022-08-08 MED ORDER — ISOSORBIDE MONONITRATE ER 30 MG PO TB24: 30.0000 mg | ORAL_TABLET | Freq: Every day | ORAL | 0 refills | Status: DC

## 2022-08-08 MED ORDER — HYDROCODONE-ACETAMINOPHEN 5-325 MG PO TABS
1.0000 | ORAL_TABLET | ORAL | Status: DC | PRN
  Administered 2022-08-08: 1 via ORAL
  Filled 2022-08-08: qty 1

## 2022-08-08 MED ORDER — ONDANSETRON HCL 4 MG PO TABS: 4.0000 mg | ORAL_TABLET | Freq: Every day | ORAL | Status: DC | PRN

## 2022-08-08 MED ORDER — ACETAMINOPHEN 325 MG RE SUPP: 650.0000 mg | Freq: Four times a day (QID) | RECTAL | Status: DC | PRN

## 2022-08-08 MED ORDER — CALCIUM ACETATE (PHOS BINDER) 667 MG PO CAPS
667.0000 mg | ORAL_CAPSULE | Freq: Three times a day (TID) | ORAL | Status: DC
  Administered 2022-08-08 (×2): 667 mg via ORAL
  Filled 2022-08-08 (×3): qty 1

## 2022-08-08 NOTE — ED Notes (Signed)
Pt complaining of difficulty breathing. Oxygen is 95-97% on room air. Pt educated to focus on slow deep breaths.

## 2022-08-08 NOTE — Assessment & Plan Note (Addendum)
-   He just received hemodialysis today and is due on Tuesday. - He has been having bleeding from his left upper extremity AV fistula that will be monitored as mentioned above.

## 2022-08-08 NOTE — Discharge Summary (Signed)
Physician Discharge Summary  Steve Vance VZD:[id number] DOB: Aug 13, 1961 DOA: 08/07/2022  PCP: Theotis Burrow, MD  Admit date: 08/07/2022 Discharge date: 08/08/2022  Admitted From: Home Disposition:  Home  Discharge Condition:Stable CODE STATUS:FULL Diet recommendation: Renal   Brief/Interim Summary: Steve Vance is a 61 y.o. African-American male with medical history significant for type 2 diabetes mellitus, end-stage renal disease on hemodialysis on TTS, CHF, anxiety and anemia of chronic disease, who presented to the emergency room with acute onset of bleeding from his AV fistula in the left upper extremity.  He received hemodialysis on 12/2.  He stated that it started bleeding afterwards.  He was able to get it to stop bleeding at home but it later on started bleeding again.  In the ED it was no longer bleeding.  He was hypertensive on presentation.  Lab work showed hemoglobin 7.7, he was transfused with a unit of PRBC. This morning he remained hypertensive but overall hemodynamically stable.  Bleeding from the fistula has completely stopped.  Nephrology, vascular surgery consulted.  Vascular surgery saw him here and cleared for discharge and planning for fistulogram as an outpatient tomorrow.  Medically stable for discharge today.  Following problems were addressed during exacerbation:  AV fistula bleeding: Vascular surgery evaluated him, cleared for discharge.  Plan for fistulogram as an outpatient tomorrow.  Acute blood loss anemia: Hemoglobin of 7.7 on presentation, given a unit of blood transfusion.  Hemoglobin in the range of 8 today.  ESRD on dialysis: Dialyzed on TTS schedule.  Nephrology was following here.  No need for dialysis today  Hypertension: Remained mostly hypertensive here.  Takes amlodipine, lisinopril, carvedilol, Imdur.  He has not been taking his medications.  Prescription sent to his pharmacy  Hyperlipidemia: Continue statin  Obesity: BMI  33.0  Discharge Diagnoses:  Principal Problem:   Acute blood loss anemia Active Problems:   Hypertensive urgency   End-stage renal disease on hemodialysis (HCC)   Dyslipidemia   Complication of AV dialysis fistula, initial encounter    Discharge Instructions  Discharge Instructions     Diet - low sodium heart healthy   Complete by: As directed    Discharge instructions   Complete by: As directed    1)Please take prescribed medications as instructed 2)Follow up with vascular surgery tomorrow for fistulogram 3)Follow up with your PCP in a week   Increase activity slowly   Complete by: As directed       Allergies as of 08/08/2022       Reactions   Baclofen Nausea And Vomiting   Gabapentin Nausea And Vomiting   Oxycodone-acetaminophen Nausea And Vomiting        Medication List     TAKE these medications    albuterol 108 (90 Base) MCG/ACT inhaler Commonly known as: VENTOLIN HFA Inhale 1-2 puffs into the lungs every 4 (four) hours as needed for wheezing or shortness of breath.   amLODipine 10 MG tablet Commonly known as: NORVASC Take 1 tablet (10 mg total) by mouth daily.   atorvastatin 10 MG tablet Commonly known as: LIPITOR Take 1 tablet (10 mg total) by mouth daily.   calcium acetate 667 MG capsule Commonly known as: PHOSLO Take 1 capsule (667 mg total) by mouth 3 (three) times daily with meals.   carvedilol 6.25 MG tablet Commonly known as: COREG Take 1 tablet (6.25 mg total) by mouth 2 (two) times daily.   diphenhydrAMINE-zinc acetate cream Commonly known as: Benadryl Extra Strength Apply 1 Application topically 3 (three)  times daily as needed for itching.   docusate sodium 100 MG capsule Commonly known as: COLACE Take 1 capsule (100 mg total) by mouth 2 (two) times daily.   famotidine 20 MG tablet Commonly known as: PEPCID Take 1 tablet (20 mg total) by mouth daily.   feeding supplement (NEPRO CARB STEADY) Liqd Take 237 mLs by mouth 2 (two)  times daily between meals.   HYDROcodone-acetaminophen 5-325 MG tablet Commonly known as: NORCO/VICODIN Take 1 tablet by mouth every 4 (four) hours as needed for moderate pain.   isosorbide mononitrate 30 MG 24 hr tablet Commonly known as: IMDUR Take 1 tablet (30 mg total) by mouth daily.   lisinopril 40 MG tablet Commonly known as: ZESTRIL Take 1 tablet (40 mg total) by mouth daily.   multivitamin Tabs tablet Take 1 tablet by mouth at bedtime.   nystatin powder Commonly known as: MYCOSTATIN/NYSTOP Apply 1 Application topically 2 (two) times daily.   ondansetron 4 MG tablet Commonly known as: Zofran Take 1 tablet (4 mg total) by mouth daily as needed for nausea or vomiting.        Follow-up Information     Revelo, Elyse Jarvis, MD. Schedule an appointment as soon as possible for a visit in 1 week(s).   Specialty: Family Medicine Contact information: [address] [place] [address] [phone]                Allergies  Allergen Reactions   Baclofen Nausea And Vomiting   Gabapentin Nausea And Vomiting   Oxycodone-Acetaminophen Nausea And Vomiting    Consultations: Nephrology, vascular surgery   Procedures/Studies: No results found.    Subjective: Patient seen and examined at bedside today.  Hemodynamically stable during evaluation.  Bleeding has stopped completely from IV fistula site.  Very eager to go home.  On room air.  Denies shortness of breath, cough  Discharge Exam: Vitals:   08/08/22 1200 08/08/22 1301  BP: (!) 174/116 (!) 152/77  Pulse: 70 64  Resp: 18 18  Temp: 97.8 F (36.6 C)   SpO2: 100% 94%   Vitals:   08/08/22 0720 08/08/22 0948 08/08/22 1200 08/08/22 1301  BP: (!) 184/107 (!) 172/91 (!) 174/116 (!) 152/77  Pulse: 77  70 64  Resp: $Remo'20  18 18  'ePDZF$ Temp: 98 F (36.7 C)  97.8 F (36.6 C)   TempSrc: Oral     SpO2: 99%  100% 94%  Weight:      Height:        General: Pt is alert, awake, not in acute  distress Cardiovascular: RRR, S1/S2 +, no rubs, no gallops Respiratory: CTA bilaterally, no wheezing, no rhonchi Abdominal: Soft, NT, ND, bowel sounds + Extremities: no edema, no cyanosis, AV fistula on the left upper extremity    The results of significant diagnostics from this hospitalization (including imaging, microbiology, ancillary and laboratory) are listed below for reference.     Microbiology: No results found for this or any previous visit (from the past 240 hour(s)).   Labs: BNP (last 3 results) Recent Labs    08/14/21 0902 03/04/22 1428  BNP 917.1* 469.6*   Basic Metabolic Panel: Recent Labs  Lab 08/07/22 2315 08/08/22 0656  NA 141 138  K 4.1 4.2  CL 104 101  CO2 24 25  GLUCOSE 134* 88  BUN 34* 37*  CREATININE 5.91* 6.41*  CALCIUM 8.6* 8.9   Liver Function Tests: No results for input(s): "AST", "ALT", "ALKPHOS", "BILITOT", "PROT", "ALBUMIN" in the last  168 hours. No results for input(s): "LIPASE", "AMYLASE" in the last 168 hours. No results for input(s): "AMMONIA" in the last 168 hours. CBC: Recent Labs  Lab 08/07/22 2315 08/08/22 0656  WBC 4.1 3.8*  HGB 7.7* 8.4*  HCT 25.8* 27.9*  MCV 94.5 93.6  PLT 205 197   Cardiac Enzymes: No results for input(s): "CKTOTAL", "CKMB", "CKMBINDEX", "TROPONINI" in the last 168 hours. BNP: Invalid input(s): "POCBNP" CBG: No results for input(s): "GLUCAP" in the last 168 hours. D-Dimer No results for input(s): "DDIMER" in the last 72 hours. Hgb A1c No results for input(s): "HGBA1C" in the last 72 hours. Lipid Profile No results for input(s): "CHOL", "HDL", "LDLCALC", "TRIG", "CHOLHDL", "LDLDIRECT" in the last 72 hours. Thyroid function studies No results for input(s): "TSH", "T4TOTAL", "T3FREE", "THYROIDAB" in the last 72 hours.  Invalid input(s): "FREET3" Anemia work up No results for input(s): "VITAMINB12", "FOLATE", "FERRITIN", "TIBC", "IRON", "RETICCTPCT" in the last 72 hours. Urinalysis     Component Value Date/Time   COLORURINE YELLOW (A) 03/04/2022 2139   APPEARANCEUR CLOUDY (A) 03/04/2022 2139   APPEARANCEUR Cloudy (A) 07/30/2019 1301   LABSPEC 1.016 03/04/2022 2139   PHURINE 7.0 03/04/2022 2139   GLUCOSEU 50 (A) 03/04/2022 2139   HGBUR MODERATE (A) 03/04/2022 2139   BILIRUBINUR NEGATIVE 03/04/2022 2139   BILIRUBINUR Negative 07/30/2019 1301   KETONESUR NEGATIVE 03/04/2022 2139   PROTEINUR >=300 (A) 03/04/2022 2139   NITRITE NEGATIVE 03/04/2022 2139   LEUKOCYTESUR MODERATE (A) 03/04/2022 2139   Sepsis Labs Recent Labs  Lab 08/07/22 2315 08/08/22 0656  WBC 4.1 3.8*   Microbiology No results found for this or any previous visit (from the past 240 hour(s)).  Please note: You were cared for by a hospitalist during your hospital stay. Once you are discharged, your primary care physician will handle any further medical issues. Please note that NO REFILLS for any discharge medications will be authorized once you are discharged, as it is imperative that you return to your primary care physician (or establish a relationship with a primary care physician if you do not have one) for your post hospital discharge needs so that they can reassess your need for medications and monitor your lab values.    Time coordinating discharge: 40 minutes  SIGNED:   Shelly Coss, MD  [facility] 08/08/2022, 1:22 PM Pager [phone]  If 7PM-7AM, please contact night-coverage [email or web address] Password [email or web address]

## 2022-08-08 NOTE — Progress Notes (Signed)
[facility]  ROUNDING NOTE   Subjective:   Steve Vance is a 61 year old male with past medical conditions including type 2 diabetes, hypertension, dyslipidemia, systolic heart failure with EF 45 to 50% and end-stage renal disease on hemodialysis. Patient presents to the emergency department with bleeding from his HD access. Patient has been admitted under observation for Acute blood loss anemia [D62]  Patient is known to our practice and receives outpatient dialysis treatments at [facility] on a TTS schedule, supervised by Dr. Candiss Norse.  Last dialysis treatment completed on Saturday, no complications.  Patient states he had intermittent bleeding from his access since the completion of dialysis on Saturday.  Unsure of reason, states he ambulates with a walker.  Was not sure if he was applying too much pressure.  Patient has required ambulatory assistance with a walker for the past few months now.  No active bleeding noted at this time.  Labs on ED arrival showed glucose 134, BUN 34, creatinine 5.91 with GFR 10, calcium 8.6, and hemoglobin 7.7.  Vascular has been consulted to evaluate HD access.  We were consulted to continue dialysis if needed this admission.  Objective:  Vital signs in last 24 hours:  Temp:  [97.8 F (36.6 C)-98.9 F (37.2 C)] 97.8 F (36.6 C) (12/03 1200) Pulse Rate:  [70-97] 70 (12/03 1200) Resp:  [16-21] 18 (12/03 1200) BP: (166-188)/(82-116) 174/116 (12/03 1200) SpO2:  [94 %-100 %] 100 % (12/03 1200) Weight:  [95.6 kg] 95.6 kg (12/02 2304)  Weight change:  Filed Weights   08/07/22 2304  Weight: 95.6 kg    Intake/Output: I/O last 3 completed shifts: In: 40 [I.V.:40] Out: -    Intake/Output this shift:  No intake/output data recorded.  Physical Exam: General: NAD  Head: Normocephalic, atraumatic. Moist oral mucosal membranes  Eyes: Anicteric  Lungs:  Clear to auscultation, normal effort, room air  Heart: Regular rate and rhythm   Abdomen:  Soft, nontender  Extremities:  2+ nonpitting  peripheral edema.  Neurologic: Alert and oriented  Skin: No lesions  Access: Lt AVF    Basic Metabolic Panel: Recent Labs  Lab 08/07/22 2315 08/08/22 0656  NA 141 138  K 4.1 4.2  CL 104 101  CO2 24 25  GLUCOSE 134* 88  BUN 34* 37*  CREATININE 5.91* 6.41*  CALCIUM 8.6* 8.9    Liver Function Tests: No results for input(s): "AST", "ALT", "ALKPHOS", "BILITOT", "PROT", "ALBUMIN" in the last 168 hours. No results for input(s): "LIPASE", "AMYLASE" in the last 168 hours. No results for input(s): "AMMONIA" in the last 168 hours.  CBC: Recent Labs  Lab 08/07/22 2315 08/08/22 0656  WBC 4.1 3.8*  HGB 7.7* 8.4*  HCT 25.8* 27.9*  MCV 94.5 93.6  PLT 205 197    Cardiac Enzymes: No results for input(s): "CKTOTAL", "CKMB", "CKMBINDEX", "TROPONINI" in the last 168 hours.  BNP: Invalid input(s): "POCBNP"  CBG: No results for input(s): "GLUCAP" in the last 168 hours.  Microbiology: Results for orders placed or performed during the hospital encounter of 03/04/22  Blood Culture (routine x 2)     Status: Abnormal   Collection Time: 03/04/22  1:52 PM   Specimen: BLOOD  Result Value Ref Range Status   Specimen Description   Final    BLOOD RAC Performed at [facility], [address] [place], [place] [address]    Special Requests   Final    BOTTLES DRAWN AEROBIC AND ANAEROBIC BCAV Performed at [facility],  [place], [place] [address]    Culture  Setup Time   Final    Organism ID to follow GRAM POSITIVE COCCI IN BOTH AEROBIC AND ANAEROBIC BOTTLES CRITICAL RESULT CALLED TO, READ BACK BY AND VERIFIED WITH: JASON BELEUE $RemoveBefo'[email or web address]$  ON 03/05/22 SKL GRAM STAIN REVIEWED-AGREE WITH RESULT    Culture (A)  Final    GROUP B STREP(S.AGALACTIAE)ISOLATED STAPHYLOCOCCUS AUREUS CRITICAL RESULT CALLED TO, READ BACK BY AND VERIFIED WITH: B. BIERS PHARMD, AT 1572 03/07/22 D. VANHOOK Performed at [facility], [place] [address], [place], [place] [address]    Report Status 03/08/2022 FINAL  Final   Organism ID, Bacteria GROUP B STREP(S.AGALACTIAE)ISOLATED  Final   Organism ID, Bacteria STAPHYLOCOCCUS AUREUS  Final      Susceptibility   Group b strep(s.agalactiae)isolated - MIC*    CLINDAMYCIN <=0.25 SENSITIVE Sensitive     AMPICILLIN <=0.25 SENSITIVE Sensitive     ERYTHROMYCIN <=0.12 SENSITIVE Sensitive     VANCOMYCIN 0.5 SENSITIVE Sensitive     CEFTRIAXONE <=0.12 SENSITIVE Sensitive     LEVOFLOXACIN 1 SENSITIVE Sensitive     PENICILLIN Value in next row Sensitive      SENSITIVE<=0.06    * GROUP B STREP(S.AGALACTIAE)ISOLATED   Staphylococcus aureus - MIC*    CIPROFLOXACIN Value in next row Sensitive      SENSITIVE<=0.06    ERYTHROMYCIN Value in next row Sensitive      SENSITIVE<=0.06    GENTAMICIN Value in next row Sensitive      SENSITIVE<=0.06    OXACILLIN Value in next row Sensitive      SENSITIVE<=0.06    TETRACYCLINE Value in next row Sensitive      SENSITIVE<=0.06    VANCOMYCIN Value in next row Sensitive      SENSITIVE<=0.06    TRIMETH/SULFA Value in next row Sensitive      SENSITIVE<=0.06    CLINDAMYCIN Value in next row Sensitive      SENSITIVE<=0.06    RIFAMPIN Value in next row Sensitive      SENSITIVE<=0.06    Inducible Clindamycin Value in next row Sensitive      SENSITIVE<=0.06    * STAPHYLOCOCCUS AUREUS  Blood Culture ID Panel (Reflexed)     Status: Abnormal   Collection Time: 03/04/22  1:52 PM  Result Value Ref Range Status   Enterococcus faecalis NOT DETECTED NOT DETECTED Final   Enterococcus Faecium NOT DETECTED NOT DETECTED Final   Listeria monocytogenes NOT DETECTED NOT DETECTED Final   Staphylococcus species NOT DETECTED NOT DETECTED Final   Staphylococcus aureus (BCID) NOT DETECTED NOT DETECTED Final   Staphylococcus epidermidis NOT DETECTED NOT DETECTED Final   Staphylococcus lugdunensis NOT DETECTED NOT DETECTED Final   Streptococcus species  DETECTED (A) NOT DETECTED Final    Comment: CRITICAL RESULT CALLED TO, READ BACK BY AND VERIFIED WITH: JASON BELEUE $RemoveBefo'[email or web address]$  ON 03/05/22 SKL    Streptococcus agalactiae DETECTED (A) NOT DETECTED Final    Comment: CRITICAL RESULT CALLED TO, READ BACK BY AND VERIFIED WITH: JASON BELEUE $RemoveBefo'[id number]$  ON 03/05/22 SKL    Streptococcus pneumoniae NOT DETECTED NOT DETECTED Final   Streptococcus pyogenes NOT DETECTED NOT DETECTED Final   A.calcoaceticus-baumannii NOT DETECTED NOT DETECTED Final   Bacteroides fragilis NOT DETECTED NOT DETECTED Final   Enterobacterales NOT DETECTED NOT DETECTED Final   Enterobacter cloacae complex NOT DETECTED NOT DETECTED Final   Escherichia coli NOT DETECTED NOT DETECTED Final   Klebsiella aerogenes NOT DETECTED NOT DETECTED Final   Klebsiella  oxytoca NOT DETECTED NOT DETECTED Final   Klebsiella pneumoniae NOT DETECTED NOT DETECTED Final   Proteus species NOT DETECTED NOT DETECTED Final   Salmonella species NOT DETECTED NOT DETECTED Final   Serratia marcescens NOT DETECTED NOT DETECTED Final   Haemophilus influenzae NOT DETECTED NOT DETECTED Final   Neisseria meningitidis NOT DETECTED NOT DETECTED Final   Pseudomonas aeruginosa NOT DETECTED NOT DETECTED Final   Stenotrophomonas maltophilia NOT DETECTED NOT DETECTED Final   Candida albicans NOT DETECTED NOT DETECTED Final   Candida auris NOT DETECTED NOT DETECTED Final   Candida glabrata NOT DETECTED NOT DETECTED Final   Candida krusei NOT DETECTED NOT DETECTED Final   Candida parapsilosis NOT DETECTED NOT DETECTED Final   Candida tropicalis NOT DETECTED NOT DETECTED Final   Cryptococcus neoformans/gattii NOT DETECTED NOT DETECTED Final    Comment: Performed at [facility], [address] [place], [place] [address]  Blood Culture (routine x 2)     Status: Abnormal   Collection Time: 03/04/22  1:57 PM   Specimen: BLOOD  Result Value Ref Range Status   Specimen Description BLOOD BLOOD RIGHT FOREARM  Final    Special Requests   Final    BOTTLES DRAWN AEROBIC AND ANAEROBIC Blood Culture results may not be optimal due to an inadequate volume of blood received in culture bottles   Culture  Setup Time   Final    GRAM POSITIVE COCCI IN BOTH AEROBIC AND ANAEROBIC BOTTLES CRITICAL VALUE NOTED.  VALUE IS CONSISTENT WITH PREVIOUSLY REPORTED AND CALLED VALUE.    Culture (A)  Final    GROUP B STREP(S.AGALACTIAE)ISOLATED STAPHYLOCOCCUS AUREUS CRITICAL RESULT CALLED TO, READ BACK BY AND VERIFIED WITH: B. BIERS PHARMD, AT 9233 03/07/22 BY D. VANHOOK SUSCEPTIBILITIES PERFORMED ON PREVIOUS CULTURE WITHIN THE LAST 5 DAYS. Performed at [facility], [place] [address] [place], [place] [address]    Report Status 03/08/2022 FINAL  Final  Resp Panel by RT-PCR (Flu A&B, Covid) Anterior Nasal Swab     Status: None   Collection Time: 03/04/22  2:12 PM   Specimen: Anterior Nasal Swab  Result Value Ref Range Status   SARS Coronavirus 2 by RT PCR NEGATIVE NEGATIVE Final    Comment: (NOTE) SARS-CoV-2 target nucleic acids are NOT DETECTED.  The SARS-CoV-2 RNA is generally detectable in upper respiratory specimens during the acute phase of infection. The lowest concentration of SARS-CoV-2 viral copies this assay can detect is 138 copies/mL. A negative result does not preclude SARS-Cov-2 infection and should not be used as the sole basis for treatment or other patient management decisions. A negative result may occur with  improper specimen collection/handling, submission of specimen other than nasopharyngeal swab, presence of viral mutation(s) within the areas targeted by this assay, and inadequate number of viral copies(<138 copies/mL). A negative result must be combined with clinical observations, patient history, and epidemiological information. The expected result is Negative.  Fact Sheet for Patients:  EntrepreneurPulse.com.au  Fact Sheet for Healthcare Providers:   IncredibleEmployment.be  This test is no t yet approved or cleared by the [place] FDA and  has been authorized for detection and/or diagnosis of SARS-CoV-2 by FDA under an Emergency Use Authorization (EUA). This EUA will remain  in effect (meaning this test can be used) for the duration of the COVID-19 declaration under Section 564(b)(1) of the Act, 21 U.S.C.section 360bbb-3(b)(1), unless the authorization is terminated  or revoked sooner.       Influenza A by PCR NEGATIVE NEGATIVE  Final   Influenza B by PCR NEGATIVE NEGATIVE Final    Comment: (NOTE) The Xpert Xpress SARS-CoV-2/FLU/RSV plus assay is intended as an aid in the diagnosis of influenza from Nasopharyngeal swab specimens and should not be used as a sole basis for treatment. Nasal washings and aspirates are unacceptable for Xpert Xpress SARS-CoV-2/FLU/RSV testing.  Fact Sheet for Patients: EntrepreneurPulse.com.au  Fact Sheet for Healthcare Providers: IncredibleEmployment.be  This test is not yet approved or cleared by the [place] FDA and has been authorized for detection and/or diagnosis of SARS-CoV-2 by FDA under an Emergency Use Authorization (EUA). This EUA will remain in effect (meaning this test can be used) for the duration of the COVID-19 declaration under Section 564(b)(1) of the Act, 21 U.S.C. section 360bbb-3(b)(1), unless the authorization is terminated or revoked.  Performed at [facility], [place], [place] [address]   C Difficile Quick Screen w PCR reflex     Status: None   Collection Time: 03/04/22  9:21 PM   Specimen: Stool  Result Value Ref Range Status   C Diff antigen NEGATIVE NEGATIVE Final   C Diff toxin NEGATIVE NEGATIVE Final   C Diff interpretation No C. difficile detected.  Final    Comment: Performed at [facility], [address] [place], [place] [address]  Gastrointestinal Panel  by PCR , Stool     Status: None   Collection Time: 03/04/22  9:21 PM   Specimen: Stool  Result Value Ref Range Status   Campylobacter species NOT DETECTED NOT DETECTED Final   Plesimonas shigelloides NOT DETECTED NOT DETECTED Final   Salmonella species NOT DETECTED NOT DETECTED Final   Yersinia enterocolitica NOT DETECTED NOT DETECTED Final   Vibrio species NOT DETECTED NOT DETECTED Final   Vibrio cholerae NOT DETECTED NOT DETECTED Final   Enteroaggregative E coli (EAEC) NOT DETECTED NOT DETECTED Final   Enteropathogenic E coli (EPEC) NOT DETECTED NOT DETECTED Final   Enterotoxigenic E coli (ETEC) NOT DETECTED NOT DETECTED Final   Shiga like toxin producing E coli (STEC) NOT DETECTED NOT DETECTED Final   Shigella/Enteroinvasive E coli (EIEC) NOT DETECTED NOT DETECTED Final   Cryptosporidium NOT DETECTED NOT DETECTED Final   Cyclospora cayetanensis NOT DETECTED NOT DETECTED Final   Entamoeba histolytica NOT DETECTED NOT DETECTED Final   Giardia lamblia NOT DETECTED NOT DETECTED Final   Adenovirus F40/41 NOT DETECTED NOT DETECTED Final   Astrovirus NOT DETECTED NOT DETECTED Final   Norovirus GI/GII NOT DETECTED NOT DETECTED Final   Rotavirus A NOT DETECTED NOT DETECTED Final   Sapovirus (I, II, IV, and V) NOT DETECTED NOT DETECTED Final    Comment: Performed at [facility], [address], [place], [place] [address]  Urine Culture     Status: Abnormal   Collection Time: 03/04/22  9:39 PM   Specimen: In/Out Cath Urine  Result Value Ref Range Status   Specimen Description   Final    IN/OUT CATH URINE Performed at [facility], [address] [place],  [address]    Special Requests   Final    NONE Performed at [facility], [address] [place],  [address]    Culture MULTIPLE SPECIES PRESENT, SUGGEST RECOLLECTION (A)  Final   Report Status 03/07/2022 FINAL  Final  Culture, blood (Routine X 2) w Reflex to ID Panel     Status: None    Collection Time: 03/06/22  4:50 AM   Specimen: BLOOD  Result  Value Ref Range Status   Specimen Description BLOOD RIGHT ANTECUBITAL  Final   Special Requests   Final    BOTTLES DRAWN AEROBIC AND ANAEROBIC Blood Culture adequate volume   Culture   Final    NO GROWTH 5 DAYS Performed at [facility], [address] [place], [place] [phone]    Report Status 03/11/2022 FINAL  Final  Culture, blood (Routine X 2) w Reflex to ID Panel     Status: None   Collection Time: 03/06/22  4:50 AM   Specimen: BLOOD  Result Value Ref Range Status   Specimen Description BLOOD BLOOD RIGHT WRIST  Final   Special Requests   Final    BOTTLES DRAWN AEROBIC AND ANAEROBIC Blood Culture adequate volume   Culture   Final    NO GROWTH 5 DAYS Performed at [facility], [address] [place], [place] [address]    Report Status 03/11/2022 FINAL  Final    Coagulation Studies: Recent Labs    08/07/22 2323  LABPROT 16.3*  INR 1.3*    Urinalysis: No results for input(s): "COLORURINE", "LABSPEC", "PHURINE", "GLUCOSEU", "HGBUR", "BILIRUBINUR", "KETONESUR", "PROTEINUR", "UROBILINOGEN", "NITRITE", "LEUKOCYTESUR" in the last 72 hours.  Invalid input(s): "APPERANCEUR"    Imaging: No results found.   Medications:     amLODipine  10 mg Oral Daily   atorvastatin  10 mg Oral Daily   calcium acetate  667 mg Oral TID WC   carvedilol  6.25 mg Oral BID   docusate sodium  100 mg Oral BID   famotidine  20 mg Oral Daily   feeding supplement (NEPRO CARB STEADY)  237 mL Oral BID BM   isosorbide mononitrate  30 mg Oral Daily   lisinopril  40 mg Oral Daily   multivitamin  1 tablet Oral QHS   acetaminophen **OR** acetaminophen, albuterol, HYDROcodone-acetaminophen, labetalol, magnesium hydroxide, ondansetron **OR** ondansetron (ZOFRAN) IV, traZODone  Assessment/ Plan:  [name] is a 61 y.o.  male is a 61 year old male with past medical conditions including type 2 diabetes,  hypertension, dyslipidemia, systolic heart failure with EF 45 to 50% and end-stage renal disease on hemodialysis. Patient presents to the emergency department   [place]/TTS/left aVF/95kg   Anemia of chronic kidney disease/acute blood loss: [place] [facility]  Lab Results  Component Value Date   HGB 8.4 (L) 08/08/2022   Hemoglobin 7.7 on admission.  Patient reports bleeding from HD access on ED arrival.  Patient received 1 unit blood transfusion.  2.  Malfunctioning HD access.  Patient reports excessive bleeding since dialysis completion yesterday.  No bleeding noted on ED arrival.  Vascular consulted for evaluation.  If discharged, will arrange vascular follow-up outpatient.  3.  End-stage renal disease on hemodialysis.  Last treatment completed on Saturday.  Next treatment scheduled for Tuesday.  4. Secondary Hyperparathyroidism: with outpatient labs: PTH 759, phosphorus 11.5, calcium 8.6 on 04/20/22.   Lab Results  Component Value Date   PTH 301 (H) 09/24/2020   CALCIUM 8.9 08/08/2022   CAION 1.21 06/27/2019   PHOS 10.1 (H) 05/04/2022    Currently receiving calcium acetate with meals outpatient.   LOS: 0   12/3/202312:41 PM

## 2022-08-08 NOTE — Assessment & Plan Note (Signed)
-   We will continue statin therapy. 
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
## 2022-08-08 NOTE — Assessment & Plan Note (Signed)
-   We will continue his antihypertensives and place him on as needed IV labetalol.

## 2022-08-08 NOTE — ED Notes (Signed)
Pt noted to have dressing to fistula on the left arm. Positive thrill noted. Pt states back in September he broke his left hip. Pt alert and oriented. Pt is declining carvedilol at this time. Pt states he needs his BP medications but wont take them without food.
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
## 2022-08-08 NOTE — Assessment & Plan Note (Signed)
-   This is secondary to bleeding from his left upper extremity AV fistula and is on top of anemia of chronic disease from his ESRD. - He will be admitted to an observation medical telemetry bed. - He was typed and crossmatched and will be transfused one 8 unit of packed red blood cells. - We will follow posttransfusion H&H. - We will monitor for recurrent bleeding. -Nephrology consult can be obtained in a.m. as needed.

## 2022-08-08 NOTE — ED Notes (Signed)
Pt states breathing is better and no s/s of distress noted.

## 2022-08-08 NOTE — ED Notes (Signed)
Provider at bedside. Provider aware of elevated BP

## 2022-08-09 ENCOUNTER — Other Ambulatory Visit: Payer: Self-pay

## 2022-08-09 ENCOUNTER — Telehealth (INDEPENDENT_AMBULATORY_CARE_PROVIDER_SITE_OTHER): Payer: Self-pay

## 2022-08-09 ENCOUNTER — Ambulatory Visit
Admission: RE | Admit: 2022-08-09 | Discharge: 2022-08-09 | Disposition: A | Discharge status: Home / Self Care | Payer: Medicare Other | Source: Ambulatory Visit | Attending: Vascular Surgery | Admitting: Vascular Surgery

## 2022-08-09 ENCOUNTER — Other Ambulatory Visit (INDEPENDENT_AMBULATORY_CARE_PROVIDER_SITE_OTHER): Payer: Self-pay | Admitting: Nurse Practitioner

## 2022-08-09 ENCOUNTER — Encounter
Admission: RE | Disposition: A | Discharge status: Home / Self Care | Payer: Self-pay | Source: Ambulatory Visit | Attending: Vascular Surgery

## 2022-08-09 ENCOUNTER — Encounter: Payer: Self-pay | Admitting: Vascular Surgery

## 2022-08-09 DIAGNOSIS — T82838A Hemorrhage of vascular prosthetic devices, implants and grafts, initial encounter: Secondary | ICD-10-CM | POA: Insufficient documentation

## 2022-08-09 DIAGNOSIS — N186 End stage renal disease: Secondary | ICD-10-CM | POA: Insufficient documentation

## 2022-08-09 DIAGNOSIS — T82898A Other specified complication of vascular prosthetic devices, implants and grafts, initial encounter: Secondary | ICD-10-CM | POA: Diagnosis not present

## 2022-08-09 DIAGNOSIS — T82858A Stenosis of vascular prosthetic devices, implants and grafts, initial encounter: Secondary | ICD-10-CM

## 2022-08-09 DIAGNOSIS — Z992 Dependence on renal dialysis: Secondary | ICD-10-CM | POA: Insufficient documentation

## 2022-08-09 DIAGNOSIS — E1122 Type 2 diabetes mellitus with diabetic chronic kidney disease: Secondary | ICD-10-CM | POA: Insufficient documentation

## 2022-08-09 DIAGNOSIS — Y841 Kidney dialysis as the cause of abnormal reaction of the patient, or of later complication, without mention of misadventure at the time of the procedure: Secondary | ICD-10-CM | POA: Insufficient documentation

## 2022-08-09 HISTORY — PX: A/V FISTULAGRAM: CATH118298

## 2022-08-09 LAB — TYPE AND SCREEN
ABO/RH(D): AB POS
Antibody Screen: NEGATIVE
Unit division: 0

## 2022-08-09 LAB — BPAM RBC
Blood Product Expiration Date: 202312212359
ISSUE DATE / TIME: 202312030114
Unit Type and Rh: 6200

## 2022-08-09 LAB — GLUCOSE, CAPILLARY: Glucose-Capillary: 81 mg/dL (ref 70–99)

## 2022-08-09 SURGERY — A/V FISTULAGRAM
Anesthesia: Moderate Sedation | Laterality: Left

## 2022-08-09 MED ORDER — ONDANSETRON HCL 4 MG/2ML IJ SOLN: 4.0000 mg | Freq: Four times a day (QID) | INTRAMUSCULAR | Status: DC | PRN

## 2022-08-09 MED ORDER — CEFAZOLIN SODIUM-DEXTROSE 1-4 GM/50ML-% IV SOLN
INTRAVENOUS | Status: AC
  Filled 2022-08-09: qty 50

## 2022-08-09 MED ORDER — FAMOTIDINE 20 MG PO TABS: 40.0000 mg | ORAL_TABLET | Freq: Once | ORAL | Status: DC | PRN

## 2022-08-09 MED ORDER — ACETAMINOPHEN 500 MG PO TABS: 1000.0000 mg | ORAL_TABLET | Freq: Four times a day (QID) | ORAL | Status: AC | PRN

## 2022-08-09 MED ORDER — HYDROMORPHONE HCL 1 MG/ML IJ SOLN
INTRAMUSCULAR | Status: AC
  Administered 2022-08-09: 0.5 mg via INTRAVENOUS
  Filled 2022-08-09: qty 0.5

## 2022-08-09 MED ORDER — HEPARIN SODIUM (PORCINE) 1000 UNIT/ML IJ SOLN
INTRAMUSCULAR | Status: DC | PRN
  Administered 2022-08-09: 3000 [IU] via INTRAVENOUS

## 2022-08-09 MED ORDER — CEFAZOLIN SODIUM-DEXTROSE 1-4 GM/50ML-% IV SOLN
1.0000 g | INTRAVENOUS | Status: AC
  Administered 2022-08-09: 1 g via INTRAVENOUS

## 2022-08-09 MED ORDER — HYDRALAZINE HCL 20 MG/ML IJ SOLN: 20.0000 mg | Freq: Once | INTRAMUSCULAR | Status: AC

## 2022-08-09 MED ORDER — HEPARIN SODIUM (PORCINE) 1000 UNIT/ML IJ SOLN
INTRAMUSCULAR | Status: AC
  Filled 2022-08-09: qty 10

## 2022-08-09 MED ORDER — LABETALOL HCL 5 MG/ML IV SOLN
INTRAVENOUS | Status: DC | PRN
  Administered 2022-08-09: 20 mg via INTRAVENOUS

## 2022-08-09 MED ORDER — HYDROMORPHONE HCL 1 MG/ML IJ SOLN: 1.0000 mg | Freq: Once | INTRAMUSCULAR | Status: AC | PRN

## 2022-08-09 MED ORDER — ACETAMINOPHEN 500 MG PO TABS: 1000.0000 mg | ORAL_TABLET | Freq: Four times a day (QID) | ORAL | Status: DC | PRN

## 2022-08-09 MED ORDER — IODIXANOL 320 MG/ML IV SOLN
INTRAVENOUS | Status: DC | PRN
  Administered 2022-08-09: 20 mL

## 2022-08-09 MED ORDER — METHYLPREDNISOLONE SODIUM SUCC 125 MG IJ SOLR: 125.0000 mg | Freq: Once | INTRAMUSCULAR | Status: DC | PRN

## 2022-08-09 MED ORDER — MIDAZOLAM HCL 2 MG/ML PO SYRP: 8.0000 mg | ORAL_SOLUTION | Freq: Once | ORAL | Status: DC | PRN

## 2022-08-09 MED ORDER — ACETAMINOPHEN 500 MG PO TABS
ORAL_TABLET | ORAL | Status: AC
  Administered 2022-08-09: 1000 mg via ORAL
  Filled 2022-08-09: qty 2

## 2022-08-09 MED ORDER — SODIUM CHLORIDE 0.9 % IV SOLN: INTRAVENOUS | Status: DC

## 2022-08-09 MED ORDER — LABETALOL HCL 5 MG/ML IV SOLN
INTRAVENOUS | Status: AC
  Filled 2022-08-09: qty 4

## 2022-08-09 MED ORDER — DIPHENHYDRAMINE HCL 50 MG/ML IJ SOLN: 50.0000 mg | Freq: Once | INTRAMUSCULAR | Status: DC | PRN

## 2022-08-09 MED ORDER — HYDRALAZINE HCL 20 MG/ML IJ SOLN
INTRAMUSCULAR | Status: AC
  Administered 2022-08-09: 20 mg via INTRAVENOUS
  Filled 2022-08-09: qty 1

## 2022-08-09 SURGICAL SUPPLY — 18 items
BALLN DORADO 8X40X80 (BALLOONS) ×1
BALLN DORADO7X100X80 (BALLOONS) ×1
BALLN LUTONIX AV 7X60X75 (BALLOONS) ×1
BALLOON DORADO 8X40X80 (BALLOONS) IMPLANT
BALLOON DORADO7X100X80 (BALLOONS) IMPLANT
BALLOON LUTONIX AV 7X60X75 (BALLOONS) IMPLANT
CATH KUMPE SOFT-VU 5FR 65 (CATHETERS) IMPLANT
COVER PROBE ULTRASOUND 5X96 (MISCELLANEOUS) IMPLANT
DRAPE BRACHIAL (DRAPES) IMPLANT
GLIDEWIRE ADV .035X180CM (WIRE) IMPLANT
KIT ENCORE 26 ADVANTAGE (KITS) IMPLANT
KIT MICROPUNCTURE NIT STIFF (SHEATH) IMPLANT
PACK ANGIOGRAPHY (CUSTOM PROCEDURE TRAY) ×1 IMPLANT
SHEATH BRITE TIP 6FRX5.5 (SHEATH) IMPLANT
SHEATH BRITE TIP 7FRX5.5 (SHEATH) IMPLANT
STENT VIABAHN 8X7.5X120 (Permanent Stent) IMPLANT
SUT MNCRL AB 4-0 PS2 18 (SUTURE) IMPLANT
WIRE G V18X300CM (WIRE) IMPLANT

## 2022-08-09 NOTE — Interval H&P Note (Signed)
History and Physical Interval Note:  08/09/2022 12:06 PM  Steve Vance  has presented today for surgery, with the diagnosis of L Arm Fistulagram   End Stage Renal.  The various methods of treatment have been discussed with the patient and family. After consideration of risks, benefits and other options for treatment, the patient has consented to  Procedure(s): A/V Fistulagram (Left) as a surgical intervention.  The patient's history has been reviewed, patient examined, no change in status, stable for surgery.  I have reviewed the patient's chart and labs.  Questions were answered to the patient's satisfaction.     [name]

## 2022-08-09 NOTE — Telephone Encounter (Signed)
Patient was scheduled per the staff message.  Steve Huxley, MD  Kris Hartmann, NP Cc: Devona Konig, CMA      Previous Messages    ----- Message ----- From: Serafina Mitchell, MD Sent: 08/08/2022   5:47 PM EST To: Steve Huxley, MD  Patient needs fistulogram Monday.  He is coming in from home and will arrive at 10 am

## 2022-08-09 NOTE — Progress Notes (Signed)
Pt. Arrived by himself. Sat in waiting area until bed available. Upon arrival to [facility], Pt. States "I drove myself> I have nobody to take me home." States his mother passed away recently and "she was the only one who helped me.j" MD made aware: pt. To receive NO sedation. Also asked MD if ok to use K+ 4.2 from yesterday> MD stated "it's ok."

## 2022-08-09 NOTE — Op Note (Signed)
[facility]    OPERATIVE NOTE   PROCEDURE: 1.   Left brachiocephalic arteriovenous fistula cannulation under ultrasound guidance 2.   Left arm fistulagram including central venogram 3.   Percutaneous transluminal angioplasty of the left cephalic vein subclavian vein confluence with 7 mm diameter Lutonix drug-coated angioplasty balloon 4.   Stent placement to the left cephalic vein subclavian vein confluence with 8 mm diameter by 7.5 cm length Viabahn stent   PRE-OPERATIVE DIAGNOSIS: 1. ESRD 2. 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  There were no focal stenosis until the cephalic vein subclavian vein confluence where there was a high-grade greater than 90% stenosis.  The subclavian vein and the remainder of the central venous circulation was fairly normal.  SPECIMEN(S):  None  CONTRAST: 20 cc  FLUORO TIME: 7.3 minutes   INDICATIONS: Steve Vance is a 61 y.o. male who presents with malfunctioning, aneurysmal, left brachiocephalic arteriovenous fistula.  The patient is scheduled for left arm fistulagram.  The patient is aware the risks include but are not limited to: bleeding, infection, thrombosis of the cannulated access, and possible anaphylactic reaction to the contrast.  The patient is aware of the risks of the procedure and elects to proceed forward.  DESCRIPTION: After full informed written consent was obtained, the patient was brought back to the angiography suite and placed supine upon the angiography table.  The patient was connected to monitoring equipment. The left arm was prepped and draped in the standard fashion for a percutaneous access intervention.  Under  ultrasound guidance, the left brachiocephalic arteriovenous fistula was cannulated with a micropuncture needle under direct ultrasound guidance where it was patent and a permanent image was performed.  The microwire was advanced into the fistula and the needle was exchanged for the a microsheath.  I then upsized to a 6 Fr Sheath and imaging was performed.  Hand injections were completed to image the access including the central venous system. This demonstrated that the left brachiocephalic AV fistula was markedly tortuous and aneurysmal and the access sites in the upper arm.  There were no focal stenosis until the cephalic vein subclavian vein confluence where there was a high-grade greater than 90% stenosis.  The subclavian vein and the remainder of the central venous circulation was fairly normal..  Based on the images, this patient will need intervention to the cephalic vein subclavian vein confluence stenosis. I then gave the patient 3000 units of intravenous heparin.  I then crossed the stenosis with an advantage wire.  Based on the imaging, a 7 mm x 10 cm Lutonix drug-coated angioplasty balloon was selected.  The balloon was centered around the cephalic vein subclavian vein confluence stenosis and inflated to 14 ATM for 1 minute(s).  On completion imaging, a greater than 50% residual stenosis was present.  I then upsized to a 7 [place] sheath and exchanged for a 0.018 wire.  I then placed an 8 mm diameter by 7.5 cm length Viabahn stent in the cephalic vein subclavian vein confluence just to the subclavian vein and then postdilated this with a 7 and 8 mm diameter angioplasty balloon.  Completion imaging showed marked improvement although there did remain about a 30 to 40% residual stenosis even after  an 8 mm diameter high-pressure angioplasty balloon   Based on the completion imaging, no further intervention is necessary.  The wire and balloon were removed from the sheath.  A 4-0 Monocryl purse-string  suture was sewn around the sheath.  The sheath was removed while tying down the suture.  A sterile bandage was applied to the puncture site.  COMPLICATIONS: None  CONDITION: Stable   [name]  08/09/2022 2:38 PM   This note was created with Dragon Medical transcription system. Any errors in dictation are purely unintentional.

## 2022-08-09 NOTE — Progress Notes (Signed)
Called pt. Sister Darlene: requesting sister to give pt. A ride home secondary to pt. Requesting IV pain meds post procedure. Darlene says she can pick him up after 5 pm. Sister given phone #here.

## 2022-08-10 ENCOUNTER — Encounter: Payer: Self-pay | Admitting: Vascular Surgery

## 2022-09-03 ENCOUNTER — Other Ambulatory Visit (INDEPENDENT_AMBULATORY_CARE_PROVIDER_SITE_OTHER): Payer: Self-pay | Admitting: Vascular Surgery

## 2022-09-03 DIAGNOSIS — N186 End stage renal disease: Secondary | ICD-10-CM

## 2022-09-03 DIAGNOSIS — Z95820 Peripheral vascular angioplasty status with implants and grafts: Secondary | ICD-10-CM

## 2022-09-08 ENCOUNTER — Encounter (INDEPENDENT_AMBULATORY_CARE_PROVIDER_SITE_OTHER): Payer: Medicare Other

## 2022-09-08 ENCOUNTER — Ambulatory Visit (INDEPENDENT_AMBULATORY_CARE_PROVIDER_SITE_OTHER): Payer: Medicare Other | Admitting: Nurse Practitioner

## 2022-09-26 ENCOUNTER — Ambulatory Visit
Admission: EM | Admit: 2022-09-26 | Discharge: 2022-09-26 | Disposition: A | Discharge status: Home / Self Care | Payer: 59 | Attending: Family Medicine | Admitting: Family Medicine

## 2022-09-26 ENCOUNTER — Ambulatory Visit (INDEPENDENT_AMBULATORY_CARE_PROVIDER_SITE_OTHER): Payer: 59

## 2022-09-26 DIAGNOSIS — J40 Bronchitis, not specified as acute or chronic: Secondary | ICD-10-CM

## 2022-09-26 MED ORDER — PROMETHAZINE-DM 6.25-15 MG/5ML PO SYRP: 5.0000 mL | ORAL_SOLUTION | Freq: Four times a day (QID) | ORAL | 0 refills | Status: DC | PRN

## 2022-09-26 MED ORDER — DOXYCYCLINE HYCLATE 100 MG PO TABS: 100.0000 mg | ORAL_TABLET | Freq: Two times a day (BID) | ORAL | 0 refills | Status: DC

## 2022-09-26 NOTE — Discharge Instructions (Addendum)
If you worsen, please go to the hospital.

## 2022-09-26 NOTE — ED Triage Notes (Signed)
Pt c/o cough x2weeks  Pt states that he does not know his medications.
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
## 2022-09-27 ENCOUNTER — Encounter: Payer: Self-pay | Admitting: Emergency Medicine

## 2022-09-27 ENCOUNTER — Emergency Department: Payer: 59

## 2022-09-27 DIAGNOSIS — Z8249 Family history of ischemic heart disease and other diseases of the circulatory system: Secondary | ICD-10-CM

## 2022-09-27 DIAGNOSIS — R197 Diarrhea, unspecified: Secondary | ICD-10-CM | POA: Diagnosis present

## 2022-09-27 DIAGNOSIS — Z1152 Encounter for screening for COVID-19: Secondary | ICD-10-CM

## 2022-09-27 DIAGNOSIS — E785 Hyperlipidemia, unspecified: Secondary | ICD-10-CM | POA: Diagnosis present

## 2022-09-27 DIAGNOSIS — G8929 Other chronic pain: Secondary | ICD-10-CM | POA: Diagnosis present

## 2022-09-27 DIAGNOSIS — E1122 Type 2 diabetes mellitus with diabetic chronic kidney disease: Secondary | ICD-10-CM | POA: Diagnosis present

## 2022-09-27 DIAGNOSIS — Z888 Allergy status to other drugs, medicaments and biological substances status: Secondary | ICD-10-CM

## 2022-09-27 DIAGNOSIS — I132 Hypertensive heart and chronic kidney disease with heart failure and with stage 5 chronic kidney disease, or end stage renal disease: Principal | ICD-10-CM | POA: Diagnosis present

## 2022-09-27 DIAGNOSIS — M549 Dorsalgia, unspecified: Secondary | ICD-10-CM | POA: Diagnosis present

## 2022-09-27 DIAGNOSIS — Z992 Dependence on renal dialysis: Secondary | ICD-10-CM

## 2022-09-27 DIAGNOSIS — M170 Bilateral primary osteoarthritis of knee: Secondary | ICD-10-CM | POA: Diagnosis present

## 2022-09-27 DIAGNOSIS — I160 Hypertensive urgency: Secondary | ICD-10-CM | POA: Diagnosis present

## 2022-09-27 DIAGNOSIS — N186 End stage renal disease: Secondary | ICD-10-CM | POA: Diagnosis present

## 2022-09-27 DIAGNOSIS — Z5321 Procedure and treatment not carried out due to patient leaving prior to being seen by health care provider: Secondary | ICD-10-CM | POA: Diagnosis present

## 2022-09-27 DIAGNOSIS — E669 Obesity, unspecified: Secondary | ICD-10-CM | POA: Diagnosis present

## 2022-09-27 DIAGNOSIS — I440 Atrioventricular block, first degree: Secondary | ICD-10-CM | POA: Diagnosis present

## 2022-09-27 DIAGNOSIS — I509 Heart failure, unspecified: Secondary | ICD-10-CM | POA: Diagnosis not present

## 2022-09-27 DIAGNOSIS — I5043 Acute on chronic combined systolic (congestive) and diastolic (congestive) heart failure: Secondary | ICD-10-CM | POA: Diagnosis present

## 2022-09-27 DIAGNOSIS — D631 Anemia in chronic kidney disease: Secondary | ICD-10-CM | POA: Diagnosis present

## 2022-09-27 DIAGNOSIS — Z885 Allergy status to narcotic agent status: Secondary | ICD-10-CM

## 2022-09-27 DIAGNOSIS — Z79899 Other long term (current) drug therapy: Secondary | ICD-10-CM

## 2022-09-27 DIAGNOSIS — Z6833 Body mass index (BMI) 33.0-33.9, adult: Secondary | ICD-10-CM

## 2022-09-27 LAB — CBC WITH DIFFERENTIAL/PLATELET
Abs Immature Granulocytes: 0.02 10*3/uL (ref 0.00–0.07)
Basophils Absolute: 0 10*3/uL (ref 0.0–0.1)
Basophils Relative: 0 %
Eosinophils Absolute: 0.1 10*3/uL (ref 0.0–0.5)
Eosinophils Relative: 3 %
HCT: 34.4 % — ABNORMAL LOW (ref 39.0–52.0)
Hemoglobin: 10.6 g/dL — ABNORMAL LOW (ref 13.0–17.0)
Immature Granulocytes: 1 %
Lymphocytes Relative: 17 %
Lymphs Abs: 0.7 10*3/uL (ref 0.7–4.0)
MCH: 27.8 pg (ref 26.0–34.0)
MCHC: 30.8 g/dL (ref 30.0–36.0)
MCV: 90.3 fL (ref 80.0–100.0)
Monocytes Absolute: 0.4 10*3/uL (ref 0.1–1.0)
Monocytes Relative: 10 %
Neutro Abs: 2.9 10*3/uL (ref 1.7–7.7)
Neutrophils Relative %: 69 %
Platelets: 163 10*3/uL (ref 150–400)
RBC: 3.81 MIL/uL — ABNORMAL LOW (ref 4.22–5.81)
RDW: 16.8 % — ABNORMAL HIGH (ref 11.5–15.5)
WBC: 4.1 10*3/uL (ref 4.0–10.5)
nRBC: 0 % (ref 0.0–0.2)

## 2022-09-27 NOTE — ED Triage Notes (Signed)
Pt presents via POV with complaints of SOB that started about and an hour ago. Hx of CHF & Dialysis (T/TR/Sat). Pt with a productive cough - O2 sats 97% on RA. No increase WOB. Of note, pt had negative COVID & Flu test yesterday per family. Denies CP, fevers, chills, N/V/D.

## 2022-09-28 ENCOUNTER — Emergency Department: Payer: 59

## 2022-09-28 ENCOUNTER — Inpatient Hospital Stay
Admission: EM | Admit: 2022-09-28 | Discharge: 2022-09-28 | DRG: 291 | Discharge status: Against Medical Advice | Payer: 59 | Attending: Internal Medicine | Admitting: Internal Medicine

## 2022-09-28 DIAGNOSIS — D631 Anemia in chronic kidney disease: Secondary | ICD-10-CM | POA: Diagnosis present

## 2022-09-28 DIAGNOSIS — Z79899 Other long term (current) drug therapy: Secondary | ICD-10-CM | POA: Diagnosis not present

## 2022-09-28 DIAGNOSIS — Z888 Allergy status to other drugs, medicaments and biological substances status: Secondary | ICD-10-CM | POA: Diagnosis not present

## 2022-09-28 DIAGNOSIS — Z1152 Encounter for screening for COVID-19: Secondary | ICD-10-CM | POA: Diagnosis not present

## 2022-09-28 DIAGNOSIS — M549 Dorsalgia, unspecified: Secondary | ICD-10-CM | POA: Diagnosis present

## 2022-09-28 DIAGNOSIS — I5043 Acute on chronic combined systolic (congestive) and diastolic (congestive) heart failure: Secondary | ICD-10-CM

## 2022-09-28 DIAGNOSIS — M170 Bilateral primary osteoarthritis of knee: Secondary | ICD-10-CM | POA: Diagnosis present

## 2022-09-28 DIAGNOSIS — E669 Obesity, unspecified: Secondary | ICD-10-CM | POA: Diagnosis present

## 2022-09-28 DIAGNOSIS — G8929 Other chronic pain: Secondary | ICD-10-CM | POA: Diagnosis present

## 2022-09-28 DIAGNOSIS — E785 Hyperlipidemia, unspecified: Secondary | ICD-10-CM | POA: Diagnosis present

## 2022-09-28 DIAGNOSIS — R197 Diarrhea, unspecified: Secondary | ICD-10-CM | POA: Diagnosis present

## 2022-09-28 DIAGNOSIS — Z5321 Procedure and treatment not carried out due to patient leaving prior to being seen by health care provider: Secondary | ICD-10-CM | POA: Diagnosis present

## 2022-09-28 DIAGNOSIS — Z885 Allergy status to narcotic agent status: Secondary | ICD-10-CM | POA: Diagnosis not present

## 2022-09-28 DIAGNOSIS — E1122 Type 2 diabetes mellitus with diabetic chronic kidney disease: Secondary | ICD-10-CM | POA: Diagnosis present

## 2022-09-28 DIAGNOSIS — N186 End stage renal disease: Secondary | ICD-10-CM | POA: Diagnosis present

## 2022-09-28 DIAGNOSIS — I160 Hypertensive urgency: Secondary | ICD-10-CM | POA: Diagnosis present

## 2022-09-28 DIAGNOSIS — I132 Hypertensive heart and chronic kidney disease with heart failure and with stage 5 chronic kidney disease, or end stage renal disease: Secondary | ICD-10-CM | POA: Diagnosis present

## 2022-09-28 DIAGNOSIS — Z992 Dependence on renal dialysis: Secondary | ICD-10-CM | POA: Diagnosis not present

## 2022-09-28 DIAGNOSIS — I509 Heart failure, unspecified: Principal | ICD-10-CM | POA: Insufficient documentation

## 2022-09-28 DIAGNOSIS — Z8249 Family history of ischemic heart disease and other diseases of the circulatory system: Secondary | ICD-10-CM | POA: Diagnosis not present

## 2022-09-28 DIAGNOSIS — Z6833 Body mass index (BMI) 33.0-33.9, adult: Secondary | ICD-10-CM | POA: Diagnosis not present

## 2022-09-28 DIAGNOSIS — I440 Atrioventricular block, first degree: Secondary | ICD-10-CM | POA: Diagnosis present

## 2022-09-28 LAB — COMPREHENSIVE METABOLIC PANEL
ALT: 17 U/L (ref 0–44)
AST: 28 U/L (ref 15–41)
Albumin: 3.6 g/dL (ref 3.5–5.0)
Alkaline Phosphatase: 117 U/L (ref 38–126)
Anion gap: 17 — ABNORMAL HIGH (ref 5–15)
BUN: 55 mg/dL — ABNORMAL HIGH (ref 8–23)
CO2: 22 mmol/L (ref 22–32)
Calcium: 8.1 mg/dL — ABNORMAL LOW (ref 8.9–10.3)
Chloride: 95 mmol/L — ABNORMAL LOW (ref 98–111)
Creatinine, Ser: 8.23 mg/dL — ABNORMAL HIGH (ref 0.61–1.24)
GFR, Estimated: 7 mL/min — ABNORMAL LOW (ref >60)
Glucose, Bld: 98 mg/dL (ref 70–99)
Potassium: 5.2 mmol/L — ABNORMAL HIGH (ref 3.5–5.1)
Sodium: 134 mmol/L — ABNORMAL LOW (ref 135–145)
Total Bilirubin: 1.5 mg/dL — ABNORMAL HIGH (ref 0.3–1.2)
Total Protein: 9.1 g/dL — ABNORMAL HIGH (ref 6.5–8.1)

## 2022-09-28 LAB — BLOOD GAS, VENOUS
Acid-base deficit: 3.3 mmol/L — ABNORMAL HIGH (ref 0.0–2.0)
Bicarbonate: 24 mmol/L (ref 20.0–28.0)
O2 Saturation: 14.4 %
Patient temperature: 37
pCO2, Ven: 51 mmHg (ref 44–60)
pH, Ven: 7.28 (ref 7.25–7.43)

## 2022-09-28 LAB — HEPATITIS B SURFACE ANTIGEN: Hepatitis B Surface Ag: NONREACTIVE

## 2022-09-28 LAB — RESP PANEL BY RT-PCR (RSV, FLU A&B, COVID)  RVPGX2
Influenza A by PCR: NEGATIVE
Influenza B by PCR: NEGATIVE
Resp Syncytial Virus by PCR: NEGATIVE
SARS Coronavirus 2 by RT PCR: NEGATIVE

## 2022-09-28 LAB — RENAL FUNCTION PANEL
Albumin: 3.5 g/dL (ref 3.5–5.0)
Anion gap: 17 — ABNORMAL HIGH (ref 5–15)
BUN: 60 mg/dL — ABNORMAL HIGH (ref 8–23)
CO2: 19 mmol/L — ABNORMAL LOW (ref 22–32)
Calcium: 8 mg/dL — ABNORMAL LOW (ref 8.9–10.3)
Chloride: 97 mmol/L — ABNORMAL LOW (ref 98–111)
Creatinine, Ser: 8.68 mg/dL — ABNORMAL HIGH (ref 0.61–1.24)
GFR, Estimated: 6 mL/min — ABNORMAL LOW (ref >60)
Glucose, Bld: 83 mg/dL (ref 70–99)
Phosphorus: 8.3 mg/dL — ABNORMAL HIGH (ref 2.5–4.6)
Potassium: 5.1 mmol/L (ref 3.5–5.1)
Sodium: 133 mmol/L — ABNORMAL LOW (ref 135–145)

## 2022-09-28 LAB — TROPONIN I (HIGH SENSITIVITY)
Troponin I (High Sensitivity): 79 ng/L — ABNORMAL HIGH (ref <18)
Troponin I (High Sensitivity): 78 ng/L — ABNORMAL HIGH (ref <18)

## 2022-09-28 LAB — BRAIN NATRIURETIC PEPTIDE: B Natriuretic Peptide: 1092.7 pg/mL — ABNORMAL HIGH (ref 0.0–100.0)

## 2022-09-28 MED ORDER — LABETALOL HCL 5 MG/ML IV SOLN
10.0000 mg | Freq: Once | INTRAVENOUS | Status: AC
  Administered 2022-09-28: 10 mg via INTRAVENOUS
  Filled 2022-09-28: qty 4

## 2022-09-28 MED ORDER — PENTAFLUOROPROP-TETRAFLUOROETH EX AERO: 1.0000 | INHALATION_SPRAY | CUTANEOUS | Status: DC | PRN

## 2022-09-28 MED ORDER — ALBUTEROL SULFATE (2.5 MG/3ML) 0.083% IN NEBU: 2.5000 mg | INHALATION_SOLUTION | RESPIRATORY_TRACT | Status: DC | PRN

## 2022-09-28 MED ORDER — RENA-VITE PO TABS: 1.0000 | ORAL_TABLET | Freq: Every day | ORAL | Status: DC

## 2022-09-28 MED ORDER — ONDANSETRON HCL 4 MG/2ML IJ SOLN: 4.0000 mg | Freq: Four times a day (QID) | INTRAMUSCULAR | Status: DC | PRN

## 2022-09-28 MED ORDER — LIDOCAINE HCL (PF) 1 % IJ SOLN: 5.0000 mL | INTRAMUSCULAR | Status: DC | PRN

## 2022-09-28 MED ORDER — CHLORHEXIDINE GLUCONATE CLOTH 2 % EX PADS
6.0000 | MEDICATED_PAD | Freq: Every day | CUTANEOUS | Status: DC
  Filled 2022-09-28: qty 6

## 2022-09-28 MED ORDER — AMLODIPINE BESYLATE 10 MG PO TABS: 10.0000 mg | ORAL_TABLET | Freq: Every day | ORAL | Status: DC

## 2022-09-28 MED ORDER — CARVEDILOL 6.25 MG PO TABS: 6.2500 mg | ORAL_TABLET | Freq: Two times a day (BID) | ORAL | Status: DC

## 2022-09-28 MED ORDER — HYDROCODONE-ACETAMINOPHEN 5-325 MG PO TABS: 1.0000 | ORAL_TABLET | ORAL | Status: DC | PRN

## 2022-09-28 MED ORDER — HEPARIN SODIUM (PORCINE) 1000 UNIT/ML DIALYSIS: 1000.0000 [IU] | INTRAMUSCULAR | Status: DC | PRN

## 2022-09-28 MED ORDER — NEPRO/CARBSTEADY PO LIQD: 237.0000 mL | Freq: Two times a day (BID) | ORAL | Status: DC

## 2022-09-28 MED ORDER — LABETALOL HCL 5 MG/ML IV SOLN
5.0000 mg | Freq: Once | INTRAVENOUS | Status: AC
  Administered 2022-09-28: 5 mg via INTRAVENOUS
  Filled 2022-09-28: qty 4

## 2022-09-28 MED ORDER — FAMOTIDINE 20 MG PO TABS: 20.0000 mg | ORAL_TABLET | Freq: Every day | ORAL | Status: DC

## 2022-09-28 MED ORDER — DIPHENHYDRAMINE-ZINC ACETATE 2-0.1 % EX CREA: 1.0000 | TOPICAL_CREAM | Freq: Three times a day (TID) | CUTANEOUS | Status: DC | PRN

## 2022-09-28 MED ORDER — ISOSORBIDE MONONITRATE ER 30 MG PO TB24: 30.0000 mg | ORAL_TABLET | Freq: Every day | ORAL | Status: DC

## 2022-09-28 MED ORDER — CALCIUM ACETATE (PHOS BINDER) 667 MG PO CAPS
667.0000 mg | ORAL_CAPSULE | Freq: Three times a day (TID) | ORAL | Status: DC
  Filled 2022-09-28 (×2): qty 1

## 2022-09-28 MED ORDER — LISINOPRIL 20 MG PO TABS: 40.0000 mg | ORAL_TABLET | Freq: Every day | ORAL | Status: DC

## 2022-09-28 MED ORDER — ANTICOAGULANT SODIUM CITRATE 4% (200MG/5ML) IV SOLN: 5.0000 mL | Status: DC | PRN

## 2022-09-28 MED ORDER — SODIUM CHLORIDE 0.9% FLUSH: 3.0000 mL | Freq: Two times a day (BID) | INTRAVENOUS | Status: DC

## 2022-09-28 MED ORDER — ONDANSETRON HCL 4 MG PO TABS: 4.0000 mg | ORAL_TABLET | Freq: Four times a day (QID) | ORAL | Status: DC | PRN

## 2022-09-28 MED ORDER — HEPARIN SODIUM (PORCINE) 5000 UNIT/ML IJ SOLN: 5000.0000 [IU] | Freq: Three times a day (TID) | INTRAMUSCULAR | Status: DC

## 2022-09-28 MED ORDER — FUROSEMIDE 10 MG/ML IJ SOLN
80.0000 mg | Freq: Once | INTRAMUSCULAR | Status: AC
  Administered 2022-09-28: 80 mg via INTRAVENOUS
  Filled 2022-09-28: qty 8

## 2022-09-28 MED ORDER — SODIUM CHLORIDE 0.9% FLUSH: 3.0000 mL | INTRAVENOUS | Status: DC | PRN

## 2022-09-28 MED ORDER — SODIUM CHLORIDE 0.9 % IV SOLN: 250.0000 mL | INTRAVENOUS | Status: DC | PRN

## 2022-09-28 MED ORDER — ATORVASTATIN CALCIUM 10 MG PO TABS: 10.0000 mg | ORAL_TABLET | Freq: Every day | ORAL | Status: DC

## 2022-09-28 MED ORDER — ALTEPLASE 2 MG IJ SOLR: 2.0000 mg | Freq: Once | INTRAMUSCULAR | Status: DC | PRN

## 2022-09-28 MED ORDER — LIDOCAINE-PRILOCAINE 2.5-2.5 % EX CREA: 1.0000 | TOPICAL_CREAM | CUTANEOUS | Status: DC | PRN

## 2022-09-28 NOTE — Discharge Summary (Signed)
Physician Discharge Summary   Patient: Steve Vance MRN: [id number] DOB: 07-05-1961  Admit date:     09/28/2022  Discharge date: 09/28/22  Discharge Physician: Kenzie Flakes   PCP: Theotis Burrow, MD   Recommendations at discharge:   Patient signed out [place]  Discharge Diagnoses: Principal Problem:   Acute on chronic combined systolic and diastolic CHF (congestive heart failure) ([place]) Active Problems:   Obesity (BMI 30.0-34.9)   Diabetes mellitus with ESRD (end-stage renal disease) ([place])   Hypertensive urgency   Diarrhea   Anemia due to chronic kidney disease  Resolved Problems:   * No resolved hospital problems. *  Hospital Course: Steve Vance is a 62 y.o. male with medical history significant for diabetes mellitus with complications of end-stage renal disease on hemodialysis (dialysis days are T/TH/S), history of hypertension, chronic combined systolic and diastolic dysfunction CHF with last known LVEF of 45 - 50%, obesity, hypertension, anxiety disorder who presented to the ER for evaluation of sudden onset shortness of breath.   Patient stated that he was in his usual state of health until last night when he developed shortness of breath that had progressively worsened prompting his visit to the ER.  He had leg swelling and also had a cough which has been going on for several days and for which he was placed on doxycycline for presumed pneumonia at the urgent care center 2 days prior to this admission.  He complained of diarrhea which he has had for several days.  Denies having any blood in his stools. He felt comfortable sitting up and is unable to lay flat.  He denied  having any chest pain, no fever, no chills, no abdominal pain, no dizziness, no lightheadedness, no palpitations, no diaphoresis, blurred vision or focal deficit. Upon arrival to the ER blood pressure was significantly elevated with a systolic blood pressure of 209 Abnormal labs  include potassium 5.2, BUN 55, creatinine 8.23, BNP 1092 Chest x-ray reviewed by me shows stable cardiomegaly Twelve-lead EKG reviewed by me shows sinus rhythm with first-degree AV block Patient still makes urine and received a dose of Lasix 80 mg IV as well as labetalol 5 mg IV He will be admitted to the hospital for further evaluation Assessment and Plan: * Acute on chronic combined systolic and diastolic CHF (congestive heart failure) ([place]) Patient presented for evaluation of sudden onset shortness of breath, markedly elevated blood pressure concerning for flash pulmonary edema Last known LVEF of 45 to 50% from a 2D echocardiogram which was done 07/23 Optimize blood pressure control Continue carvedilol, lisinopril Nephrology consulted for renal replacement therapy  Obesity (BMI 30.0-34.9) BMI 33 Complicates overall and care Lifestyle modification and exercise has been discussed in detail  Diabetes mellitus with ESRD (end-stage renal disease) ([place]) Patient has a history of diabetes mellitus with complications of end-stage renal disease on hemodialysis Dialysis days are Tuesday/Thursday/Saturday Nephrology consult for renal replacement therapy  Check blood sugars with meals  Hypertensive urgency Optimize blood pressure control Continue amlodipine, carvedilol, nitrates and lisinopril  Anemia due to chronic kidney disease H&H is stable Monitor closely during this admission  Diarrhea Probably antibiotic induced Send stool for C. difficile toxin         Consultants: Nephrology Procedures performed: Hemodialysis Disposition:  Patient signed out against medical Diet recommendation:  Renal diet DISCHARGE MEDICATION:   Discharge Exam: Filed Weights   09/27/22 2328 09/28/22 1004  Weight: 108.6 kg 120.1 kg   Vitals and nursing note reviewed.  Constitutional:  Appearance: He is obese.     Comments: Chronically ill-appearing.  Looks older than stated age.  Sitting  up in bed and has conversational dyspnea  HENT:     Head: Normocephalic and atraumatic.     Mouth/Throat:     Mouth: Mucous membranes are moist.  Eyes:     Comments: Pale conjunctiva  Cardiovascular:     Rate and Rhythm: Normal rate and regular rhythm.  Pulmonary:     Effort: Tachypnea present.     Breath sounds: Examination of the right-lower field reveals rales. Examination of the left-lower field reveals rales. Rales present.  Abdominal:     General: Bowel sounds are normal.     Palpations: Abdomen is soft.  Musculoskeletal:     Cervical back: Normal range of motion and neck supple.     Right lower leg: Edema present.     Left lower leg: Edema present.  Skin:    General: Skin is warm and dry.  Neurological:     General: No focal deficit present.     Mental Status: He is alert.  Psychiatric:        Mood and Affect: Mood normal.        Behavior: Behavior normal.   Condition at discharge:  Patient signed out [place]  The results of significant diagnostics from this hospitalization (including imaging, microbiology, ancillary and laboratory) are listed below for reference.   Imaging Studies: DG Chest 1 View  Result Date: 09/28/2022 CLINICAL DATA:  Dyspnea EXAM: CHEST  1 VIEW COMPARISON:  09/26/2022 FINDINGS: Linear scarring within the right lung base. Lungs are otherwise clear. No pneumothorax or pleural effusion. Stable cardiomegaly. Pulmonary vascularity is normal. Vascular stent again noted within the expected left cephalic arch. Left glenoid ORIF has been performed. Degenerative changes are noted within the shoulders. No acute bone abnormality. IMPRESSION: 1. No active disease. 2. Stable cardiomegaly. Electronically Signed   By: Fidela Salisbury M.D.   On: 09/28/2022 01:28   DG Chest 2 View  Result Date: 09/26/2022 CLINICAL DATA:  Cough for 2 weeks. EXAM: CHEST - 2 VIEW COMPARISON:  04/29/2022 FINDINGS: Cardiac enlargement, stable. No pleural effusion or edema.  Platelike atelectasis identified within the right lower lung. No airspace disease. Postsurgical changes noted within the left shoulder. IMPRESSION: Right lower lung platelike atelectasis. Electronically Signed   By: Kerby Moors M.D.   On: 09/26/2022 13:08    Microbiology: Results for orders placed or performed during the hospital encounter of 09/28/22  Resp panel by RT-PCR (RSV, Flu A&B, Covid) Anterior Nasal Swab     Status: None   Collection Time: 09/28/22  4:16 AM   Specimen: Anterior Nasal Swab  Result Value Ref Range Status   SARS Coronavirus 2 by RT PCR NEGATIVE NEGATIVE Final    Comment: (NOTE) SARS-CoV-2 target nucleic acids are NOT DETECTED.  The SARS-CoV-2 RNA is generally detectable in upper respiratory specimens during the acute phase of infection. The lowest concentration of SARS-CoV-2 viral copies this assay can detect is 138 copies/mL. A negative result does not preclude SARS-Cov-2 infection and should not be used as the sole basis for treatment or other patient management decisions. A negative result may occur with  improper specimen collection/handling, submission of specimen other than nasopharyngeal swab, presence of viral mutation(s) within the areas targeted by this assay, and inadequate number of viral copies(<138 copies/mL). A negative result must be combined with clinical observations, patient history, and epidemiological information. The expected result is Negative.  Fact Sheet for Patients:  EntrepreneurPulse.com.au  Fact Sheet for Healthcare Providers:  IncredibleEmployment.be  This test is no t yet approved or cleared by the [place] FDA and  has been authorized for detection and/or diagnosis of SARS-CoV-2 by FDA under an Emergency Use Authorization (EUA). This EUA will remain  in effect (meaning this test can be used) for the duration of the COVID-19 declaration under Section 564(b)(1) of the Act,  21 U.S.C.section 360bbb-3(b)(1), unless the authorization is terminated  or revoked sooner.       Influenza A by PCR NEGATIVE NEGATIVE Final   Influenza B by PCR NEGATIVE NEGATIVE Final    Comment: (NOTE) The Xpert Xpress SARS-CoV-2/FLU/RSV plus assay is intended as an aid in the diagnosis of influenza from Nasopharyngeal swab specimens and should not be used as a sole basis for treatment. Nasal washings and aspirates are unacceptable for Xpert Xpress SARS-CoV-2/FLU/RSV testing.  Fact Sheet for Patients: EntrepreneurPulse.com.au  Fact Sheet for Healthcare Providers: IncredibleEmployment.be  This test is not yet approved or cleared by the [place] FDA and has been authorized for detection and/or diagnosis of SARS-CoV-2 by FDA under an Emergency Use Authorization (EUA). This EUA will remain in effect (meaning this test can be used) for the duration of the COVID-19 declaration under Section 564(b)(1) of the Act, 21 U.S.C. section 360bbb-3(b)(1), unless the authorization is terminated or revoked.     Resp Syncytial Virus by PCR NEGATIVE NEGATIVE Final    Comment: (NOTE) Fact Sheet for Patients: EntrepreneurPulse.com.au  Fact Sheet for Healthcare Providers: IncredibleEmployment.be  This test is not yet approved or cleared by the [place] FDA and has been authorized for detection and/or diagnosis of SARS-CoV-2 by FDA under an Emergency Use Authorization (EUA). This EUA will remain in effect (meaning this test can be used) for the duration of the COVID-19 declaration under Section 564(b)(1) of the Act, 21 U.S.C. section 360bbb-3(b)(1), unless the authorization is terminated or revoked.  Performed at [facility], [address] [place], [place] [address]     Labs: CBC: Recent Labs  Lab 09/27/22 2322  WBC 4.1  NEUTROABS 2.9  HGB 10.6*  HCT 34.4*  MCV 90.3  PLT 846   Basic  Metabolic Panel: Recent Labs  Lab 09/27/22 2322 09/28/22 1016  NA 134* 133*  K 5.2* 5.1  CL 95* 97*  CO2 22 19*  GLUCOSE 98 83  BUN 55* 60*  CREATININE 8.23* 8.68*  CALCIUM 8.1* 8.0*  PHOS  --  8.3*   Liver Function Tests: Recent Labs  Lab 09/27/22 2322 09/28/22 1016  AST 28  --   ALT 17  --   ALKPHOS 117  --   BILITOT 1.5*  --   PROT 9.1*  --   ALBUMIN 3.6 3.5   CBG: No results for input(s): "GLUCAP" in the last 168 hours.  Discharge time spent: less than 30 minutes.  Signed: Collier Bullock, MD [facility] 09/28/2022

## 2022-09-28 NOTE — ED Provider Notes (Signed)
[facility] Provider Note    Event Date/Time   First MD Initiated Contact with Patient 09/28/22 [phone]     (approximate)   History   Shortness of Breath   HPI  Steve Vance is a 62 y.o. male with a history of type 2 diabetes, ESRD on dialysis (T/T/S), CHF, anemia, and anxiety who presents with shortness of breath, acute onset over the last few hours, persistent course, associated with cough which has been present for several days.  He also reports increased leg swelling.  He denies chest pain.  He has no fever or chills.  He has no vomiting but does endorse some diarrhea.  He states that he still does make some urine.  I reviewed the past medical records.  The patient was admitted last month.  Per the hospitalist discharge summary from 08/08/2022 he presented with acute onset of bleeding from the AV fistula in the left upper extremity along with hypertension and anemia.  He was also seen at urgent care on 1/21 for cough although did not report significant shortness of breath at that time.  He was started on doxycycline for presumed pneumonia.   Physical Exam   Triage Vital Signs: ED Triage Vitals  Enc Vitals Group     BP 09/27/22 2311 (!) 209/118     Pulse Rate 09/27/22 2311 89     Resp 09/27/22 2311 20     Temp 09/27/22 2311 (!) 97.5 F (36.4 C)     Temp Source 09/27/22 2311 Oral     SpO2 09/27/22 2311 97 %     Weight 09/27/22 2328 239 lb 7.8 oz (108.6 kg)     Height 09/27/22 2328 $RemoveBefor'5\' 11"'[id number]$  (1.803 m)     Head Circumference --      Peak Flow --      Pain Score 09/27/22 2317 0     Pain Loc --      Pain Edu? --      Excl. in [place]? --     Most recent vital signs: Vitals:   09/28/22 0425 09/28/22 0600  BP:  (!) 192/96  Pulse:  81  Resp:  16  Temp:    SpO2: 98% 96%     General: Awake, no distress.  CV:  Good peripheral perfusion.  Resp:  Slightly increased respiratory effort.  Bilateral rales. Abd:  No distention.  Other:  2+ bilateral lower  extremity edema.   ED Results / Procedures / Treatments   Labs (all labs ordered are listed, but only abnormal results are displayed) Labs Reviewed  CBC WITH DIFFERENTIAL/PLATELET - Abnormal; Notable for the following components:      Result Value   RBC 3.81 (*)    Hemoglobin 10.6 (*)    HCT 34.4 (*)    RDW 16.8 (*)    All other components within normal limits  COMPREHENSIVE METABOLIC PANEL - Abnormal; Notable for the following components:   Sodium 134 (*)    Potassium 5.2 (*)    Chloride 95 (*)    BUN 55 (*)    Creatinine, Ser 8.23 (*)    Calcium 8.1 (*)    Total Protein 9.1 (*)    Total Bilirubin 1.5 (*)    GFR, Estimated 7 (*)    Anion gap 17 (*)    All other components within normal limits  BRAIN NATRIURETIC PEPTIDE - Abnormal; Notable for the following components:   B Natriuretic Peptide 1,092.7 (*)    All other components  within normal limits  BLOOD GAS, VENOUS - Abnormal; Notable for the following components:   Acid-base deficit 3.3 (*)    All other components within normal limits  TROPONIN I (HIGH SENSITIVITY) - Abnormal; Notable for the following components:   Troponin I (High Sensitivity) 79 (*)    All other components within normal limits  TROPONIN I (HIGH SENSITIVITY) - Abnormal; Notable for the following components:   Troponin I (High Sensitivity) 78 (*)    All other components within normal limits  RESP PANEL BY RT-PCR (RSV, FLU A&B, COVID)  [id number]  URINALYSIS, ROUTINE W REFLEX MICROSCOPIC     EKG  ED ECG REPORT I, Arta Silence, the attending physician, personally viewed and interpreted this ECG.  Date: 09/28/2022 EKG Time: 2316 Rate: 87 Rhythm: normal sinus rhythm QRS Axis: normal Intervals: normal ST/T Wave abnormalities: normal Narrative Interpretation: no evidence of acute ischemia    RADIOLOGY  Chest x-ray: I independently viewed and interpreted the images; there is cardiomegaly with no focal consolidation or obvious  edema   PROCEDURES:  Critical Care performed: No  Procedures   MEDICATIONS ORDERED IN ED: Medications  labetalol (NORMODYNE) injection 5 mg (5 mg Intravenous Given 09/28/22 0433)  furosemide (LASIX) injection 80 mg (80 mg Intravenous Given 09/28/22 0430)     IMPRESSION / MDM / ASSESSMENT AND PLAN / ED COURSE  I reviewed the triage vital signs and the nursing notes.  62 year old male with PMH as noted above including CHF and ESRD presents with acutely worsening shortness of breath over the last few hours as well as cough for several days.  He was started on doxycycline on 1/21.  Exam reveals some rales and lower extremity edema as well as hypertension.  Differential diagnosis includes, but is not limited to, acute CHF, fluid overload due to ESRD, pneumonia, bronchitis.  Initial lab workup reveals BNP over 1000 which is consistent with CHF and fluid overload.  Troponin is slightly elevated compared to baseline as well.  Potassium is 5.2.  We will give Lasix, labetalol for the blood pressure, and reassess.  Anticipate the patient may require admission.  Patient's presentation is most consistent with acute presentation with potential threat to life or bodily function.  The patient is on the cardiac monitor to evaluate for evidence of arrhythmia and/or significant heart rate changes.  ----------------------------------------- 6:49 AM on 09/28/2022 -----------------------------------------  The patient has urinated.  He reports that the breathing is slightly improved.  His blood pressure is slightly improved as well.  However he continues to have increased respiratory effort, frequent coughing, and appears uncomfortable.  VBG does not show any significant hypercapnia.  Given the patient's work of breathing and acutely worsening shortness of breath this evening, he will benefit from admission for further workup and management.  He likely will need dialysis in the hospital today.  I  consulted Dr. Damita Dunnings from the hospitalist service; based our discussion she agrees to admit the patient.  FINAL CLINICAL IMPRESSION(S) / ED DIAGNOSES   Final diagnoses:  Acute on chronic congestive heart failure, unspecified heart failure type ([place])     Rx / DC Orders   ED Discharge Orders     None        Note:  This document was prepared using Dragon voice recognition software and may include unintentional dictation errors.    Arta Silence, MD 09/28/22 [phone]

## 2022-09-28 NOTE — Progress Notes (Signed)
[facility]  ROUNDING NOTE   Subjective:   Steve Vance  is a 62 year old male with past medical conditions including type 2 diabetes, hypertension, dyslipidemia, systolic heart failure with EF 45 to 50% and end-stage renal disease on hemodialysis. Patient presents to the emergency department for shortness of breath. Patient has been admitted for CHF exacerbation ([place]) [I50.9] Acute on chronic congestive heart failure, unspecified heart failure type ([place]) [I50.9]  Patient is known to our practice and receives outpatient dialysis treatments at [facility] on a TTS schedule, supervised by Dr. Candiss Norse.  Patient states not missing any recent treatments, last treatment received on Saturday.  After evaluating outpatient records, patient has began terminating treatments early about twice a week.  Patient seen sitting in recliner in emergency department.  Somnolent but arousable for short times.  Unable to answer any questions at this time.  Chart review used to obtain history.  Patient stated on ED arrival that shortness of breath began last night and has progressively worsened.  Also endorses lower extremity edema and cough for several days.  Recently was seen at urgent care and diagnosed with presumed pneumonia and was given antibiotics.  Patient states he experienced diarrhea while taking antibiotics.  Lab results indicate sodium 134, potassium 5.2, BUN 55, creatinine 8.23 with GFR 7, calcium 8.1, anion gap 17, BNP greater than 1000, troponin 79, and hemoglobin 10.6. Respiratory panel negative for Influenza, covid 19 and RSV. Chest xray negative.   We have been consulted to manage dialysis needs during this admission.   Objective:  Vital signs in last 24 hours:  Temp:  [97.5 F (36.4 C)-98.6 F (37 C)] 98.6 F (37 C) (01/23 0952) Pulse Rate:  [75-89] 83 (01/23 1300) Resp:  [12-23] 14 (01/23 1300) BP: (137-209)/(85-127) 194/101 (01/23 1300) SpO2:  [94 %-100 %] 97 % (01/23  1300) Weight:  [108.6 kg-120.1 kg] 120.1 kg (01/23 1004)  Weight change:  Filed Weights   09/27/22 2328 09/28/22 1004  Weight: 108.6 kg 120.1 kg    Intake/Output: No intake/output data recorded.   Intake/Output this shift:  No intake/output data recorded.  Physical Exam: General: NAD  Head: Normocephalic, atraumatic. Moist oral mucosal membranes  Eyes: Anicteric  Lungs:  Rhonchi, non productive cough, room air  Heart: Regular rate and rhythm  Abdomen:  Soft, nontender,   Extremities:  1+ peripheral edema.  Neurologic: Somnolent, moving all four extremities  Skin: No lesions  Access: Lt AVF    Basic Metabolic Panel: Recent Labs  Lab 09/27/22 2322 09/28/22 1016  NA 134* 133*  K 5.2* 5.1  CL 95* 97*  CO2 22 19*  GLUCOSE 98 83  BUN 55* 60*  CREATININE 8.23* 8.68*  CALCIUM 8.1* 8.0*  PHOS  --  8.3*    Liver Function Tests: Recent Labs  Lab 09/27/22 2322 09/28/22 1016  AST 28  --   ALT 17  --   ALKPHOS 117  --   BILITOT 1.5*  --   PROT 9.1*  --   ALBUMIN 3.6 3.5   No results for input(s): "LIPASE", "AMYLASE" in the last 168 hours. No results for input(s): "AMMONIA" in the last 168 hours.  CBC: Recent Labs  Lab 09/27/22 2322  WBC 4.1  NEUTROABS 2.9  HGB 10.6*  HCT 34.4*  MCV 90.3  PLT 163    Cardiac Enzymes: No results for input(s): "CKTOTAL", "CKMB", "CKMBINDEX", "TROPONINI" in the last 168 hours.  BNP: Invalid input(s): "POCBNP"  CBG: No results for input(s): "GLUCAP"  in the last 168 hours.  Microbiology: Results for orders placed or performed during the hospital encounter of 09/28/22  Resp panel by RT-PCR (RSV, Flu A&B, Covid) Anterior Nasal Swab     Status: None   Collection Time: 09/28/22  4:16 AM   Specimen: Anterior Nasal Swab  Result Value Ref Range Status   SARS Coronavirus 2 by RT PCR NEGATIVE NEGATIVE Final    Comment: (NOTE) SARS-CoV-2 target nucleic acids are NOT DETECTED.  The SARS-CoV-2 RNA is generally detectable in  upper respiratory specimens during the acute phase of infection. The lowest concentration of SARS-CoV-2 viral copies this assay can detect is 138 copies/mL. A negative result does not preclude SARS-Cov-2 infection and should not be used as the sole basis for treatment or other patient management decisions. A negative result may occur with  improper specimen collection/handling, submission of specimen other than nasopharyngeal swab, presence of viral mutation(s) within the areas targeted by this assay, and inadequate number of viral copies(<138 copies/mL). A negative result must be combined with clinical observations, patient history, and epidemiological information. The expected result is Negative.  Fact Sheet for Patients:  BloggerCourse.com  Fact Sheet for Healthcare Providers:  SeriousBroker.it  This test is no t yet approved or cleared by the [place] FDA and  has been authorized for detection and/or diagnosis of SARS-CoV-2 by FDA under an Emergency Use Authorization (EUA). This EUA will remain  in effect (meaning this test can be used) for the duration of the COVID-19 declaration under Section 564(b)(1) of the Act, 21 U.S.C.section 360bbb-3(b)(1), unless the authorization is terminated  or revoked sooner.       Influenza A by PCR NEGATIVE NEGATIVE Final   Influenza B by PCR NEGATIVE NEGATIVE Final    Comment: (NOTE) The Xpert Xpress SARS-CoV-2/FLU/RSV plus assay is intended as an aid in the diagnosis of influenza from Nasopharyngeal swab specimens and should not be used as a sole basis for treatment. Nasal washings and aspirates are unacceptable for Xpert Xpress SARS-CoV-2/FLU/RSV testing.  Fact Sheet for Patients: BloggerCourse.com  Fact Sheet for Healthcare Providers: SeriousBroker.it  This test is not yet approved or cleared by the [place] FDA and has been  authorized for detection and/or diagnosis of SARS-CoV-2 by FDA under an Emergency Use Authorization (EUA). This EUA will remain in effect (meaning this test can be used) for the duration of the COVID-19 declaration under Section 564(b)(1) of the Act, 21 U.S.C. section 360bbb-3(b)(1), unless the authorization is terminated or revoked.     Resp Syncytial Virus by PCR NEGATIVE NEGATIVE Final    Comment: (NOTE) Fact Sheet for Patients: BloggerCourse.com  Fact Sheet for Healthcare Providers: SeriousBroker.it  This test is not yet approved or cleared by the [place] FDA and has been authorized for detection and/or diagnosis of SARS-CoV-2 by FDA under an Emergency Use Authorization (EUA). This EUA will remain in effect (meaning this test can be used) for the duration of the COVID-19 declaration under Section 564(b)(1) of the Act, 21 U.S.C. section 360bbb-3(b)(1), unless the authorization is terminated or revoked.  Performed at [facility], [address] [place], [place] [address]     Coagulation Studies: No results for input(s): "LABPROT", "INR" in the last 72 hours.  Urinalysis: No results for input(s): "COLORURINE", "LABSPEC", "PHURINE", "GLUCOSEU", "HGBUR", "BILIRUBINUR", "KETONESUR", "PROTEINUR", "UROBILINOGEN", "NITRITE", "LEUKOCYTESUR" in the last 72 hours.  Invalid input(s): "APPERANCEUR"    Imaging: DG Chest 1 View  Result Date: 09/28/2022 CLINICAL DATA:  Dyspnea EXAM: CHEST  1 VIEW COMPARISON:  09/26/2022 FINDINGS: Linear scarring within the right lung base. Lungs are otherwise clear. No pneumothorax or pleural effusion. Stable cardiomegaly. Pulmonary vascularity is normal. Vascular stent again noted within the expected left cephalic arch. Left glenoid ORIF has been performed. Degenerative changes are noted within the shoulders. No acute bone abnormality. IMPRESSION: 1. No active disease. 2. Stable  cardiomegaly. Electronically Signed   By: Fidela Salisbury M.D.   On: 09/28/2022 01:28     Medications:    sodium chloride     anticoagulant sodium citrate      amLODipine  10 mg Oral Daily   atorvastatin  10 mg Oral Daily   calcium acetate  667 mg Oral TID WC   carvedilol  6.25 mg Oral BID WC   Chlorhexidine Gluconate Cloth  6 each Topical Q0600   famotidine  20 mg Oral Daily   feeding supplement (NEPRO CARB STEADY)  237 mL Oral BID BM   heparin  5,000 Units Subcutaneous Q8H   isosorbide mononitrate  30 mg Oral Daily   lisinopril  40 mg Oral Daily   multivitamin  1 tablet Oral QHS   sodium chloride flush  3 mL Intravenous Q12H   sodium chloride, albuterol, alteplase, anticoagulant sodium citrate, diphenhydrAMINE-zinc acetate, heparin, HYDROcodone-acetaminophen, lidocaine (PF), lidocaine-prilocaine, ondansetron **OR** ondansetron (ZOFRAN) IV, pentafluoroprop-tetrafluoroeth, sodium chloride flush  Assessment/ Plan:  Steve Vance is a 62 y.o.  male ith past medical conditions including type 2 diabetes, hypertension, dyslipidemia, systolic heart failure with EF 45 to 50% and end-stage renal disease on hemodialysis. Patient presents to the emergency department for shortness of breath. Patient has been admitted for CHF exacerbation ([place]) [I50.9] Acute on chronic congestive heart failure, unspecified heart failure type ([place]) [I50.9]   End stage renal disease on hemodialysis. Last treatment completed on Saturday, however treatment terminated early. Chest xray negative for acute changes. Receiving treatment today, UF goal 2-2.5L as tolerated. Next treatment scheduled for Thursday.   2. Anemia of chronic kidney disease  Lab Results  Component Value Date   HGB 10.6 (L) 09/27/2022    Hgb acceptable for renal patient. No need for ESA's at this time.   3. Secondary Hyperparathyroidism: with outpatient labs: PTH 840, phosphorus 9.2, calcium 8.6 on 09/16/22.   Lab Results  Component Value  Date   PTH 301 (H) 09/24/2020   CALCIUM 8.0 (L) 09/28/2022   CAION 1.21 06/27/2019   PHOS 8.3 (H) 09/28/2022    Phosphorus elevated. Prescribed calcium acetate with meals.   4. Hypertension with chronic kidney disease. Home regimen includes amlodipine, carvedilol, isosorbide, and lisinopril. All currently prescribed.    LOS: 0   1/23/20241:20 PM

## 2022-09-28 NOTE — Assessment & Plan Note (Addendum)
Patient presented for evaluation of sudden onset shortness of breath, markedly elevated blood pressure concerning for flash pulmonary edema Last known LVEF of 45 to 50% from a 2D echocardiogram which was done 07/23 Optimize blood pressure control Continue carvedilol, lisinopril Nephrology consulted for renal replacement therapy

## 2022-09-28 NOTE — Progress Notes (Signed)
Pt ran for 3.5hrs and removed 2.5kg    09/28/22 1428  Vitals  Temp (!) 97.5 F (36.4 C)  Temp Source Oral  BP (!) 190/96  MAP (mmHg) 119  BP Location Right Arm  BP Method Automatic  Patient Position (if appropriate) Lying  Pulse Rate 87  Pulse Rate Source Monitor  ECG Heart Rate 86  Resp 14  Oxygen Therapy  SpO2 98 %  O2 Device Room Air  Patient Activity (if Appropriate) In chair  Pulse Oximetry Type Continuous  During Treatment Monitoring  HD Safety Checks Performed Yes  Intra-Hemodialysis Comments Tx completed;Tolerated well  Post Treatment  Dialyzer Clearance Lightly streaked  Duration of HD Treatment -hour(s) 3.5 hour(s)  Liters Processed 59.8  Fluid Removed (mL) 2500 mL  Tolerated HD Treatment Yes  Post-Hemodialysis Comments tx completed  AVG/AVF Arterial Site Held (minutes) 10 minutes  AVG/AVF Venous Site Held (minutes) 10 minutes  Fistula / Graft Left Upper arm Arteriovenous fistula  No placement date or time found.   Placed prior to admission: Yes  Orientation: Left  Access Location: Upper arm  Access Type: Arteriovenous fistula  Site Condition No complications  Fistula / Graft Assessment Thrill;Present;Bruit  Status Deaccessed;Patent  Drainage Description None

## 2022-09-28 NOTE — Discharge Planning (Signed)
[facility]  [address], [place] [address] [phone]  Scheduled Days Tuesday Thursday and Saturday   Treatment Time: 10:45am  Above schedule confirmed with Meribeth Mattes RN.

## 2022-09-28 NOTE — Assessment & Plan Note (Signed)
H&H is stable Monitor closely during this admission

## 2022-09-28 NOTE — Assessment & Plan Note (Signed)
Patient has a history of diabetes mellitus with complications of end-stage renal disease on hemodialysis Dialysis days are Tuesday/Thursday/Saturday Nephrology consult for renal replacement therapy  Check blood sugars with meals

## 2022-09-28 NOTE — ED Notes (Signed)
Pt noted to be yelling out loudly from room constantly saying " Please help me Lord." Each time this nurse and other staff in room to assist pt, pt just says " I just can't get comfortable." When asked how staff can help pt get comfortable pt offers no solutions and continues to repeat that he just can't get comfortable. Pt noted to be up in room with use of walker, removing monitoring in attempts ' to get comfortable."  Pt educated on safety risk of being up in room without assistance, but pt continues to stand up with walker then sit back on bed for several seconds, then stand back up.  Attempts to educate pt on potential safety risks and potential for falling made multiple times. MD Siadecki aware of events.
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
## 2022-09-28 NOTE — Assessment & Plan Note (Signed)
Probably antibiotic induced Send stool for C. difficile toxin

## 2022-09-28 NOTE — ED Notes (Signed)
This RN to bedside. Pt assisted into bed and connected to monitoring. Pt provided with pillow per request, denies further needs. Bed is locked and in lowest position, 1/2 siderails engaged. Call light in reach.

## 2022-09-28 NOTE — Assessment & Plan Note (Signed)
BMI 33 Complicates overall and care Lifestyle modification and exercise has been discussed in detail

## 2022-09-28 NOTE — ED Notes (Signed)
Patient provided with fan and ice chips per request and comfort

## 2022-09-28 NOTE — ED Notes (Signed)
Provider made aware of current BP, awaiting orders.

## 2022-09-28 NOTE — Assessment & Plan Note (Addendum)
Optimize blood pressure control Continue amlodipine, carvedilol, nitrates and lisinopril

## 2022-09-28 NOTE — Progress Notes (Signed)
Patient ID: Steve [place], male   DOB: 05-11-1961, 62 y.o.   MRN: [id number] Patient arrived to room and stated he was leaving AMA. NT, RN and Agricultural consultant educated him about his vital signs :  BP (!) 203/116 (BP Location: Right Arm)   Pulse 87   Temp 98.7 F (37.1 C)   Resp 14   Ht $R'5\' 11"'IQ$  (1.803 m)   Wt 120.1 kg   SpO2 100%   BMI 36.93 kg/m   and medical diagnosis:  CHF exacerbation (HCC) [I50.9] Acute on chronic congestive heart failure, unspecified heart failure type ([place]) [I50.9] . Reviewed complications that may arise from these, including hypoxia, stroke death.  Patient still adamant about leaving. MD paged and aware. Patient going to lobby, sister to drive home.  Haydee Salter, RN

## 2022-10-01 LAB — HEPATITIS B SURFACE ANTIBODY, QUANTITATIVE: Hep B S AB Quant (Post): 5.5 m[IU]/mL — ABNORMAL LOW (ref >9.9)

## 2022-10-12 ENCOUNTER — Encounter: Payer: Self-pay | Admitting: Emergency Medicine

## 2022-10-12 ENCOUNTER — Emergency Department: Payer: 59

## 2022-10-12 ENCOUNTER — Observation Stay
Admission: EM | Admit: 2022-10-12 | Discharge: 2022-10-12 | Discharge status: Against Medical Advice | Payer: 59 | Attending: Internal Medicine | Admitting: Internal Medicine

## 2022-10-12 DIAGNOSIS — Z1152 Encounter for screening for COVID-19: Secondary | ICD-10-CM | POA: Diagnosis not present

## 2022-10-12 DIAGNOSIS — D631 Anemia in chronic kidney disease: Secondary | ICD-10-CM | POA: Diagnosis not present

## 2022-10-12 DIAGNOSIS — I5043 Acute on chronic combined systolic (congestive) and diastolic (congestive) heart failure: Principal | ICD-10-CM | POA: Insufficient documentation

## 2022-10-12 DIAGNOSIS — I509 Heart failure, unspecified: Secondary | ICD-10-CM

## 2022-10-12 DIAGNOSIS — E1122 Type 2 diabetes mellitus with diabetic chronic kidney disease: Secondary | ICD-10-CM | POA: Insufficient documentation

## 2022-10-12 DIAGNOSIS — I161 Hypertensive emergency: Secondary | ICD-10-CM | POA: Insufficient documentation

## 2022-10-12 DIAGNOSIS — Z79899 Other long term (current) drug therapy: Secondary | ICD-10-CM | POA: Insufficient documentation

## 2022-10-12 DIAGNOSIS — Z992 Dependence on renal dialysis: Secondary | ICD-10-CM | POA: Diagnosis not present

## 2022-10-12 DIAGNOSIS — I132 Hypertensive heart and chronic kidney disease with heart failure and with stage 5 chronic kidney disease, or end stage renal disease: Secondary | ICD-10-CM | POA: Diagnosis not present

## 2022-10-12 DIAGNOSIS — R0602 Shortness of breath: Secondary | ICD-10-CM | POA: Diagnosis present

## 2022-10-12 DIAGNOSIS — N186 End stage renal disease: Secondary | ICD-10-CM | POA: Diagnosis not present

## 2022-10-12 LAB — CBC
HCT: 35.1 % — ABNORMAL LOW (ref 39.0–52.0)
Hemoglobin: 10.8 g/dL — ABNORMAL LOW (ref 13.0–17.0)
MCH: 27.3 pg (ref 26.0–34.0)
MCHC: 30.8 g/dL (ref 30.0–36.0)
MCV: 88.9 fL (ref 80.0–100.0)
Platelets: 169 10*3/uL (ref 150–400)
RBC: 3.95 MIL/uL — ABNORMAL LOW (ref 4.22–5.81)
RDW: 17 % — ABNORMAL HIGH (ref 11.5–15.5)
WBC: 5.5 10*3/uL (ref 4.0–10.5)
nRBC: 0 % (ref 0.0–0.2)

## 2022-10-12 LAB — BASIC METABOLIC PANEL
Anion gap: 15 (ref 5–15)
BUN: 54 mg/dL — ABNORMAL HIGH (ref 8–23)
CO2: 23 mmol/L (ref 22–32)
Calcium: 8.5 mg/dL — ABNORMAL LOW (ref 8.9–10.3)
Chloride: 97 mmol/L — ABNORMAL LOW (ref 98–111)
Creatinine, Ser: 7.03 mg/dL — ABNORMAL HIGH (ref 0.61–1.24)
GFR, Estimated: 8 mL/min — ABNORMAL LOW (ref >60)
Glucose, Bld: 106 mg/dL — ABNORMAL HIGH (ref 70–99)
Potassium: 4.7 mmol/L (ref 3.5–5.1)
Sodium: 135 mmol/L (ref 135–145)

## 2022-10-12 LAB — TROPONIN I (HIGH SENSITIVITY)
Troponin I (High Sensitivity): 73 ng/L — ABNORMAL HIGH (ref <18)
Troponin I (High Sensitivity): 80 ng/L — ABNORMAL HIGH (ref <18)

## 2022-10-12 LAB — RESP PANEL BY RT-PCR (RSV, FLU A&B, COVID)  RVPGX2
Influenza A by PCR: NEGATIVE
Influenza B by PCR: NEGATIVE
Resp Syncytial Virus by PCR: NEGATIVE
SARS Coronavirus 2 by RT PCR: NEGATIVE

## 2022-10-12 LAB — BRAIN NATRIURETIC PEPTIDE: B Natriuretic Peptide: 2211.4 pg/mL — ABNORMAL HIGH (ref 0.0–100.0)

## 2022-10-12 MED ORDER — FUROSEMIDE 10 MG/ML IJ SOLN: 40.0000 mg | Freq: Two times a day (BID) | INTRAMUSCULAR | Status: DC

## 2022-10-12 MED ORDER — ACETAMINOPHEN 325 MG PO TABS: 650.0000 mg | ORAL_TABLET | Freq: Four times a day (QID) | ORAL | Status: DC | PRN

## 2022-10-12 MED ORDER — NEPRO/CARBSTEADY PO LIQD: 237.0000 mL | Freq: Two times a day (BID) | ORAL | Status: DC

## 2022-10-12 MED ORDER — ONDANSETRON HCL 4 MG/2ML IJ SOLN: 4.0000 mg | Freq: Four times a day (QID) | INTRAMUSCULAR | Status: DC | PRN

## 2022-10-12 MED ORDER — CALCIUM ACETATE (PHOS BINDER) 667 MG PO CAPS
667.0000 mg | ORAL_CAPSULE | Freq: Three times a day (TID) | ORAL | Status: DC
  Filled 2022-10-12 (×2): qty 1

## 2022-10-12 MED ORDER — CARVEDILOL 6.25 MG PO TABS
6.2500 mg | ORAL_TABLET | Freq: Two times a day (BID) | ORAL | Status: DC
  Filled 2022-10-12: qty 1

## 2022-10-12 MED ORDER — NITROGLYCERIN 2 % TD OINT
1.0000 [in_us] | TOPICAL_OINTMENT | Freq: Four times a day (QID) | TRANSDERMAL | Status: DC
  Administered 2022-10-12: 1 [in_us] via TOPICAL
  Filled 2022-10-12: qty 1

## 2022-10-12 MED ORDER — LISINOPRIL 10 MG PO TABS
40.0000 mg | ORAL_TABLET | Freq: Every day | ORAL | Status: DC
  Filled 2022-10-12: qty 4

## 2022-10-12 MED ORDER — ACETAMINOPHEN 500 MG PO TABS
1000.0000 mg | ORAL_TABLET | Freq: Once | ORAL | Status: AC
  Administered 2022-10-12: 1000 mg via ORAL
  Filled 2022-10-12: qty 2

## 2022-10-12 MED ORDER — FUROSEMIDE 10 MG/ML IJ SOLN
40.0000 mg | Freq: Once | INTRAMUSCULAR | Status: AC
  Administered 2022-10-12: 40 mg via INTRAVENOUS
  Filled 2022-10-12: qty 4

## 2022-10-12 MED ORDER — ISOSORBIDE MONONITRATE ER 60 MG PO TB24
30.0000 mg | ORAL_TABLET | Freq: Every day | ORAL | Status: DC
  Filled 2022-10-12: qty 1

## 2022-10-12 MED ORDER — HEPARIN SODIUM (PORCINE) 5000 UNIT/ML IJ SOLN
5000.0000 [IU] | Freq: Three times a day (TID) | INTRAMUSCULAR | Status: DC
  Administered 2022-10-12: 5000 [IU] via SUBCUTANEOUS
  Filled 2022-10-12: qty 1

## 2022-10-12 MED ORDER — RENA-VITE PO TABS: 1.0000 | ORAL_TABLET | Freq: Every day | ORAL | Status: DC

## 2022-10-12 MED ORDER — FAMOTIDINE 20 MG PO TABS
20.0000 mg | ORAL_TABLET | Freq: Every day | ORAL | Status: DC
  Filled 2022-10-12: qty 1

## 2022-10-12 MED ORDER — ONDANSETRON HCL 4 MG PO TABS: 4.0000 mg | ORAL_TABLET | Freq: Four times a day (QID) | ORAL | Status: DC | PRN

## 2022-10-12 MED ORDER — CHLORHEXIDINE GLUCONATE CLOTH 2 % EX PADS
6.0000 | MEDICATED_PAD | Freq: Every day | CUTANEOUS | Status: DC
  Filled 2022-10-12: qty 6

## 2022-10-12 MED ORDER — AMLODIPINE BESYLATE 5 MG PO TABS
10.0000 mg | ORAL_TABLET | Freq: Every day | ORAL | Status: DC
  Filled 2022-10-12: qty 2

## 2022-10-12 MED ORDER — HEPARIN SODIUM (PORCINE) 1000 UNIT/ML DIALYSIS: 20.0000 [IU]/kg | INTRAMUSCULAR | Status: DC | PRN

## 2022-10-12 MED ORDER — ATORVASTATIN CALCIUM 20 MG PO TABS
10.0000 mg | ORAL_TABLET | Freq: Every day | ORAL | Status: DC
  Filled 2022-10-12: qty 1

## 2022-10-12 MED ORDER — HYDROCODONE-ACETAMINOPHEN 5-325 MG PO TABS: 1.0000 | ORAL_TABLET | ORAL | Status: DC | PRN

## 2022-10-12 MED ORDER — ACETAMINOPHEN 325 MG RE SUPP: 650.0000 mg | Freq: Four times a day (QID) | RECTAL | Status: DC | PRN

## 2022-10-12 NOTE — ED Notes (Signed)
Transport here to transport pt to dialysis, pt refusing transport to dialysis and pt states "I want to go to [facility]". Pt educated that he was admitted and dialysis is done on site and not outpatient due to admission. Pt starts yelling at this RN "I want to go". Pt also yelling at his S/O at bedside.   Pt refusing meds at this time.
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
## 2022-10-12 NOTE — ED Notes (Signed)
This RN brought pt sandwich tray, snacks, and drinks per his request.

## 2022-10-12 NOTE — ED Notes (Signed)
Pt refusing all meds and keeps stating "I want to leave". Nephro NP at bedside.  Pt leaving AMA, AMA paper signed. Wife educated he has an apt at outpt dialysis at 31, both verbalized understanding. Pt still appears SOB, risks and benefits discussed with pt and S/O both verbalized understanding.   Pt wheeled out of ED by [facility] by EDT.

## 2022-10-12 NOTE — ED Notes (Signed)
Report to Lorriane Shire at Dialysis

## 2022-10-12 NOTE — ED Notes (Signed)
This RN assisted pt to recliner per his request.  Pt agreed to keep monitoring equipment on and stay in chair for safety. Pt attempted to use urinal with no output at this time.  GF at bedside.

## 2022-10-12 NOTE — ED Notes (Signed)
Patient transported to X-ray 

## 2022-10-12 NOTE — Progress Notes (Signed)
No charge AM fu note. Admit with HTN urgency and concern for flash pulm edema, given lasix and nitropaste. Seen and examined in ER this AM. To be seen by nephrology today for his scheduled HD. BP elevated, due for AM meds at 10am.
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
## 2022-10-12 NOTE — ED Notes (Signed)
Pt noted to be in W/C and in middle of room away from monitoring equipment.  Refusing to stay in bed or on monitoring equipment.  Pt states that he needs to sit up.  Pt educated that the bed sits up straight and he can sit on side of bed as needed and still be on monitoring equipment. Pt continues to sit in W/C at this time.

## 2022-10-12 NOTE — ED Notes (Addendum)
Report to dialysis   Consent filled out and signed by pt.

## 2022-10-12 NOTE — Discharge Summary (Signed)
Discharge Summary  Steve Vance JOA:[id number] DOB: 05-Jul-1961  PCP: Theotis Burrow, MD  Admit date: 10/12/2022 Discharge date: 10/12/2022  Recommendations for Outpatient Follow-up:  Please follow up for HD today at [facility]. Note Patient left AMA before HD and no other instructions were given.  Discharge Diagnoses:  Active Hospital Problems   Diagnosis Date Noted   CHF exacerbation ([place]) 09/28/2022   Acute on chronic combined systolic and diastolic CHF (congestive heart failure) ([place]) 09/28/2022    Priority: 1.   Hypertensive emergency 03/29/2019    Priority: 1.   Diabetes mellitus with ESRD (end-stage renal disease) ([place]) 02/04/2015    Priority: 2.   End-stage renal disease on hemodialysis ([place]) 11/14/2019    Priority: 3.    Resolved Hospital Problems  No resolved problems to display.   Discharge Condition: Stable but guarded.   Diet recommendation: Diet Orders (From admission, onward)     Start     Ordered   10/12/22 0359  Diet Heart Room service appropriate? Yes; Fluid consistency: Thin  Diet effective now       Question Answer Comment  Room service appropriate? Yes   Fluid consistency: Thin      10/12/22 0359           HPI and Brief Hospital Course:  Steve Vance is a 62 y.o. male with medical history significant for ESRD on HD TTS, anemia of CKD, baseline 8-11, HTN, DM ,combined systolic and diastolic dysfunction CHF with last known LVEF of 45 - 50%, obesity, hypertension, anxiety disorder, admitted on 09/28/2022 for CHF exacerbation, signing out AMA the same day who presents to the ED with shortness of breath that started a few hours prior to presentation associated with orthopnea and lower extremity edema.  He denies missing any of his dialysis sessions.  Denies cough fever or chills.  Does have nasal congestion.   He was admitted to hospitalist service, given Lasix, nitropaste. Seen by nephrology with plans for HD but got upset and left AMA before HD.    Discharge details, plan of care and follow up instructions were discussed with patient and any available family or care providers. Patient and family are in agreement with discharge from the hospital today and all questions were answered to their satisfaction.  Discharge Exam: BP (!) 188/115 (BP Location: Right Arm)   Pulse (!) 109   Temp 98.8 F (37.1 C) (Oral)   Resp 19   Ht $R'5\' 11"'nh$  (1.803 m)   Wt 122 kg   SpO2 98%   BMI 37.51 kg/m  See Admit H&P exam.  Discharge Instructions You were cared for by a hospitalist during your hospital stay. If you have any questions about your discharge medications or the care you received while you were in the hospital after you are discharged, you can call the unit and asked to speak with the hospitalist on call if the hospitalist that took care of you is not available. Once you are discharged, your primary care physician will handle any further medical issues. Please note that NO REFILLS for any discharge medications will be authorized once you are discharged, as it is imperative that you return to your primary care physician (or establish a relationship with a primary care physician if you do not have one) for your aftercare needs so that they can reassess your need for medications and monitor your lab values.    Allergies  Allergen Reactions   Baclofen Nausea And Vomiting   Gabapentin Nausea And Vomiting  Oxycodone-Acetaminophen Nausea And Vomiting     The results of significant diagnostics from this hospitalization (including imaging, microbiology, ancillary and laboratory) are listed below for reference.    Significant Diagnostic Studies: DG Chest 2 View  Result Date: 10/12/2022 CLINICAL DATA:  Shortness of breath EXAM: CHEST - 2 VIEW COMPARISON:  09/28/2022 FINDINGS: Cardiomegaly, vascular congestion. Interstitial prominence throughout the lungs, likely edema. Bibasilar linear opacities compatible with atelectasis. No effusions. No acute  bony abnormality. IMPRESSION: Cardiomegaly with vascular congestion and mild interstitial edema. Bibasilar atelectasis. Electronically Signed   By: Rolm Baptise M.D.   On: 10/12/2022 02:07   DG Chest 1 View  Result Date: 09/28/2022 CLINICAL DATA:  Dyspnea EXAM: CHEST  1 VIEW COMPARISON:  09/26/2022 FINDINGS: Linear scarring within the right lung base. Lungs are otherwise clear. No pneumothorax or pleural effusion. Stable cardiomegaly. Pulmonary vascularity is normal. Vascular stent again noted within the expected left cephalic arch. Left glenoid ORIF has been performed. Degenerative changes are noted within the shoulders. No acute bone abnormality. IMPRESSION: 1. No active disease. 2. Stable cardiomegaly. Electronically Signed   By: Fidela Salisbury M.D.   On: 09/28/2022 01:28   DG Chest 2 View  Result Date: 09/26/2022 CLINICAL DATA:  Cough for 2 weeks. EXAM: CHEST - 2 VIEW COMPARISON:  04/29/2022 FINDINGS: Cardiac enlargement, stable. No pleural effusion or edema. Platelike atelectasis identified within the right lower lung. No airspace disease. Postsurgical changes noted within the left shoulder. IMPRESSION: Right lower lung platelike atelectasis. Electronically Signed   By: Kerby Moors M.D.   On: 09/26/2022 13:08    Microbiology: Recent Results (from the past 240 hour(s))  Resp panel by RT-PCR (RSV, Flu A&B, Covid) Anterior Nasal Swab     Status: None   Collection Time: 10/12/22  1:16 AM   Specimen: Anterior Nasal Swab  Result Value Ref Range Status   SARS Coronavirus 2 by RT PCR NEGATIVE NEGATIVE Final    Comment: (NOTE) SARS-CoV-2 target nucleic acids are NOT DETECTED.  The SARS-CoV-2 RNA is generally detectable in upper respiratory specimens during the acute phase of infection. The lowest concentration of SARS-CoV-2 viral copies this assay can detect is 138 copies/mL. A negative result does not preclude SARS-Cov-2 infection and should not be used as the sole basis for treatment  or other patient management decisions. A negative result may occur with  improper specimen collection/handling, submission of specimen other than nasopharyngeal swab, presence of viral mutation(s) within the areas targeted by this assay, and inadequate number of viral copies(<138 copies/mL). A negative result must be combined with clinical observations, patient history, and epidemiological information. The expected result is Negative.  Fact Sheet for Patients:  EntrepreneurPulse.com.au  Fact Sheet for Healthcare Providers:  IncredibleEmployment.be  This test is no t yet approved or cleared by the [place] FDA and  has been authorized for detection and/or diagnosis of SARS-CoV-2 by FDA under an Emergency Use Authorization (EUA). This EUA will remain  in effect (meaning this test can be used) for the duration of the COVID-19 declaration under Section 564(b)(1) of the Act, 21 U.S.C.section 360bbb-3(b)(1), unless the authorization is terminated  or revoked sooner.       Influenza A by PCR NEGATIVE NEGATIVE Final   Influenza B by PCR NEGATIVE NEGATIVE Final    Comment: (NOTE) The Xpert Xpress SARS-CoV-2/FLU/RSV plus assay is intended as an aid in the diagnosis of influenza from Nasopharyngeal swab specimens and should not be used as a sole basis for treatment. Nasal washings and  aspirates are unacceptable for Xpert Xpress SARS-CoV-2/FLU/RSV testing.  Fact Sheet for Patients: EntrepreneurPulse.com.au  Fact Sheet for Healthcare Providers: IncredibleEmployment.be  This test is not yet approved or cleared by the [place] FDA and has been authorized for detection and/or diagnosis of SARS-CoV-2 by FDA under an Emergency Use Authorization (EUA). This EUA will remain in effect (meaning this test can be used) for the duration of the COVID-19 declaration under Section 564(b)(1) of the Act, 21 U.S.C. section  360bbb-3(b)(1), unless the authorization is terminated or revoked.     Resp Syncytial Virus by PCR NEGATIVE NEGATIVE Final    Comment: (NOTE) Fact Sheet for Patients: EntrepreneurPulse.com.au  Fact Sheet for Healthcare Providers: IncredibleEmployment.be  This test is not yet approved or cleared by the [place] FDA and has been authorized for detection and/or diagnosis of SARS-CoV-2 by FDA under an Emergency Use Authorization (EUA). This EUA will remain in effect (meaning this test can be used) for the duration of the COVID-19 declaration under Section 564(b)(1) of the Act, 21 U.S.C. section 360bbb-3(b)(1), unless the authorization is terminated or revoked.  Performed at [facility], [address] [place], [place] [address]      Labs: Basic Metabolic Panel: Recent Labs  Lab 10/12/22 0218  NA 135  K 4.7  CL 97*  CO2 23  GLUCOSE 106*  BUN 54*  CREATININE 7.03*  CALCIUM 8.5*   Liver Function Tests: No results for input(s): "AST", "ALT", "ALKPHOS", "BILITOT", "PROT", "ALBUMIN" in the last 168 hours. No results for input(s): "LIPASE", "AMYLASE" in the last 168 hours. No results for input(s): "AMMONIA" in the last 168 hours. CBC: Recent Labs  Lab 10/12/22 0116  WBC 5.5  HGB 10.8*  HCT 35.1*  MCV 88.9  PLT 169   Cardiac Enzymes: No results for input(s): "CKTOTAL", "CKMB", "CKMBINDEX", "TROPONINI" in the last 168 hours. BNP: BNP (last 3 results) Recent Labs    03/04/22 1428 09/27/22 2322 10/12/22 0116  BNP 729.8* 1,092.7* 2,211.4*    ProBNP (last 3 results) No results for input(s): "PROBNP" in the last 8760 hours.  CBG: No results for input(s): "GLUCAP" in the last 168 hours.  Time spent: 10 minutes were spent in preparing this discharge  Signed:  Alexavier Tsutsui Neva Seat, MD  [facility] 10/12/2022, 10:25 AM

## 2022-10-12 NOTE — ED Triage Notes (Signed)
Pt presents via POV with complaints of SOB and "too much fluid" on my chest starting tonight. Hx of Dialysis (T/TR/Sat) and CHF. He notes going to dialysis on Saturday and completed his session. Pt has no O2 requirement - no increased WOB. He notes making small amounts of urine. Denies CP.

## 2022-10-25 ENCOUNTER — Emergency Department
Admission: EM | Admit: 2022-10-25 | Discharge: 2022-10-25 | Disposition: A | Discharge status: Home / Self Care | Payer: 59 | Attending: Emergency Medicine | Admitting: Emergency Medicine

## 2022-10-25 ENCOUNTER — Emergency Department: Payer: 59

## 2022-10-25 DIAGNOSIS — E1122 Type 2 diabetes mellitus with diabetic chronic kidney disease: Secondary | ICD-10-CM | POA: Diagnosis not present

## 2022-10-25 DIAGNOSIS — R4182 Altered mental status, unspecified: Secondary | ICD-10-CM | POA: Insufficient documentation

## 2022-10-25 DIAGNOSIS — N186 End stage renal disease: Secondary | ICD-10-CM | POA: Diagnosis not present

## 2022-10-25 DIAGNOSIS — S80811A Abrasion, right lower leg, initial encounter: Secondary | ICD-10-CM | POA: Insufficient documentation

## 2022-10-25 DIAGNOSIS — M47812 Spondylosis without myelopathy or radiculopathy, cervical region: Secondary | ICD-10-CM | POA: Insufficient documentation

## 2022-10-25 DIAGNOSIS — M25552 Pain in left hip: Secondary | ICD-10-CM | POA: Insufficient documentation

## 2022-10-25 DIAGNOSIS — R0602 Shortness of breath: Secondary | ICD-10-CM

## 2022-10-25 DIAGNOSIS — I132 Hypertensive heart and chronic kidney disease with heart failure and with stage 5 chronic kidney disease, or end stage renal disease: Secondary | ICD-10-CM | POA: Insufficient documentation

## 2022-10-25 DIAGNOSIS — I6782 Cerebral ischemia: Secondary | ICD-10-CM | POA: Diagnosis not present

## 2022-10-25 DIAGNOSIS — I5043 Acute on chronic combined systolic (congestive) and diastolic (congestive) heart failure: Secondary | ICD-10-CM | POA: Diagnosis not present

## 2022-10-25 DIAGNOSIS — Y92410 Unspecified street and highway as the place of occurrence of the external cause: Secondary | ICD-10-CM | POA: Diagnosis not present

## 2022-10-25 DIAGNOSIS — Z992 Dependence on renal dialysis: Secondary | ICD-10-CM | POA: Insufficient documentation

## 2022-10-25 DIAGNOSIS — M79661 Pain in right lower leg: Secondary | ICD-10-CM | POA: Diagnosis present

## 2022-10-25 LAB — CBC WITH DIFFERENTIAL/PLATELET
Abs Immature Granulocytes: 0.02 10*3/uL (ref 0.00–0.07)
Basophils Absolute: 0 10*3/uL (ref 0.0–0.1)
Basophils Relative: 1 %
Eosinophils Absolute: 0.2 10*3/uL (ref 0.0–0.5)
Eosinophils Relative: 4 %
HCT: 34.2 % — ABNORMAL LOW (ref 39.0–52.0)
Hemoglobin: 10.4 g/dL — ABNORMAL LOW (ref 13.0–17.0)
Immature Granulocytes: 0 %
Lymphocytes Relative: 12 %
Lymphs Abs: 0.6 10*3/uL — ABNORMAL LOW (ref 0.7–4.0)
MCH: 26.5 pg (ref 26.0–34.0)
MCHC: 30.4 g/dL (ref 30.0–36.0)
MCV: 87.2 fL (ref 80.0–100.0)
Monocytes Absolute: 0.4 10*3/uL (ref 0.1–1.0)
Monocytes Relative: 9 %
Neutro Abs: 3.6 10*3/uL (ref 1.7–7.7)
Neutrophils Relative %: 74 %
Platelets: 196 10*3/uL (ref 150–400)
RBC: 3.92 MIL/uL — ABNORMAL LOW (ref 4.22–5.81)
RDW: 17.2 % — ABNORMAL HIGH (ref 11.5–15.5)
WBC: 4.9 10*3/uL (ref 4.0–10.5)
nRBC: 0 % (ref 0.0–0.2)

## 2022-10-25 LAB — BLOOD GAS, VENOUS
Acid-Base Excess: 1.9 mmol/L (ref 0.0–2.0)
Bicarbonate: 28.2 mmol/L — ABNORMAL HIGH (ref 20.0–28.0)
O2 Saturation: 80.8 %
Patient temperature: 37
pCO2, Ven: 50 mmHg (ref 44–60)
pH, Ven: 7.36 (ref 7.25–7.43)
pO2, Ven: 53 mmHg — ABNORMAL HIGH (ref 32–45)

## 2022-10-25 LAB — COMPREHENSIVE METABOLIC PANEL
ALT: 16 U/L (ref 0–44)
AST: 25 U/L (ref 15–41)
Albumin: 3.5 g/dL (ref 3.5–5.0)
Alkaline Phosphatase: 143 U/L — ABNORMAL HIGH (ref 38–126)
Anion gap: 15 (ref 5–15)
BUN: 50 mg/dL — ABNORMAL HIGH (ref 8–23)
CO2: 25 mmol/L (ref 22–32)
Calcium: 8.5 mg/dL — ABNORMAL LOW (ref 8.9–10.3)
Chloride: 96 mmol/L — ABNORMAL LOW (ref 98–111)
Creatinine, Ser: 6.25 mg/dL — ABNORMAL HIGH (ref 0.61–1.24)
GFR, Estimated: 9 mL/min — ABNORMAL LOW (ref >60)
Glucose, Bld: 136 mg/dL — ABNORMAL HIGH (ref 70–99)
Potassium: 4.1 mmol/L (ref 3.5–5.1)
Sodium: 136 mmol/L (ref 135–145)
Total Bilirubin: 1.4 mg/dL — ABNORMAL HIGH (ref 0.3–1.2)
Total Protein: 9.3 g/dL — ABNORMAL HIGH (ref 6.5–8.1)

## 2022-10-25 LAB — ETHANOL: Alcohol, Ethyl (B): <10 mg/dL (ref <10)

## 2022-10-25 MED ORDER — CARVEDILOL 6.25 MG PO TABS
6.2500 mg | ORAL_TABLET | Freq: Once | ORAL | Status: AC
  Administered 2022-10-25: 6.25 mg via ORAL
  Filled 2022-10-25: qty 1

## 2022-10-25 MED ORDER — AMLODIPINE BESYLATE 5 MG PO TABS
10.0000 mg | ORAL_TABLET | Freq: Once | ORAL | Status: AC
  Administered 2022-10-25: 10 mg via ORAL
  Filled 2022-10-25: qty 2

## 2022-10-25 MED ORDER — ACETAMINOPHEN 500 MG PO TABS
1000.0000 mg | ORAL_TABLET | Freq: Once | ORAL | Status: AC
  Administered 2022-10-25: 1000 mg via ORAL
  Filled 2022-10-25: qty 2

## 2022-10-25 MED ORDER — ISOSORBIDE MONONITRATE ER 60 MG PO TB24
30.0000 mg | ORAL_TABLET | Freq: Once | ORAL | Status: AC
  Administered 2022-10-25: 30 mg via ORAL
  Filled 2022-10-25: qty 1

## 2022-10-25 MED ORDER — LISINOPRIL 10 MG PO TABS
40.0000 mg | ORAL_TABLET | Freq: Once | ORAL | Status: AC
  Administered 2022-10-25: 40 mg via ORAL
  Filled 2022-10-25: qty 4

## 2022-10-25 MED ORDER — IOHEXOL 350 MG/ML SOLN
100.0000 mL | Freq: Once | INTRAVENOUS | Status: AC | PRN
  Administered 2022-10-25: 100 mL via INTRAVENOUS

## 2022-10-25 NOTE — Discharge Instructions (Signed)
As we discussed I'd recommend staying for dialysis.  However, you can leave now just be sure to go to dialysis in the morning.  Take over-the-counter tylenol for pain.  Avoid anything stronger as it could make your breathing worse.
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
## 2022-10-25 NOTE — ED Notes (Signed)
This RN assisted the pt in trying to call family several times with no luck of getting anyone on the phone to speak with the pt. Will try again in a little bit.

## 2022-10-25 NOTE — ED Notes (Signed)
Pt refusing meds without food.
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
## 2022-10-25 NOTE — ED Notes (Signed)
Dressing applied to small scrape on lower right leg.
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
## 2022-10-25 NOTE — ED Notes (Signed)
IV blew, removed and applied coban.  X2 unsuccessful IV attempts.  Straight stick for VBG. Marland Kitchen

## 2022-10-25 NOTE — ED Notes (Signed)
Report given to Ashley RN

## 2022-10-25 NOTE — ED Notes (Signed)
This RN at bedside, pt very drowsy- opens eyes to speech.  Was able to get pt up and out of bed and take a few small steps with walker.  Pt complained of pain in his legs and hip.  Returned to stretcher, pt in NAD.  MD notified.

## 2022-10-26 ENCOUNTER — Emergency Department: Payer: 59

## 2022-10-26 ENCOUNTER — Other Ambulatory Visit: Payer: Self-pay

## 2022-10-26 ENCOUNTER — Emergency Department
Admission: EM | Admit: 2022-10-26 | Discharge: 2022-10-26 | Disposition: A | Discharge status: Home / Self Care | Payer: 59 | Attending: Emergency Medicine | Admitting: Emergency Medicine

## 2022-10-26 ENCOUNTER — Encounter: Payer: Self-pay | Admitting: Emergency Medicine

## 2022-10-26 DIAGNOSIS — N189 Chronic kidney disease, unspecified: Secondary | ICD-10-CM | POA: Diagnosis not present

## 2022-10-26 DIAGNOSIS — Z91158 Patient's noncompliance with renal dialysis for other reason: Secondary | ICD-10-CM | POA: Insufficient documentation

## 2022-10-26 DIAGNOSIS — I120 Hypertensive chronic kidney disease with stage 5 chronic kidney disease or end stage renal disease: Secondary | ICD-10-CM | POA: Insufficient documentation

## 2022-10-26 DIAGNOSIS — Z992 Dependence on renal dialysis: Secondary | ICD-10-CM | POA: Diagnosis not present

## 2022-10-26 DIAGNOSIS — J811 Chronic pulmonary edema: Secondary | ICD-10-CM | POA: Diagnosis not present

## 2022-10-26 LAB — CBC WITH DIFFERENTIAL/PLATELET
Abs Immature Granulocytes: 0.03 10*3/uL (ref 0.00–0.07)
Basophils Absolute: 0 10*3/uL (ref 0.0–0.1)
Basophils Relative: 0 %
Eosinophils Absolute: 0.2 10*3/uL (ref 0.0–0.5)
Eosinophils Relative: 4 %
HCT: 33.6 % — ABNORMAL LOW (ref 39.0–52.0)
Hemoglobin: 10.3 g/dL — ABNORMAL LOW (ref 13.0–17.0)
Immature Granulocytes: 1 %
Lymphocytes Relative: 10 %
Lymphs Abs: 0.5 10*3/uL — ABNORMAL LOW (ref 0.7–4.0)
MCH: 26.5 pg (ref 26.0–34.0)
MCHC: 30.7 g/dL (ref 30.0–36.0)
MCV: 86.4 fL (ref 80.0–100.0)
Monocytes Absolute: 0.4 10*3/uL (ref 0.1–1.0)
Monocytes Relative: 7 %
Neutro Abs: 4.3 10*3/uL (ref 1.7–7.7)
Neutrophils Relative %: 78 %
Platelets: 189 10*3/uL (ref 150–400)
RBC: 3.89 MIL/uL — ABNORMAL LOW (ref 4.22–5.81)
RDW: 17.1 % — ABNORMAL HIGH (ref 11.5–15.5)
WBC: 5.4 10*3/uL (ref 4.0–10.5)
nRBC: 0 % (ref 0.0–0.2)

## 2022-10-26 LAB — COMPREHENSIVE METABOLIC PANEL
ALT: 14 U/L (ref 0–44)
AST: 19 U/L (ref 15–41)
Albumin: 3.3 g/dL — ABNORMAL LOW (ref 3.5–5.0)
Alkaline Phosphatase: 120 U/L (ref 38–126)
Anion gap: 20 — ABNORMAL HIGH (ref 5–15)
BUN: 65 mg/dL — ABNORMAL HIGH (ref 8–23)
CO2: 19 mmol/L — ABNORMAL LOW (ref 22–32)
Calcium: 8.5 mg/dL — ABNORMAL LOW (ref 8.9–10.3)
Chloride: 96 mmol/L — ABNORMAL LOW (ref 98–111)
Creatinine, Ser: 7.6 mg/dL — ABNORMAL HIGH (ref 0.61–1.24)
GFR, Estimated: 7 mL/min — ABNORMAL LOW (ref >60)
Glucose, Bld: 89 mg/dL (ref 70–99)
Potassium: 4.8 mmol/L (ref 3.5–5.1)
Sodium: 135 mmol/L (ref 135–145)
Total Bilirubin: 1.4 mg/dL — ABNORMAL HIGH (ref 0.3–1.2)
Total Protein: 8.8 g/dL — ABNORMAL HIGH (ref 6.5–8.1)

## 2022-10-26 LAB — LIPASE, BLOOD: Lipase: 53 U/L — ABNORMAL HIGH (ref 11–51)

## 2022-10-26 NOTE — ED Notes (Signed)
Pt continuously yelling "Help, oh God help me". This RN requesting pt to stop yelling, pt stating "I can't help it". Pt informed that entire pod can hear him yelling, pt continues to endorse he must yell, and he is upset that he has not been placed on oxygen as he needs it when he sleeps. Pt placed on 2L [place] for comfort although he is not requiring oxygen at this time This RN informed pt that I will close the door so he is not disturbing other patients, pt upset with this. Pt then raising voice and yelling at this RN. Pt informed this RN will return when pt can be respectful. WCTM.

## 2022-10-26 NOTE — ED Provider Notes (Signed)
[facility] Provider Note  Patient Contact: 4:52 PM (approximate)   History   Needs Dialysis   HPI  Steve Vance is a 62 y.o. male presents to the emergency department for dialysis.  He reports that he missed a dialysis session.  He reports that he feels as though he needs oxygen because he has been falling asleep more than usual.  No chest pain, chest tightness or abdominal pain.      Physical Exam   Triage Vital Signs: ED Triage Vitals  Enc Vitals Group     BP 10/26/22 1548 (!) 184/85     Pulse Rate 10/26/22 1548 90     Resp 10/26/22 1548 18     Temp 10/26/22 1548 97.8 F (36.6 C)     Temp Source 10/26/22 1548 Oral     SpO2 10/26/22 1548 95 %     Weight 10/26/22 1647 246 lb 14.6 oz (112 kg)     Height 10/26/22 1647 $RemoveBefor'5\' 11"'[id number]$  (1.803 m)     Head Circumference --      Peak Flow --      Pain Score 10/26/22 1546 5     Pain Loc --      Pain Edu? --      Excl. in [place]? --     Most recent vital signs: Vitals:   10/26/22 1705 10/26/22 1759  BP:    Pulse:  90  Resp: (!) 22   Temp:    SpO2: 97% 100%     General: Alert and in no acute distress. Eyes:  PERRL. EOMI. Head: No acute traumatic findings ENT:      Nose: No congestion/rhinnorhea.      Mouth/Throat: Mucous membranes are moist.  Neck: No stridor. No cervical spine tenderness to palpation.  Cardiovascular:  Good peripheral perfusion Respiratory: Normal respiratory effort without tachypnea or retractions. Lungs CTAB. Good air entry to the bases with no decreased or absent breath sounds. Gastrointestinal: Bowel sounds 4 quadrants. Soft and nontender to palpation. No guarding or rigidity. No palpable masses. No distention. No CVA tenderness. Musculoskeletal: Full range of motion to all extremities.  Neurologic:  No gross focal neurologic deficits are appreciated.  Skin:   No rash noted    ED Results / Procedures / Treatments   Labs (all labs ordered are listed, but only abnormal  results are displayed) Labs Reviewed  CBC WITH DIFFERENTIAL/PLATELET - Abnormal; Notable for the following components:      Result Value   RBC 3.89 (*)    Hemoglobin 10.3 (*)    HCT 33.6 (*)    RDW 17.1 (*)    Lymphs Abs 0.5 (*)    All other components within normal limits  COMPREHENSIVE METABOLIC PANEL - Abnormal; Notable for the following components:   Chloride 96 (*)    CO2 19 (*)    BUN 65 (*)    Creatinine, Ser 7.60 (*)    Calcium 8.5 (*)    Total Protein 8.8 (*)    Albumin 3.3 (*)    Total Bilirubin 1.4 (*)    GFR, Estimated 7 (*)    Anion gap 20 (*)    All other components within normal limits  LIPASE, BLOOD - Abnormal; Notable for the following components:   Lipase 53 (*)    All other components within normal limits     EKG  Normal sinus rhythm without ST segment elevation or other apparent arrhythmia.     PROCEDURES:  Critical Care  performed: No  Procedures   MEDICATIONS ORDERED IN ED: Medications - No data to display   IMPRESSION / MDM / [place] / ED COURSE  I reviewed the triage vital signs and the nursing notes.                              Assessment and plan:   Need for dialysis 62 year old male presents to the emergency department with shortness of breath and increased fatigue.  Patient was hypertensive at triage and tachypneic but vital signs otherwise reassuring.  Patient did state that he felt short of breath despite reassuring O2 saturations in the emergency department.  Patient was placed on 2 L by nasal cannula for comfort.  I did reach out to nephrologist on-call, Dr. Juleen China who states that patient missed his dialysis appointment both today and yesterday.  He states that his dialysis team has left for today but he is going to contact staff to see if dialysis is possible today.  Dr. Juleen China personally evaluated patient at bedside and does not feel that patient needs emergent dialysis at this time.  He recommended outpatient  dialysis for tomorrow.  Patient is agreeable to plan.  All patient questions were answered.   FINAL CLINICAL IMPRESSION(S) / ED DIAGNOSES   Final diagnoses:  Dialysis patient [facility])     Rx / DC Orders   ED Discharge Orders     None        Note:  This document was prepared using Dragon voice recognition software and may include unintentional dictation errors.   Vallarie M[place], Hershal Coria 10/26/22 Rolly Salter, MD 10/26/22 [phone]

## 2022-10-26 NOTE — Discharge Instructions (Addendum)
Dr. Juleen China does not feel you need dialysis tonight.  Please follow up with dialysis center to have dialysis tomorrow.

## 2022-10-26 NOTE — ED Notes (Signed)
Patient's sister felicia contacted at this time. She states that she had some concerns about patient going home by himself. Informed her that if she would like to come up here and speak to the provider about her concerns that would be the most beneficial and if everything checks out she could take him home. Sister states she will head up to the hospital shortly.

## 2022-10-26 NOTE — ED Notes (Signed)
First nurse note-pt brought in via ems from dialysis.  Pt missed dialysis today due to his knee hurting.  Pt requesting dialysis now that he is in the ER.  Pt in recliner in lobby.  Pt alert

## 2022-10-26 NOTE — ED Notes (Signed)
Pt states he is here for dialysis. He missed his last scheduled dialysis.

## 2022-10-26 NOTE — Progress Notes (Signed)
Called by the ED to evaluate patient for hemodialysis. Patient missed his hemodialysis treatment today. Patient without overt fluid overload. No shortness of breath. Labs are stable. Will ask patient to get dialysis as an outpatient at his clinic.   Steve Vance 6:31 PM
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
## 2022-11-07 ENCOUNTER — Emergency Department: Payer: 59

## 2022-11-07 ENCOUNTER — Other Ambulatory Visit: Payer: Self-pay

## 2022-11-07 ENCOUNTER — Inpatient Hospital Stay
Admission: EM | Admit: 2022-11-07 | Discharge: 2022-12-06 | DRG: 291 | Disposition: E | Payer: 59 | Attending: Internal Medicine | Admitting: Internal Medicine

## 2022-11-07 ENCOUNTER — Encounter: Payer: Self-pay | Admitting: Intensive Care

## 2022-11-07 DIAGNOSIS — Z888 Allergy status to other drugs, medicaments and biological substances status: Secondary | ICD-10-CM

## 2022-11-07 DIAGNOSIS — R570 Cardiogenic shock: Secondary | ICD-10-CM | POA: Diagnosis not present

## 2022-11-07 DIAGNOSIS — J9811 Atelectasis: Secondary | ICD-10-CM | POA: Diagnosis present

## 2022-11-07 DIAGNOSIS — M170 Bilateral primary osteoarthritis of knee: Secondary | ICD-10-CM | POA: Diagnosis present

## 2022-11-07 DIAGNOSIS — I132 Hypertensive heart and chronic kidney disease with heart failure and with stage 5 chronic kidney disease, or end stage renal disease: Principal | ICD-10-CM | POA: Diagnosis present

## 2022-11-07 DIAGNOSIS — I5043 Acute on chronic combined systolic (congestive) and diastolic (congestive) heart failure: Secondary | ICD-10-CM | POA: Diagnosis present

## 2022-11-07 DIAGNOSIS — Q845 Enlarged and hypertrophic nails: Secondary | ICD-10-CM

## 2022-11-07 DIAGNOSIS — G9341 Metabolic encephalopathy: Secondary | ICD-10-CM | POA: Diagnosis present

## 2022-11-07 DIAGNOSIS — N186 End stage renal disease: Secondary | ICD-10-CM | POA: Diagnosis present

## 2022-11-07 DIAGNOSIS — X58XXXA Exposure to other specified factors, initial encounter: Secondary | ICD-10-CM | POA: Diagnosis present

## 2022-11-07 DIAGNOSIS — I509 Heart failure, unspecified: Secondary | ICD-10-CM | POA: Diagnosis present

## 2022-11-07 DIAGNOSIS — Z992 Dependence on renal dialysis: Secondary | ICD-10-CM | POA: Diagnosis not present

## 2022-11-07 DIAGNOSIS — I2489 Other forms of acute ischemic heart disease: Secondary | ICD-10-CM | POA: Diagnosis present

## 2022-11-07 DIAGNOSIS — D649 Anemia, unspecified: Secondary | ICD-10-CM | POA: Diagnosis present

## 2022-11-07 DIAGNOSIS — E1122 Type 2 diabetes mellitus with diabetic chronic kidney disease: Secondary | ICD-10-CM | POA: Diagnosis present

## 2022-11-07 DIAGNOSIS — Z1152 Encounter for screening for COVID-19: Secondary | ICD-10-CM | POA: Diagnosis not present

## 2022-11-07 DIAGNOSIS — J9601 Acute respiratory failure with hypoxia: Secondary | ICD-10-CM

## 2022-11-07 DIAGNOSIS — I469 Cardiac arrest, cause unspecified: Secondary | ICD-10-CM | POA: Diagnosis not present

## 2022-11-07 DIAGNOSIS — J9602 Acute respiratory failure with hypercapnia: Secondary | ICD-10-CM | POA: Diagnosis present

## 2022-11-07 DIAGNOSIS — I5021 Acute systolic (congestive) heart failure: Secondary | ICD-10-CM | POA: Diagnosis not present

## 2022-11-07 DIAGNOSIS — I878 Other specified disorders of veins: Secondary | ICD-10-CM | POA: Diagnosis present

## 2022-11-07 DIAGNOSIS — N2581 Secondary hyperparathyroidism of renal origin: Secondary | ICD-10-CM | POA: Diagnosis present

## 2022-11-07 DIAGNOSIS — Z89421 Acquired absence of other right toe(s): Secondary | ICD-10-CM

## 2022-11-07 DIAGNOSIS — R7989 Other specified abnormal findings of blood chemistry: Secondary | ICD-10-CM

## 2022-11-07 DIAGNOSIS — I161 Hypertensive emergency: Secondary | ICD-10-CM | POA: Diagnosis present

## 2022-11-07 DIAGNOSIS — Z8249 Family history of ischemic heart disease and other diseases of the circulatory system: Secondary | ICD-10-CM

## 2022-11-07 DIAGNOSIS — M549 Dorsalgia, unspecified: Secondary | ICD-10-CM | POA: Diagnosis present

## 2022-11-07 DIAGNOSIS — E785 Hyperlipidemia, unspecified: Secondary | ICD-10-CM | POA: Diagnosis present

## 2022-11-07 DIAGNOSIS — Z789 Other specified health status: Secondary | ICD-10-CM | POA: Insufficient documentation

## 2022-11-07 DIAGNOSIS — F17210 Nicotine dependence, cigarettes, uncomplicated: Secondary | ICD-10-CM | POA: Diagnosis present

## 2022-11-07 DIAGNOSIS — Z6835 Body mass index (BMI) 35.0-35.9, adult: Secondary | ICD-10-CM

## 2022-11-07 DIAGNOSIS — I959 Hypotension, unspecified: Secondary | ICD-10-CM | POA: Diagnosis not present

## 2022-11-07 DIAGNOSIS — S91312A Laceration without foreign body, left foot, initial encounter: Secondary | ICD-10-CM | POA: Diagnosis present

## 2022-11-07 DIAGNOSIS — Z79899 Other long term (current) drug therapy: Secondary | ICD-10-CM

## 2022-11-07 DIAGNOSIS — E11649 Type 2 diabetes mellitus with hypoglycemia without coma: Secondary | ICD-10-CM | POA: Diagnosis not present

## 2022-11-07 DIAGNOSIS — G894 Chronic pain syndrome: Secondary | ICD-10-CM | POA: Diagnosis present

## 2022-11-07 DIAGNOSIS — D631 Anemia in chronic kidney disease: Secondary | ICD-10-CM | POA: Diagnosis present

## 2022-11-07 DIAGNOSIS — Z515 Encounter for palliative care: Secondary | ICD-10-CM | POA: Diagnosis not present

## 2022-11-07 DIAGNOSIS — S81811A Laceration without foreign body, right lower leg, initial encounter: Secondary | ICD-10-CM | POA: Diagnosis present

## 2022-11-07 DIAGNOSIS — Z885 Allergy status to narcotic agent status: Secondary | ICD-10-CM

## 2022-11-07 DIAGNOSIS — I255 Ischemic cardiomyopathy: Secondary | ICD-10-CM | POA: Diagnosis present

## 2022-11-07 LAB — CBC WITH DIFFERENTIAL/PLATELET
Abs Immature Granulocytes: 0.02 10*3/uL (ref 0.00–0.07)
Basophils Absolute: 0 10*3/uL (ref 0.0–0.1)
Basophils Relative: 0 %
Eosinophils Absolute: 0.2 10*3/uL (ref 0.0–0.5)
Eosinophils Relative: 2 %
HCT: 36.5 % — ABNORMAL LOW (ref 39.0–52.0)
Hemoglobin: 10.9 g/dL — ABNORMAL LOW (ref 13.0–17.0)
Immature Granulocytes: 0 %
Lymphocytes Relative: 9 %
Lymphs Abs: 0.8 10*3/uL (ref 0.7–4.0)
MCH: 26.5 pg (ref 26.0–34.0)
MCHC: 29.9 g/dL — ABNORMAL LOW (ref 30.0–36.0)
MCV: 88.6 fL (ref 80.0–100.0)
Monocytes Absolute: 0.8 10*3/uL (ref 0.1–1.0)
Monocytes Relative: 8 %
Neutro Abs: 7.8 10*3/uL — ABNORMAL HIGH (ref 1.7–7.7)
Neutrophils Relative %: 81 %
Platelets: 214 10*3/uL (ref 150–400)
RBC: 4.12 MIL/uL — ABNORMAL LOW (ref 4.22–5.81)
RDW: 17.9 % — ABNORMAL HIGH (ref 11.5–15.5)
WBC: 9.7 10*3/uL (ref 4.0–10.5)
nRBC: 0 % (ref 0.0–0.2)

## 2022-11-07 LAB — RESP PANEL BY RT-PCR (RSV, FLU A&B, COVID)  RVPGX2
Influenza A by PCR: NEGATIVE
Influenza B by PCR: NEGATIVE
Resp Syncytial Virus by PCR: NEGATIVE
SARS Coronavirus 2 by RT PCR: NEGATIVE

## 2022-11-07 LAB — BLOOD GAS, VENOUS
Acid-Base Excess: 0.5 mmol/L (ref 0.0–2.0)
Bicarbonate: 27 mmol/L (ref 20.0–28.0)
O2 Saturation: 38.5 %
Patient temperature: 37
pCO2, Ven: 50 mmHg (ref 44–60)
pH, Ven: 7.34 (ref 7.25–7.43)
pO2, Ven: 31 mmHg — CL (ref 32–45)

## 2022-11-07 LAB — TROPONIN I (HIGH SENSITIVITY)
Troponin I (High Sensitivity): 59 ng/L — ABNORMAL HIGH (ref <18)
Troponin I (High Sensitivity): 68 ng/L — ABNORMAL HIGH (ref <18)

## 2022-11-07 LAB — BASIC METABOLIC PANEL
Anion gap: 17 — ABNORMAL HIGH (ref 5–15)
BUN: 64 mg/dL — ABNORMAL HIGH (ref 8–23)
CO2: 23 mmol/L (ref 22–32)
Calcium: 8.8 mg/dL — ABNORMAL LOW (ref 8.9–10.3)
Chloride: 95 mmol/L — ABNORMAL LOW (ref 98–111)
Creatinine, Ser: 7.11 mg/dL — ABNORMAL HIGH (ref 0.61–1.24)
GFR, Estimated: 8 mL/min — ABNORMAL LOW (ref >60)
Glucose, Bld: 97 mg/dL (ref 70–99)
Potassium: 4.6 mmol/L (ref 3.5–5.1)
Sodium: 135 mmol/L (ref 135–145)

## 2022-11-07 LAB — HEPATIC FUNCTION PANEL
ALT: 17 U/L (ref 0–44)
AST: 26 U/L (ref 15–41)
Albumin: 3.8 g/dL (ref 3.5–5.0)
Alkaline Phosphatase: 153 U/L — ABNORMAL HIGH (ref 38–126)
Bilirubin, Direct: 0.7 mg/dL — ABNORMAL HIGH (ref 0.0–0.2)
Indirect Bilirubin: 1 mg/dL — ABNORMAL HIGH (ref 0.3–0.9)
Total Bilirubin: 1.7 mg/dL — ABNORMAL HIGH (ref 0.3–1.2)
Total Protein: 10.1 g/dL — ABNORMAL HIGH (ref 6.5–8.1)

## 2022-11-07 LAB — SALICYLATE LEVEL: Salicylate Lvl: <7 mg/dL — ABNORMAL LOW (ref 7.0–30.0)

## 2022-11-07 LAB — LIPASE, BLOOD: Lipase: 61 U/L — ABNORMAL HIGH (ref 11–51)

## 2022-11-07 LAB — BRAIN NATRIURETIC PEPTIDE: B Natriuretic Peptide: 1299.7 pg/mL — ABNORMAL HIGH (ref 0.0–100.0)

## 2022-11-07 LAB — PROTIME-INR
INR: 1.2 (ref 0.8–1.2)
Prothrombin Time: 15.3 seconds — ABNORMAL HIGH (ref 11.4–15.2)

## 2022-11-07 LAB — ACETAMINOPHEN LEVEL: Acetaminophen (Tylenol), Serum: 11 ug/mL (ref 10–30)

## 2022-11-07 LAB — AMMONIA: Ammonia: 39 umol/L — ABNORMAL HIGH (ref 9–35)

## 2022-11-07 MED ORDER — HALOPERIDOL LACTATE 5 MG/ML IJ SOLN
INTRAMUSCULAR | Status: AC
  Administered 2022-11-07: 5 mg
  Filled 2022-11-07: qty 1

## 2022-11-07 MED ORDER — NITROGLYCERIN 2 % TD OINT: 0.5000 [in_us] | TOPICAL_OINTMENT | Freq: Once | TRANSDERMAL | Status: DC

## 2022-11-07 MED ORDER — MORPHINE SULFATE (PF) 4 MG/ML IV SOLN
4.0000 mg | Freq: Once | INTRAVENOUS | Status: AC
  Administered 2022-11-07: 4 mg via INTRAVENOUS
  Filled 2022-11-07: qty 1

## 2022-11-07 MED ORDER — NITROGLYCERIN 2 % TD OINT
1.0000 [in_us] | TOPICAL_OINTMENT | Freq: Once | TRANSDERMAL | Status: AC
  Administered 2022-11-07: 1 [in_us] via TOPICAL
  Filled 2022-11-07: qty 1

## 2022-11-07 MED ORDER — LABETALOL HCL 5 MG/ML IV SOLN: 20.0000 mg | Freq: Once | INTRAVENOUS | Status: DC

## 2022-11-07 MED ORDER — NITROGLYCERIN 2 % TD OINT: 1.0000 [in_us] | TOPICAL_OINTMENT | Freq: Four times a day (QID) | TRANSDERMAL | Status: DC

## 2022-11-07 MED ORDER — IOHEXOL 350 MG/ML SOLN: 100.0000 mL | Freq: Once | INTRAVENOUS | Status: DC | PRN

## 2022-11-07 MED ORDER — FUROSEMIDE 10 MG/ML IJ SOLN
40.0000 mg | Freq: Once | INTRAMUSCULAR | Status: AC
  Administered 2022-11-07: 40 mg via INTRAVENOUS
  Filled 2022-11-07: qty 4
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
## 2022-11-07 NOTE — ED Provider Notes (Signed)
[facility] Provider Note    Event Date/Time   First MD Initiated Contact with Patient 11/19/2022 1655     (approximate)   History   Chief Complaint: Weakness and Laceration   HPI  Steve Vance is a 62 y.o. male with a history of ESRD on hemodialysis Monday Wednesday Friday, CHF, diabetes, hypertension, morbid obesity who comes the ED complaining of shortness of breath.  Patient denies any alcohol or drug use today, but obviously smells of marijuana.  Wife does report him drinking alcohol.  Last dialysis session was 2 days ago on Friday.  Patient reports dyspnea on exertion and orthopnea.    Patient also reports bleeding from his right leg after scratching the area earlier today.  Denies any falls or serious trauma.     Physical Exam   Triage Vital Signs: ED Triage Vitals  Enc Vitals Group     BP      Pulse      Resp      Temp      Temp src      SpO2      Weight      Height      Head Circumference      Peak Flow      Pain Score      Pain Loc      Pain Edu?      Excl. in [place]?     Most recent vital signs: Vitals:   11/25/2022 2030 11/12/2022 2200  BP: (!) 152/80 (!) 161/83  Pulse: (!) 123 (!) 123  Resp: (!) 27 (!) 25  Temp:    SpO2: 92% 97%    General: Awake, no distress.   CV:  Good peripheral perfusion.  Regular rate and rhythm Resp:  Tachypneic.  Lungs clear to auscultation bilaterally Abd:  Abdomen moderately distended.  Soft but with upper abdominal tenderness and tympany to percussion. Other:  Brawny skin thickening bilateral lower extremities consistent with chronic venous stasis dermatitis.  Area of recent bleeding on the right lower leg in this area of skin changes.   ED Results / Procedures / Treatments   Labs (all labs ordered are listed, but only abnormal results are displayed) Labs Reviewed  BRAIN NATRIURETIC PEPTIDE - Abnormal; Notable for the following components:      Result Value   B Natriuretic Peptide 1,299.7 (*)     All other components within normal limits  BASIC METABOLIC PANEL - Abnormal; Notable for the following components:   Chloride 95 (*)    BUN 64 (*)    Creatinine, Ser 7.11 (*)    Calcium 8.8 (*)    GFR, Estimated 8 (*)    Anion gap 17 (*)    All other components within normal limits  HEPATIC FUNCTION PANEL - Abnormal; Notable for the following components:   Total Protein 10.1 (*)    Alkaline Phosphatase 153 (*)    Total Bilirubin 1.7 (*)    Bilirubin, Direct 0.7 (*)    Indirect Bilirubin 1.0 (*)    All other components within normal limits  LIPASE, BLOOD - Abnormal; Notable for the following components:   Lipase 61 (*)    All other components within normal limits  SALICYLATE LEVEL - Abnormal; Notable for the following components:   Salicylate Lvl [id number] (*)    All other components within normal limits  CBC WITH DIFFERENTIAL/PLATELET - Abnormal; Notable for the following components:   RBC 4.12 (*)    Hemoglobin 10.9 (*)  HCT 36.5 (*)    MCHC 29.9 (*)    RDW 17.9 (*)    Neutro Abs 7.8 (*)    All other components within normal limits  PROTIME-INR - Abnormal; Notable for the following components:   Prothrombin Time 15.3 (*)    All other components within normal limits  BLOOD GAS, VENOUS - Abnormal; Notable for the following components:   pO2, Ven 31 (*)    All other components within normal limits  AMMONIA - Abnormal; Notable for the following components:   Ammonia 39 (*)    All other components within normal limits  TROPONIN I (HIGH SENSITIVITY) - Abnormal; Notable for the following components:   Troponin I (High Sensitivity) 59 (*)    All other components within normal limits  TROPONIN I (HIGH SENSITIVITY) - Abnormal; Notable for the following components:   Troponin I (High Sensitivity) 68 (*)    All other components within normal limits  RESP PANEL BY RT-PCR (RSV, FLU A&B, COVID)  [id number]  ACETAMINOPHEN LEVEL  ETHANOL  URINE DRUG SCREEN, QUALITATIVE ([facility] ONLY)   URINALYSIS, ROUTINE W REFLEX MICROSCOPIC     EKG Interpreted by me Sinus tachycardia, rate of 103.  Normal axis, first-degree AV block.  Normal QRS ST segments and T waves.  No ischemic changes.   RADIOLOGY Chest x-ray interpreted by me, appears unremarkable.  Radiology report reviewed   PROCEDURES:  .Critical Care  Performed by: Carrie Mew, MD Authorized by: Carrie Mew, MD   Critical care provider statement:    Critical care time (minutes):  33   Critical care time was exclusive of:  Separately billable procedures and treating other patients   Critical care was necessary to treat or prevent imminent or life-threatening deterioration of the following conditions:  Respiratory failure, CNS failure or compromise and renal failure   Critical care was time spent personally by me on the following activities:  Development of treatment plan with patient or surrogate, discussions with consultants, evaluation of patient's response to treatment, examination of patient, obtaining history from patient or surrogate, ordering and performing treatments and interventions, ordering and review of laboratory studies, ordering and review of radiographic studies, pulse oximetry, re-evaluation of patient's condition and review of old charts   Care discussed with: admitting provider      MEDICATIONS ORDERED IN ED: Medications  iohexol (OMNIPAQUE) 350 MG/ML injection 100 mL (has no administration in time range)  morphine (PF) 4 MG/ML injection 4 mg (4 mg Intravenous Given 11/09/2022 1721)  furosemide (LASIX) injection 40 mg (40 mg Intravenous Given 11/26/2022 2103)  haloperidol lactate (HALDOL) 5 MG/ML injection (5 mg  Given 12/02/2022 2129)  nitroGLYCERIN (NITROGLYN) 2 % ointment 1 inch (1 inch Topical Given 11/25/2022 2154)     IMPRESSION / MDM / [place] / ED COURSE  I reviewed the triage vital signs and the nursing notes.  DDx: Volume overload, uremia, electrolyte abnormality, NSTEMI,  pulmonary edema, pleural effusion, hyperammonemia, CO2 narcosis, marijuana intoxication, alcohol intoxication, viral illness/COVID/flu, bowel obstruction  Patient's presentation is most consistent with acute presentation with potential threat to life or bodily function.  Patient presents with generalized abdominal pain, complaining of pain at the right lower leg.  He has an area of chronic skin changes with venous stasis dermatitis but exhibits recent bleeding but no active bleeding currently.  There is a small superficial wound on the lateral aspect of the right foot which does not appear to be infected or bleeding, no other wounds.  Will check  labs and x-rays.  ----------------------------------------- 9:01 PM on 11/11/2022 ----------------------------------------- Labs and x-rays overall unremarkable.  Patient previously had symptomatic improvement with Nitropaste and Lasix.  Will attempt to diurese here in the ED.  Hospitalist will evaluate as well.  ----------------------------------------- 10:35 PM on 11/21/2022 ----------------------------------------- Dr. Damita Dunnings will admit.  Patient became very delirious and agitated and was given 5 mg IV Haldol.  He was started on BiPAP for respiratory comfort.       FINAL CLINICAL IMPRESSION(S) / ED DIAGNOSES   Final diagnoses:  Acute respiratory failure with hypoxia (HCC)  ESRD on hemodialysis ([place])  Acute on chronic congestive heart failure, unspecified heart failure type ([place])     Rx / DC Orders   ED Discharge Orders     None        Note:  This document was prepared using Dragon voice recognition software and may include unintentional dictation errors.   Carrie Mew, MD 11/19/2022 2235

## 2022-11-07 NOTE — ED Notes (Signed)
Pt was attempting to cough up sputum and was not able to get it up. RN attmepted to suction pt, pt than swat at the RN and said "NO!". Pt family attemped to talk pt into being suctioned. RN advised pt that his sputum needs to come out. Pt still refused.

## 2022-11-07 NOTE — ED Triage Notes (Addendum)
Patient presents with laceration to right lower leg. Bleeding noted. Intermittent mumbling of speech. Dialysis patient. Fistula left arm. Patient reports some SOB.  Wife and patient are unsure when his last treatment was.   Wife reports patient drinks alcohol and smokes cigarettes.   Patient reports smoking marijuana today

## 2022-11-07 NOTE — ED Notes (Signed)
Pt refuses to lay flat for x ray.

## 2022-11-08 ENCOUNTER — Other Ambulatory Visit: Payer: Self-pay | Admitting: Internal Medicine

## 2022-11-08 ENCOUNTER — Inpatient Hospital Stay: Payer: 59

## 2022-11-08 ENCOUNTER — Inpatient Hospital Stay (HOSPITAL_COMMUNITY)
Admit: 2022-11-08 | Discharge: 2022-11-08 | Disposition: A | Discharge status: Home / Self Care | Payer: 59 | Attending: Internal Medicine | Admitting: Internal Medicine

## 2022-11-08 DIAGNOSIS — S91312A Laceration without foreign body, left foot, initial encounter: Secondary | ICD-10-CM

## 2022-11-08 DIAGNOSIS — I5043 Acute on chronic combined systolic (congestive) and diastolic (congestive) heart failure: Secondary | ICD-10-CM

## 2022-11-08 DIAGNOSIS — I5021 Acute systolic (congestive) heart failure: Secondary | ICD-10-CM | POA: Diagnosis not present

## 2022-11-08 DIAGNOSIS — G9341 Metabolic encephalopathy: Secondary | ICD-10-CM

## 2022-11-08 DIAGNOSIS — I878 Other specified disorders of veins: Secondary | ICD-10-CM

## 2022-11-08 LAB — CBC WITH DIFFERENTIAL/PLATELET
Abs Immature Granulocytes: 0.89 10*3/uL — ABNORMAL HIGH (ref 0.00–0.07)
Basophils Absolute: 0 10*3/uL (ref 0.0–0.1)
Basophils Relative: 0 %
Eosinophils Absolute: 0 10*3/uL (ref 0.0–0.5)
Eosinophils Relative: 0 %
HCT: 31.2 % — ABNORMAL LOW (ref 39.0–52.0)
Hemoglobin: 9.3 g/dL — ABNORMAL LOW (ref 13.0–17.0)
Immature Granulocytes: 7 %
Lymphocytes Relative: 9 %
Lymphs Abs: 1.2 10*3/uL (ref 0.7–4.0)
MCH: 26.6 pg (ref 26.0–34.0)
MCHC: 29.8 g/dL — ABNORMAL LOW (ref 30.0–36.0)
MCV: 89.4 fL (ref 80.0–100.0)
Monocytes Absolute: 0.8 10*3/uL (ref 0.1–1.0)
Monocytes Relative: 6 %
Neutro Abs: 10.2 10*3/uL — ABNORMAL HIGH (ref 1.7–7.7)
Neutrophils Relative %: 78 %
Platelets: 205 10*3/uL (ref 150–400)
RBC: 3.49 MIL/uL — ABNORMAL LOW (ref 4.22–5.81)
RDW: 18 % — ABNORMAL HIGH (ref 11.5–15.5)
WBC: 13.1 10*3/uL — ABNORMAL HIGH (ref 4.0–10.5)
nRBC: 0 % (ref 0.0–0.2)

## 2022-11-08 LAB — COMPREHENSIVE METABOLIC PANEL
ALT: 15 U/L (ref 0–44)
ALT: 30 U/L (ref 0–44)
AST: 27 U/L (ref 15–41)
AST: 82 U/L — ABNORMAL HIGH (ref 15–41)
Albumin: 3 g/dL — ABNORMAL LOW (ref 3.5–5.0)
Albumin: 2.7 g/dL — ABNORMAL LOW (ref 3.5–5.0)
Alkaline Phosphatase: 106 U/L (ref 38–126)
Alkaline Phosphatase: 80 U/L (ref 38–126)
Anion gap: 19 — ABNORMAL HIGH (ref 5–15)
Anion gap: 19 — ABNORMAL HIGH (ref 5–15)
BUN: 72 mg/dL — ABNORMAL HIGH (ref 8–23)
BUN: 49 mg/dL — ABNORMAL HIGH (ref 8–23)
CO2: 18 mmol/L — ABNORMAL LOW (ref 22–32)
CO2: 20 mmol/L — ABNORMAL LOW (ref 22–32)
Calcium: 8.1 mg/dL — ABNORMAL LOW (ref 8.9–10.3)
Calcium: 8.4 mg/dL — ABNORMAL LOW (ref 8.9–10.3)
Chloride: 99 mmol/L (ref 98–111)
Chloride: 97 mmol/L — ABNORMAL LOW (ref 98–111)
Creatinine, Ser: 8.12 mg/dL — ABNORMAL HIGH (ref 0.61–1.24)
Creatinine, Ser: 5.47 mg/dL — ABNORMAL HIGH (ref 0.61–1.24)
GFR, Estimated: 7 mL/min — ABNORMAL LOW (ref >60)
GFR, Estimated: 11 mL/min — ABNORMAL LOW (ref >60)
Glucose, Bld: 76 mg/dL (ref 70–99)
Glucose, Bld: 66 mg/dL — ABNORMAL LOW (ref 70–99)
Potassium: 4.4 mmol/L (ref 3.5–5.1)
Potassium: 4.2 mmol/L (ref 3.5–5.1)
Sodium: 136 mmol/L (ref 135–145)
Sodium: 136 mmol/L (ref 135–145)
Total Bilirubin: 2.2 mg/dL — ABNORMAL HIGH (ref 0.3–1.2)
Total Bilirubin: 1.9 mg/dL — ABNORMAL HIGH (ref 0.3–1.2)
Total Protein: 8.2 g/dL — ABNORMAL HIGH (ref 6.5–8.1)
Total Protein: 7.5 g/dL (ref 6.5–8.1)

## 2022-11-08 LAB — GLUCOSE, CAPILLARY
Glucose-Capillary: 23 mg/dL — CL (ref 70–99)
Glucose-Capillary: 19 mg/dL — CL (ref 70–99)

## 2022-11-08 LAB — BLOOD GAS, ARTERIAL
Acid-Base Excess: 7.8 mmol/L — ABNORMAL HIGH (ref 0.0–2.0)
Acid-base deficit: 6.8 mmol/L — ABNORMAL HIGH (ref 0.0–2.0)
Bicarbonate: 17.7 mmol/L — ABNORMAL LOW (ref 20.0–28.0)
Bicarbonate: 36.1 mmol/L — ABNORMAL HIGH (ref 20.0–28.0)
Expiratory PAP: 6 cmH2O
FIO2: 50 %
FIO2: 0.6 %
Inspiratory PAP: 12 cmH2O
MECHVT: 500 mL
O2 Saturation: 99.3 %
O2 Saturation: 80.7 %
PEEP: 5 cmH2O
Patient temperature: 37
Patient temperature: 37
RATE: 16 resp/min
pCO2 arterial: 32 mmHg (ref 32–48)
pCO2 arterial: 67 mmHg (ref 32–48)
pH, Arterial: 7.35 (ref 7.35–7.45)
pH, Arterial: 7.34 — ABNORMAL LOW (ref 7.35–7.45)
pO2, Arterial: 120 mmHg — ABNORMAL HIGH (ref 83–108)
pO2, Arterial: 54 mmHg — ABNORMAL LOW (ref 83–108)

## 2022-11-08 LAB — CBC
HCT: 32.6 % — ABNORMAL LOW (ref 39.0–52.0)
Hemoglobin: 10 g/dL — ABNORMAL LOW (ref 13.0–17.0)
MCH: 26.2 pg (ref 26.0–34.0)
MCHC: 30.7 g/dL (ref 30.0–36.0)
MCV: 85.6 fL (ref 80.0–100.0)
Platelets: 209 10*3/uL (ref 150–400)
RBC: 3.81 MIL/uL — ABNORMAL LOW (ref 4.22–5.81)
RDW: 17.7 % — ABNORMAL HIGH (ref 11.5–15.5)
WBC: 11.5 10*3/uL — ABNORMAL HIGH (ref 4.0–10.5)
nRBC: 0 % (ref 0.0–0.2)

## 2022-11-08 LAB — ECHOCARDIOGRAM COMPLETE
AR max vel: 2.38 cm2
AV Area VTI: 2.79 cm2
AV Area mean vel: 2.4 cm2
AV Mean grad: 6 mmHg
AV Peak grad: 9.4 mmHg
Ao pk vel: 1.53 m/s
Area-P 1/2: 3.93 cm2
Height: 71 in
S' Lateral: 3.3 cm
Weight: 3712 oz

## 2022-11-08 LAB — ETHANOL: Alcohol, Ethyl (B): <10 mg/dL (ref <10)

## 2022-11-08 LAB — HEPATITIS B SURFACE ANTIGEN: Hepatitis B Surface Ag: NONREACTIVE

## 2022-11-08 LAB — AMMONIA: Ammonia: 53 umol/L — ABNORMAL HIGH (ref 9–35)

## 2022-11-08 LAB — MRSA NEXT GEN BY PCR, NASAL: MRSA by PCR Next Gen: DETECTED — AB

## 2022-11-08 MED ORDER — NOREPINEPHRINE 4 MG/250ML-% IV SOLN
0.0000 ug/min | INTRAVENOUS | Status: DC
  Administered 2022-11-08: 4 ug/min via INTRAVENOUS

## 2022-11-08 MED ORDER — EPINEPHRINE HCL 5 MG/250ML IV SOLN IN NS
0.5000 ug/min | INTRAVENOUS | Status: DC
  Administered 2022-11-08: 1 ug/min via INTRAVENOUS
  Filled 2022-11-08: qty 250

## 2022-11-08 MED ORDER — LORAZEPAM 2 MG/ML IJ SOLN
0.5000 mg | Freq: Once | INTRAMUSCULAR | Status: AC
  Administered 2022-11-08: 0.5 mg via INTRAVENOUS
  Filled 2022-11-08: qty 1

## 2022-11-08 MED ORDER — ANTICOAGULANT SODIUM CITRATE 4% (200MG/5ML) IV SOLN: 5.0000 mL | Status: DC | PRN

## 2022-11-08 MED ORDER — ACETAMINOPHEN 325 MG PO TABS: 650.0000 mg | ORAL_TABLET | Freq: Four times a day (QID) | ORAL | Status: DC | PRN

## 2022-11-08 MED ORDER — ACETAMINOPHEN 650 MG RE SUPP: 650.0000 mg | Freq: Four times a day (QID) | RECTAL | Status: DC | PRN

## 2022-11-08 MED ORDER — CALCIUM CHLORIDE 10 % IV SOLN
1.0000 g | Freq: Once | INTRAVENOUS | Status: AC
  Administered 2022-11-08: 1 g via INTRAVENOUS

## 2022-11-08 MED ORDER — ALTEPLASE 2 MG IJ SOLR: 2.0000 mg | Freq: Once | INTRAMUSCULAR | Status: DC | PRN

## 2022-11-08 MED ORDER — ONDANSETRON HCL 4 MG/2ML IJ SOLN: 4.0000 mg | Freq: Four times a day (QID) | INTRAMUSCULAR | Status: DC | PRN

## 2022-11-08 MED ORDER — HEPARIN SODIUM (PORCINE) 1000 UNIT/ML DIALYSIS: 1000.0000 [IU] | INTRAMUSCULAR | Status: DC | PRN

## 2022-11-08 MED ORDER — AMLODIPINE BESYLATE 10 MG PO TABS
10.0000 mg | ORAL_TABLET | Freq: Every day | ORAL | Status: DC
  Filled 2022-11-08: qty 1

## 2022-11-08 MED ORDER — ONDANSETRON HCL 4 MG PO TABS: 4.0000 mg | ORAL_TABLET | Freq: Every day | ORAL | Status: DC | PRN

## 2022-11-08 MED ORDER — CARVEDILOL 6.25 MG PO TABS
6.2500 mg | ORAL_TABLET | Freq: Two times a day (BID) | ORAL | Status: DC
  Filled 2022-11-08: qty 1

## 2022-11-08 MED ORDER — NITROGLYCERIN 2 % TD OINT
0.5000 [in_us] | TOPICAL_OINTMENT | Freq: Four times a day (QID) | TRANSDERMAL | Status: DC
  Administered 2022-11-08: 0.5 [in_us] via TOPICAL
  Filled 2022-11-08: qty 1

## 2022-11-08 MED ORDER — NEPRO/CARBSTEADY PO LIQD: 237.0000 mL | Freq: Two times a day (BID) | ORAL | Status: DC

## 2022-11-08 MED ORDER — CHLORHEXIDINE GLUCONATE CLOTH 2 % EX PADS
6.0000 | MEDICATED_PAD | Freq: Every day | CUTANEOUS | Status: DC
  Administered 2022-11-08 (×2): 6 via TOPICAL

## 2022-11-08 MED ORDER — SODIUM BICARBONATE 8.4 % IV SOLN
INTRAVENOUS | Status: AC
  Filled 2022-11-08: qty 50

## 2022-11-08 MED ORDER — SODIUM BICARBONATE 8.4 % IV SOLN
100.0000 meq | Freq: Once | INTRAVENOUS | Status: DC
  Filled 2022-11-08: qty 50

## 2022-11-08 MED ORDER — DEXTROSE 10 % IV SOLN: INTRAVENOUS | Status: DC

## 2022-11-08 MED ORDER — ATROPINE SULFATE 1 MG/10ML IJ SOSY
1.0000 mg | PREFILLED_SYRINGE | Freq: Once | INTRAMUSCULAR | Status: AC
  Administered 2022-11-08: 1 mg via INTRAVENOUS

## 2022-11-08 MED ORDER — ATORVASTATIN CALCIUM 10 MG PO TABS
10.0000 mg | ORAL_TABLET | Freq: Every day | ORAL | Status: DC
  Filled 2022-11-08: qty 1

## 2022-11-08 MED ORDER — CALCIUM ACETATE (PHOS BINDER) 667 MG PO CAPS
667.0000 mg | ORAL_CAPSULE | Freq: Three times a day (TID) | ORAL | Status: DC
  Filled 2022-11-08 (×3): qty 1

## 2022-11-08 MED ORDER — ALBUTEROL SULFATE (2.5 MG/3ML) 0.083% IN NEBU: 2.5000 mg | INHALATION_SOLUTION | RESPIRATORY_TRACT | Status: DC | PRN

## 2022-11-08 MED ORDER — FUROSEMIDE 10 MG/ML IJ SOLN
40.0000 mg | Freq: Two times a day (BID) | INTRAMUSCULAR | Status: DC
  Administered 2022-11-08: 40 mg via INTRAVENOUS
  Filled 2022-11-08: qty 4

## 2022-11-08 MED ORDER — FAMOTIDINE 20 MG PO TABS
20.0000 mg | ORAL_TABLET | Freq: Every day | ORAL | Status: DC
  Filled 2022-11-08: qty 1

## 2022-11-08 MED ORDER — HALOPERIDOL LACTATE 5 MG/ML IJ SOLN
1.0000 mg | Freq: Four times a day (QID) | INTRAMUSCULAR | Status: DC | PRN
  Administered 2022-11-08: 1 mg via INTRAVENOUS
  Filled 2022-11-08: qty 1

## 2022-11-08 MED ORDER — ONDANSETRON HCL 4 MG PO TABS: 4.0000 mg | ORAL_TABLET | Freq: Four times a day (QID) | ORAL | Status: DC | PRN

## 2022-11-08 MED ORDER — PANTOPRAZOLE SODIUM 40 MG IV SOLR: 40.0000 mg | INTRAVENOUS | Status: DC

## 2022-11-08 MED ORDER — HEPARIN SODIUM (PORCINE) 5000 UNIT/ML IJ SOLN
5000.0000 [IU] | Freq: Three times a day (TID) | INTRAMUSCULAR | Status: DC
  Administered 2022-11-08: 5000 [IU] via SUBCUTANEOUS
  Filled 2022-11-08: qty 1

## 2022-11-08 MED ORDER — LIDOCAINE HCL (PF) 1 % IJ SOLN: 5.0000 mL | INTRAMUSCULAR | Status: DC | PRN

## 2022-11-08 MED ORDER — PENTAFLUOROPROP-TETRAFLUOROETH EX AERO: 1.0000 | INHALATION_SPRAY | CUTANEOUS | Status: DC | PRN

## 2022-11-08 MED ORDER — LIDOCAINE-PRILOCAINE 2.5-2.5 % EX CREA: 1.0000 | TOPICAL_CREAM | CUTANEOUS | Status: DC | PRN

## 2022-11-08 MED ORDER — LISINOPRIL 20 MG PO TABS
40.0000 mg | ORAL_TABLET | Freq: Every day | ORAL | Status: DC
  Filled 2022-11-08: qty 4

## 2022-11-08 MED ORDER — NOREPINEPHRINE 4 MG/250ML-% IV SOLN
INTRAVENOUS | Status: AC
  Filled 2022-11-08: qty 250

## 2022-11-08 MED ORDER — ISOSORBIDE MONONITRATE ER 30 MG PO TB24
30.0000 mg | ORAL_TABLET | Freq: Every day | ORAL | Status: DC
  Filled 2022-11-08: qty 1

## 2022-11-09 LAB — PATHOLOGIST SMEAR REVIEW

## 2022-11-09 LAB — GLUCOSE, CAPILLARY: Glucose-Capillary: 89 mg/dL (ref 70–99)

## 2022-11-10 LAB — HEPATITIS B SURFACE ANTIBODY, QUANTITATIVE: Hep B S AB Quant (Post): <3.1 m[IU]/mL — ABNORMAL LOW (ref >9.9)

## 2022-12-06 NOTE — Progress Notes (Signed)
Trialysis catheter was placed without incident, 1 attempt, able to successfully cannulate Left IJ under US guidance on 1st attempt.  Positive blood return of dark red blood with slow trickle (no pulsatile flow).  Guidewire was visualized in IJ with [place] prior to threading in catheter.     Shortly after IJ placed, noted that pt bradied down and became asystole at 17:04.  CODE BLUE was called and chest compressions and ACLS immediately begun.  Please see code sheet for full details.    Darel Hong, AGACNP-BC [facility] Prefer epic messenger for cross cover needs If after hours, please call E-link
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
## 2022-12-06 NOTE — Significant Event (Addendum)
1545 Responded to overhead page of rapid response, then was changed to code blue. Pt currently in dialysis. On arrival, effective cpr being done, pt ventilated by bvm. Pt placed on cardiac defib. Appeared to be in asystole. CPR providers changed out frequently. Dr. Vicente Males EDP present, RRT, ED code response team.Provider and dialysis staff and respiratory therapy 1547; 1 mg epinephrine IV push/flush 1548: 1 calcium IV push/flush 1550 '1mg'$  IV push epinephrine/flush after 1550 Pulse check, no pulse 1550 CBG 23 1551 1 amp d50 IV push/flush followed 1551:Pt hyperoxygenated, then intubated on 1st attempt with glidescope. Intubated with size 8 ETT. No difficulty in passing tube verbalized. Tube passing through cords visualized, rise and fall of chest noted, bilateral chest breath sounds, no epigastric sounds heard post intubation. Positive color change of ETCO2 detector. Tube secured. Continued with ventilation for rise and fall of  chest. Bleeding noted in ETT. 1553 1 epinephrine flush followed CPR continues.  1554:+pulse palpated, ST/SR noted on monitor.  Pt monitored, when deemed stable for transport, pt was transported by bed to ICU with RT managing airway, ICU charge RN monitoring pt, pt remained on cardiac monitor defib. Medications for emergency use available as needed.Pt safely transported to ICU 2. ICU RNs present in room on patient's arrival. On arrival, no change noted in patient status. Airway remains intact, palpable pulse and rhythm noted on monitor. Asked staff to recheck cbg. Intensivists aware patient on unit. During code in dialysis, cpr was ongoing and effectiveness monitored throughout code. CPR paused for pulse checks, and intubation. Immediately resumed after those events. No interruption or delay in CPR or ventilations.

## 2022-12-06 NOTE — Assessment & Plan Note (Addendum)
Uncertain etiology, possible delirium Will get CT head but patient very orthopneic and unable to lie flat for CT Will get CT pending improvement with BiPAP Will keep n.p.o. Aspiration precautions and neurologic checks Haldol as needed

## 2022-12-06 NOTE — Assessment & Plan Note (Signed)
No acute concerns

## 2022-12-06 NOTE — Assessment & Plan Note (Signed)
Alcohol use Patient drinks 40 ounces of beer daily according to girlfriend at bedside though no prior documented history of alcohol use.  Unable to verify with patient at this  LFTs normal We will monitor ammonia level

## 2022-12-06 NOTE — Progress Notes (Signed)
[facility]  ROUNDING NOTE   Subjective:   Steve Vance  is a 62 year old male with past medical conditions including type 2 diabetes, hypertension, dyslipidemia, systolic heart failure with EF 45 to 50% and end-stage renal disease on hemodialysis. Patient presents to the emergency department for shortness of breath. Patient has been admitted for CHF (congestive heart failure) ([place]) [I50.9] Acute respiratory failure with hypoxia ([place]) [J96.01] ESRD on hemodialysis ([place]) [N18.6, Z99.2] Acute on chronic congestive heart failure, unspecified heart failure type ([place]) [I50.9]  Patient is known to our practice and receives outpatient dialysis treatments at [facility] on a MWF schedule, supervised by Dr. Candiss Norse.  Patient presented to the emergency department with complaints of shortness of breath.  Patient is seen and evaluated at bedside.  Patient currently somnolent on BiPAP.  Fianc at bedside.  Review of outpatient dialysis documentation shows no recent missed treatments.  Ellene Route is unaware of noncompliance with fluid or diet restrictions.  Mild lower extremity edema noted.  Labs on ED arrival unremarkable for renal patient.  Troponin 59 with a BNP 1299.  Hemoglobin 10.9.  Respiratory panel for influenza, COVID-19, and RSV.  Chest x-ray shows opacities in the right lower lobe.  We have been consulted to manage dialysis needs during this admission.   Objective:  Vital signs in last 24 hours:  Temp:  [97.5 F (36.4 C)-102.3 F (39.1 C)] 102.3 F (39.1 C) Nov 11, 2022 1144) Pulse Rate:  [84-123] 84 November 11, 2022 1530) Resp:  [8-33] 8 11/11/2022 1530) BP: (69-252)/(47-166) 69/47 11-Nov-2022 1530) SpO2:  [92 %-100 %] 92 % 11-11-22 1530) FiO2 (%):  [28 %-50 %] 28 % 11-11-22 0754) Weight:  [105.2 kg-118.2 kg] 116.7 kg 11/11/22 1148)  Weight change:  Filed Weights   12/03/2022 1709 11-11-2022 0800 Nov 11, 2022 1148  Weight: 105.2 kg 118.2 kg 116.7 kg    Intake/Output: No intake/output data  recorded.   Intake/Output this shift:  No intake/output data recorded.  Physical Exam: General: Ill-appearing  Head: Normocephalic, atraumatic.  Eyes: Anicteric  Lungs:  Rhonchorous throughout, BiPAP  Heart: Regular rate and rhythm  Abdomen:  Soft, nontender, obese  Extremities: 1+ peripheral edema.  Neurologic: Somnolent   Skin: No lesions  Access: Left aVF    Basic Metabolic Panel: Recent Labs  Lab 11/05/2022 1715 Nov 11, 2022 0310  NA 135 136  K 4.6 4.4  CL 95* 99  CO2 23 18*  GLUCOSE 97 76  BUN 64* 72*  CREATININE 7.11* 8.12*  CALCIUM 8.8* 8.1*    Liver Function Tests: Recent Labs  Lab 11/06/2022 1715 2022/11/11 0310  AST 26 27  ALT 17 15  ALKPHOS 153* 106  BILITOT 1.7* 2.2*  PROT 10.1* 8.2*  ALBUMIN 3.8 3.0*   Recent Labs  Lab 11/06/2022 1715  LIPASE 61*   Recent Labs  Lab 11/16/2022 1715 11-Nov-2022 0310  AMMONIA 39* 53*    CBC: Recent Labs  Lab 11/13/2022 1715 11/11/22 0310  WBC 9.7 11.5*  NEUTROABS 7.8*  --   HGB 10.9* 10.0*  HCT 36.5* 32.6*  MCV 88.6 85.6  PLT 214 209    Cardiac Enzymes: No results for input(s): "CKTOTAL", "CKMB", "CKMBINDEX", "TROPONINI" in the last 168 hours.  BNP: Invalid input(s): "POCBNP"  CBG: Recent Labs  Lab 11-11-2022 1551  GLUCAP 23*    Microbiology: Results for orders placed or performed during the hospital encounter of 11/09/2022  Resp panel by RT-PCR (RSV, Flu A&B, Covid) Anterior Nasal Swab     Status: None   Collection Time: 11/26/2022  5:08 PM   Specimen: Anterior Nasal Swab  Result Value Ref Range Status   SARS Coronavirus 2 by RT PCR NEGATIVE NEGATIVE Final    Comment: (NOTE) SARS-CoV-2 target nucleic acids are NOT DETECTED.  The SARS-CoV-2 RNA is generally detectable in upper respiratory specimens during the acute phase of infection. The lowest concentration of SARS-CoV-2 viral copies this assay can detect is 138 copies/mL. A negative result does not preclude SARS-Cov-2 infection and should not be  used as the sole basis for treatment or other patient management decisions. A negative result may occur with  improper specimen collection/handling, submission of specimen other than nasopharyngeal swab, presence of viral mutation(s) within the areas targeted by this assay, and inadequate number of viral copies(<138 copies/mL). A negative result must be combined with clinical observations, patient history, and epidemiological information. The expected result is Negative.  Fact Sheet for Patients:  EntrepreneurPulse.com.au  Fact Sheet for Healthcare Providers:  IncredibleEmployment.be  This test is no t yet approved or cleared by the [place] FDA and  has been authorized for detection and/or diagnosis of SARS-CoV-2 by FDA under an Emergency Use Authorization (EUA). This EUA will remain  in effect (meaning this test can be used) for the duration of the COVID-19 declaration under Section 564(b)(1) of the Act, 21 U.S.C.section 360bbb-3(b)(1), unless the authorization is terminated  or revoked sooner.       Influenza A by PCR NEGATIVE NEGATIVE Final   Influenza B by PCR NEGATIVE NEGATIVE Final    Comment: (NOTE) The Xpert Xpress SARS-CoV-2/FLU/RSV plus assay is intended as an aid in the diagnosis of influenza from Nasopharyngeal swab specimens and should not be used as a sole basis for treatment. Nasal washings and aspirates are unacceptable for Xpert Xpress SARS-CoV-2/FLU/RSV testing.  Fact Sheet for Patients: EntrepreneurPulse.com.au  Fact Sheet for Healthcare Providers: IncredibleEmployment.be  This test is not yet approved or cleared by the [place] FDA and has been authorized for detection and/or diagnosis of SARS-CoV-2 by FDA under an Emergency Use Authorization (EUA). This EUA will remain in effect (meaning this test can be used) for the duration of the COVID-19 declaration under Section  564(b)(1) of the Act, 21 U.S.C. section 360bbb-3(b)(1), unless the authorization is terminated or revoked.     Resp Syncytial Virus by PCR NEGATIVE NEGATIVE Final    Comment: (NOTE) Fact Sheet for Patients: EntrepreneurPulse.com.au  Fact Sheet for Healthcare Providers: IncredibleEmployment.be  This test is not yet approved or cleared by the [place] FDA and has been authorized for detection and/or diagnosis of SARS-CoV-2 by FDA under an Emergency Use Authorization (EUA). This EUA will remain in effect (meaning this test can be used) for the duration of the COVID-19 declaration under Section 564(b)(1) of the Act, 21 U.S.C. section 360bbb-3(b)(1), unless the authorization is terminated or revoked.  Performed at [facility], [address] [place], [place] [address]     Coagulation Studies: Recent Labs    12/01/2022 1715  LABPROT 15.3*  INR 1.2    Urinalysis: No results for input(s): "COLORURINE", "LABSPEC", "PHURINE", "GLUCOSEU", "HGBUR", "BILIRUBINUR", "KETONESUR", "PROTEINUR", "UROBILINOGEN", "NITRITE", "LEUKOCYTESUR" in the last 72 hours.  Invalid input(s): "APPERANCEUR"    Imaging: ECHOCARDIOGRAM COMPLETE  Result Date: 12-02-2022    ECHOCARDIOGRAM REPORT   Patient Name:   Steve Vance Date of Exam: 12-02-22 Medical Rec #:  [id number]     Height:       71.0 in Accession #:    [id number]    Weight:  232.0 lb Date of Birth:  14-Feb-1961    BSA:          2.246 m Patient Age:    58 years      BP:           124/66 mmHg Patient Gender: M             HR:           108 bpm. Exam Location:  [facility] Procedure: 2D Echo, Cardiac Doppler and Color Doppler Indications:     CHF-acute systolic [id number]  History:         Patient has prior history of Echocardiogram examinations, most                  recent 03/07/2022. CHF; Risk Factors:Diabetes, Dyslipidemia and                  Hypertension.  Sonographer:     Sherrie Sport Referring Phys:   [id number] Athena Masse Diagnosing Phys: Kathlyn Sacramento MD IMPRESSIONS  1. Left ventricular ejection fraction, by estimation, is 55 to 60%. The left ventricle has normal function. The left ventricle has no regional wall motion abnormalities. There is moderate left ventricular hypertrophy. Left ventricular diastolic parameters are indeterminate.  2. Right ventricular systolic function is normal. The right ventricular size is normal. Tricuspid regurgitation signal is inadequate for assessing PA pressure.  3. Left atrial size was severely dilated.  4. Right atrial size was mildly dilated.  5. The mitral valve is abnormal. Trivial mitral valve regurgitation. No evidence of mitral stenosis. Moderate mitral annular calcification.  6. The aortic valve is normal in structure. Aortic valve regurgitation is not visualized. Aortic valve sclerosis/calcification is present, without any evidence of aortic stenosis. FINDINGS  Left Ventricle: Left ventricular ejection fraction, by estimation, is 55 to 60%. The left ventricle has normal function. The left ventricle has no regional wall motion abnormalities. The left ventricular internal cavity size was normal in size. There is  moderate left ventricular hypertrophy. Left ventricular diastolic parameters are indeterminate. Right Ventricle: The right ventricular size is normal. No increase in right ventricular wall thickness. Right ventricular systolic function is normal. Tricuspid regurgitation signal is inadequate for assessing PA pressure. The tricuspid regurgitant velocity is 2.09 m/s, and with an assumed right atrial pressure of 5 mmHg, the estimated right ventricular systolic pressure is [id number] mmHg. Left Atrium: Left atrial size was severely dilated. Right Atrium: Right atrial size was mildly dilated. Pericardium: There is no evidence of pericardial effusion. Mitral Valve: The mitral valve is abnormal. There is moderate thickening of the mitral valve leaflet(s). Moderate mitral  annular calcification. Trivial mitral valve regurgitation. No evidence of mitral valve stenosis. Tricuspid Valve: The tricuspid valve is normal in structure. Tricuspid valve regurgitation is not demonstrated. No evidence of tricuspid stenosis. Aortic Valve: The aortic valve is normal in structure. Aortic valve regurgitation is not visualized. Aortic valve sclerosis/calcification is present, without any evidence of aortic stenosis. Aortic valve mean gradient measures 6.0 mmHg. Aortic valve peak  gradient measures 9.4 mmHg. Aortic valve area, by VTI measures 2.79 cm. Pulmonic Valve: The pulmonic valve was normal in structure. Pulmonic valve regurgitation is not visualized. No evidence of pulmonic stenosis. Aorta: The aortic root is normal in size and structure. Venous: The inferior vena cava was not well visualized. IAS/Shunts: No atrial level shunt detected by color flow Doppler.  LEFT VENTRICLE PLAX 2D LVIDd:         4.60 cm  Diastology LVIDs:         3.30 cm   LV e' medial:    13.60 cm/s LV PW:         1.50 cm   LV E/e' medial:  11.4 LV IVS:        1.40 cm   LV e' lateral:   9.46 cm/s LVOT diam:     2.00 cm   LV E/e' lateral: 16.4 LV SV:         59 LV SV Index:   26 LVOT Area:     3.14 cm  RIGHT VENTRICLE RV Basal diam:  5.80 cm RV Mid diam:    4.10 cm RV S prime:     10.30 cm/s TAPSE (M-mode): 1.7 cm LEFT ATRIUM              Index        RIGHT ATRIUM           Index LA diam:        5.10 cm  2.27 cm/m   RA Area:     24.00 cm LA Vol (A2C):   119.0 ml 52.99 ml/m  RA Volume:   76.30 ml  33.97 ml/m LA Vol (A4C):   129.0 ml 57.44 ml/m LA Biplane Vol: 125.0 ml 55.66 ml/m  AORTIC VALVE AV Area (Vmax):    2.38 cm AV Area (Vmean):   2.40 cm AV Area (VTI):     2.79 cm AV Vmax:           153.00 cm/s AV Vmean:          110.000 cm/s AV VTI:            0.212 m AV Peak Grad:      9.4 mmHg AV Mean Grad:      6.0 mmHg LVOT Vmax:         116.00 cm/s LVOT Vmean:        84.000 cm/s LVOT VTI:          0.188 m LVOT/AV VTI  ratio: 0.89  AORTA Ao Root diam: 4.00 cm MITRAL VALVE                TRICUSPID VALVE MV Area (PHT): 3.93 cm     TR Peak grad:   17.5 mmHg MV Decel Time: 193 msec     TR Vmax:        209.00 cm/s MV E velocity: 155.00 cm/s                             SHUNTS                             Systemic VTI:  0.19 m                             Systemic Diam: 2.00 cm Kathlyn Sacramento MD Electronically signed by Kathlyn Sacramento MD Signature Date/Time: 13-Nov-2022/2:15:20 PM    Final    US Venous Img Lower Bilateral (DVT)  Result Date: November 13, 2022 CLINICAL DATA:  62 year old male with history of anterior right-sided leg pain. EXAM: BILATERAL LOWER EXTREMITY VENOUS DOPPLER ULTRASOUND TECHNIQUE: Gray-scale sonography with graded compression, as well as color Doppler and duplex ultrasound were performed to evaluate the lower extremity deep venous systems from the level of the common femoral vein and including the  common femoral, femoral, profunda femoral, popliteal and calf veins including the posterior tibial, peroneal and gastrocnemius veins when visible. The superficial great saphenous vein was also interrogated. Spectral Doppler was utilized to evaluate flow at rest and with distal augmentation maneuvers in the common femoral, femoral and popliteal veins. COMPARISON:  None Available. FINDINGS: RIGHT LOWER EXTREMITY Common Femoral Vein: No evidence of thrombus. Normal compressibility, respiratory phasicity and response to augmentation. Saphenofemoral Junction: No evidence of thrombus. Normal compressibility and flow on color Doppler imaging. Profunda Femoral Vein: No evidence of thrombus. Normal compressibility and flow on color Doppler imaging. Femoral Vein: No evidence of thrombus. Normal compressibility, respiratory phasicity and response to augmentation. Popliteal Vein: No evidence of thrombus. Normal compressibility, respiratory phasicity and response to augmentation. Calf Veins: No evidence of thrombus. Normal compressibility  and flow on color Doppler imaging. Superficial Great Saphenous Vein: No evidence of thrombus. Normal compressibility. Venous Reflux:  None. Other Findings:  None. LEFT LOWER EXTREMITY Common Femoral Vein: No evidence of thrombus. Normal compressibility, respiratory phasicity and response to augmentation. Saphenofemoral Junction: No evidence of thrombus. Normal compressibility and flow on color Doppler imaging. Profunda Femoral Vein: No evidence of thrombus. Normal compressibility and flow on color Doppler imaging. Femoral Vein: No evidence of thrombus. Normal compressibility, respiratory phasicity and response to augmentation. Popliteal Vein: No evidence of thrombus. Normal compressibility, respiratory phasicity and response to augmentation. Calf Veins: No evidence of thrombus. Normal compressibility and flow on color Doppler imaging. Superficial Great Saphenous Vein: No evidence of thrombus. Normal compressibility. Venous Reflux:  None. Other Findings:  None. IMPRESSION: No evidence of deep venous thrombosis in either lower extremity. Electronically Signed   By: Vinnie Langton M.D.   On: 11-15-2022 05:38   DG Abdomen 1 View  Result Date: 11/15/2022 CLINICAL DATA:  Shortness of breath and distention EXAM: ABDOMEN - 1 VIEW COMPARISON:  None Available. FINDINGS: Of note the study is very limited as per the x-ray technologist the patient could not tolerate the study. Only a single portable upright view of the upper abdomen is obtained. Gas is seen in nondilated loops of bowel. Motion. No obvious free air. Additional workup as clinically directed IMPRESSION: Limited x-ray as above. Nonspecific bowel gas pattern of the upper abdomen. No obvious free air. Motion Electronically Signed   By: Jill Side M.D.   On: 11/27/2022 18:09   DG Chest Portable 1 View  Result Date: 11/06/2022 CLINICAL DATA:  Shortness of breath, leg laceration EXAM: PORTABLE CHEST 1 VIEW COMPARISON:  10/26/2022 FINDINGS: Increased  interstitial markings, chronic, without frank interstitial edema. Mild right lower lobe opacity, favoring atelectasis. Skin fold along the lateral right hemithorax. No pleural effusion or pneumothorax. Cardiomegaly. IMPRESSION: Chronic interstitial markings.  No frank interstitial edema. Mild right lower lobe opacity, favoring atelectasis. Electronically Signed   By: Julian Hy M.D.   On: 11/12/2022 17:29     Medications:    anticoagulant sodium citrate     dextrose      amLODipine  10 mg Oral Daily   atorvastatin  10 mg Oral Daily   calcium acetate  667 mg Oral TID WC   carvedilol  6.25 mg Oral BID WC   Chlorhexidine Gluconate Cloth  6 each Topical Q0600   famotidine  20 mg Oral Daily   feeding supplement (NEPRO CARB STEADY)  237 mL Oral BID BM   furosemide  40 mg Intravenous BID   heparin  5,000 Units Subcutaneous Q8H   isosorbide mononitrate  30 mg Oral Daily  lisinopril  40 mg Oral Daily   nitroGLYCERIN  0.5 inch Topical Q6H   acetaminophen **OR** acetaminophen, albuterol, alteplase, anticoagulant sodium citrate, haloperidol lactate, heparin, iohexol, lidocaine (PF), lidocaine-prilocaine, ondansetron **OR** ondansetron (ZOFRAN) IV, pentafluoroprop-tetrafluoroeth  Assessment/ Plan:  [name] is a 62 y.o.  male with past medical conditions including type 2 diabetes, hypertension, dyslipidemia, systolic heart failure with EF 45 to 50% and end-stage renal disease on hemodialysis. Patient presents to the emergency department for shortness of breath. Patient has been admitted for  CHF (congestive heart failure) ([place]) [I50.9] Acute respiratory failure with hypoxia ([place]) [J96.01] ESRD on hemodialysis ([place]) [N18.6, Z99.2] Acute on chronic congestive heart failure, unspecified heart failure type ([place]) [I50.9]   End-stage renal disease on hemodialysis.  Will maintain outpatient schedule if possible.  Patient scheduled for dialysis today, UF 2 L as tolerated.  Next treatment  scheduled for Wednesday.   2. Anemia of chronic kidney disease Lab Results  Component Value Date   HGB 10.0 (L) 04-Dec-2022    Hemoglobin within goal range.  Patient receives Mircera at outpatient clinic.  3. Secondary Hyperparathyroidism: with outpatient labs: PTH 759, phosphorus 11.5, calcium 8.6 on 04/22/22.    Lab Results  Component Value Date   PTH 301 (H) 09/24/2020   CALCIUM 8.1 (L) 12/04/2022   CAION 1.21 06/27/2019   PHOS 8.3 (H) 09/28/2022    Phosphorus remains elevated.  Patient receives calcium acetate with meals.  4.  Hypertension with chronic kidney disease.  Patient currently prescribed amlodipine, carvedilol, isosorbide, and lisinopril.  Currently receiving these medications.  5.  Acute on chronic combined systolic and diastolic heart failure.  Echo shows EF 55 to 60% with trivial mitral valve regurgitation.   LOS: 1   03-30-20244:05 PM

## 2022-12-06 NOTE — Progress Notes (Signed)
*  PRELIMINARY RESULTS* Echocardiogram 2D Echocardiogram has been performed.  Steve Vance 11-23-22, 10:00 AM

## 2022-12-06 NOTE — Progress Notes (Signed)
   2022-11-24 1600  Spiritual Encounters  Type of Visit Initial  Care provided to: Pt not available  Conversation partners present during encounter Nurse;Physician  Referral source Code page  Reason for visit Code  OnCall Visit Yes   Chaplain responded to RRT. Upon arrival care team was checking for pulse. No pulse was detected and therefore code blue was initiated. Chaplain asked about family/friends with patient. Patient came from room 258 and Chaplain went to there and no family/friends were present. Patient was transported to ICU. Chaplain checked in with ICU team and they will page Chaplain if family/friends arrive.

## 2022-12-06 NOTE — Assessment & Plan Note (Signed)
Wound care.

## 2022-12-06 NOTE — Progress Notes (Signed)
1615- Arrival to ICU and placed on monitor. Pt in SR, with BP 72/48. Dr Mortimer Fries and Tana Conch, NP notified of pt arrival. Pt remains hypotensive with BP 74/50 - Levophed to be started, and 2 amps of Bicarb given. Pt's bilateral lung sounds coarse and diminished. Levophed to be titrated to achieve MAP goal of >65. 1628- Calcium given per verbal order by Tana Conch, NP at bedside. Pt now bradycardic into 50s, verbal orders given for 1 mg of Atropine. Plan to start Epinephrine drip and to place central and arterial lines. 1652 - 250 NS bolus given, per verbal orders by Tana Conch, Np for continued hypotension. 1639 - Time out given for emergent placement of central line. Pt's bradycardia now having widened complex - doppler pulse still present. 1702- Completion of central line placement, by Tana Conch, NP. [phone] - pt asystole on monitor and without a pulse by doppler. Compressions initiated, and code blue called. Code sheet complete. Time of death called by Dr Mortimer Fries at [phone] with family at bedside. Family given time with pt.

## 2022-12-06 NOTE — Progress Notes (Signed)
Pt transported to 258 on the Bipap without incident. Pt remains on Bipap and is tol well. Report given to floor RT.

## 2022-12-06 NOTE — Assessment & Plan Note (Addendum)
Hypertensive emergency Acute respiratory failure Patient presents with confusion but develops acute respiratory distress while in the ED, tachypneic, orthopneic requiring BiPAP Troponin 59 and BNP 1299.  VBG unremarkable.  Chest x-ray shows mild right lower lobe opacity favoring atelectasis.  No white cell count or fever Continue Lasix and Nitropaste Continue BiPAP and wean as tolerated Daily weights with intake and output monitoring Echocardiogram in the a.m.  Last EF 45 to 50% July 2023 Expecting improvement with dialysis in the a.m. Nephrology consulted for dialysis

## 2022-12-06 NOTE — Progress Notes (Addendum)
Patient with decreasing B/P, Nephrology NP in unit, made aware, UF on hold.   B/P continues to decrease, Nephrology NP advises to cease fluid removal for remaining of treatment. Last reading 89/54 (65) HR 88.

## 2022-12-06 NOTE — Progress Notes (Signed)
Nephrology present during [place].  During last 30 to 45 minutes of treatment, patient hypotensive.  UF turned off for remainder of treatment.  Due to persistent hypotension, treatment terminated early.  Heart rate decreased from low 100s to 55-60.  Patient remained on BiPAP but was unresponsive.  Rapid response initially called and then turned to [place].  Patient given 2-200 mL normal saline bolus' to treat hypotension.  Awaiting glucose check when CODE BLUE called.
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
## 2022-12-06 NOTE — Progress Notes (Signed)
Progress Note   Patient: Prestin Kuperman [id number] DOB: [birth year] DOA: 11/27/2022     1 DOS: the patient was seen and examined on 18-Nov-2022   Brief hospital course: Emani Afable is a 62 y.o. male with medical history significant of ESRD on HD TTS, anemia of CKD, baseline 8-11, HTN, DM ,combined systolic and diastolic dysfunction CHF with last known LVEF of 45 - 50% July 2023, obesity, hypertension, anxiety disorder admitted twice in the past 2 months for CHF exacerbation, signing out AMA within 24 hours on both occasions, compliant with hemodialysis, according to girlfriend at bedside, who was brought to the ED with shortness of breath, confusion and a laceration of the right lower leg.  Girlfriend states he was in his usual state of health when he went to dialysis on Friday and then on the day of arrival he seemed a bit altered and complained of pain in his right leg and then he cut his leg but she is uncertain how he sustained.  She states he was drinking a 40 ounce of beer but is unable to quantify how much he really drinks.  ED course and data review: On arrival, tachycardic and tachypneic with BP 232/111 with O2 sat 96% on room air however patient had increased work of breathing, tachypneic to the high 20s. Labs: VBG with pH 7.34 and pCO2 50.  CBC at baseline.  BMP at baseline for dialysis but with elevated anion gap of 17.  Lipase 61 with normal transaminases, alk phos 153 and total bili 1.7.  Troponin 59 and BNP 1299.  Ammonia 39. Respiratory viral panel negative. Chest x-ray shows chronic interstitial markings, no frank interstitial edema and mild right lower lobe opacity favoring atelectasis.  Abdominal x-ray unremarkable Patient was treated with Lasix and Nitropaste for possible CHF exacerbation/fluid overload.  Patient was orthopneic and tachypneic and was placed on BiPAP.  Hospitalist consulted for admission.   Assessment and Plan:  Acute on chronic combined systolic and diastolic CHF  (congestive heart failure) (HCC) Hypertensive emergency Acute respiratory failure S/p cardiac arrest Patient presents with confusion but develops acute respiratory distress while in the ED, tachypneic, orthopneic requiring BiPAP Troponin 59 and BNP 1299.  VBG unremarkable.  Chest x-ray shows mild right lower lobe opacity favoring atelectasis.  No white cell count or fever Continue Lasix and Nitropaste Patient was still on BiPAP this morning during assessment Was taken to dialysis and during dialysis stated having decreased BP rapid response was called and later no pulse felt and CODE BLUE was called. Return of spontaneous circulation achieved and patient intubated and transferred to the ICU after about 10 to 12 minutes Critical care attending on board and case discussed Continue ventilator management Daily weights with intake and output monitoring Echocardiogram in the a.m.  Last EF 45 to 50% July 2023 Repeat echo has been performed we will follow-up on results Nephrology team on board Prognosis remains extremely poor and this has been discussed with patient's family     Cardiogenic shock requiring vasopressor support Continue management as above Continue vasopressors Intensivist on board and have taken over management  Hypoglycemia Patient noted to have glucose of 20 during code however glucose levels have been normal before. Currently on dextrose drip  Acute metabolic encephalopathy Uncertain etiology, possible delirium Will get CT head but patient very orthopneic and unable to lie flat for CT Will get CT pending improvement with BiPAP Will keep n.p.o. Aspiration precautions and neurologic checks    Acute metabolic encephalopathy likely secondary  to acute respiratory failure as well as Increased ammonia level Alcohol use Patient drinks 40 ounces of beer daily according to girlfriend at bedside though no prior documented history of alcohol use.  Unable to verify with patient  at this  LFTs normal We will monitor ammonia level   Laceration of left foot Wound care   Lower extremity venous stasis No acute concerns   End-stage renal disease on hemodialysis [facility]) Nephrology consult for continuation of dialysis   Anemia due to chronic kidney disease Hemoglobin stable          Advance Care Planning:   Code Status: Full Code    Physical Exam: Vitals:   11/24/2022 1545 November 24, 2022 1554 11-24-2022 1615 24-Nov-2022 1630  BP: (!) 158/92  (!) 72/48 (!) 61/43  Pulse:   94   Resp: (!) '29  16 19  '$ Temp:      TempSrc:      SpO2:  (!) 74% 97%   Weight:      Height:   '5\' 11"'$  (1.803 m)    Physical Exam Vitals reviewed and documented separately Constitutional:      General: Acutely ill looking on BiPAP HENT:     Head: Normocephalic and atraumatic.  Cardiovascular:     Rate and Rhythm: Regular rhythm.    Heart sounds: Normal heart sounds.  Pulmonary:     Effort: No obvious distress    Breath sounds: Normal breath sounds.     Comments: Bipap.  Abdominal:     Palpations: Abdomen is soft.     Tenderness: There is no abdominal tenderness.  Musculoskeletal:     Right lower leg: Edema present.     Left lower leg: Edema present.  Neurological:     General: No focal deficit present.     Mental Status: He is lethargic.  Data Reviewed: Current laboratory results CBC and BMP reviewed by me  Family Communication: Discussed with patient's sister over the phone as well as girlfriend at bedside  Disposition: Status is: Inpatient   Planned Discharge Destination: Pending clinical course  Time spent: 50 minutes  Author: Verline Lema, MD 2022-11-24 5:28 PM  For on call review [email or web address].

## 2022-12-06 NOTE — Progress Notes (Signed)
Dr. Mortimer Fries was discussing prognosis and issues with patient's family. Notified patient had arrested again. Dr. Mortimer Fries suggested family be bedside. Family present during code and when patient died.

## 2022-12-06 NOTE — Progress Notes (Signed)
Approximately 0015am.  Patient on unit . Transfer from ED. A/O to self, combative fighting staff, verbally abusive, he is continuous attempting to take off Bipap mask off. Fiance is at bedside unable to redirect. MD Damita Dunnings notified, see new orders. PRN effective.  0300 AM Patient restlessness returns, Verbal abuse, Hitting, Swinging at staff, taking Bipap off.  I had to sit at bedside to assist in keeping mask on and patient in bed.Medical history of ESRD on HD CKD, HTN, DM systolic and diastolic dysfunction CHF. Patient admitted with CHF exacerbation/fluid overload.  Jone Baseman, NP ordered Ativan and a sitter '@0430'$  AM  0437 AM bipap is off patient broke it in pieces Respiratory notified. 0442 AM Ativan given Bipap back on. 0530 AM Sitter relieve this RN.
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
## 2022-12-06 NOTE — Assessment & Plan Note (Signed)
Nephrology consult for continuation of dialysis

## 2022-12-06 NOTE — Progress Notes (Signed)
Initially rapid response was called on pt while in dialysis. After my arrival to dialysis unit pt was found unresponsive and pulseless. CPR started (see code sheet). Pt transferred to ICU 2 at this time.

## 2022-12-06 NOTE — Significant Event (Signed)
1704: Code Blue called. Dr. Mortimer Fries present on unit. Witnessed arrest, CPR initiated, bvm respiratory support. 1705: 1 epinephrine given/flush followed. Asystole 1707 Pulse check, no pulse, CPR restarted. 1707 1 amp bicarb pushed, flush followed 1708 1 epi given, flush followed 1709 1 bicarb given flush followed 1710 CBG 19. 1 Amp d50 pushed. Flush followed 1710+ pulse, faint thready. Agonal 44 1713 lost pulse. 1710: Family present at bedside and at time of death.  12-08-1711; family requested efforts be terminated. 1713 Time of death pronounced by [name].
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
## 2022-12-06 NOTE — IPAL (Signed)
  Interdisciplinary Goals of Care Family Meeting   Date carried out: 11/28/2022  Location of the meeting: Bedside  Member's involved: Physician, Bedside Registered Nurse, and Family Member or next of kin   GOALS OF CARE DISCUSSION  The Clinical status was relayed to family in detail- Sisters and nephew at bedside   Updated and notified of patients medical condition- Explained to family course of therapy and the modalities  Signs of brain damage  Patient with CODE BLUE AND ACUTE CARDIAC ARREST A SECOND TIME FAMILY AT BEDSIDE, SISTERS WITNESSED CARDIAC AARREST THE SISTERS YELLED OUT "STOP AND LET HIM GO..GOD HAS HIM NOW"   Patient pronounced dead at 51 PM  Additional CC time 66 mins   Steve Vance [name], M.D.  [name]  Medical Director [place] Director [facility] Cardio-Pulmonary Department

## 2022-12-06 NOTE — Progress Notes (Addendum)
Pt recently arrived to ICU following CODE BLUE in dialysis.  Pt critically ill , unresponsive on no sedation, hypotensive (SBP 50's) and bradycardic (HR 40-50's), and hypoglycemic despite Levophed & D10 infusions being initiated. Pt with only 1 PIV, will place Trialysis catheter emergently for immediate IV access and to able to do CRRT should the pt stabilize in future.   Before procedure started, pt with bradycardia (rhythm widening out), hypotension. And hypoxic (vent titrated up to 100% FiO2 and 12 PEEP. Following meds given prior to start of procedure.    16:28: 2 amps bicarb 16:30: 1 amp calcium 16:32 Atropine 1 amp Epinephrine gtt initiated 16:52: 250 cc NS bolus   Darel Hong, AGACNP-BC [facility] Prefer epic messenger for cross cover needs If after hours, please call E-link

## 2022-12-06 NOTE — Progress Notes (Signed)
30 min left on tx, pt was hypotensive. NP was notified and UF turned off. After returning the blood, pt was non-responsive. Rrt was called and turned to code blue.

## 2022-12-06 NOTE — Assessment & Plan Note (Signed)
Hemoglobin stable 

## 2022-12-06 NOTE — Procedures (Signed)
Central Venous Catheter Insertion Procedure Note  Steve Vance  OH:[id number]  1960/09/15  Date:12-05-2022  Time:5:18 PM   Provider Performing:Bernell Haynie D Dewaine Conger   Procedure: Insertion of Non-tunneled Central Venous Catheter([id number])with US guidance [id number])    Indication(s) Medication administration, Difficult access, and Hemodialysis  Consent Unable to obtain consent due to emergent nature of procedure.  Anesthesia Topical only with 1% lidocaine   Timeout Verified patient identification, verified procedure, site/side was marked, verified correct patient position, special equipment/implants available, medications/allergies/relevant history reviewed, required imaging and test results available.  Sterile Technique Maximal sterile technique including full sterile barrier drape, hand hygiene, sterile gown, sterile gloves, mask, hair covering, sterile ultrasound probe cover (if used).  Procedure Description Area of catheter insertion was cleaned with chlorhexidine and draped in sterile fashion.   With real-time ultrasound guidance a HD catheter was placed into the left internal jugular vein.  Nonpulsatile blood flow and easy flushing noted in all ports.  The catheter was sutured in place and sterile dressing applied.  Complications/Tolerance None; patient tolerated the procedure well. Chest X-ray is ordered to verify placement for internal jugular or subclavian cannulation.  Chest x-ray is not ordered for femoral cannulation.  EBL Minimal  Specimen(s) None   Line secured at the 20 cm mark. BIOPATCH applied to the insertion site.   Darel Hong, AGACNP-BC [facility] Prefer epic messenger for cross cover needs If after hours, please call E-link

## 2022-12-06 DEATH — deceased
